# Patient Record
Sex: Female | Born: 1955 | Race: White | Hispanic: No | Marital: Married | Smoking: Never smoker
Health system: Southern US, Community
[De-identification: ages and names within clinical notes are randomized; demographics above are authoritative.]

## PROBLEM LIST (undated history)

## (undated) DIAGNOSIS — K219 Gastro-esophageal reflux disease without esophagitis: Secondary | ICD-10-CM

## (undated) DIAGNOSIS — K5790 Diverticulosis of intestine, part unspecified, without perforation or abscess without bleeding: Secondary | ICD-10-CM

## (undated) DIAGNOSIS — M1990 Unspecified osteoarthritis, unspecified site: Secondary | ICD-10-CM

## (undated) DIAGNOSIS — D759 Disease of blood and blood-forming organs, unspecified: Secondary | ICD-10-CM

## (undated) DIAGNOSIS — C8590 Non-Hodgkin lymphoma, unspecified, unspecified site: Secondary | ICD-10-CM

## (undated) DIAGNOSIS — C801 Malignant (primary) neoplasm, unspecified: Secondary | ICD-10-CM

## (undated) DIAGNOSIS — I82C11 Acute embolism and thrombosis of right internal jugular vein: Secondary | ICD-10-CM

## (undated) DIAGNOSIS — E663 Overweight: Secondary | ICD-10-CM

## (undated) DIAGNOSIS — K2970 Gastritis, unspecified, without bleeding: Secondary | ICD-10-CM

## (undated) DIAGNOSIS — R5383 Other fatigue: Secondary | ICD-10-CM

## (undated) DIAGNOSIS — J392 Other diseases of pharynx: Secondary | ICD-10-CM

## (undated) DIAGNOSIS — R002 Palpitations: Secondary | ICD-10-CM

## (undated) DIAGNOSIS — D696 Thrombocytopenia, unspecified: Secondary | ICD-10-CM

## (undated) DIAGNOSIS — I499 Cardiac arrhythmia, unspecified: Secondary | ICD-10-CM

## (undated) DIAGNOSIS — R079 Chest pain, unspecified: Secondary | ICD-10-CM

## (undated) DIAGNOSIS — K209 Esophagitis, unspecified: Secondary | ICD-10-CM

## (undated) DIAGNOSIS — R252 Cramp and spasm: Secondary | ICD-10-CM

## (undated) DIAGNOSIS — R61 Generalized hyperhidrosis: Secondary | ICD-10-CM

## (undated) DIAGNOSIS — IMO0001 Reserved for inherently not codable concepts without codable children: Secondary | ICD-10-CM

## (undated) DIAGNOSIS — R000 Tachycardia, unspecified: Secondary | ICD-10-CM

## (undated) HISTORY — DX: Overweight: E66.3

## (undated) HISTORY — DX: Gastritis, unspecified, without bleeding: K29.70

## (undated) HISTORY — DX: Gastro-esophageal reflux disease without esophagitis: K21.9

## (undated) HISTORY — PX: PORTACATH PLACEMENT: SHX2246

## (undated) HISTORY — DX: Thrombocytopenia, unspecified: D69.6

## (undated) HISTORY — DX: Chest pain, unspecified: R07.9

## (undated) HISTORY — DX: Tachycardia, unspecified: R00.0

## (undated) HISTORY — DX: Generalized hyperhidrosis: R61

## (undated) HISTORY — DX: Palpitations: R00.2

## (undated) HISTORY — DX: Other fatigue: R53.83

## (undated) HISTORY — DX: Esophagitis, unspecified: K20.9

## (undated) HISTORY — DX: Malignant (primary) neoplasm, unspecified: C80.1

## (undated) HISTORY — DX: Non-Hodgkin lymphoma, unspecified, unspecified site: C85.90

## (undated) HISTORY — DX: Other diseases of pharynx: J39.2

## (undated) HISTORY — DX: Diverticulosis of intestine, part unspecified, without perforation or abscess without bleeding: K57.90

## (undated) HISTORY — DX: Cramp and spasm: R25.2

---
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
## 1995-05-15 HISTORY — PX: CHOLECYSTECTOMY: SHX55

## 2003-05-15 HISTORY — PX: ABDOMINAL HYSTERECTOMY: SHX81

## 2004-03-15 ENCOUNTER — Ambulatory Visit: Payer: Self-pay | Admitting: Unknown Physician Specialty

## 2004-03-29 ENCOUNTER — Ambulatory Visit: Payer: Self-pay | Admitting: Unknown Physician Specialty

## 2004-04-04 ENCOUNTER — Ambulatory Visit: Payer: Self-pay | Admitting: Unknown Physician Specialty

## 2004-04-12 ENCOUNTER — Ambulatory Visit: Payer: Self-pay | Admitting: General Surgery

## 2004-05-16 ENCOUNTER — Ambulatory Visit: Payer: Self-pay | Admitting: Unknown Physician Specialty

## 2004-06-22 ENCOUNTER — Ambulatory Visit: Payer: Self-pay | Admitting: Unknown Physician Specialty

## 2005-02-05 ENCOUNTER — Ambulatory Visit: Payer: Self-pay | Admitting: Unknown Physician Specialty

## 2005-05-23 ENCOUNTER — Ambulatory Visit: Payer: Self-pay | Admitting: Gastroenterology

## 2005-06-13 ENCOUNTER — Ambulatory Visit: Payer: Self-pay | Admitting: Gastroenterology

## 2006-04-22 ENCOUNTER — Ambulatory Visit: Payer: Self-pay | Admitting: Cardiology

## 2006-05-17 ENCOUNTER — Ambulatory Visit: Payer: Self-pay | Admitting: Unknown Physician Specialty

## 2006-05-23 ENCOUNTER — Ambulatory Visit: Payer: Self-pay | Admitting: Cardiology

## 2006-08-20 ENCOUNTER — Ambulatory Visit: Payer: Self-pay | Admitting: Cardiology

## 2007-05-28 ENCOUNTER — Ambulatory Visit: Payer: Self-pay | Admitting: Unknown Physician Specialty

## 2007-06-02 ENCOUNTER — Ambulatory Visit: Payer: Self-pay | Admitting: Unknown Physician Specialty

## 2007-12-15 ENCOUNTER — Ambulatory Visit: Payer: Self-pay | Admitting: Unknown Physician Specialty

## 2008-06-25 ENCOUNTER — Ambulatory Visit: Payer: Self-pay | Admitting: Unknown Physician Specialty

## 2008-10-19 DIAGNOSIS — R0989 Other specified symptoms and signs involving the circulatory and respiratory systems: Secondary | ICD-10-CM | POA: Insufficient documentation

## 2008-10-19 DIAGNOSIS — R079 Chest pain, unspecified: Secondary | ICD-10-CM | POA: Insufficient documentation

## 2008-10-19 DIAGNOSIS — Z6830 Body mass index (BMI) 30.0-30.9, adult: Secondary | ICD-10-CM | POA: Insufficient documentation

## 2008-10-19 DIAGNOSIS — R002 Palpitations: Secondary | ICD-10-CM | POA: Insufficient documentation

## 2008-10-19 DIAGNOSIS — E663 Overweight: Secondary | ICD-10-CM | POA: Insufficient documentation

## 2009-06-30 ENCOUNTER — Ambulatory Visit: Payer: Self-pay | Admitting: Internal Medicine

## 2010-06-29 LAB — CBC AND DIFFERENTIAL
HCT: 40 % (ref 36–46)
Hemoglobin: 13.9 g/dL (ref 12.0–16.0)
Neutrophils Absolute: 4 /uL
Platelets: 246 10*3/uL (ref 150–399)
WBC: 6.9 10^3/mL

## 2010-06-29 LAB — BASIC METABOLIC PANEL: Potassium: 4.6 mmol/L (ref 3.4–5.3)

## 2010-06-29 LAB — LIPID PANEL
Cholesterol: 237 mg/dL — AB (ref 0–200)
HDL: 66 mg/dL (ref 35–70)
LDL Cholesterol: 150 mg/dL

## 2010-06-29 LAB — HEPATIC FUNCTION PANEL: ALT: 16 U/L (ref 7–35)

## 2010-07-11 ENCOUNTER — Ambulatory Visit: Payer: Self-pay | Admitting: Internal Medicine

## 2010-07-12 ENCOUNTER — Ambulatory Visit: Payer: Self-pay | Admitting: Internal Medicine

## 2010-10-13 LAB — HM MAMMOGRAPHY

## 2010-11-02 ENCOUNTER — Encounter: Payer: Self-pay | Admitting: Cardiovascular Disease

## 2011-07-06 ENCOUNTER — Encounter: Payer: Self-pay | Admitting: Internal Medicine

## 2011-07-06 DIAGNOSIS — E785 Hyperlipidemia, unspecified: Secondary | ICD-10-CM

## 2011-07-06 DIAGNOSIS — M899 Disorder of bone, unspecified: Secondary | ICD-10-CM | POA: Insufficient documentation

## 2011-07-06 DIAGNOSIS — M8580 Other specified disorders of bone density and structure, unspecified site: Secondary | ICD-10-CM | POA: Insufficient documentation

## 2011-07-06 DIAGNOSIS — E559 Vitamin D deficiency, unspecified: Secondary | ICD-10-CM | POA: Insufficient documentation

## 2011-07-06 DIAGNOSIS — M810 Age-related osteoporosis without current pathological fracture: Secondary | ICD-10-CM | POA: Insufficient documentation

## 2011-07-10 ENCOUNTER — Encounter: Payer: Self-pay | Admitting: Internal Medicine

## 2011-07-10 ENCOUNTER — Ambulatory Visit (INDEPENDENT_AMBULATORY_CARE_PROVIDER_SITE_OTHER): Payer: BC Managed Care – PPO | Admitting: Internal Medicine

## 2011-07-10 ENCOUNTER — Other Ambulatory Visit (HOSPITAL_COMMUNITY)
Admission: RE | Admit: 2011-07-10 | Discharge: 2011-07-10 | Disposition: A | Discharge status: Home / Self Care | Payer: BC Managed Care – PPO | Source: Ambulatory Visit | Attending: Internal Medicine | Admitting: Internal Medicine

## 2011-07-10 DIAGNOSIS — M1990 Unspecified osteoarthritis, unspecified site: Secondary | ICD-10-CM

## 2011-07-10 DIAGNOSIS — D239 Other benign neoplasm of skin, unspecified: Secondary | ICD-10-CM

## 2011-07-10 DIAGNOSIS — Z1159 Encounter for screening for other viral diseases: Secondary | ICD-10-CM | POA: Insufficient documentation

## 2011-07-10 DIAGNOSIS — Z01419 Encounter for gynecological examination (general) (routine) without abnormal findings: Secondary | ICD-10-CM | POA: Insufficient documentation

## 2011-07-10 DIAGNOSIS — H9312 Tinnitus, left ear: Secondary | ICD-10-CM

## 2011-07-10 DIAGNOSIS — Z1239 Encounter for other screening for malignant neoplasm of breast: Secondary | ICD-10-CM

## 2011-07-10 DIAGNOSIS — D229 Melanocytic nevi, unspecified: Secondary | ICD-10-CM

## 2011-07-10 DIAGNOSIS — H9319 Tinnitus, unspecified ear: Secondary | ICD-10-CM

## 2011-07-10 DIAGNOSIS — Z0000 Encounter for general adult medical examination without abnormal findings: Secondary | ICD-10-CM | POA: Insufficient documentation

## 2011-07-10 DIAGNOSIS — E663 Overweight: Secondary | ICD-10-CM

## 2011-07-10 LAB — CBC WITH DIFFERENTIAL/PLATELET
Basophils Absolute: 0 10*3/uL (ref 0.0–0.1)
Eosinophils Absolute: 0.1 10*3/uL (ref 0.0–0.7)
HCT: 41.1 % (ref 36.0–46.0)
Hemoglobin: 13.9 g/dL (ref 12.0–15.0)
Lymphs Abs: 2.1 10*3/uL (ref 0.7–4.0)
MCHC: 33.8 g/dL (ref 30.0–36.0)
Monocytes Absolute: 0.5 10*3/uL (ref 0.1–1.0)
Neutro Abs: 3.6 10*3/uL (ref 1.4–7.7)
RDW: 13.3 % (ref 11.5–14.6)

## 2011-07-10 LAB — COMPREHENSIVE METABOLIC PANEL
ALT: 17 U/L (ref 0–35)
AST: 26 U/L (ref 0–37)
Chloride: 102 mEq/L (ref 96–112)
Creatinine, Ser: 0.8 mg/dL (ref 0.4–1.2)
Sodium: 139 mEq/L (ref 135–145)
Total Bilirubin: 1 mg/dL (ref 0.3–1.2)

## 2011-07-10 MED ORDER — MELOXICAM 15 MG PO TABS: 15.0000 mg | ORAL_TABLET | Freq: Every day | ORAL | Status: AC

## 2011-07-10 NOTE — Assessment & Plan Note (Signed)
Will set up ENT evaluation of hearing.

## 2011-07-10 NOTE — Assessment & Plan Note (Signed)
Encouraged continued efforts at healthy diet and regular exercise with goal of 30-69min most days of week.

## 2011-07-10 NOTE — Assessment & Plan Note (Addendum)
Intranasal. Will set up with ENT for biopsy as this appears to be new lesion.

## 2011-07-10 NOTE — Assessment & Plan Note (Signed)
Mammogram ordered

## 2011-07-10 NOTE — Assessment & Plan Note (Signed)
Exam normal today. PAP pending. Mammogram ordered. Labs including CBC, CMP, lipids ordered. Follow up 1 year and prn.

## 2011-07-10 NOTE — Progress Notes (Signed)
Subjective:    Patient ID: Stacy Murillo, female    DOB: 1955/08/11, 56 y.o.   MRN: [id number]  HPI 56 year old female presents for annual exam. She has several concerns today. First, she reports several week history of ringing in her left ear. She notes that she was treated for an ear infection earlier this fall. She denies any pain in her left ear. She does note some decreased hearing. She has not been evaluated for this. The ringing is constant. She has not started any new medications.  She is also concerned today about a mole that she has noticed inside her left nasal passage. She first noticed this a few weeks ago. She is not sure how long it has been present. It is not painful. It has not changed.  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 This has limited her ability to exercise. She continues to try to exercise on a daily basis. She is also trying to lose weight. She reports she has limited her intake of high calorie, high fat, and high sugar foods.  Outpatient Encounter Prescriptions as of 07/10/2011  Medication Sig Dispense Refill  . Calcium-Vitamin D-Vitamin K (CALCIUM + D) [phone] MG-UNT-MCG CHEW Chew by mouth 2 (two) times daily.      Marland Kitchen KRILL OIL PO Take by mouth.      . Misc Natural Products (OSTEO BI-FLEX JOINT SHIELD PO) Take by mouth daily.      . Multiple Vitamins-Minerals (MULTIVITAMIN WITH MINERALS) tablet Take 1 tablet by mouth daily. Hair, Skin & nails      . meloxicam (MOBIC) 15 MG tablet Take 1 tablet (15 mg total) by mouth daily.  30 tablet  3    Review of Systems  Constitutional: Negative for fever, chills, appetite change, fatigue and unexpected weight change.  HENT: Positive for hearing loss and tinnitus. Negative for ear pain, congestion, sore throat, trouble swallowing, neck pain, voice change and sinus pressure.   Eyes: Negative for visual disturbance.  Respiratory: Negative for cough, shortness of breath, wheezing and  stridor.   Cardiovascular: Negative for chest pain, palpitations and leg swelling.  Gastrointestinal: Negative for nausea, vomiting, abdominal pain, diarrhea, constipation, blood in stool, abdominal distention and anal bleeding.  Genitourinary: Negative for dysuria and flank pain.  Musculoskeletal: Negative for myalgias, arthralgias and gait problem.  Skin: Positive for color change. Negative for rash.  Neurological: Negative for dizziness and headaches.  Hematological: Negative for adenopathy. Does not bruise/bleed easily.  Psychiatric/Behavioral: Negative for suicidal ideas, sleep disturbance and dysphoric mood. The patient is not nervous/anxious.    BP 140/78  Pulse 86  Temp(Src) 98.1 F (36.7 C) (Oral)  Ht 5' 3.5" (1.613 m)  Wt 199 lb (90.266 kg)  BMI 34.70 kg/m2  SpO2 99%     Objective:   Physical Exam  Constitutional: She is oriented to person, place, and time. She appears well-developed and well-nourished. No distress.  HENT:  Head: Normocephalic and atraumatic.  Right Ear: External ear normal.  Left Ear: External ear normal.  Nose: Nose normal.    Mouth/Throat: Oropharynx is clear and moist. No oropharyngeal exudate.  Eyes: Conjunctivae are normal. Pupils are equal, round, and reactive to light. Right eye exhibits no discharge. Left eye exhibits no discharge. No scleral icterus.  Neck: Normal range of motion. Neck supple. No tracheal deviation present. No thyromegaly present.  Cardiovascular: Normal rate, regular rhythm, normal heart sounds and intact distal pulses.  Exam reveals no gallop and no friction  rub.   No murmur heard. Pulmonary/Chest: Effort normal and breath sounds normal. No respiratory distress. She has no wheezes. She has no rales. She exhibits no tenderness.  Abdominal: Soft. Bowel sounds are normal. She exhibits no distension and no mass. There is no tenderness. There is no rebound and no guarding.  Genitourinary: Vagina normal. No breast swelling,  tenderness, discharge or bleeding. Pelvic exam was performed with patient prone. There is no rash, tenderness or lesion on the right labia. There is no rash, tenderness or lesion on the left labia. Right adnexum displays no mass, no tenderness and no fullness. Left adnexum displays no mass, no tenderness and no fullness. No erythema or tenderness around the vagina. No vaginal discharge found.       Vaginal cuff present, uterus surgically absent. Mild erythema vaginal walls noted.  Musculoskeletal: Normal range of motion. She exhibits no edema and no tenderness.  Lymphadenopathy:    She has no cervical adenopathy.  Neurological: She is alert and oriented to person, place, and time. No cranial nerve deficit. She exhibits normal muscle tone. Coordination normal.  Skin: Skin is warm and dry. No rash noted. She is not diaphoretic. No erythema. No pallor.  Psychiatric: She has a normal mood and affect. Her behavior is normal. Judgment and thought content normal.          Assessment & Plan:

## 2011-07-20 ENCOUNTER — Encounter: Payer: Self-pay | Admitting: Internal Medicine

## 2011-08-21 ENCOUNTER — Ambulatory Visit: Payer: Self-pay | Admitting: Internal Medicine

## 2011-09-05 ENCOUNTER — Encounter: Payer: Self-pay | Admitting: Internal Medicine

## 2012-06-28 ENCOUNTER — Other Ambulatory Visit: Payer: Self-pay

## 2012-07-17 ENCOUNTER — Encounter: Payer: Self-pay | Admitting: Internal Medicine

## 2012-07-17 ENCOUNTER — Ambulatory Visit (INDEPENDENT_AMBULATORY_CARE_PROVIDER_SITE_OTHER): Payer: No Typology Code available for payment source | Admitting: Internal Medicine

## 2012-07-17 VITALS — BP 142/82 | HR 68 | Temp 98.0°F | Ht 63.0 in | Wt 192.0 lb

## 2012-07-17 DIAGNOSIS — Z0000 Encounter for general adult medical examination without abnormal findings: Secondary | ICD-10-CM

## 2012-07-17 LAB — LDL CHOLESTEROL, DIRECT: Direct LDL: 139.5 mg/dL

## 2012-07-17 LAB — COMPREHENSIVE METABOLIC PANEL
AST: 21 U/L (ref 0–37)
Alkaline Phosphatase: 58 U/L (ref 39–117)
BUN: 11 mg/dL (ref 6–23)
Calcium: 9.4 mg/dL (ref 8.4–10.5)
Creatinine, Ser: 0.9 mg/dL (ref 0.4–1.2)

## 2012-07-17 LAB — CBC WITH DIFFERENTIAL/PLATELET
Basophils Absolute: 0 10*3/uL (ref 0.0–0.1)
Eosinophils Absolute: 0.1 10*3/uL (ref 0.0–0.7)
HCT: 38.4 % (ref 36.0–46.0)
Lymphs Abs: 2.3 10*3/uL (ref 0.7–4.0)
MCHC: 34.7 g/dL (ref 30.0–36.0)
Monocytes Relative: 6.2 % (ref 3.0–12.0)
Platelets: 218 10*3/uL (ref 150.0–400.0)
RDW: 13.2 % (ref 11.5–14.6)

## 2012-07-17 LAB — LIPID PANEL
Cholesterol: 223 mg/dL — ABNORMAL HIGH (ref 0–200)
Total CHOL/HDL Ratio: 5
Triglycerides: 138 mg/dL (ref 0.0–149.0)
VLDL: 27.6 mg/dL (ref 0.0–40.0)

## 2012-07-17 LAB — TSH: TSH: 2.41 u[IU]/mL (ref 0.35–5.50)

## 2012-07-17 NOTE — Progress Notes (Signed)
Subjective:    Patient ID: Stacy Murillo, female    DOB: [birth year], 57 y.o.   MRN: [id number]  HPI 57YO female presents for annual exam. She reports she is generally feeling well. She continues to have tinnitis. She had hearing evaluation with ENT and reports that hearing was normal. She reports she is trying to follow a healthy diet and regular physical activity. She denies any new concerns today.  Outpatient Encounter Prescriptions as of 07/17/2012  Medication Sig Dispense Refill  . Calcium-Vitamin D-Vitamin K (CALCIUM + D) [phone] MG-UNT-MCG CHEW Chew by mouth 2 (two) times daily.      . Multiple Vitamins-Minerals (HAIR/SKIN/NAILS) TABS Take by mouth.      Marland Kitchen KRILL OIL PO Take by mouth.      . Misc Natural Products (OSTEO BI-FLEX JOINT SHIELD PO) Take by mouth daily.      . Multiple Vitamins-Minerals (MULTIVITAMIN WITH MINERALS) tablet Take 1 tablet by mouth daily. Hair, Skin & nails       No facility-administered encounter medications on file as of 07/17/2012.   BP 142/82  Pulse 68  Temp(Src) 98 F (36.7 C) (Oral)  Ht 5\' 3"  (1.6 m)  Wt 192 lb (87.091 kg)  BMI 34.02 kg/m2  SpO2 97%  Review of Systems  Constitutional: Negative for fever, chills, appetite change, fatigue and unexpected weight change.  HENT: Positive for tinnitus. Negative for ear pain, congestion, sore throat, trouble swallowing, neck pain, voice change and sinus pressure.   Eyes: Negative for visual disturbance.  Respiratory: Negative for cough, shortness of breath, wheezing and stridor.   Cardiovascular: Negative for chest pain, palpitations and leg swelling.  Gastrointestinal: Negative for nausea, vomiting, abdominal pain, diarrhea, constipation, blood in stool, abdominal distention and anal bleeding.  Genitourinary: Negative for dysuria and flank pain.  Musculoskeletal: Negative for myalgias, arthralgias and gait problem.  Skin: Negative for color change and rash.  Neurological: Negative for dizziness and  headaches.  Hematological: Negative for adenopathy. Does not bruise/bleed easily.  Psychiatric/Behavioral: Negative for suicidal ideas, sleep disturbance and dysphoric mood. The patient is not nervous/anxious.        Objective:   Physical Exam  Constitutional: She is oriented to person, place, and time. She appears well-developed and well-nourished. No distress.  HENT:  Head: Normocephalic and atraumatic.  Right Ear: External ear normal.  Left Ear: External ear normal.  Nose: Nose normal.  Mouth/Throat: Oropharynx is clear and moist. No oropharyngeal exudate.  Eyes: Conjunctivae are normal. Pupils are equal, round, and reactive to light. Right eye exhibits no discharge. Left eye exhibits no discharge. No scleral icterus.  Neck: Normal range of motion. Neck supple. No tracheal deviation present. No thyromegaly present.  Cardiovascular: Normal rate, regular rhythm, normal heart sounds and intact distal pulses.  Exam reveals no gallop and no friction rub.   No murmur heard. Pulmonary/Chest: Effort normal and breath sounds normal. No respiratory distress. She has no wheezes. She has no rales. She exhibits no tenderness.  Abdominal: Soft. Bowel sounds are normal. She exhibits no distension and no mass. There is no tenderness. There is no rebound and no guarding.  Genitourinary: Rectum normal.  Musculoskeletal: Normal range of motion. She exhibits no edema and no tenderness.  Lymphadenopathy:    She has no cervical adenopathy.  Neurological: She is alert and oriented to person, place, and time. No cranial nerve deficit. She exhibits normal muscle tone. Coordination normal.  Skin: Skin is warm and dry. No rash noted. She is not diaphoretic. No  erythema. No pallor.  Psychiatric: She has a normal mood and affect. Her behavior is normal. Judgment and thought content normal.          Assessment & Plan:

## 2012-07-17 NOTE — Assessment & Plan Note (Signed)
General medical exam normal today including breast exam. PAP deferred as normal 06/2011. Plan repeat PAP in 06/2016.  Will schedule mammogram. Other health maintenance UTD. Encouraged continued efforts at healthy diet and regular physical activity. Will repeat labs today including CBC, CMP, lipids. Follow up 1 year and prn.

## 2012-07-18 LAB — VITAMIN D 25 HYDROXY (VIT D DEFICIENCY, FRACTURES): Vit D, 25-Hydroxy: 37 ng/mL (ref 30–89)

## 2012-08-21 ENCOUNTER — Ambulatory Visit: Payer: Self-pay | Admitting: Internal Medicine

## 2012-09-11 ENCOUNTER — Encounter: Payer: Self-pay | Admitting: Internal Medicine

## 2013-03-19 ENCOUNTER — Other Ambulatory Visit: Payer: Self-pay

## 2013-07-23 ENCOUNTER — Encounter: Payer: Self-pay | Admitting: *Deleted

## 2013-08-11 ENCOUNTER — Encounter: Payer: Self-pay | Admitting: Internal Medicine

## 2013-08-11 ENCOUNTER — Ambulatory Visit (INDEPENDENT_AMBULATORY_CARE_PROVIDER_SITE_OTHER): Payer: No Typology Code available for payment source | Admitting: Internal Medicine

## 2013-08-11 VITALS — BP 140/80 | HR 72 | Temp 98.3°F | Ht 63.0 in | Wt 197.0 lb

## 2013-08-11 DIAGNOSIS — Z0000 Encounter for general adult medical examination without abnormal findings: Secondary | ICD-10-CM

## 2013-08-11 LAB — VITAMIN D 25 HYDROXY (VIT D DEFICIENCY, FRACTURES): Vit D, 25-Hydroxy: 42 ng/mL (ref 30–89)

## 2013-08-11 NOTE — Progress Notes (Signed)
Subjective:    Patient ID: Stacy Murillo, female    DOB: June 17, 1955, 58 y.o.   MRN: [id number]  HPI 58YO female presents for annual exam. Generally feeling well.  Notes some recent issues with left knee pain. Was seen by ortho and had cortisone injections x2 with no improvement. Plans to follow up with them.  For this reason, exercise has been limited recently. Still trying to follow healthy diet.    Review of Systems  Constitutional: Negative for fever, chills, appetite change, fatigue and unexpected weight change.  HENT: Negative for congestion, ear pain, sinus pressure, sore throat, trouble swallowing and voice change.   Eyes: Negative for visual disturbance.  Respiratory: Negative for cough, shortness of breath, wheezing and stridor.   Cardiovascular: Negative for chest pain, palpitations and leg swelling.  Gastrointestinal: Negative for nausea, vomiting, abdominal pain, diarrhea, constipation, blood in stool, abdominal distention and anal bleeding.  Genitourinary: Negative for dysuria and flank pain.  Musculoskeletal: Positive for arthralgias. Negative for gait problem, myalgias and neck pain.  Skin: Negative for color change and rash.  Neurological: Negative for dizziness and headaches.  Hematological: Negative for adenopathy. Does not bruise/bleed easily.  Psychiatric/Behavioral: Negative for suicidal ideas, sleep disturbance and dysphoric mood. The patient is not nervous/anxious.        Objective:    BP 140/80  Pulse 72  Temp(Src) 98.3 F (36.8 C) (Oral)  Ht 5\' 3"  (1.6 m)  Wt 197 lb (89.359 kg)  BMI 34.91 kg/m2  SpO2 97% Physical Exam  Constitutional: She is oriented to person, place, and time. She appears well-developed and well-nourished. No distress.  HENT:  Head: Normocephalic and atraumatic.  Right Ear: External ear normal.  Left Ear: External ear normal.  Nose: Nose normal.  Mouth/Throat: Oropharynx is clear and moist. No oropharyngeal exudate.  Eyes:  Conjunctivae are normal. Pupils are equal, round, and reactive to light. Right eye exhibits no discharge. Left eye exhibits no discharge. No scleral icterus.  Neck: Normal range of motion. Neck supple. No tracheal deviation present. No thyromegaly present.  Cardiovascular: Normal rate, regular rhythm, normal heart sounds and intact distal pulses.  Exam reveals no gallop and no friction rub.   No murmur heard. Pulmonary/Chest: Effort normal and breath sounds normal. No accessory muscle usage. Not tachypneic. No respiratory distress. She has no decreased breath sounds. She has no wheezes. She has no rales. She exhibits no tenderness. Right breast exhibits no inverted nipple, no mass, no nipple discharge, no skin change and no tenderness. Left breast exhibits no inverted nipple, no mass, no nipple discharge, no skin change and no tenderness. Breasts are symmetrical.  Abdominal: Soft. Bowel sounds are normal. She exhibits no distension and no mass. There is no tenderness. There is no rebound and no guarding.  Musculoskeletal: Normal range of motion. She exhibits no edema and no tenderness.  Lymphadenopathy:    She has no cervical adenopathy.  Neurological: She is alert and oriented to person, place, and time. No cranial nerve deficit. She exhibits normal muscle tone. Coordination normal.  Skin: Skin is warm and dry. No rash noted. She is not diaphoretic. No erythema. No pallor.  Psychiatric: She has a normal mood and affect. Her behavior is normal. Judgment and thought content normal.          Assessment & Plan:   Problem List Items Addressed This Visit   Routine general medical examination at a health care facility - Primary     General medical exam including breast  exam normal today. Pap and pelvic deferred as Pap normal, HPV negative in 2013. Plan repeat Pap in 2016. Mammogram is scheduled. Colonoscopy is up-to-date. Encouraged healthy diet and regular physical activity. Immunizations are up to  date.    Relevant Orders      CBC with Differential      Comprehensive metabolic panel      Lipid panel      Microalbumin / creatinine urine ratio      Vit D  25 hydroxy (rtn osteoporosis monitoring)       Return in about 1 year (around 08/12/2014) for Physical.

## 2013-08-11 NOTE — Progress Notes (Signed)
Pre visit review using our clinic review tool, if applicable. No additional management support is needed unless otherwise documented below in the visit note. 

## 2013-08-11 NOTE — Assessment & Plan Note (Signed)
General medical exam including breast exam normal today. Pap and pelvic deferred as Pap normal, HPV negative in 2013. Plan repeat Pap in 2016. Mammogram is scheduled. Colonoscopy is up-to-date. Encouraged healthy diet and regular physical activity. Immunizations are up to date.

## 2013-08-12 LAB — CBC WITH DIFFERENTIAL/PLATELET
BASOS PCT: 0.1 % (ref 0.0–3.0)
Basophils Absolute: 0 10*3/uL (ref 0.0–0.1)
Eosinophils Absolute: 0.1 10*3/uL (ref 0.0–0.7)
Eosinophils Relative: 1.4 % (ref 0.0–5.0)
HEMATOCRIT: 38.4 % (ref 36.0–46.0)
HEMOGLOBIN: 13.5 g/dL (ref 12.0–15.0)
LYMPHS ABS: 2.5 10*3/uL (ref 0.7–4.0)
Lymphocytes Relative: 30.2 % (ref 12.0–46.0)
MCHC: 35.2 g/dL (ref 30.0–36.0)
MCV: 88.3 fl (ref 78.0–100.0)
MONO ABS: 0.5 10*3/uL (ref 0.1–1.0)
Monocytes Relative: 6.1 % (ref 3.0–12.0)
Neutro Abs: 5.2 10*3/uL (ref 1.4–7.7)
Neutrophils Relative %: 62.2 % (ref 43.0–77.0)
Platelets: 261 10*3/uL (ref 150.0–400.0)
RBC: 4.35 Mil/uL (ref 3.87–5.11)
RDW: 13.2 % (ref 11.5–14.6)
WBC: 8.4 10*3/uL (ref 4.5–10.5)

## 2013-08-12 LAB — COMPREHENSIVE METABOLIC PANEL
ALBUMIN: 4.4 g/dL (ref 3.5–5.2)
ALK PHOS: 87 U/L (ref 39–117)
ALT: 52 U/L — ABNORMAL HIGH (ref 0–35)
AST: 27 U/L (ref 0–37)
BILIRUBIN TOTAL: 0.7 mg/dL (ref 0.3–1.2)
BUN: 7 mg/dL (ref 6–23)
CO2: 27 mEq/L (ref 19–32)
Calcium: 9.4 mg/dL (ref 8.4–10.5)
Chloride: 103 mEq/L (ref 96–112)
Creatinine, Ser: 0.8 mg/dL (ref 0.4–1.2)
GFR: 77.98 mL/min (ref >60.00)
GLUCOSE: 79 mg/dL (ref 70–99)
Potassium: 4.1 mEq/L (ref 3.5–5.1)
Sodium: 140 mEq/L (ref 135–145)
Total Protein: 7.1 g/dL (ref 6.0–8.3)

## 2013-08-12 LAB — LIPID PANEL
CHOLESTEROL: 213 mg/dL — AB (ref 0–200)
HDL: 46.6 mg/dL (ref >39.00)
LDL Cholesterol: 136 mg/dL — ABNORMAL HIGH (ref 0–99)
Total CHOL/HDL Ratio: 5
Triglycerides: 153 mg/dL — ABNORMAL HIGH (ref 0.0–149.0)
VLDL: 30.6 mg/dL (ref 0.0–40.0)

## 2013-08-12 LAB — MICROALBUMIN / CREATININE URINE RATIO
CREATININE, U: 233.5 mg/dL
MICROALB/CREAT RATIO: 0.2 mg/g (ref 0.0–30.0)
Microalb, Ur: 0.4 mg/dL (ref 0.0–1.9)

## 2013-08-24 ENCOUNTER — Ambulatory Visit: Payer: Self-pay | Admitting: Internal Medicine

## 2013-08-24 LAB — HM MAMMOGRAPHY

## 2013-08-25 ENCOUNTER — Telehealth: Payer: Self-pay | Admitting: Internal Medicine

## 2013-08-25 NOTE — Telephone Encounter (Signed)
Spoke with patient, she stated they have already called and it has been set up.

## 2013-08-25 NOTE — Telephone Encounter (Signed)
Radiologist recommended additional views on mammogram from 4/13 because of dense tissue. Has this been scheduled?

## 2013-08-28 ENCOUNTER — Ambulatory Visit: Payer: Self-pay | Admitting: Internal Medicine

## 2013-08-28 ENCOUNTER — Encounter: Payer: Self-pay | Admitting: *Deleted

## 2013-09-22 ENCOUNTER — Encounter: Payer: Self-pay | Admitting: Internal Medicine

## 2013-09-24 ENCOUNTER — Encounter: Payer: Self-pay | Admitting: Internal Medicine

## 2014-03-24 ENCOUNTER — Telehealth: Payer: Self-pay | Admitting: Internal Medicine

## 2014-03-24 NOTE — Telephone Encounter (Signed)
Please advise 

## 2014-03-24 NOTE — Telephone Encounter (Signed)
Stacy Murillo called wondering if she can get an appt with Dr. Gilford Rile. She's been having very bad heartburn for one month now. She went to [facility] but was told she'd need a referral in order to be seen there. Please call the pt to schedule an appt if possible.  Pt Home # [phone] / Cell # [phone] Thank you.

## 2014-03-25 NOTE — Telephone Encounter (Signed)
Please schedule appt, I already notified pt

## 2014-03-25 NOTE — Telephone Encounter (Signed)
Monday at 3:30 

## 2014-04-01 ENCOUNTER — Ambulatory Visit: Payer: No Typology Code available for payment source | Admitting: Internal Medicine

## 2014-04-06 ENCOUNTER — Ambulatory Visit (INDEPENDENT_AMBULATORY_CARE_PROVIDER_SITE_OTHER): Payer: No Typology Code available for payment source | Admitting: Internal Medicine

## 2014-04-06 ENCOUNTER — Encounter: Payer: Self-pay | Admitting: Internal Medicine

## 2014-04-06 VITALS — BP 130/80 | HR 72 | Temp 98.0°F | Ht 63.0 in | Wt 192.2 lb

## 2014-04-06 DIAGNOSIS — Z1211 Encounter for screening for malignant neoplasm of colon: Secondary | ICD-10-CM

## 2014-04-06 DIAGNOSIS — R945 Abnormal results of liver function studies: Secondary | ICD-10-CM

## 2014-04-06 DIAGNOSIS — K219 Gastro-esophageal reflux disease without esophagitis: Secondary | ICD-10-CM

## 2014-04-06 DIAGNOSIS — R7989 Other specified abnormal findings of blood chemistry: Secondary | ICD-10-CM

## 2014-04-06 DIAGNOSIS — Z23 Encounter for immunization: Secondary | ICD-10-CM

## 2014-04-06 LAB — COMPREHENSIVE METABOLIC PANEL
ALK PHOS: 74 U/L (ref 39–117)
ALT: 83 U/L — ABNORMAL HIGH (ref 0–35)
AST: 55 U/L — ABNORMAL HIGH (ref 0–37)
Albumin: 4.1 g/dL (ref 3.5–5.2)
BUN: 12 mg/dL (ref 6–23)
CHLORIDE: 102 meq/L (ref 96–112)
CO2: 30 mEq/L (ref 19–32)
Calcium: 9.4 mg/dL (ref 8.4–10.5)
Creatinine, Ser: 0.9 mg/dL (ref 0.4–1.2)
GFR: 70.63 mL/min (ref >60.00)
Glucose, Bld: 97 mg/dL (ref 70–99)
Potassium: 4.8 mEq/L (ref 3.5–5.1)
SODIUM: 141 meq/L (ref 135–145)
Total Bilirubin: 0.6 mg/dL (ref 0.2–1.2)
Total Protein: 6.8 g/dL (ref 6.0–8.3)

## 2014-04-06 LAB — LIPASE: Lipase: 27 U/L (ref 11.0–59.0)

## 2014-04-06 MED ORDER — PANTOPRAZOLE SODIUM 40 MG PO TBEC
40.0000 mg | DELAYED_RELEASE_TABLET | Freq: Every day | ORAL | Status: DC
Start: 2014-04-06 — End: 2014-08-18

## 2014-04-06 NOTE — Patient Instructions (Signed)
Stop Omeprazole.  Start Pantoprazole 40mg  daily.  We will set up referral to Dr. Gustavo Lah in GI for upper and lower endoscopy.  Follow up here in 3 months.

## 2014-04-06 NOTE — Assessment & Plan Note (Signed)
Referral placed to GI for screening colonoscopy

## 2014-04-06 NOTE — Addendum Note (Signed)
Addended by: Ronette Deter A on: 04/06/2014 11:58 AM   Modules accepted: Orders

## 2014-04-06 NOTE — Progress Notes (Signed)
Pre visit review using our clinic review tool, if applicable. No additional management support is needed unless otherwise documented below in the visit note. 

## 2014-04-06 NOTE — Progress Notes (Signed)
Subjective:    Patient ID: Stacy Murillo, female    DOB: 12-Aug-1955, 58 y.o.   MRN: [id number]  HPI 58YO female presents for acute visit.  GERD - Stopped taking Omeprazole. Symptoms of acid reflux much worse with burning pain in chest and throat. Restarted Omeprazole and symptoms improved, however has some diarrhea on this medication. Reports that stool is "yellow" in color. Not bloody. No mucous noted. No abdominal pain or nausea. She is s/p cholecystectomy.   Review of Systems  Constitutional: Negative for fever, chills, appetite change, fatigue and unexpected weight change.  Eyes: Negative for visual disturbance.  Respiratory: Negative for shortness of breath.   Cardiovascular: Negative for chest pain and leg swelling.  Gastrointestinal: Positive for diarrhea. Negative for nausea, vomiting, abdominal pain and blood in stool.  Skin: Negative for color change and rash.  Hematological: Negative for adenopathy. Does not bruise/bleed easily.  Psychiatric/Behavioral: Negative for dysphoric mood. The patient is not nervous/anxious.        Objective:    BP 130/80 mmHg  Pulse 72  Temp(Src) 98 F (36.7 C) (Oral)  Ht 5\' 3"  (1.6 m)  Wt 192 lb 4 oz (87.204 kg)  BMI 34.06 kg/m2  SpO2 98% Physical Exam  Constitutional: She is oriented to person, place, and time. She appears well-developed and well-nourished. No distress.  HENT:  Head: Normocephalic and atraumatic.  Right Ear: External ear normal.  Left Ear: External ear normal.  Nose: Nose normal.  Mouth/Throat: Oropharynx is clear and moist. No oropharyngeal exudate.  Eyes: Conjunctivae and EOM are normal. Pupils are equal, round, and reactive to light. Right eye exhibits no discharge. Left eye exhibits no discharge. No scleral icterus.  Neck: Normal range of motion. Neck supple. No tracheal deviation present. No thyromegaly present.  Cardiovascular: Normal rate, regular rhythm, normal heart sounds and intact distal pulses.  Exam  reveals no gallop and no friction rub.   No murmur heard. Pulmonary/Chest: Effort normal and breath sounds normal. No accessory muscle usage. No tachypnea. No respiratory distress. She has no decreased breath sounds. She has no wheezes. She has no rhonchi. She has no rales. She exhibits no tenderness.  Abdominal: Soft. Bowel sounds are normal. She exhibits no distension and no mass. There is no tenderness. There is no rebound and no guarding.  Musculoskeletal: Normal range of motion. She exhibits no edema or tenderness.  Lymphadenopathy:    She has no cervical adenopathy.  Neurological: She is alert and oriented to person, place, and time. No cranial nerve deficit. She exhibits normal muscle tone. Coordination normal.  Skin: Skin is warm and dry. No rash noted. She is not diaphoretic. No erythema. No pallor.  Psychiatric: She has a normal mood and affect. Her behavior is normal. Judgment and thought content normal.          Assessment & Plan:   Problem List Items Addressed This Visit      Unprioritized   Esophageal reflux - Primary    Having some diarrhea with use of omeprazole. Will try change to Pantoprazole. Will set up GI evaluation for upper endoscopy. Follow up here in 55months and prn.    Relevant Medications      pantoprazole (PROTONIX) EC tablet   Other Relevant Orders      Comprehensive metabolic panel      Lipase   Special screening for malignant neoplasms, colon    Referral placed to GI for screening colonoscopy    Relevant Orders  Ambulatory referral to Gastroenterology       Return in about 3 months (around 07/07/2014).

## 2014-04-06 NOTE — Assessment & Plan Note (Signed)
Having some diarrhea with use of omeprazole. Will try change to Pantoprazole. Will set up GI evaluation for upper endoscopy. Follow up here in 32months and prn.

## 2014-04-14 ENCOUNTER — Ambulatory Visit: Payer: Self-pay | Admitting: Internal Medicine

## 2014-04-15 ENCOUNTER — Telehealth: Payer: Self-pay | Admitting: Internal Medicine

## 2014-04-15 NOTE — Telephone Encounter (Signed)
Right upper quadrant [place] was normal.

## 2014-04-16 NOTE — Telephone Encounter (Signed)
Left message for pt to return my call.

## 2014-04-16 NOTE — Telephone Encounter (Signed)
Pt notified of results

## 2014-04-27 ENCOUNTER — Encounter: Payer: Self-pay | Admitting: Internal Medicine

## 2014-05-31 ENCOUNTER — Encounter: Payer: Self-pay | Admitting: Sports Medicine

## 2014-05-31 ENCOUNTER — Ambulatory Visit (INDEPENDENT_AMBULATORY_CARE_PROVIDER_SITE_OTHER): Payer: No Typology Code available for payment source | Admitting: Sports Medicine

## 2014-05-31 VITALS — BP 150/75 | Ht 62.5 in | Wt 180.0 lb

## 2014-05-31 DIAGNOSIS — M25562 Pain in left knee: Secondary | ICD-10-CM

## 2014-05-31 DIAGNOSIS — M7661 Achilles tendinitis, right leg: Secondary | ICD-10-CM

## 2014-05-31 MED ORDER — METHYLPREDNISOLONE ACETATE 40 MG/ML IJ SUSP
40.0000 mg | Freq: Once | INTRAMUSCULAR | Status: AC
  Administered 2014-05-31: 40 mg via INTRA_ARTICULAR

## 2014-05-31 NOTE — Progress Notes (Signed)
   Subjective:    Patient ID: Stacy Murillo, female    DOB: 1955/07/11, 59 y.o.   MRN: [id number]  HPI chief complaint: Right heel and left knee pain  Very pleasant 59 year old female comes in with a couple of different complaints. She is complaining of 6 months of heel pain. She has been self treating herself as plantar fasciitis but the majority of her pain is along the posterior heel where she has also noticed some swelling. Pain is worse with first step. She has had similar issues in the past. She denies any pain on the plantar aspect of her foot. No numbness or tingling. She has had orthotics made in the past but they are very old. She is also complaining of left knee pain. She has had intermittent pain in the past. Pain is improved with cortisone injections. Her pain returned a few weeks ago when she was struck on the lateral aspect of her knee by dog. Pain is diffuse across the anterior knee. Intermittent swelling. Pain is sharp in quality. Improves some at rest. No prior knee surgeries. No recent x-rays. She is here today with her husband. Past medical history reviewed Medications reviewed Allergies reviewed    Review of Systems    as above Objective:   Physical Exam  Well-developed, well-nourished. No acute distress. Awake alert and oriented 3. Vital signs reviewed.  Right heel: There is marked thickening of the distal Achilles tendon. Tenderness to palpation here as well. Tight heel cord. No tenderness to palpation along the calcaneal insertion of the plantar fascia. Negative calcaneal squeeze. Fairly neutral arches. Neurovascular intact distally.  Left knee: Full range of motion. Trace effusion. She is tender to palpation along the medial joint line but a negative McMurray's. No tenderness laterally. Knee is stable to ligamentous exam. Neurovascular intact distally.  MSK ultrasound of the right Achilles tendon was performed. Achilles tendon is markedly thickened. It measures  almost a centimeter when measured in the longitudinal plane. I do not appreciate any calcifications nor do I see any discrete tears.      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 An anterior medial approach is utilized. Patient tolerated this without difficulty. If symptoms persist I would start with getting plain x-rays of this knee. I will give her a pair of green sports insoles with bilateral 5/16 inch heel lifts (her left Achilles tendon is also thickened and measures just over 0.7cm on ultrasound). She will start an exercise program consisting of Alfredson heel drops. Follow-up with me in 6 weeks. I would consider custom orthotics at that time.   Consent obtained and verified. Time-out conducted. Noted no overlying erythema, induration, or other signs of local infection. Skin prepped in a sterile fashion. Topical analgesic spray: Ethyl chloride. Joint: left knee Needle: 25g 1.5 inch Completed without difficulty. Meds: 3cc 1% xylocaine, 1cc (40mg ) depomedrol  Advised to call if fevers/chills, erythema, induration, drainage, or persistent bleeding.

## 2014-06-19 LAB — HM COLONOSCOPY

## 2014-07-05 ENCOUNTER — Ambulatory Visit: Payer: Self-pay | Admitting: Gastroenterology

## 2014-07-05 HISTORY — PX: COLONOSCOPY: SHX174

## 2014-07-05 HISTORY — PX: ESOPHAGOGASTRODUODENOSCOPY: SHX1529

## 2014-07-07 ENCOUNTER — Ambulatory Visit: Payer: No Typology Code available for payment source | Admitting: Internal Medicine

## 2014-07-12 ENCOUNTER — Ambulatory Visit: Payer: No Typology Code available for payment source | Admitting: Internal Medicine

## 2014-07-20 ENCOUNTER — Other Ambulatory Visit: Payer: Self-pay | Admitting: Sports Medicine

## 2014-07-20 ENCOUNTER — Ambulatory Visit
Admission: RE | Admit: 2014-07-20 | Discharge: 2014-07-20 | Disposition: A | Discharge status: Home / Self Care | Payer: No Typology Code available for payment source | Source: Ambulatory Visit | Attending: Sports Medicine | Admitting: Sports Medicine

## 2014-07-20 ENCOUNTER — Ambulatory Visit (INDEPENDENT_AMBULATORY_CARE_PROVIDER_SITE_OTHER): Payer: No Typology Code available for payment source | Admitting: Sports Medicine

## 2014-07-20 ENCOUNTER — Encounter: Payer: Self-pay | Admitting: Sports Medicine

## 2014-07-20 VITALS — BP 147/80 | HR 69 | Ht 62.0 in | Wt 180.0 lb

## 2014-07-20 DIAGNOSIS — M7660 Achilles tendinitis, unspecified leg: Secondary | ICD-10-CM | POA: Insufficient documentation

## 2014-07-20 DIAGNOSIS — M25562 Pain in left knee: Secondary | ICD-10-CM | POA: Insufficient documentation

## 2014-07-20 DIAGNOSIS — M7662 Achilles tendinitis, left leg: Secondary | ICD-10-CM

## 2014-07-20 DIAGNOSIS — M7661 Achilles tendinitis, right leg: Secondary | ICD-10-CM

## 2014-07-20 MED ORDER — NITROGLYCERIN 0.2 MG/HR TD PT24: MEDICATED_PATCH | TRANSDERMAL | Status: DC

## 2014-07-20 NOTE — Patient Instructions (Signed)

## 2014-07-20 NOTE — Progress Notes (Signed)
   HPI:  Heel pain: Persistently has had bilateral heel pain. She did the exercises given to her by Dr. Micheline Chapman, but states these made her pain worse. She was almost unable to walk due to the pain. She has found some relief with acupuncture, which is been doing 2 times weekly. She is planning to space this out a little due to cost. The pain is located on her bilateral Achilles tendons. Anytime she increases her level of exertion, by walking on uneven surfaces or doing extra activities, her ankles ache.  Left knee pain. At last visit had left knee injected with steroid. This improved her medial knee pain, but she has had persistent lateral pain. Her knee catches and sometimes gives out. Her last x-rays were done 2-3 years ago, and patient reports that she was told she had arthritis, but good space in her joints. She's taken Advil and tramadol as needed, and gets some relief from these. No fevers. Her knee is more swollen at the end of the day.  ROS: See HPI  Wheeler: History of osteoporosis, vitamin D deficiency, obesity, GERD  PHYSICAL EXAM: BP 147/80 mmHg  Pulse 69  Ht 5\' 2"  (1.575 m)  Wt 180 lb (81.647 kg)  BMI 32.91 kg/m2 Gen: No acute distress, pleasant, cooperative MSK:  Ankles: No swelling or erythema of bilateral ankles. Achilles tendons thickened bilaterally, right greater than left. Full range of motion of ankle. Full strength with dorsiflexion, plantarflexion, inversion and eversion bilaterally. No ankle instability. Left knee: One plus effusion present. No erythema or warmth. Lachman's negative. MCL and LCL intact to varus and valgus stressing. Full strength with knee flexion and extension. Tenderness to palpation over the lateral joint line with a positive McMurray's.  ASSESSMENT/PLAN:  Left knee pain Mild improvement after steroid injection. At this point we will proceed with obtaining standing x-rays. She has minimal arthritis on her x-ray, we will likely obtain an MRI. She may  have a meniscal tear given her history of lateral traumatic injury when a dog ran into her. Dr. Micheline Chapman will call her with x-ray results.   Achilles tendonitis Worsened by exercises. She has found relief with acupuncture, which we encouraged her to continue. Will start nitroglycerin protocol with 1/4 patch of nitroglycerin daily. Counseled on risks of adverse effects including headache.    SIGNED: Delorse Limber. Ardelia Mems, MD Family Medicine Resident PGY-3 [place]    Patient was seen and evaluated with the above resident. X-rays of the patient's left knee were reviewed. She has only minimal degenerative changes in the lateral compartment. Therefore we will go ahead and proceed with an MRI to rule out any significant meniscal tear which may need arthroscopy. I will plan on calling her with those results once available at which point we will delineate more definitive treatment in regards to her left knee.

## 2014-07-20 NOTE — Assessment & Plan Note (Signed)
Worsened by exercises. She has found relief with acupuncture, which we encouraged her to continue. Will start nitroglycerin protocol with 1/4 patch of nitroglycerin daily. Counseled on risks of adverse effects including headache.

## 2014-07-20 NOTE — Assessment & Plan Note (Signed)
Mild improvement after steroid injection. At this point we will proceed with obtaining standing x-rays. She has minimal arthritis on her x-ray, we will likely obtain an MRI. She may have a meniscal tear given her history of lateral traumatic injury when a dog ran into her. Dr. Micheline Chapman will call her with x-ray results.

## 2014-07-21 ENCOUNTER — Ambulatory Visit: Payer: Self-pay | Admitting: Otolaryngology

## 2014-07-21 ENCOUNTER — Other Ambulatory Visit: Payer: Self-pay | Admitting: *Deleted

## 2014-07-21 DIAGNOSIS — M25562 Pain in left knee: Secondary | ICD-10-CM

## 2014-07-25 ENCOUNTER — Ambulatory Visit
Admission: RE | Admit: 2014-07-25 | Discharge: 2014-07-25 | Disposition: A | Discharge status: Home / Self Care | Payer: No Typology Code available for payment source | Source: Ambulatory Visit | Attending: Sports Medicine | Admitting: Sports Medicine

## 2014-07-25 DIAGNOSIS — M25562 Pain in left knee: Secondary | ICD-10-CM

## 2014-07-26 ENCOUNTER — Telehealth: Payer: Self-pay | Admitting: Sports Medicine

## 2014-07-26 NOTE — Telephone Encounter (Signed)
I spoke with the patient on the phone today regarding MRI findings of her left knee. 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 Moderate size joint effusion is also seen. MRI findings correlate with her symptoms. I recommended surgical consultation to discussed the merits of arthroscopy. She has seen Dr. Alvan Dame in the past. She will call his office directly and schedule an appointment and she will let me know if she needs any help with that. Follow-up with me when necessary.

## 2014-08-18 ENCOUNTER — Encounter: Payer: Self-pay | Admitting: Internal Medicine

## 2014-08-18 ENCOUNTER — Ambulatory Visit (INDEPENDENT_AMBULATORY_CARE_PROVIDER_SITE_OTHER): Payer: No Typology Code available for payment source | Admitting: Internal Medicine

## 2014-08-18 VITALS — BP 117/74 | HR 75 | Temp 97.7°F | Ht 63.75 in | Wt 192.0 lb

## 2014-08-18 DIAGNOSIS — Z6830 Body mass index (BMI) 30.0-30.9, adult: Secondary | ICD-10-CM

## 2014-08-18 DIAGNOSIS — Z0000 Encounter for general adult medical examination without abnormal findings: Secondary | ICD-10-CM | POA: Diagnosis not present

## 2014-08-18 LAB — LIPID PANEL
CHOLESTEROL: 220 mg/dL — AB (ref 0–200)
HDL: 48.8 mg/dL (ref >39.00)
LDL Cholesterol: 143 mg/dL — ABNORMAL HIGH (ref 0–99)
NonHDL: 171.2
Total CHOL/HDL Ratio: 5
Triglycerides: 142 mg/dL (ref 0.0–149.0)
VLDL: 28.4 mg/dL (ref 0.0–40.0)

## 2014-08-18 LAB — CBC WITH DIFFERENTIAL/PLATELET
Basophils Absolute: 0 10*3/uL (ref 0.0–0.1)
Basophils Relative: 0.6 % (ref 0.0–3.0)
EOS PCT: 2.4 % (ref 0.0–5.0)
Eosinophils Absolute: 0.1 10*3/uL (ref 0.0–0.7)
HCT: 42.6 % (ref 36.0–46.0)
Hemoglobin: 14.5 g/dL (ref 12.0–15.0)
LYMPHS PCT: 25.4 % (ref 12.0–46.0)
Lymphs Abs: 1.5 10*3/uL (ref 0.7–4.0)
MCHC: 34 g/dL (ref 30.0–36.0)
MCV: 84.7 fl (ref 78.0–100.0)
MONOS PCT: 9 % (ref 3.0–12.0)
Monocytes Absolute: 0.5 10*3/uL (ref 0.1–1.0)
NEUTROS PCT: 62.6 % (ref 43.0–77.0)
Neutro Abs: 3.8 10*3/uL (ref 1.4–7.7)
PLATELETS: 230 10*3/uL (ref 150.0–400.0)
RBC: 5.03 Mil/uL (ref 3.87–5.11)
RDW: 13.3 % (ref 11.5–15.5)
WBC: 6.1 10*3/uL (ref 4.0–10.5)

## 2014-08-18 LAB — VITAMIN D 25 HYDROXY (VIT D DEFICIENCY, FRACTURES): VITD: 18.97 ng/mL — AB (ref 30.00–100.00)

## 2014-08-18 LAB — COMPREHENSIVE METABOLIC PANEL
ALBUMIN: 4.2 g/dL (ref 3.5–5.2)
ALT: 18 U/L (ref 0–35)
AST: 21 U/L (ref 0–37)
Alkaline Phosphatase: 67 U/L (ref 39–117)
BUN: 9 mg/dL (ref 6–23)
CALCIUM: 9.5 mg/dL (ref 8.4–10.5)
CO2: 27 mEq/L (ref 19–32)
Chloride: 103 mEq/L (ref 96–112)
Creatinine, Ser: 0.91 mg/dL (ref 0.40–1.20)
GFR: 66.98 mL/min (ref >60.00)
GLUCOSE: 92 mg/dL (ref 70–99)
POTASSIUM: 4.2 meq/L (ref 3.5–5.1)
SODIUM: 139 meq/L (ref 135–145)
TOTAL PROTEIN: 7 g/dL (ref 6.0–8.3)
Total Bilirubin: 0.8 mg/dL (ref 0.2–1.2)

## 2014-08-18 LAB — TSH: TSH: 3.12 u[IU]/mL (ref 0.35–4.50)

## 2014-08-18 LAB — HEMOGLOBIN A1C: HEMOGLOBIN A1C: 5.5 % (ref 4.6–6.5)

## 2014-08-18 NOTE — Progress Notes (Signed)
Subjective:    Patient ID: Stacy Murillo, female    DOB: 1955-11-17, 59 y.o.   MRN: [id number]  HPI  59YO female presents for annual exam.  Currently being seen for torn meniscus in left knee. Dr. Alvan Dame. Occurred when playing with dog in December. Had steroid shot with minimal improvement. Not taking anything for pain.  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 Path was benign.  Trying to improve diet, but does not like fruits and vegetables or meat in general. Often eats bagels or crackers.   Past medical, surgical, family and social history per today's encounter.  Review of Systems  Constitutional: Negative for fever, chills, appetite change, fatigue and unexpected weight change.  Eyes: Negative for visual disturbance.  Respiratory: Negative for shortness of breath.   Cardiovascular: Negative for chest pain and leg swelling.  Gastrointestinal: Negative for nausea, vomiting, abdominal pain, diarrhea and constipation.  Musculoskeletal: Negative for myalgias and arthralgias.  Skin: Negative for color change and rash.  Hematological: Negative for adenopathy. Does not bruise/bleed easily.  Psychiatric/Behavioral: Negative for sleep disturbance and dysphoric mood. The patient is not nervous/anxious.        Objective:    BP 117/74 mmHg  Pulse 75  Temp(Src) 97.7 F (36.5 C) (Oral)  Ht 5' 3.75" (1.619 m)  Wt 192 lb (87.091 kg)  BMI 33.23 kg/m2  SpO2 99% Physical Exam  Constitutional: She is oriented to person, place, and time. She appears well-developed and well-nourished. No distress.  HENT:  Head: Normocephalic and atraumatic.  Right Ear: External ear normal.  Left Ear: External ear normal.  Nose: Nose normal.  Mouth/Throat: Oropharynx is clear and moist. No oropharyngeal exudate.  Eyes: Conjunctivae are normal. Pupils are equal, round, and reactive to light. Right eye exhibits no discharge. Left eye exhibits no discharge. No scleral icterus.  Neck: Normal  range of motion. Neck supple. No tracheal deviation present. No thyromegaly present.  Cardiovascular: Normal rate, regular rhythm, normal heart sounds and intact distal pulses.  Exam reveals no gallop and no friction rub.   No murmur heard. Pulmonary/Chest: Effort normal and breath sounds normal. No accessory muscle usage. No tachypnea. No respiratory distress. She has no decreased breath sounds. She has no wheezes. She has no rales. She exhibits no tenderness. Right breast exhibits no inverted nipple, no mass, no nipple discharge, no skin change and no tenderness. Left breast exhibits no inverted nipple, no mass, no nipple discharge, no skin change and no tenderness. Breasts are symmetrical.  Abdominal: Soft. Bowel sounds are normal. She exhibits no distension and no mass. There is no tenderness. There is no rebound and no guarding.  Musculoskeletal: Normal range of motion. She exhibits no edema or tenderness.  Lymphadenopathy:    She has no cervical adenopathy.  Neurological: She is alert and oriented to person, place, and time. No cranial nerve deficit. She exhibits normal muscle tone. Coordination normal.  Skin: Skin is warm and dry. No rash noted. She is not diaphoretic. No erythema. No pallor.  Psychiatric: She has a normal mood and affect. Her behavior is normal. Judgment and thought content normal.          Assessment & Plan:   Problem List Items Addressed This Visit      Unprioritized   BMI 30.0-30.9,adult    Wt Readings from Last 3 Encounters:  08/18/14 192 lb (87.091 kg)  07/25/14 180 lb (81.647 kg)  07/20/14 180 lb (81.647 kg)   Body mass index is  33.23 kg/(m^2).  Encouraged healthy diet and exercise.       Routine general medical examination at a health care facility - Primary    General medical exam normal today including breast exam. PAP and pelvic deferred as normal 2013, plan repeat PAP in 2018. Mammogram ordered. Colonoscopy UTD. Flu vaccine declined. Encouraged  healthy diet and exercise. Labs today including CBC, CMP, lipids, A1c. TSH.      Relevant Orders   MM Digital Screening   CBC with Differential/Platelet   Comprehensive metabolic panel   Lipid panel   Vit D  25 hydroxy (rtn osteoporosis monitoring)   TSH   Hemoglobin A1c       Return in about 1 year (around 08/18/2015) for Physical.

## 2014-08-18 NOTE — Progress Notes (Signed)
Pre visit review using our clinic review tool, if applicable. No additional management support is needed unless otherwise documented below in the visit note. 

## 2014-08-18 NOTE — Assessment & Plan Note (Signed)
Wt Readings from Last 3 Encounters:  08/18/14 192 lb (87.091 kg)  07/25/14 180 lb (81.647 kg)  07/20/14 180 lb (81.647 kg)   Body mass index is 33.23 kg/(m^2).  Encouraged healthy diet and exercise.

## 2014-08-18 NOTE — Patient Instructions (Signed)

## 2014-08-18 NOTE — Assessment & Plan Note (Signed)
General medical exam normal today including breast exam. PAP and pelvic deferred as normal 2013, plan repeat PAP in 2018. Mammogram ordered. Colonoscopy UTD. Flu vaccine declined. Encouraged healthy diet and exercise. Labs today including CBC, CMP, lipids, A1c. TSH.

## 2014-08-30 ENCOUNTER — Telehealth: Payer: Self-pay | Admitting: Internal Medicine

## 2014-08-30 NOTE — Telephone Encounter (Signed)
Pt called to ask about status of 3D mammogram in [place]. Please advise pt/msn

## 2014-09-06 ENCOUNTER — Telehealth: Payer: Self-pay

## 2014-09-06 LAB — SURGICAL PATHOLOGY

## 2014-09-06 NOTE — Telephone Encounter (Signed)
The patient called and is hoping to have her mammogram scheduled.

## 2014-09-17 ENCOUNTER — Ambulatory Visit
Admission: RE | Admit: 2014-09-17 | Discharge: 2014-09-17 | Disposition: A | Discharge status: Home / Self Care | Payer: No Typology Code available for payment source | Source: Ambulatory Visit | Attending: Internal Medicine | Admitting: Internal Medicine

## 2014-09-17 DIAGNOSIS — Z0000 Encounter for general adult medical examination without abnormal findings: Secondary | ICD-10-CM

## 2015-07-06 DIAGNOSIS — K2970 Gastritis, unspecified, without bleeding: Secondary | ICD-10-CM

## 2015-07-06 DIAGNOSIS — K209 Esophagitis, unspecified without bleeding: Secondary | ICD-10-CM

## 2015-07-06 DIAGNOSIS — J392 Other diseases of pharynx: Secondary | ICD-10-CM

## 2015-07-06 DIAGNOSIS — K5790 Diverticulosis of intestine, part unspecified, without perforation or abscess without bleeding: Secondary | ICD-10-CM

## 2015-07-06 HISTORY — DX: Other diseases of pharynx: J39.2

## 2015-07-06 HISTORY — DX: Diverticulosis of intestine, part unspecified, without perforation or abscess without bleeding: K57.90

## 2015-07-06 HISTORY — DX: Gastritis, unspecified, without bleeding: K29.70

## 2015-07-06 HISTORY — DX: Esophagitis, unspecified without bleeding: K20.90

## 2015-08-14 ENCOUNTER — Emergency Department: Payer: BLUE CROSS/BLUE SHIELD

## 2015-08-14 ENCOUNTER — Encounter: Payer: Self-pay | Admitting: Emergency Medicine

## 2015-08-14 ENCOUNTER — Emergency Department
Admission: EM | Admit: 2015-08-14 | Discharge: 2015-08-14 | Disposition: A | Discharge status: Home / Self Care | Payer: BLUE CROSS/BLUE SHIELD | Attending: Emergency Medicine | Admitting: Emergency Medicine

## 2015-08-14 DIAGNOSIS — E669 Obesity, unspecified: Secondary | ICD-10-CM | POA: Diagnosis not present

## 2015-08-14 DIAGNOSIS — E785 Hyperlipidemia, unspecified: Secondary | ICD-10-CM | POA: Diagnosis not present

## 2015-08-14 DIAGNOSIS — F430 Acute stress reaction: Secondary | ICD-10-CM | POA: Diagnosis not present

## 2015-08-14 DIAGNOSIS — Z79899 Other long term (current) drug therapy: Secondary | ICD-10-CM | POA: Insufficient documentation

## 2015-08-14 DIAGNOSIS — F439 Reaction to severe stress, unspecified: Secondary | ICD-10-CM

## 2015-08-14 DIAGNOSIS — R002 Palpitations: Secondary | ICD-10-CM

## 2015-08-14 LAB — CBC
HCT: 39.7 % (ref 35.0–47.0)
HEMOGLOBIN: 13.5 g/dL (ref 12.0–16.0)
MCH: 28.1 pg (ref 26.0–34.0)
MCHC: 34 g/dL (ref 32.0–36.0)
MCV: 82.6 fL (ref 80.0–100.0)
Platelets: 57 10*3/uL — ABNORMAL LOW (ref 150–440)
RBC: 4.8 MIL/uL (ref 3.80–5.20)
RDW: 14.2 % (ref 11.5–14.5)
WBC: 6.2 10*3/uL (ref 3.6–11.0)

## 2015-08-14 LAB — BASIC METABOLIC PANEL
ANION GAP: 12 (ref 5–15)
BUN: 15 mg/dL (ref 6–20)
CHLORIDE: 99 mmol/L — AB (ref 101–111)
CO2: 23 mmol/L (ref 22–32)
Calcium: 9.2 mg/dL (ref 8.9–10.3)
Creatinine, Ser: 0.94 mg/dL (ref 0.44–1.00)
Glucose, Bld: 81 mg/dL (ref 65–99)
Potassium: 4.2 mmol/L (ref 3.5–5.1)
SODIUM: 134 mmol/L — AB (ref 135–145)

## 2015-08-14 LAB — TSH: TSH: 5.94 u[IU]/mL — ABNORMAL HIGH (ref 0.350–4.500)

## 2015-08-14 LAB — MAGNESIUM: MAGNESIUM: 1.9 mg/dL (ref 1.7–2.4)

## 2015-08-14 LAB — TROPONIN I: Troponin I: <0.03 ng/mL (ref <0.031)

## 2015-08-14 NOTE — ED Provider Notes (Signed)
[facility] Emergency Department Provider Note  ____________________________________________  Time seen: Approximately 8:15 PM  I have reviewed the triage vital signs and the nursing notes.   HISTORY  Chief Complaint Tachycardia    HPI Stacy Murillo is a 60 y.o. female with a history of palpitations presenting with palpitations.She reports that she has been doing a very large kitchen remodel and has been under significant stress due to this. Over the past 3-4 weeks she has multiple daily episodes where she has palpitations, which feels like heart racing. Jaquelyn Bitter time she counts to 8 over in over and eventually resolves on its own. She has never checked her heart rate except one time her husband checked it and it was 98. She has no associated chest pain or pressure, no chest tightness, lightheadedness or syncope. No history of thyroid dysfunction.   Past Medical History  Diagnosis Date  . Tachycardia   . Overweight(278.02)     Obesity  . Chest pain, unspecified   . Palpitations   . Osteoporosis     Patient Active Problem List   Diagnosis Date Noted  . Left knee pain 07/20/2014  . Achilles tendonitis 07/20/2014  . Esophageal reflux 04/06/2014  . Special screening for malignant neoplasms, colon 04/06/2014  . Routine general medical examination at a health care facility 07/17/2012  . Screening for breast cancer 07/10/2011  . Osteopenia 07/06/2011  . Vitamin D deficiency 07/06/2011  . Other and unspecified hyperlipidemia 07/06/2011  . Osteoporosis   . BMI 30.0-30.9,adult 10/19/2008    Past Surgical History  Procedure Laterality Date  . Cholecystectomy  1995  . Abdominal hysterectomy  2005    Current Outpatient Rx  Name  Route  Sig  Dispense  Refill  . Alum Hydroxide-Mag Carbonate (GAVISCON PO)   Oral   Take by mouth.         . Cetirizine HCl 10 MG CAPS   Oral   Take by mouth.         Marland Kitchen FLUZONE SUSP                 Dispense as  written.   . hyoscyamine (LEVSIN SL) 0.125 MG SL tablet               . Misc Natural Products (OSTEO BI-FLEX JOINT SHIELD PO)   Oral   Take by mouth daily. Hammer nutrition         . ranitidine (ZANTAC) 150 MG tablet   Oral   Take by mouth.         . traMADol (ULTRAM) 50 MG tablet   Oral   Take by mouth.         . Vitamins-Lipotropics (LIPO-FLAVONOID PLUS PO)   Oral   Take by mouth.           Allergies Review of patient's allergies indicates no known allergies.  Family History  Problem Relation Age of Onset  . Heart disease Neg Hx   . Diabetes Neg Hx     Social History Social History  Substance Use Topics  . Smoking status: Never Smoker   . Smokeless tobacco: Never Used  . Alcohol Use: No    Review of Systems Constitutional: No fever/chills. Lightheadedness or syncope. Eyes: No visual changes. ENT: No sore throat. No congestion or rhinorrhea. Cardiovascular: Denies chest pain. Nausea palpitations. Respiratory: Denies shortness of breath.  No cough. Gastrointestinal: No abdominal pain.  No nausea, no vomiting.  No diarrhea.  No constipation. Genitourinary:  Negative for dysuria. Musculoskeletal: Negative for back pain. Skin: Negative for rash. Neurological: Negative for headaches. No focal numbness, tingling or weakness.  Psych: Has a life stressors. Positive anhedonia. Denies SI, HI or hallucinations.  10-point ROS otherwise negative.  ____________________________________________   PHYSICAL EXAM:  VITAL SIGNS: ED Triage Vitals  Enc Vitals Group     BP 08/14/15 1644 169/76 mmHg     Pulse Rate 08/14/15 1644 103     Resp 08/14/15 1644 18     Temp 08/14/15 1644 98.7 F (37.1 C)     Temp Source 08/14/15 1644 Oral     SpO2 08/14/15 1644 98 %     Weight 08/14/15 1644 190 lb (86.183 kg)     Height 08/14/15 1644 5\' 2"  (1.575 m)     Head Cir --      Peak Flow --      Pain Score 08/14/15 1853 0     Pain Loc --      Pain Edu? --      Excl. in  [place]? --     Constitutional: Alert and oriented. Well appearing and in no acute distress. Answers questions appropriately. Eyes: Conjunctivae are normal.  EOMI. No scleral icterus. Head: Atraumatic. Nose: No congestion/rhinnorhea. Mouth/Throat: Mucous membranes are moist.  Neck: No stridor.  Supple.  JVD. Cardiovascular: Normal rate, regular rhythm. No murmurs, rubs or gallops.  Respiratory: Normal respiratory effort.  No accessory muscle use or retractions. Lungs CTAB.  No wheezes, rales or ronchi. Gastrointestinal: Soft, nontender and nondistended.  No guarding or rebound.  No peritoneal signs. Musculoskeletal: No LE edema. No ttp in the calves or palpable cords.  Negative Homan's sign. Neurologic:  A&Ox3.  Speech is clear.  Face and smile are symmetric.  EOMI.  Moves all extremities well. Skin:  Skin is warm, dry and intact. No rash noted. Psychiatric: Mood is normal with anxious affect. Speech and behavior are normal.  Normal judgement.  ____________________________________________   LABS (all labs ordered are listed, but only abnormal results are displayed)  Labs Reviewed  BASIC METABOLIC PANEL - Abnormal; Notable for the following:    Sodium 134 (*)    Chloride 99 (*)    All other components within normal limits  CBC - Abnormal; Notable for the following:    Platelets 57 (*)    All other components within normal limits  TSH - Abnormal; Notable for the following:    TSH 5.940 (*)    All other components within normal limits  TROPONIN I  MAGNESIUM   ____________________________________________  EKG  ED ECG REPORT I, Eula Listen, the attending physician, personally viewed and interpreted this ECG.   Date: 08/14/2015  EKG Time: 1649  Rate: 91  Rhythm: normal sinus rhythm  Axis: normal  Intervals:none  ST&T Change: no st changes  ____________________________________________  RADIOLOGY  Dg Chest 2 View  08/14/2015  CLINICAL DATA:  Irregular heartbeat for  2 weeks.  Weakness. EXAM: CHEST  2 VIEW COMPARISON:  None. FINDINGS: The cardiac silhouette, mediastinal and hilar contours are normal. The lungs are clear. No pleural effusion. The bony thorax is intact. IMPRESSION: No acute cardiopulmonary findings. Electronically Signed   By: Marijo Sanes M.D.   On: 08/14/2015 17:36    ____________________________________________   PROCEDURES  Procedure(s) performed: None  Critical Care performed: No ____________________________________________   INITIAL IMPRESSION / ASSESSMENT AND PLAN / ED COURSE  Pertinent labs & imaging results that were available during my care of the patient were reviewed  by me and considered in my medical decision making (see chart for details).  60 y.o. female with recent life stressors associated with multiple daily episodes of intermittent palpitations that do not have any other associated symptoms. Overall, the patient is well-appearing and her vital signs are stable. Her laboratory studies are reassuring and she does not have any electrolyte disturbance. Given that she is not having any unsafe associated symptoms, I think she is safe to go home and to have an outpatient follow-up with cardiology for Holter or event monitor. She has a cardiologist in [place] home she wishes to follow-up. She understands return precautions as well as follow-up instructions and will be discharged in stable condition. .  ____________________________________________  FINAL CLINICAL IMPRESSION(S) / ED DIAGNOSES  Final diagnoses:  Rapid palpitations  Stress at home      NEW MEDICATIONS STARTED DURING THIS VISIT:  Discharge Medication List as of 08/14/2015  7:49 PM       Eula Listen, MD 08/14/15 2112

## 2015-08-14 NOTE — ED Notes (Signed)
Lab called to add on magnesium and tsh

## 2015-08-14 NOTE — ED Notes (Signed)
Pt has had "racing heart rate" on and off for 2 weeks.  Pt reports heart rate as high as 100.  She also reports intermittent chest pain and SHOB.  Would have some fatigue as well.

## 2015-08-14 NOTE — ED Notes (Signed)
Pt reports pressure in chest and rapid/irregular heart beat for the last two weeks - Pt c/o being weak and tired - Pt denies nausea or vomiting - Pt reports loose stools but this is a normal occurrence for her since her gallbladder surgery - Pt reports shortness of breath with exertion

## 2015-08-14 NOTE — Discharge Instructions (Signed)
Please make an appointment with your cardiologist in [place], or with Dr. Saralyn Pilar.  They will re-evaluate you for your symptoms.  Please discuss getting a Holter monitor or an Event Monitor to further evaluate your palpitations.  Please return to the emergency department if you develop chest pain, shortness of breath, palpitations, lightheadedness or fainting, fever, or any other symptoms concerning to you.

## 2015-08-22 ENCOUNTER — Other Ambulatory Visit: Payer: Self-pay

## 2015-08-22 DIAGNOSIS — Z1231 Encounter for screening mammogram for malignant neoplasm of breast: Secondary | ICD-10-CM

## 2015-08-24 ENCOUNTER — Other Ambulatory Visit: Payer: Self-pay | Admitting: Internal Medicine

## 2015-08-24 ENCOUNTER — Other Ambulatory Visit: Payer: BLUE CROSS/BLUE SHIELD

## 2015-08-24 ENCOUNTER — Ambulatory Visit (INDEPENDENT_AMBULATORY_CARE_PROVIDER_SITE_OTHER): Payer: BLUE CROSS/BLUE SHIELD | Admitting: Internal Medicine

## 2015-08-24 ENCOUNTER — Telehealth: Payer: Self-pay | Admitting: Internal Medicine

## 2015-08-24 ENCOUNTER — Encounter: Payer: Self-pay | Admitting: Internal Medicine

## 2015-08-24 VITALS — BP 132/77 | HR 108 | Temp 98.2°F | Ht 63.75 in | Wt 175.0 lb

## 2015-08-24 DIAGNOSIS — D611 Drug-induced aplastic anemia: Secondary | ICD-10-CM

## 2015-08-24 DIAGNOSIS — R002 Palpitations: Secondary | ICD-10-CM

## 2015-08-24 DIAGNOSIS — Z0000 Encounter for general adult medical examination without abnormal findings: Secondary | ICD-10-CM

## 2015-08-24 DIAGNOSIS — D72821 Monocytosis (symptomatic): Secondary | ICD-10-CM

## 2015-08-24 DIAGNOSIS — D696 Thrombocytopenia, unspecified: Secondary | ICD-10-CM | POA: Diagnosis not present

## 2015-08-24 DIAGNOSIS — Z0001 Encounter for general adult medical examination with abnormal findings: Secondary | ICD-10-CM | POA: Diagnosis not present

## 2015-08-24 LAB — COMPREHENSIVE METABOLIC PANEL
ALK PHOS: 111 U/L (ref 39–117)
ALT: 52 U/L — AB (ref 0–35)
AST: 42 U/L — AB (ref 0–37)
Albumin: 4.3 g/dL (ref 3.5–5.2)
BILIRUBIN TOTAL: 0.6 mg/dL (ref 0.2–1.2)
BUN: 17 mg/dL (ref 6–23)
CO2: 26 meq/L (ref 19–32)
Calcium: 10.2 mg/dL (ref 8.4–10.5)
Chloride: 99 mEq/L (ref 96–112)
Creatinine, Ser: 0.9 mg/dL (ref 0.40–1.20)
GFR: 67.61 mL/min (ref >60.00)
GLUCOSE: 85 mg/dL (ref 70–99)
Potassium: 4.2 mEq/L (ref 3.5–5.1)
SODIUM: 140 meq/L (ref 135–145)
TOTAL PROTEIN: 6.6 g/dL (ref 6.0–8.3)

## 2015-08-24 LAB — CBC WITH DIFFERENTIAL/PLATELET
Basophils Absolute: 106 cells/uL (ref 0–200)
Basophils Relative: 2 %
EOS ABS: 0 {cells}/uL — AB (ref 15–500)
Eosinophils Relative: 0 %
HEMATOCRIT: 38 % (ref 35.0–45.0)
HEMOGLOBIN: 12.8 g/dL (ref 11.7–15.5)
LYMPHS ABS: 1908 {cells}/uL (ref 850–3900)
LYMPHS PCT: 36 %
MCH: 28.3 pg (ref 27.0–33.0)
MCHC: 33.7 g/dL (ref 32.0–36.0)
MCV: 84.1 fL (ref 80.0–100.0)
MONO ABS: 1696 {cells}/uL — AB (ref 200–950)
MPV: 9.6 fL (ref 7.5–12.5)
Monocytes Relative: 32 %
Neutro Abs: 1590 cells/uL (ref 1500–7800)
Neutrophils Relative %: 30 %
Platelets: 76 10*3/uL — ABNORMAL LOW (ref 140–400)
RBC: 4.52 MIL/uL (ref 3.80–5.10)
RDW: 14.9 % (ref 11.0–15.0)
WBC: 5.3 10*3/uL (ref 3.8–10.8)

## 2015-08-24 LAB — T4, FREE: Free T4: 0.93 ng/dL (ref 0.60–1.60)

## 2015-08-24 LAB — TSH: TSH: 3.33 u[IU]/mL (ref 0.35–4.50)

## 2015-08-24 NOTE — Assessment & Plan Note (Signed)
General medical exam normal today including breast exam. PAP and pelvic deferred as normal 2013, plan repeat PAP in 2018. Mammogram scheduled. Colonoscopy UTD. Flu vaccine declined. Encouraged healthy diet and exercise. Labs today including CBC, CMP, lipids, A1c. TSH.

## 2015-08-24 NOTE — Progress Notes (Signed)
Pre visit review using our clinic review tool, if applicable. No additional management support is needed unless otherwise documented below in the visit note. 

## 2015-08-24 NOTE — Progress Notes (Signed)
Subjective:    Patient ID: Stacy Murillo, female    DOB: Mar 08, 1956, 60 y.o.   MRN: [id number]  HPI  60YO female presents for physical exam.  Recently seen in ED for palpitations. Evaluation was normal including labs, CXR. EKG showed sinus tach. Continues to have forceful heartbeats. Perhaps improved somewhat from previous.  2 months ago started strict diet. Appetite has been decreased with some nausea at times. Some stressors at home with remodel. Also notes some increased bruising recently over arms. Plt count in ER visit was low 57. No bleeding from gums, nose, or in stool. No recent illnesses. No fever.   Wt Readings from Last 3 Encounters:  08/24/15 175 lb (79.379 kg)  08/14/15 190 lb (86.183 kg)  08/18/14 192 lb (87.091 kg)   BP Readings from Last 3 Encounters:  08/24/15 132/77  08/14/15 132/72  08/18/14 117/74    Past Medical History  Diagnosis Date  . Tachycardia   . Overweight(278.02)     Obesity  . Chest pain, unspecified   . Palpitations   . Osteoporosis    Family History  Problem Relation Age of Onset  . Heart disease Neg Hx   . Diabetes Neg Hx    Past Surgical History  Procedure Laterality Date  . Cholecystectomy  1995  . Abdominal hysterectomy  2005   Social History   Social History  . Marital Status: Married    Spouse Name: N/A  . Number of Children: N/A  . Years of Education: N/A   Social History Main Topics  . Smoking status: Never Smoker   . Smokeless tobacco: Never Used  . Alcohol Use: No  . Drug Use: No  . Sexual Activity: Not on file   Other Topics Concern  . Not on file   Social History Narrative   Married   Does not get regular exercise    Review of Systems  Constitutional: Positive for appetite change. Negative for fever, chills, fatigue and unexpected weight change.  HENT: Negative for congestion.   Eyes: Negative for visual disturbance.  Respiratory: Negative for cough, chest tightness, shortness of breath and  wheezing.   Cardiovascular: Positive for palpitations. Negative for chest pain and leg swelling.  Gastrointestinal: Negative for nausea, vomiting, abdominal pain, diarrhea and constipation.  Musculoskeletal: Negative for myalgias and arthralgias.  Skin: Negative for color change and rash.  Neurological: Negative for weakness, light-headedness and headaches.  Hematological: Negative for adenopathy. Bruises/bleeds easily.  Psychiatric/Behavioral: Negative for sleep disturbance and dysphoric mood. The patient is not nervous/anxious.        Objective:    BP 132/77 mmHg  Pulse 108  Temp(Src) 98.2 F (36.8 C) (Oral)  Ht 5' 3.75" (1.619 m)  Wt 175 lb (79.379 kg)  BMI 30.28 kg/m2  SpO2 97% Physical Exam  Constitutional: She is oriented to person, place, and time. She appears well-developed and well-nourished. No distress.  HENT:  Head: Normocephalic and atraumatic.  Right Ear: External ear normal.  Left Ear: External ear normal.  Nose: Nose normal.  Mouth/Throat: Oropharynx is clear and moist. No oropharyngeal exudate.  Eyes: Conjunctivae are normal. Pupils are equal, round, and reactive to light. Right eye exhibits no discharge. Left eye exhibits no discharge. No scleral icterus.  Neck: Normal range of motion. Neck supple. No tracheal deviation present. No thyromegaly present.  Cardiovascular: Normal rate, regular rhythm, normal heart sounds and intact distal pulses.  Exam reveals no gallop and no friction rub.   No murmur heard. Pulmonary/Chest:  Effort normal and breath sounds normal. No accessory muscle usage. No tachypnea. No respiratory distress. She has no decreased breath sounds. She has no wheezes. She has no rales. She exhibits no tenderness. Right breast exhibits no inverted nipple, no mass, no nipple discharge, no skin change and no tenderness. Left breast exhibits no inverted nipple, no mass, no nipple discharge, no skin change and no tenderness. Breasts are symmetrical.    Abdominal: Soft. Bowel sounds are normal. She exhibits no distension and no mass. There is no tenderness. There is no rebound and no guarding.  Musculoskeletal: Normal range of motion. She exhibits no edema or tenderness.  Lymphadenopathy:    She has no cervical adenopathy.  Neurological: She is alert and oriented to person, place, and time. No cranial nerve deficit. She exhibits normal muscle tone. Coordination normal.  Skin: Skin is warm and dry. Ecchymosis (few scattered over chest and abdomen) noted. No rash noted. She is not diaphoretic. No erythema. No pallor.  Psychiatric: She has a normal mood and affect. Her behavior is normal. Judgment and thought content normal.          Assessment & Plan:   Problem List Items Addressed This Visit      Unprioritized   Palpitations    Recent palpitations. Seen in ED. Labs remarkable for low platelets. EKG showed sinus tach. We discussed potential causes. Will repeat TSH, CBC, CMP today. Cardiology referral for possible Holter. Follow up in 4 weeks.      Relevant Orders   Ambulatory referral to Cardiology   Routine general medical examination at a health care facility - Primary    General medical exam normal today including breast exam. PAP and pelvic deferred as normal 2013, plan repeat PAP in 2018. Mammogram scheduled. Colonoscopy UTD. Flu vaccine declined. Encouraged healthy diet and exercise. Labs today including CBC, CMP, lipids, A1c. TSH.        Relevant Orders   Comprehensive metabolic panel   TSH   T4, free   Thrombocytopenia (HCC)    Lab Results  Component Value Date   PLT 57* 08/14/2015   Plt low on recent labs. Will repeat today. Discussed potential causes of low plt.       Relevant Orders   CBC with Differential/Platelet       Return in about 4 weeks (around 09/21/2015) for Recheck.  Ronette Deter, MD Internal Medicine [facility]

## 2015-08-24 NOTE — Telephone Encounter (Signed)
Follow up.

## 2015-08-24 NOTE — Patient Instructions (Addendum)
Labs today.  We will set up evaluation with cardiology.  Follow up here in 4 weeks.  Health Maintenance, Female Adopting a healthy lifestyle and getting preventive care can go a long way to promote health and wellness. Talk with your health care provider about what schedule of regular examinations is right for you. This is a good chance for you to check in with your provider about disease prevention and staying healthy. In between checkups, there are plenty of things you can do on your own. Experts have done a lot of research about which lifestyle changes and preventive measures are most likely to keep you healthy. Ask your health care provider for more information. WEIGHT AND DIET  Eat a healthy diet  Be sure to include plenty of vegetables, fruits, low-fat dairy products, and lean protein.  Do not eat a lot of foods high in solid fats, added sugars, or salt.  Get regular exercise. This is one of the most important things you can do for your health.  Most adults should exercise for at least 150 minutes each week. The exercise should increase your heart rate and make you sweat (moderate-intensity exercise).  Most adults should also do strengthening exercises at least twice a week. This is in addition to the moderate-intensity exercise.  Maintain a healthy weight  Body mass index (BMI) is a measurement that can be used to identify possible weight problems. It estimates body fat based on height and weight. Your health care provider can help determine your BMI and help you achieve or maintain a healthy weight.  For females 41 years of age and older:   A BMI below 18.5 is considered underweight.  A BMI of 18.5 to 24.9 is normal.  A BMI of 25 to 29.9 is considered overweight.  A BMI of 30 and above is considered obese.  Watch levels of cholesterol and blood lipids  You should start having your blood tested for lipids and cholesterol at 61 years of age, then have this test every 5  years.  You may need to have your cholesterol levels checked more often if:  Your lipid or cholesterol levels are high.  You are older than 61 years of age.  You are at high risk for heart disease.  CANCER SCREENING   Lung Cancer  Lung cancer screening is recommended for adults 32-20 years old who are at high risk for lung cancer because of a history of smoking.  A yearly low-dose CT scan of the lungs is recommended for people who:  Currently smoke.  Have quit within the past 15 years.  Have at least a 30-pack-year history of smoking. A pack year is smoking an average of one pack of cigarettes a day for 1 year.  Yearly screening should continue until it has been 15 years since you quit.  Yearly screening should stop if you develop a health problem that would prevent you from having lung cancer treatment.  Breast Cancer  Practice breast self-awareness. This means understanding how your breasts normally appear and feel.  It also means doing regular breast self-exams. Let your health care provider know about any changes, no matter how small.  If you are in your 20s or 30s, you should have a clinical breast exam (CBE) by a health care provider every 1-3 years as part of a regular health exam.  If you are 64 or older, have a CBE every year. Also consider having a breast X-ray (mammogram) every year.  If you have  a family history of breast cancer, talk to your health care provider about genetic screening.  If you are at high risk for breast cancer, talk to your health care provider about having an MRI and a mammogram every year.  Breast cancer gene (BRCA) assessment is recommended for women who have family members with BRCA-related cancers. BRCA-related cancers include:  Breast.  Ovarian.  Tubal.  Peritoneal cancers.  Results of the assessment will determine the need for genetic counseling and BRCA1 and BRCA2 testing. Cervical Cancer Your health care provider may  recommend that you be screened regularly for cancer of the pelvic organs (ovaries, uterus, and vagina). This screening involves a pelvic examination, including checking for microscopic changes to the surface of your cervix (Pap test). You may be encouraged to have this screening done every 3 years, beginning at age 21.  For women ages 30-65, health care providers may recommend pelvic exams and Pap testing every 3 years, or they may recommend the Pap and pelvic exam, combined with testing for human papilloma virus (HPV), every 5 years. Some types of HPV increase your risk of cervical cancer. Testing for HPV may also be done on women of any age with unclear Pap test results.  Other health care providers may not recommend any screening for nonpregnant women who are considered low risk for pelvic cancer and who do not have symptoms. Ask your health care provider if a screening pelvic exam is right for you.  If you have had past treatment for cervical cancer or a condition that could lead to cancer, you need Pap tests and screening for cancer for at least 20 years after your treatment. If Pap tests have been discontinued, your risk factors (such as having a new sexual partner) need to be reassessed to determine if screening should resume. Some women have medical problems that increase the chance of getting cervical cancer. In these cases, your health care provider may recommend more frequent screening and Pap tests. Colorectal Cancer  This type of cancer can be detected and often prevented.  Routine colorectal cancer screening usually begins at 61 years of age and continues through 61 years of age.  Your health care provider may recommend screening at an earlier age if you have risk factors for colon cancer.  Your health care provider may also recommend using home test kits to check for hidden blood in the stool.  A small camera at the end of a tube can be used to examine your colon directly  (sigmoidoscopy or colonoscopy). This is done to check for the earliest forms of colorectal cancer.  Routine screening usually begins at age 50.  Direct examination of the colon should be repeated every 5-10 years through 61 years of age. However, you may need to be screened more often if early forms of precancerous polyps or small growths are found. Skin Cancer  Check your skin from head to toe regularly.  Tell your health care provider about any new moles or changes in moles, especially if there is a change in a mole's shape or color.  Also tell your health care provider if you have a mole that is larger than the size of a pencil eraser.  Always use sunscreen. Apply sunscreen liberally and repeatedly throughout the day.  Protect yourself by wearing long sleeves, pants, a wide-brimmed hat, and sunglasses whenever you are outside. HEART DISEASE, DIABETES, AND HIGH BLOOD PRESSURE   High blood pressure causes heart disease and increases the risk of stroke. High   blood pressure is more likely to develop in:  People who have blood pressure in the high end of the normal range (130-139/85-89 mm Hg).  People who are overweight or obese.  People who are African American.  If you are 18-39 years of age, have your blood pressure checked every 3-5 years. If you are 40 years of age or older, have your blood pressure checked every year. You should have your blood pressure measured twice--once when you are at a hospital or clinic, and once when you are not at a hospital or clinic. Record the average of the two measurements. To check your blood pressure when you are not at a hospital or clinic, you can use:  An automated blood pressure machine at a pharmacy.  A home blood pressure monitor.  If you are between 55 years and 79 years old, ask your health care provider if you should take aspirin to prevent strokes.  Have regular diabetes screenings. This involves taking a blood sample to check your  fasting blood sugar level.  If you are at a normal weight and have a low risk for diabetes, have this test once every three years after 61 years of age.  If you are overweight and have a high risk for diabetes, consider being tested at a younger age or more often. PREVENTING INFECTION  Hepatitis B  If you have a higher risk for hepatitis B, you should be screened for this virus. You are considered at high risk for hepatitis B if:  You were born in a country where hepatitis B is common. Ask your health care provider which countries are considered high risk.  Your parents were born in a high-risk country, and you have not been immunized against hepatitis B (hepatitis B vaccine).  You have HIV or AIDS.  You use needles to inject street drugs.  You live with someone who has hepatitis B.  You have had sex with someone who has hepatitis B.  You get hemodialysis treatment.  You take certain medicines for conditions, including cancer, organ transplantation, and autoimmune conditions. Hepatitis C  Blood testing is recommended for:  Everyone born from 1945 through 1965.  Anyone with known risk factors for hepatitis C. Sexually transmitted infections (STIs)  You should be screened for sexually transmitted infections (STIs) including gonorrhea and chlamydia if:  You are sexually active and are younger than 61 years of age.  You are older than 61 years of age and your health care provider tells you that you are at risk for this type of infection.  Your sexual activity has changed since you were last screened and you are at an increased risk for chlamydia or gonorrhea. Ask your health care provider if you are at risk.  If you do not have HIV, but are at risk, it may be recommended that you take a prescription medicine daily to prevent HIV infection. This is called pre-exposure prophylaxis (PrEP). You are considered at risk if:  You are sexually active and do not regularly use condoms or  know the HIV status of your partner(s).  You take drugs by injection.  You are sexually active with a partner who has HIV. Talk with your health care provider about whether you are at high risk of being infected with HIV. If you choose to begin PrEP, you should first be tested for HIV. You should then be tested every 3 months for as long as you are taking PrEP.  PREGNANCY   If you are   If you are premenopausal and you may become pregnant, ask your health care provider about preconception counseling.  If you may become pregnant, take 400 to 800 micrograms (mcg) of folic acid every day.  If you want to prevent pregnancy, talk to your health care provider about birth control (contraception). OSTEOPOROSIS AND MENOPAUSE   Osteoporosis is a disease in which the bones lose minerals and strength with aging. This can result in serious bone fractures. Your risk for osteoporosis can be identified using a bone density scan.  If you are 65 years of age or older, or if you are at risk for osteoporosis and fractures, ask your health care provider if you should be screened.  Ask your health care provider whether you should take a calcium or vitamin D supplement to lower your risk for osteoporosis.  Menopause may have certain physical symptoms and risks.  Hormone replacement therapy may reduce some of these symptoms and risks. Talk to your health care provider about whether hormone replacement therapy is right for you.  HOME CARE INSTRUCTIONS   Schedule regular health, dental, and eye exams.  Stay current with your immunizations.   Do not use any tobacco products including cigarettes, chewing tobacco, or electronic cigarettes.  If you are pregnant, do not drink alcohol.  If you are breastfeeding, limit how much and how often you drink alcohol.  Limit alcohol intake to no more than 1 drink per day for nonpregnant women. One drink equals 12 ounces of beer, 5 ounces of wine, or 1 ounces of hard  liquor.  Do not use street drugs.  Do not share needles.  Ask your health care provider for help if you need support or information about quitting drugs.  Tell your health care provider if you often feel depressed.  Tell your health care provider if you have ever been abused or do not feel safe at home.   This information is not intended to replace advice given to you by your health care provider. Make sure you discuss any questions you have with your health care provider.   Document Released: 11/13/2010 Document Revised: 05/21/2014 Document Reviewed: 04/01/2013 Elsevier Interactive Patient Education 2016 [employer] Inc.  

## 2015-08-24 NOTE — Assessment & Plan Note (Signed)
Lab Results  Component Value Date   PLT 57* 08/14/2015   Plt low on recent labs. Will repeat today. Discussed potential causes of low plt.

## 2015-08-24 NOTE — Assessment & Plan Note (Signed)
Recent palpitations. Seen in ED. Labs remarkable for low platelets. EKG showed sinus tach. We discussed potential causes. Will repeat TSH, CBC, CMP today. Cardiology referral for possible Holter. Follow up in 4 weeks.

## 2015-08-25 ENCOUNTER — Telehealth: Payer: Self-pay | Admitting: Internal Medicine

## 2015-08-25 NOTE — Telephone Encounter (Signed)
Pt called to follow up on lab results. Call pt @ [phone] Thank you!

## 2015-08-25 NOTE — Telephone Encounter (Signed)
Left vm for pt to return my call, Notified pt that she can also check her MyChart as Dr Gilford Rile sent results to her

## 2015-09-02 ENCOUNTER — Telehealth: Payer: Self-pay | Admitting: *Deleted

## 2015-09-02 ENCOUNTER — Telehealth: Payer: Self-pay | Admitting: Internal Medicine

## 2015-09-02 ENCOUNTER — Inpatient Hospital Stay: Payer: BLUE CROSS/BLUE SHIELD | Attending: Internal Medicine | Admitting: Internal Medicine

## 2015-09-02 ENCOUNTER — Other Ambulatory Visit: Payer: Self-pay | Admitting: *Deleted

## 2015-09-02 ENCOUNTER — Inpatient Hospital Stay: Payer: BLUE CROSS/BLUE SHIELD

## 2015-09-02 VITALS — BP 141/87 | HR 99 | Temp 98.4°F | Resp 18 | Wt 171.7 lb

## 2015-09-02 DIAGNOSIS — D696 Thrombocytopenia, unspecified: Secondary | ICD-10-CM

## 2015-09-02 DIAGNOSIS — E669 Obesity, unspecified: Secondary | ICD-10-CM | POA: Diagnosis not present

## 2015-09-02 DIAGNOSIS — Z79899 Other long term (current) drug therapy: Secondary | ICD-10-CM | POA: Diagnosis not present

## 2015-09-02 DIAGNOSIS — M810 Age-related osteoporosis without current pathological fracture: Secondary | ICD-10-CM | POA: Insufficient documentation

## 2015-09-02 LAB — CBC WITH DIFFERENTIAL/PLATELET
BASOS PCT: 2 %
Band Neutrophils: 1 %
Basophils Absolute: 0.1 10*3/uL (ref 0–0.1)
Blasts: 10 %
Eosinophils Absolute: 0.1 10*3/uL (ref 0–0.7)
Eosinophils Relative: 1 %
HEMATOCRIT: 38.1 % (ref 35.0–47.0)
Hemoglobin: 13 g/dL (ref 12.0–16.0)
LYMPHS ABS: 1 10*3/uL (ref 1.0–3.6)
LYMPHS PCT: 19 %
MCH: 28.1 pg (ref 26.0–34.0)
MCHC: 34 g/dL (ref 32.0–36.0)
MCV: 82.7 fL (ref 80.0–100.0)
Metamyelocytes Relative: 2 %
Monocytes Absolute: 1 10*3/uL — ABNORMAL HIGH (ref 0.2–0.9)
Monocytes Relative: 19 %
Myelocytes: 3 %
NEUTROS PCT: 40 %
NRBC: 0 /100{WBCs}
Neutro Abs: 2.5 10*3/uL (ref 1.4–6.5)
Other: 0 %
PROMYELOCYTES ABS: 3 %
Platelets: 74 10*3/uL — ABNORMAL LOW (ref 150–440)
RBC: 4.61 MIL/uL (ref 3.80–5.20)
RDW: 15.4 % — ABNORMAL HIGH (ref 11.5–14.5)
WBC: 5 10*3/uL (ref 3.6–11.0)

## 2015-09-02 LAB — PATHOLOGIST SMEAR REVIEW

## 2015-09-02 LAB — LACTATE DEHYDROGENASE: LDH: 802 U/L — ABNORMAL HIGH (ref 98–192)

## 2015-09-02 NOTE — Telephone Encounter (Signed)
I contacted patient. Pt provided with apts x 2 weeks for cbc. She will be out of town the week of May 10'th for several days for her daughter's graduation. At this time, she will wait until the lab check on May 4'th to determine if she should come in on May 9'th. I spoke with Dr. Rogue Bussing, who is agreeable to this plan. Teach back process performed. Cbcs ordered in chl x 4

## 2015-09-02 NOTE — Telephone Encounter (Signed)
We will try treating the patient home phone/cell phone. Unable to reach.   Discussed with Dr. Sherri Sear for abnormal-looking cells in the blood. Recommend further workup. We will contact patient the morning on Monday for further workup- including bone marrow biopsy. She will need to be seen Monday at 10:15.  Labs have been ordered/incluidng bone marrow bx.

## 2015-09-02 NOTE — Patient Instructions (Signed)
Thrombocytopenia Thrombocytopenia means there are not enough platelets in your blood. Platelets are tiny cells in your blood. When you start bleeding, platelets clump together around the cut or injury to stop the bleeding. This process is called blood clotting. Not having enough platelets can cause bleeding problems. HOME CARE  Check your skin and inside your mouth for bruises or blood as told by your doctor.  Check your spit (sputum), pee (urine), and poop (stool) for blood as told by your doctor.  Do not do activities that can cause bumps or bruises until your doctor says it is okay.  Be careful not to cut yourself when you shave or use scissors, needles, knives, or other tools.  Be careful not to burn yourself when you iron or cook.  Ask your doctor if you can drink alcohol.  Only take medicines as told by your doctor.  Tell all your doctors and your dentist that you have this bleeding problem. GET HELP RIGHT AWAY IF:  You are bleeding anywhere on your body.  You are bleeding or have bruises without knowing why.  You have blood in your spit, pee, or poop. MAKE SURE YOU:  Understand these instructions.  Will watch your condition.  Will get help right away if you are not doing well or get worse.   This information is not intended to replace advice given to you by your health care provider. Make sure you discuss any questions you have with your health care provider.   Document Released: 04/19/2011 Document Revised: 07/23/2011 Document Reviewed: 11/01/2014 Elsevier Interactive Patient Education 2016 Elsevier Inc.  

## 2015-09-02 NOTE — Telephone Encounter (Signed)
-----   Message from Cammie Sickle, MD sent at 09/02/2015  3:47 PM EDT ----- Platelets are 74/fairly stable from 76 from 10 days ago. For now monitor with CBC on a weekly basis 4  Please inform patient. Order CBC thanks

## 2015-09-02 NOTE — Telephone Encounter (Signed)
Kim in Main lab/pathology x [phone] called to get patient h/o.  Slides were sent to pathology for review. Cbc still pending at this time. She asked if patient had a cancer dx. I explained that Dr. Rogue Bussing saw her for the first time for thrombocytopenia, unknown etiology.  She thanked me for clarification and said she would get the pathologist to review the slides for final results.

## 2015-09-02 NOTE — Progress Notes (Signed)
[facility] NOTE  Patient Care Team: Jackolyn Confer, MD as PCP - General (Internal Medicine)  CHIEF COMPLAINTS/PURPOSE OF CONSULTATION:   # MARCH 2017- ISOLATED THROMBOCYTOPENIA [54 to 72]  HISTORY OF PRESENTING ILLNESS:  Stacy Murillo 60 y.o.  female with no significant past medical history- had episode of palpitation approximately 3-4 weeks ago for which she was evaluated in the emergency room. However as per the patient workup was negative. Incidentally her platelets were down to 54; white count hemoglobin normal. CMP within normal limits.  Patient had a physical with her primary care physician; minimal repeat CBC showed platelet of 72. She has been referred to [place] for further evaluation.  Patient stated that she had been fatigued for the last few weeks. Had complained of mild myalgias. No fevers or chills. Also complains of leg cramps bilaterally in the lower extremity is. She has been hydrating herself. She is awaiting a cardiology evaluation at this time for palpitations.  Patient denies any new medications. Denies alcohol abuse. Admits to mild bruising; however no epistaxis or gum bleeding. No rectal bleeding.    ROS: A complete 10 point review of system is done which is negative except mentioned above in history of present illness  MEDICAL HISTORY:  Past Medical History  Diagnosis Date  . Tachycardia   . Overweight(278.02)     Obesity  . Chest pain, unspecified   . Palpitations   . Osteoporosis     SURGICAL HISTORY: Past Surgical History  Procedure Laterality Date  . Cholecystectomy  1995  . Abdominal hysterectomy  2005    SOCIAL HISTORY: Social History   Social History  . Marital Status: Married    Spouse Name: N/A  . Number of Children: N/A  . Years of Education: N/A   Occupational History  . Not on file.   Social History Main Topics  . Smoking status: Never Smoker   . Smokeless tobacco: Never Used  . Alcohol Use: No  . Drug  Use: No  . Sexual Activity: Not on file   Other Topics Concern  . Not on file   Social History Narrative   Married   Does not get regular exercise    FAMILY HISTORY: Family History  Problem Relation Age of Onset  . Heart disease Neg Hx   . Diabetes Neg Hx     ALLERGIES:  has No Known Allergies.  MEDICATIONS:  Current Outpatient Prescriptions  Medication Sig Dispense Refill  . Alum Hydroxide-Mag Carbonate (GAVISCON PO) Take by mouth.    . Calcium Carbonate-Vit D-Min (CALCIUM 1200 PO) Take by mouth 2 (two) times daily.    . Cholecalciferol (CVS VIT D 5000 HIGH-POTENCY PO) Take by mouth.    . Probiotic Product (ALIGN PO) Take by mouth.    . ranitidine (ZANTAC) 150 MG tablet Take by mouth.     No current facility-administered medications for this visit.      Marland Kitchen  PHYSICAL EXAMINATION:   Filed Vitals:   09/02/15 1113  BP: 141/87  Pulse: 99  Resp: 18   Filed Weights   09/02/15 1113  Weight: 171 lb 11.8 oz (77.9 kg)    GENERAL: Well-nourished well-developed; Alert, no distress and comfortable.  Alone. EYES: no pallor or icterus OROPHARYNX: no thrush or ulceration; good dentition  NECK: supple, no masses felt LYMPH:  no palpable lymphadenopathy in the cervical, axillary or inguinal regions LUNGS: clear to auscultation and  No wheeze or crackles HEART/CVS: regular rate & rhythm  and no murmurs; No lower extremity edema ABDOMEN: abdomen soft, non-tender and normal bowel sounds Musculoskeletal:no cyanosis of digits and no clubbing  PSYCH: alert & oriented x 3 with fluent speech NEURO: no focal motor/sensory deficits SKIN:  no rashes or significant lesions  LABORATORY DATA:  I have reviewed the data as listed Lab Results  Component Value Date   WBC 5.3 08/24/2015   HGB 12.8 08/24/2015   HCT 38.0 08/24/2015   MCV 84.1 08/24/2015   PLT 76* 08/24/2015    Recent Labs  08/14/15 1709 08/24/15 1141  NA 134* 140  K 4.2 4.2  CL 99* 99  CO2 23 26  GLUCOSE 81 85   BUN 15 17  CREATININE 0.94 0.90  CALCIUM 9.2 10.2  GFRNONAA >60  --   GFRAA >60  --   PROT  --  6.6  ALBUMIN  --  4.3  AST  --  42*  ALT  --  52*  ALKPHOS  --  111  BILITOT  --  0.6    RADIOGRAPHIC STUDIES: I have personally reviewed the radiological images as listed and agreed with the findings in the report. Dg Chest 2 View  08/14/2015  CLINICAL DATA:  Irregular heartbeat for 2 weeks.  Weakness. EXAM: CHEST  2 VIEW COMPARISON:  None. FINDINGS: The cardiac silhouette, mediastinal and hilar contours are normal. The lungs are clear. No pleural effusion. The bony thorax is intact. IMPRESSION: No acute cardiopulmonary findings. Electronically Signed   By: Marijo Sanes M.D.   On: 08/14/2015 17:36    ASSESSMENT & PLAN:   # ISOLATED THROMBOCYTOPENIA- New onset incidentally noted on labs in April 2017. The platelets improved from 52 to 76,000. Normal white count and normal hemoglobin. Likely viral process/ or ITP. Less likely to be related to a primary bone marrow process/no hepatosplenomegaly On clinical exam.  # Patient will likely need follow-up labs/ scale to be based upon today's numbers. She approximately see me back in 3 months or sooner based upon her platelet trend.  Thank you Dr. Gilford Rile  for allowing me to participate in the care of your pleasant patient. Please do not hesitate to contact me with questions or concerns in the interim.  # 30 minutes face-to-face with the patient discussing the above plan of care; more than 50% of time spent on counseling and coordination.      Cammie Sickle, MD 09/02/2015 11:27 AM

## 2015-09-02 NOTE — Progress Notes (Signed)
Platelets are 74/fairly stable from 76 from 10 days ago. For now monitor with CBC on a weekly basis 4. Further recommendations to follow based on platelet trend.

## 2015-09-02 NOTE — Progress Notes (Signed)
Patient here today as new evaluation regarding thrombocytopenia.  Referred by Dr. Ronette Deter.  Patient states she is having a lot of leg cramps.

## 2015-09-05 ENCOUNTER — Inpatient Hospital Stay: Payer: BLUE CROSS/BLUE SHIELD

## 2015-09-05 ENCOUNTER — Inpatient Hospital Stay (HOSPITAL_BASED_OUTPATIENT_CLINIC_OR_DEPARTMENT_OTHER): Payer: BLUE CROSS/BLUE SHIELD | Admitting: Internal Medicine

## 2015-09-05 ENCOUNTER — Encounter: Payer: Self-pay | Admitting: *Deleted

## 2015-09-05 VITALS — BP 143/83 | HR 105 | Temp 97.2°F | Resp 18 | Wt 171.7 lb

## 2015-09-05 DIAGNOSIS — R591 Generalized enlarged lymph nodes: Secondary | ICD-10-CM

## 2015-09-05 DIAGNOSIS — D696 Thrombocytopenia, unspecified: Secondary | ICD-10-CM

## 2015-09-05 DIAGNOSIS — Z79899 Other long term (current) drug therapy: Secondary | ICD-10-CM | POA: Diagnosis not present

## 2015-09-05 DIAGNOSIS — R161 Splenomegaly, not elsewhere classified: Secondary | ICD-10-CM

## 2015-09-05 LAB — COMPREHENSIVE METABOLIC PANEL
ALBUMIN: 4.8 g/dL (ref 3.5–5.0)
ALK PHOS: 98 U/L (ref 38–126)
ALT: 36 U/L (ref 14–54)
ANION GAP: 17 — AB (ref 5–15)
AST: 37 U/L (ref 15–41)
BUN: 17 mg/dL (ref 6–20)
CALCIUM: 9.7 mg/dL (ref 8.9–10.3)
CO2: 18 mmol/L — AB (ref 22–32)
Chloride: 101 mmol/L (ref 101–111)
Creatinine, Ser: 0.89 mg/dL (ref 0.44–1.00)
GFR calc non Af Amer: >60 mL/min (ref >60)
GLUCOSE: 83 mg/dL (ref 65–99)
POTASSIUM: 4.2 mmol/L (ref 3.5–5.1)
SODIUM: 136 mmol/L (ref 135–145)
Total Bilirubin: 0.7 mg/dL (ref 0.3–1.2)
Total Protein: 6.9 g/dL (ref 6.5–8.1)

## 2015-09-05 LAB — CBC WITH DIFFERENTIAL/PLATELET
Basophils Absolute: 0.2 10*3/uL — ABNORMAL HIGH (ref 0–0.1)
Basophils Relative: 3 %
EOS ABS: 0.2 10*3/uL (ref 0–0.7)
Eosinophils Relative: 3 %
HCT: 40.2 % (ref 35.0–47.0)
Hemoglobin: 13.6 g/dL (ref 12.0–16.0)
LYMPHS ABS: 0.5 10*3/uL — AB (ref 1.0–3.6)
Lymphocytes Relative: 9 %
MCH: 28.4 pg (ref 26.0–34.0)
MCHC: 33.9 g/dL (ref 32.0–36.0)
MCV: 83.8 fL (ref 80.0–100.0)
METAMYELOCYTES PCT: 1 %
MONO ABS: 2.9 10*3/uL — AB (ref 0.2–0.9)
Monocytes Relative: 48 %
NEUTROS ABS: 2.3 10*3/uL (ref 1.4–6.5)
Neutrophils Relative %: 36 %
PLATELETS: 65 10*3/uL — AB (ref 150–440)
RBC: 4.8 MIL/uL (ref 3.80–5.20)
RDW: 16 % — AB (ref 11.5–14.5)
WBC: 6.1 10*3/uL (ref 3.6–11.0)

## 2015-09-05 LAB — FIBRINOGEN: FIBRINOGEN: 229 mg/dL (ref 210–470)

## 2015-09-05 LAB — PROTIME-INR
INR: 1.02
PROTHROMBIN TIME: 13.6 s (ref 11.4–15.0)

## 2015-09-05 LAB — APTT: aPTT: <24 seconds — ABNORMAL LOW (ref 24–36)

## 2015-09-05 LAB — FIBRIN DERIVATIVES D-DIMER (ARMC ONLY): Fibrin derivatives D-dimer (ARMC): 847 — ABNORMAL HIGH (ref 0–499)

## 2015-09-05 NOTE — Progress Notes (Signed)
[facility] NOTE  Patient Care Team: Jackolyn Confer, MD as PCP - General (Internal Medicine)  CHIEF COMPLAINTS/PURPOSE OF CONSULTATION:   # MARCH 2017- ISOLATED THROMBOCYTOPENIA [54 to 72]; review of smear- "abnormal looking/immature white cells".   HISTORY OF PRESENTING ILLNESS:  Stacy Murillo 60 y.o.  female was recently seen last week for isolated thrombocytopenia. Platelets are 72 however LDH was elevated 802. Review of smear showed abnormal white cells. She is here to review her labs/next plan of care.  She states the leg cramps improved. Continues to feel fatigued. Intermittent palpitations.  Admits to mild bruising; however no epistaxis or gum bleeding. No rectal bleeding.   ROS: A complete 10 point review of system is done which is negative except mentioned above in history of present illness  MEDICAL HISTORY:  Past Medical History  Diagnosis Date  . Tachycardia   . Overweight(278.02)     Obesity  . Chest pain, unspecified   . Palpitations   . Osteoporosis   . Thrombocytopenia ([place])     SURGICAL HISTORY: Past Surgical History  Procedure Laterality Date  . Cholecystectomy  1995  . Abdominal hysterectomy  2005    SOCIAL HISTORY: Social History   Social History  . Marital Status: Married    Spouse Name: N/A  . Number of Children: N/A  . Years of Education: N/A   Occupational History  . Not on file.   Social History Main Topics  . Smoking status: Never Smoker   . Smokeless tobacco: Never Used  . Alcohol Use: No  . Drug Use: No  . Sexual Activity: Not on file   Other Topics Concern  . Not on file   Social History Narrative   Married   Does not get regular exercise    FAMILY HISTORY: Family History  Problem Relation Age of Onset  . Heart disease Neg Hx   . Diabetes Neg Hx     ALLERGIES:  has No Known Allergies.  MEDICATIONS:  Current Outpatient Prescriptions  Medication Sig Dispense Refill  . Alum Hydroxide-Mag  Carbonate (GAVISCON PO) Take by mouth.    . Calcium Carbonate-Vit D-Min (CALCIUM 1200 PO) Take by mouth 2 (two) times daily.    . Cholecalciferol (CVS VIT D 5000 HIGH-POTENCY PO) Take by mouth.    . Probiotic Product (ALIGN PO) Take by mouth.    . ranitidine (ZANTAC) 150 MG tablet Take by mouth.     No current facility-administered medications for this visit.      Marland Kitchen  PHYSICAL EXAMINATION:   Filed Vitals:   09/05/15 1041  BP: 143/83  Pulse: 105  Temp: 97.2 F (36.2 C)  Resp: 18   Filed Weights   09/05/15 1041  Weight: 171 lb 11.8 oz (77.9 kg)    GENERAL: Well-nourished well-developed; Alert, no distress and comfortable.  Alone. EYES: no pallor or icterus OROPHARYNX: no thrush or ulceration; good dentition  NECK: supple, no masses felt LYMPH:  no palpable lymphadenopathy in the cervical, axillary or inguinal regions LUNGS: clear to auscultation and  No wheeze or crackles HEART/CVS: regular rate & rhythm and no murmurs; No lower extremity edema ABDOMEN: abdomen soft, non-tender and normal bowel sounds Musculoskeletal:no cyanosis of digits and no clubbing  PSYCH: alert & oriented x 3 with fluent speech NEURO: no focal motor/sensory deficits SKIN:  no rashes or significant lesions  LABORATORY DATA:  I have reviewed the data as listed Lab Results  Component Value Date   WBC 5.0  09/02/2015   HGB 13.0 09/02/2015   HCT 38.1 09/02/2015   MCV 82.7 09/02/2015   PLT 74* 09/02/2015    Recent Labs  08/14/15 1709 08/24/15 1141  NA 134* 140  K 4.2 4.2  CL 99* 99  CO2 23 26  GLUCOSE 81 85  BUN 15 17  CREATININE 0.94 0.90  CALCIUM 9.2 10.2  GFRNONAA >60  --   GFRAA >60  --   PROT  --  6.6  ALBUMIN  --  4.3  AST  --  42*  ALT  --  52*  ALKPHOS  --  111  BILITOT  --  0.6    RADIOGRAPHIC STUDIES: I have personally reviewed the radiological images as listed and agreed with the findings in the report. Dg Chest 2 View  08/14/2015  CLINICAL DATA:  Irregular heartbeat  for 2 weeks.  Weakness. EXAM: CHEST  2 VIEW COMPARISON:  None. FINDINGS: The cardiac silhouette, mediastinal and hilar contours are normal. The lungs are clear. No pleural effusion. The bony thorax is intact. IMPRESSION: No acute cardiopulmonary findings. Electronically Signed   By: Marijo Sanes M.D.   On: 08/14/2015 17:36    ASSESSMENT & PLAN:   # ISOLATED THROMBOCYTOPENIA;Normal white count hemoglobin. However review of peripheral smear shows abnormal/immature blood cells- concerning for leukemia. I reviewed my concerns with the patient and has been in detail. Recommend- peripheral blood flow cytometry; DAC panel PT PTT CMP. Get CT chest and pelvis to rule out lymphadenopathy/hepatosplenomegaly.   # I also reviewed the importance of a bone marrow biopsy- discussed the procedure; potential complications of bleeding pain and infection. Interested in having it done under anesthesia/ will have it done through radiology.  # Will tentatively see the patient back in approximately 1 week/ her sooner based upon above workup. Patient plans to go out of town on May 10 weekend.   # 15 minutes face-to-face with the patient discussing the above plan of care; more than 50% of time spent on natural history; counseling and coordination.     Cammie Sickle, MD 09/05/2015 11:00 AM

## 2015-09-05 NOTE — Patient Instructions (Signed)
Bone Marrow Aspiration and Bone Marrow Biopsy Bone marrow aspiration and bone marrow biopsy are procedures that are done to diagnose blood disorders. You may also have one of these procedures to help diagnose infections or some types of cancer. Bone marrow is the soft tissue that is inside your bones. Blood cells are produced in bone marrow. For bone marrow aspiration, a sample of tissue in liquid form is removed from inside your bone. For a bone marrow biopsy, a small core of bone marrow tissue is removed. Then these samples are examined under a microscope or tested in a lab. You may need these procedures if you have an abnormal complete blood count (CBC). The aspiration or biopsy sample is usually taken from the top of your hip bone. Sometimes, an aspiration sample is taken from your chest bone (sternum). LET [facility] CARE PROVIDER KNOW ABOUT:  Any allergies you have.  All medicines you are taking, including vitamins, herbs, eye drops, creams, and over-the-counter medicines.  Previous problems you or members of your family have had with the use of anesthetics.  Any blood disorders you have.  Previous surgeries you have had.  Any medical conditions you may have.  Whether you are pregnant or you think that you may be pregnant. RISKS AND COMPLICATIONS Generally, this is a safe procedure. However, problems may occur, including:  Infection.  Bleeding. BEFORE THE PROCEDURE  Ask your health care provider about:  Changing or stopping your regular medicines. This is especially important if you are taking diabetes medicines or blood thinners.  Taking medicines such as aspirin and ibuprofen. These medicines can thin your blood. Do not take these medicines before your procedure if your health care provider instructs you not to.  Plan to have someone take you home after the procedure.  If you go home right after the procedure, plan to have someone with you for 24 hours. PROCEDURE   An  IV tube may be inserted into one of your veins.  The injection site will be cleaned with a germ-killing solution (antiseptic).  You will be given one or more of the following:  A medicine that helps you relax (sedative).  A medicine that numbs the area (local anesthetic).  The bone marrow sample will be removed as follows:  For an aspiration, a hollow needle will be inserted through your skin and into your bone. Bone marrow fluid will be drawn up into a syringe.  For a biopsy, your health care provider will use a hollow needle to remove a core of tissue from your bone marrow.  The needle will be removed.  A bandage (dressing) will be placed over the insertion site and taped in place. The procedure may vary among health care providers and hospitals. AFTER THE PROCEDURE  Your blood pressure, heart rate, breathing rate, and blood oxygen level will be monitored often until the medicines you were given have worn off.  Return to your normal activities as directed by your health care provider.   This information is not intended to replace advice given to you by your health care provider. Make sure you discuss any questions you have with your health care provider.   Document Released: 05/03/2004 Document Revised: 09/14/2014 Document Reviewed: 04/21/2014 Elsevier Interactive Patient Education [employer].

## 2015-09-07 LAB — COMP PANEL: LEUKEMIA/LYMPHOMA

## 2015-09-08 ENCOUNTER — Encounter: Payer: Self-pay | Admitting: *Deleted

## 2015-09-08 ENCOUNTER — Other Ambulatory Visit: Payer: BLUE CROSS/BLUE SHIELD

## 2015-09-08 NOTE — Progress Notes (Signed)
Bone marrow biopsy not able to be performed until next Tuesday, May 2. CT scanner is currently in maintenance. Unable to get pt on with a sooner apt in [facility] in [place].  RN spoke with md- Per Dr. Rogue Bussing, md ask that pt please keep apt for bone marrow biopsy next weeek instead of going to [facility]. Msg sent to cancer center scheduling to let pt to know to keep the ct scan for Friday.  Per MD--We may need to post pone her md apt on Monday by 4-5 days. I will contact the patient on Monday morning if we need to do this.  MD wants to make sure he has the ct scan reports before pt comes to apt.

## 2015-09-09 ENCOUNTER — Ambulatory Visit
Admission: RE | Admit: 2015-09-09 | Discharge: 2015-09-09 | Disposition: A | Discharge status: Home / Self Care | Payer: BLUE CROSS/BLUE SHIELD | Source: Ambulatory Visit | Attending: Internal Medicine | Admitting: Internal Medicine

## 2015-09-09 DIAGNOSIS — R591 Generalized enlarged lymph nodes: Secondary | ICD-10-CM | POA: Diagnosis present

## 2015-09-09 DIAGNOSIS — R161 Splenomegaly, not elsewhere classified: Secondary | ICD-10-CM | POA: Diagnosis present

## 2015-09-09 DIAGNOSIS — K5730 Diverticulosis of large intestine without perforation or abscess without bleeding: Secondary | ICD-10-CM | POA: Diagnosis not present

## 2015-09-09 DIAGNOSIS — D696 Thrombocytopenia, unspecified: Secondary | ICD-10-CM | POA: Diagnosis not present

## 2015-09-09 DIAGNOSIS — R590 Localized enlarged lymph nodes: Secondary | ICD-10-CM | POA: Diagnosis not present

## 2015-09-09 MED ORDER — IOPAMIDOL (ISOVUE-300) INJECTION 61%
100.0000 mL | Freq: Once | INTRAVENOUS | Status: AC | PRN
  Administered 2015-09-09: 100 mL via INTRAVENOUS

## 2015-09-12 ENCOUNTER — Other Ambulatory Visit: Payer: Self-pay | Admitting: Radiology

## 2015-09-12 ENCOUNTER — Inpatient Hospital Stay: Payer: BLUE CROSS/BLUE SHIELD | Admitting: Internal Medicine

## 2015-09-12 ENCOUNTER — Inpatient Hospital Stay: Payer: BLUE CROSS/BLUE SHIELD

## 2015-09-12 ENCOUNTER — Other Ambulatory Visit: Payer: Self-pay | Admitting: General Surgery

## 2015-09-13 ENCOUNTER — Ambulatory Visit: Admission: RE | Admit: 2015-09-13 | Payer: BLUE CROSS/BLUE SHIELD | Source: Ambulatory Visit

## 2015-09-13 ENCOUNTER — Encounter (HOSPITAL_COMMUNITY): Payer: Self-pay

## 2015-09-13 ENCOUNTER — Ambulatory Visit (HOSPITAL_COMMUNITY)
Admission: RE | Admit: 2015-09-13 | Discharge: 2015-09-13 | Disposition: A | Discharge status: Home / Self Care | Payer: BLUE CROSS/BLUE SHIELD | Source: Ambulatory Visit | Attending: Internal Medicine | Admitting: Internal Medicine

## 2015-09-13 DIAGNOSIS — D696 Thrombocytopenia, unspecified: Secondary | ICD-10-CM

## 2015-09-13 DIAGNOSIS — D72819 Decreased white blood cell count, unspecified: Secondary | ICD-10-CM | POA: Insufficient documentation

## 2015-09-13 LAB — CBC
HCT: 37 % (ref 36.0–46.0)
Hemoglobin: 12.5 g/dL (ref 12.0–15.0)
MCH: 28.2 pg (ref 26.0–34.0)
MCHC: 33.8 g/dL (ref 30.0–36.0)
MCV: 83.3 fL (ref 78.0–100.0)
Platelets: 54 10*3/uL — ABNORMAL LOW (ref 150–400)
RBC: 4.44 MIL/uL (ref 3.87–5.11)
RDW: 16 % — AB (ref 11.5–15.5)
WBC: 3.5 10*3/uL — ABNORMAL LOW (ref 4.0–10.5)

## 2015-09-13 LAB — PROTIME-INR
INR: 1.02 (ref 0.00–1.49)
PROTHROMBIN TIME: 13.2 s (ref 11.6–15.2)

## 2015-09-13 LAB — BONE MARROW EXAM

## 2015-09-13 LAB — APTT: APTT: 23 s — AB (ref 24–37)

## 2015-09-13 MED ORDER — SODIUM CHLORIDE 0.9 % IV SOLN
Freq: Once | INTRAVENOUS | Status: AC
  Administered 2015-09-13: 08:00:00 via INTRAVENOUS

## 2015-09-13 MED ORDER — FENTANYL CITRATE (PF) 100 MCG/2ML IJ SOLN
INTRAMUSCULAR | Status: AC | PRN
  Administered 2015-09-13: 25 ug via INTRAVENOUS
  Administered 2015-09-13: 50 ug via INTRAVENOUS

## 2015-09-13 MED ORDER — FENTANYL CITRATE (PF) 100 MCG/2ML IJ SOLN
INTRAMUSCULAR | Status: AC
  Filled 2015-09-13: qty 2

## 2015-09-13 MED ORDER — MIDAZOLAM HCL 2 MG/2ML IJ SOLN
INTRAMUSCULAR | Status: AC | PRN
  Administered 2015-09-13: 0.5 mg via INTRAVENOUS
  Administered 2015-09-13: 1 mg via INTRAVENOUS
  Administered 2015-09-13: 0.5 mg via INTRAVENOUS

## 2015-09-13 MED ORDER — MIDAZOLAM HCL 2 MG/2ML IJ SOLN
INTRAMUSCULAR | Status: AC
  Filled 2015-09-13: qty 4

## 2015-09-13 NOTE — Sedation Documentation (Signed)
Patient denies pain and is resting comfortably.  

## 2015-09-13 NOTE — Procedures (Signed)
Interventional Radiology Procedure Note  Procedure: CT guided aspirate and core biopsy of right iliac bone Complications: None Recommendations: - Bedrest supine x 1 hrs - Hydrocodone PRN  Pain - Follow biopsy results  Shaleigh Laubscher T. Krystyl Cannell, M.D Pager:  [phone]   

## 2015-09-13 NOTE — H&P (Signed)
Chief Complaint: thrombocytopenia  Referring Physician:Dr. Charlaine Dalton  Supervising Physician: Aletta Edouard  Patient Status: Out-pt  HPI: Stacy Murillo is an 60 y.o. female who went to the ED about a month and a half ago with a new onset rapid heart rate.  She went to see her PCP in follow up and had some lab work done.  This revealed thrombocytopenia.  She was referred to a hematologist who has been working her up.  She was felt to need a bone marrow biopsy and is here today for this procedure.  She has been having weakness, tiredness, and leg cramps.  She has also been having some shortness of breath during her rapid heart rate occurences.  Otherwise, she has no other complaints.  Past Medical History:  Past Medical History  Diagnosis Date  . Tachycardia   . Overweight(278.02)     Obesity  . Chest pain, unspecified   . Palpitations   . Osteoporosis   . Thrombocytopenia ([place])     Past Surgical History:  Past Surgical History  Procedure Laterality Date  . Cholecystectomy  1995  . Abdominal hysterectomy  2005    Family History:  Family History  Problem Relation Age of Onset  . Heart disease Neg Hx   . Diabetes Neg Hx     Social History:  reports that she has never smoked. She has never used smokeless tobacco. She reports that she does not drink alcohol or use illicit drugs.  Allergies: No Known Allergies  Medications:   Medication List    ASK your doctor about these medications        ALIGN PO  Take by mouth.     CALCIUM 1200 PO  Take by mouth 2 (two) times daily.     CVS VIT D 5000 HIGH-POTENCY PO  Take by mouth.     GAVISCON PO  Take by mouth.     ranitidine 150 MG tablet  Commonly known as:  ZANTAC  Take by mouth.        Please HPI for pertinent positives, otherwise complete 10 system ROS negative.  Mallampati Score: MD Evaluation Airway: WNL Heart: WNL Abdomen: WNL Chest/ Lungs: WNL ASA  Classification: 2 Mallampati/Airway  Score: One  Physical Exam: There were no vitals taken for this visit. There is no weight on file to calculate BMI. General: pleasant, WD, WN white female who is laying in bed in NAD HEENT: head is normocephalic, atraumatic.  Sclera are noninjected.  PERRL.  Ears and nose without any masses or lesions.  Mouth is pink and moist Heart: regular, rate, and rhythm.  Normal s1,s2. No obvious murmurs, gallops, or rubs noted.  Palpable radial and pedal pulses bilaterally Lungs: CTAB, no wheezes, rhonchi, or rales noted.  Respiratory effort nonlabored Abd: soft, NT, ND, +BS, no masses, hernias, or organomegaly MS: all 4 extremities are symmetrical with no cyanosis, clubbing, or edema. Skin: warm and dry with no masses, lesions, or rashes Psych: A&Ox3 with an appropriate affect.   Labs: Results for orders placed or performed during the hospital encounter of 09/13/15 (from the past 48 hour(s))  APTT upon arrival     Status: Abnormal   Collection Time: 09/13/15  7:45 AM  Result Value Ref Range   aPTT 23 (L) 24 - 37 seconds  CBC upon arrival     Status: Abnormal   Collection Time: 09/13/15  7:45 AM  Result Value Ref Range   WBC 3.5 (L) 4.0 - 10.5 K/uL  RBC 4.44 3.87 - 5.11 MIL/uL   Hemoglobin 12.5 12.0 - 15.0 g/dL   HCT 37.0 36.0 - 46.0 %   MCV 83.3 78.0 - 100.0 fL   MCH 28.2 26.0 - 34.0 pg   MCHC 33.8 30.0 - 36.0 g/dL   RDW 16.0 (H) 11.5 - 15.5 %   Platelets 54 (L) 150 - 400 K/uL    Comment: SPECIMEN CHECKED FOR CLOTS PLATELET COUNT CONFIRMED BY SMEAR   Protime-INR upon arrival     Status: None   Collection Time: 09/13/15  7:45 AM  Result Value Ref Range   Prothrombin Time 13.2 11.6 - 15.2 seconds   INR 1.02 0.00 - 1.49    Imaging: No results found.  Assessment/Plan 1. Thrombocytopenia -will plan to proceed with a BMBX today -labs and vitals have been reviewed -Risks and Benefits discussed with the patient including, but not limited to bleeding, infection, damage to adjacent  structures or low yield requiring additional tests. All of the patient's questions were answered, patient is agreeable to proceed. Consent signed and in chart.    Thank you for this interesting consult.  I greatly enjoyed meeting Stacy Murillo and look forward to participating in their care.  A copy of this report was sent to the requesting provider on this date.  Electronically Signed: Henreitta Cea 09/13/2015, 8:50 AM   I spent a total of  30 Minutes   in face to face in clinical consultation, greater than 50% of which was counseling/coordinating care for thrombocytopenia, BMBX

## 2015-09-13 NOTE — Discharge Instructions (Signed)
Moderate Conscious Sedation, Adult, Care After °Refer to this sheet in the next few weeks. These instructions provide you with information on caring for yourself after your procedure. Your health care provider may also give you more specific instructions. Your treatment has been planned according to current medical practices, but problems sometimes occur. Call your health care provider if you have any problems or questions after your procedure. °WHAT TO EXPECT AFTER THE PROCEDURE  °After your procedure: °· You may feel sleepy, clumsy, and have poor balance for several hours. °· Vomiting may occur if you eat too soon after the procedure. °HOME CARE INSTRUCTIONS °· Do not participate in any activities where you could become injured for at least 24 hours. Do not: °¨ Drive. °¨ Swim. °¨ Ride a bicycle. °¨ Operate heavy machinery. °¨ Cook. °¨ Use power tools. °¨ Climb ladders. °¨ Work from a high place. °· Do not make important decisions or sign legal documents until you are improved. °· If you vomit, drink water, juice, or soup when you can drink without vomiting. Make sure you have little or no nausea before eating solid foods. °· Only take over-the-counter or prescription medicines for pain, discomfort, or fever as directed by your health care provider. °· Make sure you and your family fully understand everything about the medicines given to you, including what side effects may occur. °· You should not drink alcohol, take sleeping pills, or take medicines that cause drowsiness for at least 24 hours. °· If you smoke, do not smoke without supervision. °· If you are feeling better, you may resume normal activities 24 hours after you were sedated. °· Keep all appointments with your health care provider. °SEEK MEDICAL CARE IF: °· Your skin is pale or bluish in color. °· You continue to feel nauseous or vomit. °· Your pain is getting worse and is not helped by medicine. °· You have bleeding or swelling. °· You are still  sleepy or feeling clumsy after 24 hours. °SEEK IMMEDIATE MEDICAL CARE IF: °· You develop a rash. °· You have difficulty breathing. °· You develop any type of allergic problem. °· You have a fever. °MAKE SURE YOU: °· Understand these instructions. °· Will watch your condition. °· Will get help right away if you are not doing well or get worse. °  °This information is not intended to replace advice given to you by your health care provider. Make sure you discuss any questions you have with your health care provider. °  °Document Released: 02/18/2013 Document Revised: 05/21/2014 Document Reviewed: 02/18/2013 °Elsevier Interactive Patient Education ©2016 Elsevier Inc[employer] °Bone Marrow Aspiration and Bone Marrow Biopsy, Care After °Refer to this sheet in the next few weeks. These instructions provide you with information about caring for yourself after your procedure. Your health care provider may also give you more specific instructions. Your treatment has been planned according to current medical practices, but problems sometimes occur. Call your health care provider if you have any problems or questions after your procedure. °WHAT TO EXPECT AFTER THE PROCEDURE °After your procedure, it is common to have: °· Soreness or tenderness around the puncture site. °· Bruising. °HOME CARE INSTRUCTIONS °· Take medicines only as directed by your health care provider. °· Follow your health care provider's instructions about: °¨ Puncture site care. °¨ Bandage (dressing) changes and removal. °· Bathe and shower as directed by your health care provider. °· Check your puncture site every day for signs of infection. Watch for: °¨ Redness, swelling, or pain. °¨   Fluid, blood, or pus. °· Return to your normal activities as directed by your health care provider. °· Keep all follow-up visits as directed by your health care provider. This is important. °SEEK MEDICAL CARE IF: °· You have a fever. °· You have uncontrollable bleeding. °· You have  redness, swelling, or pain at the site of your puncture. °· You have fluid, blood, or pus coming from your puncture site. °  °This information is not intended to replace advice given to you by your health care provider. Make sure you discuss any questions you have with your health care provider. °  °Document Released: 11/17/2004 Document Revised: 09/14/2014 Document Reviewed: 04/21/2014 °Elsevier Interactive Patient Education ©2016 Elsevier Inc. ° °

## 2015-09-14 ENCOUNTER — Encounter: Payer: Self-pay | Admitting: Cardiology

## 2015-09-14 ENCOUNTER — Ambulatory Visit (INDEPENDENT_AMBULATORY_CARE_PROVIDER_SITE_OTHER): Payer: BLUE CROSS/BLUE SHIELD | Admitting: Cardiology

## 2015-09-14 VITALS — BP 152/70 | HR 92 | Ht 62.0 in | Wt 166.5 lb

## 2015-09-14 DIAGNOSIS — C8590 Non-Hodgkin lymphoma, unspecified, unspecified site: Secondary | ICD-10-CM

## 2015-09-14 DIAGNOSIS — R0602 Shortness of breath: Secondary | ICD-10-CM | POA: Diagnosis not present

## 2015-09-14 DIAGNOSIS — R002 Palpitations: Secondary | ICD-10-CM | POA: Diagnosis not present

## 2015-09-14 DIAGNOSIS — R0789 Other chest pain: Secondary | ICD-10-CM

## 2015-09-14 DIAGNOSIS — R5383 Other fatigue: Secondary | ICD-10-CM | POA: Diagnosis not present

## 2015-09-14 HISTORY — DX: Non-Hodgkin lymphoma, unspecified, unspecified site: C85.90

## 2015-09-14 HISTORY — PX: BONE MARROW BIOPSY: SHX199

## 2015-09-14 NOTE — Patient Instructions (Addendum)
Medication Instructions:  Your physician recommends that you continue on your current medications as directed. Please refer to the Current Medication list given to you today.   Labwork: None ordered   Testing/Procedures: Your physician has requested that you have an echocardiogram. Echocardiography is a painless test that uses sound waves to create images of your heart. It provides your doctor with information about the size and shape of your heart and how well your heart's chambers and valves are working. This procedure takes approximately one hour. There are no restrictions for this procedure.  Date & Time: _____________________________________________________________________  Your physician has recommended that you wear a holter monitor. Holter monitors are medical devices that record the heart's electrical activity. Doctors most often use these monitors to diagnose arrhythmias. Arrhythmias are problems with the speed or rhythm of the heartbeat. The monitor is a small, portable device. You can wear one while you do your normal daily activities. This is usually used to diagnose what is causing palpitations/syncope (passing out).  Date & Time: _______________________________________________________________________        [facility]  Your caregiver has ordered a Stress Test with nuclear imaging. The purpose of this test is to evaluate the blood supply to your heart muscle. This procedure is referred to as a "Non-Invasive Stress Test." This is because other than having an IV started in your vein, nothing is inserted or "invades" your body. Cardiac stress tests are done to find areas of poor blood flow to the heart by determining the extent of coronary artery disease (CAD). Some patients exercise on a treadmill, which naturally increases the blood flow to your heart, while others who are  unable to walk on a treadmill due to physical limitations have a pharmacologic/chemical stress agent  called Lexiscan . This medicine will mimic walking on a treadmill by temporarily increasing your coronary blood flow.   Please note: these test may take anywhere between 2-4 hours to complete  PLEASE REPORT TO [place] AT THE FIRST DESK WILL DIRECT YOU WHERE TO GO  Date of Procedure:_Tuesday Sep 27, 2015 at 07:30AM____  Arrival Time for Procedure:___Arrive at 07:15 AM to register_____    PLEASE NOTIFY THE OFFICE AT LEAST 24 HOURS IN ADVANCE IF YOU ARE UNABLE TO KEEP YOUR APPOINTMENT.  [phone] AND  PLEASE NOTIFY NUCLEAR MEDICINE AT [facility] AT LEAST 24 HOURS IN ADVANCE IF YOU ARE UNABLE TO KEEP YOUR APPOINTMENT. [phone]  How to prepare for your Myoview test:   Do not eat or drink after midnight  No caffeine for 24 hours prior to test  No smoking 24 hours prior to test.  Your medication may be taken with water.  If your doctor stopped a medication because of this test, do not take that medication.  Ladies, please do not wear dresses.  Skirts or pants are appropriate. Please wear a short sleeve shirt.  No perfume, cologne or lotion.  Wear comfortable walking shoes. No heels!     Follow-Up: Your physician recommends that you schedule a follow-up appointment after testing to review results.  Date & Time: ___________________________________________________________________   Any Other Special Instructions Will Be Listed Below (If Applicable).     If you need a refill on your cardiac medications before your next appointment, please call your pharmacy.    Echocardiogram An echocardiogram, or echocardiography, uses sound waves (ultrasound) to produce an image of your heart. The echocardiogram is simple, painless, obtained within a short period of time, and offers valuable  information to your health care provider. The images from an echocardiogram can provide information such as:  Evidence of coronary artery disease (CAD).  Heart  size.  Heart muscle function.  Heart valve function.  Aneurysm detection.  Evidence of a past heart attack.  Fluid buildup around the heart.  Heart muscle thickening.  Assess heart valve function. LET [facility] CARE PROVIDER KNOW ABOUT:  Any allergies you have.  All medicines you are taking, including vitamins, herbs, eye drops, creams, and over-the-counter medicines.  Previous problems you or members of your family have had with the use of anesthetics.  Any blood disorders you have.  Previous surgeries you have had.  Medical conditions you have.  Possibility of pregnancy, if this applies. BEFORE THE PROCEDURE  No special preparation is needed. Eat and drink normally.  PROCEDURE   In order to produce an image of your heart, gel will be applied to your chest and a wand-like tool (transducer) will be moved over your chest. The gel will help transmit the sound waves from the transducer. The sound waves will harmlessly bounce off your heart to allow the heart images to be captured in real-time motion. These images will then be recorded.  You may need an IV to receive a medicine that improves the quality of the pictures. AFTER THE PROCEDURE You may return to your normal schedule including diet, activities, and medicines, unless your health care provider tells you otherwise.   This information is not intended to replace advice given to you by your health care provider. Make sure you discuss any questions you have with your health care provider.   Document Released: 04/27/2000 Document Revised: 05/21/2014 Document Reviewed: 01/05/2013 Elsevier Interactive Patient Education 2016 [employer]   Holter Monitoring A Holter monitor is a small device that is used to detect abnormal heart rhythms. It clips to your clothing and is connected by wires to flat, sticky disks (electrodes) that attach to your chest. It is worn continuously for 24-48 hours. HOME CARE  INSTRUCTIONS  Wear your Holter monitor at all times, even while exercising and sleeping, for as long as directed by your health care provider.  Make sure that the Holter monitor is safely clipped to your clothing or close to your body as recommended by your health care provider.  Do not get the monitor or wires wet.  Do not put body lotion or moisturizer on your chest.  Keep your skin clean.  Keep a diary of your daily activities, such as walking and doing chores. If you feel that your heartbeat is abnormal or that your heart is fluttering or skipping a beat:  Record what you are doing when it happens.  Record what time of day the symptoms occur.  Return your Holter monitor as directed by your health care provider.  Keep all follow-up visits as directed by your health care provider. This is important. SEEK IMMEDIATE MEDICAL CARE IF:  You feel lightheaded or you faint.  You have trouble breathing.  You feel pain in your chest, upper arm, or jaw.  You feel sick to your stomach and your skin is pale, cool, or damp.  You heartbeat feels unusual or abnormal.   This information is not intended to replace advice given to you by your health care provider. Make sure you discuss any questions you have with your health care provider.   Document Released: 01/27/2004 Document Revised: 05/21/2014 Document Reviewed: 12/07/2013 Elsevier Interactive Patient Education 2016 [employer].   Pharmacologic Stress  Electrocardiogram A pharmacologic stress electrocardiogram is a heart (cardiac) test that uses nuclear imaging to evaluate the blood supply to your heart. This test may also be called a pharmacologic stress electrocardiography. Pharmacologic means that a medicine is used to increase your heart rate and blood pressure.  This stress test is done to find areas of poor blood flow to the heart by determining the extent of coronary artery disease (CAD). Some people exercise on a treadmill,  which naturally increases the blood flow to the heart. For those people unable to exercise on a treadmill, a medicine is used. This medicine stimulates your heart and will cause your heart to beat harder and more quickly, as if you were exercising.  Pharmacologic stress tests can help determine:  The adequacy of blood flow to your heart during increased levels of activity in order to clear you for discharge home.  The extent of coronary artery blockage caused by CAD.  Your prognosis if you have suffered a heart attack.  The effectiveness of cardiac procedures done, such as an angioplasty, which can increase the circulation in your coronary arteries.  Causes of chest pain or pressure. LET [facility] CARE PROVIDER KNOW ABOUT:  Any allergies you have.  All medicines you are taking, including vitamins, herbs, eye drops, creams, and over-the-counter medicines.  Previous problems you or members of your family have had with the use of anesthetics.  Any blood disorders you have.  Previous surgeries you have had.  Medical conditions you have.  Possibility of pregnancy, if this applies.  If you are currently breastfeeding. RISKS AND COMPLICATIONS Generally, this is a safe procedure. However, as with any procedure, complications can occur. Possible complications include:  You develop pain or pressure in the following areas:  Chest.  Jaw or neck.  Between your shoulder blades.  Radiating down your left arm.  Headache.  Dizziness or light-headedness.  Shortness of breath.  Increased or irregular heartbeat.  Low blood pressure.  Nausea or vomiting.  Flushing.  Redness going up the arm and slight pain during injection of medicine.  Heart attack (rare). BEFORE THE PROCEDURE   Avoid all forms of caffeine for 24 hours before your test or as directed by your health care provider. This includes coffee, tea (even decaffeinated tea), caffeinated sodas, chocolate, cocoa, and  certain pain medicines.  Follow your health care provider's instructions regarding eating and drinking before the test.  Take your medicines as directed at regular times with water unless instructed otherwise. Exceptions may include:  If you have diabetes, ask how you are to take your insulin or pills. It is common to adjust insulin dosing the morning of the test.  If you are taking beta-blocker medicines, it is important to talk to your health care provider about these medicines well before the date of your test. Taking beta-blocker medicines may interfere with the test. In some cases, these medicines need to be changed or stopped 24 hours or more before the test.  If you wear a nitroglycerin patch, it may need to be removed prior to the test. Ask your health care provider if the patch should be removed before the test.  If you use an inhaler for any breathing condition, bring it with you to the test.  If you are an outpatient, bring a snack so you can eat right after the stress phase of the test.  Do not smoke for 4 hours prior to the test or as directed by your health care provider.  Do not apply lotions, powders, creams, or oils on your chest prior to the test.  Wear comfortable shoes and clothing. Let your health care provider know if you were unable to complete or follow the preparations for your test. PROCEDURE   Multiple patches (electrodes) will be put on your chest. If needed, small areas of your chest may be shaved to get better contact with the electrodes. Once the electrodes are attached to your body, multiple wires will be attached to the electrodes, and your heart rate will be monitored.  An IV access will be started. A nuclear trace (isotope) is given. The isotope may be given intravenously, or it may be swallowed. Nuclear refers to several types of radioactive isotopes, and the nuclear isotope lights up the arteries so that the nuclear images are clear. The isotope is  absorbed by your body. This results in low radiation exposure.  A resting nuclear image is taken to show how your heart functions at rest.  A medicine is given through the IV access.  A second scan is done about 1 hour after the medicine injection and determines how your heart functions under stress.  During this stress phase, you will be connected to an electrocardiogram machine. Your blood pressure and oxygen levels will be monitored. AFTER THE PROCEDURE   Your heart rate and blood pressure will be monitored after the test.  You may return to your normal schedule, including diet,activities, and medicines, unless your health care provider tells you otherwise.   This information is not intended to replace advice given to you by your health care provider. Make sure you discuss any questions you have with your health care provider.   Document Released: 09/16/2008 Document Revised: 05/05/2013 Document Reviewed: 01/05/2013 Elsevier Interactive Patient Education [employer].

## 2015-09-14 NOTE — Progress Notes (Signed)
Cardiology Office Note   Date:  09/14/2015   ID:  Stacy Murillo, DOB [birth year], MRN [id number]  Referring Doctor:  Rica Mast, MD   Cardiologist:   [name], MD   Reason for consultation:  Chief Complaint  Patient presents with  . other    Ref by Dr. Gilford Rile for rapid heart beats and palpitations. Pt. c/o rapid heart beats and feeling fatigue, pt. was at [facility] ER about 1 month ago with rapid heart beats.       History of Present Illness: Stacy Murillo is a 60 y.o. female who presents for Palpitations or pounding heart beat. This has been going on for probably one to 2 months. Symptoms occur in the chest, nonradiating, moderate to severe intensity when it happens, may last hours at a time, randomly occurring. She would notice more of a heart pounding at night when she is resting.  When asked, patient does report history of chest pain and shortness of breath. These may have been going on for about the same time. She describes the chest pain as a tightness in her chest, mild to moderate in severity, lasting minutes at a time, nonradiating, randomly occurring. She has noted shortness of breath with exertion that has progressed since onset.  Patient denies fever, cough, colds, abdominal pain. No PND, orthopnea, edema.  ROS:  Please see the history of present illness. Aside from mentioned under HPI, all other systems are reviewed and negative.    Past Medical History  Diagnosis Date  . Tachycardia   . Overweight(278.02)     Obesity  . Chest pain, unspecified   . Palpitations   . Osteoporosis   . Thrombocytopenia [facility])     Past Surgical History  Procedure Laterality Date  . Cholecystectomy  1995  . Abdominal hysterectomy  2005     reports that she has never smoked. She has never used smokeless tobacco. She reports that she does not drink alcohol or use illicit drugs.   family history includes Heart disease in her father; Hypertension in her mother. There is  no history of Diabetes. Father had quadruple bypass likely in his 19s.  Current Outpatient Prescriptions  Medication Sig Dispense Refill  . Alum Hydroxide-Mag Carbonate (GAVISCON PO) Take by mouth.    . Calcium Carbonate-Vit D-Min (CALCIUM 1200 PO) Take by mouth 2 (two) times daily.    . Cholecalciferol ([employer] VIT D 5000 HIGH-POTENCY PO) Take by mouth.    . Probiotic Product (ALIGN PO) Take by mouth.    . ranitidine (ZANTAC) 150 MG tablet Take by mouth.     No current facility-administered medications for this visit.    Allergies: Review of patient's allergies indicates no known allergies.    PHYSICAL EXAM: VS:  BP 152/70 mmHg  Pulse 92  Ht 5\' 2"  (1.575 m)  Wt 166 lb 8 oz (75.524 kg)  BMI 30.45 kg/m2 , Body mass index is 30.45 kg/(m^2). Wt Readings from Last 3 Encounters:  09/14/15 166 lb 8 oz (75.524 kg)  09/05/15 171 lb 11.8 oz (77.9 kg)  09/02/15 171 lb 11.8 oz (77.9 kg)    GENERAL:  well developed, well nourished, obese, not in acute distress HEENT: normocephalic, pink conjunctivae, anicteric sclerae, no xanthelasma, normal dentition, oropharynx clear NECK:  no neck vein engorgement, JVP normal, no hepatojugular reflux, carotid upstroke brisk and symmetric, no bruit, no thyromegaly, no lymphadenopathy LUNGS:  good respiratory effort, clear to auscultation bilaterally CV:  PMI not displaced, no thrills, no  lifts, S1 and S2 within normal limits, no palpable S3 or S4, no murmurs, no rubs, no gallops ABD:  Soft, nontender, nondistended, normoactive bowel sounds, no abdominal aortic bruit, no hepatomegaly, no splenomegaly MS: nontender back, no kyphosis, no scoliosis, no joint deformities EXT:  2+ DP/PT pulses, no edema, no varicosities, no cyanosis, no clubbing SKIN: warm, nondiaphoretic, normal turgor, no ulcers NEUROPSYCH: alert, oriented to person, place, and time, sensory/motor grossly intact, normal mood, appropriate affect  Recent Labs: 08/14/2015: Magnesium 1.9 08/24/2015:  TSH 3.33 09/05/2015: ALT 36; BUN 17; Creatinine, Ser 0.89; Potassium 4.2; Sodium 136 09/13/2015: Hemoglobin 12.5; Platelets 54*   Lipid Panel    Component Value Date/Time   CHOL 220* 08/18/2014 0805   TRIG 142.0 08/18/2014 0805   HDL 48.80 08/18/2014 0805   CHOLHDL 5 08/18/2014 0805   VLDL 28.4 08/18/2014 0805   LDLCALC 143* 08/18/2014 0805   LDLDIRECT 139.5 07/17/2012 0957     Other studies Reviewed:  EKG:  The ekg from05/07/2015 was personally reviewed by me and it revealed sinus rhythm, 92 BPM  Additional studies/ records that were reviewed personally reviewed by me today include: None available   ASSESSMENT AND PLAN:  Palpitations Labs reviewed. TSH and electrolytes were within normal limits from April 2017. Recommend Holter monitor and echocardiogram.  Chest pain and shortness of breath Risk factors for CAD include age/postmenopausal state, hyperlipidemia, obesity. Patient says she cannot walk on the treadmill due to bilateral knee pains. Recommend pharmacologic stress test and echocardiogram.   Current medicines are reviewed at length with the patient today.  The patient does not have concerns regarding medicines.  Labs/ tests ordered today include:  Orders Placed This Encounter  Procedures  . NM Myocar Multi W/Spect W/Wall Motion / EF  . Holter monitor - 24 hour  . EKG 12-Lead  . ECHOCARDIOGRAM COMPLETE    I had a lengthy and detailed discussion with the patient regarding diagnoses, prognosis, diagnostic options, treatment options .   I counseled the patient on importance of lifestyle modification including heart healthy diet, regular physical activity, Once cardiac workup is done .   Disposition:   FU with undersigned after tests    Signed, [name], MD  09/14/2015 10:07 AM    [place]

## 2015-09-15 ENCOUNTER — Other Ambulatory Visit: Payer: BLUE CROSS/BLUE SHIELD

## 2015-09-19 ENCOUNTER — Encounter: Payer: Self-pay | Admitting: Internal Medicine

## 2015-09-19 ENCOUNTER — Inpatient Hospital Stay (HOSPITAL_BASED_OUTPATIENT_CLINIC_OR_DEPARTMENT_OTHER): Payer: BLUE CROSS/BLUE SHIELD | Admitting: Internal Medicine

## 2015-09-19 ENCOUNTER — Inpatient Hospital Stay: Payer: BLUE CROSS/BLUE SHIELD | Attending: Internal Medicine

## 2015-09-19 VITALS — BP 142/83 | HR 109 | Temp 98.5°F | Resp 18 | Wt 166.9 lb

## 2015-09-19 DIAGNOSIS — D696 Thrombocytopenia, unspecified: Secondary | ICD-10-CM | POA: Diagnosis not present

## 2015-09-19 DIAGNOSIS — R002 Palpitations: Secondary | ICD-10-CM | POA: Insufficient documentation

## 2015-09-19 DIAGNOSIS — C8333 Diffuse large B-cell lymphoma, intra-abdominal lymph nodes: Secondary | ICD-10-CM | POA: Diagnosis not present

## 2015-09-19 DIAGNOSIS — C8521 Mediastinal (thymic) large B-cell lymphoma, lymph nodes of head, face, and neck: Secondary | ICD-10-CM | POA: Insufficient documentation

## 2015-09-19 DIAGNOSIS — Z79899 Other long term (current) drug therapy: Secondary | ICD-10-CM | POA: Insufficient documentation

## 2015-09-19 DIAGNOSIS — E669 Obesity, unspecified: Secondary | ICD-10-CM | POA: Diagnosis not present

## 2015-09-19 DIAGNOSIS — K219 Gastro-esophageal reflux disease without esophagitis: Secondary | ICD-10-CM | POA: Diagnosis not present

## 2015-09-19 DIAGNOSIS — R161 Splenomegaly, not elsewhere classified: Secondary | ICD-10-CM

## 2015-09-19 DIAGNOSIS — Z9049 Acquired absence of other specified parts of digestive tract: Secondary | ICD-10-CM | POA: Diagnosis not present

## 2015-09-19 DIAGNOSIS — G971 Other reaction to spinal and lumbar puncture: Secondary | ICD-10-CM | POA: Diagnosis not present

## 2015-09-19 LAB — CBC WITH DIFFERENTIAL/PLATELET
BASOS ABS: 0.1 10*3/uL (ref 0–0.1)
Band Neutrophils: 4 %
Basophils Relative: 2 %
EOS PCT: 3 %
Eosinophils Absolute: 0.2 10*3/uL (ref 0–0.7)
HCT: 37 % (ref 35.0–47.0)
HEMOGLOBIN: 12.7 g/dL (ref 12.0–16.0)
Lymphocytes Relative: 24 %
Lymphs Abs: 1.3 10*3/uL (ref 1.0–3.6)
MCH: 29.1 pg (ref 26.0–34.0)
MCHC: 34.3 g/dL (ref 32.0–36.0)
MCV: 85 fL (ref 80.0–100.0)
METAMYELOCYTES PCT: 1 %
MYELOCYTES: 2 %
Monocytes Absolute: 1.5 10*3/uL — ABNORMAL HIGH (ref 0.2–0.9)
Monocytes Relative: 27 %
Neutro Abs: 2.3 10*3/uL (ref 1.4–6.5)
Neutrophils Relative %: 37 %
Platelets: 60 10*3/uL — ABNORMAL LOW (ref 150–440)
RBC: 4.36 MIL/uL (ref 3.80–5.20)
RDW: 16.5 % — ABNORMAL HIGH (ref 11.5–14.5)
SMEAR REVIEW: DECREASED
WBC: 5.4 10*3/uL (ref 3.6–11.0)
nRBC: 1 /100 WBC — ABNORMAL HIGH

## 2015-09-19 NOTE — Progress Notes (Signed)
Patient here for CT and Bone Marrow results.

## 2015-09-19 NOTE — Progress Notes (Signed)
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 CT- 1-2 CM LN subpectoral/medistinal/ retro-peritoneal/pelvic/ Right inguinal LN 1.6cm.    HISTORY OF PRESENTING ILLNESS:  Stacy Murillo 60 y.o.  female recently evaluated for thrombocytopenia is here to review the results of the bone marrow biopsy/CT scan.  Bone marrow biopsy was uneventful.  Patient continues to feel intermittent fatigue. She states her leg cramps are improved but not completely resolved.    Admits to mild bruising; however no epistaxis or gum bleeding. No rectal bleeding.   ROS: A complete 10 point review of system is done which is negative except mentioned above in history of present illness.Marland Kitchen  MEDICAL HISTORY:  Past Medical History  Diagnosis Date  . Tachycardia   . Overweight(278.02)     Obesity  . Chest pain, unspecified   . Palpitations   . Osteoporosis   . Thrombocytopenia ([place])   . Lymphoma ([name]) 09/14/15    Monoclonal B cell lymphoma  . Diverticulosis 07/06/15  . Esophagitis 07/06/15  . Gastritis 07/06/15  . 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 GERD (gastroesophageal reflux disease)   . Fatigue   . Leg cramps   . Night sweats   . Cancer [facility] [place])     SURGICAL HISTORY: Past Surgical History  Procedure Laterality Date  . Cholecystectomy  1995  . Abdominal hysterectomy  2005  . Bone marrow biopsy  09/14/15  . Colonoscopy  07/05/2014  . Esophagogastroduodenoscopy  07/05/2014    SOCIAL HISTORY: Social History   Social History  . Marital Status: Married    Spouse Name: N/A  . Number of Children: N/A  .  Years of Education: N/A   Occupational History  . Not on file.   Social History Main Topics  . Smoking status: Never Smoker   . Smokeless tobacco: Never Used  . Alcohol Use: No  . Drug Use: No  . Sexual Activity: Not on file   Other Topics Concern  . Not on file   Social History Narrative   Married   Does not get regular exercise    FAMILY HISTORY: Family History  Problem Relation Age of Onset  . Diabetes Neg Hx   . Heart disease Father     CABG x 4  . Hypertension Mother   . Pancreatic cancer Mother   . Ulcers Mother   . Asthma Mother     ALLERGIES:  has No Known Allergies.  MEDICATIONS:  Current Outpatient Prescriptions  Medication Sig Dispense Refill  . Alum Hydroxide-Mag Carbonate (GAVISCON PO) Take by mouth.    . Calcium Carbonate-Vit D-Min (CALCIUM 1200 PO) Take by mouth 2 (two) times daily.    . Cholecalciferol ([employer]S VIT D 5000 HIGH-POTENCY PO) Take by mouth.    . Probiotic Product (ALIGN PO) Take by mouth.    . ranitidine (ZANTAC) 150 MG tablet Take by mouth.     No current facility-administered medications for this visit.      Marland Kitchen  PHYSICAL EXAMINATION:   Filed Vitals:   09/19/15 1518  BP: 142/83  Pulse: 109  Temp: 98.5 F (36.9 C)  Resp: 18   Filed Weights   09/19/15 1518  Weight: 166 lb 14.2 oz (75.7 kg)    GENERAL: Well-nourished well-developed; Alert, no distress and comfortable.  Accompanied by her husband.  EYES: no pallor or icterus OROPHARYNX: no thrush or ulceration; good dentition  NECK: supple, no masses felt LYMPH:  no palpable lymphadenopathy in the cervical, axillary; 1 to 2 cm lymph node felt in the right inguinal region. LUNGS: clear to auscultation and  No wheeze or crackles HEART/CVS: regular rate & rhythm and no murmurs; No lower extremity edema ABDOMEN: abdomen soft, non-tender and normal bowel sounds; positive for spleen.  Musculoskeletal:no cyanosis of digits and no clubbing  PSYCH: alert & oriented x 3 with fluent  speech NEURO: no focal motor/sensory deficits SKIN:  no rashes or significant lesions  LABORATORY DATA:  I have reviewed the data as listed Lab Results  Component Value Date   WBC 5.4 09/19/2015   HGB 12.7 09/19/2015   HCT 37.0 09/19/2015   MCV 85.0 09/19/2015   PLT 60* 09/19/2015    Recent Labs  08/14/15 1709 08/24/15 1141 09/05/15 1137  NA 134* 140 136  K 4.2 4.2 4.2  CL 99* 99 101  CO2 23 26 18*  GLUCOSE 81 85 83  BUN 15 17 17   CREATININE 0.94 0.90 0.89  CALCIUM 9.2 10.2 9.7  GFRNONAA >60  --  >60  GFRAA >60  --  >60  PROT  --  6.6 6.9  ALBUMIN  --  4.3 4.8  AST  --  42* 37  ALT  --  52* 36  ALKPHOS  --  111 98  BILITOT  --  0.6 0.7    RADIOGRAPHIC STUDIES: I have personally reviewed the radiological images as listed and agreed with the findings in the report. Ct Chest W Contrast  09/09/2015  CLINICAL DATA:  Thrombocytopenia. Splenomegaly. Fatigue. History of cholecystectomy and hysterectomy. EXAM: CT CHEST, ABDOMEN, AND PELVIS WITH CONTRAST TECHNIQUE: Multidetector CT imaging of the chest, abdomen and pelvis was performed following the standard protocol during bolus administration of intravenous contrast. CONTRAST:  132m ISOVUE-300 IOPAMIDOL (ISOVUE-300) INJECTION 61% COMPARISON:  03/15/2004 CT abdomen/ pelvis. 05/23/2005 MRI abdomen. No prior chest CT. FINDINGS: CT CHEST Mediastinum/Nodes: Normal heart size. No significant pericardial fluid/thickening. Great vessels are normal in course and caliber. No central pulmonary emboli. Normal visualized thyroid. Normal esophagus. There a few mildly enlarged right axillary nodes, largest 1.0 cm (series 2/ image 11). There are mildly enlarged bilateral subpectoral lymph nodes, largest 1.1 cm on the right (series 2/ image 9) and 1.0 cm on the left (series 2/ image 9). No pathologically enlarged mediastinal or hilar nodes. Lungs/Pleura: No pneumothorax. No pleural effusion. No acute consolidative airspace disease, significant  pulmonary nodules or lung masses. Mild osteophyte related fibrosis in the medial right lower lobe. Musculoskeletal: No aggressive appearing focal osseous lesions. Mild degenerative changes in the thoracic spine. CT ABDOMEN AND PELVIS Hepatobiliary: Normal liver with no liver mass. Cholecystectomy. No biliary ductal dilatation. Pancreas: Normal, with no mass or duct dilation. Spleen: Moderate to marked splenomegaly (craniocaudal splenic length 18.0 cm) with approximate splenic volume 1312 cc (15.4 x 9.1 x 18.0 cm). No splenic mass. Adrenals/Urinary Tract: Normal adrenals. Normal kidneys with no hydronephrosis and no renal mass. Normal bladder. Stomach/Bowel: Grossly normal stomach. Normal caliber small bowel with no small bowel wall thickening. Normal appendix. Mild sigmoid diverticulosis, with no large bowel wall thickening or pericolonic fat stranding. Oral contrast progresses to the distal rectum. Vascular/Lymphatic: Atherosclerotic nonaneurysmal abdominal aorta. Patent portal, splenic, hepatic and renal veins.  There are several mildly enlarged aortocaval and left para-aortic nodes, with the largest aortocaval node measuring 1.0 cm (series 2/image 60) in the largest left periaortic node measuring 1.0 cm (series 2/image 68). Mild bilateral common iliac lymphadenopathy, largest 1.3 cm on the right (series 2/image 82) and 1.2 cm on the left (series 2/image 76). Mild-to-moderate right external iliac lymphadenopathy, largest 1.7 cm (series 2/image 100). Mild right inguinal lymphadenopathy, largest 1.7 cm (series 2/image 113). Reproductive: Status post hysterectomy, with no abnormal findings at the vaginal cuff. No adnexal mass. Other: No pneumoperitoneum, ascites or focal fluid collection. Musculoskeletal: No aggressive appearing focal osseous lesions. Subcentimeter sclerotic lesions in the right iliac bone and lumbar spine are nonspecific and probably benign bone islands. Mild degenerative changes in the lumbar spine.  IMPRESSION: 1. Moderate to marked splenomegaly. Mild right axillary and mild bilateral subpectoral lymphadenopathy. Mild retroperitoneal and bilateral pelvic lymphadenopathy. These findings likely indicate a lymphoproliferative disorder. Tissue sampling is advised, with the most amenable targets likely the right inguinal, right external iliac or right axillary adenopathy. 2. Mild sigmoid diverticulosis. Electronically Signed   By: Ilona Sorrel M.D.   On: 09/09/2015 08:51   Ct Abdomen Pelvis W Contrast  09/09/2015  CLINICAL DATA:  Thrombocytopenia. Splenomegaly. Fatigue. History of cholecystectomy and hysterectomy. EXAM: CT CHEST, ABDOMEN, AND PELVIS WITH CONTRAST TECHNIQUE: Multidetector CT imaging of the chest, abdomen and pelvis was performed following the standard protocol during bolus administration of intravenous contrast. CONTRAST:  141m ISOVUE-300 IOPAMIDOL (ISOVUE-300) INJECTION 61% COMPARISON:  03/15/2004 CT abdomen/ pelvis. 05/23/2005 MRI abdomen. No prior chest CT. FINDINGS: CT CHEST Mediastinum/Nodes: Normal heart size. No significant pericardial fluid/thickening. Great vessels are normal in course and caliber. No central pulmonary emboli. Normal visualized thyroid. Normal esophagus. There a few mildly enlarged right axillary nodes, largest 1.0 cm (series 2/ image 11). There are mildly enlarged bilateral subpectoral lymph nodes, largest 1.1 cm on the right (series 2/ image 9) and 1.0 cm on the left (series 2/ image 9). No pathologically enlarged mediastinal or hilar nodes. Lungs/Pleura: No pneumothorax. No pleural effusion. No acute consolidative airspace disease, significant pulmonary nodules or lung masses. Mild osteophyte related fibrosis in the medial right lower lobe. Musculoskeletal: No aggressive appearing focal osseous lesions. Mild degenerative changes in the thoracic spine. CT ABDOMEN AND PELVIS Hepatobiliary: Normal liver with no liver mass. Cholecystectomy. No biliary ductal dilatation.  Pancreas: Normal, with no mass or duct dilation. Spleen: Moderate to marked splenomegaly (craniocaudal splenic length 18.0 cm) with approximate splenic volume 1312 cc (15.4 x 9.1 x 18.0 cm). No splenic mass. Adrenals/Urinary Tract: Normal adrenals. Normal kidneys with no hydronephrosis and no renal mass. Normal bladder. Stomach/Bowel: Grossly normal stomach. Normal caliber small bowel with no small bowel wall thickening. Normal appendix. Mild sigmoid diverticulosis, with no large bowel wall thickening or pericolonic fat stranding. Oral contrast progresses to the distal rectum. Vascular/Lymphatic: Atherosclerotic nonaneurysmal abdominal aorta. Patent portal, splenic, hepatic and renal veins. There are several mildly enlarged aortocaval and left para-aortic nodes, with the largest aortocaval node measuring 1.0 cm (series 2/image 60) in the largest left periaortic node measuring 1.0 cm (series 2/image 68). Mild bilateral common iliac lymphadenopathy, largest 1.3 cm on the right (series 2/image 82) and 1.2 cm on the left (series 2/image 76). Mild-to-moderate right external iliac lymphadenopathy, largest 1.7 cm (series 2/image 100). Mild right inguinal lymphadenopathy, largest 1.7 cm (series 2/image 113). Reproductive: Status post hysterectomy, with no abnormal findings at the vaginal cuff. No adnexal mass. Other: No pneumoperitoneum, ascites or focal  fluid collection. Musculoskeletal: No aggressive appearing focal osseous lesions. Subcentimeter sclerotic lesions in the right iliac bone and lumbar spine are nonspecific and probably benign bone islands. Mild degenerative changes in the lumbar spine. IMPRESSION: 1. Moderate to marked splenomegaly. Mild right axillary and mild bilateral subpectoral lymphadenopathy. Mild retroperitoneal and bilateral pelvic lymphadenopathy. These findings likely indicate a lymphoproliferative disorder. Tissue sampling is advised, with the most amenable targets likely the right inguinal,  right external iliac or right axillary adenopathy. 2. Mild sigmoid diverticulosis. Electronically Signed   By: Ilona Sorrel M.D.   On: 09/09/2015 08:51   Ct Biopsy  09/13/2015  CLINICAL DATA:  Thrombocytopenia. Bone marrow biopsy needed for further workup. EXAM: CT GUIDED ILIAC BONE MARROW BIOPSY ASPIRATE ANESTHESIA/SEDATION: Versed 2.0 mg IV, Fentanyl 75 mcg IV Total Moderate Sedation Time 10 minutes. The patient's level of consciousness and physiologic status were continuously monitored during the procedure by Radiology nursing. PROCEDURE: The procedure risks, benefits, and alternatives were explained to the patient. Questions regarding the procedure were encouraged and answered. The patient understands and consents to the procedure. A time-out was performed prior to the procedure. The right gluteal region was prepped with Betadine. Sterile gown and sterile gloves were used for the procedure. Local anesthesia was provided with 1% Lidocaine. Under CT guidance, an 11 gauge OnControl bone cutting needle was advanced from a posterior approach into the right iliac bone. Needle positioning was confirmed with CT. Initial non heparinized and heparinized aspirate samples were obtained of bone marrow. Core biopsy was performed via the OnControl needle. COMPLICATIONS: None FINDINGS: Inspection of initial aspirate did reveal visible particles. Intact core biopsy sample was obtained. IMPRESSION: CT guided bone marrow biopsy of right posterior iliac bone with both aspirate and core samples obtained. Electronically Signed   By: Aletta Edouard M.D.   On: 09/13/2015 12:39    ASSESSMENT & PLAN:   # Large B-cell lymphoma- intravascular type. Discussed with the patient and family the pathology/ the unusual nature of the lymphoma. CT scan shows 1-2 cm lymph nodes above and below the diaphragm; splenomegaly. Recommend PET scan. I reviewed the images myself and with the family. I also spoke to the pathologist Dr.Bassam;[place]  regarding the unusual pathology.  # I discussed in general the treatment of large B-cell lymphoma includes- R CHOP chemotherapy every 3 weeks 6 cycles. Discussed the potential side effects including but not limited to-increasing fatigue, nausea vomiting, diarrhea, hair loss, sores in the mouth, increase risk of infection and also neuropathy.   # Heart palpitations- awaiting workup with stress test and 2-D echo with cardiology. Discussed that this would be important to proceed with R CHOP chemotherapy.  # Given the unusual nature of the disease- stem cell transplant might also be part of the treatment plan. I would [facility]   # Mediport placement-platelets of 60; discuss with could transfuse Unit of platelets prior to port placement.  We'll check with vascular.  # Patient is traveling to [place] this week; she'll be back as we can. We will contact her husband [phone].     # 40 minutes face-to-face with the patient discussing the above plan of care; more than 50% of time spent on natural history; counseling and coordination.     Cammie Sickle, MD 09/19/2015 4:06 PM

## 2015-09-19 NOTE — Patient Instructions (Signed)
Non-Hodgkin Lymphoma, Adult Non-Hodgkin lymphoma (NHL) is a cancer of the lymphatic system. The lymphatic system is part of your body's defense (immune) system, which protects the body from infections, germs, and diseases. NHL affects a type of white blood cell called lymphocytes.  There are different types of NHL. The kind of NHL you have depends on the type of cells it affects and how quickly it grows and spreads.  CAUSES  The cause of NHL is not known.  RISK FACTORS Risks factors for NHL include:  Having a weak immune system, especially after an organ transplant.  Specific bacterial and viral infections including:  HIV.  Epstein-Barr virus.  Age. NHL is more common in people over the age of 47. SIGNS AND SYMPTOMS  Symptoms of NHL may include:   Swelling of the lymph nodes in your neck, armpits, or groin.  Fever.  Chills.  Sweating without cause.  Itchy skin.  Fatigue.  Unexplained weight loss.  Coughing, breathing trouble, and chest pain.  Unexplained weakness.  Swelling of your face, legs, or stomach (abdomen).  Abdominal pain.  Nausea and vomiting.  Loss of appetite.  Headache.  Difficulty concentrating.  Changes in personality.  Seizures. DIAGNOSIS  The diagnosis of NHL may include:   A physical exam and medical history.  Blood tests.  Chest X-ray.  Testing of body tissue (biopsy) of your lymph gland or bone marrow.  Different types of scans, including:  CT scans.  Positron emission tomography (PET) scan.  Gallium scan. TREATMENT  NHL can be treated in different ways. This depends on your symptoms, the stage of NHL when you were first diagnosed, and the speed with which it is spreading. Treatment may include:   Radiation therapy. This uses radiation to destroy cancer cells.  Chemotherapy. This uses medicine to destroy the cancer cells.  Biological therapy. This uses the body's immune system to destroy cancer cells.  Bone marrow  transplantation.  Blood or platelet transfusions. This may be needed if your blood counts are low. Your cancer will be staged to determine its severity and extent. Your health care provider does staging to find out whether the cancer has spread, and if so, to what parts of your body. You may need to have more tests to determine the stage of your cancer. HOME CARE INSTRUCTIONS  Take medicines only as directed by your health care provider.  Plan rest periods when fatigued.  Keep all follow-up visits as directed by your health care provider. This is important. SEEK MEDICAL CARE IF:   You have new symptoms of NHL.  You have signs of infection. These might include:  Fever.  Chills.  Sore throat.  Headache.  Nausea.  Vomiting.  Diarrhea. MAKE SURE YOU:  Understand these instructions.  Will watch your condition.   Will get help right away if you are not doing well or get worse.   This information is not intended to replace advice given to you by your health care provider. Make sure you discuss any questions you have with your health care provider.   Document Released: 11/11/2006 Document Revised: 05/21/2014 Document Reviewed: 08/05/2013 Elsevier Interactive Patient Education 2016 [employer] An implanted port is a type of central line that is placed under the skin. Central lines are used to provide IV access when treatment or nutrition needs to be given through a person's veins. Implanted ports are used for long-term IV access. An implanted port may be  placed because:   You need IV medicine that would be irritating to the small veins in your hands or arms.   You need long-term IV medicines, such as antibiotics.   You need IV nutrition for a long period.   You need frequent blood draws for lab tests.   You need dialysis.  Implanted ports are usually placed in the chest area, but they can also be placed in the upper arm, the  abdomen, or the leg. An implanted port has two main parts:   Reservoir. The reservoir is round and will appear as a small, raised area under your skin. The reservoir is the part where a needle is inserted to give medicines or draw blood.   Catheter. The catheter is a thin, flexible tube that extends from the reservoir. The catheter is placed into a large vein. Medicine that is inserted into the reservoir goes into the catheter and then into the vein.  HOW WILL I CARE FOR MY INCISION SITE? Do not get the incision site wet. Bathe or shower as directed by your health care provider.  HOW IS MY PORT ACCESSED? Special steps must be taken to access the port:   Before the port is accessed, a numbing cream can be placed on the skin. This helps numb the skin over the port site.   Your health care provider uses a sterile technique to access the port.  Your health care provider must put on a mask and sterile gloves.  The skin over your port is cleaned carefully with an antiseptic and allowed to dry.  The port is gently pinched between sterile gloves, and a needle is inserted into the port.  Only "non-coring" port needles should be used to access the port. Once the port is accessed, a blood return should be checked. This helps ensure that the port is in the vein and is not clogged.   If your port needs to remain accessed for a constant infusion, a clear (transparent) bandage will be placed over the needle site. The bandage and needle will need to be changed every week, or as directed by your health care provider.   Keep the bandage covering the needle clean and dry. Do not get it wet. Follow your health care provider's instructions on how to take a shower or bath while the port is accessed.   If your port does not need to stay accessed, no bandage is needed over the port.  WHAT IS FLUSHING? Flushing helps keep the port from getting clogged. Follow your health care provider's instructions on how  and when to flush the port. Ports are usually flushed with saline solution or a medicine called heparin. The need for flushing will depend on how the port is used.   If the port is used for intermittent medicines or blood draws, the port will need to be flushed:   After medicines have been given.   After blood has been drawn.   As part of routine maintenance.   If a constant infusion is running, the port may not need to be flushed.  HOW LONG WILL MY PORT STAY IMPLANTED? The port can stay in for as long as your health care provider thinks it is needed. When it is time for the port to come out, surgery will be done to remove it. The procedure is similar to the one performed when the port was put in.  WHEN SHOULD I SEEK IMMEDIATE MEDICAL CARE? When you have an implanted port, you  should seek immediate medical care if:   You notice a bad smell coming from the incision site.   You have swelling, redness, or drainage at the incision site.   You have more swelling or pain at the port site or the surrounding area.   You have a fever that is not controlled with medicine.   This information is not intended to replace advice given to you by your health care provider. Make sure you discuss any questions you have with your health care provider.   Document Released: 04/30/2005 Document Revised: 02/18/2013 Document Reviewed: 01/05/2013 Elsevier Interactive Patient Education [employer].

## 2015-09-20 ENCOUNTER — Telehealth: Payer: Self-pay | Admitting: Internal Medicine

## 2015-09-20 ENCOUNTER — Telehealth: Payer: Self-pay | Admitting: *Deleted

## 2015-09-20 ENCOUNTER — Other Ambulatory Visit: Payer: Self-pay | Admitting: *Deleted

## 2015-09-20 ENCOUNTER — Ambulatory Visit
Admission: RE | Admit: 2015-09-20 | Discharge: 2015-09-20 | Disposition: A | Discharge status: Home / Self Care | Payer: BLUE CROSS/BLUE SHIELD | Source: Ambulatory Visit

## 2015-09-20 ENCOUNTER — Other Ambulatory Visit: Payer: Self-pay | Admitting: Internal Medicine

## 2015-09-20 DIAGNOSIS — C8330 Diffuse large B-cell lymphoma, unspecified site: Secondary | ICD-10-CM

## 2015-09-20 DIAGNOSIS — Z1231 Encounter for screening mammogram for malignant neoplasm of breast: Secondary | ICD-10-CM

## 2015-09-20 DIAGNOSIS — C8333 Diffuse large B-cell lymphoma, intra-abdominal lymph nodes: Secondary | ICD-10-CM

## 2015-09-20 NOTE — Telephone Encounter (Signed)
Call returned. RN spoke with patient and pt's husband. [facility] is in their health insurance network.  I updated pt's husband on the planned date for the pet scan.  Teach back process performed re: pet scan diet preparation.

## 2015-09-20 NOTE — Telephone Encounter (Signed)
Asking for Nira Conn to return his call, he has insurance information Dr B wanted [id number]

## 2015-09-20 NOTE — Telephone Encounter (Signed)
Spoke to patient regarding my communication with Dr. At [facility]. Recommend MRI brain; LP. We'll try to set this up- hopefully around the time of the port placement/with a platelet transfusion prior.   Also ordered MRI of brain stat- Nira Conn this also needs to be done- next week when she is back. I think Dr.Dittus office is going to call pt re: appt. Thx

## 2015-09-21 ENCOUNTER — Other Ambulatory Visit: Payer: Self-pay | Admitting: Internal Medicine

## 2015-09-21 ENCOUNTER — Ambulatory Visit: Payer: BLUE CROSS/BLUE SHIELD | Admitting: Internal Medicine

## 2015-09-21 DIAGNOSIS — C8583 Other specified types of non-Hodgkin lymphoma, intra-abdominal lymph nodes: Secondary | ICD-10-CM | POA: Insufficient documentation

## 2015-09-21 NOTE — Telephone Encounter (Signed)
RN received secure msg from Mayra Neer at [facility]. Patient is scheduled to see Dr. Lonia Blood on 5/19 at 12:30pm,. Lennette Bihari has confirmed apts with pt's husband. He is able to view records in care everywhere. He will request other materials as needed.  Dr. Rogue Bussing informed of plan. Will move patient's md apt to the following Monday as pt's next apt with Dr. Rogue Bussing is also on 09/30/15.  Msg sent to scheduling regarding this information as well as scheduling MRI and Lumbar puncture.

## 2015-09-22 ENCOUNTER — Encounter: Payer: Self-pay | Admitting: *Deleted

## 2015-09-22 ENCOUNTER — Telehealth: Payer: Self-pay | Admitting: *Deleted

## 2015-09-22 ENCOUNTER — Other Ambulatory Visit: Payer: Self-pay | Admitting: Vascular Surgery

## 2015-09-22 ENCOUNTER — Other Ambulatory Visit: Payer: Self-pay | Admitting: Internal Medicine

## 2015-09-22 DIAGNOSIS — C8583 Other specified types of non-Hodgkin lymphoma, intra-abdominal lymph nodes: Secondary | ICD-10-CM

## 2015-09-22 DIAGNOSIS — D696 Thrombocytopenia, unspecified: Secondary | ICD-10-CM

## 2015-09-22 NOTE — Telephone Encounter (Signed)
Treatment plan apts.  On Monday 09/26/15, Pet scan at 830am / MRI of brain at 12 noon.  Lab only on Monday 09/26/15 after pet scan for group/RH. RNs Will need to release 1 unit of irradiated plts in preparation for transfusion on Wed. 09/28/15.    Port a cath to be placed on Tuesday by Dr. Lucky Cowboy. Pt will need to arrive in medical mall at 1145 am for a [id number] port placement.  Pt must be NPO after midnight and bring a driver.  [facility] has already left a vm for patient re: this appointment.  RN spoke with Juanita in Special Procedures. Orders placed by Dr. Rogue Bussing for Lumbar Puncture and nursing form for LP was faxed to specialty scheduling.  Per Curly [place], LP will be performed/scheduled on Wednesday 5/17 (tentative date. Arrival Time to be Determined.).  Charge nurse approved 1 unit of plts to be transfused in Special Procedures before LP procedure. MD wrote orders for irradiated plts. These plt prepare orders will be released by RN on Monday when patient comes for lab apts.   Pt has an apt for holter monitor/echo on Wednesday morning 09/30/15 and a Nuc Med apt on Tuesday-5/18.  I reached out to Dr. Orion Modest office to see if this apt could be r/s if needed. The Nuc med apt on Tuesday 5/18 at 730am will remain. The patient should register for both the [facility] apt and port placement with Dr. Lucky Cowboy on arrival to medical mall on 09/29/15.  The holter monitor will be placed on Thursday 09/29/15 at 8am. The monitor will need to be worn for 24 hrs. The patient will be able to take off her own leads per vascular after wearing the monitor for 24hrs. The nurses will review the instructions with the patient upon placement.  The Echo will be performed on 10/05/15 at 1030 am.  The apt with Dr. Loletha Grayer will be on 10/11/15 at 2:00pm.  Dr.Brahmanday will personally talk to Dr. Farrel Conners regarding patient care.  She will see the [facility] provider with Dr. Lonia Blood on Friday 5/19 at 12:30pm.  Dr. Rogue Bussing will see the patient on  10/03/15 with repeat lab (cbc) check at 11am for labs and 115 with md apt.  Pt will be contacted regarding these upcoming appointments.  Pt is currently in [place] for her daughter's master deg. graduation.  I will continue to try reaching patient/patient's husband with the above plan.

## 2015-09-22 NOTE — Telephone Encounter (Signed)
RN spoke with husband. Pt/husband is currently traveling. Husband briefly wrote information down for apts. He asked that we send a pt email through [employer]. He will check the mychart when they get to the hotel.  He thanked me for calling him with the detailed apts.

## 2015-09-23 LAB — CHROMOSOME ANALYSIS, BONE MARROW

## 2015-09-26 ENCOUNTER — Ambulatory Visit
Admission: RE | Admit: 2015-09-26 | Discharge: 2015-09-26 | Disposition: A | Discharge status: Home / Self Care | Payer: BLUE CROSS/BLUE SHIELD | Source: Ambulatory Visit | Attending: Internal Medicine | Admitting: Internal Medicine

## 2015-09-26 ENCOUNTER — Other Ambulatory Visit: Payer: Self-pay | Admitting: Internal Medicine

## 2015-09-26 ENCOUNTER — Encounter
Admission: RE | Admit: 2015-09-26 | Discharge: 2015-09-26 | Disposition: A | Discharge status: Home / Self Care | Payer: BLUE CROSS/BLUE SHIELD | Source: Ambulatory Visit | Attending: Internal Medicine | Admitting: Internal Medicine

## 2015-09-26 ENCOUNTER — Inpatient Hospital Stay: Payer: BLUE CROSS/BLUE SHIELD

## 2015-09-26 DIAGNOSIS — R590 Localized enlarged lymph nodes: Secondary | ICD-10-CM | POA: Insufficient documentation

## 2015-09-26 DIAGNOSIS — D696 Thrombocytopenia, unspecified: Secondary | ICD-10-CM

## 2015-09-26 DIAGNOSIS — C8330 Diffuse large B-cell lymphoma, unspecified site: Secondary | ICD-10-CM

## 2015-09-26 DIAGNOSIS — C8521 Mediastinal (thymic) large B-cell lymphoma, lymph nodes of head, face, and neck: Secondary | ICD-10-CM | POA: Diagnosis not present

## 2015-09-26 DIAGNOSIS — R161 Splenomegaly, not elsewhere classified: Secondary | ICD-10-CM | POA: Diagnosis not present

## 2015-09-26 DIAGNOSIS — C8333 Diffuse large B-cell lymphoma, intra-abdominal lymph nodes: Secondary | ICD-10-CM

## 2015-09-26 DIAGNOSIS — F419 Anxiety disorder, unspecified: Secondary | ICD-10-CM

## 2015-09-26 DIAGNOSIS — C8583 Other specified types of non-Hodgkin lymphoma, intra-abdominal lymph nodes: Secondary | ICD-10-CM

## 2015-09-26 LAB — CBC WITH DIFFERENTIAL/PLATELET
BASOS ABS: 0 10*3/uL (ref 0–0.1)
Basophils Relative: 1 %
EOS PCT: 1 %
Eosinophils Absolute: 0 10*3/uL (ref 0–0.7)
HEMATOCRIT: 38 % (ref 35.0–47.0)
HEMOGLOBIN: 12.5 g/dL (ref 12.0–16.0)
LYMPHS ABS: 0.6 10*3/uL — AB (ref 1.0–3.6)
LYMPHS PCT: 18 %
MCH: 28.2 pg (ref 26.0–34.0)
MCHC: 33.1 g/dL (ref 32.0–36.0)
MCV: 85.3 fL (ref 80.0–100.0)
MONOS PCT: 38 %
Monocytes Absolute: 1.2 10*3/uL — ABNORMAL HIGH (ref 0.2–0.9)
NEUTROS ABS: 1.3 10*3/uL — AB (ref 1.4–6.5)
Neutrophils Relative %: 42 %
Platelets: 61 10*3/uL — ABNORMAL LOW (ref 150–440)
RBC: 4.45 MIL/uL (ref 3.80–5.20)
RDW: 16.8 % — ABNORMAL HIGH (ref 11.5–14.5)
WBC: 3.1 10*3/uL — AB (ref 3.6–11.0)

## 2015-09-26 LAB — ABO/RH: ABO/RH(D): A NEG

## 2015-09-26 LAB — GLUCOSE, CAPILLARY: GLUCOSE-CAPILLARY: 77 mg/dL (ref 65–99)

## 2015-09-26 MED ORDER — FLUDEOXYGLUCOSE F - 18 (FDG) INJECTION
11.5000 | Freq: Once | INTRAVENOUS | Status: AC | PRN
  Administered 2015-09-26: 11.5 via INTRAVENOUS

## 2015-09-26 MED ORDER — TECHNETIUM TC 99M SESTAMIBI - CARDIOLITE: 11.5000 | Freq: Once | INTRAVENOUS | Status: DC | PRN

## 2015-09-26 MED ORDER — GADOBENATE DIMEGLUMINE 529 MG/ML IV SOLN
15.0000 mL | Freq: Once | INTRAVENOUS | Status: AC | PRN
  Administered 2015-09-26: 15 mL via INTRAVENOUS

## 2015-09-26 MED ORDER — DIAZEPAM 5 MG PO TABS: ORAL_TABLET | ORAL | Status: DC

## 2015-09-27 ENCOUNTER — Other Ambulatory Visit: Payer: Self-pay | Admitting: Internal Medicine

## 2015-09-27 ENCOUNTER — Telehealth: Payer: Self-pay | Admitting: Internal Medicine

## 2015-09-27 ENCOUNTER — Encounter: Payer: Self-pay | Admitting: *Deleted

## 2015-09-27 ENCOUNTER — Ambulatory Visit
Admission: RE | Admit: 2015-09-27 | Discharge: 2015-09-27 | Disposition: A | Discharge status: Home / Self Care | Payer: BLUE CROSS/BLUE SHIELD | Source: Ambulatory Visit | Attending: Vascular Surgery | Admitting: Vascular Surgery

## 2015-09-27 ENCOUNTER — Encounter
Admission: RE | Disposition: A | Discharge status: Home / Self Care | Payer: Self-pay | Source: Ambulatory Visit | Attending: Vascular Surgery

## 2015-09-27 ENCOUNTER — Encounter: Admission: RE | Admit: 2015-09-27 | Payer: BLUE CROSS/BLUE SHIELD | Source: Ambulatory Visit

## 2015-09-27 DIAGNOSIS — E669 Obesity, unspecified: Secondary | ICD-10-CM | POA: Diagnosis not present

## 2015-09-27 DIAGNOSIS — Z9049 Acquired absence of other specified parts of digestive tract: Secondary | ICD-10-CM | POA: Diagnosis not present

## 2015-09-27 DIAGNOSIS — Z90710 Acquired absence of both cervix and uterus: Secondary | ICD-10-CM | POA: Insufficient documentation

## 2015-09-27 DIAGNOSIS — M810 Age-related osteoporosis without current pathological fracture: Secondary | ICD-10-CM | POA: Insufficient documentation

## 2015-09-27 DIAGNOSIS — Z79899 Other long term (current) drug therapy: Secondary | ICD-10-CM | POA: Insufficient documentation

## 2015-09-27 DIAGNOSIS — K219 Gastro-esophageal reflux disease without esophagitis: Secondary | ICD-10-CM | POA: Insufficient documentation

## 2015-09-27 DIAGNOSIS — C8590 Non-Hodgkin lymphoma, unspecified, unspecified site: Secondary | ICD-10-CM | POA: Diagnosis not present

## 2015-09-27 DIAGNOSIS — C8583 Other specified types of non-Hodgkin lymphoma, intra-abdominal lymph nodes: Secondary | ICD-10-CM

## 2015-09-27 HISTORY — PX: PERIPHERAL VASCULAR CATHETERIZATION: SHX172C

## 2015-09-27 SURGERY — PORTA CATH INSERTION
Anesthesia: Moderate Sedation

## 2015-09-27 MED ORDER — MIDAZOLAM HCL 2 MG/2ML IJ SOLN
INTRAMUSCULAR | Status: DC | PRN
  Administered 2015-09-27: 1 mg via INTRAVENOUS
  Administered 2015-09-27: 2 mg via INTRAVENOUS

## 2015-09-27 MED ORDER — LIDOCAINE-EPINEPHRINE (PF) 1 %-1:200000 IJ SOLN
INTRAMUSCULAR | Status: DC | PRN
  Administered 2015-09-27: 20 mL via INTRADERMAL

## 2015-09-27 MED ORDER — ONDANSETRON HCL 4 MG/2ML IJ SOLN: 4.0000 mg | Freq: Four times a day (QID) | INTRAMUSCULAR | Status: DC | PRN

## 2015-09-27 MED ORDER — MIDAZOLAM HCL 5 MG/5ML IJ SOLN
INTRAMUSCULAR | Status: AC
  Filled 2015-09-27: qty 5

## 2015-09-27 MED ORDER — DEXTROSE 5 % IV SOLN
1.5000 g | INTRAVENOUS | Status: AC
  Administered 2015-09-27: 1.5 g via INTRAVENOUS

## 2015-09-27 MED ORDER — SODIUM CHLORIDE 0.9 % IR SOLN
Freq: Once | Status: DC
  Filled 2015-09-27: qty 2

## 2015-09-27 MED ORDER — SODIUM CHLORIDE 0.9 % IV SOLN
INTRAVENOUS | Status: DC
  Administered 2015-09-27: 12:00:00 via INTRAVENOUS

## 2015-09-27 MED ORDER — FENTANYL CITRATE (PF) 100 MCG/2ML IJ SOLN
INTRAMUSCULAR | Status: DC | PRN
  Administered 2015-09-27 (×2): 50 ug via INTRAVENOUS

## 2015-09-27 MED ORDER — HYDROMORPHONE HCL 1 MG/ML IJ SOLN: 1.0000 mg | Freq: Once | INTRAMUSCULAR | Status: DC

## 2015-09-27 MED ORDER — HEPARIN SODIUM (PORCINE) 1000 UNIT/ML IJ SOLN
INTRAMUSCULAR | Status: AC
  Filled 2015-09-27: qty 1

## 2015-09-27 MED ORDER — FENTANYL CITRATE (PF) 100 MCG/2ML IJ SOLN
INTRAMUSCULAR | Status: AC
  Filled 2015-09-27: qty 2

## 2015-09-27 SURGICAL SUPPLY — 10 items
BAG DECANTER STRL (MISCELLANEOUS) ×2 IMPLANT
KIT PORT POWER 8FR ISP CVUE (Catheter) ×2 IMPLANT
PACK ANGIOGRAPHY (CUSTOM PROCEDURE TRAY) ×2 IMPLANT
PAD GROUND ADULT SPLIT (MISCELLANEOUS) ×2 IMPLANT
PENCIL ELECTRO HAND CTR (MISCELLANEOUS) ×2 IMPLANT
PREP CHG 10.5 TEAL (MISCELLANEOUS) ×2 IMPLANT
SUT MNCRL AB 4-0 PS2 18 (SUTURE) ×2 IMPLANT
SUT PROLENE 0 CT 1 30 (SUTURE) ×2 IMPLANT
SUTURE VIC 3-0 (SUTURE) ×2 IMPLANT
TOWEL OR 17X26 4PK STRL BLUE (TOWEL DISPOSABLE) ×2 IMPLANT

## 2015-09-27 NOTE — H&P (Signed)
  [facility] History & Physical Update  The patient was interviewed and re-examined.  The patient's previous History and Physical has been reviewed and is unchanged.  There is no change in the plan of care. We plan to proceed with the scheduled procedure.  DEW,JASON, MD  09/27/2015, 11:28 AM

## 2015-09-27 NOTE — Discharge Instructions (Signed)
Implanted Port Insertion, Care After °Refer to this sheet in the next few weeks. These instructions provide you with information on caring for yourself after your procedure. Your health care provider may also give you more specific instructions. Your treatment has been planned according to current medical practices, but problems sometimes occur. Call your health care provider if you have any problems or questions after your procedure. °WHAT TO EXPECT AFTER THE PROCEDURE °After your procedure, it is typical to have the following:  °· Discomfort at the port insertion site. Ice packs to the area will help. °· Bruising on the skin over the port. This will subside in 3-4 days. °HOME CARE INSTRUCTIONS °· After your port is placed, you will get a manufacturer's information card. The card has information about your port. Keep this card with you at all times.   °· Know what kind of port you have. There are many types of ports available.   °· Wear a medical alert bracelet in case of an emergency. This can help alert health care workers that you have a port.   °· The port can stay in for as long as your health care provider believes it is necessary.   °· A home health care nurse may give medicines and take care of the port.   °· You or a family member can get special training and directions for giving medicine and taking care of the port at home.   °SEEK MEDICAL CARE IF:  °· Your port does not flush or you are unable to get a blood return.   °· You have a fever or chills. °SEEK IMMEDIATE MEDICAL CARE IF: °· You have new fluid or pus coming from your incision.   °· You notice a bad smell coming from your incision site.   °· You have swelling, pain, or more redness at the incision or port site.   °· You have chest pain or shortness of breath. °  °This information is not intended to replace advice given to you by your health care provider. Make sure you discuss any questions you have with your health care provider. °  °Document  Released: 02/18/2013 Document Revised: 05/05/2013 Document Reviewed: 02/18/2013 °Elsevier Interactive Patient Education ©2016 Elsevier Inc. ° °

## 2015-09-27 NOTE — Telephone Encounter (Signed)
Message sent to scheduling to arrange appt with [facility]. Scheduling will notify pt's husband with appt details.

## 2015-09-27 NOTE — Telephone Encounter (Signed)
I spoke to patient- reviewed the results of the MRI PET scan; recommend biopsy of the inguinal lymph node. Patient agreed-; I also spoke to Dr. Jamal Collin- kindly agreed to evaluate the patient.   Hayley- please inform Dr. Angie Fava office that patient has been informed of the plan. They need to reach her on husband's phone/Joe- [phone]. Thx

## 2015-09-27 NOTE — Op Note (Signed)
      [facility]       Operative Note  Date: 09/27/2015  Preoperative diagnosis:  1. lymphoma  Postoperative diagnosis:  Same as above  Procedures: #1. Ultrasound guidance for vascular access to the right internal jugular vein. #2. Fluoroscopic guidance for placement of catheter. #3. Placement of CT compatible Port-A-Cath, right internal jugular vein.  Surgeon: Leotis Pain, MD.   Anesthesia: Local with moderate conscious sedation for approximately 20  minutes using 3 mg of Versed and 100 mcg of Fentanyl  Fluoroscopy time: less than 1 minute  Contrast used: 0  Estimated blood loss: 20 cc  Indication for the procedure:  The patient is a 60 y.o.female with lymphoma.  The patient needs a Port-A-Cath for durable venous access, chemotherapy, lab draws, and CT scans. We are asked to place this. Risks and benefits were discussed and informed consent was obtained.  Description of procedure: The patient was brought to the vascular and interventional radiology suite.  Moderate conscious sedation was administered throughout the procedure during a face to face encounter with the patient with my supervision of the RN administering medicines and monitoring the patient's vital signs, pulse oximetry, telemetry and mental status throughout from the start of the procedure until the patient was taken to the recovery room. The right neck chest and shoulder were sterilely prepped and draped, and a sterile surgical field was created. Ultrasound was used to help visualize a patent right internal jugular vein. This was then accessed under direct ultrasound guidance without difficulty with the Seldinger needle and a permanent image was recorded. A J-wire was placed. After skin nick and dilatation, the peel-away sheath was then placed over the wire. I then anesthetized an area under the clavicle approximately 1-2 fingerbreadths. A transverse incision was created and an inferior pocket was created  with electrocautery and blunt dissection. The port was then brought onto the field, placed into the pocket and secured to the chest wall with 2 Prolene sutures. The catheter was connected to the port and tunneled from the subclavicular incision to the access site. Fluoroscopic guidance was then used to cut the catheter to an appropriate length. The catheter was then placed through the peel-away sheath and the peel-away sheath was removed. The catheter tip was parked in excellent location under fluorocoscopic guidance in the cavoatrial junction. The pocket was then irrigated with antibiotic impregnated saline and the wound was closed with a running 3-0 Vicryl and a 4-0 Monocryl. The access incision was closed with a single 4-0 Monocryl. The Huber needle was used to withdraw blood and flush the port with heparinized saline. Dermabond was then placed as a dressing. The patient tolerated the procedure well and was taken to the recovery room in stable condition.   DEW,JASON 09/27/2015 12:53 PM

## 2015-09-28 ENCOUNTER — Encounter: Payer: Self-pay | Admitting: General Surgery

## 2015-09-28 ENCOUNTER — Ambulatory Visit
Admission: RE | Admit: 2015-09-28 | Discharge: 2015-09-28 | Disposition: A | Discharge status: Home / Self Care | Payer: BLUE CROSS/BLUE SHIELD | Source: Ambulatory Visit | Attending: Internal Medicine | Admitting: Internal Medicine

## 2015-09-28 ENCOUNTER — Other Ambulatory Visit: Payer: BLUE CROSS/BLUE SHIELD

## 2015-09-28 ENCOUNTER — Telehealth: Payer: Self-pay

## 2015-09-28 ENCOUNTER — Ambulatory Visit (INDEPENDENT_AMBULATORY_CARE_PROVIDER_SITE_OTHER): Payer: BLUE CROSS/BLUE SHIELD | Admitting: General Surgery

## 2015-09-28 VITALS — BP 130/70 | HR 74 | Resp 12 | Ht 62.0 in | Wt 166.0 lb

## 2015-09-28 DIAGNOSIS — C8590 Non-Hodgkin lymphoma, unspecified, unspecified site: Secondary | ICD-10-CM

## 2015-09-28 DIAGNOSIS — C8583 Other specified types of non-Hodgkin lymphoma, intra-abdominal lymph nodes: Secondary | ICD-10-CM

## 2015-09-28 DIAGNOSIS — D696 Thrombocytopenia, unspecified: Secondary | ICD-10-CM | POA: Insufficient documentation

## 2015-09-28 DIAGNOSIS — C8521 Mediastinal (thymic) large B-cell lymphoma, lymph nodes of head, face, and neck: Secondary | ICD-10-CM | POA: Diagnosis not present

## 2015-09-28 LAB — PLATELET COUNT: PLATELETS: 61 10*3/uL — AB (ref 150–440)

## 2015-09-28 LAB — PATHOLOGIST SMEAR REVIEW

## 2015-09-28 MED ORDER — HEPARIN SOD (PORK) LOCK FLUSH 100 UNIT/ML IV SOLN
250.0000 [IU] | INTRAVENOUS | Status: DC | PRN
  Filled 2015-09-28: qty 5

## 2015-09-28 MED ORDER — HEPARIN SOD (PORK) LOCK FLUSH 100 UNIT/ML IV SOLN
500.0000 [IU] | Freq: Every day | INTRAVENOUS | Status: DC | PRN
  Filled 2015-09-28: qty 5

## 2015-09-28 MED ORDER — SODIUM CHLORIDE 0.9% FLUSH: 3.0000 mL | INTRAVENOUS | Status: DC | PRN

## 2015-09-28 MED ORDER — ACETAMINOPHEN 325 MG PO TABS
650.0000 mg | ORAL_TABLET | Freq: Once | ORAL | Status: DC
  Filled 2015-09-28: qty 2

## 2015-09-28 MED ORDER — SODIUM CHLORIDE 0.9 % IV SOLN
250.0000 mL | Freq: Once | INTRAVENOUS | Status: AC
  Administered 2015-09-28: 50 mL via INTRAVENOUS

## 2015-09-28 MED ORDER — DIPHENHYDRAMINE HCL 25 MG PO CAPS
25.0000 mg | ORAL_CAPSULE | Freq: Once | ORAL | Status: AC
  Administered 2015-09-28: 25 mg via ORAL
  Filled 2015-09-28: qty 1

## 2015-09-28 MED ORDER — SODIUM CHLORIDE 0.9% FLUSH: 10.0000 mL | INTRAVENOUS | Status: DC | PRN

## 2015-09-28 NOTE — Procedures (Signed)
LP at L2/3 No comp/EBL

## 2015-09-28 NOTE — Telephone Encounter (Signed)
LMOM for the patient to contact our office regarding the holter monitor that is to be placed on Monday, 05/04/2016.

## 2015-09-28 NOTE — Patient Instructions (Signed)
Patient's surgery has been scheduled for 10-03-15 at [facility].

## 2015-09-28 NOTE — Telephone Encounter (Signed)
Not on 05/04/2016 but 10/03/2015.

## 2015-09-28 NOTE — Discharge Instructions (Signed)
Lumbar Puncture °A lumbar puncture, or spinal tap, is a procedure in which a small amount of the fluid that surrounds the brain and spinal cord is removed and examined. The fluid is called the cerebrospinal fluid. This procedure may be done to:  °· Help diagnose various problems, such as meningitis, encephalitis, multiple sclerosis, and AIDS.   °· Remove fluid and relieve pressure that occurs with certain types of headaches.   °· Look for bleeding within the brain and spinal cord areas (central nervous system).   °· Place medicine into the spinal fluid.   °LET YOUR HEALTH CARE PROVIDER KNOW ABOUT: °· Any allergies you have. °· All medicines you are taking, including vitamins, herbs, eye drops, creams, and over-the-counter medicines. °· Previous problems you or members of your family have had with the use of anesthetics. °· Any blood disorders you have. °· Previous surgeries you have had. °· Medical conditions you have. °RISKS AND COMPLICATIONS °Generally, this is a safe procedure. However, as with any procedure, complications can occur. Possible complications include:  °· Spinal headache. This is a severe headache that occurs when there is a leak of spinal fluid. A spinal headache causes discomfort but is not dangerous. If it persists, another procedure may be done to treat the headache. °· Bleeding. This most often occurs in people with bleeding disorders. These are disorders in which the blood does not clot normally.   °· Infection at the insertion site that can spread to the bone or spinal fluid.  °· Formation of a spinal cord tumor (rare). °· Brain herniation or movement of the brain into the spinal cord (rare). °· Inability to move (extremely rare). °BEFORE THE PROCEDURE °· You may have blood tests done. These tests can help tell how well your kidneys and liver are working. They can also show how well your blood clots.   °· If you take blood thinners (anticoagulant medicine), ask your health care provider if  and when you should stop taking them.   °· Your health care provider may order a CT scan of your brain. °· Make arrangements for someone to drive you home after the procedure.     °PROCEDURE °· You will be positioned so that the spaces between the bones of the spine (vertebrae) are as wide as possible. This will make it easier to pass the needle into the spinal canal.  °· Depending on your age and size, you may lie on your side, curled up with your knees under your chin. Or, you may sit with your head resting on a pillow that is placed at waist level. °· The skin covering the lower back (or lumbar region) will be cleaned.   °· The skin may be numbed with medicine. °· You may be given pain medicine or a medicine to help you relax (sedative).   °· A small needle will be inserted in the skin until it enters the space that contains the spinal fluid. The needle will not enter the spinal cord.   °· The spinal fluid will be collected into tubes.   °· The needle will be withdrawn, and a bandage will be placed on the site.   °AFTER THE PROCEDURE °· You will remain lying down for 1 hour or for as long as your health care provider suggests.   °· The spinal fluid will be sent to a laboratory to be examined. The results of the examination may be available before you go home. °· A test, called a culture, may be taken of the spinal fluid if your health care provider thinks you have an infection. If cultures   were taken for exam, the results will usually be available in a couple of days. °  °  °This information is not intended to replace advice given to you by your health care provider. Make sure you discuss any questions you have with your health care provider. °  °Document Released: 04/27/2000 Document Revised: 02/18/2013 Document Reviewed: 01/05/2013 °Elsevier Interactive Patient Education ©2016 Elsevier Inc. ° °

## 2015-09-28 NOTE — OR Nursing (Signed)
Dr Rogue Bussing called, pt took tylenol at home this( do not repeat this), give benadryl to prevent possible reaction. Infuse platelets. Check platelets post infusion.

## 2015-09-28 NOTE — OR Nursing (Signed)
Dr Martinique informed earlier of coag level drawn 5/2, do not repeat. Platelet infusion completed, blood drawn from port for platelet count sent to lab. Port a cath [employer].

## 2015-09-28 NOTE — OR Nursing (Signed)
Dr Martinique notified of platelet count. Proceed with LP

## 2015-09-28 NOTE — Progress Notes (Signed)
Patient ID: Stacy Murillo, female   DOB: [birth year], 60 y.o.   MRN: [id number]  Chief Complaint  Patient presents with  . Other    inguinal lymphnode     HPI Stacy Murillo is a 60 y.o. female here today for a evaluation of a inguinal lymph node. I have reviewed the history of present illness with the patient.   This pt recently was found to be thrombocytopenic. Work up led to finding of splenomegaly, generalised lymphadenopathy. Bone marrow showed a very rae type of lymphoma. There is concern as to the actual type and a soft tissue biopsy is being requested. Most easily accessible is the right inguinal area, PET pos nodes   HPI  Past Medical History  Diagnosis Date  . Tachycardia   . Overweight(278.02)     Obesity  . Chest pain, unspecified   . Palpitations   . Osteoporosis   . Thrombocytopenia ([place])   . 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 Diverticulosis 07/06/15  . Esophagitis 07/06/15  . Gastritis 07/06/15  . 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 GERD (gastroesophageal reflux disease)   . Fatigue   . Leg cramps   . Night sweats   . Cancer [facility])     Past Surgical History  Procedure Laterality Date  . Abdominal hysterectomy  2005  . Bone marrow biopsy  09/14/15  . Colonoscopy  07/05/2014  . Esophagogastroduodenoscopy  07/05/2014  . Portacath placement Right   . Peripheral vascular catheterization N/A 09/27/2015    Procedure: Glori Luis Cath Insertion;  Surgeon: Algernon Huxley, MD;  Location: [facility];  Service: Cardiovascular;  Laterality: N/A;  . Cholecystectomy  1997    Family History  Problem Relation Age of Onset  . Diabetes Neg Hx   . Heart disease Father     CABG x 4  . Hypertension Mother   . Pancreatic cancer Mother   . Ulcers Mother   . Asthma Mother     Social History Social History  Substance Use Topics  . Smoking status: Never Smoker   . Smokeless tobacco: Never Used  . Alcohol Use: No    No Known Allergies  Current  Outpatient Prescriptions  Medication Sig Dispense Refill  . Alum Hydroxide-Mag Carbonate (GAVISCON PO) Take by mouth.    . Calcium Carbonate-Vit D-Min (CALCIUM 1200 PO) Take by mouth 2 (two) times daily.    . Cholecalciferol ([employer] VIT D 5000 HIGH-POTENCY PO) Take by mouth.    . Probiotic Product (ALIGN PO) Take by mouth.    . ranitidine (ZANTAC) 150 MG tablet Take by mouth.     No current facility-administered medications for this visit.   Facility-Administered Medications Ordered in Other Visits  Medication Dose Route Frequency Provider Last Rate Last Dose  . heparin lock flush 100 unit/mL  500 Units Intracatheter Daily PRN Cammie Sickle, MD      . heparin lock flush 100 unit/mL  250 Units Intracatheter PRN Cammie Sickle, MD      . sodium chloride flush (NS) 0.9 % injection 10 mL  10 mL Intracatheter PRN Cammie Sickle, MD      . sodium chloride flush (NS) 0.9 % injection 3 mL  3 mL Intracatheter PRN Cammie Sickle, MD      . technetium sestamibi (CARDIOLITE) injection 12 milli Curie  12 milli Curie Intravenous Once PRN [employer] MD   11.5 milli Curie at 09/26/15 [phone]  Review of Systems Review of Systems  Constitutional: Negative.   Respiratory: Negative.   Cardiovascular: Negative.     Blood pressure 130/70, pulse 74, resp. rate 12, height _0  (1.575 m), weight 166 lb (75.297 kg).  Physical Exam Physical Exam  Constitutional: She is oriented to person, place, and time. She appears well-developed and well-nourished.  Eyes: Conjunctivae are normal. No scleral icterus.  Neck: Neck supple.  Cardiovascular: Normal rate, regular rhythm and normal heart sounds.   Pulmonary/Chest: Effort normal and breath sounds normal.  Abdominal: Soft. Normal appearance and bowel sounds are normal. There is splenomegaly. There is no tenderness.    Lymphadenopathy:    She has no cervical adenopathy.    She has no axillary adenopathy.       Right: Inguinal  (one palpable node) adenopathy present.       Left: Supraclavicular (1cm, firm, deep seated) adenopathy present. No inguinal adenopathy present.  Neurological: She is alert and oriented to person, place, and time.  Skin: Skin is warm.    Data Reviewed PET scan, recent notes, labs  Assessment    Lymphoma, type uncertain at present. As requested by oncologist, plan for excisional biopsy of right inguinal node. Procedure explained to pt and she is agreeable.     Plan    Patient's surgery has been scheduled for 10-03-15 at [facility].    PCP:  Ronette Deter This information has been scribed by Gaspar Cola CMA.    Euriah Matlack G 09/28/2015, 3:49 PM

## 2015-09-29 ENCOUNTER — Telehealth: Payer: Self-pay | Admitting: *Deleted

## 2015-09-29 ENCOUNTER — Other Ambulatory Visit: Payer: Self-pay | Admitting: Internal Medicine

## 2015-09-29 ENCOUNTER — Telehealth: Payer: Self-pay | Admitting: Cardiology

## 2015-09-29 ENCOUNTER — Encounter: Admission: RE | Admit: 2015-09-29 | Payer: BLUE CROSS/BLUE SHIELD | Source: Ambulatory Visit

## 2015-09-29 ENCOUNTER — Encounter: Payer: Self-pay | Admitting: *Deleted

## 2015-09-29 ENCOUNTER — Inpatient Hospital Stay: Admission: RE | Admit: 2015-09-29 | Payer: BLUE CROSS/BLUE SHIELD | Source: Ambulatory Visit

## 2015-09-29 ENCOUNTER — Other Ambulatory Visit: Payer: Self-pay | Admitting: *Deleted

## 2015-09-29 DIAGNOSIS — D696 Thrombocytopenia, unspecified: Secondary | ICD-10-CM

## 2015-09-29 LAB — PREPARE PLATELET PHERESIS: UNIT DIVISION: 0

## 2015-09-29 NOTE — Telephone Encounter (Signed)
Patient is aware to have [facility] place the holter monitor on 10/05/2015 at 11:30. The patient understands instructions for where to go for the holter placement.  Order & demographics faxed on 09/29/2015.

## 2015-09-29 NOTE — Telephone Encounter (Signed)
Per Nira Conn in the Pre-admission Department, patient was scheduled for a stress test with [place] today. Patient cancelled test due to weakness.   Per Dr. Micah Flesher, stress test needs to be completed prior to left inguinal node excision due to tachycardia, chest pressure, and shortness of breath.   Surgery is presently scheduled for Monday, 10-03-15 at [facility]   Dr. Jamal Collin notified accordingly and will contact the patient.

## 2015-09-29 NOTE — Pre-Procedure Instructions (Signed)
CALLED DR VAN STAVERN REGARDING PT HAVING INTERMITTENT TACHYCARDIA, CHEST PRESSURE AND SOB.  EKG WAS DONE ON 09-14-15 THAT REVEALED NSR BUT WAS SENT TO CARDIOLOGIST DR INGAL WHO WANTED PT TO HAVE A STRESS TEST DONE TODAY WHICH PT CANCELLED DUE TO NOT FEELING UP TO IT.  DR VAN STAVERN SAID THAT PT NEEDS TO HAVE THE STRESS TEST DONE PRIOR TO HER SURGERY. I CALLED MICHELE AT DR [facility] OFFICE AND INFORMED HER OF THIS AND THAT I WILL BE FAXING OVER CLEARANCE FORM STATING THAT PT NEEDS STRESS TEST DONE PRIOR TO SURGERY

## 2015-09-29 NOTE — Patient Instructions (Signed)
  Your procedure is scheduled on: 10-03-15 Report to [place] To find out your arrival time please call [phone] between 1PM - 3PM on 09-30-15  Remember: Instructions that are not followed completely may result in serious medical risk, up to and including death, or upon the discretion of your surgeon and anesthesiologist your surgery may need to be rescheduled.    _X___ 1. Do not eat food or drink liquids after midnight. No gum chewing or hard candies.     _X___ 2. No Alcohol for 24 hours before or after surgery.   ____ 3. Bring all medications with you on the day of surgery if instructed.    ____ 4. Notify your doctor if there is any change in your medical condition     (cold, fever, infections).     Do not wear jewelry, make-up, hairpins, clips or nail polish.  Do not wear lotions, powders, or perfumes. You may wear deodorant.  Do not shave 48 hours prior to surgery. Men may shave face and neck.  Do not bring valuables to the hospital.    [facility] is not responsible for any belongings or valuables.               Contacts, dentures or bridgework may not be worn into surgery.  Leave your suitcase in the car. After surgery it may be brought to your room.  For patients admitted to the hospital, discharge time is determined by your treatment team.   Patients discharged the day of surgery will not be allowed to drive home.   Please read over the following fact sheets that you were given:     _X___ Take these medicines the morning of surgery with A SIP OF WATER:    1. ZANTAC  2.   3.   4.  5.  6.  ____ Fleet Enema (as directed)   ____ Use CHG Soap as directed  ____ Use inhalers on the day of surgery  ____ Stop metformin 2 days prior to surgery    ____ Take 1/2 of usual insulin dose the night before surgery and none on the morning of surgery.   ____ Stop Coumadin/Plavix/aspirin-N/A  __X__ Stop Anti-inflammatories-NO NSAIDS OR ASA  PRODUCTS-TYLENOL OK TO TAKE   __X__ Stop supplements until after surgery-STOP PROBIOTIC NOW   ____ Bring C-Pap to the hospital.

## 2015-09-29 NOTE — Progress Notes (Signed)
RN spoke with Britt Bottom, RN in preadmit testing. Hand off provided. Dr. Rogue Bussing entered orders for 1 unit irradiated plt component. Will transfuse on Monday in SDS prior to lymph node biopsy. Britt Bottom, RN to contact pt to let her know her arrival time on Monday 10/03/15. She will also communicate to SDS dept that plt components will be released today in preparation for Monday 10/03/15.  plt components were released in chl. I also contacted blood bank to let them know that the plt components will be transfused in SDS on Monday prior to procedure in SDS. No bloodgroup/rh needed at this time. Pt previously received plt component this week (on Wed ) prior to her lumbar puncture.

## 2015-09-29 NOTE — Telephone Encounter (Signed)
Nurse with Nuclear medicine called, states pt had lumbar puncture, that did not go well, therefore, pt husband cancelled stress test for today. Please call to r/s.

## 2015-09-30 ENCOUNTER — Telehealth: Payer: Self-pay | Admitting: Cardiology

## 2015-09-30 ENCOUNTER — Encounter: Payer: Self-pay | Admitting: Emergency Medicine

## 2015-09-30 ENCOUNTER — Emergency Department: Payer: BLUE CROSS/BLUE SHIELD

## 2015-09-30 ENCOUNTER — Ambulatory Visit: Payer: BLUE CROSS/BLUE SHIELD | Admitting: Internal Medicine

## 2015-09-30 ENCOUNTER — Other Ambulatory Visit: Payer: Self-pay | Admitting: Family Medicine

## 2015-09-30 ENCOUNTER — Other Ambulatory Visit: Payer: Self-pay | Admitting: Internal Medicine

## 2015-09-30 ENCOUNTER — Emergency Department
Admission: EM | Admit: 2015-09-30 | Discharge: 2015-09-30 | Disposition: A | Discharge status: Home / Self Care | Payer: BLUE CROSS/BLUE SHIELD | Attending: Emergency Medicine | Admitting: Emergency Medicine

## 2015-09-30 ENCOUNTER — Telehealth: Payer: Self-pay | Admitting: Internal Medicine

## 2015-09-30 DIAGNOSIS — E669 Obesity, unspecified: Secondary | ICD-10-CM | POA: Diagnosis not present

## 2015-09-30 DIAGNOSIS — M1990 Unspecified osteoarthritis, unspecified site: Secondary | ICD-10-CM | POA: Diagnosis not present

## 2015-09-30 DIAGNOSIS — G4489 Other headache syndrome: Secondary | ICD-10-CM | POA: Insufficient documentation

## 2015-09-30 DIAGNOSIS — M810 Age-related osteoporosis without current pathological fracture: Secondary | ICD-10-CM | POA: Diagnosis not present

## 2015-09-30 DIAGNOSIS — R51 Headache: Secondary | ICD-10-CM | POA: Diagnosis present

## 2015-09-30 DIAGNOSIS — G971 Other reaction to spinal and lumbar puncture: Secondary | ICD-10-CM

## 2015-09-30 LAB — COMPREHENSIVE METABOLIC PANEL
ALK PHOS: 99 U/L (ref 38–126)
ALT: 30 U/L (ref 14–54)
ANION GAP: 17 — AB (ref 5–15)
AST: 33 U/L (ref 15–41)
Albumin: 4.4 g/dL (ref 3.5–5.0)
BILIRUBIN TOTAL: 0.6 mg/dL (ref 0.3–1.2)
BUN: 16 mg/dL (ref 6–20)
CALCIUM: 9.6 mg/dL (ref 8.9–10.3)
CO2: 22 mmol/L (ref 22–32)
CREATININE: 0.82 mg/dL (ref 0.44–1.00)
Chloride: 101 mmol/L (ref 101–111)
GFR calc non Af Amer: >60 mL/min (ref >60)
Glucose, Bld: 97 mg/dL (ref 65–99)
Potassium: 4.2 mmol/L (ref 3.5–5.1)
SODIUM: 140 mmol/L (ref 135–145)
TOTAL PROTEIN: 6.6 g/dL (ref 6.5–8.1)

## 2015-09-30 LAB — CBC WITH DIFFERENTIAL/PLATELET
Basophils Absolute: 0 10*3/uL (ref 0–0.1)
Basophils Relative: 1 %
EOS PCT: 1 %
Eosinophils Absolute: 0 10*3/uL (ref 0–0.7)
HEMATOCRIT: 39.2 % (ref 35.0–47.0)
HEMOGLOBIN: 12.9 g/dL (ref 12.0–16.0)
LYMPHS PCT: 21 %
Lymphs Abs: 0.4 10*3/uL — ABNORMAL LOW (ref 1.0–3.6)
MCH: 28.5 pg (ref 26.0–34.0)
MCHC: 32.8 g/dL (ref 32.0–36.0)
MCV: 87 fL (ref 80.0–100.0)
MONO ABS: 0.6 10*3/uL (ref 0.2–0.9)
MONOS PCT: 34 %
NEUTROS PCT: 43 %
Neutro Abs: 0.9 10*3/uL — ABNORMAL LOW (ref 1.4–6.5)
Platelets: 53 10*3/uL — ABNORMAL LOW (ref 150–440)
RBC: 4.5 MIL/uL (ref 3.80–5.20)
RDW: 17.1 % — ABNORMAL HIGH (ref 11.5–14.5)
WBC: 1.9 10*3/uL — AB (ref 3.6–11.0)

## 2015-09-30 MED ORDER — SODIUM CHLORIDE 0.9 % IV SOLN
Freq: Once | INTRAVENOUS | Status: AC
  Administered 2015-09-30: 09:00:00 via INTRAVENOUS

## 2015-09-30 MED ORDER — KETOROLAC TROMETHAMINE 30 MG/ML IJ SOLN
30.0000 mg | Freq: Once | INTRAMUSCULAR | Status: AC
  Administered 2015-09-30: 30 mg via INTRAVENOUS
  Filled 2015-09-30: qty 1

## 2015-09-30 MED ORDER — DIAZEPAM 5 MG/ML IJ SOLN
5.0000 mg | Freq: Once | INTRAMUSCULAR | Status: AC
  Administered 2015-09-30: 5 mg via INTRAVENOUS
  Filled 2015-09-30: qty 2

## 2015-09-30 MED ORDER — DIAZEPAM 5 MG PO TABS: 5.0000 mg | ORAL_TABLET | Freq: Three times a day (TID) | ORAL | Status: DC | PRN

## 2015-09-30 MED ORDER — METOCLOPRAMIDE HCL 5 MG/ML IJ SOLN
10.0000 mg | Freq: Once | INTRAMUSCULAR | Status: AC
  Administered 2015-09-30: 10 mg via INTRAVENOUS
  Filled 2015-09-30: qty 2

## 2015-09-30 MED ORDER — BUTALBITAL-APAP-CAFFEINE 50-325-40 MG PO TABS: 1.0000 | ORAL_TABLET | Freq: Four times a day (QID) | ORAL | Status: DC | PRN

## 2015-09-30 MED ORDER — BUTALBITAL-APAP-CAFFEINE 50-325-40 MG PO TABS
2.0000 | ORAL_TABLET | Freq: Once | ORAL | Status: AC
  Administered 2015-09-30: 2 via ORAL
  Filled 2015-09-30: qty 2

## 2015-09-30 NOTE — ED Notes (Signed)
Pt newly dx with lymphoma, had LP 2 days ago.  Ha since

## 2015-09-30 NOTE — Telephone Encounter (Signed)
Patient canceled her appointment at [facility]- as she had to go to the emergency room for spinal headache. Multiple phone calls-with multiple doctors regarding clearance for  Anesthesia for lymph node biopsy. Cardiology recommends a stress test for clearance prior to anesthesia. Spoke to [place]- was kind to  Agreed to facilitate stress test on May 22. Spoke to Dr. Jamal Collin- plan lymph node biopsy on May 24th/Wednesday. Will transfuse platelets prior to procedure. We will plan chemotherapy on May 25/Thursday.   Spoke to the patient/husband- asked them to call  Cardiologist office this evening- Re: Timing of the stress test for Monday.

## 2015-09-30 NOTE — ED Provider Notes (Signed)
[facility] Emergency Department Provider Note        Time seen: ----------------------------------------- 8:44 AM on 09/30/2015 -----------------------------------------    I have reviewed the triage vital signs and the nursing notes.   HISTORY  Chief Complaint Headache    HPI Stacy Murillo is a 60 y.o. female who presents to ER 2 days after having a lumbar puncture. Patient LP 2 days ago for diagnostic reasons for her lymphoma. She's had a headache that has been worsening since that period time. Patient describes a headache from her nose to the top of her head. It's worse with standing. She did not had any numbness tingling or weakness.   Past Medical History  Diagnosis Date  . Tachycardia   . Overweight(278.02)     Obesity  . Chest pain, unspecified   . Palpitations   . Osteoporosis   . Thrombocytopenia ([place])   . Lymphoma ([place]) 09/14/15    Monoclonal B cell lymphoma  . Diverticulosis 07/06/15  . Esophagitis 07/06/15  . Gastritis 07/06/15  . 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 GERD (gastroesophageal reflux disease)   . Fatigue   . Leg cramps   . Night sweats   . Cancer ([place])   . Dysrhythmia   . Shortness of breath dyspnea   . Arthritis   . Blood dyscrasia     Patient Active Problem List   Diagnosis Date Noted  . 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 Palpitation 09/14/2015  . Chest tightness 09/14/2015  . SOB (shortness of breath) on exertion 09/14/2015  . Palpitations 08/24/2015  . Thrombocytopenia ([place]) 08/24/2015  . Left knee pain 07/20/2014  . Achilles tendonitis 07/20/2014  . Esophageal reflux 04/06/2014  . Special screening for malignant neoplasms, colon 04/06/2014  . Routine general medical examination at a health care facility 07/17/2012  . Screening for breast cancer 07/10/2011  . Osteopenia 07/06/2011  . Vitamin D deficiency 07/06/2011  . Other and unspecified hyperlipidemia 07/06/2011  .  Osteoporosis     Past Surgical History  Procedure Laterality Date  . Abdominal hysterectomy  2005  . Bone marrow biopsy  09/14/15  . Colonoscopy  07/05/2014  . Esophagogastroduodenoscopy  07/05/2014  . Portacath placement Right   . Peripheral vascular catheterization N/A 09/27/2015    Procedure: Glori Luis Cath Insertion;  Surgeon: Algernon Huxley, MD;  Location: [place] [facility];  Service: Cardiovascular;  Laterality: N/A;  . Cholecystectomy  1997    Allergies Review of patient's allergies indicates no known allergies.  Social History Social History  Substance Use Topics  . Smoking status: Never Smoker   . Smokeless tobacco: Never Used  . Alcohol Use: No    Review of Systems Constitutional: Negative for fever. Eyes: Negative for visual changes. ENT: Negative for sore throat. Cardiovascular: Negative for chest pain. Respiratory: Negative for shortness of breath. Gastrointestinal: Negative for abdominal pain, vomiting and diarrhea. Genitourinary: Negative for dysuria. Musculoskeletal: Negative for back pain. Skin: Negative for rash. Neurological: Positive for headache  10-point ROS otherwise negative.  ____________________________________________   PHYSICAL EXAM:  VITAL SIGNS: ED Triage Vitals  Enc Vitals Group     BP 09/30/15 0834 141/76 mmHg     Pulse Rate 09/30/15 0834 90     Resp 09/30/15 0834 16     Temp 09/30/15 0834 97.9 F (36.6 C)     Temp Source 09/30/15 0834 Oral     SpO2 09/30/15 0834 97 %     Weight 09/30/15 0834  166 lb (75.297 kg)     Height 09/30/15 0834 5' 2"  (1.575 m)     Head Cir --      Peak Flow --      Pain Score 09/30/15 0818 10     Pain Loc --      Pain Edu? --      Excl. in [place]? --     Constitutional: Alert and oriented. Well appearing and in no distress. Eyes: Conjunctivae are normal. PERRL. Normal extraocular movements.No photophobia ENT   Head: Normocephalic and atraumatic.   Nose: No congestion/rhinnorhea.   Mouth/Throat:  Mucous membranes are moist.   Neck: No stridor. Cardiovascular: Normal rate, regular rhythm. No murmurs, rubs, or gallops. Respiratory: Normal respiratory effort without tachypnea nor retractions. Breath sounds are clear and equal bilaterally. No wheezes/rales/rhonchi. Gastrointestinal: Soft and nontender. Normal bowel sounds Musculoskeletal: Nontender with normal range of motion in all extremities. No lower extremity tenderness nor edema. Neurologic:  Normal speech and language. No gross focal neurologic deficits are appreciated.  Skin:  Bruising on the right chest wall from recent port version Psychiatric: Mood and affect are normal. Speech and behavior are normal.   ____________________________________________  ED COURSE:  Pertinent labs & imaging results that were available during my care of the patient were reviewed by me and considered in my medical decision making (see chart for details). Patient presents for headache which is likely a spinal headache. I will check basic labs and CT imaging. ____________________________________________    LABS (pertinent positives/negatives)  Labs Reviewed  CBC WITH DIFFERENTIAL/PLATELET - Abnormal; Notable for the following:    WBC 1.9 (*)    RDW 17.1 (*)    Platelets 53 (*)    Neutro Abs 0.9 (*)    Lymphs Abs 0.4 (*)    All other components within normal limits  COMPREHENSIVE METABOLIC PANEL - Abnormal; Notable for the following:    Anion gap 17 (*)    All other components within normal limits    RADIOLOGY Images were viewed by me  CT head IMPRESSION: No acute intracranial findings. Normal ventricular volume.  ____________________________________________  FINAL ASSESSMENT AND PLAN  Spinal headache  Plan: Patient with labs and imaging as dictated above. Patient is feeling much better now after receiving Fioricet, fluids and Valium. Spinal headache appears to have resolved for the moment. She'll be discharged with Fioricet  and Valium. She is encouraged to have close follow-up with her doctor for worsening symptoms.   Earleen Newport, MD   Note: This dictation was prepared with Dragon dictation. Any transcriptional errors that result from this process are unintentional   Earleen Newport, MD 09/30/15 1113

## 2015-09-30 NOTE — Discharge Instructions (Signed)
Spinal Headache A spinal headache is a severe headache that can happen after getting a spinal tap, also called lumbar puncture, or an epidural anesthetic. Both of these procedures involve passing a needle through ligaments that run along the back side of your spinal column and into one of the spaces just above your spinal cord. Sometimes spinal fluid leaks through the temporary hole left by the needle. This leak causes a decrease in spinal fluid pressure, which leads to a spinal headache. The headache usually begins within hours or 1-2 days after the procedure, and it lasts until adequate pressure returns as your body creates more spinal fluid. The headache can last a few days and rarely lasts for more than 1 week. SIGNS AND SYMPTOMS   Severe headache pain when sitting or standing.  Decreased headache pain when lying down.  Neck pain, especially when flexing the neck in a chin-to-chest position.  Vomiting. DIAGNOSIS  Diagnosis of spinal headache is usually made based on your recent medical history. Your health care provider will consider the timing of a recent spinal tap or epidural anesthetic, along with how soon your headache occurred afterward. On rare occasions, tests may be done to confirm the diagnosis, such as an MRI. TREATMENT  Treatment may include:  Drinking extra fluids to improve your level of hydration. This will help your body replace the spinal fluid that has leaked out through the needle hole. Receiving IV fluids may be necessary.  Taking pain medicine as prescribed by your health care provider.  Drinking caffeinated beverages such as soda, coffee, or tea. Caffeine may help to shrink the blood vessels in your brain, which may reduce your headache pain.  Lying flat for a few days.  Having a blood patch procedure, which involves injecting a small amount of your blood at the puncture site to seal the leak. HOME CARE INSTRUCTIONS  Lie down to relieve pain if your pain gets  worse when you sit or stand.  Drink enough fluids to keep your urine clear or pale yellow.  Take pain medicine as directed by your health care provider. SEEK IMMEDIATE MEDICAL CARE IF:   Your pain becomes very severe or cannot be controlled.  You develop a fever.  You have a stiff neck.  You lose bowel or bladder control.  You have trouble walking. MAKE SURE YOU:  Understand these instructions.  Will watch your condition.   Will get help right away if you are not doing well or get worse.   This information is not intended to replace advice given to you by your health care provider. Make sure you discuss any questions you have with your health care provider.   Document Released: 10/20/2001 Document Revised: 05/05/2013 Document Reviewed: 11/20/2012 Elsevier Interactive Patient Education [employer].

## 2015-09-30 NOTE — Telephone Encounter (Signed)
Pt husband would like to r/s pt nuclear stress test. Please call.

## 2015-09-30 NOTE — Telephone Encounter (Signed)
Spoke with patient and let her know that stress test was scheduled for Monday at 09:30AM and to arrive at 09:15AM. Reviewed all instructions and patient verbalized understanding.

## 2015-09-30 NOTE — Telephone Encounter (Signed)
Received phone call requesting that I speak to Dr. Andree Elk (anesthesia). I am informed that pt cancelled stress test ordered for CP, SOB. Pt is scheduled for inguinal lymph node biopsy on 5/22. I discussed that if procedure is done with local anesthesia, I do not see any contraindication cardiac wise. If pt requires anything other than local anesthesia, likely would be best to complete ischemia evaluation first. I discussed with him that cardiac evaluation was done at the request of PCP for evaluation of CP and SOB. Based on these and her risk factors for CAD, ischemia evaluation was recommended. The diagnosis of lymphoma came after her cardiology office visit.

## 2015-10-03 ENCOUNTER — Ambulatory Visit
Admission: RE | Admit: 2015-10-03 | Discharge: 2015-10-03 | Disposition: A | Discharge status: Home / Self Care | Payer: BLUE CROSS/BLUE SHIELD | Source: Ambulatory Visit | Attending: Cardiology | Admitting: Cardiology

## 2015-10-03 ENCOUNTER — Inpatient Hospital Stay: Payer: BLUE CROSS/BLUE SHIELD

## 2015-10-03 ENCOUNTER — Telehealth: Payer: Self-pay | Admitting: Cardiology

## 2015-10-03 ENCOUNTER — Inpatient Hospital Stay (HOSPITAL_BASED_OUTPATIENT_CLINIC_OR_DEPARTMENT_OTHER): Payer: BLUE CROSS/BLUE SHIELD | Admitting: Internal Medicine

## 2015-10-03 ENCOUNTER — Inpatient Hospital Stay: Payer: BLUE CROSS/BLUE SHIELD | Admitting: Internal Medicine

## 2015-10-03 ENCOUNTER — Ambulatory Visit: Payer: BLUE CROSS/BLUE SHIELD | Admitting: Internal Medicine

## 2015-10-03 VITALS — BP 122/76 | HR 120 | Temp 95.9°F | Resp 18

## 2015-10-03 DIAGNOSIS — R161 Splenomegaly, not elsewhere classified: Secondary | ICD-10-CM

## 2015-10-03 DIAGNOSIS — D696 Thrombocytopenia, unspecified: Secondary | ICD-10-CM

## 2015-10-03 DIAGNOSIS — R0602 Shortness of breath: Secondary | ICD-10-CM

## 2015-10-03 DIAGNOSIS — R002 Palpitations: Secondary | ICD-10-CM | POA: Diagnosis not present

## 2015-10-03 DIAGNOSIS — Z95828 Presence of other vascular implants and grafts: Secondary | ICD-10-CM

## 2015-10-03 DIAGNOSIS — C8583 Other specified types of non-Hodgkin lymphoma, intra-abdominal lymph nodes: Secondary | ICD-10-CM

## 2015-10-03 DIAGNOSIS — C8521 Mediastinal (thymic) large B-cell lymphoma, lymph nodes of head, face, and neck: Secondary | ICD-10-CM

## 2015-10-03 DIAGNOSIS — Z79899 Other long term (current) drug therapy: Secondary | ICD-10-CM

## 2015-10-03 DIAGNOSIS — R0789 Other chest pain: Secondary | ICD-10-CM | POA: Diagnosis not present

## 2015-10-03 DIAGNOSIS — R110 Nausea: Secondary | ICD-10-CM

## 2015-10-03 DIAGNOSIS — G971 Other reaction to spinal and lumbar puncture: Secondary | ICD-10-CM | POA: Diagnosis not present

## 2015-10-03 DIAGNOSIS — E860 Dehydration: Secondary | ICD-10-CM

## 2015-10-03 LAB — NM MYOCAR MULTI W/SPECT W/WALL MOTION / EF
CHL CUP NUCLEAR SSS: 0
CSEPHR: 81 %
CSEPPHR: 130 {beats}/min
LVDIAVOL: 90 mL (ref 46–106)
LVSYSVOL: 39 mL
NUC STRESS TID: 1.25
Rest HR: 104 {beats}/min
SDS: 0
SRS: 0

## 2015-10-03 LAB — CBC WITH DIFFERENTIAL/PLATELET
BASOS ABS: 0.1 10*3/uL (ref 0–0.1)
EOS ABS: 0 10*3/uL (ref 0–0.7)
HCT: 40.5 % (ref 35.0–47.0)
Hemoglobin: 13.5 g/dL (ref 12.0–16.0)
Lymphocytes Relative: 15 %
Lymphs Abs: 0.6 10*3/uL — ABNORMAL LOW (ref 1.0–3.6)
MCH: 28.8 pg (ref 26.0–34.0)
MCHC: 33.4 g/dL (ref 32.0–36.0)
MCV: 86.3 fL (ref 80.0–100.0)
MONO ABS: 0.9 10*3/uL (ref 0.2–0.9)
Monocytes Relative: 25 %
Neutro Abs: 2.1 10*3/uL (ref 1.4–6.5)
Neutrophils Relative %: 58 %
PLATELETS: 64 10*3/uL — AB (ref 150–440)
RBC: 4.69 MIL/uL (ref 3.80–5.20)
RDW: 17.2 % — AB (ref 11.5–14.5)
WBC: 3.8 10*3/uL (ref 3.6–11.0)

## 2015-10-03 LAB — COMPREHENSIVE METABOLIC PANEL
ALT: 61 U/L — AB (ref 14–54)
AST: 48 U/L — AB (ref 15–41)
Albumin: 4.4 g/dL (ref 3.5–5.0)
Alkaline Phosphatase: 146 U/L — ABNORMAL HIGH (ref 38–126)
Anion gap: 19 — ABNORMAL HIGH (ref 5–15)
BUN: 17 mg/dL (ref 6–20)
CHLORIDE: 99 mmol/L — AB (ref 101–111)
CO2: 18 mmol/L — AB (ref 22–32)
CREATININE: 0.81 mg/dL (ref 0.44–1.00)
Calcium: 10.1 mg/dL (ref 8.9–10.3)
GFR calc Af Amer: >60 mL/min (ref >60)
GFR calc non Af Amer: >60 mL/min (ref >60)
Glucose, Bld: 117 mg/dL — ABNORMAL HIGH (ref 65–99)
POTASSIUM: 4.9 mmol/L (ref 3.5–5.1)
SODIUM: 136 mmol/L (ref 135–145)
Total Bilirubin: 0.7 mg/dL (ref 0.3–1.2)
Total Protein: 6.6 g/dL (ref 6.5–8.1)

## 2015-10-03 LAB — LACTATE DEHYDROGENASE: LDH: 415 U/L — ABNORMAL HIGH (ref 98–192)

## 2015-10-03 MED ORDER — ONDANSETRON HCL 8 MG PO TABS: 8.0000 mg | ORAL_TABLET | Freq: Three times a day (TID) | ORAL | Status: DC | PRN

## 2015-10-03 MED ORDER — OXYCODONE-ACETAMINOPHEN 7.5-325 MG PO TABS: 1.0000 | ORAL_TABLET | Freq: Four times a day (QID) | ORAL | Status: DC | PRN

## 2015-10-03 MED ORDER — HEPARIN SOD (PORK) LOCK FLUSH 100 UNIT/ML IV SOLN
500.0000 [IU] | Freq: Once | INTRAVENOUS | Status: AC
  Administered 2015-10-03: 500 [IU] via INTRAVENOUS
  Filled 2015-10-03: qty 5

## 2015-10-03 MED ORDER — SODIUM CHLORIDE 0.9 % IV SOLN
INTRAVENOUS | Status: DC
  Administered 2015-10-03: 13:00:00 via INTRAVENOUS
  Filled 2015-10-03: qty 1000

## 2015-10-03 MED ORDER — PROCHLORPERAZINE MALEATE 10 MG PO TABS: 10.0000 mg | ORAL_TABLET | Freq: Four times a day (QID) | ORAL | Status: DC | PRN

## 2015-10-03 MED ORDER — TECHNETIUM TC 99M TETROFOSMIN IV KIT
13.4500 | PACK | Freq: Once | INTRAVENOUS | Status: AC | PRN
  Administered 2015-10-03: 13.45 via INTRAVENOUS

## 2015-10-03 MED ORDER — REGADENOSON 0.4 MG/5ML IV SOLN
0.4000 mg | Freq: Once | INTRAVENOUS | Status: AC
  Administered 2015-10-03: 0.4 mg via INTRAVENOUS

## 2015-10-03 MED ORDER — TECHNETIUM TC 99M TETROFOSMIN IV KIT
30.0000 | PACK | Freq: Once | INTRAVENOUS | Status: AC | PRN
  Administered 2015-10-03: 31.16 via INTRAVENOUS

## 2015-10-03 MED ORDER — ONDANSETRON HCL 40 MG/20ML IJ SOLN
Freq: Once | INTRAMUSCULAR | Status: AC
  Administered 2015-10-03: 20 mg via INTRAVENOUS
  Filled 2015-10-03: qty 4

## 2015-10-03 MED ORDER — PREDNISONE 50 MG PO TABS: ORAL_TABLET | ORAL | Status: DC

## 2015-10-03 MED ORDER — SODIUM CHLORIDE 0.9% FLUSH
10.0000 mL | INTRAVENOUS | Status: DC | PRN
  Administered 2015-10-03: 10 mL via INTRAVENOUS
  Filled 2015-10-03: qty 10

## 2015-10-03 MED ORDER — LIDOCAINE-PRILOCAINE 2.5-2.5 % EX CREA: 1.0000 "application " | TOPICAL_CREAM | CUTANEOUS | Status: DC | PRN

## 2015-10-03 NOTE — Patient Instructions (Signed)
Rituximab injection What is this medicine? RITUXIMAB (ri TUX i mab) is a monoclonal antibody. It is used commonly to treat non-Hodgkin lymphoma and other conditions. It is also used to treat rheumatoid arthritis (RA). In RA, this medicine slows the inflammatory process and help reduce joint pain and swelling. This medicine is often used with other cancer or arthritis medications. This medicine may be used for other purposes; ask your health care provider or pharmacist if you have questions. What should I tell my health care provider before I take this medicine? They need to know if you have any of these conditions: -blood disorders -heart disease -history of hepatitis B -infection (especially a virus infection such as chickenpox, cold sores, or herpes) -irregular heartbeat -kidney disease -lung or breathing disease, like asthma -lupus -an unusual or allergic reaction to rituximab, mouse proteins, other medicines, foods, dyes, or preservatives -pregnant or trying to get pregnant -breast-feeding How should I use this medicine? This medicine is for infusion into a vein. It is administered in a hospital or clinic by a specially trained health care professional. A special MedGuide will be given to you by the pharmacist with each prescription and refill. Be sure to read this information carefully each time. Talk to your pediatrician regarding the use of this medicine in children. This medicine is not approved for use in children. Overdosage: If you think you have taken too much of this medicine contact a poison control center or emergency room at once. NOTE: This medicine is only for you. Do not share this medicine with others. What if I miss a dose? It is important not to miss a dose. Call your doctor or health care professional if you are unable to keep an appointment. What may interact with this medicine? -cisplatin -medicines for blood pressure -some other medicines for  arthritis -vaccines This list may not describe all possible interactions. Give your health care provider a list of all the medicines, herbs, non-prescription drugs, or dietary supplements you use. Also tell them if you smoke, drink alcohol, or use illegal drugs. Some items may interact with your medicine. What should I watch for while using this medicine? Report any side effects that you notice during your treatment right away, such as changes in your breathing, fever, chills, dizziness or lightheadedness. These effects are more common with the first dose. Visit your prescriber or health care professional for checks on your progress. You will need to have regular blood work. Report any other side effects. The side effects of this medicine can continue after you finish your treatment. Continue your course of treatment even though you feel ill unless your doctor tells you to stop. Call your doctor or health care professional for advice if you get a fever, chills or sore throat, or other symptoms of a cold or flu. Do not treat yourself. This drug decreases your body's ability to fight infections. Try to avoid being around people who are sick. This medicine may increase your risk to bruise or bleed. Call your doctor or health care professional if you notice any unusual bleeding. Be careful brushing and flossing your teeth or using a toothpick because you may get an infection or bleed more easily. If you have any dental work done, tell your dentist you are receiving this medicine. Avoid taking products that contain aspirin, acetaminophen, ibuprofen, naproxen, or ketoprofen unless instructed by your doctor. These medicines may hide a fever. Do not become pregnant while taking this medicine. Women should inform their doctor if  they wish to become pregnant or think they might be pregnant. There is a potential for serious side effects to an unborn child. Talk to your health care professional or pharmacist for more  information. Do not breast-feed an infant while taking this medicine. What side effects may I notice from receiving this medicine? Side effects that you should report to your doctor or health care professional as soon as possible: -allergic reactions like skin rash, itching or hives, swelling of the face, lips, or tongue -low blood counts - this medicine may decrease the number of white blood cells, red blood cells and platelets. You may be at increased risk for infections and bleeding. -signs of infection - fever or chills, cough, sore throat, pain or difficulty passing urine -signs of decreased platelets or bleeding - bruising, pinpoint red spots on the skin, black, tarry stools, blood in the urine -signs of decreased red blood cells - unusually weak or tired, fainting spells, lightheadedness -breathing problems -confused, not responsive -chest pain -fast, irregular heartbeat -feeling faint or lightheaded, falls -mouth sores -redness, blistering, peeling or loosening of the skin, including inside the mouth -stomach pain -swelling of the ankles, feet, or hands -trouble passing urine or change in the amount of urine Side effects that usually do not require medical attention (report to your doctor or other health care professional if they continue or are bothersome): -anxiety -headache -loss of appetite -muscle aches -nausea -night sweats This list may not describe all possible side effects. Call your doctor for medical advice about side effects. You may report side effects to FDA at 1-800-FDA-1088. Where should I keep my medicine? This drug is given in a hospital or clinic and will not be stored at home. NOTE: This sheet is a summary. It may not cover all possible information. If you have questions about this medicine, talk to your doctor, pharmacist, or health care provider.    2016, [employer]. (2014-07-07 22:30:56)  Doxorubicin injection What is this  medicine? DOXORUBICIN (dox oh ROO bi sin) is a chemotherapy drug. It is used to treat many kinds of cancer like Hodgkin's disease, leukemia, non-Hodgkin's lymphoma, neuroblastoma, sarcoma, and Wilms' tumor. It is also used to treat bladder cancer, breast cancer, lung cancer, ovarian cancer, stomach cancer, and thyroid cancer. This medicine may be used for other purposes; ask your health care provider or pharmacist if you have questions. What should I tell my health care provider before I take this medicine? They need to know if you have any of these conditions: -blood disorders -heart disease, recent heart attack -infection (especially a virus infection such as chickenpox, cold sores, or herpes) -irregular heartbeat -liver disease -recent or ongoing radiation therapy -an unusual or allergic reaction to doxorubicin, other chemotherapy agents, other medicines, foods, dyes, or preservatives -pregnant or trying to get pregnant -breast-feeding How should I use this medicine? This drug is given as an infusion into a vein. It is administered in a hospital or clinic by a specially trained health care professional. If you have pain, swelling, burning or any unusual feeling around the site of your injection, tell your health care professional right away. Talk to your pediatrician regarding the use of this medicine in children. Special care may be needed. Overdosage: If you think you have taken too much of this medicine contact a poison control center or emergency room at once. NOTE: This medicine is only for you. Do not share this medicine with others. What if I miss a dose? It is important   not to miss your dose. Call your doctor or health care professional if you are unable to keep an appointment. What may interact with this medicine? Do not take this medicine with any of the following medications: -cisapride -droperidol -halofantrine -pimozide -zidovudine This medicine may also interact with the  following medications: -chloroquine -chlorpromazine -clarithromycin -cyclophosphamide -cyclosporine -erythromycin -medicines for depression, anxiety, or psychotic disturbances -medicines for irregular heart beat like amiodarone, bepridil, dofetilide, encainide, flecainide, propafenone, quinidine -medicines for seizures like ethotoin, fosphenytoin, phenytoin -medicines for nausea, vomiting like dolasetron, ondansetron, palonosetron -medicines to increase blood counts like filgrastim, pegfilgrastim, sargramostim -methadone -methotrexate -pentamidine -progesterone -vaccines -verapamil Talk to your doctor or health care professional before taking any of these medicines: -acetaminophen -aspirin -ibuprofen -ketoprofen -naproxen This list may not describe all possible interactions. Give your health care provider a list of all the medicines, herbs, non-prescription drugs, or dietary supplements you use. Also tell them if you smoke, drink alcohol, or use illegal drugs. Some items may interact with your medicine. What should I watch for while using this medicine? Your condition will be monitored carefully while you are receiving this medicine. You will need important blood work done while you are taking this medicine. This drug may make you feel generally unwell. This is not uncommon, as chemotherapy can affect healthy cells as well as cancer cells. Report any side effects. Continue your course of treatment even though you feel ill unless your doctor tells you to stop. Your urine may turn red for a few days after your dose. This is not blood. If your urine is dark or brown, call your doctor. In some cases, you may be given additional medicines to help with side effects. Follow all directions for their use. Call your doctor or health care professional for advice if you get a fever, chills or sore throat, or other symptoms of a cold or flu. Do not treat yourself. This drug decreases your body's  ability to fight infections. Try to avoid being around people who are sick. This medicine may increase your risk to bruise or bleed. Call your doctor or health care professional if you notice any unusual bleeding. Be careful brushing and flossing your teeth or using a toothpick because you may get an infection or bleed more easily. If you have any dental work done, tell your dentist you are receiving this medicine. Avoid taking products that contain aspirin, acetaminophen, ibuprofen, naproxen, or ketoprofen unless instructed by your doctor. These medicines may hide a fever. Men and women of childbearing age should use effective birth control methods while using taking this medicine. Do not become pregnant while taking this medicine. There is a potential for serious side effects to an unborn child. Talk to your health care professional or pharmacist for more information. Do not breast-feed an infant while taking this medicine. Do not let others touch your urine or other body fluids for 5 days after each treatment with this medicine. Caregivers should wear latex gloves to avoid touching body fluids during this time. There is a maximum amount of this medicine you should receive throughout your life. The amount depends on the medical condition being treated and your overall health. Your doctor will watch how much of this medicine you receive in your lifetime. Tell your doctor if you have taken this medicine before. What side effects may I notice from receiving this medicine? Side effects that you should report to your doctor or health care professional as soon as possible: -allergic reactions like skin rash,   itching or hives, swelling of the face, lips, or tongue -low blood counts - this medicine may decrease the number of white blood cells, red blood cells and platelets. You may be at increased risk for infections and bleeding. -signs of infection - fever or chills, cough, sore throat, pain or difficulty  passing urine -signs of decreased platelets or bleeding - bruising, pinpoint red spots on the skin, black, tarry stools, blood in the urine -signs of decreased red blood cells - unusually weak or tired, fainting spells, lightheadedness -breathing problems -chest pain -fast, irregular heartbeat -mouth sores -nausea, vomiting -pain, swelling, redness at site where injected -pain, tingling, numbness in the hands or feet -swelling of ankles, feet, or hands -unusual bleeding or bruising Side effects that usually do not require medical attention (report to your doctor or health care professional if they continue or are bothersome): -diarrhea -facial flushing -hair loss -loss of appetite -missed menstrual periods -nail discoloration or damage -red or watery eyes -red colored urine -stomach upset This list may not describe all possible side effects. Call your doctor for medical advice about side effects. You may report side effects to FDA at 1-800-FDA-1088. Where should I keep my medicine? This drug is given in a hospital or clinic and will not be stored at home. NOTE: This sheet is a summary. It may not cover all possible information. If you have questions about this medicine, talk to your doctor, pharmacist, or health care provider.    2016, [employer] Standard. (2012-08-26 09:54:34)  Vincristine injection What is this medicine? VINCRISTINE (vin KRIS teen) is a chemotherapy drug. It slows the growth of cancer cells. This medicine is used to treat many types of cancer like Hodgkin's disease, leukemia, non-Hodgkin's lymphoma, neuroblastoma (brain cancer), rhabdomyosarcoma, and Wilms' tumor. This medicine may be used for other purposes; ask your health care provider or pharmacist if you have questions. What should I tell my health care provider before I take this medicine? They need to know if you have any of these conditions: -blood disorders -gout -infection (especially chickenpox,  cold sores, or herpes) -kidney disease -liver disease -lung disease -nervous system disease like Charcot-Marie-Tooth (CMT) -recent or ongoing radiation therapy -an unusual or allergic reaction to vincristine, other chemotherapy agents, other medicines, foods, dyes, or preservatives -pregnant or trying to get pregnant -breast-feeding How should I use this medicine? This drug is given as an infusion into a vein. It is administered in a hospital or clinic by a specially trained health care professional. If you have pain, swelling, burning, or any unusual feeling around the site of your injection, tell your health care professional right away. Talk to your pediatrician regarding the use of this medicine in children. While this drug may be prescribed for selected conditions, precautions do apply. Overdosage: If you think you have taken too much of this medicine contact a poison control center or emergency room at once. NOTE: This medicine is only for you. Do not share this medicine with others. What if I miss a dose? It is important not to miss your dose. Call your doctor or health care professional if you are unable to keep an appointment. What may interact with this medicine? Do not take this medicine with any of the following medications: -itraconazole -mibefradil -voriconazole This medicine may also interact with the following medications: -cyclosporine -erythromycin -fluconazole -ketoconazole -medicines for HIV like delavirdine, efavirenz, nevirapine -medicines for seizures like ethotoin, fosphenotoin, phenytoin -medicines to increase blood counts like filgrastim, pegfilgrastim, sargramostim -other chemotherapy drugs   like cisplatin, L-asparaginase, methotrexate, mitomycin, paclitaxel -pegaspargase -vaccines -zalcitabine, ddC Talk to your doctor or health care professional before taking any of these medicines: -acetaminophen -aspirin -ibuprofen -ketoprofen -naproxen This list  may not describe all possible interactions. Give your health care provider a list of all the medicines, herbs, non-prescription drugs, or dietary supplements you use. Also tell them if you smoke, drink alcohol, or use illegal drugs. Some items may interact with your medicine. What should I watch for while using this medicine? Your condition will be monitored carefully while you are receiving this medicine. You will need important blood work done while you are taking this medicine. This drug may make you feel generally unwell. This is not uncommon, as chemotherapy can affect healthy cells as well as cancer cells. Report any side effects. Continue your course of treatment even though you feel ill unless your doctor tells you to stop. In some cases, you may be given additional medicines to help with side effects. Follow all directions for their use. Call your doctor or health care professional for advice if you get a fever, chills or sore throat, or other symptoms of a cold or flu. Do not treat yourself. Avoid taking products that contain aspirin, acetaminophen, ibuprofen, naproxen, or ketoprofen unless instructed by your doctor. These medicines may hide a fever. Do not become pregnant while taking this medicine. Women should inform their doctor if they wish to become pregnant or think they might be pregnant. There is a potential for serious side effects to an unborn child. Talk to your health care professional or pharmacist for more information. Do not breast-feed an infant while taking this medicine. Men may have a lower sperm count while taking this medicine. Talk to your doctor if you plan to father a child. What side effects may I notice from receiving this medicine? Side effects that you should report to your doctor or health care professional as soon as possible: -allergic reactions like skin rash, itching or hives, swelling of the face, lips, or tongue -breathing problems -confusion or changes in  emotions or moods -constipation -cough -mouth sores -muscle weakness -nausea and vomiting -pain, swelling, redness or irritation at the injection site -pain, tingling, numbness in the hands or feet -problems with balance, talking, walking -seizures -stomach pain -trouble passing urine or change in the amount of urine Side effects that usually do not require medical attention (report to your doctor or health care professional if they continue or are bothersome): -diarrhea -hair loss -jaw pain -loss of appetite This list may not describe all possible side effects. Call your doctor for medical advice about side effects. You may report side effects to FDA at 1-800-FDA-1088. Where should I keep my medicine? This drug is given in a hospital or clinic and will not be stored at home. NOTE: This sheet is a summary. It may not cover all possible information. If you have questions about this medicine, talk to your doctor, pharmacist, or health care provider.    2016, [employer] Standard. (2008-01-26 17:17:13)  Cyclophosphamide injection What is this medicine? CYCLOPHOSPHAMIDE (sye kloe FOSS fa mide) is a chemotherapy drug. It slows the growth of cancer cells. This medicine is used to treat many types of cancer like lymphoma, myeloma, leukemia, breast cancer, and ovarian cancer, to name a few. This medicine may be used for other purposes; ask your health care provider or pharmacist if you have questions. What should I tell my health care provider before I take this medicine? They need to   know if you have any of these conditions: -blood disorders -history of other chemotherapy -infection -kidney disease -liver disease -recent or ongoing radiation therapy -tumors in the bone marrow -an unusual or allergic reaction to cyclophosphamide, other chemotherapy, other medicines, foods, dyes, or preservatives -pregnant or trying to get pregnant -breast-feeding How should I use this  medicine? This drug is usually given as an injection into a vein or muscle or by infusion into a vein. It is administered in a hospital or clinic by a specially trained health care professional. Talk to your pediatrician regarding the use of this medicine in children. Special care may be needed. Overdosage: If you think you have taken too much of this medicine contact a poison control center or emergency room at once. NOTE: This medicine is only for you. Do not share this medicine with others. What if I miss a dose? It is important not to miss your dose. Call your doctor or health care professional if you are unable to keep an appointment. What may interact with this medicine? This medicine may interact with the following medications: -amiodarone -amphotericin B -azathioprine -certain antiviral medicines for HIV or AIDS such as protease inhibitors (e.g., indinavir, ritonavir) and zidovudine -certain blood pressure medications such as benazepril, captopril, enalapril, fosinopril, lisinopril, moexipril, monopril, perindopril, quinapril, ramipril, trandolapril -certain cancer medications such as anthracyclines (e.g., daunorubicin, doxorubicin), busulfan, cytarabine, paclitaxel, pentostatin, tamoxifen, trastuzumab -certain diuretics such as chlorothiazide, chlorthalidone, hydrochlorothiazide, indapamide, metolazone -certain medicines that treat or prevent blood clots like warfarin -certain muscle relaxants such as succinylcholine -cyclosporine -etanercept -indomethacin -medicines to increase blood counts like filgrastim, pegfilgrastim, sargramostim -medicines used as general anesthesia -metronidazole -natalizumab This list may not describe all possible interactions. Give your health care provider a list of all the medicines, herbs, non-prescription drugs, or dietary supplements you use. Also tell them if you smoke, drink alcohol, or use illegal drugs. Some items may interact with your  medicine. What should I watch for while using this medicine? Visit your doctor for checks on your progress. This drug may make you feel generally unwell. This is not uncommon, as chemotherapy can affect healthy cells as well as cancer cells. Report any side effects. Continue your course of treatment even though you feel ill unless your doctor tells you to stop. Drink water or other fluids as directed. Urinate often, even at night. In some cases, you may be given additional medicines to help with side effects. Follow all directions for their use. Call your doctor or health care professional for advice if you get a fever, chills or sore throat, or other symptoms of a cold or flu. Do not treat yourself. This drug decreases your body's ability to fight infections. Try to avoid being around people who are sick. This medicine may increase your risk to bruise or bleed. Call your doctor or health care professional if you notice any unusual bleeding. Be careful brushing and flossing your teeth or using a toothpick because you may get an infection or bleed more easily. If you have any dental work done, tell your dentist you are receiving this medicine. You may get drowsy or dizzy. Do not drive, use machinery, or do anything that needs mental alertness until you know how this medicine affects you. Do not become pregnant while taking this medicine or for 1 year after stopping it. Women should inform their doctor if they wish to become pregnant or think they might be pregnant. Men should not father a child while taking this medicine and   for 4 months after stopping it. There is a potential for serious side effects to an unborn child. Talk to your health care professional or pharmacist for more information. Do not breast-feed an infant while taking this medicine. This medicine may interfere with the ability to have a child. This medicine has caused ovarian failure in some women. This medicine has caused reduced sperm  counts in some men. You should talk with your doctor or health care professional if you are concerned about your fertility. If you are going to have surgery, tell your doctor or health care professional that you have taken this medicine. What side effects may I notice from receiving this medicine? Side effects that you should report to your doctor or health care professional as soon as possible: -allergic reactions like skin rash, itching or hives, swelling of the face, lips, or tongue -low blood counts - this medicine may decrease the number of white blood cells, red blood cells and platelets. You may be at increased risk for infections and bleeding. -signs of infection - fever or chills, cough, sore throat, pain or difficulty passing urine -signs of decreased platelets or bleeding - bruising, pinpoint red spots on the skin, black, tarry stools, blood in the urine -signs of decreased red blood cells - unusually weak or tired, fainting spells, lightheadedness -breathing problems -dark urine -dizziness -palpitations -swelling of the ankles, feet, hands -trouble passing urine or change in the amount of urine -weight gain -yellowing of the eyes or skin Side effects that usually do not require medical attention (report to your doctor or health care professional if they continue or are bothersome): -changes in nail or skin color -hair loss -missed menstrual periods -mouth sores -nausea, vomiting This list may not describe all possible side effects. Call your doctor for medical advice about side effects. You may report side effects to FDA at 1-800-FDA-1088. Where should I keep my medicine? This drug is given in a hospital or clinic and will not be stored at home. NOTE: This sheet is a summary. It may not cover all possible information. If you have questions about this medicine, talk to your doctor, pharmacist, or health care provider.    2016, Elsevier/Gold Standard. (2012-03-14  16:22:58)    

## 2015-10-03 NOTE — Progress Notes (Signed)
Patient here today for labs and consultation with MD.  Patent had stress test prior to being seen in [facility].  HR 120  Patient complains of headache and neck pain 8/10.  Patient also nauseated.

## 2015-10-03 NOTE — Telephone Encounter (Signed)
Pt husband calling stating pt is not doing well this morning  States she had a lumbar puncture  procedure 09/28/15  But she is still struggling here. She had a Myoview scheduled for today and needs to be here by 930. Pt husband is worried she is going to have some issues.  Not sure if she can even make it and would like to know if she can take some medication for the headache she has right now.  Please advise.

## 2015-10-03 NOTE — Progress Notes (Signed)
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 CT- 1-2 CM LN subpectoral/medistinal/ retro-peritoneal/pelvic/ Right inguinal LN 1.6cm. PET- MULTIPLE LN/ Bone involvement  # Lumbar puncture- difficult-spinal headache.    HISTORY OF PRESENTING ILLNESS:  Stacy Murillo 60 y.o.  female history of large B-cell lymphoma/involving the peripheral blood- is here for headache is lumbar puncture.  Patient lumbar puncture was complicated by significant headache that needed emergency room visit. She was given Fiorcet; that has been making her dizzy.  Patient's inguinal lymph node excision biopsy is on hold for cardiac clearance for anesthesia. Patient had a stress test this morning.  No bleeding but complains of significant fatigue. No energy. Continues to have headaches especially sitting and standing. Improves on laying down. Positive for nausea and vomiting. Poor appetite.  ROS: A complete 10 point review of system is done which is negative except mentioned above in history of present illness.Marland Kitchen  MEDICAL HISTORY:  Past Medical History  Diagnosis Date  . Tachycardia   . Overweight(278.02)     Obesity  . Chest pain, unspecified   . Palpitations   . Osteoporosis   . Thrombocytopenia ([place])   . Lymphoma ([place]) 09/14/15    Monoclonal B cell lymphoma  . Diverticulosis 07/06/15  . Esophagitis 07/06/15  . Gastritis 07/06/15  .  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 GERD (gastroesophageal reflux disease)   . Fatigue   . Leg cramps   . Night sweats   . Cancer ([place])   . Dysrhythmia   . Shortness of breath dyspnea   . Arthritis   . Blood dyscrasia     SURGICAL HISTORY: Past Surgical History  Procedure Laterality Date  . Abdominal hysterectomy  2005  . Bone marrow biopsy  09/14/15  . Colonoscopy  07/05/2014  . Esophagogastroduodenoscopy  07/05/2014  . Portacath placement Right   . Peripheral vascular catheterization N/A 09/27/2015    Procedure: Glori Luis Cath Insertion;  Surgeon: Algernon Huxley, MD;  Location: [facility];  Service: Cardiovascular;  Laterality: N/A;  . Cholecystectomy  1997    SOCIAL HISTORY: Social History   Social History  . Marital Status: Married    Spouse Name: N/A  . Number of Children: N/A  . Years of Education: N/A   Occupational History  . Not on file.   Social History Main Topics  . Smoking status: Never Smoker   . Smokeless tobacco: Never Used  . Alcohol Use: No  . Drug Use: No  . Sexual Activity: Not on file   Other Topics Concern  . Not on file   Social History Narrative   Married   Does not get regular exercise    FAMILY HISTORY: Family History  Problem Relation Age of Onset  . Diabetes Neg Hx   . Heart disease Father     CABG x 4  . Hypertension Mother   . Pancreatic cancer Mother   . Ulcers Mother   . Asthma Mother     ALLERGIES:  has No Known Allergies.  MEDICATIONS:  Current Outpatient Prescriptions  Medication Sig Dispense Refill  . Alum Hydroxide-Mag Carbonate (GAVISCON PO) Take by mouth.    . butalbital-acetaminophen-caffeine (FIORICET) 50-325-40 MG tablet Take 1-2 tablets by mouth every 6 (six) hours as needed for headache. 20 tablet 0  . Cholecalciferol ([employer] VIT D 5000 HIGH-POTENCY PO) Take 1 tablet by mouth daily.     . diazepam (VALIUM) 5 MG tablet Take 1 tablet (5 mg total) by mouth every 8 (eight) hours as needed (vertigo). 20 tablet 0   . Probiotic Product (ALIGN PO) Take by mouth.    . ranitidine (ZANTAC) 150 MG tablet Take 150 mg by mouth 2 (two) times daily.      Current Facility-Administered Medications  Medication Dose Route Frequency Provider Last Rate Last Dose  . 0.9 %  sodium chloride infusion   Intravenous Continuous Cammie Sickle, MD      . ondansetron (ZOFRAN) 8 mg, dexamethasone (DECADRON) 20 mg in sodium chloride 0.9 % 50 mL IVPB   Intravenous Once Cammie Sickle, MD          .  PHYSICAL EXAMINATION:   Filed Vitals:   10/03/15 1232  BP: 122/76  Pulse: 120  Temp: 95.9 F (35.5 C)  Resp: 18   Filed Weights    GENERAL: Well-nourished well-developed; Alert, no distress and mild discomfortable.  Accompanied by her husband. Appears fatigued.  EYES: no pallor or icterus OROPHARYNX: no thrush or ulceration; good dentition  NECK: supple, no masses felt LYMPH:  no palpable lymphadenopathy in the cervical, axillary; 1 to 2 cm lymph node felt in the right inguinal region. LUNGS: clear to auscultation and  No wheeze or crackles HEART/CVS: regular rate & rhythm and no murmurs; No lower extremity edema ABDOMEN: abdomen soft, non-tender and normal bowel sounds; positive for spleen.  Musculoskeletal:no cyanosis of digits and no clubbing  PSYCH: alert & oriented x 3 with fluent speech NEURO: no focal motor/sensory deficits;  SKIN:  no rashes or significant lesions  LABORATORY DATA:  I have reviewed the data as listed Lab Results  Component Value Date   WBC 1.9* 09/30/2015   HGB 12.9 09/30/2015   HCT 39.2 09/30/2015   MCV 87.0 09/30/2015   PLT 53* 09/30/2015    Recent Labs  08/14/15 1709 08/24/15 1141 09/05/15 1137 09/30/15 0808  NA 134* 140 136 140  K 4.2 4.2 4.2 4.2  CL 99* 99 101 101  CO2 23 26 18* 22  GLUCOSE 81 85 83 97  BUN 15 17 17 16   CREATININE 0.94 0.90 0.89 0.82  CALCIUM 9.2 10.2 9.7 9.6  GFRNONAA >60  --  >60 >60  GFRAA >60  --  >60 >60  PROT  --  6.6 6.9 6.6   ALBUMIN  --  4.3 4.8 4.4  AST  --  42* 37 33  ALT  --  52* 36 30  ALKPHOS  --  111 98 99  BILITOT  --  0.6 0.7 0.6    RADIOGRAPHIC STUDIES: I have personally reviewed the radiological images as listed and agreed with the findings in the report. Ct Head Wo Contrast  09/30/2015  CLINICAL DATA:  Non-Hodgkin's lymphoma. Lumbar puncture 1 day prior. Headache. EXAM: CT HEAD WITHOUT CONTRAST TECHNIQUE: Contiguous axial images were obtained from the base of the skull through the vertex without intravenous contrast. COMPARISON:  None. FINDINGS: No acute intracranial hemorrhage. No focal mass lesion. No CT evidence of acute infarction. No midline shift or mass effect. No  hydrocephalus. Basilar cisterns are patent. Paranasal sinuses and mastoid air cells are clear. Orbits are clear. IMPRESSION: No acute intracranial findings.  Normal ventricular volume. Electronically Signed   By: Suzy Bouchard M.D.   On: 09/30/2015 09:31   Ct Chest W Contrast  09/09/2015  CLINICAL DATA:  Thrombocytopenia. Splenomegaly. Fatigue. History of cholecystectomy and hysterectomy. EXAM: CT CHEST, ABDOMEN, AND PELVIS WITH CONTRAST TECHNIQUE: Multidetector CT imaging of the chest, abdomen and pelvis was performed following the standard protocol during bolus administration of intravenous contrast. CONTRAST:  159m ISOVUE-300 IOPAMIDOL (ISOVUE-300) INJECTION 61% COMPARISON:  03/15/2004 CT abdomen/ pelvis. 05/23/2005 MRI abdomen. No prior chest CT. FINDINGS: CT CHEST Mediastinum/Nodes: Normal heart size. No significant pericardial fluid/thickening. Great vessels are normal in course and caliber. No central pulmonary emboli. Normal visualized thyroid. Normal esophagus. There a few mildly enlarged right axillary nodes, largest 1.0 cm (series 2/ image 11). There are mildly enlarged bilateral subpectoral lymph nodes, largest 1.1 cm on the right (series 2/ image 9) and 1.0 cm on the left (series 2/ image 9). No pathologically enlarged  mediastinal or hilar nodes. Lungs/Pleura: No pneumothorax. No pleural effusion. No acute consolidative airspace disease, significant pulmonary nodules or lung masses. Mild osteophyte related fibrosis in the medial right lower lobe. Musculoskeletal: No aggressive appearing focal osseous lesions. Mild degenerative changes in the thoracic spine. CT ABDOMEN AND PELVIS Hepatobiliary: Normal liver with no liver mass. Cholecystectomy. No biliary ductal dilatation. Pancreas: Normal, with no mass or duct dilation. Spleen: Moderate to marked splenomegaly (craniocaudal splenic length 18.0 cm) with approximate splenic volume 1312 cc (15.4 x 9.1 x 18.0 cm). No splenic mass. Adrenals/Urinary Tract: Normal adrenals. Normal kidneys with no hydronephrosis and no renal mass. Normal bladder. Stomach/Bowel: Grossly normal stomach. Normal caliber small bowel with no small bowel wall thickening. Normal appendix. Mild sigmoid diverticulosis, with no large bowel wall thickening or pericolonic fat stranding. Oral contrast progresses to the distal rectum. Vascular/Lymphatic: Atherosclerotic nonaneurysmal abdominal aorta. Patent portal, splenic, hepatic and renal veins. There are several mildly enlarged aortocaval and left para-aortic nodes, with the largest aortocaval node measuring 1.0 cm (series 2/image 60) in the largest left periaortic node measuring 1.0 cm (series 2/image 68). Mild bilateral common iliac lymphadenopathy, largest 1.3 cm on the right (series 2/image 82) and 1.2 cm on the left (series 2/image 76). Mild-to-moderate right external iliac lymphadenopathy, largest 1.7 cm (series 2/image 100). Mild right inguinal lymphadenopathy, largest 1.7 cm (series 2/image 113). Reproductive: Status post hysterectomy, with no abnormal findings at the vaginal cuff. No adnexal mass. Other: No pneumoperitoneum, ascites or focal fluid collection. Musculoskeletal: No aggressive appearing focal osseous lesions. Subcentimeter sclerotic lesions in  the right iliac bone and lumbar spine are nonspecific and probably benign bone islands. Mild degenerative changes in the lumbar spine. IMPRESSION: 1. Moderate to marked splenomegaly. Mild right axillary and mild bilateral subpectoral lymphadenopathy. Mild retroperitoneal and bilateral pelvic lymphadenopathy. These findings likely indicate a lymphoproliferative disorder. Tissue sampling is advised, with the most amenable targets likely the right inguinal, right external iliac or right axillary adenopathy. 2. Mild sigmoid diverticulosis. Electronically Signed   By: JIlona SorrelM.D.   On: 09/09/2015 08:51   Mr BJeri CosWCNContrast  09/26/2015  CLINICAL DATA:  Large B-cell lymphoma, recently diagnosed. EXAM: MRI HEAD WITHOUT AND WITH CONTRAST TECHNIQUE: Multiplanar, multiecho pulse sequences of the brain and surrounding structures were obtained without and with intravenous contrast. CONTRAST:  134mMULTIHANCE GADOBENATE DIMEGLUMINE 529 MG/ML IV SOLN COMPARISON:  None. FINDINGS: There is no  evidence of acute infarct, intracranial hemorrhage, mass, midline shift, or extra-axial fluid collection. Ventricles and sulci are normal. No significant cerebral white matter disease is seen for age. No abnormal brain parenchymal or meningeal enhancement is identified. Orbits are unremarkable. No significant paranasal sinus or mastoid inflammatory disease is seen. Major intracranial vascular flow voids are preserved. IMPRESSION: Unremarkable appearance of the brain for age. No evidence of intracranial lymphoma. Electronically Signed   By: Logan Bores M.D.   On: 09/26/2015 11:21   Ct Abdomen Pelvis W Contrast  09/09/2015  CLINICAL DATA:  Thrombocytopenia. Splenomegaly. Fatigue. History of cholecystectomy and hysterectomy. EXAM: CT CHEST, ABDOMEN, AND PELVIS WITH CONTRAST TECHNIQUE: Multidetector CT imaging of the chest, abdomen and pelvis was performed following the standard protocol during bolus administration of intravenous  contrast. CONTRAST:  125m ISOVUE-300 IOPAMIDOL (ISOVUE-300) INJECTION 61% COMPARISON:  03/15/2004 CT abdomen/ pelvis. 05/23/2005 MRI abdomen. No prior chest CT. FINDINGS: CT CHEST Mediastinum/Nodes: Normal heart size. No significant pericardial fluid/thickening. Great vessels are normal in course and caliber. No central pulmonary emboli. Normal visualized thyroid. Normal esophagus. There a few mildly enlarged right axillary nodes, largest 1.0 cm (series 2/ image 11). There are mildly enlarged bilateral subpectoral lymph nodes, largest 1.1 cm on the right (series 2/ image 9) and 1.0 cm on the left (series 2/ image 9). No pathologically enlarged mediastinal or hilar nodes. Lungs/Pleura: No pneumothorax. No pleural effusion. No acute consolidative airspace disease, significant pulmonary nodules or lung masses. Mild osteophyte related fibrosis in the medial right lower lobe. Musculoskeletal: No aggressive appearing focal osseous lesions. Mild degenerative changes in the thoracic spine. CT ABDOMEN AND PELVIS Hepatobiliary: Normal liver with no liver mass. Cholecystectomy. No biliary ductal dilatation. Pancreas: Normal, with no mass or duct dilation. Spleen: Moderate to marked splenomegaly (craniocaudal splenic length 18.0 cm) with approximate splenic volume 1312 cc (15.4 x 9.1 x 18.0 cm). No splenic mass. Adrenals/Urinary Tract: Normal adrenals. Normal kidneys with no hydronephrosis and no renal mass. Normal bladder. Stomach/Bowel: Grossly normal stomach. Normal caliber small bowel with no small bowel wall thickening. Normal appendix. Mild sigmoid diverticulosis, with no large bowel wall thickening or pericolonic fat stranding. Oral contrast progresses to the distal rectum. Vascular/Lymphatic: Atherosclerotic nonaneurysmal abdominal aorta. Patent portal, splenic, hepatic and renal veins. There are several mildly enlarged aortocaval and left para-aortic nodes, with the largest aortocaval node measuring 1.0 cm (series  2/image 60) in the largest left periaortic node measuring 1.0 cm (series 2/image 68). Mild bilateral common iliac lymphadenopathy, largest 1.3 cm on the right (series 2/image 82) and 1.2 cm on the left (series 2/image 76). Mild-to-moderate right external iliac lymphadenopathy, largest 1.7 cm (series 2/image 100). Mild right inguinal lymphadenopathy, largest 1.7 cm (series 2/image 113). Reproductive: Status post hysterectomy, with no abnormal findings at the vaginal cuff. No adnexal mass. Other: No pneumoperitoneum, ascites or focal fluid collection. Musculoskeletal: No aggressive appearing focal osseous lesions. Subcentimeter sclerotic lesions in the right iliac bone and lumbar spine are nonspecific and probably benign bone islands. Mild degenerative changes in the lumbar spine. IMPRESSION: 1. Moderate to marked splenomegaly. Mild right axillary and mild bilateral subpectoral lymphadenopathy. Mild retroperitoneal and bilateral pelvic lymphadenopathy. These findings likely indicate a lymphoproliferative disorder. Tissue sampling is advised, with the most amenable targets likely the right inguinal, right external iliac or right axillary adenopathy. 2. Mild sigmoid diverticulosis. Electronically Signed   By: JIlona SorrelM.D.   On: 09/09/2015 08:51   Nm Pet Image Initial (pi) Skull Base To Thigh  09/26/2015  CLINICAL DATA:  Initial treatment strategy for large B-cell lymphoma. EXAM: NUCLEAR MEDICINE PET SKULL BASE TO THIGH TECHNIQUE: 11.5 mCi F-18 FDG was injected intravenously. Full-ring PET imaging was performed from the skull base to thigh after the radiotracer. CT data was obtained and used for attenuation correction and anatomic localization. FASTING BLOOD GLUCOSE:  Value: 77 mg/dl COMPARISON:  CT chest abdomen pelvis 09/09/2015. FINDINGS: NECK There is mild asymmetric hypermetabolism in the right nasopharynx, which may be tonsillar in origin. Difficult to exclude lymphomatous involvement. Numerous  hypermetabolic lymph nodes are seen throughout the neck bilaterally. Index right level 5 lymph node measures 1.4 cm (CT image 43) with an SUV max of 14.0. CT images show no acute findings. CHEST Hypermetabolic supraclavicular, mediastinal, hilar, subpectoral and axillary adenopathy is seen. Index low right supraclavicular lymph node measures 12 mm (CT image 57) with an SUV max of 16.5. Right subpectoral lymph node measures 11 mm (CT image 69) with an SUV of 11.1. Subcarinal lymph node measures approximately 11 mm with an SUV max of 7.3. No hypermetabolic pulmonary nodules. CT images of show no pericardial or pleural effusion. ABDOMEN/PELVIS Spleen is enlarged and markedly hypermetabolic, with an SUV max of 21.2. There are hypermetabolic porta hepatis, abdominal peritoneal ligament, mesenteric and retroperitoneal lymph nodes. Index porta hepatis lymph node measures approximately 10 mm (CT image 135) with an SUV max of 6.0. Index aortocaval lymph node measures 9 mm (CT image 156) with an SUV max of 7.8. Hypermetabolic lymph nodes extend along the abdominal aorta, iliac chains and inguinal regions bilaterally. Index right external iliac lymph node measures 12 mm (CT image 206) with an SUV max of 12.3. No abnormal hypermetabolism in the liver, adrenal glands or pancreas. CT images show the liver to be grossly unremarkable. Cholecystectomy. Adrenal glands, kidneys, pancreas, stomach and bowel are grossly unremarkable. No free fluid. SKELETON There is diffuse osseous hypermetabolism. IMPRESSION: 1. Hypermetabolic adenopathy throughout the neck, chest, abdomen and pelvis with splenic enlargement/hypermetabolism and diffuse osseous hypermetabolism. Findings are consistent with the given diagnosis of lymphoma. 2. Mild hypermetabolism within asymmetric right nasopharyngeal soft tissue, which may be tonsillar in origin. Difficult to exclude lymphomatous involvement. Continued attention on followup exams is warranted.  Electronically Signed   By: Lorin Picket M.D.   On: 09/26/2015 11:07   Ct Biopsy  09/13/2015  CLINICAL DATA:  Thrombocytopenia. Bone marrow biopsy needed for further workup. EXAM: CT GUIDED ILIAC BONE MARROW BIOPSY ASPIRATE ANESTHESIA/SEDATION: Versed 2.0 mg IV, Fentanyl 75 mcg IV Total Moderate Sedation Time 10 minutes. The patient's level of consciousness and physiologic status were continuously monitored during the procedure by Radiology nursing. PROCEDURE: The procedure risks, benefits, and alternatives were explained to the patient. Questions regarding the procedure were encouraged and answered. The patient understands and consents to the procedure. A time-out was performed prior to the procedure. The right gluteal region was prepped with Betadine. Sterile gown and sterile gloves were used for the procedure. Local anesthesia was provided with 1% Lidocaine. Under CT guidance, an 11 gauge OnControl bone cutting needle was advanced from a posterior approach into the right iliac bone. Needle positioning was confirmed with CT. Initial non heparinized and heparinized aspirate samples were obtained of bone marrow. Core biopsy was performed via the OnControl needle. COMPLICATIONS: None FINDINGS: Inspection of initial aspirate did reveal visible particles. Intact core biopsy sample was obtained. IMPRESSION: CT guided bone marrow biopsy of right posterior iliac bone with both aspirate and core samples obtained. Electronically Signed   By: Eulas Post  Kathlene Cote M.D.   On: 09/13/2015 12:39   Mm Screening Breast Tomo Bilateral  09/20/2015  CLINICAL DATA:  Screening. EXAM: 2D DIGITAL SCREENING BILATERAL MAMMOGRAM WITH CAD AND ADJUNCT TOMO COMPARISON:  Previous exam(s). ACR Breast Density Category b: There are scattered areas of fibroglandular density. FINDINGS: There are no findings suspicious for malignancy. Images were processed with CAD. IMPRESSION: No mammographic evidence of malignancy. A result letter of this  screening mammogram will be mailed directly to the patient. RECOMMENDATION: Screening mammogram in one year. (Code:SM-B-01Y) BI-RADS CATEGORY  1: Negative. Electronically Signed   By: Dorise Bullion III M.D   On: 09/20/2015 17:46   Dg Nile Dear Of Needle/cath Tip For Spinal Inject Lt  09/28/2015  CLINICAL DATA:  Lymphoma EXAM: DIAGNOSTIC LUMBAR PUNCTURE UNDER FLUOROSCOPIC GUIDANCE FLUOROSCOPY TIME:  7 minutes and 48 seconds. PROCEDURE: Informed consent was obtained from the patient prior to the procedure, including potential complications of headache, allergy, and pain. With the patient prone, the lower back was prepped with Betadine. Initial attempts at lumbar puncture were performed by Dr. David Martinique. This was unsuccessful. Subsequently, I attempted lumbar puncture. The skin overlying the lumbar region was prepped and draped in a sterile fashion. 1% lidocaine was utilized for local anesthesia. Initially, a 20 gauge needle was advanced into the CSF space via left paramedian approach at L2-3. This yielded no fluid. Subsequently, needle placement at L2-3 on the right was performed. Two drops of serosanguineous fluid was obtained. FINDINGS: Image documents needle placement in the CSF space on the right at L2-3. COMPLICATIONS: None IMPRESSION: Lumbar puncture was performed. It was a very difficult procedure and only 2 drops serosanguineous fluid was obtained. Electronically Signed   By: Marybelle Killings M.D.   On: 09/28/2015 13:17    ASSESSMENT & PLAN:   # Large B-cell lymphoma- intravascular type [based on BMBx];invlving peripheral blood. Awaiting inguinal lymph node biopsy- given the rarity of the disease. PET scan shows- multiple lymph nodes above and below the diaphragm; also splenic involvement and also multiple bone involvement. I reviewed the images myself and also with the family/patient is here.  # Plan starting R CHOP chemotherapy May 25th; every 3 weeks 6 cycles. Discussed the potential side  effects including but not limited to-increasing fatigue, nausea vomiting, diarrhea, hair loss, sores in the mouth, increase risk of infection and also neuropathy.   # Post spinal headache- recommend IV fluids with Decadron 20 mg IV. Also recommend oxycodone; recommend taking caffeine on a frequent basis.  # If the stress test is okay- proceed with lymph node excisional biopsy on the 24th.   # Chemotherapy education tomorrow; check blood work hepatitis panel today. Sent prescription for EMLA cream; nausea medication; prednisone.    # 40 minutes face-to-face with the patient discussing the above plan of care; more than 50% of time spent on natural history; counseling and coordination.    Cammie Sickle, MD 10/03/2015 12:59 PM

## 2015-10-03 NOTE — Telephone Encounter (Signed)
S/w husband, Stacy Murillo, who reports pt sleeping at this time.  She had myoview today then proceeded to cancer center where she was given IV pain meds for headache.  Prescriptions given for nausea and pain as well as steroids. Spouse states he picked up prescriptions and pt took pain meds and is now sleeping.  He inquires when they will be notified of myoview results as she is having procedure Wednesday.  Informed spouse that we will call as soon as MD reads study and I will make MD aware of urgency.  He verbalized understanding and is appreciative of the call.

## 2015-10-04 ENCOUNTER — Inpatient Hospital Stay: Payer: BLUE CROSS/BLUE SHIELD

## 2015-10-04 ENCOUNTER — Telehealth: Payer: Self-pay

## 2015-10-04 LAB — HEPATITIS B CORE ANTIBODY, IGM: HEP B C IGM: NEGATIVE

## 2015-10-04 LAB — HEPATITIS B SURFACE ANTIGEN: Hepatitis B Surface Ag: NEGATIVE

## 2015-10-04 NOTE — Telephone Encounter (Signed)
Patient notified that we received her clearance after her stress test completed on 10/03/15. Patient is to arrive on 10/04/15 at [facility] and report to Same Day Surgery at 12:30 pm for her Platelets and transfusion. Her surgery is scheduled at [facility] on 10/04/15 at 2:30 pm. Patient is aware of date, times, and instructions.

## 2015-10-04 NOTE — Pre-Procedure Instructions (Signed)
CARDIAC CLEARANCE NOTE IN EPIC FROM DR INGAL-STRESS TEST WAS NORMAL ON 10-03-15 AND IS LOW RISK PER DR Yvone Neu

## 2015-10-04 NOTE — Pre-Procedure Instructions (Signed)
Show images for NM Myocar Multi W/Spect W/Wall Motion / EF     Study Result      T wave inversion was noted during stress in the II and III leads, beginning at 1 minutes of stress, ending at 2 minutes of stress.  There was no ST segment deviation noted during stress.  The study is normal.  This is a low risk study.  The left ventricular ejection fraction is normal (55-65%).      Scans on Order [id number]     Scan on 10/03/2015 3:53 PM by Provider Default, MDScan on 10/03/2015 3:53 PM by Provider Default, MD     Result History     NM Myocar Multi W/Spect W/Wall Motion / EF (Order [id number]) on 10/03/2015 - Order Result History Report     Result Notes     Notes Recorded by [name], MD on 10/03/2015 at 8:26 PM No ischemia on this study. LV EF normal. Normal stress test portends low risk of clinically significant CAD.  Pt is low cardiac risk for planned inguinal hernia biopsy. No indication for further testing at this point.            Vitals     Height Weight BMI (Calculated)    5\' 2"  (1.575 m) 166 lb (75.297 kg) 30.4      Nuclear Stress Findings     Isotope administration Rest isotope was administered with an IV injection of 13.45 mCi technetium tetrofosmin. Rest SPECT images were obtained approximately 45 minutes post tracer injection. Stress isotope was administered with an IV injection of 31.16 mCi technetium tetrofosmin Stress SPECT images were obtained approximately 30 minutes post tracer injection.    Nuclear Study Quality Overall image quality is excellent.There is no nuclear artifact present.    Nuclear Measurements Study was gated.    Rest Perfusion Rest perfusion normal.    Stress Perfusion Stress perfusion normal.    Overall Study Impression Myocardial perfusion is normal. The study is normal. This is a low risk study. Overall left ventricular systolic function was normal. LV cavity size is normal. The left ventricular  ejection fraction is normal (55-65%).   From: ACCF/SCAI/STS/AATS/AHA/ASNC/HFSA/SCCT 2012 Appropriate Use Criteria for Coronary Revascularization Focused Update      Nuclear Stress Measurements     LV sys vol 39 mL     TID 1.25      LV dias vol 90 mL     SSS 0      SRS 0      SDS 0              Stress Measurements     Baseline Vitals  Rest HR 104 bpm     Rest BP 113/67 mmHg     Peak Stress Vitals  Peak HR 130 bpm     Peak BP 114/51 mmHg     Exercise Data  Percent HR 81 %          Stress Findings     ECG Baseline ECG indicates sinus tachycardia. .    Stress Findings A pharmacological stress test was performed using IV Lexiscan 0.4mg  over 10 seconds performed without concurrent submaximal exercise.  The patient reported shortness of breath and flushing during the stress test.    Response to Stress There was no ST segment deviation noted during stress.  T wave inversion was noted during stress in the II and III leads, beginning at 1 minutes of stress, ending at 2 minutes of stress. Arrhythmias during  stress: none.  Arrhythmias during recovery: none.  There were no significant arrhythmias noted during the test.  ECG was interpretable and there was no significant change from baseline.      Wall Scoring     Score Index: 1.000 Percent Normal: 100.0%         The left ventricular wall motion is normal.               External Result Report     External Result Report     Imaging     Imaging Information     Signed by     Signed Date/Time   Phone Pager    Kathlyn Sacramento A 10/03/2015 1:06 PM [id number]       Exam Information     Status Exam Begun   Exam Ended      Final [99] 10/03/2015 9:45 AM 10/03/2015 12:32 PM        Result Notes     Notes Recorded by [name], MD on 10/03/2015 at 8:26 PM No ischemia on this study. LV EF normal. Normal stress test portends low risk of clinically  significant CAD.  Pt is low cardiac risk for planned inguinal hernia biopsy. No indication for further testing at this point.       Signed     Electronically signed by [name], MD on 10/03/15 at 1306 EDT     Imaging Related Medications     Medication    technetium tetrofosmin (TC-MYOVIEW) injection 30 milli Curie    Route: Intravenous    Admin Amount: 30 milli Curie    PRN Reason(s): radiopharmaceutical    Last Admin Time: 10/03/15 1056    Number of Expected Doses: 1      Most Recent Administration:    User Action Time Recorded Time Dose Route Site Comment Action Reason    Sibyl Parr, Rad Tech 10/03/15 1056 10/03/15 1101 31.16 milli Curie Intravenous   Contrast Given     Full Administration Report                  Medication    technetium tetrofosmin (TC-MYOVIEW) injection 13 milli Curie    Route: Intravenous    Admin Amount: 13 milli Curie    PRN Reason(s): radiopharmaceutical    Last Admin Time: 10/03/15 [id number]    Number of Expected Doses: 1      Most Recent Administration:    User Action Time Recorded Time Dose Route Site Comment Action Reason    Augustine Radar 10/03/15 [id number] 10/03/15 [id number] 13.45 milli Curie Intravenous   Contrast Given     Full Administration Report                  Original Order     Ordered On Ordered By      09/14/2015 9:15 AM Valora Corporal, RN             PACS Images     Show images for NM Myocar Multi W/Spect W/Wall Motion / EF    NM Myocar Multi W/Spect W/Wall Motion / EF (Order [id number])  Imaging  Order: [id number]   Released By: Seymour Bars  Authorizing: [name], MD   Date: 10/03/2015  Department: [facility] NUCLEAR MEDICINE       Order Information     Order Date/Time Release Date/Time Start Date/Time End Date/Time    10/03/15 09:12 AM 10/03/15 09:12 AM 10/03/15 09:12 AM 10/03/2015  Order  Details     Frequency Duration Priority Order Class    As needed 1 occurrence Routine Ancillary Performed      Order Questions     Question Answer Comment    Study with Lexiscan     Reason for Exam (SYMPTOM OR DIAGNOSIS REQUIRED) chest pain     Note:  Appropriate reason for exam on pre-op X-rays should indicate the reason for the surgery. Examples include: Coronary Artery disease, osteoartiritis of the knee, brain tumor, etc.    Preferred imaging location? [place]     If indicated for the ordered procedure, I authorize the administration of a radiopharmaceutical per Radiology protocol Yes       Order History  Inpatient    Date/Time Action Taken User Additional Information    0000 Result Augustine Radar In process    10/03/15 [id number] Release Jonne Ply Goins [id number]    10/03/15 1110 Result Ryan Lyn Hollingshead, PA-C In process    10/03/15 1233 Result Jody Edrick Oh, Rad Tech In process    10/03/15 1304 Result [name], MD Final      Verbal Order Info     Action Created on Order Mode Entered by Responsible Provider Signed by Signed on    Ordering 09/14/15 0915 Verbal with Read Back: Cosign Required Valora Corporal, RN [name], MD [name], MD 09/14/15 1101      Imaging CC Recipients       Associated Diagnoses       ICD-9-CM ICD-10-CM    Chest tightness    786.59 R07.89    SOB (shortness of breath) on exertion    786.05 R06.02    Palpitation    785.1 R00.2      Collection Information     Resulting Agency: [facility]       Reviewed by List     [name], MD on 10/03/2015 8:26 PM     Order Provider Info       Office phone Pager/beeper E-mail    Ordering User Valora Corporal, RN -- -- [email or web address]    Authorizing Provider [name], MD [phone] -- --    Billing Provider [name], MD [phone] -- [email or web address]       Order-Level Documents:       Scan on 10/03/2015 3:53 PM by Provider Default, MDScan on 10/03/2015 3:53 PM by Provider Default, MD     Reprint Requisition     NM Myocar Multi W/Spect W/Wall Motion / EF (Order [id number]) on 10/03/15     Original Order     Ordered On Ordered By      09/14/2015 9:15 AM Valora Corporal, RN

## 2015-10-05 ENCOUNTER — Ambulatory Visit (INDEPENDENT_AMBULATORY_CARE_PROVIDER_SITE_OTHER): Payer: BLUE CROSS/BLUE SHIELD

## 2015-10-05 ENCOUNTER — Other Ambulatory Visit: Payer: Self-pay

## 2015-10-05 ENCOUNTER — Ambulatory Visit: Payer: BLUE CROSS/BLUE SHIELD | Admitting: Certified Registered Nurse Anesthetist

## 2015-10-05 ENCOUNTER — Ambulatory Visit
Admission: RE | Admit: 2015-10-05 | Discharge: 2015-10-05 | Disposition: A | Discharge status: Home / Self Care | Payer: BLUE CROSS/BLUE SHIELD | Source: Ambulatory Visit | Attending: General Surgery | Admitting: General Surgery

## 2015-10-05 ENCOUNTER — Encounter
Admission: RE | Disposition: A | Discharge status: Home / Self Care | Payer: Self-pay | Source: Ambulatory Visit | Attending: General Surgery

## 2015-10-05 ENCOUNTER — Ambulatory Visit: Payer: BLUE CROSS/BLUE SHIELD | Admitting: Cardiology

## 2015-10-05 ENCOUNTER — Encounter: Payer: Self-pay | Admitting: *Deleted

## 2015-10-05 DIAGNOSIS — R0789 Other chest pain: Secondary | ICD-10-CM

## 2015-10-05 DIAGNOSIS — C8335 Diffuse large B-cell lymphoma, lymph nodes of inguinal region and lower limb: Secondary | ICD-10-CM | POA: Diagnosis not present

## 2015-10-05 DIAGNOSIS — Z79899 Other long term (current) drug therapy: Secondary | ICD-10-CM | POA: Diagnosis not present

## 2015-10-05 DIAGNOSIS — C8590 Non-Hodgkin lymphoma, unspecified, unspecified site: Secondary | ICD-10-CM

## 2015-10-05 DIAGNOSIS — R002 Palpitations: Secondary | ICD-10-CM

## 2015-10-05 DIAGNOSIS — R0602 Shortness of breath: Secondary | ICD-10-CM

## 2015-10-05 DIAGNOSIS — D696 Thrombocytopenia, unspecified: Secondary | ICD-10-CM | POA: Diagnosis not present

## 2015-10-05 DIAGNOSIS — C8521 Mediastinal (thymic) large B-cell lymphoma, lymph nodes of head, face, and neck: Secondary | ICD-10-CM | POA: Diagnosis not present

## 2015-10-05 DIAGNOSIS — R590 Localized enlarged lymph nodes: Secondary | ICD-10-CM | POA: Diagnosis present

## 2015-10-05 DIAGNOSIS — K219 Gastro-esophageal reflux disease without esophagitis: Secondary | ICD-10-CM | POA: Diagnosis not present

## 2015-10-05 HISTORY — DX: Unspecified osteoarthritis, unspecified site: M19.90

## 2015-10-05 HISTORY — DX: Reserved for inherently not codable concepts without codable children: IMO0001

## 2015-10-05 HISTORY — PX: INGUINAL LYMPH NODE BIOPSY: SHX5865

## 2015-10-05 HISTORY — DX: Disease of blood and blood-forming organs, unspecified: D75.9

## 2015-10-05 HISTORY — DX: Cardiac arrhythmia, unspecified: I49.9

## 2015-10-05 LAB — PLATELET COUNT: Platelets: 66 10*3/uL — ABNORMAL LOW (ref 150–440)

## 2015-10-05 SURGERY — BIOPSY, LYMPH NODE, INGUINAL, OPEN
Anesthesia: General | Laterality: Right | Wound class: Clean

## 2015-10-05 MED ORDER — SODIUM CHLORIDE 0.9% FLUSH: 10.0000 mL | INTRAVENOUS | Status: DC | PRN

## 2015-10-05 MED ORDER — MIDAZOLAM HCL 2 MG/2ML IJ SOLN
INTRAMUSCULAR | Status: DC | PRN
  Administered 2015-10-05: 2 mg via INTRAVENOUS

## 2015-10-05 MED ORDER — LIDOCAINE HCL 1 % IJ SOLN
INTRAMUSCULAR | Status: DC | PRN
  Administered 2015-10-05: 13 mL

## 2015-10-05 MED ORDER — PROPOFOL 500 MG/50ML IV EMUL
INTRAVENOUS | Status: DC | PRN
  Administered 2015-10-05: 75 ug/kg/min via INTRAVENOUS

## 2015-10-05 MED ORDER — HEPARIN SOD (PORK) LOCK FLUSH 100 UNIT/ML IV SOLN: 250.0000 [IU] | INTRAVENOUS | Status: DC | PRN

## 2015-10-05 MED ORDER — ONDANSETRON HCL 4 MG/2ML IJ SOLN: 4.0000 mg | Freq: Once | INTRAMUSCULAR | Status: DC | PRN

## 2015-10-05 MED ORDER — SODIUM CHLORIDE 0.9 % IV SOLN: 250.0000 mL | Freq: Once | INTRAVENOUS | Status: DC

## 2015-10-05 MED ORDER — SODIUM CHLORIDE 0.9% FLUSH: 3.0000 mL | INTRAVENOUS | Status: DC | PRN

## 2015-10-05 MED ORDER — BUPIVACAINE HCL 0.5 % IJ SOLN
INTRAMUSCULAR | Status: DC | PRN
  Administered 2015-10-05: 13 mL

## 2015-10-05 MED ORDER — FENTANYL CITRATE (PF) 100 MCG/2ML IJ SOLN
INTRAMUSCULAR | Status: DC | PRN
  Administered 2015-10-05 (×2): 50 ug via INTRAVENOUS

## 2015-10-05 MED ORDER — LACTATED RINGERS IV SOLN
INTRAVENOUS | Status: DC
  Administered 2015-10-05: 14:00:00 via INTRAVENOUS

## 2015-10-05 MED ORDER — PROPOFOL 10 MG/ML IV BOLUS
INTRAVENOUS | Status: DC | PRN
  Administered 2015-10-05: 20 mg via INTRAVENOUS

## 2015-10-05 MED ORDER — LIDOCAINE HCL (CARDIAC) 20 MG/ML IV SOLN
INTRAVENOUS | Status: DC | PRN
Start: 2015-10-05 — End: 2015-10-05
  Administered 2015-10-05: 50 mg via INTRAVENOUS

## 2015-10-05 MED ORDER — FENTANYL CITRATE (PF) 100 MCG/2ML IJ SOLN: 25.0000 ug | INTRAMUSCULAR | Status: DC | PRN

## 2015-10-05 MED ORDER — HEPARIN SOD (PORK) LOCK FLUSH 100 UNIT/ML IV SOLN: 500.0000 [IU] | Freq: Every day | INTRAVENOUS | Status: DC | PRN

## 2015-10-05 MED ORDER — BUPIVACAINE HCL (PF) 0.5 % IJ SOLN
INTRAMUSCULAR | Status: AC
  Filled 2015-10-05: qty 30

## 2015-10-05 MED ORDER — CHLORHEXIDINE GLUCONATE 4 % EX LIQD
1.0000 | Freq: Once | CUTANEOUS | Status: DC
Start: 2015-10-06 — End: 2015-10-05

## 2015-10-05 MED ORDER — LIDOCAINE HCL (PF) 1 % IJ SOLN
INTRAMUSCULAR | Status: AC
  Filled 2015-10-05: qty 30

## 2015-10-05 MED ORDER — SODIUM CHLORIDE FLUSH 0.9 % IV SOLN
INTRAVENOUS | Status: AC
  Filled 2015-10-05: qty 10

## 2015-10-05 SURGICAL SUPPLY — 19 items
CANISTER SUCT 1200ML W/VALVE (MISCELLANEOUS) ×2 IMPLANT
CHLORAPREP W/TINT 26ML (MISCELLANEOUS) ×2 IMPLANT
DRAPE LAPAROTOMY 100X77 ABD (DRAPES) ×2 IMPLANT
ELECT REM PT RETURN 9FT ADLT (ELECTROSURGICAL) ×2
ELECTRODE REM PT RTRN 9FT ADLT (ELECTROSURGICAL) ×1 IMPLANT
GOWN STRL REUS W/ TWL LRG LVL3 (GOWN DISPOSABLE) ×2 IMPLANT
GOWN STRL REUS W/TWL LRG LVL3 (GOWN DISPOSABLE) ×2
KIT RM TURNOVER STRD PROC AR (KITS) ×2 IMPLANT
LABEL OR SOLS (LABEL) IMPLANT
LIQUID BAND (GAUZE/BANDAGES/DRESSINGS) ×2 IMPLANT
NEEDLE HYPO 25X1 1.5 SAFETY (NEEDLE) ×2 IMPLANT
NS IRRIG 500ML POUR BTL (IV SOLUTION) ×2 IMPLANT
PACK BASIN MINOR ARMC (MISCELLANEOUS) ×2 IMPLANT
SUT ETHILON 5-0 FS-2 18 BLK (SUTURE) IMPLANT
SUT MNCRL 3 0 RB1 (SUTURE) ×1 IMPLANT
SUT MONOCRYL 3 0 RB1 (SUTURE) ×1
SUT SILK 4 0 (SUTURE)
SUT SILK 4-0 18XBRD TIE 12 (SUTURE) IMPLANT
SUT VIC AB 0 CT2 27 (SUTURE) ×2 IMPLANT

## 2015-10-05 NOTE — Op Note (Signed)
Preop diagnosis: Lymphoma  Post op diagnosis: Same  Operation: Excision of right inguinal lymph nodes for diagnostic purpose. Intraoperative ultrasound guidance  Surgeon: Mckinley Jewel  Assistant:     Anesthesia: Local with sedation. 26 mL of 0.5% Marcaine mixed with 1% Xylocaine was used  Complications: None  EBL: Minimal  Drains: None  Description: Patient was placed supine on the operating table and with adequate sedation and monitoring the right groin area was prepped and draped sterile field.Marland Kitchen Ultrasound probe was brought up and the inguinal region was carried with the ultrasound and several lymph nodes in lying in the deep superficial plane was identified overlying the major vessels. This area and the skin was marked and this was located about the centimeter and a half above the groin crease. Local anesthetic was then instilled to obtain an adequate block. Skin incision was then made and deepened through the layers into the deep subcutaneous plane whereby careful dissection to 1-1/2 cm sized to rounded firm lymph nodes were identified. These was circumferentially freed with cautery and removed. And sent to pathology. Pathologist felt there was a   adequate tissue for diagnostic processing. Hemostasis obtained with cautery and ligatures of 3-0 Vicryl. Deeper layers closed with interrupted  3-0 Vicryl. Skin closed with subcuticular 3-0 Monocryl. Norlina  ban applied and patient subsequently returned recovery room stable condition.

## 2015-10-05 NOTE — OR Nursing (Signed)
Patient sitting quietly in nad; platelets infused per policy; no reaction noted; patient to OR via stretcher after platelet infusion completed;

## 2015-10-05 NOTE — Anesthesia Preprocedure Evaluation (Addendum)
Anesthesia Evaluation  Patient identified by MRN, date of birth, ID band Patient awake    Reviewed: Allergy & Precautions, NPO status , Patient's Chart, lab work & pertinent test results  Airway Mallampati: II       Dental  (+) Teeth Intact   Pulmonary shortness of breath,    breath sounds clear to auscultation       Cardiovascular Exercise Tolerance: Good  Rhythm:Regular Rate:Normal     Neuro/Psych negative neurological ROS  negative psych ROS   GI/Hepatic Neg liver ROS, GERD  Medicated,  Endo/Other  negative endocrine ROS  Renal/GU negative Renal ROS     Musculoskeletal   Abdominal Normal abdominal exam  (+)   Peds  Hematology Thrombocytopenia   Anesthesia Other Findings   Reproductive/Obstetrics                            Anesthesia Physical Anesthesia Plan  ASA: II  Anesthesia Plan: MAC   Post-op Pain Management:    Induction: Intravenous  Airway Management Planned: Natural Airway and Nasal Cannula  Additional Equipment:   Intra-op Plan:   Post-operative Plan:   Informed Consent: I have reviewed the patients History and Physical, chart, labs and discussed the procedure including the risks, benefits and alternatives for the proposed anesthesia with the patient or authorized representative who has indicated his/her understanding and acceptance.     Plan Discussed with: CRNA  Anesthesia Plan Comments:         Anesthesia Quick Evaluation

## 2015-10-05 NOTE — H&P (View-Only) (Signed)
Patient ID: Stacy Murillo, female   DOB: 11/30/1955, 60 y.o.   MRN: [id number]  Chief Complaint  Patient presents with  . Other    inguinal lymphnode     HPI Stacy Murillo is a 60 y.o. female here today for a evaluation of a inguinal lymph node. I have reviewed the history of present illness with the patient.   This pt recently was found to be thrombocytopenic. Work up led to finding of splenomegaly, generalised lymphadenopathy. Bone marrow showed a very rae type of lymphoma. There is concern as to the actual type and a soft tissue biopsy is being requested. Most easily accessible is the right inguinal area, PET pos nodes   HPI  Past Medical History  Diagnosis Date  . Tachycardia   . Overweight(278.02)     Obesity  . Chest pain, unspecified   . Palpitations   . Osteoporosis   . Thrombocytopenia (HCC)   . 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 Diverticulosis 07/06/15  . Esophagitis 07/06/15  . Gastritis 07/06/15  . 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 GERD (gastroesophageal reflux disease)   . Fatigue   . Leg cramps   . Night sweats   . Cancer (HCC)     Past Surgical History  Procedure Laterality Date  . Abdominal hysterectomy  2005  . Bone marrow biopsy  09/14/15  . Colonoscopy  07/05/2014  . Esophagogastroduodenoscopy  07/05/2014  . Portacath placement Right   . Peripheral vascular catheterization N/A 09/27/2015    Procedure: Porta Cath Insertion;  Surgeon: Jason S Dew, MD;  Location: [facility] INVASIVE CV LAB;  Service: Cardiovascular;  Laterality: N/A;  . Cholecystectomy  1997    Family History  Problem Relation Age of Onset  . Diabetes Neg Hx   . Heart disease Father     CABG x 4  . Hypertension Mother   . Pancreatic cancer Mother   . Ulcers Mother   . Asthma Mother     Social History Social History  Substance Use Topics  . Smoking status: Never Smoker   . Smokeless tobacco: Never Used  . Alcohol Use: No    No Known Allergies  Current  Outpatient Prescriptions  Medication Sig Dispense Refill  . Alum Hydroxide-Mag Carbonate (GAVISCON PO) Take by mouth.    . Calcium Carbonate-Vit D-Min (CALCIUM 1200 PO) Take by mouth 2 (two) times daily.    . Cholecalciferol ([employer]S VIT D 5000 HIGH-POTENCY PO) Take by mouth.    . Probiotic Product (ALIGN PO) Take by mouth.    . ranitidine (ZANTAC) 150 MG tablet Take by mouth.     No current facility-administered medications for this visit.   Facility-Administered Medications Ordered in Other Visits  Medication Dose Route Frequency Provider Last Rate Last Dose  . heparin lock flush 100 unit/mL  500 Units Intracatheter Daily PRN Govinda R Brahmanday, MD      . heparin lock flush 100 unit/mL  250 Units Intracatheter PRN Govinda R Brahmanday, MD      . sodium chloride flush (NS) 0.9 % injection 10 mL  10 mL Intracatheter PRN Govinda R Brahmanday, MD      . sodium chloride flush (NS) 0.9 % injection 3 mL  3 mL Intracatheter PRN Govinda R Brahmanday, MD      . technetium sestamibi (CARDIOLITE) injection 12 milli Curie  12 milli Curie Intravenous Once PRN Thomas Register Jr., MD   11.5 milli Curie at 09/26/15 0843      Review of Systems Review of Systems  Constitutional: Negative.   Respiratory: Negative.   Cardiovascular: Negative.     Blood pressure 130/70, pulse 74, resp. rate 12, height 5' 2" (1.575 m), weight 166 lb (75.297 kg).  Physical Exam Physical Exam  Constitutional: She is oriented to person, place, and time. She appears well-developed and well-nourished.  Eyes: Conjunctivae are normal. No scleral icterus.  Neck: Neck supple.  Cardiovascular: Normal rate, regular rhythm and normal heart sounds.   Pulmonary/Chest: Effort normal and breath sounds normal.  Abdominal: Soft. Normal appearance and bowel sounds are normal. There is splenomegaly. There is no tenderness.    Lymphadenopathy:    She has no cervical adenopathy.    She has no axillary adenopathy.       Right: Inguinal  (one palpable node) adenopathy present.       Left: Supraclavicular (1cm, firm, deep seated) adenopathy present. No inguinal adenopathy present.  Neurological: She is alert and oriented to person, place, and time.  Skin: Skin is warm.    Data Reviewed PET scan, recent notes, labs  Assessment    Lymphoma, type uncertain at present. As requested by oncologist, plan for excisional biopsy of right inguinal node. Procedure explained to pt and she is agreeable.     Plan    Patient's surgery has been scheduled for 10-03-15 at [facility].    PCP:  Walker, Jennifer This information has been scribed by Jessica Qualls CMA.    Khamarion Bjelland G 09/28/2015, 3:49 PM    

## 2015-10-05 NOTE — Anesthesia Procedure Notes (Signed)
Date/Time: 10/05/2015 2:05 PM Performed by: Johnna Acosta Pre-anesthesia Checklist: Patient identified, Emergency Drugs available, Suction available, Patient being monitored and Timeout performed Patient Re-evaluated:Patient Re-evaluated prior to inductionOxygen Delivery Method: Simple face mask

## 2015-10-05 NOTE — Interval H&P Note (Signed)
History and Physical Interval Note:  10/05/2015 1:24 PM  After initial eval pt had a stress test and was felt to be low tisk for anesthesia,.  Stacy Murillo  has presented today for surgery, with the diagnosis of LYMPHOMA  The various methods of treatment have been discussed with the patient and family. After consideration of risks, benefits and other options for treatment, the patient has consented to  Procedure(s): INGUINAL LYMPH NODE BIOPSY (N/A) as a surgical intervention .  The patient's history has been reviewed, patient examined, no change in status, stable for surgery.  I have reviewed the patient's chart and labs.  Questions were answered to the patient's satisfaction.     [name] G

## 2015-10-05 NOTE — Discharge Instructions (Signed)
AMBULATORY SURGERY  °DISCHARGE INSTRUCTIONS ° ° °1) The drugs that you were given will stay in your system until tomorrow so for the next 24 hours you should not: ° °A) Drive an automobile °B) Make any legal decisions °C) Drink any alcoholic beverage ° ° °2) You may resume regular meals tomorrow.  Today it is better to start with liquids and gradually work up to solid foods. ° °You may eat anything you prefer, but it is better to start with liquids, then soup and crackers, and gradually work up to solid foods. ° ° °3) Please notify your doctor immediately if you have any unusual bleeding, trouble breathing, redness and pain at the surgery site, drainage, fever, or pain not relieved by medication. ° ° ° °4) Additional Instructions: ° ° ° ° ° ° ° °Please contact your physician with any problems or Same Day Surgery at [phone], Monday through Friday 6 am to 4 pm, or [place] [facility] [place] Main number at [phone]. °

## 2015-10-05 NOTE — Transfer of Care (Signed)
Immediate Anesthesia Transfer of Care Note  Patient: Stacy Murillo  Procedure(s) Performed: Procedure(s): INGUINAL LYMPH NODE BIOPSY (Right)  Patient Location: PACU  Anesthesia Type:General  Level of Consciousness: awake, alert  and oriented  Airway & Oxygen Therapy: Patient Spontanous Breathing  Post-op Assessment: Report given to RN  Post vital signs: Reviewed and stable  Last Vitals:  Filed Vitals:   10/05/15 1403 10/05/15 1450  BP: 135/67 109/67  Pulse: 97 91  Temp: 36.9 C 37.5 C  Resp: 16 16    Last Pain: There were no vitals filed for this visit.       Complications: No apparent anesthesia complications

## 2015-10-06 ENCOUNTER — Encounter (HOSPITAL_COMMUNITY): Payer: Self-pay

## 2015-10-06 ENCOUNTER — Inpatient Hospital Stay: Payer: BLUE CROSS/BLUE SHIELD

## 2015-10-06 ENCOUNTER — Inpatient Hospital Stay (HOSPITAL_BASED_OUTPATIENT_CLINIC_OR_DEPARTMENT_OTHER): Payer: BLUE CROSS/BLUE SHIELD | Admitting: Internal Medicine

## 2015-10-06 ENCOUNTER — Other Ambulatory Visit: Payer: BLUE CROSS/BLUE SHIELD

## 2015-10-06 ENCOUNTER — Encounter: Payer: Self-pay | Admitting: General Surgery

## 2015-10-06 ENCOUNTER — Other Ambulatory Visit: Payer: Self-pay | Admitting: Internal Medicine

## 2015-10-06 VITALS — BP 124/66 | HR 102 | Resp 18

## 2015-10-06 VITALS — BP 128/81 | HR 81 | Temp 98.8°F | Ht 62.0 in | Wt 160.3 lb

## 2015-10-06 DIAGNOSIS — C8521 Mediastinal (thymic) large B-cell lymphoma, lymph nodes of head, face, and neck: Secondary | ICD-10-CM

## 2015-10-06 DIAGNOSIS — G971 Other reaction to spinal and lumbar puncture: Secondary | ICD-10-CM | POA: Diagnosis not present

## 2015-10-06 DIAGNOSIS — C8583 Other specified types of non-Hodgkin lymphoma, intra-abdominal lymph nodes: Secondary | ICD-10-CM

## 2015-10-06 DIAGNOSIS — D696 Thrombocytopenia, unspecified: Secondary | ICD-10-CM | POA: Diagnosis not present

## 2015-10-06 DIAGNOSIS — R002 Palpitations: Secondary | ICD-10-CM

## 2015-10-06 DIAGNOSIS — R161 Splenomegaly, not elsewhere classified: Secondary | ICD-10-CM

## 2015-10-06 DIAGNOSIS — Z79899 Other long term (current) drug therapy: Secondary | ICD-10-CM

## 2015-10-06 LAB — PREPARE PLATELET PHERESIS: Unit division: 0

## 2015-10-06 MED ORDER — DIPHENHYDRAMINE HCL 25 MG PO CAPS
50.0000 mg | ORAL_CAPSULE | Freq: Once | ORAL | Status: AC
  Administered 2015-10-06: 50 mg via ORAL
  Filled 2015-10-06: qty 2

## 2015-10-06 MED ORDER — SODIUM CHLORIDE 0.9 % IV SOLN
10.0000 mg | Freq: Once | INTRAVENOUS | Status: AC
  Administered 2015-10-06: 10 mg via INTRAVENOUS
  Filled 2015-10-06: qty 1

## 2015-10-06 MED ORDER — DIPHENHYDRAMINE HCL 50 MG/ML IJ SOLN
25.0000 mg | Freq: Once | INTRAMUSCULAR | Status: AC | PRN
Start: 2015-10-06 — End: 2015-10-06
  Administered 2015-10-06: 25 mg via INTRAVENOUS

## 2015-10-06 MED ORDER — SODIUM CHLORIDE 0.9% FLUSH
10.0000 mL | INTRAVENOUS | Status: DC | PRN
  Administered 2015-10-06: 10 mL
  Filled 2015-10-06: qty 10

## 2015-10-06 MED ORDER — FAMOTIDINE 20 MG PO TABS: 20.0000 mg | ORAL_TABLET | Freq: Once | ORAL | Status: DC

## 2015-10-06 MED ORDER — HYDROCORTISONE NA SUCCINATE PF 100 MG IJ SOLR
100.0000 mg | Freq: Once | INTRAMUSCULAR | Status: AC
  Administered 2015-10-06: 100 mg via INTRAVENOUS

## 2015-10-06 MED ORDER — FAMOTIDINE IN NACL 20-0.9 MG/50ML-% IV SOLN: 20.0000 mg | Freq: Two times a day (BID) | INTRAVENOUS | Status: DC

## 2015-10-06 MED ORDER — SODIUM CHLORIDE 0.9 % IV SOLN
375.0000 mg/m2 | Freq: Once | INTRAVENOUS | Status: AC
  Administered 2015-10-06: 700 mg via INTRAVENOUS
  Filled 2015-10-06: qty 60

## 2015-10-06 MED ORDER — VINCRISTINE SULFATE CHEMO INJECTION 1 MG/ML
2.0000 mg | Freq: Once | INTRAVENOUS | Status: AC
  Administered 2015-10-06: 2 mg via INTRAVENOUS
  Filled 2015-10-06: qty 2

## 2015-10-06 MED ORDER — MEPERIDINE HCL 25 MG/ML IJ SOLN
25.0000 mg | Freq: Once | INTRAMUSCULAR | Status: AC
  Administered 2015-10-06: 25 mg via INTRAVENOUS
  Filled 2015-10-06: qty 1

## 2015-10-06 MED ORDER — FAMOTIDINE IN NACL 20-0.9 MG/50ML-% IV SOLN: 20.0000 mg | Freq: Once | INTRAVENOUS | Status: DC

## 2015-10-06 MED ORDER — SODIUM CHLORIDE 0.9 % IV SOLN
1400.0000 mg | Freq: Once | INTRAVENOUS | Status: AC
  Administered 2015-10-06: 1400 mg via INTRAVENOUS
  Filled 2015-10-06: qty 20

## 2015-10-06 MED ORDER — FAMOTIDINE IN NACL 20-0.9 MG/50ML-% IV SOLN
20.0000 mg | Freq: Once | INTRAVENOUS | Status: AC
  Administered 2015-10-07: 20 mg via INTRAVENOUS
  Filled 2015-10-06: qty 50

## 2015-10-06 MED ORDER — PALONOSETRON HCL INJECTION 0.25 MG/5ML
0.2500 mg | Freq: Once | INTRAVENOUS | Status: AC
  Administered 2015-10-06: 0.25 mg via INTRAVENOUS
  Filled 2015-10-06: qty 5

## 2015-10-06 MED ORDER — SODIUM CHLORIDE 0.9 % IV SOLN
Freq: Once | INTRAVENOUS | Status: AC
  Administered 2015-10-06: 10:00:00 via INTRAVENOUS
  Filled 2015-10-06: qty 1000

## 2015-10-06 MED ORDER — MEPERIDINE HCL 25 MG/ML IJ SOLN: 25.0000 mg | Freq: Once | INTRAMUSCULAR | Status: DC

## 2015-10-06 MED ORDER — DIPHENHYDRAMINE HCL 25 MG PO CAPS
50.0000 mg | ORAL_CAPSULE | Freq: Once | ORAL | Status: DC
  Filled 2015-10-06: qty 2

## 2015-10-06 MED ORDER — ACETAMINOPHEN 325 MG PO TABS
650.0000 mg | ORAL_TABLET | Freq: Once | ORAL | Status: DC
  Filled 2015-10-06: qty 2

## 2015-10-06 MED ORDER — ACETAMINOPHEN 325 MG PO TABS
650.0000 mg | ORAL_TABLET | Freq: Once | ORAL | Status: AC
  Administered 2015-10-06: 650 mg via ORAL
  Filled 2015-10-06: qty 2

## 2015-10-06 MED ORDER — DOXORUBICIN HCL CHEMO IV INJECTION 2 MG/ML
50.0000 mg/m2 | Freq: Once | INTRAVENOUS | Status: AC
  Administered 2015-10-06: 92 mg via INTRAVENOUS
  Filled 2015-10-06: qty 46

## 2015-10-06 MED ORDER — PEGFILGRASTIM 6 MG/0.6ML ~~LOC~~ PSKT
6.0000 mg | PREFILLED_SYRINGE | Freq: Once | SUBCUTANEOUS | Status: DC
Start: 2015-10-06 — End: 2015-10-07
  Filled 2015-10-06: qty 0.6

## 2015-10-06 MED ORDER — HEPARIN SOD (PORK) LOCK FLUSH 100 UNIT/ML IV SOLN
500.0000 [IU] | Freq: Once | INTRAVENOUS | Status: AC | PRN
  Administered 2015-10-06: 500 [IU]
  Filled 2015-10-06: qty 5

## 2015-10-06 MED ORDER — HYDROCORTISONE NA SUCCINATE PF 100 MG IJ SOLR
100.0000 mg | Freq: Once | INTRAMUSCULAR | Status: DC
  Filled 2015-10-06: qty 2

## 2015-10-06 NOTE — Progress Notes (Signed)
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 CT- 1-2 CM LN subpectoral/medistinal/ retro-peritoneal/pelvic/ Right inguinal LN 1.6cm. PET- MULTIPLE LN/ Bone involvement; May 25th- R-CHOP q3 W  # Lumbar puncture- difficult-spinal headache.   # May 2017- EF- 55-65%    HISTORY OF PRESENTING ILLNESS:  Stacy Murillo 60 y.o.  female history of large B-cell lymphoma/involving the peripheral blood- is here to start cycle #1 of R- CHOP chemotherapy.  Patient's lumbar puncture was complicated by severe spinal headache needing IV fluids and steroids. Patient interim also underwent right inguinal lymph node biopsy excisional yesterday. Procedure was uneventful.  Patient has noted significant improvement in the headaches. She is walking herself. She noted to have an ulcer on the roof of the mouth few Days ago. Otherwise no skin rash. No chills.  ROS: A complete 10 point review of system is done which is negative except mentioned above in history of present illness.Marland Kitchen  MEDICAL HISTORY:  Past Medical History  Diagnosis Date  . Tachycardia   . Overweight(278.02)     Obesity  . Chest pain, unspecified   . Palpitations   . Osteoporosis   . Thrombocytopenia ([place])   . Lymphoma ([place]) 09/14/15    Monoclonal B cell lymphoma  . Diverticulosis 07/06/15  . Esophagitis 07/06/15  . Gastritis 07/06/15  . 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 GERD (gastroesophageal reflux disease)   . Fatigue   . Leg cramps   . Night sweats   . Cancer ([place])   . Dysrhythmia   . Shortness of breath dyspnea   . Arthritis   . Blood dyscrasia     SURGICAL HISTORY: Past Surgical History  Procedure Laterality Date  . Abdominal hysterectomy  2005  . Bone marrow biopsy  09/14/15  . Colonoscopy  07/05/2014  . Esophagogastroduodenoscopy  07/05/2014  . Portacath placement Right   . Peripheral vascular catheterization N/A 09/27/2015    Procedure: Glori Luis Cath Insertion;  Surgeon: Algernon Huxley, MD;  Location: [facility];  Service: Cardiovascular;  Laterality: N/A;  . Cholecystectomy  1997  . Inguinal lymph node biopsy Right 10/05/2015    Procedure: INGUINAL LYMPH NODE BIOPSY;  Surgeon: Christene Lye, MD;  Location: [facility];  Service: General;  Laterality: Right;    SOCIAL HISTORY: Social History   Social History  . Marital Status: Married    Spouse Name: N/A  . Number of Children: N/A  . Years of Education: N/A   Occupational History  . Not on file.   Social History Main Topics  . Smoking status: Never Smoker   . Smokeless tobacco: Never Used  . Alcohol Use: No  . Drug Use: No  . Sexual Activity: Not on file   Other Topics Concern  . Not on file   Social History Narrative   Married   Does not get regular exercise    FAMILY HISTORY: Family History  Problem Relation Age of Onset  . Diabetes Neg Hx   . Heart disease Father  CABG x 4  . Hypertension Mother   . Pancreatic cancer Mother   . Ulcers Mother   . Asthma Mother     ALLERGIES:  has No Known Allergies.  MEDICATIONS:  Current Outpatient Prescriptions  Medication Sig Dispense Refill  . Alum Hydroxide-Mag Carbonate (GAVISCON PO) Take by mouth.    . butalbital-acetaminophen-caffeine (FIORICET) 50-325-40 MG tablet Take 1-2 tablets by mouth every 6 (six) hours as needed for headache. 20 tablet 0  . Cholecalciferol ([employer] VIT D 5000 HIGH-POTENCY PO)  Take 1 tablet by mouth daily.     . diazepam (VALIUM) 5 MG tablet Take 1 tablet (5 mg total) by mouth every 8 (eight) hours as needed (vertigo). 20 tablet 0  . lidocaine-prilocaine (EMLA) cream Apply 1 application topically as needed. Apply generously over the Mediport 45 minutes prior to chemotherapy. 30 g 3  . ondansetron (ZOFRAN) 8 MG tablet Take 1 tablet (8 mg total) by mouth every 8 (eight) hours as needed for nausea or vomiting (start 3 days; after chemo). 40 tablet 1  . oxyCODONE-acetaminophen (PERCOCET) 7.5-325 MG tablet Take 1 tablet by mouth every 6 (six) hours as needed for severe pain. 60 tablet 0  . predniSONE (DELTASONE) 50 MG tablet Take 2 tablets once day x 5 days. START on day of your chemotherapy. Take with food. 10 tablet 4  . Probiotic Product (ALIGN PO) Take by mouth.    . prochlorperazine (COMPAZINE) 10 MG tablet Take 1 tablet (10 mg total) by mouth every 6 (six) hours as needed for nausea or vomiting. 40 tablet 1  . ranitidine (ZANTAC) 150 MG tablet Take 150 mg by mouth 2 (two) times daily.      No current facility-administered medications for this visit.      Marland Kitchen  PHYSICAL EXAMINATION:   Filed Vitals:   10/06/15 0838  BP: 128/81  Pulse: 81  Temp: 98.8 F (37.1 C)   Filed Weights   10/06/15 0838  Weight: 160 lb 4.4 oz (72.7 kg)    GENERAL: Well-nourished well-developed; Alert, no distress and mild discomfortable.  Accompanied by her husband. Appears fatigued.  EYES: no pallor or icterus OROPHARYNX: no thrush; good dentition; ~0.5cm ulcer roof of mouth NECK: supple, no masses felt LYMPH:  no palpable lymphadenopathy in the cervical, axillary; 1 to 2 cm lymph node felt in the right inguinal region. LUNGS: clear to auscultation and  No wheeze or crackles HEART/CVS: regular rate & rhythm and no murmurs; No lower extremity edema ABDOMEN: abdomen soft, non-tender and normal bowel sounds; positive for spleen.  Musculoskeletal:no cyanosis of digits and no clubbing   PSYCH: alert & oriented x 3 with fluent speech NEURO: no focal motor/sensory deficits;  SKIN:  no rashes or significant lesions  LABORATORY DATA:  I have reviewed the data as listed Lab Results  Component Value Date   WBC 3.8 10/03/2015   HGB 13.5 10/03/2015   HCT 40.5 10/03/2015   MCV 86.3 10/03/2015   PLT 66* 10/05/2015    Recent Labs  09/05/15 1137 09/30/15 0808 10/03/15 1217  NA 136 140 136  K 4.2 4.2 4.9  CL 101 101 99*  CO2 18* 22 18*  GLUCOSE 83 97 117*  BUN 17 16 17   CREATININE 0.89 0.82 0.81  CALCIUM 9.7 9.6 10.1  GFRNONAA >60 >60 >60  GFRAA >60 >60 >60  PROT 6.9 6.6 6.6  ALBUMIN 4.8 4.4 4.4  AST 37 33 48*  ALT 36 30 61*  ALKPHOS 98 99 146*  BILITOT  0.7 0.6 0.7    RADIOGRAPHIC STUDIES: I have personally reviewed the radiological images as listed and agreed with the findings in the report. Ct Head Wo Contrast  09/30/2015  CLINICAL DATA:  Non-Hodgkin's lymphoma. Lumbar puncture 1 day prior. Headache. EXAM: CT HEAD WITHOUT CONTRAST TECHNIQUE: Contiguous axial images were obtained from the base of the skull through the vertex without intravenous contrast. COMPARISON:  None. FINDINGS: No acute intracranial hemorrhage. No focal mass lesion. No CT evidence of acute infarction. No midline shift or mass effect. No hydrocephalus. Basilar cisterns are patent. Paranasal sinuses and mastoid air cells are clear. Orbits are clear. IMPRESSION: No acute intracranial findings.  Normal ventricular volume. Electronically Signed   By: Suzy Bouchard M.D.   On: 09/30/2015 09:31   Ct Chest W Contrast  09/09/2015  CLINICAL DATA:  Thrombocytopenia. Splenomegaly. Fatigue. History of cholecystectomy and hysterectomy. EXAM: CT CHEST, ABDOMEN, AND PELVIS WITH CONTRAST TECHNIQUE: Multidetector CT imaging of the chest, abdomen and pelvis was performed following the standard protocol during bolus administration of intravenous contrast. CONTRAST:  133m ISOVUE-300 IOPAMIDOL (ISOVUE-300)  INJECTION 61% COMPARISON:  03/15/2004 CT abdomen/ pelvis. 05/23/2005 MRI abdomen. No prior chest CT. FINDINGS: CT CHEST Mediastinum/Nodes: Normal heart size. No significant pericardial fluid/thickening. Great vessels are normal in course and caliber. No central pulmonary emboli. Normal visualized thyroid. Normal esophagus. There a few mildly enlarged right axillary nodes, largest 1.0 cm (series 2/ image 11). There are mildly enlarged bilateral subpectoral lymph nodes, largest 1.1 cm on the right (series 2/ image 9) and 1.0 cm on the left (series 2/ image 9). No pathologically enlarged mediastinal or hilar nodes. Lungs/Pleura: No pneumothorax. No pleural effusion. No acute consolidative airspace disease, significant pulmonary nodules or lung masses. Mild osteophyte related fibrosis in the medial right lower lobe. Musculoskeletal: No aggressive appearing focal osseous lesions. Mild degenerative changes in the thoracic spine. CT ABDOMEN AND PELVIS Hepatobiliary: Normal liver with no liver mass. Cholecystectomy. No biliary ductal dilatation. Pancreas: Normal, with no mass or duct dilation. Spleen: Moderate to marked splenomegaly (craniocaudal splenic length 18.0 cm) with approximate splenic volume 1312 cc (15.4 x 9.1 x 18.0 cm). No splenic mass. Adrenals/Urinary Tract: Normal adrenals. Normal kidneys with no hydronephrosis and no renal mass. Normal bladder. Stomach/Bowel: Grossly normal stomach. Normal caliber small bowel with no small bowel wall thickening. Normal appendix. Mild sigmoid diverticulosis, with no large bowel wall thickening or pericolonic fat stranding. Oral contrast progresses to the distal rectum. Vascular/Lymphatic: Atherosclerotic nonaneurysmal abdominal aorta. Patent portal, splenic, hepatic and renal veins. There are several mildly enlarged aortocaval and left para-aortic nodes, with the largest aortocaval node measuring 1.0 cm (series 2/image 60) in the largest left periaortic node measuring 1.0 cm  (series 2/image 68). Mild bilateral common iliac lymphadenopathy, largest 1.3 cm on the right (series 2/image 82) and 1.2 cm on the left (series 2/image 76). Mild-to-moderate right external iliac lymphadenopathy, largest 1.7 cm (series 2/image 100). Mild right inguinal lymphadenopathy, largest 1.7 cm (series 2/image 113). Reproductive: Status post hysterectomy, with no abnormal findings at the vaginal cuff. No adnexal mass. Other: No pneumoperitoneum, ascites or focal fluid collection. Musculoskeletal: No aggressive appearing focal osseous lesions. Subcentimeter sclerotic lesions in the right iliac bone and lumbar spine are nonspecific and probably benign bone islands. Mild degenerative changes in the lumbar spine. IMPRESSION: 1. Moderate to marked splenomegaly. Mild right axillary and mild bilateral subpectoral lymphadenopathy. Mild retroperitoneal and bilateral pelvic lymphadenopathy. These findings likely indicate a lymphoproliferative disorder. Tissue sampling is advised, with the most  amenable targets likely the right inguinal, right external iliac or right axillary adenopathy. 2. Mild sigmoid diverticulosis. Electronically Signed   By: Ilona Sorrel M.D.   On: 09/09/2015 08:51   Mr Jeri Cos VO Contrast  09/26/2015  CLINICAL DATA:  Large B-cell lymphoma, recently diagnosed. EXAM: MRI HEAD WITHOUT AND WITH CONTRAST TECHNIQUE: Multiplanar, multiecho pulse sequences of the brain and surrounding structures were obtained without and with intravenous contrast. CONTRAST:  23m MULTIHANCE GADOBENATE DIMEGLUMINE 529 MG/ML IV SOLN COMPARISON:  None. FINDINGS: There is no evidence of acute infarct, intracranial hemorrhage, mass, midline shift, or extra-axial fluid collection. Ventricles and sulci are normal. No significant cerebral white matter disease is seen for age. No abnormal brain parenchymal or meningeal enhancement is identified. Orbits are unremarkable. No significant paranasal sinus or mastoid inflammatory  disease is seen. Major intracranial vascular flow voids are preserved. IMPRESSION: Unremarkable appearance of the brain for age. No evidence of intracranial lymphoma. Electronically Signed   By: ALogan BoresM.D.   On: 09/26/2015 11:21   Ct Abdomen Pelvis W Contrast  09/09/2015  CLINICAL DATA:  Thrombocytopenia. Splenomegaly. Fatigue. History of cholecystectomy and hysterectomy. EXAM: CT CHEST, ABDOMEN, AND PELVIS WITH CONTRAST TECHNIQUE: Multidetector CT imaging of the chest, abdomen and pelvis was performed following the standard protocol during bolus administration of intravenous contrast. CONTRAST:  1020mISOVUE-300 IOPAMIDOL (ISOVUE-300) INJECTION 61% COMPARISON:  03/15/2004 CT abdomen/ pelvis. 05/23/2005 MRI abdomen. No prior chest CT. FINDINGS: CT CHEST Mediastinum/Nodes: Normal heart size. No significant pericardial fluid/thickening. Great vessels are normal in course and caliber. No central pulmonary emboli. Normal visualized thyroid. Normal esophagus. There a few mildly enlarged right axillary nodes, largest 1.0 cm (series 2/ image 11). There are mildly enlarged bilateral subpectoral lymph nodes, largest 1.1 cm on the right (series 2/ image 9) and 1.0 cm on the left (series 2/ image 9). No pathologically enlarged mediastinal or hilar nodes. Lungs/Pleura: No pneumothorax. No pleural effusion. No acute consolidative airspace disease, significant pulmonary nodules or lung masses. Mild osteophyte related fibrosis in the medial right lower lobe. Musculoskeletal: No aggressive appearing focal osseous lesions. Mild degenerative changes in the thoracic spine. CT ABDOMEN AND PELVIS Hepatobiliary: Normal liver with no liver mass. Cholecystectomy. No biliary ductal dilatation. Pancreas: Normal, with no mass or duct dilation. Spleen: Moderate to marked splenomegaly (craniocaudal splenic length 18.0 cm) with approximate splenic volume 1312 cc (15.4 x 9.1 x 18.0 cm). No splenic mass. Adrenals/Urinary Tract: Normal  adrenals. Normal kidneys with no hydronephrosis and no renal mass. Normal bladder. Stomach/Bowel: Grossly normal stomach. Normal caliber small bowel with no small bowel wall thickening. Normal appendix. Mild sigmoid diverticulosis, with no large bowel wall thickening or pericolonic fat stranding. Oral contrast progresses to the distal rectum. Vascular/Lymphatic: Atherosclerotic nonaneurysmal abdominal aorta. Patent portal, splenic, hepatic and renal veins. There are several mildly enlarged aortocaval and left para-aortic nodes, with the largest aortocaval node measuring 1.0 cm (series 2/image 60) in the largest left periaortic node measuring 1.0 cm (series 2/image 68). Mild bilateral common iliac lymphadenopathy, largest 1.3 cm on the right (series 2/image 82) and 1.2 cm on the left (series 2/image 76). Mild-to-moderate right external iliac lymphadenopathy, largest 1.7 cm (series 2/image 100). Mild right inguinal lymphadenopathy, largest 1.7 cm (series 2/image 113). Reproductive: Status post hysterectomy, with no abnormal findings at the vaginal cuff. No adnexal mass. Other: No pneumoperitoneum, ascites or focal fluid collection. Musculoskeletal: No aggressive appearing focal osseous lesions. Subcentimeter sclerotic lesions in the right iliac bone and lumbar spine are nonspecific  and probably benign bone islands. Mild degenerative changes in the lumbar spine. IMPRESSION: 1. Moderate to marked splenomegaly. Mild right axillary and mild bilateral subpectoral lymphadenopathy. Mild retroperitoneal and bilateral pelvic lymphadenopathy. These findings likely indicate a lymphoproliferative disorder. Tissue sampling is advised, with the most amenable targets likely the right inguinal, right external iliac or right axillary adenopathy. 2. Mild sigmoid diverticulosis. Electronically Signed   By: Ilona Sorrel M.D.   On: 09/09/2015 08:51   Nm Myocar Multi W/spect W/wall Motion / Ef  10/03/2015   T wave inversion was noted  during stress in the II and III leads, beginning at 1 minutes of stress, ending at 2 minutes of stress.  There was no ST segment deviation noted during stress.  The study is normal.  This is a low risk study.  The left ventricular ejection fraction is normal (55-65%).    Nm Pet Image Initial (pi) Skull Base To Thigh  09/26/2015  CLINICAL DATA:  Initial treatment strategy for large B-cell lymphoma. EXAM: NUCLEAR MEDICINE PET SKULL BASE TO THIGH TECHNIQUE: 11.5 mCi F-18 FDG was injected intravenously. Full-ring PET imaging was performed from the skull base to thigh after the radiotracer. CT data was obtained and used for attenuation correction and anatomic localization. FASTING BLOOD GLUCOSE:  Value: 77 mg/dl COMPARISON:  CT chest abdomen pelvis 09/09/2015. FINDINGS: NECK There is mild asymmetric hypermetabolism in the right nasopharynx, which may be tonsillar in origin. Difficult to exclude lymphomatous involvement. Numerous hypermetabolic lymph nodes are seen throughout the neck bilaterally. Index right level 5 lymph node measures 1.4 cm (CT image 43) with an SUV max of 14.0. CT images show no acute findings. CHEST Hypermetabolic supraclavicular, mediastinal, hilar, subpectoral and axillary adenopathy is seen. Index low right supraclavicular lymph node measures 12 mm (CT image 57) with an SUV max of 16.5. Right subpectoral lymph node measures 11 mm (CT image 69) with an SUV of 11.1. Subcarinal lymph node measures approximately 11 mm with an SUV max of 7.3. No hypermetabolic pulmonary nodules. CT images of show no pericardial or pleural effusion. ABDOMEN/PELVIS Spleen is enlarged and markedly hypermetabolic, with an SUV max of 21.2. There are hypermetabolic porta hepatis, abdominal peritoneal ligament, mesenteric and retroperitoneal lymph nodes. Index porta hepatis lymph node measures approximately 10 mm (CT image 135) with an SUV max of 6.0. Index aortocaval lymph node measures 9 mm (CT image 156) with an SUV  max of 7.8. Hypermetabolic lymph nodes extend along the abdominal aorta, iliac chains and inguinal regions bilaterally. Index right external iliac lymph node measures 12 mm (CT image 206) with an SUV max of 12.3. No abnormal hypermetabolism in the liver, adrenal glands or pancreas. CT images show the liver to be grossly unremarkable. Cholecystectomy. Adrenal glands, kidneys, pancreas, stomach and bowel are grossly unremarkable. No free fluid. SKELETON There is diffuse osseous hypermetabolism. IMPRESSION: 1. Hypermetabolic adenopathy throughout the neck, chest, abdomen and pelvis with splenic enlargement/hypermetabolism and diffuse osseous hypermetabolism. Findings are consistent with the given diagnosis of lymphoma. 2. Mild hypermetabolism within asymmetric right nasopharyngeal soft tissue, which may be tonsillar in origin. Difficult to exclude lymphomatous involvement. Continued attention on followup exams is warranted. Electronically Signed   By: Lorin Picket M.D.   On: 09/26/2015 11:07   Ct Biopsy  09/13/2015  CLINICAL DATA:  Thrombocytopenia. Bone marrow biopsy needed for further workup. EXAM: CT GUIDED ILIAC BONE MARROW BIOPSY ASPIRATE ANESTHESIA/SEDATION: Versed 2.0 mg IV, Fentanyl 75 mcg IV Total Moderate Sedation Time 10 minutes. The patient's level of  consciousness and physiologic status were continuously monitored during the procedure by Radiology nursing. PROCEDURE: The procedure risks, benefits, and alternatives were explained to the patient. Questions regarding the procedure were encouraged and answered. The patient understands and consents to the procedure. A time-out was performed prior to the procedure. The right gluteal region was prepped with Betadine. Sterile gown and sterile gloves were used for the procedure. Local anesthesia was provided with 1% Lidocaine. Under CT guidance, an 11 gauge OnControl bone cutting needle was advanced from a posterior approach into the right iliac bone. Needle  positioning was confirmed with CT. Initial non heparinized and heparinized aspirate samples were obtained of bone marrow. Core biopsy was performed via the OnControl needle. COMPLICATIONS: None FINDINGS: Inspection of initial aspirate did reveal visible particles. Intact core biopsy sample was obtained. IMPRESSION: CT guided bone marrow biopsy of right posterior iliac bone with both aspirate and core samples obtained. Electronically Signed   By: Aletta Edouard M.D.   On: 09/13/2015 12:39   Mm Screening Breast Tomo Bilateral  09/20/2015  CLINICAL DATA:  Screening. EXAM: 2D DIGITAL SCREENING BILATERAL MAMMOGRAM WITH CAD AND ADJUNCT TOMO COMPARISON:  Previous exam(s). ACR Breast Density Category b: There are scattered areas of fibroglandular density. FINDINGS: There are no findings suspicious for malignancy. Images were processed with CAD. IMPRESSION: No mammographic evidence of malignancy. A result letter of this screening mammogram will be mailed directly to the patient. RECOMMENDATION: Screening mammogram in one year. (Code:SM-B-01Y) BI-RADS CATEGORY  1: Negative. Electronically Signed   By: Dorise Bullion III M.D   On: 09/20/2015 17:46   Dg Nile Dear Of Needle/cath Tip For Spinal Inject Lt  09/28/2015  CLINICAL DATA:  Lymphoma EXAM: DIAGNOSTIC LUMBAR PUNCTURE UNDER FLUOROSCOPIC GUIDANCE FLUOROSCOPY TIME:  7 minutes and 48 seconds. PROCEDURE: Informed consent was obtained from the patient prior to the procedure, including potential complications of headache, allergy, and pain. With the patient prone, the lower back was prepped with Betadine. Initial attempts at lumbar puncture were performed by Dr. David Martinique. This was unsuccessful. Subsequently, I attempted lumbar puncture. The skin overlying the lumbar region was prepped and draped in a sterile fashion. 1% lidocaine was utilized for local anesthesia. Initially, a 20 gauge needle was advanced into the CSF space via left paramedian approach at L2-3.  This yielded no fluid. Subsequently, needle placement at L2-3 on the right was performed. Two drops of serosanguineous fluid was obtained. FINDINGS: Image documents needle placement in the CSF space on the right at L2-3. COMPLICATIONS: None IMPRESSION: Lumbar puncture was performed. It was a very difficult procedure and only 2 drops serosanguineous fluid was obtained. Electronically Signed   By: Marybelle Killings M.D.   On: 09/28/2015 13:17    ASSESSMENT & PLAN:   # Large B-cell lymphoma- intravascular type [based on BMBx];invlving peripheral blood. Awaiting inguinal lymph node biopsy results. Lymph node biopsy was done yesterday.   # Proceed with R- CHOP chemotherapy; 2-D echo within normal limits; hepatitis panel within normal limits.  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  # Stress ulcer/aphthous ulcer roof of mouth- recommend benzocaine/sore baking soda rinses.   # Discussed the potential side effects including but not limited to-increasing fatigue, nausea vomiting, diarrhea, hair loss, sores in the mouth, increase risk of infection and also neuropathy.   # Post spinal headache-significantly improved. Continue  oxycodone caffeine as needed.  Follow up in 10days with labs; also in 3 weeks with second cycle of chemo.  Cammie Sickle, MD 10/06/2015 8:55 AM

## 2015-10-06 NOTE — Progress Notes (Signed)
Patient here for pre chemo assessment. Patient has new onset of mouth ulcers which she states are a chronic problem.

## 2015-10-06 NOTE — Progress Notes (Signed)
1310 - Patient complained of shortness of breath, flushed feeling, heart pounding and chest tightness.  Rituxan stopped, Dr. Rogue Bussing notified and emergency protocol started. 1325 - Patient has improved, will monitor for an additional 30 min per Dr. Rogue Bussing, then restart at previous rate.

## 2015-10-06 NOTE — Progress Notes (Signed)
Platelets: 66,000. MD, Dr. Rogue Bussing, already aware. Per MD, Dr. Rogue Bussing, order: proceed with chemotherapy treatment today. 1355- Patient experiencing shivering/shaking. Patient states, "I still have a little tightness in my chest." No shortness of breath. Vital signs remain stable. MD, Dr. Rogue Bussing, notified via telephone. MD, Dr. Rogue Bussing, placing further orders. See MAR. [id number]- Patient states, "I feel much better. I no longer have any chest tightness or shaking. I am sleepy and have a headache." Vital signs stable. MD, Dr. Rogue Bussing, notified in person. Per MD, Dr. Rogue Bussing, order: re-start Rituxan at the initial rate and infuse for one hour prior to titrating the rate up. If no further signs/symptoms, MD to be notified for further instruction at this time. Rituxan re-started at 1437.  1537- Patient tolerating Rituxan infusion at initial rate. Patient states, "I continue to feel warm." No further signs/symptoms. Vital signs stable. Per MD, Dr. Rogue Bussing, order: continue with Rituxan infusion at the same initial rate until 1700 today. Stop infusion at this time and discharge patient to home. Patient returning to clinic tomorrow, 5/26, at 0800 to receive remainder of Rituxan infusion per protocol. Neulasta on-pro injector not given today. Patient returning to clinic, 5/26, and medication will be given then.

## 2015-10-07 ENCOUNTER — Other Ambulatory Visit: Payer: Self-pay | Admitting: Internal Medicine

## 2015-10-07 ENCOUNTER — Inpatient Hospital Stay: Payer: BLUE CROSS/BLUE SHIELD

## 2015-10-07 ENCOUNTER — Ambulatory Visit: Payer: BLUE CROSS/BLUE SHIELD

## 2015-10-07 VITALS — BP 114/70 | HR 85 | Temp 95.4°F | Resp 20

## 2015-10-07 DIAGNOSIS — C8583 Other specified types of non-Hodgkin lymphoma, intra-abdominal lymph nodes: Secondary | ICD-10-CM

## 2015-10-07 DIAGNOSIS — C8521 Mediastinal (thymic) large B-cell lymphoma, lymph nodes of head, face, and neck: Secondary | ICD-10-CM | POA: Diagnosis not present

## 2015-10-07 MED ORDER — ACETAMINOPHEN 325 MG PO TABS
650.0000 mg | ORAL_TABLET | Freq: Once | ORAL | Status: AC
  Administered 2015-10-07: 650 mg via ORAL

## 2015-10-07 MED ORDER — HYDROCORTISONE NA SUCCINATE PF 100 MG IJ SOLR
100.0000 mg | Freq: Once | INTRAMUSCULAR | Status: AC
Start: 2015-10-07 — End: 2015-10-07
  Administered 2015-10-07: 100 mg via INTRAVENOUS

## 2015-10-07 MED ORDER — HEPARIN SOD (PORK) LOCK FLUSH 100 UNIT/ML IV SOLN
INTRAVENOUS | Status: AC
  Filled 2015-10-07: qty 5

## 2015-10-07 MED ORDER — HEPARIN SOD (PORK) LOCK FLUSH 100 UNIT/ML IV SOLN
500.0000 [IU] | Freq: Once | INTRAVENOUS | Status: AC
  Administered 2015-10-07: 500 [IU] via INTRAVENOUS

## 2015-10-07 MED ORDER — DIPHENHYDRAMINE HCL 25 MG PO CAPS
50.0000 mg | ORAL_CAPSULE | Freq: Once | ORAL | Status: AC
  Administered 2015-10-07: 50 mg via ORAL

## 2015-10-07 MED ORDER — SODIUM CHLORIDE 0.9 % IV SOLN
INTRAVENOUS | Status: DC
  Administered 2015-10-07: 09:00:00 via INTRAVENOUS
  Filled 2015-10-07: qty 1000

## 2015-10-07 MED ORDER — PEGFILGRASTIM 6 MG/0.6ML ~~LOC~~ PSKT
6.0000 mg | PREFILLED_SYRINGE | Freq: Once | SUBCUTANEOUS | Status: AC
  Administered 2015-10-07: 6 mg via SUBCUTANEOUS

## 2015-10-07 MED ORDER — SODIUM CHLORIDE 0.9 % IV SOLN
600.0000 mg | Freq: Once | INTRAVENOUS | Status: AC
  Administered 2015-10-07: 600 mg via INTRAVENOUS
  Filled 2015-10-07: qty 60

## 2015-10-07 NOTE — Progress Notes (Signed)
Patient started Rituxan yesterday and had reaction.  Did slow infusion of Rituxan and patient received maybe around 100mg  of drug.  Per talk with MD will start patient on infusion of 600mg  today (to get total of 700mg ) and start slow and bump up as tolerated.

## 2015-10-11 ENCOUNTER — Ambulatory Visit: Payer: BLUE CROSS/BLUE SHIELD | Admitting: Cardiology

## 2015-10-11 NOTE — Anesthesia Postprocedure Evaluation (Signed)
Anesthesia Post Note  Patient: Stacy Murillo  Procedure(s) Performed: Procedure(s) (LRB): INGUINAL LYMPH NODE BIOPSY (Right)  Patient location during evaluation: PACU Anesthesia Type: General Level of consciousness: awake Pain management: pain level controlled Vital Signs Assessment: post-procedure vital signs reviewed and stable Respiratory status: spontaneous breathing Cardiovascular status: stable Anesthetic complications: no    Last Vitals:  Filed Vitals:   10/05/15 1600 10/05/15 1609  BP: 113/59 111/56  Pulse:    Temp:    Resp: 18 18    Last Pain:  Filed Vitals:   10/06/15 0823  PainSc: 0-No pain                 VAN STAVEREN,Madeeha Costantino

## 2015-10-13 ENCOUNTER — Telehealth: Payer: Self-pay | Admitting: *Deleted

## 2015-10-13 DIAGNOSIS — C8583 Other specified types of non-Hodgkin lymphoma, intra-abdominal lymph nodes: Secondary | ICD-10-CM

## 2015-10-13 DIAGNOSIS — K1370 Unspecified lesions of oral mucosa: Secondary | ICD-10-CM

## 2015-10-13 MED ORDER — MAGIC MOUTHWASH: 5.0000 mL | Freq: Four times a day (QID) | ORAL | Status: DC

## 2015-10-13 NOTE — Telephone Encounter (Signed)
I contacted Stacy Murillo personally. Due to fax machines being down in cancer center, unable to fax script for magic mouth wash. Verbal order given to [place].

## 2015-10-13 NOTE — Telephone Encounter (Signed)
Called to report that she has mouth sores and throat hurts and is having difficulty eating and swallowing. Requesting med for it possibly "that mouthwash"

## 2015-10-13 NOTE — Telephone Encounter (Signed)
I called Mr [place] and let him know that med sent to pharmacy

## 2015-10-13 NOTE — Telephone Encounter (Signed)
Magic mouthwash without lidocaine escribed to patient's pharmacy.

## 2015-10-14 ENCOUNTER — Telehealth: Payer: Self-pay | Admitting: *Deleted

## 2015-10-14 ENCOUNTER — Inpatient Hospital Stay: Payer: BLUE CROSS/BLUE SHIELD | Attending: Family Medicine

## 2015-10-14 ENCOUNTER — Inpatient Hospital Stay (HOSPITAL_BASED_OUTPATIENT_CLINIC_OR_DEPARTMENT_OTHER): Payer: BLUE CROSS/BLUE SHIELD | Admitting: Family Medicine

## 2015-10-14 ENCOUNTER — Inpatient Hospital Stay: Payer: BLUE CROSS/BLUE SHIELD

## 2015-10-14 VITALS — BP 125/75 | HR 89 | Temp 97.7°F | Resp 18 | Wt 156.7 lb

## 2015-10-14 DIAGNOSIS — K1230 Oral mucositis (ulcerative), unspecified: Secondary | ICD-10-CM | POA: Diagnosis not present

## 2015-10-14 DIAGNOSIS — Z79899 Other long term (current) drug therapy: Secondary | ICD-10-CM

## 2015-10-14 DIAGNOSIS — R7989 Other specified abnormal findings of blood chemistry: Secondary | ICD-10-CM | POA: Diagnosis not present

## 2015-10-14 DIAGNOSIS — D696 Thrombocytopenia, unspecified: Secondary | ICD-10-CM | POA: Insufficient documentation

## 2015-10-14 DIAGNOSIS — R1310 Dysphagia, unspecified: Secondary | ICD-10-CM

## 2015-10-14 DIAGNOSIS — K219 Gastro-esophageal reflux disease without esophagitis: Secondary | ICD-10-CM | POA: Diagnosis not present

## 2015-10-14 DIAGNOSIS — Z5189 Encounter for other specified aftercare: Secondary | ICD-10-CM

## 2015-10-14 DIAGNOSIS — Z86718 Personal history of other venous thrombosis and embolism: Secondary | ICD-10-CM | POA: Insufficient documentation

## 2015-10-14 DIAGNOSIS — K121 Other forms of stomatitis: Secondary | ICD-10-CM

## 2015-10-14 DIAGNOSIS — Z5111 Encounter for antineoplastic chemotherapy: Secondary | ICD-10-CM | POA: Diagnosis not present

## 2015-10-14 DIAGNOSIS — Z7901 Long term (current) use of anticoagulants: Secondary | ICD-10-CM | POA: Diagnosis not present

## 2015-10-14 DIAGNOSIS — C8521 Mediastinal (thymic) large B-cell lymphoma, lymph nodes of head, face, and neck: Secondary | ICD-10-CM

## 2015-10-14 DIAGNOSIS — K1231 Oral mucositis (ulcerative) due to antineoplastic therapy: Secondary | ICD-10-CM

## 2015-10-14 DIAGNOSIS — E669 Obesity, unspecified: Secondary | ICD-10-CM | POA: Insufficient documentation

## 2015-10-14 DIAGNOSIS — I82402 Acute embolism and thrombosis of unspecified deep veins of left lower extremity: Secondary | ICD-10-CM | POA: Diagnosis not present

## 2015-10-14 LAB — CBC WITH DIFFERENTIAL/PLATELET
BASOS ABS: 0 10*3/uL (ref 0–0.1)
Eosinophils Absolute: 0 10*3/uL (ref 0–0.7)
Eosinophils Relative: 3 %
HEMATOCRIT: 29.1 % — AB (ref 35.0–47.0)
HEMOGLOBIN: 9.8 g/dL — AB (ref 12.0–16.0)
Lymphs Abs: 0.2 10*3/uL — ABNORMAL LOW (ref 1.0–3.6)
MCH: 29.6 pg (ref 26.0–34.0)
MCHC: 33.8 g/dL (ref 32.0–36.0)
MCV: 87.5 fL (ref 80.0–100.0)
Monocytes Absolute: 0.1 10*3/uL — ABNORMAL LOW (ref 0.2–0.9)
Monocytes Relative: 15 %
NEUTROS ABS: 0.1 10*3/uL — AB (ref 1.4–6.5)
Platelets: 53 10*3/uL — ABNORMAL LOW (ref 150–440)
RBC: 3.32 MIL/uL — AB (ref 3.80–5.20)
RDW: 16.1 % — AB (ref 11.5–14.5)
WBC: 0.4 10*3/uL — AB (ref 3.6–11.0)

## 2015-10-14 LAB — BASIC METABOLIC PANEL
ANION GAP: 8 (ref 5–15)
BUN: 10 mg/dL (ref 6–20)
CALCIUM: 9 mg/dL (ref 8.9–10.3)
CHLORIDE: 100 mmol/L — AB (ref 101–111)
CO2: 29 mmol/L (ref 22–32)
Creatinine, Ser: 0.71 mg/dL (ref 0.44–1.00)
GFR calc non Af Amer: >60 mL/min (ref >60)
Glucose, Bld: 109 mg/dL — ABNORMAL HIGH (ref 65–99)
POTASSIUM: 4.1 mmol/L (ref 3.5–5.1)
Sodium: 137 mmol/L (ref 135–145)

## 2015-10-14 MED ORDER — SODIUM CHLORIDE 0.9% FLUSH
10.0000 mL | Freq: Once | INTRAVENOUS | Status: AC
Start: 2015-10-14 — End: 2015-10-14
  Administered 2015-10-14: 10 mL via INTRAVENOUS
  Filled 2015-10-14: qty 10

## 2015-10-14 MED ORDER — LIDOCAINE VISCOUS 2 % MT SOLN: 10.0000 mL | OROMUCOSAL | Status: DC | PRN

## 2015-10-14 MED ORDER — VALACYCLOVIR HCL 500 MG PO TABS: 500.0000 mg | ORAL_TABLET | Freq: Three times a day (TID) | ORAL | Status: DC

## 2015-10-14 MED ORDER — DEXAMETHASONE SODIUM PHOSPHATE 10 MG/ML IJ SOLN
10.0000 mg | Freq: Once | INTRAMUSCULAR | Status: AC
  Administered 2015-10-14: 10 mg via INTRAVENOUS
  Filled 2015-10-14: qty 1

## 2015-10-14 MED ORDER — HEPARIN SOD (PORK) LOCK FLUSH 100 UNIT/ML IV SOLN
500.0000 [IU] | Freq: Once | INTRAVENOUS | Status: AC
  Administered 2015-10-14: 500 [IU] via INTRAVENOUS
  Filled 2015-10-14: qty 5

## 2015-10-14 MED ORDER — KETOROLAC TROMETHAMINE 15 MG/ML IJ SOLN
15.0000 mg | Freq: Once | INTRAMUSCULAR | Status: AC
  Administered 2015-10-14: 15 mg via INTRAVENOUS
  Filled 2015-10-14: qty 1

## 2015-10-14 MED ORDER — FLUCONAZOLE 100 MG PO TABS: 100.0000 mg | ORAL_TABLET | Freq: Every day | ORAL | Status: DC

## 2015-10-14 MED ORDER — LIDOCAINE VISCOUS 2 % MT SOLN
15.0000 mL | Freq: Once | OROMUCOSAL | Status: AC
  Administered 2015-10-14: 15 mL via OROMUCOSAL
  Filled 2015-10-14: qty 15

## 2015-10-14 MED ORDER — SODIUM CHLORIDE 0.9 % IV SOLN
INTRAVENOUS | Status: AC
  Administered 2015-10-14: 12:00:00 via INTRAVENOUS
  Filled 2015-10-14: qty 1000

## 2015-10-14 MED ORDER — NYSTATIN 100000 UNIT/ML MT SUSP: 5.0000 mL | Freq: Four times a day (QID) | OROMUCOSAL | Status: DC

## 2015-10-14 NOTE — Progress Notes (Signed)
Pt is having difficulty swallowing due to worsening mouth sores. Mouth sores started Memorial Day weekend and have been getting worse despite trying warm baking soda mixture and duke's magic mouthwash. No oral intake at this time, unable to even swallow saliva. Having consistent headaches every morning but is controlled well with fioricet.

## 2015-10-14 NOTE — Addendum Note (Signed)
Addended by: Betti Cruz on: 10/14/2015 09:29 AM   Modules accepted: Orders

## 2015-10-14 NOTE — Progress Notes (Signed)
[facility]  [phone]  [phone]     Stacy Murillo DOB: 07/08/1955  MR#: [id number]  CSN#:[id number]  Patient Care Team: Jennifer A Walker, MD as PCP - General (Internal Medicine) Govinda R Brahmanday, MD as Consulting Physician (Internal Medicine) Seeplaputhur G Sankar, MD (General Surgery) Aileen Ingal, MD as Consulting Physician (Cardiology)  CHIEF COMPLAINT:  Chief Complaint  Patient presents with  . Dysphagia  . Mouth Lesions    INTERVAL HISTORY:  Patient is here as an acute add on regarding oral mucositis and difficulty swallowing due to pain. Patient received cycle 1 of R- CHOP on 10/06/2015. Patient called in several times this week to clinic with worsening of symptoms. She reports baking soda and warm water was trialed as well as Dukes magic mouthwash. She states neither helped with her pain. She has been unable to eat normally for 3-4 days. She also reports an acute episode of herpes simplex on the upper and lower lips. Patient reports also having such difficulty swallowing that she is having to spit out her saliva. Also had one episode of loose bowel movement this morning.   REVIEW OF SYSTEMS:   Review of Systems  Constitutional: Negative for fever, chills, weight loss, malaise/fatigue and diaphoresis.  HENT:       Herpes simplex noted on the bottom and upper lip. Patient reports severe pain even with swallowing. Ulcerations in mouth.   Eyes: Negative.   Respiratory: Negative for cough, hemoptysis, sputum production, shortness of breath and wheezing.   Cardiovascular: Negative for chest pain, palpitations, orthopnea, claudication, leg swelling and PND.  Gastrointestinal: Positive for diarrhea. Negative for heartburn, nausea, vomiting, abdominal pain, constipation, blood in stool and melena.       One episode of loose stool this morning  Genitourinary: Negative.   Musculoskeletal: Negative.   Skin: Negative.   Neurological: Negative  for dizziness, tingling, focal weakness, seizures and weakness.  Endo/Heme/Allergies: Does not bruise/bleed easily.  Psychiatric/Behavioral: Negative for depression. The patient is not nervous/anxious and does not have insomnia.     As per HPI. Otherwise, a complete review of systems is negatve.  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 CT- 1-2 CM LN subpectoral/medistinal/ retro-peritoneal/pelvic/ Right inguinal LN 1.6cm. PET- MULTIPLE LN/ Bone involvement; May 25th- R-CHOP q3 W  # Lumbar puncture- difficult-spinal headache.   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 Tachycardia   . Overweight(278.02)     Obesity  . Chest pain, unspecified   . Palpitations   . Osteoporosis   . Thrombocytopenia (HCC)   . 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 Diverticulosis 07/06/15  . Esophagitis 07/06/15  . Gastritis 07/06/15  . 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 GERD (gastroesophageal reflux disease)   . Fatigue   . Leg cramps   . Night sweats   . Cancer (HCC)   . Dysrhythmia   . Shortness of breath dyspnea   . Arthritis   . Blood dyscrasia     PAST SURGICAL HISTORY: Past Surgical History    Procedure Laterality Date  . Abdominal hysterectomy  2005  . Bone marrow biopsy  09/14/15  . Colonoscopy  07/05/2014  . Esophagogastroduodenoscopy  07/05/2014  . Portacath placement Right   . Peripheral vascular catheterization N/A 09/27/2015    Procedure: Glori Luis Cath Insertion;  Surgeon: Algernon Huxley,  MD;  Location: [place] [facility];  Service: Cardiovascular;  Laterality: N/A;  . Cholecystectomy  1997  . Inguinal lymph node biopsy Right 10/05/2015    Procedure: INGUINAL LYMPH NODE BIOPSY;  Surgeon: Christene Lye, MD;  Location: [facility];  Service: General;  Laterality: Right;    FAMILY HISTORY Family History  Problem Relation Age of Onset  . Diabetes Neg Hx   . Heart disease Father     CABG x 4  . Hypertension Mother   . Pancreatic cancer Mother   . Ulcers Mother   . Asthma Mother     GYNECOLOGIC HISTORY:  No LMP recorded. Patient has had a hysterectomy.     ADVANCED DIRECTIVES:    HEALTH MAINTENANCE: Social History  Substance Use Topics  . Smoking status: Never Smoker   . Smokeless tobacco: Never Used  . Alcohol Use: No     No Known Allergies  Current Outpatient Prescriptions  Medication Sig Dispense Refill  . Alum Hydroxide-Mag Carbonate (GAVISCON PO) Take by mouth.    . butalbital-acetaminophen-caffeine (FIORICET) 50-325-40 MG tablet Take 1-2 tablets by mouth every 6 (six) hours as needed for headache. 20 tablet 0  . Cholecalciferol ([employer] VIT D 5000 HIGH-POTENCY PO) Take 1 tablet by mouth daily.     . diazepam (VALIUM) 5 MG tablet Take 1 tablet (5 mg total) by mouth every 8 (eight) hours as needed (vertigo). 20 tablet 0  . lidocaine-prilocaine (EMLA) cream Apply 1 application topically as needed. Apply generously over the Mediport 45 minutes prior to chemotherapy. 30 g 3  . magic mouthwash SOLN Take 5 mLs by mouth 4 (four) times daily. 250 mL 4  . ondansetron (ZOFRAN) 8 MG tablet Take 1 tablet (8 mg total) by mouth every 8 (eight) hours as needed for nausea or vomiting (start 3 days; after chemo). 40 tablet 1  . oxyCODONE-acetaminophen (PERCOCET) 7.5-325 MG tablet Take 1 tablet by mouth every 6 (six) hours as needed for severe pain. 60 tablet 0  . predniSONE (DELTASONE) 50 MG tablet Take 2 tablets once day x 5 days. START on day of your chemotherapy. Take  with food. 10 tablet 4  . Probiotic Product (ALIGN PO) Take by mouth.    . prochlorperazine (COMPAZINE) 10 MG tablet Take 1 tablet (10 mg total) by mouth every 6 (six) hours as needed for nausea or vomiting. 40 tablet 1  . ranitidine (ZANTAC) 150 MG tablet Take 150 mg by mouth 2 (two) times daily.     . fluconazole (DIFLUCAN) 100 MG tablet Take 1 tablet (100 mg total) by mouth daily. 30 tablet 0  . lidocaine (XYLOCAINE) 2 % solution Use as directed 10 mLs in the mouth or throat every 4 (four) hours as needed for mouth pain (30 min prior to eating). 100 mL 0  . nystatin (MYCOSTATIN) 100000 UNIT/ML suspension Take 5 mLs (500,000 Units total) by mouth 4 (four) times daily. 180 mL 1  . valACYclovir (VALTREX) 500 MG tablet Take 1 tablet (500 mg total) by mouth 3 (three) times daily. 90 tablet 3   No current facility-administered medications for this visit.   Facility-Administered Medications Ordered in Other Visits  Medication Dose Route Frequency  Provider Last Rate Last Dose  . 0.9 %  sodium chloride infusion   Intravenous Continuous Leslie F Herring, NP   Stopped at 10/14/15 1312    OBJECTIVE: BP 125/75 mmHg  Pulse 89  Temp(Src) 97.7 F (36.5 C) (Tympanic)  Resp 18  Wt 156 lb 12 oz (71.1 kg)   Body mass index is 28.66 kg/(m^2).    ECOG FS:1 - Symptomatic but completely ambulatory  General: Well-developed, well-nourished, Mild distress. Eyes: Pink conjunctiva, anicteric sclera. HEENT: Large linear ulceration in the soft palate of the mouth, several circular areas that appear to be fungal along the cheeks and gum line.  Lungs: Clear to auscultation bilaterally. Heart: Regular rate and rhythm. No rubs, murmurs, or gallops. Musculoskeletal: No edema, cyanosis, or clubbing. Neuro: Alert, answering all questions appropriately. Cranial nerves grossly intact. Skin: No rashes or petechiae noted. Psych: Normal affect.   LAB RESULTS:  Appointment on 10/14/2015  Component Date Value Ref Range  Status  . WBC 10/14/2015 0.4* 3.6 - 11.0 K/uL Final   Comment: RESULT REPEATED AND VERIFIED CANCER CENTER CRITICAL VALUE PROTOCOL   . RBC 10/14/2015 3.32* 3.80 - 5.20 MIL/uL Final  . Hemoglobin 10/14/2015 9.8* 12.0 - 16.0 g/dL Final  . HCT 10/14/2015 29.1* 35.0 - 47.0 % Final  . MCV 10/14/2015 87.5  80.0 - 100.0 fL Final  . MCH 10/14/2015 29.6  26.0 - 34.0 pg Final  . MCHC 10/14/2015 33.8  32.0 - 36.0 g/dL Final  . RDW 10/14/2015 16.1* 11.5 - 14.5 % Final  . Platelets 10/14/2015 53* 150 - 440 K/uL Final  . Neutrophils Relative % 10/14/2015 29%   Final  . Neutro Abs 10/14/2015 0.1* 1.4 - 6.5 K/uL Final   Comment: RESULT REPEATED AND VERIFIED CRITICAL RESULT CALLED TO, READ BACK BY AND VERIFIED WITH: NYO TO ANITA BLACK 10/14/15 1125   . Lymphocytes Relative 10/14/2015 50%   Final  . Lymphs Abs 10/14/2015 0.2* 1.0 - 3.6 K/uL Final  . Monocytes Relative 10/14/2015 15%   Final  . Monocytes Absolute 10/14/2015 0.1* 0.2 - 0.9 K/uL Final  . Eosinophils Relative 10/14/2015 3%   Final  . Eosinophils Absolute 10/14/2015 0.0  0 - 0.7 K/uL Final  . Basophils Relative 10/14/2015 3%   Final  . Basophils Absolute 10/14/2015 0.0  0 - 0.1 K/uL Final  . Sodium 10/14/2015 137  135 - 145 mmol/L Final  . Potassium 10/14/2015 4.1  3.5 - 5.1 mmol/L Final  . Chloride 10/14/2015 100* 101 - 111 mmol/L Final  . CO2 10/14/2015 29  22 - 32 mmol/L Final  . Glucose, Bld 10/14/2015 109* 65 - 99 mg/dL Final  . BUN 10/14/2015 10  6 - 20 mg/dL Final  . Creatinine, Ser 10/14/2015 0.71  0.44 - 1.00 mg/dL Final  . Calcium 10/14/2015 9.0  8.9 - 10.3 mg/dL Final  . GFR calc non Af Amer 10/14/2015 >60  >60 mL/min Final  . GFR calc Af Amer 10/14/2015 >60  >60 mL/min Final   Comment: (NOTE) The eGFR has been calculated using the CKD EPI equation. This calculation has not been validated in all clinical situations. eGFR's persistently <60 mL/min signify possible Chronic Kidney Disease.   . Anion gap 10/14/2015 8  5 - 15  Final    STUDIES: No results found.  ASSESSMENT:  Large B-cell lymphoma. Stomatitis.  PLAN:  1. Large B-cell lymphoma. Involving peripheral blood. Patient received R CHOP chemotherapy on the 10/06/2015. 2. Stomatitis. Patient with severe oral mucositis at this time   even after trying baking soda rinses as well as Dukes Magic mouthwash.Will start patient on valacyclovir 500 mg 3 times a day for the next 2 weeks, patient is known to decrease dosage to 500 mg twice a day as a maintenance prophylaxis. We will also start patient on 100 mg of Diflucan for the next 3 days, with instructions that if there is no improvement by Monday she is to continue for 2 additional days. Patient was also given a prescription for viscous lidocaine to use approximately 10mls 30 minutes prior to meals in order to get some pain relief so that she is able to eat. Nystatin was also ordered for patient to use every 6 hours, 4 times a day, to swish and swallow. Instructions for use of each medication were typed and given to patient's husband.  We will administer IV fluids, dexamethasone, as well as Toradol for pain management in infusion area. Patient was also advised to monitor diarrhea/loose stools closely. She is unable to use Imodium due to severe epigastric cramping.  Patient expressed understanding and was in agreement with this plan. She also understands that She can call clinic at any time with any questions, concerns, or complaints.   Dr. Brahmanday was available for consultation and review of plan of care for this patient.  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      

## 2015-10-14 NOTE — Telephone Encounter (Signed)
ANC 0.1 Total WBC 0.4  Notified MD Rogue Bussing) also NP Magda Paganini).

## 2015-10-14 NOTE — Telephone Encounter (Signed)
Called to state that her throat hurts so bad that she cannot eat and she is spitting out her saliva. It hurts up into her ears and she has a HA. States the MMW is not helping at all. Asking to be seen. Per Dr Rogue Bussing, lab, see NP and Possible IVF.  Agrees to 1115 appt

## 2015-10-18 ENCOUNTER — Ambulatory Visit: Payer: BLUE CROSS/BLUE SHIELD | Admitting: General Surgery

## 2015-10-18 ENCOUNTER — Other Ambulatory Visit: Payer: Self-pay | Admitting: Internal Medicine

## 2015-10-19 ENCOUNTER — Emergency Department: Payer: BLUE CROSS/BLUE SHIELD

## 2015-10-19 ENCOUNTER — Inpatient Hospital Stay
Admission: EM | Admit: 2015-10-19 | Discharge: 2015-10-20 | DRG: 300 | Disposition: A | Discharge status: Home / Self Care | Payer: BLUE CROSS/BLUE SHIELD | Attending: Internal Medicine | Admitting: Internal Medicine

## 2015-10-19 ENCOUNTER — Other Ambulatory Visit: Payer: Self-pay | Admitting: Internal Medicine

## 2015-10-19 DIAGNOSIS — T375X5A Adverse effect of antiviral drugs, initial encounter: Secondary | ICD-10-CM | POA: Diagnosis present

## 2015-10-19 DIAGNOSIS — Z79899 Other long term (current) drug therapy: Secondary | ICD-10-CM

## 2015-10-19 DIAGNOSIS — Z825 Family history of asthma and other chronic lower respiratory diseases: Secondary | ICD-10-CM

## 2015-10-19 DIAGNOSIS — R740 Nonspecific elevation of levels of transaminase and lactic acid dehydrogenase [LDH]: Secondary | ICD-10-CM | POA: Diagnosis present

## 2015-10-19 DIAGNOSIS — M1990 Unspecified osteoarthritis, unspecified site: Secondary | ICD-10-CM

## 2015-10-19 DIAGNOSIS — Z7952 Long term (current) use of systemic steroids: Secondary | ICD-10-CM | POA: Diagnosis not present

## 2015-10-19 DIAGNOSIS — Z800 Family history of malignant neoplasm of digestive organs: Secondary | ICD-10-CM

## 2015-10-19 DIAGNOSIS — K219 Gastro-esophageal reflux disease without esophagitis: Secondary | ICD-10-CM | POA: Diagnosis present

## 2015-10-19 DIAGNOSIS — I824Z2 Acute embolism and thrombosis of unspecified deep veins of left distal lower extremity: Secondary | ICD-10-CM | POA: Diagnosis not present

## 2015-10-19 DIAGNOSIS — C8521 Mediastinal (thymic) large B-cell lymphoma, lymph nodes of head, face, and neck: Secondary | ICD-10-CM

## 2015-10-19 DIAGNOSIS — Z79891 Long term (current) use of opiate analgesic: Secondary | ICD-10-CM

## 2015-10-19 DIAGNOSIS — I82402 Acute embolism and thrombosis of unspecified deep veins of left lower extremity: Secondary | ICD-10-CM

## 2015-10-19 DIAGNOSIS — M810 Age-related osteoporosis without current pathological fracture: Secondary | ICD-10-CM | POA: Diagnosis present

## 2015-10-19 DIAGNOSIS — R7989 Other specified abnormal findings of blood chemistry: Secondary | ICD-10-CM | POA: Diagnosis present

## 2015-10-19 DIAGNOSIS — D696 Thrombocytopenia, unspecified: Secondary | ICD-10-CM | POA: Diagnosis present

## 2015-10-19 DIAGNOSIS — C8510 Unspecified B-cell lymphoma, unspecified site: Secondary | ICD-10-CM | POA: Diagnosis present

## 2015-10-19 DIAGNOSIS — R509 Fever, unspecified: Secondary | ICD-10-CM | POA: Diagnosis present

## 2015-10-19 DIAGNOSIS — I82492 Acute embolism and thrombosis of other specified deep vein of left lower extremity: Secondary | ICD-10-CM | POA: Diagnosis present

## 2015-10-19 DIAGNOSIS — Z8249 Family history of ischemic heart disease and other diseases of the circulatory system: Secondary | ICD-10-CM

## 2015-10-19 DIAGNOSIS — R945 Abnormal results of liver function studies: Secondary | ICD-10-CM | POA: Diagnosis not present

## 2015-10-19 LAB — CBC WITH DIFFERENTIAL/PLATELET
BAND NEUTROPHILS: 3 %
BASOS ABS: 0.1 10*3/uL (ref 0–0.1)
BLASTS: 0 %
Basophils Relative: 1 %
EOS ABS: 0 10*3/uL (ref 0–0.7)
Eosinophils Relative: 0 %
HEMATOCRIT: 26.8 % — AB (ref 35.0–47.0)
HEMOGLOBIN: 8.9 g/dL — AB (ref 12.0–16.0)
LYMPHS PCT: 7 %
Lymphs Abs: 0.6 10*3/uL — ABNORMAL LOW (ref 1.0–3.6)
MCH: 29.5 pg (ref 26.0–34.0)
MCHC: 33.3 g/dL (ref 32.0–36.0)
MCV: 88.4 fL (ref 80.0–100.0)
Metamyelocytes Relative: 1 %
Monocytes Absolute: 0.8 10*3/uL (ref 0.2–0.9)
Monocytes Relative: 10 %
Myelocytes: 2 %
Neutro Abs: 6.8 10*3/uL — ABNORMAL HIGH (ref 1.4–6.5)
Neutrophils Relative %: 76 %
OTHER: 0 %
PROMYELOCYTES ABS: 0 %
Platelets: 153 10*3/uL (ref 150–440)
RBC: 3.03 MIL/uL — AB (ref 3.80–5.20)
RDW: 16.2 % — ABNORMAL HIGH (ref 11.5–14.5)
WBC: 8.3 10*3/uL (ref 3.6–11.0)
nRBC: 1 /100 WBC — ABNORMAL HIGH

## 2015-10-19 LAB — URINALYSIS COMPLETE WITH MICROSCOPIC (ARMC ONLY)
BILIRUBIN URINE: NEGATIVE
Bacteria, UA: NONE SEEN
Glucose, UA: NEGATIVE mg/dL
Hgb urine dipstick: NEGATIVE
KETONES UR: NEGATIVE mg/dL
Leukocytes, UA: NEGATIVE
Nitrite: NEGATIVE
PROTEIN: NEGATIVE mg/dL
SPECIFIC GRAVITY, URINE: 1.009 (ref 1.005–1.030)
Squamous Epithelial / LPF: NONE SEEN
pH: 7 (ref 5.0–8.0)

## 2015-10-19 LAB — COMPREHENSIVE METABOLIC PANEL
ALBUMIN: 3.3 g/dL — AB (ref 3.5–5.0)
ALK PHOS: 244 U/L — AB (ref 38–126)
ALT: 302 U/L — ABNORMAL HIGH (ref 14–54)
AST: 353 U/L — AB (ref 15–41)
Anion gap: 8 (ref 5–15)
BILIRUBIN TOTAL: 0.3 mg/dL (ref 0.3–1.2)
BUN: 7 mg/dL (ref 6–20)
CALCIUM: 8.2 mg/dL — AB (ref 8.9–10.3)
CO2: 25 mmol/L (ref 22–32)
CREATININE: 0.76 mg/dL (ref 0.44–1.00)
Chloride: 103 mmol/L (ref 101–111)
GFR calc Af Amer: >60 mL/min (ref >60)
GFR calc non Af Amer: >60 mL/min (ref >60)
GLUCOSE: 121 mg/dL — AB (ref 65–99)
Potassium: 3.5 mmol/L (ref 3.5–5.1)
Sodium: 136 mmol/L (ref 135–145)
TOTAL PROTEIN: 5.9 g/dL — AB (ref 6.5–8.1)

## 2015-10-19 LAB — LACTIC ACID, PLASMA: Lactic Acid, Venous: 0.9 mmol/L (ref 0.5–2.0)

## 2015-10-19 LAB — PROTIME-INR
INR: 1.26
Prothrombin Time: 15.9 seconds — ABNORMAL HIGH (ref 11.4–15.0)

## 2015-10-19 LAB — LACTATE DEHYDROGENASE: LDH: 319 U/L — AB (ref 98–192)

## 2015-10-19 LAB — MAGNESIUM: MAGNESIUM: 1.6 mg/dL — AB (ref 1.7–2.4)

## 2015-10-19 LAB — APTT

## 2015-10-19 LAB — CK: Total CK: 11 U/L — ABNORMAL LOW (ref 38–234)

## 2015-10-19 LAB — HEPARIN LEVEL (UNFRACTIONATED): HEPARIN UNFRACTIONATED: 0.33 [IU]/mL (ref 0.30–0.70)

## 2015-10-19 MED ORDER — OXYCODONE-ACETAMINOPHEN 7.5-325 MG PO TABS
1.0000 | ORAL_TABLET | Freq: Four times a day (QID) | ORAL | Status: DC | PRN
  Administered 2015-10-20: 1 via ORAL
  Filled 2015-10-19: qty 1

## 2015-10-19 MED ORDER — HYDROCODONE-ACETAMINOPHEN 5-325 MG PO TABS: 1.0000 | ORAL_TABLET | ORAL | Status: DC | PRN

## 2015-10-19 MED ORDER — ONDANSETRON HCL 4 MG PO TABS: 8.0000 mg | ORAL_TABLET | Freq: Three times a day (TID) | ORAL | Status: DC | PRN

## 2015-10-19 MED ORDER — SODIUM CHLORIDE 0.9 % IV BOLUS (SEPSIS)
1000.0000 mL | Freq: Once | INTRAVENOUS | Status: AC
  Administered 2015-10-19: 1000 mL via INTRAVENOUS

## 2015-10-19 MED ORDER — FLUCONAZOLE 100 MG PO TABS: 100.0000 mg | ORAL_TABLET | Freq: Every day | ORAL | Status: DC

## 2015-10-19 MED ORDER — NYSTATIN 100000 UNIT/ML MT SUSP
5.0000 mL | Freq: Four times a day (QID) | OROMUCOSAL | Status: DC
  Filled 2015-10-19 (×2): qty 5

## 2015-10-19 MED ORDER — DIAZEPAM 5 MG/ML IJ SOLN
5.0000 mg | Freq: Once | INTRAMUSCULAR | Status: AC
  Administered 2015-10-19: 5 mg via INTRAVENOUS
  Filled 2015-10-19: qty 2

## 2015-10-19 MED ORDER — ACETAMINOPHEN 325 MG PO TABS: 650.0000 mg | ORAL_TABLET | Freq: Four times a day (QID) | ORAL | Status: DC | PRN

## 2015-10-19 MED ORDER — MORPHINE SULFATE (PF) 2 MG/ML IV SOLN: 1.0000 mg | INTRAVENOUS | Status: DC | PRN

## 2015-10-19 MED ORDER — ONDANSETRON HCL 4 MG/2ML IJ SOLN: 4.0000 mg | Freq: Four times a day (QID) | INTRAMUSCULAR | Status: DC | PRN

## 2015-10-19 MED ORDER — ACETAMINOPHEN 650 MG RE SUPP: 650.0000 mg | Freq: Four times a day (QID) | RECTAL | Status: DC | PRN

## 2015-10-19 MED ORDER — BUTALBITAL-APAP-CAFFEINE 50-325-40 MG PO TABS
1.0000 | ORAL_TABLET | Freq: Four times a day (QID) | ORAL | Status: DC | PRN
  Administered 2015-10-20: 1 via ORAL
  Filled 2015-10-19: qty 1

## 2015-10-19 MED ORDER — ACETAMINOPHEN 325 MG PO TABS
ORAL_TABLET | ORAL | Status: AC
  Administered 2015-10-19: 650 mg
  Filled 2015-10-19: qty 2

## 2015-10-19 MED ORDER — HEPARIN (PORCINE) IN NACL 100-0.45 UNIT/ML-% IJ SOLN
1100.0000 [IU]/h | INTRAMUSCULAR | Status: AC
  Administered 2015-10-19: 1100 [IU]/h via INTRAVENOUS
  Filled 2015-10-19 (×3): qty 250

## 2015-10-19 MED ORDER — HEPARIN BOLUS VIA INFUSION
4000.0000 [IU] | Freq: Once | INTRAVENOUS | Status: AC
  Administered 2015-10-19: 4000 [IU] via INTRAVENOUS
  Filled 2015-10-19: qty 4000

## 2015-10-19 MED ORDER — DIAZEPAM 5 MG PO TABS: 5.0000 mg | ORAL_TABLET | Freq: Three times a day (TID) | ORAL | Status: DC | PRN

## 2015-10-19 MED ORDER — MORPHINE SULFATE (PF) 4 MG/ML IV SOLN
4.0000 mg | Freq: Once | INTRAVENOUS | Status: DC
  Filled 2015-10-19: qty 1

## 2015-10-19 MED ORDER — PROCHLORPERAZINE MALEATE 10 MG PO TABS
10.0000 mg | ORAL_TABLET | Freq: Four times a day (QID) | ORAL | Status: DC | PRN
  Filled 2015-10-19: qty 1

## 2015-10-19 MED ORDER — MAGNESIUM SULFATE IN D5W 1-5 GM/100ML-% IV SOLN
1.0000 g | Freq: Once | INTRAVENOUS | Status: AC
  Administered 2015-10-19: 1 g via INTRAVENOUS
  Filled 2015-10-19 (×2): qty 100

## 2015-10-19 MED ORDER — FAMOTIDINE 20 MG PO TABS
10.0000 mg | ORAL_TABLET | Freq: Every day | ORAL | Status: DC
  Administered 2015-10-20: 10 mg via ORAL
  Filled 2015-10-19 (×2): qty 1

## 2015-10-19 MED ORDER — ONDANSETRON HCL 4 MG PO TABS
4.0000 mg | ORAL_TABLET | Freq: Four times a day (QID) | ORAL | Status: DC | PRN
  Administered 2015-10-19 – 2015-10-20 (×2): 4 mg via ORAL
  Filled 2015-10-19 (×2): qty 1

## 2015-10-19 MED ORDER — IBUPROFEN 600 MG PO TABS
600.0000 mg | ORAL_TABLET | Freq: Once | ORAL | Status: AC
  Administered 2015-10-19: 600 mg via ORAL
  Filled 2015-10-19: qty 1

## 2015-10-19 MED ORDER — LIDOCAINE VISCOUS 2 % MT SOLN
10.0000 mL | OROMUCOSAL | Status: DC | PRN
  Filled 2015-10-19: qty 10

## 2015-10-19 NOTE — ED Notes (Signed)
Pt up to bathroom, eating dinner tray now.  Pt alert.  Heparin infusing.  Pt waiting on room assignment.

## 2015-10-19 NOTE — ED Notes (Addendum)
Pt reports that the muscles in the back of her legs are "tight" and she feels she is having a reaction to her chemo - Her first treatment was 2 weeks ago Thursday and she had a "reaction to it" which has continued to cause her to have muscle cramps in the backs of her legs off and on for 2 weeks - Pt states during the night she has had hot flashes alternating with cold chills - Pt also reports that her legs are weak - Pt says the difference this am was because she could not take the discomfort anymore and the chills

## 2015-10-19 NOTE — ED Notes (Signed)
Patient does not want morphine at this time.  Discussed this with Dr. Darl Householder.

## 2015-10-19 NOTE — ED Notes (Signed)
Blue top drawn for PT/PTT from left AC post heparin bolus.  Pharmacy notified.

## 2015-10-19 NOTE — ED Notes (Signed)
Pt continues to wait on beds assigment.  Pt eating dinner.

## 2015-10-19 NOTE — Progress Notes (Signed)
ANTICOAGULATION CONSULT NOTE - Initial Consult  Pharmacy Consult for Heparin Indication: DVT  No Known Allergies  Patient Measurements:   Heparin Dosing Weight: 65 kg  Vital Signs: Temp: 99.1 F (37.3 C) (06/07 1316) Temp Source: Oral (06/07 1316) BP: 126/63 mmHg (06/07 1316) Pulse Rate: 82 (06/07 1316)  Labs:  Recent Labs  10/19/15 0653  HGB 8.9*  HCT 26.8*  PLT 153  CREATININE 0.76  CKTOTAL 11*    Estimated Creatinine Clearance: 68.2 mL/min (by C-G formula based on Cr of 0.76).   Medical History: Past Medical History  Diagnosis Date  . Tachycardia   . Overweight(278.02)     Obesity  . Chest pain, unspecified   . Palpitations   . Osteoporosis   . Thrombocytopenia ([place])   . Lymphoma ([place]) 09/14/15    Monoclonal B cell lymphoma  . Diverticulosis 07/06/15  . Esophagitis 07/06/15  . Gastritis 07/06/15  . 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 GERD (gastroesophageal reflux disease)   . Fatigue   . Leg cramps   . Night sweats   . Cancer ([place])   . Dysrhythmia   . Shortness of breath dyspnea   . Arthritis   . Blood dyscrasia     Medications:   (Not in a hospital admission) Scheduled:   Infusions:  . heparin 1,100 Units/hr (10/19/15 1329)    Assessment: 60 y/o F with a h/o lymphoma on chemotherapy admitted with LE DVT. Patient not on anticoagulants PTA per history and per patient.   Goal of Therapy:  Heparin level 0.3-0.7 units/ml Monitor platelets by anticoagulation protocol: Yes   Plan:  Give 4000 units bolus x 1 Start heparin infusion at 1100 units/hr Check anti-Xa level in 6 hours and daily while on heparin Continue to monitor H&H and platelets  Ulice Dash D 10/19/2015,1:38 PM

## 2015-10-19 NOTE — H&P (Signed)
[facility] NAME: Stacy Murillo    MR#:  [id number]  DATE OF BIRTH:  Jun 24, 1955  DATE OF ADMISSION:  10/19/2015  PRIMARY CARE PHYSICIAN: Rica Mast, MD   REQUESTING/REFERRING PHYSICIAN: Dr. Darl Householder  CHIEF COMPLAINT:  Left lower extremity cramping pain for 2 days  HISTORY OF PRESENT ILLNESS:  Stacy Murillo  is a 60 y.o. female with a known history of Monoclonal B cell lymphoma undergoing chemotherapy, thrombocytopenia, arthritis. Emergency room with complaints of left leg cramping pain. Workup showed patient possibly has DVT in the branch of peroneal wane in the calf. In the emergency room she started having chills temperature was checked she had 102.2. Denies any cough dysuria abdominal pain nausea or vomiting denies any rash Patient was also found to have elevated LFTs. She was recently started on valtrex and fluconazole for prevention of oral lesions since she is on chemotherapy. Patient denies any oral lesions denies any dysphagia.  PAST MEDICAL HISTORY:   Past Medical History  Diagnosis Date  . Tachycardia   . Overweight(278.02)     Obesity  . Chest pain, unspecified   . Palpitations   . Osteoporosis   . Thrombocytopenia ([place])   . Lymphoma ([place]) 09/14/15    Monoclonal B cell lymphoma  . Diverticulosis 07/06/15  . Esophagitis 07/06/15  . Gastritis 07/06/15  . 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 GERD (gastroesophageal reflux disease)   . Fatigue   . Leg cramps   . Night sweats   . Cancer ([place])   . Dysrhythmia   . Shortness of breath dyspnea   . Arthritis   . Blood dyscrasia     PAST SURGICAL HISTOIRY:   Past Surgical History  Procedure Laterality Date  . Abdominal hysterectomy  2005  . Bone marrow biopsy  09/14/15  . Colonoscopy  07/05/2014  . Esophagogastroduodenoscopy  07/05/2014  . Portacath placement Right   . Peripheral vascular catheterization N/A 09/27/2015    Procedure: Glori Luis Cath Insertion;   Surgeon: Algernon Huxley, MD;  Location: [facility];  Service: Cardiovascular;  Laterality: N/A;  . Cholecystectomy  1997  . Inguinal lymph node biopsy Right 10/05/2015    Procedure: INGUINAL LYMPH NODE BIOPSY;  Surgeon: Christene Lye, MD;  Location: [facility];  Service: General;  Laterality: Right;    SOCIAL HISTORY:   Social History  Substance Use Topics  . Smoking status: Never Smoker   . Smokeless tobacco: Never Used  . Alcohol Use: No    FAMILY HISTORY:   Family History  Problem Relation Age of Onset  . Diabetes Neg Hx   . Heart disease Father     CABG x 4  . Hypertension Mother   . Pancreatic cancer Mother   . Ulcers Mother   . Asthma Mother     DRUG ALLERGIES:  No Known Allergies  REVIEW OF SYSTEMS:  Review of Systems  Constitutional: Negative for fever, chills and weight loss.  HENT: Negative for ear discharge, ear pain and nosebleeds.   Eyes: Negative for blurred vision, pain and discharge.  Respiratory: Positive for cough and shortness of breath. Negative for sputum production, wheezing and stridor.   Cardiovascular: Negative for chest pain, palpitations, orthopnea and PND.  Gastrointestinal: Negative for nausea, vomiting, abdominal pain and diarrhea.  Genitourinary: Negative for urgency and frequency.  Musculoskeletal: Negative for back pain and joint pain.  Neurological: Positive for weakness. Negative for sensory change, speech change and focal weakness.  Psychiatric/Behavioral: Negative for depression and hallucinations. The patient is not nervous/anxious.   All other systems reviewed and are negative.    MEDICATIONS AT HOME:   Prior to Admission medications   Medication Sig Start Date End Date Taking? Authorizing Provider  butalbital-acetaminophen-caffeine (FIORICET) 50-325-40 MG tablet Take 1-2 tablets by mouth every 6 (six) hours as needed for headache. 09/30/15 09/29/16 Yes Earleen Newport, MD  diazepam (VALIUM) 5 MG tablet Take 5 mg  by mouth every 8 (eight) hours as needed (for vertigo).   Yes Historical Provider, MD  lidocaine-prilocaine (EMLA) cream Apply 1 application topically as needed (prior to accessing port).   Yes Historical Provider, MD  ondansetron (ZOFRAN) 8 MG tablet Take 8 mg by mouth every 8 (eight) hours as needed for nausea or vomiting.   Yes Historical Provider, MD  oxyCODONE-acetaminophen (PERCOCET) 7.5-325 MG tablet Take 1 tablet by mouth every 6 (six) hours as needed for severe pain. 10/03/15  Yes Cammie Sickle, MD  predniSONE (DELTASONE) 50 MG tablet Take 100 mg by mouth daily with breakfast. Pt takes for five days starting the day of chemo.   Yes Historical Provider, MD  prochlorperazine (COMPAZINE) 10 MG tablet Take 1 tablet (10 mg total) by mouth every 6 (six) hours as needed for nausea or vomiting. 10/03/15  Yes Cammie Sickle, MD  ranitidine (ZANTAC) 150 MG tablet Take 150 mg by mouth 2 (two) times daily.    Yes Historical Provider, MD  valACYclovir (VALTREX) 500 MG tablet Take 1 tablet (500 mg total) by mouth 3 (three) times daily. 10/14/15  Yes Evlyn Kanner, NP      VITAL SIGNS:  Blood pressure 126/63, pulse 82, temperature 99.1 F (37.3 C), temperature source Oral, resp. rate 20, SpO2 97 %.  PHYSICAL EXAMINATION:  GENERAL:  60 y.o.-year-old patient lying in the bed with no acute distress.  EYES: Pupils equal, round, reactive to light and accommodation. No scleral icterus. Extraocular muscles intact.  HEENT: Head atraumatic, normocephalic. Oropharynx and nasopharynx clear.  NECK:  Supple, no jugular venous distention. No thyroid enlargement, no tenderness.  LUNGS: Normal breath sounds bilaterally, no wheezing, rales,rhonchi or crepitation. No use of accessory muscles of respiration.  CARDIOVASCULAR: S1, S2 normal. No murmurs, rubs, or gallops.  ABDOMEN: Soft, nontender, nondistended. Bowel sounds present. No organomegaly or mass.  EXTREMITIES: No pedal edema, cyanosis, or  clubbing.  NEUROLOGIC: Cranial nerves II through XII are intact. Muscle strength 5/5 in all extremities. Sensation intact. Gait not checked.  PSYCHIATRIC: The patient is alert and oriented x 3.  SKIN: No obvious rash, lesion, or ulcer.   LABORATORY PANEL:   CBC  Recent Labs Lab 10/19/15 0653  WBC 8.3  HGB 8.9*  HCT 26.8*  PLT 153   ------------------------------------------------------------------------------------------------------------------  Chemistries   Recent Labs Lab 10/19/15 0653 10/19/15 1005  NA 136  --   K 3.5  --   CL 103  --   CO2 25  --   GLUCOSE 121*  --   BUN 7  --   CREATININE 0.76  --   CALCIUM 8.2*  --   MG  --  1.6*  AST 353*  --   ALT 302*  --   ALKPHOS 244*  --   BILITOT 0.3  --    ------------------------------------------------------------------------------------------------------------------  Cardiac Enzymes No results for input(s): TROPONINI in the last 168 hours. ------------------------------------------------------------------------------------------------------------------  RADIOLOGY:  Dg Chest 2 View  10/19/2015  CLINICAL DATA:  Fever today. History of current non-Hodgkin's lymphoma.  EXAM: CHEST  2 VIEW COMPARISON:  08/14/2015 FINDINGS: Right IJ Port-A-Cath has tip over the SVC. Lungs are adequately inflated without consolidation or effusion. Cardiomediastinal silhouette and remainder of the exam is unchanged. IMPRESSION: No active cardiopulmonary disease. Electronically Signed   By: Marin Olp M.D.   On: 10/19/2015 13:16   US Venous Img Lower Bilateral  10/19/2015  CLINICAL DATA:  Bilateral lower extremity swelling and pain for 2 days, left side greater than right. Lymphoma. Varicose veins. EXAM: BILATERAL LOWER EXTREMITY VENOUS DOPPLER ULTRASOUND TECHNIQUE: Gray-scale sonography with graded compression, as well as color Doppler and duplex ultrasound were performed to evaluate the lower extremity deep venous systems from the level of  the common femoral vein and including the common femoral, femoral, profunda femoral, popliteal and calf veins including the posterior tibial, peroneal and gastrocnemius veins when visible. The superficial great saphenous vein was also interrogated. Spectral Doppler was utilized to evaluate flow at rest and with distal augmentation maneuvers in the common femoral, femoral and popliteal veins. COMPARISON:  None. FINDINGS: RIGHT LOWER EXTREMITY Common Femoral Vein: No evidence of thrombus. Normal compressibility, respiratory phasicity and response to augmentation. Saphenofemoral Junction: No evidence of thrombus. Normal compressibility and flow on color Doppler imaging. Profunda Femoral Vein: No evidence of thrombus. Normal compressibility and flow on color Doppler imaging. Femoral Vein: No evidence of thrombus. Normal compressibility, respiratory phasicity and response to augmentation. Popliteal Vein: No evidence of thrombus. Normal compressibility, respiratory phasicity and response to augmentation. Calf Veins: No evidence of thrombus. Normal compressibility and flow on color Doppler imaging. Superficial Great Saphenous Vein: No evidence of thrombus. Normal compressibility and flow on color Doppler imaging. Venous Reflux:  None. Other Findings: A rounded lymph node with lack of fatty hilum is seen in the right inguinal region measuring 1.3 by 0.8 x 0.9 cm. Two small fluid collections are also seen in the right groin measuring approximately 1-2 cm at site of recent lymph node resection, consistent with small postop seromas or lymphoceles. LEFT LOWER EXTREMITY Common Femoral Vein: No evidence of thrombus. Normal compressibility, respiratory phasicity and response to augmentation. Saphenofemoral Junction: No evidence of thrombus. Normal compressibility and flow on color Doppler imaging. Profunda Femoral Vein: No evidence of thrombus. Normal compressibility and flow on color Doppler imaging. Femoral Vein: No evidence of  thrombus. Normal compressibility, respiratory phasicity and response to augmentation. Popliteal Vein: No evidence of thrombus. Normal compressibility, respiratory phasicity and response to augmentation. Calf Veins: Distended noncompressible vein is seen in the left mid calf, which is likely a branch of the peroneal vein, consistent with DVT. Other visualized calf veins are compressible and unremarkable in appearance. Superficial Great Saphenous Vein: No evidence of thrombus. Normal compressibility and flow on color Doppler imaging. Venous Reflux:  None. Other Findings:  None. IMPRESSION: Probable DVT in single left mid calf vein, which is likely a branch of the peroneal vein. No other sites of DVT identified in either lower extremity. Mildly enlarged right inguinal lymph node, consistent with history of lymphoma. Two small fluid collections in right groin at site recent lymph node resection, consistent with postop seromas or lymphoceles. Electronically Signed   By: Earle Gell M.D.   On: 10/19/2015 09:08   US Abdomen Limited Ruq  10/19/2015  CLINICAL DATA:  Elevated liver function studies, history of lymphoma ; began chemotherapy 2 weeks ago; remote history of cholecystectomy. EXAM: US ABDOMEN LIMITED - RIGHT UPPER QUADRANT COMPARISON:  PET-CT study of Sep 26, 2015 short FINDINGS: Gallbladder: No gallstones or wall  thickening visualized. No sonographic Murphy sign noted by sonographer. Common bile duct: Diameter: 9.9 mm Liver: The hepatic echotexture is normal. There is no focal mass nor ductal dilation. IMPRESSION: Common bile duct dilation not out of proportion to the post cholecystectomy state. No intraluminal stones are observed. No intrahepatic ductal dilation. No hepatic mass or other acute hepatic abnormality. Electronically Signed   By: David  Martinique M.D.   On: 10/19/2015 08:54    EKG:    IMPRESSION AND PLAN:   Verneda [name]  is a 61 y.o. female with a known history of Monoclonal B cell lymphoma  undergoing chemotherapy, thrombocytopenia, arthritis. Emergency room with complaints of left leg cramping pain. Workup showed patient possibly has DVT in the branch of peroneal wane in the calf. In the emergency room she started having chills temperature was checked she had 102.2.   1. 1. Left leg pain with cramping consistent with possible acute DVT in the popliteal vein -Given her history of cancer was started on IV heparin drip. Monitor H&H. -Patient'shemoglobin is 8.9. No history of GI bleed -Oncology consultation.  2. Elevated LFTs unclear etiology. The most recent medications patient was started was valtrex and fluconazole  -We will hold those meds -Acute hepatitis panel ordered  -Given fever in the emergency room it could be viral  -Continue to monitor LFTs and consider GI consultation if needed  -Ultrasound abdomen did not show anything acute   3. Febrile illness etiology unclear -Monitor fever curve -White count stable no indication for antibiotic at present  4. B-cell lymphoma monoclonal undergoing chemotherapy -Oncology consultation placed   All the records are revi.ewed and case discussed with ED provider. Management plans discussed with the patient, family and they are in agreement.  CODE STATUS: Full  TOTAL TIME TAKING CARE OF THIS PATIENT:45 minutes.    Chidi Shirer M.D on 10/19/2015 at 2:48 PM  Between 7am to 6pm - Pager - [phone]  After 6pm go to [email or web address] - password [email or web address] [facility]  Office  [phone]  CC: Primary care physician; Rica Mast, MD

## 2015-10-19 NOTE — ED Notes (Signed)
Resumed care from [employer].   Pt  Alert  Pt reports bilateral leg pain.  Iv meds infusing  Family with pt.  nsr on monitor.

## 2015-10-19 NOTE — Consult Note (Signed)
[facility] NOTE  Patient Care Team: Jackolyn Confer, MD as PCP - General (Internal Medicine) Cammie Sickle, MD as Consulting Physician (Internal Medicine) Seeplaputhur Robinette Haines, MD (General Surgery) [name], MD as Consulting Physician (Cardiology)  CHIEF COMPLAINTS/PURPOSE OF CONSULTATION: Diffuse large B-cell lymphoma HISTORY OF PRESENTING ILLNESS:  Stacy Murillo 60 y.o.  female pleasant patient diagnosed with diffuse large B-cell lymphoma stage IV currently status post R CHOP chemotherapy cycle #1 on May 25 was recently seen in the [facility] for multiple oral ulcers- and started on valacyclovir and Diflucan.  Patient started to notice significant cramping in the left lower extremity/calf day prior to presentation. However her oral ulcers have improved; also not completely resolved. Mild nausea no vomiting.   Ultrasound done in the emergency room shows- DVT in the peroneal vein/left calf- although not very extensive. Patient is currently started on IV heparin.   Patient denies any headaches. Denies any vision problems. Denies any pain anywhere else.  ROS: A complete 10 point review of system is done which is negative except mentioned above in history of present illness  MEDICAL HISTORY:  Past Medical History  Diagnosis Date  . Tachycardia   . Overweight(278.02)     Obesity  . Chest pain, unspecified   . Palpitations   . Osteoporosis   . Thrombocytopenia ([place])   . 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 Diverticulosis 07/06/15  . Esophagitis 07/06/15  . Gastritis 07/06/15  . 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 GERD (gastroesophageal reflux disease)   . Fatigue   . Leg cramps   . Night sweats   . Cancer ([place])   . Dysrhythmia   . Shortness of breath dyspnea   . Arthritis   . Blood dyscrasia     SURGICAL HISTORY: Past Surgical History  Procedure Laterality Date  . Abdominal hysterectomy  2005  . Bone marrow biopsy   09/14/15  . Colonoscopy  07/05/2014  . Esophagogastroduodenoscopy  07/05/2014  . Portacath placement Right   . Peripheral vascular catheterization N/A 09/27/2015    Procedure: Glori Luis Cath Insertion;  Surgeon: Algernon Huxley, MD;  Location: [facility];  Service: Cardiovascular;  Laterality: N/A;  . Cholecystectomy  1997  . Inguinal lymph node biopsy Right 10/05/2015    Procedure: INGUINAL LYMPH NODE BIOPSY;  Surgeon: Christene Lye, MD;  Location: [facility];  Service: General;  Laterality: Right;    SOCIAL HISTORY: Social History   Social History  . Marital Status: Married    Spouse Name: N/A  . Number of Children: N/A  . Years of Education: N/A   Occupational History  . Not on file.   Social History Main Topics  . Smoking status: Never Smoker   . Smokeless tobacco: Never Used  . Alcohol Use: No  . Drug Use: No  . Sexual Activity: Not on file   Other Topics Concern  . Not on file   Social History Narrative   Married   Does not get regular exercise    FAMILY HISTORY: Family History  Problem Relation Age of Onset  . Diabetes Neg Hx   . Heart disease Father     CABG x 4  . Hypertension Mother   . Pancreatic cancer Mother   . Ulcers Mother   . Asthma Mother     ALLERGIES:  has No Known Allergies.  MEDICATIONS:  Current Facility-Administered Medications  Medication Dose Route Frequency Provider Last Rate  Last Dose  . heparin ADULT infusion 100 units/mL (25000 units/222m sodium chloride 0.45%)  1,100 Units/hr Intravenous Continuous DWandra Arthurs MD 11 mL/hr at 10/19/15 1329 1,100 Units/hr at 10/19/15 1329   Current Outpatient Prescriptions  Medication Sig Dispense Refill  . butalbital-acetaminophen-caffeine (FIORICET) 50-325-40 MG tablet Take 1-2 tablets by mouth every 6 (six) hours as needed for headache. 20 tablet 0  . diazepam (VALIUM) 5 MG tablet Take 5 mg by mouth every 8 (eight) hours as needed (for vertigo).    .Marland Kitchenlidocaine-prilocaine (EMLA) cream  Apply 1 application topically as needed (prior to accessing port).    . ondansetron (ZOFRAN) 8 MG tablet Take 8 mg by mouth every 8 (eight) hours as needed for nausea or vomiting.    .Marland KitchenoxyCODONE-acetaminophen (PERCOCET) 7.5-325 MG tablet Take 1 tablet by mouth every 6 (six) hours as needed for severe pain. 60 tablet 0  . predniSONE (DELTASONE) 50 MG tablet Take 100 mg by mouth daily with breakfast. Pt takes for five days starting the day of chemo.    . prochlorperazine (COMPAZINE) 10 MG tablet Take 1 tablet (10 mg total) by mouth every 6 (six) hours as needed for nausea or vomiting. 40 tablet 1  . ranitidine (ZANTAC) 150 MG tablet Take 150 mg by mouth 2 (two) times daily.     . valACYclovir (VALTREX) 500 MG tablet Take 1 tablet (500 mg total) by mouth 3 (three) times daily. 90 tablet 3   Facility-Administered Medications Ordered in Other Encounters  Medication Dose Route Frequency Provider Last Rate Last Dose  . 0.9 %  sodium chloride infusion   Intravenous Continuous LEvlyn Kanner NP   Stopped at 10/14/15 1312      .  PHYSICAL EXAMINATION:   Filed Vitals:   10/19/15 1800 10/19/15 1830  BP: 135/68 131/66  Pulse: 96 92  Temp:    Resp: 20 21   There were no vitals filed for this visit.  GENERAL: Well-nourished well-developed; Alert, no distress and mild discomfortable. Accompanied by her husband. Appears fatigued.  EYES: no pallor or icterus OROPHARYNX: no thrush; good dentition; ~0.5cm ulcer roof of mouth NECK: supple, no masses felt LYMPH: no palpable lymphadenopathy in the cervical, axillary; 1 to 2 cm lymph node felt in the right inguinal region. LUNGS: clear to auscultation and No wheeze or crackles HEART/CVS: regular rate & rhythm and no murmurs; positive for pain in the left calf.  ABDOMEN: abdomen soft, non-tender and normal bowel sounds; positive for spleen.  Musculoskeletal:no cyanosis of digits and no clubbing  PSYCH: alert & oriented x 3 with fluent  speech NEURO: no focal motor/sensory deficits;  SKIN: Blisters noted in the upper lip.   LABORATORY DATA:  I have reviewed the data as listed Lab Results  Component Value Date   WBC 8.3 10/19/2015   HGB 8.9* 10/19/2015   HCT 26.8* 10/19/2015   MCV 88.4 10/19/2015   PLT 153 10/19/2015    Recent Labs  09/30/15 0808 10/03/15 1217 10/14/15 1114 10/19/15 0653  NA 140 136 137 136  K 4.2 4.9 4.1 3.5  CL 101 99* 100* 103  CO2 22 18* 29 25  GLUCOSE 97 117* 109* 121*  BUN 16 17 10 7   CREATININE 0.82 0.81 0.71 0.76  CALCIUM 9.6 10.1 9.0 8.2*  GFRNONAA >60 >60 >60 >60  GFRAA >60 >60 >60 >60  PROT 6.6 6.6  --  5.9*  ALBUMIN 4.4 4.4  --  3.3*  AST 33 48*  --  353*  ALT 30 61*  --  302*  ALKPHOS 99 146*  --  244*  BILITOT 0.6 0.7  --  0.3    RADIOGRAPHIC STUDIES: I have personally reviewed the radiological images as listed and agreed with the findings in the report. Dg Chest 2 View  10/19/2015  CLINICAL DATA:  Fever today. History of current non-Hodgkin's lymphoma. EXAM: CHEST  2 VIEW COMPARISON:  08/14/2015 FINDINGS: Right IJ Port-A-Cath has tip over the SVC. Lungs are adequately inflated without consolidation or effusion. Cardiomediastinal silhouette and remainder of the exam is unchanged. IMPRESSION: No active cardiopulmonary disease. Electronically Signed   By: Marin Olp M.D.   On: 10/19/2015 13:16   Ct Head Wo Contrast  09/30/2015  CLINICAL DATA:  Non-Hodgkin's lymphoma. Lumbar puncture 1 day prior. Headache. EXAM: CT HEAD WITHOUT CONTRAST TECHNIQUE: Contiguous axial images were obtained from the base of the skull through the vertex without intravenous contrast. COMPARISON:  None. FINDINGS: No acute intracranial hemorrhage. No focal mass lesion. No CT evidence of acute infarction. No midline shift or mass effect. No hydrocephalus. Basilar cisterns are patent. Paranasal sinuses and mastoid air cells are clear. Orbits are clear. IMPRESSION: No acute intracranial findings.   Normal ventricular volume. Electronically Signed   By: Suzy Bouchard M.D.   On: 09/30/2015 09:31   Mr Jeri Cos ZD Contrast  09/26/2015  CLINICAL DATA:  Large B-cell lymphoma, recently diagnosed. EXAM: MRI HEAD WITHOUT AND WITH CONTRAST TECHNIQUE: Multiplanar, multiecho pulse sequences of the brain and surrounding structures were obtained without and with intravenous contrast. CONTRAST:  60m MULTIHANCE GADOBENATE DIMEGLUMINE 529 MG/ML IV SOLN COMPARISON:  None. FINDINGS: There is no evidence of acute infarct, intracranial hemorrhage, mass, midline shift, or extra-axial fluid collection. Ventricles and sulci are normal. No significant cerebral white matter disease is seen for age. No abnormal brain parenchymal or meningeal enhancement is identified. Orbits are unremarkable. No significant paranasal sinus or mastoid inflammatory disease is seen. Major intracranial vascular flow voids are preserved. IMPRESSION: Unremarkable appearance of the brain for age. No evidence of intracranial lymphoma. Electronically Signed   By: ALogan BoresM.D.   On: 09/26/2015 11:21   Nm Myocar Multi W/spect W/wall Motion / Ef  10/03/2015   T wave inversion was noted during stress in the II and III leads, beginning at 1 minutes of stress, ending at 2 minutes of stress.  There was no ST segment deviation noted during stress.  The study is normal.  This is a low risk study.  The left ventricular ejection fraction is normal (55-65%).    Nm Pet Image Initial (pi) Skull Base To Thigh  09/26/2015  CLINICAL DATA:  Initial treatment strategy for large B-cell lymphoma. EXAM: NUCLEAR MEDICINE PET SKULL BASE TO THIGH TECHNIQUE: 11.5 mCi F-18 FDG was injected intravenously. Full-ring PET imaging was performed from the skull base to thigh after the radiotracer. CT data was obtained and used for attenuation correction and anatomic localization. FASTING BLOOD GLUCOSE:  Value: 77 mg/dl COMPARISON:  CT chest abdomen pelvis 09/09/2015.  FINDINGS: NECK There is mild asymmetric hypermetabolism in the right nasopharynx, which may be tonsillar in origin. Difficult to exclude lymphomatous involvement. Numerous hypermetabolic lymph nodes are seen throughout the neck bilaterally. Index right level 5 lymph node measures 1.4 cm (CT image 43) with an SUV max of 14.0. CT images show no acute findings. CHEST Hypermetabolic supraclavicular, mediastinal, hilar, subpectoral and axillary adenopathy is seen. Index low right supraclavicular lymph node measures 12 mm (CT image 57)  with an SUV max of 16.5. Right subpectoral lymph node measures 11 mm (CT image 69) with an SUV of 11.1. Subcarinal lymph node measures approximately 11 mm with an SUV max of 7.3. No hypermetabolic pulmonary nodules. CT images of show no pericardial or pleural effusion. ABDOMEN/PELVIS Spleen is enlarged and markedly hypermetabolic, with an SUV max of 21.2. There are hypermetabolic porta hepatis, abdominal peritoneal ligament, mesenteric and retroperitoneal lymph nodes. Index porta hepatis lymph node measures approximately 10 mm (CT image 135) with an SUV max of 6.0. Index aortocaval lymph node measures 9 mm (CT image 156) with an SUV max of 7.8. Hypermetabolic lymph nodes extend along the abdominal aorta, iliac chains and inguinal regions bilaterally. Index right external iliac lymph node measures 12 mm (CT image 206) with an SUV max of 12.3. No abnormal hypermetabolism in the liver, adrenal glands or pancreas. CT images show the liver to be grossly unremarkable. Cholecystectomy. Adrenal glands, kidneys, pancreas, stomach and bowel are grossly unremarkable. No free fluid. SKELETON There is diffuse osseous hypermetabolism. IMPRESSION: 1. Hypermetabolic adenopathy throughout the neck, chest, abdomen and pelvis with splenic enlargement/hypermetabolism and diffuse osseous hypermetabolism. Findings are consistent with the given diagnosis of lymphoma. 2. Mild hypermetabolism within asymmetric  right nasopharyngeal soft tissue, which may be tonsillar in origin. Difficult to exclude lymphomatous involvement. Continued attention on followup exams is warranted. Electronically Signed   By: Lorin Picket M.D.   On: 09/26/2015 11:07   US Venous Img Lower Bilateral  10/19/2015  CLINICAL DATA:  Bilateral lower extremity swelling and pain for 2 days, left side greater than right. Lymphoma. Varicose veins. EXAM: BILATERAL LOWER EXTREMITY VENOUS DOPPLER ULTRASOUND TECHNIQUE: Gray-scale sonography with graded compression, as well as color Doppler and duplex ultrasound were performed to evaluate the lower extremity deep venous systems from the level of the common femoral vein and including the common femoral, femoral, profunda femoral, popliteal and calf veins including the posterior tibial, peroneal and gastrocnemius veins when visible. The superficial great saphenous vein was also interrogated. Spectral Doppler was utilized to evaluate flow at rest and with distal augmentation maneuvers in the common femoral, femoral and popliteal veins. COMPARISON:  None. FINDINGS: RIGHT LOWER EXTREMITY Common Femoral Vein: No evidence of thrombus. Normal compressibility, respiratory phasicity and response to augmentation. Saphenofemoral Junction: No evidence of thrombus. Normal compressibility and flow on color Doppler imaging. Profunda Femoral Vein: No evidence of thrombus. Normal compressibility and flow on color Doppler imaging. Femoral Vein: No evidence of thrombus. Normal compressibility, respiratory phasicity and response to augmentation. Popliteal Vein: No evidence of thrombus. Normal compressibility, respiratory phasicity and response to augmentation. Calf Veins: No evidence of thrombus. Normal compressibility and flow on color Doppler imaging. Superficial Great Saphenous Vein: No evidence of thrombus. Normal compressibility and flow on color Doppler imaging. Venous Reflux:  None. Other Findings: A rounded lymph node  with lack of fatty hilum is seen in the right inguinal region measuring 1.3 by 0.8 x 0.9 cm. Two small fluid collections are also seen in the right groin measuring approximately 1-2 cm at site of recent lymph node resection, consistent with small postop seromas or lymphoceles. LEFT LOWER EXTREMITY Common Femoral Vein: No evidence of thrombus. Normal compressibility, respiratory phasicity and response to augmentation. Saphenofemoral Junction: No evidence of thrombus. Normal compressibility and flow on color Doppler imaging. Profunda Femoral Vein: No evidence of thrombus. Normal compressibility and flow on color Doppler imaging. Femoral Vein: No evidence of thrombus. Normal compressibility, respiratory phasicity and response to augmentation. Popliteal Vein: No  evidence of thrombus. Normal compressibility, respiratory phasicity and response to augmentation. Calf Veins: Distended noncompressible vein is seen in the left mid calf, which is likely a branch of the peroneal vein, consistent with DVT. Other visualized calf veins are compressible and unremarkable in appearance. Superficial Great Saphenous Vein: No evidence of thrombus. Normal compressibility and flow on color Doppler imaging. Venous Reflux:  None. Other Findings:  None. IMPRESSION: Probable DVT in single left mid calf vein, which is likely a branch of the peroneal vein. No other sites of DVT identified in either lower extremity. Mildly enlarged right inguinal lymph node, consistent with history of lymphoma. Two small fluid collections in right groin at site recent lymph node resection, consistent with postop seromas or lymphoceles. Electronically Signed   By: Earle Gell M.D.   On: 10/19/2015 09:08   Mm Screening Breast Tomo Bilateral  09/20/2015  CLINICAL DATA:  Screening. EXAM: 2D DIGITAL SCREENING BILATERAL MAMMOGRAM WITH CAD AND ADJUNCT TOMO COMPARISON:  Previous exam(s). ACR Breast Density Category b: There are scattered areas of fibroglandular  density. FINDINGS: There are no findings suspicious for malignancy. Images were processed with CAD. IMPRESSION: No mammographic evidence of malignancy. A result letter of this screening mammogram will be mailed directly to the patient. RECOMMENDATION: Screening mammogram in one year. (Code:SM-B-01Y) BI-RADS CATEGORY  1: Negative. Electronically Signed   By: Dorise Bullion III M.D   On: 09/20/2015 17:46   US Abdomen Limited Ruq  10/19/2015  CLINICAL DATA:  Elevated liver function studies, history of lymphoma ; began chemotherapy 2 weeks ago; remote history of cholecystectomy. EXAM: US ABDOMEN LIMITED - RIGHT UPPER QUADRANT COMPARISON:  PET-CT study of Sep 26, 2015 short FINDINGS: Gallbladder: No gallstones or wall thickening visualized. No sonographic Murphy sign noted by sonographer. Common bile duct: Diameter: 9.9 mm Liver: The hepatic echotexture is normal. There is no focal mass nor ductal dilation. IMPRESSION: Common bile duct dilation not out of proportion to the post cholecystectomy state. No intraluminal stones are observed. No intrahepatic ductal dilation. No hepatic mass or other acute hepatic abnormality. Electronically Signed   By: David  Martinique M.D.   On: 10/19/2015 08:54   Dg Fluoro Guided Loc Of Needle/cath Tip For Spinal Inject Lt  09/28/2015  CLINICAL DATA:  Lymphoma EXAM: DIAGNOSTIC LUMBAR PUNCTURE UNDER FLUOROSCOPIC GUIDANCE FLUOROSCOPY TIME:  7 minutes and 48 seconds. PROCEDURE: Informed consent was obtained from the patient prior to the procedure, including potential complications of headache, allergy, and pain. With the patient prone, the lower back was prepped with Betadine. Initial attempts at lumbar puncture were performed by Dr. David Martinique. This was unsuccessful. Subsequently, I attempted lumbar puncture. The skin overlying the lumbar region was prepped and draped in a sterile fashion. 1% lidocaine was utilized for local anesthesia. Initially, a 20 gauge needle was advanced into the  CSF space via left paramedian approach at L2-3. This yielded no fluid. Subsequently, needle placement at L2-3 on the right was performed. Two drops of serosanguineous fluid was obtained. FINDINGS: Image documents needle placement in the CSF space on the right at L2-3. COMPLICATIONS: None IMPRESSION: Lumbar puncture was performed. It was a very difficult procedure and only 2 drops serosanguineous fluid was obtained. Electronically Signed   By: Marybelle Killings M.D.   On: 09/28/2015 13:17    ASSESSMENT & PLAN:   # 60 year old female patient with diffuse large B-cell lymphoma status currently on R CHOP chemotherapy-currently admitted to the hospital for cramping in the left lower extremity; also noted to  have elevated LFTs  # Left lower extremity cramping/pain- ultrasound shows DVT in the peroneal vein/left mid calf which is in fact is not very extensive on ultrasound. However in absence of any other explanation for the pain/cramping- I think IV heparin; and with improvement transition to oral anticoagulation like Xarelto is reasonable.  # Elevated LFTs AST/ALT- 300s; normal bilirubin; normal ultrasound of the abdomen. Question related to medication- valacyclovir/Diflucan. Currently on hold.  # Diffuse large B-cell lymphoma- I reviewed the pathology with the patient/husband. Based upon improvement of the platelet count/decline in the LDH-I think patient is already responding to chemotherapy.  # Oral sores from chemotherapy- would recommend acyclovir prophylaxis; after LFTs improve.  The above plan of care was discussed with the patient and husband in detail. Also discussed with Dr. Posey Pronto.  Thank you Dr. Posey Pronto for allowing me to participate in the care of your pleasant patient. Please do not hesitate to contact me with questions or concerns in the interim.     Cammie Sickle, MD 10/19/2015 7:28 PM

## 2015-10-19 NOTE — ED Notes (Signed)
Pt to registration with limping gait; st chemo pt and since yesterday began having leg/back pain; st concerned over reaction to chemo

## 2015-10-19 NOTE — ED Provider Notes (Signed)
CSN: [id number]     Arrival date & time 10/19/15  3846 History   First MD Initiated Contact with Patient 10/19/15 [phone]     Chief Complaint  Patient presents with  . Leg Pain     (Consider location/radiation/quality/duration/timing/severity/associated sxs/prior Treatment) The history is provided by the patient.  Stacy Murillo is a 60 y.o. 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 Bilateral lower leg cramps for the last 2 days. States that when the cramps come on she is unable to walk. Denies any chest pain or shortness of breath. States that last chemotherapy was 2 weeks ago and has saw oncology already 5 days ago with cold sores and is currently on Valtrex and states that the cold sores have improved. Has some chills but denies any fevers and was noted to have low grade temp 99 in the ED. Denies cough or abdominal pain or urinary symptoms. Denies hx of DVT or PE.  Took fioricet this morning and felt better.    Past Medical History  Diagnosis Date  . Tachycardia   . Overweight(278.02)     Obesity  . Chest pain, unspecified   . Palpitations   . Osteoporosis   . Thrombocytopenia ([place])   . Lymphoma ([place]) 09/14/15    Monoclonal B cell lymphoma  . Diverticulosis 07/06/15  . Esophagitis 07/06/15  . Gastritis 07/06/15  . 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 GERD (gastroesophageal reflux disease)   . Fatigue   . Leg cramps   . Night sweats   . Cancer ([place])   . Dysrhythmia   . Shortness of breath dyspnea   . Arthritis   . Blood dyscrasia    Past Surgical History  Procedure Laterality Date  . Abdominal hysterectomy  2005  . Bone marrow biopsy  09/14/15  . Colonoscopy  07/05/2014  . Esophagogastroduodenoscopy  07/05/2014  . Portacath placement Right   . Peripheral vascular catheterization N/A 09/27/2015    Procedure: Glori Luis Cath Insertion;  Surgeon: Algernon Huxley, MD;  Location: [facility];  Service: Cardiovascular;   Laterality: N/A;  . Cholecystectomy  1997  . Inguinal lymph node biopsy Right 10/05/2015    Procedure: INGUINAL LYMPH NODE BIOPSY;  Surgeon: Christene Lye, MD;  Location: [facility];  Service: General;  Laterality: Right;   Family History  Problem Relation Age of Onset  . Diabetes Neg Hx   . Heart disease Father     CABG x 4  . Hypertension Mother   . Pancreatic cancer Mother   . Ulcers Mother   . Asthma Mother    Social History  Substance Use Topics  . Smoking status: Never Smoker   . Smokeless tobacco: Never Used  . Alcohol Use: No   OB History    No data available     Review of Systems  Musculoskeletal:       Leg pain   All other systems reviewed and are negative.     Allergies  Review of patient's allergies indicates no known allergies.  Home Medications   Prior to Admission medications   Medication Sig Start Date End Date Taking? Authorizing Provider  Alum Hydroxide-Mag Carbonate (GAVISCON PO) Take by mouth.    Historical Provider, MD  butalbital-acetaminophen-caffeine (FIORICET) [phone] MG tablet Take 1-2 tablets by mouth every 6 (six) hours as needed for headache. 09/30/15 09/29/16  Earleen Newport, MD  Cholecalciferol ([employer] VIT D 5000 HIGH-POTENCY PO) Take 1  tablet by mouth daily.     Historical Provider, MD  diazepam (VALIUM) 5 MG tablet Take 1 tablet (5 mg total) by mouth every 8 (eight) hours as needed (vertigo). 09/30/15   Earleen Newport, MD  fluconazole (DIFLUCAN) 100 MG tablet Take 1 tablet (100 mg total) by mouth daily. 10/14/15   Evlyn Kanner, NP  lidocaine (XYLOCAINE) 2 % solution Use as directed 10 mLs in the mouth or throat every 4 (four) hours as needed for mouth pain (30 min prior to eating). 10/14/15   Evlyn Kanner, NP  lidocaine-prilocaine (EMLA) cream Apply 1 application topically as needed. Apply generously over the Mediport 45 minutes prior to chemotherapy. 10/03/15   Cammie Sickle, MD  magic mouthwash SOLN Take 5 mLs by  mouth 4 (four) times daily. 10/13/15   Cammie Sickle, MD  nystatin (MYCOSTATIN) 100000 UNIT/ML suspension Take 5 mLs (500,000 Units total) by mouth 4 (four) times daily. 10/14/15   Evlyn Kanner, NP  ondansetron (ZOFRAN) 8 MG tablet Take 1 tablet (8 mg total) by mouth every 8 (eight) hours as needed for nausea or vomiting (start 3 days; after chemo). 10/03/15   Cammie Sickle, MD  oxyCODONE-acetaminophen (PERCOCET) 7.5-325 MG tablet Take 1 tablet by mouth every 6 (six) hours as needed for severe pain. 10/03/15   Cammie Sickle, MD  predniSONE (DELTASONE) 50 MG tablet Take 2 tablets once day x 5 days. START on day of your chemotherapy. Take with food. 10/03/15   Cammie Sickle, MD  Probiotic Product (ALIGN PO) Take by mouth.    Historical Provider, MD  prochlorperazine (COMPAZINE) 10 MG tablet Take 1 tablet (10 mg total) by mouth every 6 (six) hours as needed for nausea or vomiting. 10/03/15   Cammie Sickle, MD  ranitidine (ZANTAC) 150 MG tablet Take 150 mg by mouth 2 (two) times daily.     Historical Provider, MD  valACYclovir (VALTREX) 500 MG tablet Take 1 tablet (500 mg total) by mouth 3 (three) times daily. 10/14/15   Evlyn Kanner, NP   BP 139/72 mmHg  Pulse 98  Temp(Src) 102.1 F (38.9 C) (Oral)  Resp 20  SpO2 97% Physical Exam  Constitutional: She is oriented to person, place, and time.  Slightly anxious   HENT:  Head: Normocephalic.  Mouth/Throat: Oropharynx is clear and moist.  Cold sore on lower lip and upper palate (present several days ago, improved per patient)   Eyes: Conjunctivae are normal. Pupils are equal, round, and reactive to light.  Neck: Normal range of motion. Neck supple.  Cardiovascular: Normal rate, regular rhythm and normal heart sounds.   Pulmonary/Chest: Effort normal and breath sounds normal. No respiratory distress. She has no wheezes. She has no rales.  Abdominal: Soft. Bowel sounds are normal. She exhibits no distension. There is  no tenderness. There is no rebound.  Musculoskeletal: Normal range of motion.  Diffuse bilateral calf tenderness, worse on left side. No pedal edema   Neurological: She is alert and oriented to person, place, and time.  Skin: Skin is warm and dry.  Psychiatric: She has a normal mood and affect. Her behavior is normal. Judgment and thought content normal.  Nursing note and vitals reviewed.   ED Course  Procedures (including critical care time)  CRITICAL CARE Performed by: Darl Householder, DAVID   Total critical care time: 30 minutes  Critical care time was exclusive of separately billable procedures and treating other patients.  Critical care was necessary to  treat or prevent imminent or life-threatening deterioration.  Critical care was time spent personally by me on the following activities: development of treatment plan with patient and/or surrogate as well as nursing, discussions with consultants, evaluation of patient's response to treatment, examination of patient, obtaining history from patient or surrogate, ordering and performing treatments and interventions, ordering and review of laboratory studies, ordering and review of radiographic studies, pulse oximetry and re-evaluation of patient's condition.   Labs Review Labs Reviewed  CBC WITH DIFFERENTIAL/PLATELET - Abnormal; Notable for the following:    RBC 3.03 (*)    Hemoglobin 8.9 (*)    HCT 26.8 (*)    RDW 16.2 (*)    nRBC 1 (*)    Neutro Abs 6.8 (*)    Lymphs Abs 0.6 (*)    All other components within normal limits  COMPREHENSIVE METABOLIC PANEL - Abnormal; Notable for the following:    Glucose, Bld 121 (*)    Calcium 8.2 (*)    Total Protein 5.9 (*)    Albumin 3.3 (*)    AST 353 (*)    ALT 302 (*)    Alkaline Phosphatase 244 (*)    All other components within normal limits  CK - Abnormal; Notable for the following:    Total CK 11 (*)    All other components within normal limits  LACTATE DEHYDROGENASE - Abnormal;  Notable for the following:    LDH 319 (*)    All other components within normal limits  MAGNESIUM - Abnormal; Notable for the following:    Magnesium 1.6 (*)    All other components within normal limits  URINALYSIS COMPLETEWITH MICROSCOPIC ([facility] ONLY) - Abnormal; Notable for the following:    Color, Urine STRAW (*)    APPearance CLEAR (*)    All other components within normal limits  URINE CULTURE  CULTURE, BLOOD (ROUTINE X 2)  CULTURE, BLOOD (ROUTINE X 2)  LACTIC ACID, PLASMA  HEPATITIS PANEL, ACUTE    Imaging Review US Venous Img Lower Bilateral  10/19/2015  CLINICAL DATA:  Bilateral lower extremity swelling and pain for 2 days, left side greater than right. Lymphoma. Varicose veins. EXAM: BILATERAL LOWER EXTREMITY VENOUS DOPPLER ULTRASOUND TECHNIQUE: Gray-scale sonography with graded compression, as well as color Doppler and duplex ultrasound were performed to evaluate the lower extremity deep venous systems from the level of the common femoral vein and including the common femoral, femoral, profunda femoral, popliteal and calf veins including the posterior tibial, peroneal and gastrocnemius veins when visible. The superficial great saphenous vein was also interrogated. Spectral Doppler was utilized to evaluate flow at rest and with distal augmentation maneuvers in the common femoral, femoral and popliteal veins. COMPARISON:  None. FINDINGS: RIGHT LOWER EXTREMITY Common Femoral Vein: No evidence of thrombus. Normal compressibility, respiratory phasicity and response to augmentation. Saphenofemoral Junction: No evidence of thrombus. Normal compressibility and flow on color Doppler imaging. Profunda Femoral Vein: No evidence of thrombus. Normal compressibility and flow on color Doppler imaging. Femoral Vein: No evidence of thrombus. Normal compressibility, respiratory phasicity and response to augmentation. Popliteal Vein: No evidence of thrombus. Normal compressibility, respiratory phasicity and  response to augmentation. Calf Veins: No evidence of thrombus. Normal compressibility and flow on color Doppler imaging. Superficial Great Saphenous Vein: No evidence of thrombus. Normal compressibility and flow on color Doppler imaging. Venous Reflux:  None. Other Findings: A rounded lymph node with lack of fatty hilum is seen in the right inguinal region measuring 1.3 by 0.8 x 0.9 cm.  Two small fluid collections are also seen in the right groin measuring approximately 1-2 cm at site of recent lymph node resection, consistent with small postop seromas or lymphoceles. LEFT LOWER EXTREMITY Common Femoral Vein: No evidence of thrombus. Normal compressibility, respiratory phasicity and response to augmentation. Saphenofemoral Junction: No evidence of thrombus. Normal compressibility and flow on color Doppler imaging. Profunda Femoral Vein: No evidence of thrombus. Normal compressibility and flow on color Doppler imaging. Femoral Vein: No evidence of thrombus. Normal compressibility, respiratory phasicity and response to augmentation. Popliteal Vein: No evidence of thrombus. Normal compressibility, respiratory phasicity and response to augmentation. Calf Veins: Distended noncompressible vein is seen in the left mid calf, which is likely a branch of the peroneal vein, consistent with DVT. Other visualized calf veins are compressible and unremarkable in appearance. Superficial Great Saphenous Vein: No evidence of thrombus. Normal compressibility and flow on color Doppler imaging. Venous Reflux:  None. Other Findings:  None. IMPRESSION: Probable DVT in single left mid calf vein, which is likely a branch of the peroneal vein. No other sites of DVT identified in either lower extremity. Mildly enlarged right inguinal lymph node, consistent with history of lymphoma. Two small fluid collections in right groin at site recent lymph node resection, consistent with postop seromas or lymphoceles. Electronically Signed   By: Earle Gell M.D.   On: 10/19/2015 09:08   US Abdomen Limited Ruq  10/19/2015  CLINICAL DATA:  Elevated liver function studies, history of lymphoma ; began chemotherapy 2 weeks ago; remote history of cholecystectomy. EXAM: US ABDOMEN LIMITED - RIGHT UPPER QUADRANT COMPARISON:  PET-CT study of Sep 26, 2015 short FINDINGS: Gallbladder: No gallstones or wall thickening visualized. No sonographic Murphy sign noted by sonographer. Common bile duct: Diameter: 9.9 mm Liver: The hepatic echotexture is normal. There is no focal mass nor ductal dilation. IMPRESSION: Common bile duct dilation not out of proportion to the post cholecystectomy state. No intraluminal stones are observed. No intrahepatic ductal dilation. No hepatic mass or other acute hepatic abnormality. Electronically Signed   By: David  Martinique M.D.   On: 10/19/2015 08:54   I have personally reviewed and evaluated these images and lab results as part of my medical decision-making.   EKG Interpretation None      MDM   Final diagnoses:  Elevated LFTs   Stacy Murillo is a 60 y.o. female here with leg cramps, last chemo 2 weeks ago. Not sure why she has cramps 2 weeks after chemo, I doubt a delayed reaction to chemo. Consider myositis vs DVT vs viral. Will get labs, CK, DVT study. Will give IVF and reassess.   10 am US showed possible DVT. But Hg dropped to 8.9 from 13 earlier last month. LFTs elevated but US unremarkable. CK nl. Called Dr. Karyl Kinnier regarding anticoagulation and follow up. He wants to hold off on oral anticoagulation for now (given hg drop) and patient has appointment with him tomorrow. Recommend check Mg level and LDH.   1:01 PM Spiked temp 102 in the ED. Added on lactate, cultures. Called Dr. Karyl Kinnier again. Will heparinize and trend CBC in the hospital. No obvious source of fever so will hold off on abx.   Wandra Arthurs, MD 10/19/15 1302

## 2015-10-19 NOTE — ED Notes (Signed)
Patient transported to X-ray 

## 2015-10-19 NOTE — ED Notes (Signed)
MD at bedside.  Dr. Darl Householder in room to assess patient.

## 2015-10-19 NOTE — ED Notes (Signed)
Patient taking valtrex from home for her mouth sores.  Dr. Posey Pronto aware and approved this.

## 2015-10-19 NOTE — ED Notes (Signed)
Patient returned from US.

## 2015-10-19 NOTE — ED Notes (Signed)
Patient transported to Ultrasound 

## 2015-10-19 NOTE — Progress Notes (Signed)
ANTICOAGULATION CONSULT NOTE - Initial Consult  Pharmacy Consult for Heparin Indication: DVT  No Known Allergies  Patient Measurements: Height: 5\' 6"  (167.6 cm) Weight: 160 lb 8 oz (72.802 kg) IBW/kg (Calculated) : 59.3 Heparin Dosing Weight: 65 kg  Vital Signs: Temp: 98.4 F (36.9 C) (06/07 2118) Temp Source: Oral (06/07 2118) BP: 134/64 mmHg (06/07 2118) Pulse Rate: 94 (06/07 2118)  Labs:  Recent Labs  10/19/15 0653 10/19/15 1338 10/19/15 2020  HGB 8.9*  --   --   HCT 26.8*  --   --   PLT 153  --   --   APTT  --  >160*  --   LABPROT  --  15.9*  --   INR  --  1.26  --   HEPARINUNFRC  --   --  0.33  CREATININE 0.76  --   --   CKTOTAL 11*  --   --     Estimated Creatinine Clearance: 75.4 mL/min (by C-G formula based on Cr of 0.76).   Medical History: Past Medical History  Diagnosis Date  . Tachycardia   . Overweight(278.02)     Obesity  . Chest pain, unspecified   . Palpitations   . Osteoporosis   . Thrombocytopenia ([place])   . Lymphoma ([place]) 09/14/15    Monoclonal B cell lymphoma  . Diverticulosis 07/06/15  . Esophagitis 07/06/15  . Gastritis 07/06/15  . 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 GERD (gastroesophageal reflux disease)   . Fatigue   . Leg cramps   . Night sweats   . Cancer ([place])   . Dysrhythmia   . Shortness of breath dyspnea   . Arthritis   . Blood dyscrasia     Medications:  Prescriptions prior to admission  Medication Sig Dispense Refill Last Dose  . butalbital-acetaminophen-caffeine (FIORICET) 50-325-40 MG tablet Take 1-2 tablets by mouth every 6 (six) hours as needed for headache. 20 tablet 0 10[facility]18/2015 [facility]   . diazepam (VALIUM) 5 MG tablet Take 5 mg by mouth every 8 (eight) hours as needed (for vertigo).   10/19/2015 at Unknown time  . lidocaine-prilocaine (EMLA) cream Apply 1 application topically as needed (prior to accessing port).   10/19/2015 at Unknown time  . ondansetron (ZOFRAN) 8 MG tablet Take 8 mg by mouth every 8 (eight) hours  as needed for nausea or vomiting.   10/18/2015 at Unknown time  . oxyCODONE-acetaminophen (PERCOCET) 7.5-325 MG tablet Take 1 tablet by mouth every 6 (six) hours as needed for severe pain. 60 tablet 0 Past Week at Unknown time  . predniSONE (DELTASONE) 50 MG tablet Take 100 mg by mouth daily with breakfast. Pt takes for five days starting the day of chemo.   Past Month at Unknown time  . prochlorperazine (COMPAZINE) 10 MG tablet Take 1 tablet (10 mg total) by mouth every 6 (six) hours as needed for nausea or vomiting. 40 tablet 1 10/19/2015 at Unknown time  . ranitidine (ZANTAC) 150 MG tablet Take 150 mg by mouth 2 (two) times daily.    10/19/2015 at Unknown time  . valACYclovir (VALTREX) 500 MG tablet Take 1 tablet (500 mg total) by mouth 3 (three) times daily. 90 tablet 3 10/19/2015 at Unknown time   Scheduled:   Infusions:  . heparin 1,100 Units/hr (10/19/15 1329)    Assessment: 60 y/o F with a h/o lymphoma on chemotherapy admitted with LE DVT. Patient not on anticoagulants PTA per history and per patient.   Goal of Therapy:  Heparin level  0.3-0.7 units/ml Monitor platelets by anticoagulation protocol: Yes   Plan:  Give 4000 units bolus x 1 Start heparin infusion at 1100 units/hr Check anti-Xa level in 6 hours and daily while on heparin Continue to monitor H&H and platelets   6/7:  HL @ 20:30 = 0.33 Will continue this pt on current rate of 1100 units/hr and recheck HL in 6 hrs on 6/8 @ 2:30.   Virgin Zellers D 10/19/2015,9:33 PM

## 2015-10-19 NOTE — ED Notes (Signed)
Dr. Darl Householder in room to give update.

## 2015-10-20 ENCOUNTER — Other Ambulatory Visit: Payer: Self-pay | Admitting: *Deleted

## 2015-10-20 ENCOUNTER — Inpatient Hospital Stay: Payer: BLUE CROSS/BLUE SHIELD | Admitting: Internal Medicine

## 2015-10-20 ENCOUNTER — Inpatient Hospital Stay: Payer: BLUE CROSS/BLUE SHIELD

## 2015-10-20 ENCOUNTER — Telehealth: Payer: Self-pay | Admitting: *Deleted

## 2015-10-20 DIAGNOSIS — K219 Gastro-esophageal reflux disease without esophagitis: Secondary | ICD-10-CM | POA: Diagnosis not present

## 2015-10-20 DIAGNOSIS — C8521 Mediastinal (thymic) large B-cell lymphoma, lymph nodes of head, face, and neck: Secondary | ICD-10-CM | POA: Diagnosis not present

## 2015-10-20 DIAGNOSIS — I824Z2 Acute embolism and thrombosis of unspecified deep veins of left distal lower extremity: Secondary | ICD-10-CM | POA: Diagnosis not present

## 2015-10-20 DIAGNOSIS — R945 Abnormal results of liver function studies: Secondary | ICD-10-CM | POA: Diagnosis not present

## 2015-10-20 LAB — HEPATIC FUNCTION PANEL
ALK PHOS: 199 U/L — AB (ref 38–126)
ALT: 183 U/L — AB (ref 14–54)
AST: 61 U/L — AB (ref 15–41)
Albumin: 3.2 g/dL — ABNORMAL LOW (ref 3.5–5.0)
Bilirubin, Direct: <0.1 mg/dL — ABNORMAL LOW (ref 0.1–0.5)
TOTAL PROTEIN: 5.5 g/dL — AB (ref 6.5–8.1)
Total Bilirubin: 0.3 mg/dL (ref 0.3–1.2)

## 2015-10-20 LAB — CBC
HEMATOCRIT: 26 % — AB (ref 35.0–47.0)
HEMOGLOBIN: 8.7 g/dL — AB (ref 12.0–16.0)
MCH: 29.6 pg (ref 26.0–34.0)
MCHC: 33.7 g/dL (ref 32.0–36.0)
MCV: 87.8 fL (ref 80.0–100.0)
Platelets: 152 10*3/uL (ref 150–440)
RBC: 2.96 MIL/uL — ABNORMAL LOW (ref 3.80–5.20)
RDW: 16.4 % — ABNORMAL HIGH (ref 11.5–14.5)
WBC: 8.8 10*3/uL (ref 3.6–11.0)

## 2015-10-20 LAB — URINE CULTURE

## 2015-10-20 LAB — HEPATITIS PANEL, ACUTE
HCV Ab: <0.1 s/co ratio (ref 0.0–0.9)
HEP A IGM: NEGATIVE
Hep B C IgM: NEGATIVE
Hepatitis B Surface Ag: NEGATIVE

## 2015-10-20 LAB — HEPARIN LEVEL (UNFRACTIONATED): Heparin Unfractionated: 0.33 IU/mL (ref 0.30–0.70)

## 2015-10-20 MED ORDER — APIXABAN 5 MG PO TABS: 5.0000 mg | ORAL_TABLET | Freq: Two times a day (BID) | ORAL | Status: DC

## 2015-10-20 MED ORDER — APIXABAN 5 MG PO TABS
10.0000 mg | ORAL_TABLET | Freq: Two times a day (BID) | ORAL | Status: DC
  Administered 2015-10-20: 10 mg via ORAL
  Filled 2015-10-20: qty 2

## 2015-10-20 MED ORDER — APIXABAN 5 MG PO TABS: 10.0000 mg | ORAL_TABLET | Freq: Two times a day (BID) | ORAL | Status: DC

## 2015-10-20 MED ORDER — HYDROCODONE-ACETAMINOPHEN 5-325 MG PO TABS: 1.0000 | ORAL_TABLET | ORAL | Status: DC | PRN

## 2015-10-20 MED ORDER — ENSURE ENLIVE PO LIQD: 237.0000 mL | Freq: Two times a day (BID) | ORAL | Status: DC

## 2015-10-20 NOTE — Progress Notes (Signed)
Initial Nutrition Assessment  DOCUMENTATION CODES:   Severe malnutrition in context of acute illness/injury however likely to become a chronic illness   INTERVENTION:   Cater to pt preferences on soft diet order Will send plastic utensils only as pt tolerating better on chemotherapy Will recommend Ensure Enlive po BID, each supplement provides 350 kcal and 20 grams of protein, to aid with caloric and protein intake.   NUTRITION DIAGNOSIS:   Malnutrition related to acute illness, cancer and cancer related treatments as evidenced by energy intake < or equal to 50% for > or equal to 5 days, moderate depletions of muscle mass, percent weight loss.  GOAL:   Patient will meet greater than or equal to 90% of their needs  MONITOR:   PO intake, Supplement acceptance, Labs, Weight trends, I & O's  REASON FOR ASSESSMENT:   Malnutrition Screening Tool    ASSESSMENT:   Pt admitted with left leg cramps possibly secondary to DVT. Pt with h/o Bcell lymphoma currently undergoing chemotherapy, last received chemotherapy on 10/06/2015.   Past Medical History  Diagnosis Date  . Tachycardia   . Overweight(278.02)     Obesity  . Chest pain, unspecified   . Palpitations   . Osteoporosis   . Thrombocytopenia ([place])   . Lymphoma ([place]) 09/14/15    Monoclonal B cell lymphoma  . Diverticulosis 07/06/15  . Esophagitis 07/06/15  . Gastritis 07/06/15  . 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 GERD (gastroesophageal reflux disease)   . Fatigue   . Leg cramps   . Night sweats   . Cancer ([place])   . Dysrhythmia   . Shortness of breath dyspnea   . Arthritis   . Blood dyscrasia      Diet Order:  DIET SOFT Room service appropriate?: Yes; Fluid consistency:: Thin   Pt reports eating a little bit of french toast and yogurt this morning for breakfast.  Pt reports poor po intake since chemotherapy 2 weeks ago. Pt reports eating soups, puddings and sometimes eggs but eats small amounts at best. Pt reports  drinking Premier Protein shakes maybe one a day. Pt reports tolerating plastic utensils best. Pt also reports right now tolerating warm or hot foods only as she reports having mouth ulcers since chemotherapy (which she reports is much improved). Pt reports not tolerating ice cream or milkshakes at all currently.  Medications: diflucan, nystatin Labs: Mg 1.6   Gastrointestinal Profile: Last BM:  no BM documented   Nutrition-Focused Physical Exam Findings: Nutrition-Focused physical exam completed. Findings are no fat depletion, mild-moderate muscle depletion, and no edema.     Weight Change: Pt reports weight of 158lbs last [facility] at [facility] and reports 40lbs weight loss in 2 months (20% weight loss in 2 months)   Skin:  Reviewed, no issues   Height:   Ht Readings from Last 1 Encounters:  10/19/15 5\' 6"  (1.676 m)    Weight:   Wt Readings from Last 1 Encounters:  10/19/15 160 lb 8 oz (72.802 kg)     BMI:  Body mass index is 25.92 kg/(m^2).  Estimated Nutritional Needs:   Kcal:  2190-2500kcals  Protein:  88-110g protein  Fluid:  >/= 2L fluid  EDUCATION NEEDS:   No education needs identified at this time   Dwyane Luo, RD, LDN Pager [phone] Weekend/On-Call Pager [phone]

## 2015-10-20 NOTE — Discharge Instructions (Signed)
No massaging of left leg

## 2015-10-20 NOTE — Plan of Care (Signed)
Problem: Education: Goal: Knowledge of [facility] Education information/materials will improve Outcome: Progressing Pt likes to be called Ulis Rias    Past Medical History   Diagnosis  Date   .  Tachycardia     .  Overweight(278.02)         Obesity   .  Chest pain, unspecified     .  Palpitations     .  Osteoporosis     .  Thrombocytopenia ([place])     .  Lymphoma ([place])  09/14/15       Monoclonal B cell lymphoma   .  Diverticulosis  07/06/15   .  Esophagitis  07/06/15   .  Gastritis  07/06/15   .  Hypopharyngeal lesion  07/06/15   .  GERD (gastroesophageal reflux disease)     .  Fatigue     .  Leg cramps     .  Night sweats     .  Cancer ([place])     .  Dysrhythmia     .  Shortness of breath dyspnea     .  Arthritis     .  Blood dyscrasia              Pt is well controlled with home medications

## 2015-10-20 NOTE — Progress Notes (Signed)
Patient discharged home per MD order. All discharge instructions given and all questions answered. 

## 2015-10-20 NOTE — Care Management (Signed)
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 Lives with husband Broadus John [phone]). Dr. Ronette Deter is listed as primary care physician. Goes to the [facility] for Chemotherapy. Last visit to the [facility] was 6./2/17. Independent of all basic and instrumental activities of daily living. Husband will transport. Oncology consult in progress.  Spoke with Dr. Fritzi Mandes. Possible discharge to home later today. Shelbie Ammons RN MSN CCM Care management [phone]

## 2015-10-20 NOTE — Telephone Encounter (Signed)
She will be following up with her oncologist. We can just make follow up next 1min opening.

## 2015-10-20 NOTE — Telephone Encounter (Signed)
Husband is aware.

## 2015-10-20 NOTE — Telephone Encounter (Signed)
Asking for refill on Fioricet stating she alternates that and the Percocet for her headaches. Should she be on both meds?

## 2015-10-20 NOTE — Discharge Summary (Signed)
[facility] NAME: Stacy Murillo    MR#:  [id number]  DATE OF BIRTH:  1955-09-17  DATE OF ADMISSION:  10/19/2015 ADMITTING PHYSICIAN: Fritzi Mandes, MD  DATE OF DISCHARGE: 10/20/15  PRIMARY CARE PHYSICIAN: Rica Mast, MD    ADMISSION DIAGNOSIS:  Elevated LFTs [R79.89] DVT (deep venous thrombosis), left [I82.402]  DISCHARGE DIAGNOSIS:  Febrile ilness no infectious source found Elevated transaminases likely due to valtrex  (d/ced) Left peroneal vein DVT (small) on eliquis Diffuse B cell lymphoma  SECONDARY DIAGNOSIS:   Past Medical History  Diagnosis Date  . Tachycardia   . Overweight(278.02)     Obesity  . Chest pain, unspecified   . Palpitations   . Osteoporosis   . Thrombocytopenia ([place])   . Lymphoma ([place]) 09/14/15    Monoclonal B cell lymphoma  . Diverticulosis 07/06/15  . Esophagitis 07/06/15  . Gastritis 07/06/15  . 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 GERD (gastroesophageal reflux disease)   . Fatigue   . Leg cramps   . Night sweats   . Cancer ([place])   . Dysrhythmia   . Shortness of breath dyspnea   . Arthritis   . Blood dyscrasia     HOSPITAL COURSE:   Stacy Murillo is a 60 y.o. female with a known history of Monoclonal B cell lymphoma undergoing chemotherapy, thrombocytopenia, arthritis. Emergency room with complaints of left leg cramping pain. Workup showed patient possibly has DVT in the branch of peroneal wane in the calf. In the emergency room she started having chills temperature was checked she had 102.2.   1. 1. Left leg pain with cramping consistent with possible acute DVT in the peronealvein -Given her history of cancer was started on IV heparin drip---changed to po eliquis Monitor H&H. -Patient'shemoglobin is 8.9. No history of GI bleed -some leg cramping. Po pain meds   2. Elevated LFTs unclear etiology. The most recent medications patient was started was valtrex and fluconazole  -We  will hold those meds -Acute hepatitis panel negative -Given fever in the emergency room it could be viral  --Ultrasound abdomen did not show anything acute  -lFT's improved   3. Febrile illness etiology unclear -no fever -White count stable no indication for antibiotic at present  4. B-cell lymphoma monoclonal undergoing chemotherapy -Oncology consultation noted. Ok to be d/ced and f//u oncology as outpt.  D/c home CONSULTS OBTAINED:     DRUG ALLERGIES:  No Known Allergies  DISCHARGE MEDICATIONS:   Current Discharge Medication List    START taking these medications   Details  !! apixaban (ELIQUIS) 5 MG TABS tablet Take 2 tablets (10 mg total) by mouth 2 (two) times daily. Till 10/26/15 Qty: 60 tablet, Refills: 1    !! apixaban (ELIQUIS) 5 MG TABS tablet Take 1 tablet (5 mg total) by mouth 2 (two) times daily. From 10/27/15 Qty: 60 tablet, Refills: 0    HYDROcodone-acetaminophen (NORCO/VICODIN) 5-325 MG tablet Take 1-2 tablets by mouth every 4 (four) hours as needed for moderate pain. Qty: 30 tablet, Refills: 0     !! - Potential duplicate medications found. Please discuss with provider.    CONTINUE these medications which have NOT CHANGED   Details  butalbital-acetaminophen-caffeine (FIORICET) 50-325-40 MG tablet Take 1-2 tablets by mouth every 6 (six) hours as needed for headache. Qty: 20 tablet, Refills: 0    diazepam (VALIUM) 5 MG tablet Take 5 mg by mouth every 8 (eight) hours as needed (for vertigo).  lidocaine-prilocaine (EMLA) cream Apply 1 application topically as needed (prior to accessing port).    ondansetron (ZOFRAN) 8 MG tablet Take 8 mg by mouth every 8 (eight) hours as needed for nausea or vomiting.    oxyCODONE-acetaminophen (PERCOCET) 7.5-325 MG tablet Take 1 tablet by mouth every 6 (six) hours as needed for severe pain. Qty: 60 tablet, Refills: 0    predniSONE (DELTASONE) 50 MG tablet Take 100 mg by mouth daily with breakfast. Pt takes for five  days starting the day of chemo.    prochlorperazine (COMPAZINE) 10 MG tablet Take 1 tablet (10 mg total) by mouth every 6 (six) hours as needed for nausea or vomiting. 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       STOP taking these medications     valACYclovir (VALTREX) 500 MG tablet      fluconazole (DIFLUCAN) 100 MG tablet      lidocaine (XYLOCAINE) 2 % solution      nystatin (MYCOSTATIN) 100000 UNIT/ML suspension         If you experience worsening of your admission symptoms, develop shortness of breath, life threatening emergency, suicidal or homicidal thoughts you must seek medical attention immediately by calling 911 or calling your MD immediately  if symptoms less severe.  You Must read complete instructions/literature along with all the possible adverse reactions/side effects for all the Medicines you take and that have been prescribed to you. Take any new Medicines after you have completely understood and accept all the possible adverse reactions/side effects.   Please note  You were cared for by a hospitalist during your hospital stay. If you have any questions about your discharge medications or the care you received while you were in the hospital after you are discharged, you can call the unit and asked to speak with the hospitalist on call if the hospitalist that took care of you is not available. Once you are discharged, your primary care physician will handle any further medical issues. Please note that NO REFILLS for any discharge medications will be authorized once you are discharged, as it is imperative that you return to your primary care physician (or establish a relationship with a primary care physician if you do not have one) for your aftercare needs so that they can reassess your  need for medications and monitor your lab values. Today   SUBJECTIVE  Left leg cramping. No fever   VITAL SIGNS:  Blood pressure 122/60, pulse 86, temperature 99 F (37.2 C), temperature source Oral, resp. rate 17, height 5\' 6"  (1.676 m), weight 72.802 kg (160 lb 8 oz), SpO2 98 %.  I/O:    Intake/Output Summary (Last 24 hours) at 10/20/15 1106 Last data filed at 10/20/15 0906  Gross per 24 hour  Intake      0 ml  Output    900 ml  Net   -900 ml    PHYSICAL EXAMINATION:  GENERAL:  60 y.o.-year-old patient lying in the bed with no acute distress.  EYES: Pupils equal, round, reactive to light and accommodation. No scleral icterus. Extraocular muscles intact.  HEENT: Head atraumatic, normocephalic. Oropharynx and nasopharynx clear.  NECK:  Supple, no jugular venous distention. No thyroid enlargement, no tenderness.  LUNGS: Normal breath sounds bilaterally, no wheezing, rales,rhonchi or crepitation. No use of accessory muscles of  respiration.  CARDIOVASCULAR: S1, S2 normal. No murmurs, rubs, or gallops.  ABDOMEN: Soft, non-tender, non-distended. Bowel sounds present. No organomegaly or mass.  EXTREMITIES: No pedal edema, cyanosis, or clubbing.  NEUROLOGIC: Cranial nerves II through XII are intact. Muscle strength 5/5 in all extremities. Sensation intact. Gait not checked.  PSYCHIATRIC: The patient is alert and oriented x 3.  SKIN: No obvious rash, lesion, or ulcer.   DATA REVIEW:   CBC   Recent Labs Lab 10/20/15 0237  WBC 8.8  HGB 8.7*  HCT 26.0*  PLT 152    Chemistries   Recent Labs Lab 10/19/15 0653 10/19/15 1005 10/20/15 0237  NA 136  --   --   K 3.5  --   --   CL 103  --   --   CO2 25  --   --   GLUCOSE 121*  --   --   BUN 7  --   --   CREATININE 0.76  --   --   CALCIUM 8.2*  --   --   MG  --  1.6*  --   AST 353*  --  61*  ALT 302*  --  183*  ALKPHOS 244*  --  199*  BILITOT 0.3  --  0.3    Microbiology Results   Recent Results (from the past 240  hour(s))  Blood culture (routine x 2)     Status: None (Preliminary result)   Collection Time: 10/19/15 11:32 AM  Result Value Ref Range Status   Specimen Description BLOOD RIGHT AC  Final   Special Requests BOTTLES DRAWN AEROBIC AND ANAEROBIC 14ML  Final   Culture NO GROWTH < 24 HOURS  Final   Report Status PENDING  Incomplete  Blood culture (routine x 2)     Status: None (Preliminary result)   Collection Time: 10/19/15 11:32 AM  Result Value Ref Range Status   Specimen Description BLOOD LEFT AC  Final   Special Requests   Final    BOTTLES DRAWN AEROBIC AND ANAEROBIC AER 15ML ANA 13ML   Culture NO GROWTH < 24 HOURS  Final   Report Status PENDING  Incomplete    RADIOLOGY:  Dg Chest 2 View  10/19/2015  CLINICAL DATA:  Fever today. History of current non-Hodgkin's lymphoma. EXAM: CHEST  2 VIEW COMPARISON:  08/14/2015 FINDINGS: Right IJ Port-A-Cath has tip over the SVC. Lungs are adequately inflated without consolidation or effusion. Cardiomediastinal silhouette and remainder of the exam is unchanged. IMPRESSION: No active cardiopulmonary disease. Electronically Signed   By: Marin Olp M.D.   On: 10/19/2015 13:16   US Venous Img Lower Bilateral  10/19/2015  CLINICAL DATA:  Bilateral lower extremity swelling and pain for 2 days, left side greater than right. Lymphoma. Varicose veins. EXAM: BILATERAL LOWER EXTREMITY VENOUS DOPPLER ULTRASOUND TECHNIQUE: Gray-scale sonography with graded compression, as well as color Doppler and duplex ultrasound were performed to evaluate the lower extremity deep venous systems from the level of the common femoral vein and including the common femoral, femoral, profunda femoral, popliteal and calf veins including the posterior tibial, peroneal and gastrocnemius veins when visible. The superficial great saphenous vein was also interrogated. Spectral Doppler was utilized to evaluate flow at rest and with distal augmentation maneuvers in the common femoral, femoral  and popliteal veins. COMPARISON:  None. FINDINGS: RIGHT LOWER EXTREMITY Common Femoral Vein: No evidence of thrombus. Normal compressibility, respiratory phasicity and response to augmentation. Saphenofemoral Junction: No evidence of thrombus. Normal compressibility and flow  on color Doppler imaging. Profunda Femoral Vein: No evidence of thrombus. Normal compressibility and flow on color Doppler imaging. Femoral Vein: No evidence of thrombus. Normal compressibility, respiratory phasicity and response to augmentation. Popliteal Vein: No evidence of thrombus. Normal compressibility, respiratory phasicity and response to augmentation. Calf Veins: No evidence of thrombus. Normal compressibility and flow on color Doppler imaging. Superficial Great Saphenous Vein: No evidence of thrombus. Normal compressibility and flow on color Doppler imaging. Venous Reflux:  None. Other Findings: A rounded lymph node with lack of fatty hilum is seen in the right inguinal region measuring 1.3 by 0.8 x 0.9 cm. Two small fluid collections are also seen in the right groin measuring approximately 1-2 cm at site of recent lymph node resection, consistent with small postop seromas or lymphoceles. LEFT LOWER EXTREMITY Common Femoral Vein: No evidence of thrombus. Normal compressibility, respiratory phasicity and response to augmentation. Saphenofemoral Junction: No evidence of thrombus. Normal compressibility and flow on color Doppler imaging. Profunda Femoral Vein: No evidence of thrombus. Normal compressibility and flow on color Doppler imaging. Femoral Vein: No evidence of thrombus. Normal compressibility, respiratory phasicity and response to augmentation. Popliteal Vein: No evidence of thrombus. Normal compressibility, respiratory phasicity and response to augmentation. Calf Veins: Distended noncompressible vein is seen in the left mid calf, which is likely a branch of the peroneal vein, consistent with DVT. Other visualized calf veins  are compressible and unremarkable in appearance. Superficial Great Saphenous Vein: No evidence of thrombus. Normal compressibility and flow on color Doppler imaging. Venous Reflux:  None. Other Findings:  None. IMPRESSION: Probable DVT in single left mid calf vein, which is likely a branch of the peroneal vein. No other sites of DVT identified in either lower extremity. Mildly enlarged right inguinal lymph node, consistent with history of lymphoma. Two small fluid collections in right groin at site recent lymph node resection, consistent with postop seromas or lymphoceles. Electronically Signed   By: Earle Gell M.D.   On: 10/19/2015 09:08   US Abdomen Limited Ruq  10/19/2015  CLINICAL DATA:  Elevated liver function studies, history of lymphoma ; began chemotherapy 2 weeks ago; remote history of cholecystectomy. EXAM: US ABDOMEN LIMITED - RIGHT UPPER QUADRANT COMPARISON:  PET-CT study of Sep 26, 2015 short FINDINGS: Gallbladder: No gallstones or wall thickening visualized. No sonographic Murphy sign noted by sonographer. Common bile duct: Diameter: 9.9 mm Liver: The hepatic echotexture is normal. There is no focal mass nor ductal dilation. IMPRESSION: Common bile duct dilation not out of proportion to the post cholecystectomy state. No intraluminal stones are observed. No intrahepatic ductal dilation. No hepatic mass or other acute hepatic abnormality. Electronically Signed   By: David  Martinique M.D.   On: 10/19/2015 08:54     Management plans discussed with the patient, family and they are in agreement.  CODE STATUS:     Code Status Orders        Start     Ordered   10/19/15 2134  Full code   Continuous     10/19/15 2133    Code Status History    Date Active Date Inactive Code Status Order ID Comments User Context   This patient has a current code status but no historical code status.    Advance Directive Documentation        Most Recent Value   Type of Advance Directive  Healthcare Power of  Attorney, Living will   Pre-existing out of facility DNR order (yellow form or pink MOST form)     "  MOST" Form in Place?        TOTAL TIME TAKING CARE OF THIS PATIENT: 40 minutes.    Destiny Trickey M.D on 10/20/2015 at 11:06 AM  Between 7am to 6pm - Pager - [phone] After 6pm go to [email or web address] - password [email or web address] [facility]  Office  [phone]  CC: Primary care physician; Rica Mast, MD

## 2015-10-20 NOTE — Progress Notes (Signed)
[place]   DOB:[birth year]   [id number]    Subjective: Overnight no acute events. Patient's left lower extremity mild improvement in pain noted. She continues to have mild cramp in the right lower extremity. No fever no chills. No nausea no vomiting.   ROS: No chest pain shortness of breath or cough.  Objective:  Filed Vitals:   10/19/15 2118 10/20/15 0443  BP: 134/64 122/60  Pulse: 94 86  Temp: 98.4 F (36.9 C) 99 F (37.2 C)  Resp: 18 17     Intake/Output Summary (Last 24 hours) at 10/20/15 0804 Last data filed at 10/20/15 0206  Gross per 24 hour  Intake      0 ml  Output    400 ml  Net   -400 ml    GENERAL: Well-nourished well-developed; Alert, no distress and mild discomfortable. Patient is alone. Appears fatigued.  EYES: no pallor or icterus OROPHARYNX: no thrush; good dentition; ~0.5cm ulcer roof of mouth NECK: supple, no masses felt LYMPH: no palpable lymphadenopathy in the cervical, axillary; 1 to 2 cm lymph node felt in the right inguinal region. LUNGS: clear to auscultation and No wheeze or crackles HEART/CVS: regular rate & rhythm and no murmurs; positive for pain in the left calf/slight improvement noted ABDOMEN: abdomen soft, non-tender and normal bowel sounds; positive for spleen.  Musculoskeletal:no cyanosis of digits and no clubbing  PSYCH: alert & oriented x 3 with fluent speech NEURO: no focal motor/sensory deficits;  SKIN: Blisters noted in the upper lip.   Labs:  Lab Results  Component Value Date   WBC 8.8 10/20/2015   HGB 8.7* 10/20/2015   HCT 26.0* 10/20/2015   MCV 87.8 10/20/2015   PLT 152 10/20/2015   NEUTROABS 6.8* 10/19/2015    Lab Results  Component Value Date   NA 136 10/19/2015   K 3.5 10/19/2015   CL 103 10/19/2015   CO2 25 10/19/2015    Studies:  Dg Chest 2 View  10/19/2015  CLINICAL DATA:  Fever today. History of current non-Hodgkin's lymphoma. EXAM: CHEST  2 VIEW COMPARISON:  08/14/2015 FINDINGS: Right IJ  Port-A-Cath has tip over the SVC. Lungs are adequately inflated without consolidation or effusion. Cardiomediastinal silhouette and remainder of the exam is unchanged. IMPRESSION: No active cardiopulmonary disease. Electronically Signed   By: Marin Olp M.D.   On: 10/19/2015 13:16   US Venous Img Lower Bilateral  10/19/2015  CLINICAL DATA:  Bilateral lower extremity swelling and pain for 2 days, left side greater than right. Lymphoma. Varicose veins. EXAM: BILATERAL LOWER EXTREMITY VENOUS DOPPLER ULTRASOUND TECHNIQUE: Gray-scale sonography with graded compression, as well as color Doppler and duplex ultrasound were performed to evaluate the lower extremity deep venous systems from the level of the common femoral vein and including the common femoral, femoral, profunda femoral, popliteal and calf veins including the posterior tibial, peroneal and gastrocnemius veins when visible. The superficial great saphenous vein was also interrogated. Spectral Doppler was utilized to evaluate flow at rest and with distal augmentation maneuvers in the common femoral, femoral and popliteal veins. COMPARISON:  None. FINDINGS: RIGHT LOWER EXTREMITY Common Femoral Vein: No evidence of thrombus. Normal compressibility, respiratory phasicity and response to augmentation. Saphenofemoral Junction: No evidence of thrombus. Normal compressibility and flow on color Doppler imaging. Profunda Femoral Vein: No evidence of thrombus. Normal compressibility and flow on color Doppler imaging. Femoral Vein: No evidence of thrombus. Normal compressibility, respiratory phasicity and response to augmentation. Popliteal Vein: No evidence of thrombus. Normal compressibility, respiratory  phasicity and response to augmentation. Calf Veins: No evidence of thrombus. Normal compressibility and flow on color Doppler imaging. Superficial Great Saphenous Vein: No evidence of thrombus. Normal compressibility and flow on color Doppler imaging. Venous Reflux:   None. Other Findings: A rounded lymph node with lack of fatty hilum is seen in the right inguinal region measuring 1.3 by 0.8 x 0.9 cm. Two small fluid collections are also seen in the right groin measuring approximately 1-2 cm at site of recent lymph node resection, consistent with small postop seromas or lymphoceles. LEFT LOWER EXTREMITY Common Femoral Vein: No evidence of thrombus. Normal compressibility, respiratory phasicity and response to augmentation. Saphenofemoral Junction: No evidence of thrombus. Normal compressibility and flow on color Doppler imaging. Profunda Femoral Vein: No evidence of thrombus. Normal compressibility and flow on color Doppler imaging. Femoral Vein: No evidence of thrombus. Normal compressibility, respiratory phasicity and response to augmentation. Popliteal Vein: No evidence of thrombus. Normal compressibility, respiratory phasicity and response to augmentation. Calf Veins: Distended noncompressible vein is seen in the left mid calf, which is likely a branch of the peroneal vein, consistent with DVT. Other visualized calf veins are compressible and unremarkable in appearance. Superficial Great Saphenous Vein: No evidence of thrombus. Normal compressibility and flow on color Doppler imaging. Venous Reflux:  None. Other Findings:  None. IMPRESSION: Probable DVT in single left mid calf vein, which is likely a branch of the peroneal vein. No other sites of DVT identified in either lower extremity. Mildly enlarged right inguinal lymph node, consistent with history of lymphoma. Two small fluid collections in right groin at site recent lymph node resection, consistent with postop seromas or lymphoceles. Electronically Signed   By: Earle Gell M.D.   On: 10/19/2015 09:08   US Abdomen Limited Ruq  10/19/2015  CLINICAL DATA:  Elevated liver function studies, history of lymphoma ; began chemotherapy 2 weeks ago; remote history of cholecystectomy. EXAM: US ABDOMEN LIMITED - RIGHT UPPER  QUADRANT COMPARISON:  PET-CT study of Sep 26, 2015 short FINDINGS: Gallbladder: No gallstones or wall thickening visualized. No sonographic Murphy sign noted by sonographer. Common bile duct: Diameter: 9.9 mm Liver: The hepatic echotexture is normal. There is no focal mass nor ductal dilation. IMPRESSION: Common bile duct dilation not out of proportion to the post cholecystectomy state. No intraluminal stones are observed. No intrahepatic ductal dilation. No hepatic mass or other acute hepatic abnormality. Electronically Signed   By: David  Martinique M.D.   On: 10/19/2015 08:54    Assessment & Plan:   # 60 year old female patient with diffuse large B-cell lymphoma status currently on R CHOP chemotherapy-currently admitted to the hospital for cramping in the left lower extremity; also noted to have elevated LFTs  # Left lower extremity cramping/pain- ultrasound shows DVT in the peroneal vein/left mid calf which is in fact is not very extensive on ultrasound. Slight improvement noted on IV heparin. Patient has been transitioned to Eliquis this morning. Regarding pain recommend oxycodone for pain control.  # Elevated LFTs AST/ALT- 300s; repeat LFTs improving. Likely secondary to valacyclovir/Diflucan.  # Diffuse large B-cell lymphoma- status post 1 cycle improvement of platelet count/LDH noted.   The above plan of care was discussed with the patient in detail. Also discussed with Dr. Posey Pronto. Patient will follow-up with me on June 15 for cycle #2 of R CHOP chemotherapy.   Cammie Sickle, MD 10/20/2015  8:04 AM

## 2015-10-20 NOTE — Telephone Encounter (Signed)
Patient will discharge from [facility] on 06/08. She was admitted for frburial illness   Please advise a place on Dr Gilford Rile schedule to place pt.  [phone]

## 2015-10-20 NOTE — Progress Notes (Addendum)
ANTICOAGULATION CONSULT NOTE - Initial Consult  Pharmacy Consult for Heparin Indication: DVT  No Known Allergies  Patient Measurements: Height: 5\' 6"  (167.6 cm) Weight: 160 lb 8 oz (72.802 kg) IBW/kg (Calculated) : 59.3 Heparin Dosing Weight: 65 kg  Vital Signs: Temp: 98.4 F (36.9 C) (06/07 2118) Temp Source: Oral (06/07 2118) BP: 134/64 mmHg (06/07 2118) Pulse Rate: 94 (06/07 2118)  Labs:  Recent Labs  10/19/15 0653 10/19/15 1338 10/19/15 2020 10/20/15 0237  HGB 8.9*  --   --  8.7*  HCT 26.8*  --   --  26.0*  PLT 153  --   --  152  APTT  --  >160*  --   --   LABPROT  --  15.9*  --   --   INR  --  1.26  --   --   HEPARINUNFRC  --   --  0.33 0.33  CREATININE 0.76  --   --   --   CKTOTAL 11*  --   --   --     Estimated Creatinine Clearance: 75.4 mL/min (by C-G formula based on Cr of 0.76).   Medical History: Past Medical History  Diagnosis Date  . Tachycardia   . Overweight(278.02)     Obesity  . Chest pain, unspecified   . Palpitations   . Osteoporosis   . Thrombocytopenia ([place])   . Lymphoma ([place]) 09/14/15    Monoclonal B cell lymphoma  . Diverticulosis 07/06/15  . Esophagitis 07/06/15  . Gastritis 07/06/15  . 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 GERD (gastroesophageal reflux disease)   . Fatigue   . Leg cramps   . Night sweats   . Cancer ([place])   . Dysrhythmia   . Shortness of breath dyspnea   . Arthritis   . Blood dyscrasia     Medications:  Prescriptions prior to admission  Medication Sig Dispense Refill Last Dose  . butalbital-acetaminophen-caffeine (FIORICET) 50-325-40 MG tablet Take 1-2 tablets by mouth every 6 (six) hours as needed for headache. 20 tablet 0 10/18/2015 at [facility]   . diazepam (VALIUM) 5 MG tablet Take 5 mg by mouth every 8 (eight) hours as needed (for vertigo).   10/19/2015 at Unknown time  . lidocaine-prilocaine (EMLA) cream Apply 1 application topically as needed (prior to accessing port).   10/19/2015 at Unknown time  . ondansetron  (ZOFRAN) 8 MG tablet Take 8 mg by mouth every 8 (eight) hours as needed for nausea or vomiting.   10/18/2015 at Unknown time  . oxyCODONE-acetaminophen (PERCOCET) 7.5-325 MG tablet Take 1 tablet by mouth every 6 (six) hours as needed for severe pain. 60 tablet 0 Past Week at Unknown time  . predniSONE (DELTASONE) 50 MG tablet Take 100 mg by mouth daily with breakfast. Pt takes for five days starting the day of chemo.   Past Month at Unknown time  . prochlorperazine (COMPAZINE) 10 MG tablet Take 1 tablet (10 mg total) by mouth every 6 (six) hours as needed for nausea or vomiting. 40 tablet 1 10/19/2015 at Unknown time  . ranitidine (ZANTAC) 150 MG tablet Take 150 mg by mouth 2 (two) times daily.    10/19/2015 at Unknown time  . valACYclovir (VALTREX) 500 MG tablet Take 1 tablet (500 mg total) by mouth 3 (three) times daily. 90 tablet 3 10/19/2015 at Unknown time   Scheduled:  . famotidine  10 mg Oral Daily  . fluconazole  100 mg Oral Daily  . nystatin  5 mL Oral QID  Infusions:  . heparin 1,100 Units/hr (10/19/15 1329)    Assessment: 60 y/o F with a h/o lymphoma on chemotherapy admitted with LE DVT. Patient not on anticoagulants PTA per history and per patient.   Goal of Therapy:  Heparin level 0.3-0.7 units/ml Monitor platelets by anticoagulation protocol: Yes   Plan:  Heparin level therapeutic x 2. Continue current rate. Pharmacy will continue to monitor heparin level and CBC daily.  Laural Benes, Pharm.D., BCPS Clinical Pharmacist 10/20/2015,4:03 AM  10/20/15 [id number] Dr. Posey Pronto requests switch to Eliquis for VTE. Scheduled heparin to stop, discontinued pending lab and heparin consult, ordered Eliquis 10 mg PO BID x 7 days followed by 5 mg po BID. Denzel Etienne A. Huslia, [place].D., BCPS, Clinical Pharmacist

## 2015-10-21 ENCOUNTER — Ambulatory Visit: Payer: BLUE CROSS/BLUE SHIELD | Admitting: Internal Medicine

## 2015-10-21 ENCOUNTER — Encounter: Payer: Self-pay | Admitting: General Surgery

## 2015-10-21 NOTE — Telephone Encounter (Signed)
Called patient and instructed per Dr B that he does not want her on Fioricet and Percocet to just use Percocet. Acknowledged this to me and instructed to keep her feet elevated when she is sitting. She asked if recliner position is enough and I told her yes

## 2015-10-21 NOTE — Telephone Encounter (Signed)
Patient called this morning and states that her right ankle and leg are now swollen. Her clot is in the left leg which she states is not swollen. She reports she has no ankle, it is that swollen. Asking for advice and whether she need sto be concerned about it

## 2015-10-24 ENCOUNTER — Telehealth: Payer: Self-pay | Admitting: *Deleted

## 2015-10-24 ENCOUNTER — Inpatient Hospital Stay (HOSPITAL_BASED_OUTPATIENT_CLINIC_OR_DEPARTMENT_OTHER): Payer: BLUE CROSS/BLUE SHIELD | Admitting: Oncology

## 2015-10-24 ENCOUNTER — Inpatient Hospital Stay: Payer: BLUE CROSS/BLUE SHIELD

## 2015-10-24 VITALS — BP 114/69 | HR 94 | Temp 97.0°F | Ht 62.0 in | Wt 158.6 lb

## 2015-10-24 DIAGNOSIS — D696 Thrombocytopenia, unspecified: Secondary | ICD-10-CM

## 2015-10-24 DIAGNOSIS — C8521 Mediastinal (thymic) large B-cell lymphoma, lymph nodes of head, face, and neck: Secondary | ICD-10-CM | POA: Diagnosis not present

## 2015-10-24 DIAGNOSIS — Z5111 Encounter for antineoplastic chemotherapy: Secondary | ICD-10-CM | POA: Diagnosis not present

## 2015-10-24 DIAGNOSIS — C8583 Other specified types of non-Hodgkin lymphoma, intra-abdominal lymph nodes: Secondary | ICD-10-CM

## 2015-10-24 DIAGNOSIS — Z79899 Other long term (current) drug therapy: Secondary | ICD-10-CM | POA: Diagnosis not present

## 2015-10-24 DIAGNOSIS — K1230 Oral mucositis (ulcerative), unspecified: Secondary | ICD-10-CM | POA: Diagnosis not present

## 2015-10-24 DIAGNOSIS — Z7901 Long term (current) use of anticoagulants: Secondary | ICD-10-CM

## 2015-10-24 DIAGNOSIS — Z86718 Personal history of other venous thrombosis and embolism: Secondary | ICD-10-CM | POA: Diagnosis not present

## 2015-10-24 DIAGNOSIS — R7989 Other specified abnormal findings of blood chemistry: Secondary | ICD-10-CM | POA: Diagnosis not present

## 2015-10-24 DIAGNOSIS — I82402 Acute embolism and thrombosis of unspecified deep veins of left lower extremity: Secondary | ICD-10-CM | POA: Diagnosis not present

## 2015-10-24 DIAGNOSIS — K219 Gastro-esophageal reflux disease without esophagitis: Secondary | ICD-10-CM | POA: Diagnosis not present

## 2015-10-24 DIAGNOSIS — R1310 Dysphagia, unspecified: Secondary | ICD-10-CM | POA: Diagnosis not present

## 2015-10-24 DIAGNOSIS — E669 Obesity, unspecified: Secondary | ICD-10-CM | POA: Diagnosis not present

## 2015-10-24 LAB — COMPREHENSIVE METABOLIC PANEL
ALK PHOS: 451 U/L — AB (ref 38–126)
ALT: 112 U/L — AB (ref 14–54)
AST: 30 U/L (ref 15–41)
Albumin: 3.5 g/dL (ref 3.5–5.0)
Anion gap: 7 (ref 5–15)
BILIRUBIN TOTAL: 0.3 mg/dL (ref 0.3–1.2)
BUN: 14 mg/dL (ref 6–20)
CALCIUM: 9.1 mg/dL (ref 8.9–10.3)
CO2: 28 mmol/L (ref 22–32)
Chloride: 102 mmol/L (ref 101–111)
Creatinine, Ser: 0.69 mg/dL (ref 0.44–1.00)
GFR calc Af Amer: >60 mL/min (ref >60)
GFR calc non Af Amer: >60 mL/min (ref >60)
GLUCOSE: 114 mg/dL — AB (ref 65–99)
POTASSIUM: 3.8 mmol/L (ref 3.5–5.1)
Sodium: 137 mmol/L (ref 135–145)
TOTAL PROTEIN: 7.2 g/dL (ref 6.5–8.1)

## 2015-10-24 LAB — CBC WITH DIFFERENTIAL/PLATELET
BASOS ABS: 0 10*3/uL (ref 0–0.1)
BASOS PCT: 0 %
Eosinophils Absolute: 0 10*3/uL (ref 0–0.7)
Eosinophils Relative: 0 %
HEMATOCRIT: 29.4 % — AB (ref 35.0–47.0)
HEMOGLOBIN: 10.1 g/dL — AB (ref 12.0–16.0)
Lymphocytes Relative: 7 %
Lymphs Abs: 0.7 10*3/uL — ABNORMAL LOW (ref 1.0–3.6)
MCH: 30.1 pg (ref 26.0–34.0)
MCHC: 34.2 g/dL (ref 32.0–36.0)
MCV: 88 fL (ref 80.0–100.0)
Monocytes Absolute: 0.8 10*3/uL (ref 0.2–0.9)
Monocytes Relative: 9 %
NEUTROS ABS: 8 10*3/uL — AB (ref 1.4–6.5)
NEUTROS PCT: 84 %
Platelets: 435 10*3/uL (ref 150–440)
RBC: 3.34 MIL/uL — AB (ref 3.80–5.20)
RDW: 16.7 % — ABNORMAL HIGH (ref 11.5–14.5)
WBC: 9.6 10*3/uL (ref 3.6–11.0)

## 2015-10-24 LAB — LACTATE DEHYDROGENASE: LDH: 160 U/L (ref 98–192)

## 2015-10-24 LAB — CULTURE, BLOOD (ROUTINE X 2)
CULTURE: NO GROWTH
CULTURE: NO GROWTH

## 2015-10-24 MED ORDER — ACYCLOVIR 400 MG PO TABS: 400.0000 mg | ORAL_TABLET | Freq: Two times a day (BID) | ORAL | Status: DC

## 2015-10-24 NOTE — Telephone Encounter (Signed)
Per Dr B, come in to see NP today, agrees to 145 PM appt today

## 2015-10-24 NOTE — Progress Notes (Signed)
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 CT- 1-2 CM LN subpectoral/medistinal/ retro-peritoneal/pelvic/ Right inguinal LN 1.6cm. PET- MULTIPLE LN/ Bone involvement; May 25th- R-CHOP q3 W  # Lumbar puncture- difficult-spinal headache.   # May 2017- EF- 55-65%    HISTORY OF PRESENTING ILLNESS:  Stacy Murillo 60 y.o.  female history of large B-cell lymphoma/involving the peripheral blood- is here as an acute add on for bilateral leg pain and constipation. She is also concerned with taking treatment on Thursday without antiviral because the blisters on her mouth get so bad. She has bilateral leg pain that is better today. She is able to walk on her own today. She does not take oxycodone or hydrocodone due to constipation. She would like to know what to take that will not cause constipation. She still has headaches that are worse on wakening in the morning and wear off throughout the day. She is noted to have an ulcer on the roof of her mouth but it has been there since the first outbreak and is not painful. She denies fever or chills. She has no abdominal pain. She has a good appetite. She otherwise feels well.   ROS: A complete 10 point review of system is done which is negative except mentioned above in history of present illness.Marland Kitchen  MEDICAL HISTORY:  Past Medical History  Diagnosis Date  . Tachycardia   .  Overweight(278.02)     Obesity  . Chest pain, unspecified   . Palpitations   . Osteoporosis   . Thrombocytopenia ([place])   . Lymphoma ([place]) 09/14/15    Monoclonal B cell lymphoma  . Diverticulosis 07/06/15  . Esophagitis 07/06/15  . Gastritis 07/06/15  . 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 GERD (gastroesophageal reflux disease)   . Fatigue   . Leg cramps   . Night sweats   . Cancer ([place])   . Dysrhythmia   . Shortness of breath dyspnea   . Arthritis   . Blood dyscrasia     SURGICAL HISTORY: Past Surgical History  Procedure Laterality Date  . Abdominal hysterectomy  2005  . Bone marrow biopsy  09/14/15  . Colonoscopy  07/05/2014  . Esophagogastroduodenoscopy  07/05/2014  . Portacath placement Right   . Peripheral vascular catheterization N/A 09/27/2015    Procedure: Glori Luis Cath Insertion;  Surgeon: Algernon Huxley, MD;  Location: [facility];  Service: Cardiovascular;  Laterality: N/A;  . Cholecystectomy  1997  . Inguinal lymph node biopsy Right 10/05/2015    Procedure: INGUINAL LYMPH NODE BIOPSY;  Surgeon: Christene Lye, MD;  Location: [facility];  Service: General;  Laterality: Right;    SOCIAL HISTORY: Social History   Social History  . Marital Status: Married    Spouse Name: N/A  . Number of Children: N/A  . Years of Education: N/A   Occupational History  . Not on file.   Social History Main Topics  . Smoking status: Never Smoker   . Smokeless tobacco: Never Used  . Alcohol  Use: No  . Drug Use: No  . Sexual Activity: Not on file   Other Topics Concern  . Not on file   Social History Narrative   Married   Does not get regular exercise    FAMILY HISTORY: Family History  Problem Relation Age of Onset  . Diabetes Neg Hx   . Heart disease Father     CABG x 4  . Hypertension Mother   . Pancreatic cancer Mother   . Ulcers Mother   . Asthma Mother     ALLERGIES:  has No Known Allergies.  MEDICATIONS:  Current Outpatient Prescriptions  Medication  Sig Dispense Refill  . apixaban (ELIQUIS) 5 MG TABS tablet Take 2 tablets (10 mg total) by mouth 2 (two) times daily. Till 10/26/15 60 tablet 1  . [START ON 10/27/2015] apixaban (ELIQUIS) 5 MG TABS tablet Take 1 tablet (5 mg total) by mouth 2 (two) times daily. From 10/27/15 60 tablet 0  . diazepam (VALIUM) 5 MG tablet Take 5 mg by mouth every 8 (eight) hours as needed (for vertigo).    Marland Kitchen lidocaine-prilocaine (EMLA) cream Apply 1 application topically as needed (prior to accessing port).    . ondansetron (ZOFRAN) 8 MG tablet Take 8 mg by mouth every 8 (eight) hours as needed for nausea or vomiting.    . predniSONE (DELTASONE) 50 MG tablet Take 100 mg by mouth daily with breakfast. Pt takes for five days starting the day of chemo.    . prochlorperazine (COMPAZINE) 10 MG tablet Take 1 tablet (10 mg total) by mouth every 6 (six) hours as needed for nausea or vomiting. 40 tablet 1  . ranitidine (ZANTAC) 150 MG tablet Take 150 mg by mouth 2 (two) times daily.     Marland Kitchen oxyCODONE-acetaminophen (PERCOCET) 7.5-325 MG tablet Take 1 tablet by mouth every 6 (six) hours as needed for severe pain. (Patient not taking: Reported on 10/24/2015) 60 tablet 0   No current facility-administered medications for this visit.   Facility-Administered Medications Ordered in Other Visits  Medication Dose Route Frequency Provider Last Rate Last Dose  . 0.9 %  sodium chloride infusion   Intravenous Continuous Evlyn Kanner, NP   Stopped at 10/14/15 1312      .  PHYSICAL EXAMINATION:   Filed Vitals:   10/24/15 1355  BP: 114/69  Pulse: 94  Temp: 97 F (36.1 C)   Filed Weights   10/24/15 1355  Weight: 158 lb 9.6 oz (71.94 kg)    GENERAL: Well-nourished well-developed; Alert, no distress.  Accompanied by her husband.  EYES: no pallor or icterus OROPHARYNX: no thrush; good dentition; ~0.5cm ulcer roof of mouth LUNGS: clear to auscultation and  No wheeze or crackles HEART/CVS: regular rate & rhythm and no murmurs;  No lower extremity edema ABDOMEN: abdomen soft, non-tender and normal bowel sounds Musculoskeletal:no cyanosis of digits and no clubbing  PSYCH: alert & oriented x 3 with fluent speech NEURO: no focal motor/sensory deficits;  SKIN:  no rashes or significant lesions  LABORATORY DATA:  I have reviewed the data as listed Lab Results  Component Value Date   WBC 9.6 10/24/2015   HGB 10.1* 10/24/2015   HCT 29.4* 10/24/2015   MCV 88.0 10/24/2015   PLT 435 10/24/2015    Recent Labs  10/14/15 1114 10/19/15 0653 10/20/15 0237 10/24/15 1430  NA 137 136  --  137  K 4.1 3.5  --  3.8  CL 100* 103  --  102  CO2 29  25  --  28  GLUCOSE 109* 121*  --  114*  BUN 10 7  --  14  CREATININE 0.71 0.76  --  0.69  CALCIUM 9.0 8.2*  --  9.1  GFRNONAA >60 >60  --  >60  GFRAA >60 >60  --  >60  PROT  --  5.9* 5.5* 7.2  ALBUMIN  --  3.3* 3.2* 3.5  AST  --  353* 61* 30  ALT  --  302* 183* 112*  ALKPHOS  --  244* 199* 451*  BILITOT  --  0.3 0.3 0.3  BILIDIR  --   --  <0.1*  --   IBILI  --   --  NOT CALCULATED  --     RADIOGRAPHIC STUDIES: I have personally reviewed the radiological images as listed and agreed with the findings in the report. Dg Chest 2 View  10/19/2015  CLINICAL DATA:  Fever today. History of current non-Hodgkin's lymphoma. EXAM: CHEST  2 VIEW COMPARISON:  08/14/2015 FINDINGS: Right IJ Port-A-Cath has tip over the SVC. Lungs are adequately inflated without consolidation or effusion. Cardiomediastinal silhouette and remainder of the exam is unchanged. IMPRESSION: No active cardiopulmonary disease. Electronically Signed   By: Marin Olp M.D.   On: 10/19/2015 13:16   Ct Head Wo Contrast  09/30/2015  CLINICAL DATA:  Non-Hodgkin's lymphoma. Lumbar puncture 1 day prior. Headache. EXAM: CT HEAD WITHOUT CONTRAST TECHNIQUE: Contiguous axial images were obtained from the base of the skull through the vertex without intravenous contrast. COMPARISON:  None. FINDINGS: No acute intracranial  hemorrhage. No focal mass lesion. No CT evidence of acute infarction. No midline shift or mass effect. No hydrocephalus. Basilar cisterns are patent. Paranasal sinuses and mastoid air cells are clear. Orbits are clear. IMPRESSION: No acute intracranial findings.  Normal ventricular volume. Electronically Signed   By: Suzy Bouchard M.D.   On: 09/30/2015 09:31   Mr Jeri Cos CL Contrast  09/26/2015  CLINICAL DATA:  Large B-cell lymphoma, recently diagnosed. EXAM: MRI HEAD WITHOUT AND WITH CONTRAST TECHNIQUE: Multiplanar, multiecho pulse sequences of the brain and surrounding structures were obtained without and with intravenous contrast. CONTRAST:  91m MULTIHANCE GADOBENATE DIMEGLUMINE 529 MG/ML IV SOLN COMPARISON:  None. FINDINGS: There is no evidence of acute infarct, intracranial hemorrhage, mass, midline shift, or extra-axial fluid collection. Ventricles and sulci are normal. No significant cerebral white matter disease is seen for age. No abnormal brain parenchymal or meningeal enhancement is identified. Orbits are unremarkable. No significant paranasal sinus or mastoid inflammatory disease is seen. Major intracranial vascular flow voids are preserved. IMPRESSION: Unremarkable appearance of the brain for age. No evidence of intracranial lymphoma. Electronically Signed   By: ALogan BoresM.D.   On: 09/26/2015 11:21   Nm Myocar Multi W/spect W/wall Motion / Ef  10/03/2015   T wave inversion was noted during stress in the II and III leads, beginning at 1 minutes of stress, ending at 2 minutes of stress.  There was no ST segment deviation noted during stress.  The study is normal.  This is a low risk study.  The left ventricular ejection fraction is normal (55-65%).    Nm Pet Image Initial (pi) Skull Base To Thigh  09/26/2015  CLINICAL DATA:  Initial treatment strategy for large B-cell lymphoma. EXAM: NUCLEAR MEDICINE PET SKULL BASE TO THIGH TECHNIQUE: 11.5 mCi F-18 FDG was injected intravenously.  Full-ring PET imaging was performed from the skull base to thigh after the radiotracer. CT data was obtained  and used for attenuation correction and anatomic localization. FASTING BLOOD GLUCOSE:  Value: 77 mg/dl COMPARISON:  CT chest abdomen pelvis 09/09/2015. FINDINGS: NECK There is mild asymmetric hypermetabolism in the right nasopharynx, which may be tonsillar in origin. Difficult to exclude lymphomatous involvement. Numerous hypermetabolic lymph nodes are seen throughout the neck bilaterally. Index right level 5 lymph node measures 1.4 cm (CT image 43) with an SUV max of 14.0. CT images show no acute findings. CHEST Hypermetabolic supraclavicular, mediastinal, hilar, subpectoral and axillary adenopathy is seen. Index low right supraclavicular lymph node measures 12 mm (CT image 57) with an SUV max of 16.5. Right subpectoral lymph node measures 11 mm (CT image 69) with an SUV of 11.1. Subcarinal lymph node measures approximately 11 mm with an SUV max of 7.3. No hypermetabolic pulmonary nodules. CT images of show no pericardial or pleural effusion. ABDOMEN/PELVIS Spleen is enlarged and markedly hypermetabolic, with an SUV max of 21.2. There are hypermetabolic porta hepatis, abdominal peritoneal ligament, mesenteric and retroperitoneal lymph nodes. Index porta hepatis lymph node measures approximately 10 mm (CT image 135) with an SUV max of 6.0. Index aortocaval lymph node measures 9 mm (CT image 156) with an SUV max of 7.8. Hypermetabolic lymph nodes extend along the abdominal aorta, iliac chains and inguinal regions bilaterally. Index right external iliac lymph node measures 12 mm (CT image 206) with an SUV max of 12.3. No abnormal hypermetabolism in the liver, adrenal glands or pancreas. CT images show the liver to be grossly unremarkable. Cholecystectomy. Adrenal glands, kidneys, pancreas, stomach and bowel are grossly unremarkable. No free fluid. SKELETON There is diffuse osseous hypermetabolism. IMPRESSION:  1. Hypermetabolic adenopathy throughout the neck, chest, abdomen and pelvis with splenic enlargement/hypermetabolism and diffuse osseous hypermetabolism. Findings are consistent with the given diagnosis of lymphoma. 2. Mild hypermetabolism within asymmetric right nasopharyngeal soft tissue, which may be tonsillar in origin. Difficult to exclude lymphomatous involvement. Continued attention on followup exams is warranted. Electronically Signed   By: Lorin Picket M.D.   On: 09/26/2015 11:07   US Venous Img Lower Bilateral  10/19/2015  CLINICAL DATA:  Bilateral lower extremity swelling and pain for 2 days, left side greater than right. Lymphoma. Varicose veins. EXAM: BILATERAL LOWER EXTREMITY VENOUS DOPPLER ULTRASOUND TECHNIQUE: Gray-scale sonography with graded compression, as well as color Doppler and duplex ultrasound were performed to evaluate the lower extremity deep venous systems from the level of the common femoral vein and including the common femoral, femoral, profunda femoral, popliteal and calf veins including the posterior tibial, peroneal and gastrocnemius veins when visible. The superficial great saphenous vein was also interrogated. Spectral Doppler was utilized to evaluate flow at rest and with distal augmentation maneuvers in the common femoral, femoral and popliteal veins. COMPARISON:  None. FINDINGS: RIGHT LOWER EXTREMITY Common Femoral Vein: No evidence of thrombus. Normal compressibility, respiratory phasicity and response to augmentation. Saphenofemoral Junction: No evidence of thrombus. Normal compressibility and flow on color Doppler imaging. Profunda Femoral Vein: No evidence of thrombus. Normal compressibility and flow on color Doppler imaging. Femoral Vein: No evidence of thrombus. Normal compressibility, respiratory phasicity and response to augmentation. Popliteal Vein: No evidence of thrombus. Normal compressibility, respiratory phasicity and response to augmentation. Calf Veins: No  evidence of thrombus. Normal compressibility and flow on color Doppler imaging. Superficial Great Saphenous Vein: No evidence of thrombus. Normal compressibility and flow on color Doppler imaging. Venous Reflux:  None. Other Findings: A rounded lymph node with lack of fatty hilum is seen in the right inguinal  region measuring 1.3 by 0.8 x 0.9 cm. Two small fluid collections are also seen in the right groin measuring approximately 1-2 cm at site of recent lymph node resection, consistent with small postop seromas or lymphoceles. LEFT LOWER EXTREMITY Common Femoral Vein: No evidence of thrombus. Normal compressibility, respiratory phasicity and response to augmentation. Saphenofemoral Junction: No evidence of thrombus. Normal compressibility and flow on color Doppler imaging. Profunda Femoral Vein: No evidence of thrombus. Normal compressibility and flow on color Doppler imaging. Femoral Vein: No evidence of thrombus. Normal compressibility, respiratory phasicity and response to augmentation. Popliteal Vein: No evidence of thrombus. Normal compressibility, respiratory phasicity and response to augmentation. Calf Veins: Distended noncompressible vein is seen in the left mid calf, which is likely a branch of the peroneal vein, consistent with DVT. Other visualized calf veins are compressible and unremarkable in appearance. Superficial Great Saphenous Vein: No evidence of thrombus. Normal compressibility and flow on color Doppler imaging. Venous Reflux:  None. Other Findings:  None. IMPRESSION: Probable DVT in single left mid calf vein, which is likely a branch of the peroneal vein. No other sites of DVT identified in either lower extremity. Mildly enlarged right inguinal lymph node, consistent with history of lymphoma. Two small fluid collections in right groin at site recent lymph node resection, consistent with postop seromas or lymphoceles. Electronically Signed   By: Earle Gell M.D.   On: 10/19/2015 09:08   US  Abdomen Limited Ruq  10/19/2015  CLINICAL DATA:  Elevated liver function studies, history of lymphoma ; began chemotherapy 2 weeks ago; remote history of cholecystectomy. EXAM: US ABDOMEN LIMITED - RIGHT UPPER QUADRANT COMPARISON:  PET-CT study of Sep 26, 2015 short FINDINGS: Gallbladder: No gallstones or wall thickening visualized. No sonographic Murphy sign noted by sonographer. Common bile duct: Diameter: 9.9 mm Liver: The hepatic echotexture is normal. There is no focal mass nor ductal dilation. IMPRESSION: Common bile duct dilation not out of proportion to the post cholecystectomy state. No intraluminal stones are observed. No intrahepatic ductal dilation. No hepatic mass or other acute hepatic abnormality. Electronically Signed   By: David  Martinique M.D.   On: 10/19/2015 08:54   Dg Fluoro Guided Loc Of Needle/cath Tip For Spinal Inject Lt  09/28/2015  CLINICAL DATA:  Lymphoma EXAM: DIAGNOSTIC LUMBAR PUNCTURE UNDER FLUOROSCOPIC GUIDANCE FLUOROSCOPY TIME:  7 minutes and 48 seconds. PROCEDURE: Informed consent was obtained from the patient prior to the procedure, including potential complications of headache, allergy, and pain. With the patient prone, the lower back was prepped with Betadine. Initial attempts at lumbar puncture were performed by Dr. David Martinique. This was unsuccessful. Subsequently, I attempted lumbar puncture. The skin overlying the lumbar region was prepped and draped in a sterile fashion. 1% lidocaine was utilized for local anesthesia. Initially, a 20 gauge needle was advanced into the CSF space via left paramedian approach at L2-3. This yielded no fluid. Subsequently, needle placement at L2-3 on the right was performed. Two drops of serosanguineous fluid was obtained. FINDINGS: Image documents needle placement in the CSF space on the right at L2-3. COMPLICATIONS: None IMPRESSION: Lumbar puncture was performed. It was a very difficult procedure and only 2 drops serosanguineous fluid was  obtained. Electronically Signed   By: Marybelle Killings M.D.   On: 09/28/2015 13:17    ASSESSMENT & PLAN:   # Large B-cell lymphoma- intravascular type [based on BMBx];invlving peripheral blood.  # Proceed with R- CHOP chemotherapy on Thursday; 2-D echo within normal limits; hepatitis panel within normal  limits.   # Thrombocytopenia - Platelets WNL today.  # Stress ulcer/aphthous ulcer roof of mouth- continue benzocaine/sore baking soda rinses.   # Liver Enzymes improved. Will start Acyclovir 400 mg BID.   # Leg pain: Continue with Tylenol PRN for pain. Also continue with Eliquis for left DVT  # Constipation: Miralax  OTC daily.   # Post spinal headache-significantly improved. Continue  oxycodone caffeine as needed.  Follow up Thursday for MD and RHCOP, No labs needed.     Mayra Reel, NP 10/24/2015 3:37 PM

## 2015-10-24 NOTE — Progress Notes (Signed)
Patient here for follow up. Patient has recently been hospitalized for suspected blood clot in left leg. Since hospitalization patient has been severely constipated which required.  manual stool removal.

## 2015-10-24 NOTE — Telephone Encounter (Signed)
Called to report that she is no better with her leg and cannot walk. Also concerned that she was taken off the Valtrex and thinks she needs med to prevent blisters as she is to get chemo this Thursday. Please advise

## 2015-10-26 ENCOUNTER — Encounter: Payer: Self-pay | Admitting: General Surgery

## 2015-10-26 ENCOUNTER — Encounter: Payer: Self-pay | Admitting: Cardiology

## 2015-10-26 ENCOUNTER — Ambulatory Visit (INDEPENDENT_AMBULATORY_CARE_PROVIDER_SITE_OTHER): Payer: BLUE CROSS/BLUE SHIELD | Admitting: Cardiology

## 2015-10-26 ENCOUNTER — Ambulatory Visit: Payer: BLUE CROSS/BLUE SHIELD | Admitting: Cardiology

## 2015-10-26 VITALS — BP 110/68 | HR 95 | Ht 62.0 in | Wt 158.5 lb

## 2015-10-26 DIAGNOSIS — R002 Palpitations: Secondary | ICD-10-CM | POA: Diagnosis not present

## 2015-10-26 DIAGNOSIS — R0789 Other chest pain: Secondary | ICD-10-CM | POA: Diagnosis not present

## 2015-10-26 NOTE — Patient Instructions (Signed)
Medication Instructions:  Your physician recommends that you continue on your current medications as directed. Please refer to the Current Medication list given to you today.  Labwork: None ordered.  Testing/Procedures: None ordered.  Follow-Up: Your physician recommends that you schedule a follow-up appointment as needed.   Any Other Special Instructions Will Be Listed Below (If Applicable).     If you need a refill on your cardiac medications before your next appointment, please call your pharmacy.   

## 2015-10-26 NOTE — Progress Notes (Signed)
Cardiology Office Note   Date:  10/26/2015   ID:  Stacy Murillo, DOB 1956/02/06, MRN [id number]  Referring Doctor:  Rica Mast, MD   Cardiologist:   [name], MD   Reason for consultation:  Chief Complaint  Patient presents with  . other    Follow up from Echo, stress test and 24 hour holter monitor. Meds reviewed by the patient verbally.       History of Present Illness: Stacy Murillo is a 60 y.o. female who presents for Follow-up after testing.  Since initial visit, patient has been diagnosed with lymphoma. She is here to discuss the results of her cardiac workup which are essentially unremarkable.  She has no chest pain. Shortness of breath is the same as before.  Patient denies fever, cough, colds, abdominal pain. No PND, orthopnea, edema.  ROS:  Please see the history of present illness. Aside from mentioned under HPI, all other systems are reviewed and negative.    Past Medical History  Diagnosis Date  . Tachycardia   . Overweight(278.02)     Obesity  . Chest pain, unspecified   . Palpitations   . Osteoporosis   . Thrombocytopenia ([place])   . Lymphoma ([place]) 09/14/15    Monoclonal B cell lymphoma  . Diverticulosis 07/06/15  . Esophagitis 07/06/15  . Gastritis 07/06/15  . 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 GERD (gastroesophageal reflux disease)   . Fatigue   . Leg cramps   . Night sweats   . Cancer ([place])   . Dysrhythmia   . Shortness of breath dyspnea   . Arthritis   . Blood dyscrasia     Past Surgical History  Procedure Laterality Date  . Abdominal hysterectomy  2005  . Bone marrow biopsy  09/14/15  . Colonoscopy  07/05/2014  . Esophagogastroduodenoscopy  07/05/2014  . Portacath placement Right   . Peripheral vascular catheterization N/A 09/27/2015    Procedure: Glori Luis Cath Insertion;  Surgeon: Algernon Huxley, MD;  Location: [facility];  Service: Cardiovascular;  Laterality: N/A;  . Cholecystectomy  1997  . Inguinal lymph node biopsy  Right 10/05/2015    Procedure: INGUINAL LYMPH NODE BIOPSY;  Surgeon: Christene Lye, MD;  Location: [facility];  Service: General;  Laterality: Right;     reports that she has never smoked. She has never used smokeless tobacco. She reports that she does not drink alcohol or use illicit drugs.   family history includes Asthma in her mother; Heart disease in her father; Hypertension in her mother; Pancreatic cancer in her mother; Ulcers in her mother. There is no history of Diabetes. Father had quadruple bypass likely in his 20s.  Current Outpatient Prescriptions  Medication Sig Dispense Refill  . acyclovir (ZOVIRAX) 400 MG tablet Take 1 tablet (400 mg total) by mouth 2 (two) times daily. 60 tablet 3  . apixaban (ELIQUIS) 5 MG TABS tablet Take 2 tablets (10 mg total) by mouth 2 (two) times daily. Till 10/26/15 60 tablet 1  . [START ON 10/27/2015] apixaban (ELIQUIS) 5 MG TABS tablet Take 1 tablet (5 mg total) by mouth 2 (two) times daily. From 10/27/15 60 tablet 0  . diazepam (VALIUM) 5 MG tablet Take 5 mg by mouth every 8 (eight) hours as needed (for vertigo).    Marland Kitchen lidocaine-prilocaine (EMLA) cream Apply 1 application topically as needed (prior to accessing port).    . ondansetron (ZOFRAN) 8 MG tablet Take 8 mg by mouth every 8 (eight) hours  as needed for nausea or vomiting.    Marland Kitchen oxyCODONE-acetaminophen (PERCOCET) 7.5-325 MG tablet Take 1 tablet by mouth every 6 (six) hours as needed for severe pain. 60 tablet 0  . predniSONE (DELTASONE) 50 MG tablet Take 100 mg by mouth daily with breakfast. Pt takes for five days starting the day of chemo.    . prochlorperazine (COMPAZINE) 10 MG tablet Take 1 tablet (10 mg total) by mouth every 6 (six) hours as needed for nausea or vomiting. 40 tablet 1  . ranitidine (ZANTAC) 150 MG tablet Take 150 mg by mouth 2 (two) times daily.      No current facility-administered medications for this visit.   Facility-Administered Medications Ordered in Other Visits    Medication Dose Route Frequency Provider Last Rate Last Dose  . 0.9 %  sodium chloride infusion   Intravenous Continuous Evlyn Kanner, NP   Stopped at 10/14/15 1312    Allergies: Review of patient's allergies indicates no known allergies.    PHYSICAL EXAM: VS:  BP 110/68 mmHg  Pulse 95  Ht 5' 2"  (1.575 m)  Wt 158 lb 8 oz (71.895 kg)  BMI 28.98 kg/m2  SpO2 99% , Body mass index is 28.98 kg/(m^2). Wt Readings from Last 3 Encounters:  10/26/15 158 lb 8 oz (71.895 kg)  10/24/15 158 lb 9.6 oz (71.94 kg)  10/19/15 160 lb 8 oz (72.802 kg)    GENERAL:  well developed, well nourished, not in acute distress HEENT: normocephalic, pink conjunctivae, anicteric sclerae, no xanthelasma, normal dentition, oropharynx clear NECK:  no neck vein engorgement, JVP normal, no hepatojugular reflux, carotid upstroke brisk and symmetric, no bruit, no thyromegaly, no lymphadenopathy LUNGS:  good respiratory effort, clear to auscultation bilaterally CV:  PMI not displaced, no thrills, no lifts, S1 and S2 within normal limits, no palpable S3 or S4, no murmurs, no rubs, no gallops ABD:  Soft, nontender, nondistended, normoactive bowel sounds, no abdominal aortic bruit, no hepatomegaly, no splenomegaly MS: nontender back, no kyphosis, no scoliosis, no joint deformities EXT:  2+ DP/PT pulses, no edema, no varicosities, no cyanosis, no clubbing SKIN: warm, nondiaphoretic, normal turgor, no ulcers NEUROPSYCH: alert, oriented to person, place, and time, sensory/motor grossly intact, normal mood, appropriate affect  Recent Labs: 08/24/2015: TSH 3.33 10/19/2015: Magnesium 1.6* 10/24/2015: ALT 112*; BUN 14; Creatinine, Ser 0.69; Hemoglobin 10.1*; Platelets 435; Potassium 3.8; Sodium 137   Lipid Panel    Component Value Date/Time   CHOL 220* 08/18/2014 0805   TRIG 142.0 08/18/2014 0805   HDL 48.80 08/18/2014 0805   CHOLHDL 5 08/18/2014 0805   VLDL 28.4 08/18/2014 0805   LDLCALC 143* 08/18/2014 0805    LDLDIRECT 139.5 07/17/2012 0957     Other studies Reviewed:  EKG:  The ekg from05/07/2015 was personally reviewed by me and it revealed sinus rhythm, 92 BPM  Additional studies/ records that were reviewed personally reviewed by me today include:  Echo 10/05/2015: Left ventricle: The cavity size was normal. Systolic function was  normal. The estimated ejection fraction was in the range of 60%  to 65%. Wall motion was normal; there were no regional wall  motion abnormalities. Left ventricular diastolic function  parameters were normal. - Aorta: Aortic root dimension: 35 mm (ED). - Aortic root: The aortic root was mildly dilated - Mitral valve: There was mild regurgitation. - Left atrium: The atrium was normal in size. - Right ventricle: Systolic function was normal. - Pulmonary arteries: Systolic pressure was within the normal  range.  holter  10/14/2015: Overall rhythm was sinus. The heart is ranged from 64 to 1:15 bpm, average of 81 BPM.  No high-grade ventricular ectopy: 29 isolated PVCs. No ventricular runs.  No high-grade supraventricular ectopy: 71 isolated PACs. 3 atrial pairs. No atrial runs.  No evidence of AV conduction disease. Longest RR interval was normal at 1.2 seconds.  No evidence of atrial fibrillation.     Stress test 10/03/2015:  T wave inversion was noted during stress in the II and III leads, beginning at 1 minutes of stress, ending at 2 minutes of stress.  There was no ST segment deviation noted during stress.  The study is normal.  This is a low risk study.  The left ventricular ejection fraction is normal (55-65%).   ASSESSMENT AND PLAN:  Palpitations Labs reviewed. TSH and electrolytes were within normal limits from April 2017. Holter monitor Is unremarkable, no high-grade ventricular or supraventricular ectopy. Echocardiogram shows preserved LV ejection fraction. Discussed findings with patient. Patient reassured.  Chest pain and  shortness of breath Risk factors for CAD include age/postmenopausal state, hyperlipidemia, obesity. Patient says she cannot walk on the treadmill due to bilateral knee pains. pharmacologic stress test revealed no evidence of ischemia, it was a low risk study. Discuss findings with patient. Patient reassured.  She has only gone through one cycle of chemotherapy. She is going for another one tomorrow. May consider low-dose ACE inhibitor and the several 2.5 or 5 mg by mouth daily, if blood pressure tolerates and no significant electrolyte abnormalities.   Current medicines are reviewed at length with the patient today.  The patient does not have concerns regarding medicines.  Labs/ tests ordered today include:  No orders of the defined types were placed in this encounter.    I had a lengthy and detailed discussion with the patient regarding diagnoses, prognosis, diagnostic options, treatment options .     Disposition:   FU with undersigned when necessary/per oncologist   Signed, [name], MD  10/26/2015 2:15 PM    [place]

## 2015-10-27 ENCOUNTER — Inpatient Hospital Stay: Payer: BLUE CROSS/BLUE SHIELD

## 2015-10-27 ENCOUNTER — Inpatient Hospital Stay (HOSPITAL_BASED_OUTPATIENT_CLINIC_OR_DEPARTMENT_OTHER): Payer: BLUE CROSS/BLUE SHIELD | Admitting: Internal Medicine

## 2015-10-27 ENCOUNTER — Encounter: Payer: Self-pay | Admitting: Internal Medicine

## 2015-10-27 VITALS — BP 117/67 | HR 91 | Temp 96.5°F | Resp 18 | Wt 157.0 lb

## 2015-10-27 DIAGNOSIS — Z79899 Other long term (current) drug therapy: Secondary | ICD-10-CM

## 2015-10-27 DIAGNOSIS — R1310 Dysphagia, unspecified: Secondary | ICD-10-CM | POA: Diagnosis not present

## 2015-10-27 DIAGNOSIS — R519 Headache, unspecified: Secondary | ICD-10-CM

## 2015-10-27 DIAGNOSIS — R51 Headache: Secondary | ICD-10-CM

## 2015-10-27 DIAGNOSIS — R7989 Other specified abnormal findings of blood chemistry: Secondary | ICD-10-CM | POA: Diagnosis not present

## 2015-10-27 DIAGNOSIS — I82402 Acute embolism and thrombosis of unspecified deep veins of left lower extremity: Secondary | ICD-10-CM

## 2015-10-27 DIAGNOSIS — R252 Cramp and spasm: Secondary | ICD-10-CM

## 2015-10-27 DIAGNOSIS — R945 Abnormal results of liver function studies: Secondary | ICD-10-CM

## 2015-10-27 DIAGNOSIS — Z86718 Personal history of other venous thrombosis and embolism: Secondary | ICD-10-CM

## 2015-10-27 DIAGNOSIS — K1230 Oral mucositis (ulcerative), unspecified: Secondary | ICD-10-CM | POA: Diagnosis not present

## 2015-10-27 DIAGNOSIS — C8583 Other specified types of non-Hodgkin lymphoma, intra-abdominal lymph nodes: Secondary | ICD-10-CM

## 2015-10-27 DIAGNOSIS — I82409 Acute embolism and thrombosis of unspecified deep veins of unspecified lower extremity: Secondary | ICD-10-CM | POA: Insufficient documentation

## 2015-10-27 DIAGNOSIS — K1231 Oral mucositis (ulcerative) due to antineoplastic therapy: Secondary | ICD-10-CM

## 2015-10-27 DIAGNOSIS — C8521 Mediastinal (thymic) large B-cell lymphoma, lymph nodes of head, face, and neck: Secondary | ICD-10-CM

## 2015-10-27 DIAGNOSIS — Z791 Long term (current) use of non-steroidal anti-inflammatories (NSAID): Secondary | ICD-10-CM

## 2015-10-27 LAB — CBC WITH DIFFERENTIAL/PLATELET
BASOS ABS: 0 10*3/uL (ref 0–0.1)
Basophils Relative: 0 %
EOS PCT: 0 %
Eosinophils Absolute: 0 10*3/uL (ref 0–0.7)
HCT: 27.5 % — ABNORMAL LOW (ref 35.0–47.0)
HEMOGLOBIN: 9.5 g/dL — AB (ref 12.0–16.0)
LYMPHS ABS: 0.3 10*3/uL — AB (ref 1.0–3.6)
LYMPHS PCT: 4 %
MCH: 30.3 pg (ref 26.0–34.0)
MCHC: 34.5 g/dL (ref 32.0–36.0)
MCV: 87.9 fL (ref 80.0–100.0)
Monocytes Absolute: 0.3 10*3/uL (ref 0.2–0.9)
Monocytes Relative: 4 %
NEUTROS PCT: 92 %
Neutro Abs: 6.8 10*3/uL — ABNORMAL HIGH (ref 1.4–6.5)
PLATELETS: 466 10*3/uL — AB (ref 150–440)
RBC: 3.13 MIL/uL — AB (ref 3.80–5.20)
RDW: 16.7 % — ABNORMAL HIGH (ref 11.5–14.5)
WBC: 7.4 10*3/uL (ref 3.6–11.0)

## 2015-10-27 LAB — COMPREHENSIVE METABOLIC PANEL
ALK PHOS: 292 U/L — AB (ref 38–126)
ALT: 74 U/L — AB (ref 14–54)
AST: 46 U/L — AB (ref 15–41)
Albumin: 3.5 g/dL (ref 3.5–5.0)
Anion gap: 9 (ref 5–15)
BUN: 15 mg/dL (ref 6–20)
CALCIUM: 9.1 mg/dL (ref 8.9–10.3)
CHLORIDE: 104 mmol/L (ref 101–111)
CO2: 27 mmol/L (ref 22–32)
CREATININE: 0.62 mg/dL (ref 0.44–1.00)
Glucose, Bld: 113 mg/dL — ABNORMAL HIGH (ref 65–99)
Potassium: 3.9 mmol/L (ref 3.5–5.1)
SODIUM: 140 mmol/L (ref 135–145)
Total Bilirubin: 0.6 mg/dL (ref 0.3–1.2)
Total Protein: 6.9 g/dL (ref 6.5–8.1)

## 2015-10-27 MED ORDER — SODIUM CHLORIDE 0.9% FLUSH
10.0000 mL | Freq: Once | INTRAVENOUS | Status: AC
  Administered 2015-10-27: 10 mL via INTRAVENOUS
  Filled 2015-10-27: qty 10

## 2015-10-27 MED ORDER — SODIUM CHLORIDE 0.9 % IV SOLN
1400.0000 mg | Freq: Once | INTRAVENOUS | Status: AC
  Administered 2015-10-27: 1400 mg via INTRAVENOUS
  Filled 2015-10-27: qty 50

## 2015-10-27 MED ORDER — HEPARIN SOD (PORK) LOCK FLUSH 100 UNIT/ML IV SOLN
500.0000 [IU] | Freq: Once | INTRAVENOUS | Status: AC
  Administered 2015-10-27: 500 [IU] via INTRAVENOUS
  Filled 2015-10-27: qty 5

## 2015-10-27 MED ORDER — VINCRISTINE SULFATE CHEMO INJECTION 1 MG/ML
2.0000 mg | Freq: Once | INTRAVENOUS | Status: AC
  Administered 2015-10-27: 2 mg via INTRAVENOUS
  Filled 2015-10-27: qty 2

## 2015-10-27 MED ORDER — PEGFILGRASTIM 6 MG/0.6ML ~~LOC~~ PSKT
6.0000 mg | PREFILLED_SYRINGE | Freq: Once | SUBCUTANEOUS | Status: AC
  Administered 2015-10-27: 6 mg via SUBCUTANEOUS
  Filled 2015-10-27: qty 0.6

## 2015-10-27 MED ORDER — SODIUM CHLORIDE 0.9 % IV SOLN
375.0000 mg/m2 | Freq: Once | INTRAVENOUS | Status: AC
  Administered 2015-10-27: 700 mg via INTRAVENOUS
  Filled 2015-10-27: qty 60

## 2015-10-27 MED ORDER — DIPHENHYDRAMINE HCL 25 MG PO CAPS
50.0000 mg | ORAL_CAPSULE | Freq: Once | ORAL | Status: AC
  Administered 2015-10-27: 50 mg via ORAL
  Filled 2015-10-27: qty 2

## 2015-10-27 MED ORDER — DOXORUBICIN HCL CHEMO IV INJECTION 2 MG/ML
50.0000 mg/m2 | Freq: Once | INTRAVENOUS | Status: AC
Start: 2015-10-27 — End: 2015-10-27
  Administered 2015-10-27: 92 mg via INTRAVENOUS
  Filled 2015-10-27: qty 46

## 2015-10-27 MED ORDER — CYCLOBENZAPRINE HCL 5 MG PO TABS: 5.0000 mg | ORAL_TABLET | Freq: Three times a day (TID) | ORAL | Status: DC | PRN

## 2015-10-27 MED ORDER — ACETAMINOPHEN 325 MG PO TABS
650.0000 mg | ORAL_TABLET | Freq: Once | ORAL | Status: AC
  Administered 2015-10-27: 650 mg via ORAL
  Filled 2015-10-27: qty 2

## 2015-10-27 MED ORDER — DEXAMETHASONE SODIUM PHOSPHATE 100 MG/10ML IJ SOLN
10.0000 mg | Freq: Once | INTRAMUSCULAR | Status: AC
  Administered 2015-10-27: 10 mg via INTRAVENOUS
  Filled 2015-10-27: qty 1

## 2015-10-27 MED ORDER — SODIUM CHLORIDE 0.9 % IV SOLN
Freq: Once | INTRAVENOUS | Status: AC
  Administered 2015-10-27: 10:00:00 via INTRAVENOUS
  Filled 2015-10-27: qty 1000

## 2015-10-27 MED ORDER — APIXABAN 5 MG PO TABS: 5.0000 mg | ORAL_TABLET | Freq: Two times a day (BID) | ORAL | Status: DC

## 2015-10-27 MED ORDER — PALONOSETRON HCL INJECTION 0.25 MG/5ML
0.2500 mg | Freq: Once | INTRAVENOUS | Status: AC
  Administered 2015-10-27: 0.25 mg via INTRAVENOUS
  Filled 2015-10-27: qty 5

## 2015-10-27 NOTE — Progress Notes (Signed)
Patient wants to know if she can take a muscle relaxer for her legs.  States the pain in her legs feel muscular.  Also states she is getting water filled blisters on her lower legs.

## 2015-10-27 NOTE — Assessment & Plan Note (Addendum)
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 Awaiting Fish for MyC BCL-2 and BCL 6. Clinically patient seems to be responding to R CHOP chemotherapy status post cycle #1 so for-improvement of the platelet count/improvement of LDH.  However Given the aggressive lymphoma/multiple complications [C discussion below]- recommend PET scan after 2 cycles to evaluate the response to chemotherapy.  Patient also need intracranial prophylaxis-  given the high risk lymphoma /involvement of peripheral blood lymphoma cells.

## 2015-10-27 NOTE — Progress Notes (Signed)
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 CT- 1-2 CM LN subpectoral/medistinal/ retro-peritoneal/pelvic/ Right inguinal LN 1.6cm- Bx- DLBCL- ABC; myc-POS; awaiting FISH. PET- MULTIPLE LN/ Bone involvement; May 25th- R-CHOP q3 W  # Lumbar puncture- difficult-spinal headache.   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  In the interim patient's clinical course was complicated by multiple oral ulcers/oral thrush for which she was started on Diflucan and valacyclovir.  Patient was evaluated in the emergency room for bilateral lower extremity cramps left more than right-Doppler showed a small DVT below the calf on the left side. Given the symptomology patient was started on anticoagulation. Interestingly enough patient's LFTs  were elevated approximately 10 times AST ALT with normal bili. Valacyclovir Diflucan was discontinued.  Patient complains of mild cramping in her legs otherwise overall improved. No issues with bleeding. No nausea no vomiting.  She continues to have intermittent headaches; she states have no vision changes. Mild nausea no vomiting.  REVIEW OF SYSTEMS:  A complete 10 point review of system is done which is negative except mentioned above/history of present illness.   PAST MEDICAL HISTORY :  Past Medical History  Diagnosis Date  . Tachycardia   . Overweight(278.02)     Obesity  . Chest pain, unspecified   . Palpitations   . Osteoporosis   . Thrombocytopenia ([place])   . Lymphoma ([place]) 09/14/15    Monoclonal B cell lymphoma  . Diverticulosis 07/06/15  . Esophagitis 07/06/15  . Gastritis 07/06/15  . 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 GERD (gastroesophageal reflux disease)   . Fatigue   . Leg cramps   . Night sweats   . Cancer ([place])   . Dysrhythmia   . Shortness of breath dyspnea   . Arthritis   . Blood dyscrasia     PAST SURGICAL HISTORY :   Past Surgical History  Procedure Laterality Date  . Abdominal hysterectomy  2005  . Bone marrow biopsy  09/14/15  . Colonoscopy  07/05/2014  . Esophagogastroduodenoscopy  07/05/2014  . Portacath placement Right   . Peripheral vascular catheterization N/A 09/27/2015    Procedure: Glori Luis Cath Insertion;  Surgeon: Algernon Huxley, MD;  Location: [facility];  Service: Cardiovascular;  Laterality: N/A;  . Cholecystectomy  1997  . Inguinal lymph node biopsy Right 10/05/2015    Procedure: INGUINAL LYMPH NODE BIOPSY;  Surgeon: Andreas Newport  Jamal Collin, MD;  Location: [facility];  Service: General;  Laterality: Right;    FAMILY HISTORY :   Family History  Problem Relation Age of Onset  . Diabetes Neg Hx   . Heart disease Father     CABG x 4  . Hypertension Mother   . Pancreatic cancer Mother   . Ulcers Mother   . Asthma Mother     SOCIAL HISTORY:    Social History  Substance Use Topics  . Smoking status: Never Smoker   . Smokeless tobacco: Never Used  . Alcohol Use: No    ALLERGIES:  has No Known Allergies.  MEDICATIONS:  Current Outpatient Prescriptions  Medication Sig Dispense Refill  . acyclovir (ZOVIRAX) 400 MG tablet Take 1 tablet (400 mg total) by mouth 2 (two) times daily. 60 tablet 3  . apixaban (ELIQUIS) 5 MG TABS tablet Take 1 tablet (5 mg total) by mouth 2 (two) times daily. From 10/27/15 60 tablet 3  . diazepam (VALIUM) 5 MG tablet Take 5 mg by mouth every 8 (eight) hours as needed (for vertigo).    Marland Kitchen lidocaine-prilocaine (EMLA) cream Apply 1 application topically as needed (prior to accessing port).    . ondansetron (ZOFRAN) 8 MG tablet Take 8 mg by mouth every 8 (eight) hours as needed for nausea or vomiting.    Marland Kitchen oxyCODONE-acetaminophen (PERCOCET) 7.5-325 MG tablet Take 1 tablet by mouth every 6 (six) hours as needed for severe pain. 60 tablet 0  . predniSONE (DELTASONE) 50 MG tablet Take 100 mg by mouth daily with breakfast. Pt takes for five days starting the day of chemo.    . prochlorperazine (COMPAZINE) 10 MG tablet Take 1 tablet (10 mg total) by mouth every 6 (six) hours as needed for nausea or vomiting. 40 tablet 1  . ranitidine (ZANTAC) 150 MG tablet Take 150 mg by mouth 2 (two) times daily.     . cyclobenzaprine (FLEXERIL) 5 MG tablet Take 1 tablet (5 mg total) by mouth 3 (three) times daily as needed for muscle spasms. 30 tablet 0   No current facility-administered medications for this visit.   Facility-Administered Medications Ordered in Other Visits  Medication Dose Route Frequency Provider Last Rate Last Dose  . 0.9 %  sodium chloride infusion   Intravenous Continuous Evlyn Kanner, NP   Stopped at 10/14/15 1312    PHYSICAL EXAMINATION: ECOG PERFORMANCE STATUS: 1 - Symptomatic but completely ambulatory  BP 117/67 mmHg  Pulse 91  Temp(Src) 96.5 F (35.8 C) (Tympanic)  Resp 18  Wt 156 lb 15.5  oz (71.2 kg)  Filed Weights   10/27/15 0858  Weight: 156 lb 15.5 oz (71.2 kg)    GENERAL: Well-nourished well-developed; Alert, no distress and comfortable.   Accompanied by her husband.  EYES: no pallor or icterus OROPHARYNX: no thrush; superficial 1 cm also noted in the hard palate. good dentition  NECK: supple, no masses felt LYMPH:  no palpable lymphadenopathy in the cervical, axillary or inguinal regions LUNGS: clear to auscultation and  No wheeze or crackles HEART/CVS: regular rate & rhythm and no murmurs; 1+ lower extremity edema ABDOMEN:abdomen soft, non-tender and normal bowel sounds Musculoskeletal:no cyanosis of digits and no clubbing  PSYCH: alert & oriented x 3 with fluent speech NEURO: no focal motor/sensory deficits SKIN:  no rashes or significant lesions; approximately 5 mm 2-3 fluid-filled blisters noted.   LABORATORY DATA:  I have reviewed the data as listed    Component Value Date/Time   NA 140  10/27/2015 0840   NA 139 06/29/2010 0749   K 3.9 10/27/2015 0840   CL 104 10/27/2015 0840   CO2 27 10/27/2015 0840   GLUCOSE 113* 10/27/2015 0840   BUN 15 10/27/2015 0840   BUN 16 06/29/2010 0749   CREATININE 0.62 10/27/2015 0840   CREATININE 0.9 06/29/2010 0749   CALCIUM 9.1 10/27/2015 0840   PROT 6.9 10/27/2015 0840   ALBUMIN 3.5 10/27/2015 0840   AST 46* 10/27/2015 0840   ALT 74* 10/27/2015 0840   ALKPHOS 292* 10/27/2015 0840   BILITOT 0.6 10/27/2015 0840   GFRNONAA >60 10/27/2015 0840   GFRAA >60 10/27/2015 0840    No results found for: SPEP, UPEP  Lab Results  Component Value Date   WBC 7.4 10/27/2015   NEUTROABS 6.8* 10/27/2015   HGB 9.5* 10/27/2015   HCT 27.5* 10/27/2015   MCV 87.9 10/27/2015   PLT 466* 10/27/2015      Chemistry      Component Value Date/Time   NA 140 10/27/2015 0840   NA 139 06/29/2010 0749   K 3.9 10/27/2015 0840   CL 104 10/27/2015 0840   CO2 27 10/27/2015 0840   BUN 15 10/27/2015 0840   BUN 16 06/29/2010 0749    CREATININE 0.62 10/27/2015 0840   CREATININE 0.9 06/29/2010 0749   GLU 93 06/29/2010 0749      Component Value Date/Time   CALCIUM 9.1 10/27/2015 0840   ALKPHOS 292* 10/27/2015 0840   AST 46* 10/27/2015 0840   ALT 74* 10/27/2015 0840   BILITOT 0.6 10/27/2015 0840       RADIOGRAPHIC STUDIES: I have personally reviewed the radiological images as listed and agreed with the findings in the report. No results found.   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 Awaiting Fish for MyC BCL-2 and BCL 6. Clinically patient seems to be responding to R CHOP chemotherapy status post cycle #1 so for-improvement of the platelet count/improvement of LDH.  However Given the aggressive lymphoma/multiple complications [C discussion below]- recommend PET scan after 2 cycles to evaluate the response to chemotherapy.  Patient also need intracranial prophylaxis-  given the high risk lymphoma /involvement of peripheral blood lymphoma cells.   Muscle cramps Unclear etiology; unlikely DVT related. Recommend Flexeril.  Deep vein thrombosis (DVT) of lower extremity (HCC) Left lower extremity DVT/small peroneal. On Eliquis as she is significantly symptomatic. Some improvement noted in the pain.  Elevated LFTs Likely secondary to valacyclovir/Diflucan- currently improving. However alkaline phosphatase elevated in 400 range. Unclear etiology awaiting for the labs from today.  Oral mucositis due to antineoplastic therapy From systemic chemotherapy; currently improved. Patient is on prophylactic acyclovir.   Generalized headaches Likely post spinal headache. However we will have to watch closely for lymphomatous meningitis-given the high-risk lymphoma.     Orders Placed This Encounter  Procedures  . NM PET Image Restag (PS) Skull Base To Thigh    Standing Status:  Future     Number of Occurrences:      Standing Expiration Date: 12/26/2016    Order Specific Question:  Reason for Exam (SYMPTOM  OR DIAGNOSIS REQUIRED)    Answer:  re-staging lymphoma    Order Specific Question:  Preferred imaging location?    Answer:  [facility]  . CBC with Differential/Platelet    Standing Status: Future     Number of Occurrences:  Standing Expiration Date: 11/30/2016  . Comprehensive metabolic panel    Standing Status: Future     Number of Occurrences:      Standing Expiration Date: 11/30/2016  . CBC with Differential    Standing Status: Future     Number of Occurrences:      Standing Expiration Date: 10/26/2016  . Basic metabolic panel    Standing Status: Future     Number of Occurrences:      Standing Expiration Date: 10/26/2016    Order Specific Question:  Has the patient fasted?    Answer:  No  . Lactate dehydrogenase    Standing Status: Future     Number of Occurrences:      Standing Expiration Date: 10/26/2016  . CBC with Differential    Standing Status: Future     Number of Occurrences:      Standing Expiration Date: 10/26/2016  . Comprehensive metabolic panel    Standing Status: Future     Number of Occurrences:      Standing Expiration Date: 10/26/2016    Order Specific Question:  Has the patient fasted?    Answer:  No  . Lactate dehydrogenase    Standing Status: Future     Number of Occurrences:      Standing Expiration Date: 10/26/2016   All questions were answered. The patient knows to call the clinic with any problems, questions or concerns. Patient will follow-up with me in approximately 10 days.      Cammie Sickle, MD 10/27/2015 8:19 PM

## 2015-10-27 NOTE — Assessment & Plan Note (Signed)
Left lower extremity DVT/small peroneal. On Eliquis as she is significantly symptomatic. Some improvement noted in the pain.

## 2015-10-27 NOTE — Assessment & Plan Note (Addendum)
From systemic chemotherapy; currently improved. Patient is on prophylactic acyclovir.

## 2015-10-27 NOTE — Assessment & Plan Note (Signed)
Unclear etiology; unlikely DVT related. Recommend Flexeril.

## 2015-10-27 NOTE — Assessment & Plan Note (Signed)
Likely secondary to valacyclovir/Diflucan- currently improving. However alkaline phosphatase elevated in 400 range. Unclear etiology awaiting for the labs from today.

## 2015-10-27 NOTE — Assessment & Plan Note (Signed)
Likely post spinal headache. However we will have to watch closely for lymphomatous meningitis-given the high-risk lymphoma.

## 2015-11-07 ENCOUNTER — Encounter: Payer: Self-pay | Admitting: General Surgery

## 2015-11-07 ENCOUNTER — Encounter: Payer: Self-pay | Admitting: *Deleted

## 2015-11-07 ENCOUNTER — Inpatient Hospital Stay: Payer: BLUE CROSS/BLUE SHIELD

## 2015-11-07 ENCOUNTER — Inpatient Hospital Stay (HOSPITAL_BASED_OUTPATIENT_CLINIC_OR_DEPARTMENT_OTHER): Payer: BLUE CROSS/BLUE SHIELD | Admitting: Internal Medicine

## 2015-11-07 VITALS — BP 120/77 | HR 77 | Temp 98.0°F | Resp 18 | Wt 160.7 lb

## 2015-11-07 DIAGNOSIS — C8521 Mediastinal (thymic) large B-cell lymphoma, lymph nodes of head, face, and neck: Secondary | ICD-10-CM

## 2015-11-07 DIAGNOSIS — R1310 Dysphagia, unspecified: Secondary | ICD-10-CM | POA: Diagnosis not present

## 2015-11-07 DIAGNOSIS — K1230 Oral mucositis (ulcerative), unspecified: Secondary | ICD-10-CM

## 2015-11-07 DIAGNOSIS — C8583 Other specified types of non-Hodgkin lymphoma, intra-abdominal lymph nodes: Secondary | ICD-10-CM

## 2015-11-07 DIAGNOSIS — I82402 Acute embolism and thrombosis of unspecified deep veins of left lower extremity: Secondary | ICD-10-CM

## 2015-11-07 DIAGNOSIS — R7989 Other specified abnormal findings of blood chemistry: Secondary | ICD-10-CM | POA: Diagnosis not present

## 2015-11-07 DIAGNOSIS — R945 Abnormal results of liver function studies: Secondary | ICD-10-CM

## 2015-11-07 DIAGNOSIS — Z79899 Other long term (current) drug therapy: Secondary | ICD-10-CM

## 2015-11-07 DIAGNOSIS — Z7901 Long term (current) use of anticoagulants: Secondary | ICD-10-CM

## 2015-11-07 LAB — HEPATIC FUNCTION PANEL
ALBUMIN: 3.9 g/dL (ref 3.5–5.0)
ALK PHOS: 109 U/L (ref 38–126)
ALT: 19 U/L (ref 14–54)
AST: 20 U/L (ref 15–41)
BILIRUBIN TOTAL: 0.4 mg/dL (ref 0.3–1.2)
Bilirubin, Direct: <0.1 mg/dL — ABNORMAL LOW (ref 0.1–0.5)
Total Protein: 7.6 g/dL (ref 6.5–8.1)

## 2015-11-07 LAB — CBC WITH DIFFERENTIAL/PLATELET
Basophils Absolute: 0 10*3/uL (ref 0–0.1)
Basophils Relative: 0 %
EOS ABS: 0 10*3/uL (ref 0–0.7)
Eosinophils Relative: 0 %
HEMATOCRIT: 28 % — AB (ref 35.0–47.0)
HEMOGLOBIN: 9.4 g/dL — AB (ref 12.0–16.0)
LYMPHS ABS: 1.1 10*3/uL (ref 1.0–3.6)
Lymphocytes Relative: 9 %
MCH: 30.3 pg (ref 26.0–34.0)
MCHC: 33.6 g/dL (ref 32.0–36.0)
MCV: 90 fL (ref 80.0–100.0)
MONOS PCT: 8 %
Monocytes Absolute: 1 10*3/uL — ABNORMAL HIGH (ref 0.2–0.9)
NEUTROS ABS: 10.7 10*3/uL — AB (ref 1.4–6.5)
NEUTROS PCT: 83 %
Platelets: 124 10*3/uL — ABNORMAL LOW (ref 150–440)
RBC: 3.11 MIL/uL — AB (ref 3.80–5.20)
RDW: 16.6 % — ABNORMAL HIGH (ref 11.5–14.5)
WBC: 12.8 10*3/uL — AB (ref 3.6–11.0)

## 2015-11-07 LAB — LACTATE DEHYDROGENASE: LDH: 178 U/L (ref 98–192)

## 2015-11-07 LAB — BASIC METABOLIC PANEL
Anion gap: 6 (ref 5–15)
BUN: 11 mg/dL (ref 6–20)
CALCIUM: 8.9 mg/dL (ref 8.9–10.3)
CO2: 30 mmol/L (ref 22–32)
Chloride: 104 mmol/L (ref 101–111)
Creatinine, Ser: 0.68 mg/dL (ref 0.44–1.00)
GFR calc non Af Amer: >60 mL/min (ref >60)
GLUCOSE: 108 mg/dL — AB (ref 65–99)
POTASSIUM: 3.8 mmol/L (ref 3.5–5.1)
SODIUM: 140 mmol/L (ref 135–145)

## 2015-11-07 NOTE — Assessment & Plan Note (Addendum)
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 Awaiting Fish for MyC BCL-2 and BCL 6; I spoke to Dr.Onley today.  Clinically patient seems to be responding to R CHOP chemotherapy status post cycle #2 approximately 10 days ago-  so far-improvement of the platelet count/improvement of LDH. Overall general well-being.  # Await PET scan in the next 4 days. Proceed with cycle #3 on July 5th. Discussed that we will plan total of 6 cycles; but await the PET scan results.  # Given the presence of leukemic cells in the peripheral blood- discussed the risk of leptomeningeal relapse. LP was unsuccessful; brain MRI negative. I discussed multiple options including chemotherapy with LP; Ommaya port placement; and hospitalization with high-dose methotrexate. After discussing the pros and cons of each modality patient is interested in Hospitalization for high-dose methotrexate. We'll plan on day 14 cycle #3.     #  Left lower extremity DVT- improved on Eliquis.  # Elevated LFTs- secondary to valacyclovir/Diflucan back to baseline.  # Will also initiate a second opinion referral as patient wasn't able to make it to the [facility]- because of interim multiple complications needing admission the hospital.

## 2015-11-07 NOTE — Progress Notes (Signed)
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 CT- 1-2 CM LN subpectoral/medistinal/ retro-peritoneal/pelvic/ Right inguinal LN 1.6cm- Bx- DLBCL- ABC; myc-POS; awaiting FISH. PET- MULTIPLE LN/ Bone involvement; May 25th- R-CHOP q3 W  # Lumbar puncture- difficult-spinal headache.   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  For the first time patient has felt better after chemotherapy. Denies any sores in the mouth. Cramping in the legs resolved.  No nausea no vomiting. No chest pain or shortness of breath or cough. Denies any significant headaches. Appetite is good.   REVIEW OF SYSTEMS:  A complete 10 point review of system is done which is negative except mentioned above/history of present illness.   PAST  MEDICAL HISTORY :  Past Medical History  Diagnosis Date  . Tachycardia   . Overweight(278.02)     Obesity  . Chest pain, unspecified   . Palpitations   . Osteoporosis   . Thrombocytopenia ([place])   . 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 Diverticulosis 07/06/15  . Esophagitis 07/06/15  . Gastritis 07/06/15  . 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 GERD (gastroesophageal reflux disease)   . Fatigue   . Leg cramps   . Night sweats   . Cancer ([place])   . Dysrhythmia   . Shortness of breath dyspnea   . Arthritis   . Blood dyscrasia     PAST SURGICAL HISTORY :   Past Surgical History  Procedure Laterality Date  . Abdominal hysterectomy  2005  . Bone marrow biopsy  09/14/15  . Colonoscopy  07/05/2014  . Esophagogastroduodenoscopy  07/05/2014  . Portacath placement Right   . Peripheral vascular catheterization N/A 09/27/2015    Procedure: Glori Luis Cath Insertion;  Surgeon: Algernon Huxley, MD;  [facility];  Service: Cardiovascular;  Laterality: N/A;  . Cholecystectomy  1997  . Inguinal lymph node biopsy Right 10/05/2015    Procedure: INGUINAL LYMPH NODE BIOPSY;  Surgeon: Christene Lye, MD;  Location: [facility] ORS;  Service: General;  Laterality: Right;    FAMILY HISTORY :   Family History  Problem Relation Age of Onset  . Diabetes Neg Hx   . Heart disease Father     CABG x 4  . Hypertension Mother   . Pancreatic cancer Mother   . Ulcers Mother   . Asthma Mother  SOCIAL HISTORY:   Social History  Substance Use Topics  . Smoking status: Never Smoker   . Smokeless tobacco: Never Used  . Alcohol Use: No    ALLERGIES:  has No Known Allergies.  MEDICATIONS:  Current Outpatient Prescriptions  Medication Sig Dispense Refill  . acyclovir (ZOVIRAX) 400 MG tablet Take 1 tablet (400 mg total) by mouth 2 (two) times daily. 60 tablet 3  . apixaban (ELIQUIS) 5 MG TABS tablet Take 1 tablet (5 mg total) by mouth 2 (two) times daily. From 10/27/15 60 tablet  3  . cyclobenzaprine (FLEXERIL) 5 MG tablet Take 1 tablet (5 mg total) by mouth 3 (three) times daily as needed for muscle spasms. 30 tablet 0  . diazepam (VALIUM) 5 MG tablet Take 5 mg by mouth every 8 (eight) hours as needed (for vertigo).    Marland Kitchen lidocaine-prilocaine (EMLA) cream Apply 1 application topically as needed (prior to accessing port).    . ondansetron (ZOFRAN) 8 MG tablet Take 8 mg by mouth every 8 (eight) hours as needed for nausea or vomiting.    Marland Kitchen oxyCODONE-acetaminophen (PERCOCET) 7.5-325 MG tablet Take 1 tablet by mouth every 6 (six) hours as needed for severe pain. 60 tablet 0  . predniSONE (DELTASONE) 50 MG tablet Take 100 mg by mouth daily with breakfast. Pt takes for five days starting the day of chemo.    . prochlorperazine (COMPAZINE) 10 MG tablet Take 1 tablet (10 mg total) by mouth every 6 (six) hours as needed for nausea or vomiting. 40 tablet 1  . ranitidine (ZANTAC) 150 MG tablet Take 150 mg by mouth 2 (two) times daily.      No current facility-administered medications for this visit.   Facility-Administered Medications Ordered in Other Visits  Medication Dose Route Frequency Provider Last Rate Last Dose  . 0.9 %  sodium chloride infusion   Intravenous Continuous Evlyn Kanner, NP   Stopped at 10/14/15 1312    PHYSICAL EXAMINATION: ECOG PERFORMANCE STATUS: 1 - Symptomatic but completely ambulatory  BP 120/77 mmHg  Pulse 77  Temp(Src) 98 F (36.7 C) (Tympanic)  Resp 18  Wt 160 lb 11.5 oz (72.9 kg)  Filed Weights   11/07/15 1512  Weight: 160 lb 11.5 oz (72.9 kg)    GENERAL: Well-nourished well-developed; Alert, no distress and comfortable.   Accompanied by her husband.  EYES: no pallor or icterus OROPHARYNX: no thrush; superficial 1 cm also noted in the hard palate. good dentition  NECK: supple, no masses felt LYMPH:  no palpable lymphadenopathy in the cervical, axillary or inguinal regions LUNGS: clear to auscultation and  No wheeze or  crackles HEART/CVS: regular rate & rhythm and no murmurs; 1+ lower extremity edema ABDOMEN:abdomen soft, non-tender and normal bowel sounds Musculoskeletal:no cyanosis of digits and no clubbing  PSYCH: alert & oriented x 3 with fluent speech NEURO: no focal motor/sensory deficits SKIN:  no rashes or significant lesions;  LABORATORY DATA:  I have reviewed the data as listed    Component Value Date/Time   NA 140 11/07/2015 1440   NA 139 06/29/2010 0749   K 3.8 11/07/2015 1440   CL 104 11/07/2015 1440   CO2 30 11/07/2015 1440   GLUCOSE 108* 11/07/2015 1440   BUN 11 11/07/2015 1440   BUN 16 06/29/2010 0749   CREATININE 0.68 11/07/2015 1440   CREATININE 0.9 06/29/2010 0749   CALCIUM 8.9 11/07/2015 1440   PROT 7.6 11/07/2015 1440   ALBUMIN 3.9 11/07/2015 1440   AST 20  11/07/2015 1440   ALT 19 11/07/2015 1440   ALKPHOS 109 11/07/2015 1440   BILITOT 0.4 11/07/2015 1440   GFRNONAA >60 11/07/2015 1440   GFRAA >60 11/07/2015 1440    No results found for: SPEP, UPEP  Lab Results  Component Value Date   WBC 12.8* 11/07/2015   NEUTROABS 10.7* 11/07/2015   HGB 9.4* 11/07/2015   HCT 28.0* 11/07/2015   MCV 90.0 11/07/2015   PLT 124* 11/07/2015      Chemistry      Component Value Date/Time   NA 140 11/07/2015 1440   NA 139 06/29/2010 0749   K 3.8 11/07/2015 1440   CL 104 11/07/2015 1440   CO2 30 11/07/2015 1440   BUN 11 11/07/2015 1440   BUN 16 06/29/2010 0749   CREATININE 0.68 11/07/2015 1440   CREATININE 0.9 06/29/2010 0749   GLU 93 06/29/2010 0749      Component Value Date/Time   CALCIUM 8.9 11/07/2015 1440   ALKPHOS 109 11/07/2015 1440   AST 20 11/07/2015 1440   ALT 19 11/07/2015 1440   BILITOT 0.4 11/07/2015 1440       RADIOGRAPHIC STUDIES: I have personally reviewed the radiological images as listed and agreed with the findings in the report. No results found.   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 Awaiting Fish for MyC BCL-2 and BCL 6; I spoke to Dr.Onley today.  Clinically patient seems to be responding to R CHOP chemotherapy status post cycle #2 approximately 10 days ago-  so far-improvement of the platelet count/improvement of LDH. Overall general well-being.  # Await PET scan in the next 4 days. Proceed with cycle #3 on July 5th. Discussed that we will plan total of 6 cycles; but await the PET scan results.  # Given the presence of leukemic cells in the peripheral blood- discussed the risk of leptomeningeal relapse. LP was unsuccessful; brain MRI negative. I discussed multiple options including chemotherapy with LP; [place] port placement; and hospitalization with high-dose methotrexate. After discussing the pros and cons of each modality patient is interested in Hospitalization for high-dose methotrexate. We'll plan on day 14 cycle #3.     #  Left lower extremity DVT- improved on Eliquis.  # Elevated LFTs- secondary to valacyclovir/Diflucan back to baseline.  # Will also initiate a second opinion referral as patient wasn't able to make it to the [facility]- because of interim multiple complications needing admission the hospital.     Orders Placed This Encounter  Procedures  . Hepatic function panel    Add to labs drawn today    Standing Status: Future     Number of Occurrences: 1     Standing Expiration Date: 11/06/2016   All questions were answered. The patient knows to call the clinic with any problems, questions or concerns. Patient will follow-up with me in approximately 10 days.      Cammie Sickle, MD 11/07/2015 4:43 PM

## 2015-11-07 NOTE — Progress Notes (Signed)
Patient states she has had a good ten days. Does notice a little neuropathy in a couple of her fingers, otherwise no complaints.

## 2015-11-10 ENCOUNTER — Encounter: Payer: Self-pay | Admitting: Anatomic Pathology & Clinical Pathology

## 2015-11-11 ENCOUNTER — Telehealth: Payer: Self-pay | Admitting: Internal Medicine

## 2015-11-11 ENCOUNTER — Ambulatory Visit
Admission: RE | Admit: 2015-11-11 | Discharge: 2015-11-11 | Disposition: A | Discharge status: Home / Self Care | Payer: BLUE CROSS/BLUE SHIELD | Source: Ambulatory Visit | Attending: Internal Medicine | Admitting: Internal Medicine

## 2015-11-11 DIAGNOSIS — I517 Cardiomegaly: Secondary | ICD-10-CM | POA: Diagnosis not present

## 2015-11-11 DIAGNOSIS — I700 Atherosclerosis of aorta: Secondary | ICD-10-CM | POA: Insufficient documentation

## 2015-11-11 DIAGNOSIS — K5730 Diverticulosis of large intestine without perforation or abscess without bleeding: Secondary | ICD-10-CM | POA: Diagnosis not present

## 2015-11-11 DIAGNOSIS — C8583 Other specified types of non-Hodgkin lymphoma, intra-abdominal lymph nodes: Secondary | ICD-10-CM | POA: Insufficient documentation

## 2015-11-11 LAB — GLUCOSE, CAPILLARY: GLUCOSE-CAPILLARY: 97 mg/dL (ref 65–99)

## 2015-11-11 MED ORDER — FLUDEOXYGLUCOSE F - 18 (FDG) INJECTION
12.6000 | Freq: Once | INTRAVENOUS | Status: AC | PRN
  Administered 2015-11-11: 12.6 via INTRAVENOUS

## 2015-11-11 NOTE — Telephone Encounter (Signed)
Spoke to pt; PET scan significant response; follow up as planned. GB

## 2015-11-17 ENCOUNTER — Inpatient Hospital Stay (HOSPITAL_BASED_OUTPATIENT_CLINIC_OR_DEPARTMENT_OTHER): Payer: BLUE CROSS/BLUE SHIELD | Admitting: Internal Medicine

## 2015-11-17 ENCOUNTER — Inpatient Hospital Stay: Payer: BLUE CROSS/BLUE SHIELD

## 2015-11-17 ENCOUNTER — Inpatient Hospital Stay: Payer: BLUE CROSS/BLUE SHIELD | Attending: Internal Medicine

## 2015-11-17 VITALS — BP 132/81 | HR 74 | Temp 97.4°F | Resp 18 | Wt 161.4 lb

## 2015-11-17 DIAGNOSIS — Z9221 Personal history of antineoplastic chemotherapy: Secondary | ICD-10-CM | POA: Diagnosis not present

## 2015-11-17 DIAGNOSIS — R7989 Other specified abnormal findings of blood chemistry: Secondary | ICD-10-CM | POA: Insufficient documentation

## 2015-11-17 DIAGNOSIS — D696 Thrombocytopenia, unspecified: Secondary | ICD-10-CM | POA: Insufficient documentation

## 2015-11-17 DIAGNOSIS — K209 Esophagitis, unspecified: Secondary | ICD-10-CM

## 2015-11-17 DIAGNOSIS — Z86718 Personal history of other venous thrombosis and embolism: Secondary | ICD-10-CM | POA: Insufficient documentation

## 2015-11-17 DIAGNOSIS — C8583 Other specified types of non-Hodgkin lymphoma, intra-abdominal lymph nodes: Secondary | ICD-10-CM

## 2015-11-17 DIAGNOSIS — R5383 Other fatigue: Secondary | ICD-10-CM

## 2015-11-17 DIAGNOSIS — Z79899 Other long term (current) drug therapy: Secondary | ICD-10-CM

## 2015-11-17 DIAGNOSIS — Z800 Family history of malignant neoplasm of digestive organs: Secondary | ICD-10-CM | POA: Diagnosis not present

## 2015-11-17 DIAGNOSIS — M818 Other osteoporosis without current pathological fracture: Secondary | ICD-10-CM | POA: Insufficient documentation

## 2015-11-17 DIAGNOSIS — K5790 Diverticulosis of intestine, part unspecified, without perforation or abscess without bleeding: Secondary | ICD-10-CM | POA: Insufficient documentation

## 2015-11-17 DIAGNOSIS — K219 Gastro-esophageal reflux disease without esophagitis: Secondary | ICD-10-CM | POA: Diagnosis not present

## 2015-11-17 DIAGNOSIS — C8333 Diffuse large B-cell lymphoma, intra-abdominal lymph nodes: Secondary | ICD-10-CM | POA: Insufficient documentation

## 2015-11-17 DIAGNOSIS — Z8719 Personal history of other diseases of the digestive system: Secondary | ICD-10-CM | POA: Diagnosis not present

## 2015-11-17 DIAGNOSIS — R079 Chest pain, unspecified: Secondary | ICD-10-CM

## 2015-11-17 DIAGNOSIS — R000 Tachycardia, unspecified: Secondary | ICD-10-CM | POA: Diagnosis not present

## 2015-11-17 DIAGNOSIS — R945 Abnormal results of liver function studies: Secondary | ICD-10-CM

## 2015-11-17 DIAGNOSIS — E669 Obesity, unspecified: Secondary | ICD-10-CM

## 2015-11-17 DIAGNOSIS — R61 Generalized hyperhidrosis: Secondary | ICD-10-CM | POA: Diagnosis not present

## 2015-11-17 LAB — CBC WITH DIFFERENTIAL/PLATELET
BASOS ABS: 0 10*3/uL (ref 0–0.1)
Basophils Relative: 1 %
EOS ABS: 0 10*3/uL (ref 0–0.7)
EOS PCT: 0 %
HCT: 31.7 % — ABNORMAL LOW (ref 35.0–47.0)
Hemoglobin: 11 g/dL — ABNORMAL LOW (ref 12.0–16.0)
LYMPHS ABS: 0.3 10*3/uL — AB (ref 1.0–3.6)
Lymphocytes Relative: 5 %
MCH: 31.4 pg (ref 26.0–34.0)
MCHC: 34.8 g/dL (ref 32.0–36.0)
MCV: 90.2 fL (ref 80.0–100.0)
Monocytes Absolute: 0.2 10*3/uL (ref 0.2–0.9)
Monocytes Relative: 3 %
Neutro Abs: 5.3 10*3/uL (ref 1.4–6.5)
Neutrophils Relative %: 91 %
PLATELETS: 232 10*3/uL (ref 150–440)
RBC: 3.51 MIL/uL — AB (ref 3.80–5.20)
RDW: 19.4 % — ABNORMAL HIGH (ref 11.5–14.5)
WBC: 5.8 10*3/uL (ref 3.6–11.0)

## 2015-11-17 LAB — LACTATE DEHYDROGENASE: LDH: 146 U/L (ref 98–192)

## 2015-11-17 LAB — COMPREHENSIVE METABOLIC PANEL
ALT: 16 U/L (ref 14–54)
AST: 21 U/L (ref 15–41)
Albumin: 3.9 g/dL (ref 3.5–5.0)
Alkaline Phosphatase: 70 U/L (ref 38–126)
Anion gap: 7 (ref 5–15)
BUN: 16 mg/dL (ref 6–20)
CHLORIDE: 109 mmol/L (ref 101–111)
CO2: 24 mmol/L (ref 22–32)
CREATININE: 0.72 mg/dL (ref 0.44–1.00)
Calcium: 8.9 mg/dL (ref 8.9–10.3)
GFR calc non Af Amer: >60 mL/min (ref >60)
Glucose, Bld: 102 mg/dL — ABNORMAL HIGH (ref 65–99)
POTASSIUM: 3.8 mmol/L (ref 3.5–5.1)
SODIUM: 140 mmol/L (ref 135–145)
Total Bilirubin: 0.4 mg/dL (ref 0.3–1.2)
Total Protein: 6 g/dL — ABNORMAL LOW (ref 6.5–8.1)

## 2015-11-17 MED ORDER — SODIUM CHLORIDE 0.9 % IV SOLN: 375.0000 mg/m2 | Freq: Once | INTRAVENOUS | Status: DC

## 2015-11-17 MED ORDER — DEXAMETHASONE SODIUM PHOSPHATE 100 MG/10ML IJ SOLN
10.0000 mg | Freq: Once | INTRAMUSCULAR | Status: AC
  Administered 2015-11-17: 10 mg via INTRAVENOUS
  Filled 2015-11-17: qty 1

## 2015-11-17 MED ORDER — HEPARIN SOD (PORK) LOCK FLUSH 100 UNIT/ML IV SOLN
500.0000 [IU] | Freq: Once | INTRAVENOUS | Status: AC
  Administered 2015-11-17: 500 [IU] via INTRAVENOUS
  Filled 2015-11-17: qty 5

## 2015-11-17 MED ORDER — SODIUM CHLORIDE 0.9 % IV SOLN
375.0000 mg/m2 | Freq: Once | INTRAVENOUS | Status: AC
  Administered 2015-11-17: 700 mg via INTRAVENOUS
  Filled 2015-11-17: qty 60

## 2015-11-17 MED ORDER — SODIUM CHLORIDE 0.9 % IV SOLN
Freq: Once | INTRAVENOUS | Status: AC
  Administered 2015-11-17: 10:00:00 via INTRAVENOUS
  Filled 2015-11-17: qty 1000

## 2015-11-17 MED ORDER — PEGFILGRASTIM 6 MG/0.6ML ~~LOC~~ PSKT
6.0000 mg | PREFILLED_SYRINGE | Freq: Once | SUBCUTANEOUS | Status: AC
  Administered 2015-11-17: 6 mg via SUBCUTANEOUS
  Filled 2015-11-17: qty 0.6

## 2015-11-17 MED ORDER — ACETAMINOPHEN 325 MG PO TABS
650.0000 mg | ORAL_TABLET | Freq: Once | ORAL | Status: AC
  Administered 2015-11-17: 650 mg via ORAL
  Filled 2015-11-17: qty 2

## 2015-11-17 MED ORDER — VINCRISTINE SULFATE CHEMO INJECTION 1 MG/ML
2.0000 mg | Freq: Once | INTRAVENOUS | Status: AC
  Administered 2015-11-17: 2 mg via INTRAVENOUS
  Filled 2015-11-17: qty 2

## 2015-11-17 MED ORDER — SODIUM CHLORIDE 0.9% FLUSH
10.0000 mL | Freq: Once | INTRAVENOUS | Status: AC
  Administered 2015-11-17: 10 mL via INTRAVENOUS
  Filled 2015-11-17: qty 10

## 2015-11-17 MED ORDER — PALONOSETRON HCL INJECTION 0.25 MG/5ML
0.2500 mg | Freq: Once | INTRAVENOUS | Status: AC
  Administered 2015-11-17: 0.25 mg via INTRAVENOUS
  Filled 2015-11-17: qty 5

## 2015-11-17 MED ORDER — DIPHENHYDRAMINE HCL 25 MG PO CAPS
50.0000 mg | ORAL_CAPSULE | Freq: Once | ORAL | Status: AC
  Administered 2015-11-17: 50 mg via ORAL
  Filled 2015-11-17: qty 2

## 2015-11-17 MED ORDER — DOXORUBICIN HCL CHEMO IV INJECTION 2 MG/ML
50.0000 mg/m2 | Freq: Once | INTRAVENOUS | Status: AC
  Administered 2015-11-17: 92 mg via INTRAVENOUS
  Filled 2015-11-17: qty 46

## 2015-11-17 MED ORDER — SODIUM CHLORIDE 0.9 % IV SOLN
1400.0000 mg | Freq: Once | INTRAVENOUS | Status: AC
  Administered 2015-11-17: 1400 mg via INTRAVENOUS
  Filled 2015-11-17: qty 50

## 2015-11-17 NOTE — Assessment & Plan Note (Addendum)
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 PET- significant response noted. Proceed with CHOP cycle #3 today.  #  Given the presence of leukemic cells in the peripheral blood- discussed the risk of leptomeningeal relapse. Plan in pt IV MXT on  Day # 14.   #  Left lower extremity DVT- improved on Eliquis.  # Elevated LFTs- secondary to valacyclovir/Diflucan back to baseline.  # Follow-up in 10 days CBC CMP.    I reviewed the images myself and with the patient and family in detail. A copy of this report was given.

## 2015-11-17 NOTE — Progress Notes (Signed)
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 CT- 1-2 CM LN subpectoral/medistinal/ retro-peritoneal/pelvic/ Right inguinal LN 1.6cm- Bx- DLBCL- ABC; myc-POS; FISH gene re-arragement-NEG. PET- MULTIPLE LN/ Bone involvement; May 25th- R-CHOP q3 W x2; PET- Excellent Response.   # Lumbar puncture- difficult-spinal headache.   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  Patient is feeling well. Denies any nausea or vomiting. Denies any headaches. Denies any sores anymore. the legs. No bleeding.  No nausea no vomiting. No chest pain or shortness of breath or cough. Denies any significant headaches. Appetite is good.   REVIEW OF SYSTEMS:  A complete 10 point  review of system is done which is negative except mentioned above/history of present illness.   PAST MEDICAL HISTORY :  Past Medical History  Diagnosis Date  . Tachycardia   . Overweight(278.02)     Obesity  . Chest pain, unspecified   . Palpitations   . Osteoporosis   . Thrombocytopenia ([place])   . Lymphoma ([place]) 09/14/15    Monoclonal B cell lymphoma  . Diverticulosis 07/06/15  . Esophagitis 07/06/15  . Gastritis 07/06/15  . 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 GERD (gastroesophageal reflux disease)   . Fatigue   . Leg cramps   . Night sweats   . Cancer ([place])   . Dysrhythmia   . Shortness of breath dyspnea   . Arthritis   . Blood dyscrasia     PAST SURGICAL HISTORY :   Past Surgical History  Procedure Laterality Date  . Abdominal hysterectomy  2005  . Bone marrow biopsy  09/14/15  . Colonoscopy  07/05/2014  . Esophagogastroduodenoscopy  07/05/2014  . Portacath placement Right   . Peripheral vascular catheterization N/A 09/27/2015    Procedure: Glori Luis Cath Insertion;  Surgeon: Algernon Huxley, MD;  Location: [facility];  Service: Cardiovascular;  Laterality: N/A;  . Cholecystectomy  1997  . Inguinal lymph node biopsy Right 10/05/2015    Procedure: INGUINAL LYMPH NODE BIOPSY;  Surgeon: Christene Lye, MD;  Location: [facility] ORS;  Service: General;  Laterality: Right;    FAMILY HISTORY :   Family History  Problem Relation Age of Onset  . Diabetes Neg Hx   . Heart disease Father     CABG x 4  . Hypertension Mother   .  Pancreatic cancer Mother   . Ulcers Mother   . Asthma Mother     SOCIAL HISTORY:   Social History  Substance Use Topics  . Smoking status: Never Smoker   . Smokeless tobacco: Never Used  . Alcohol Use: No    ALLERGIES:  has No Known Allergies.  MEDICATIONS:  Current Outpatient Prescriptions  Medication Sig Dispense Refill  . acyclovir (ZOVIRAX) 400 MG tablet Take 1 tablet (400 mg total) by mouth 2 (two) times daily. 60 tablet 3  . apixaban  (ELIQUIS) 5 MG TABS tablet Take 1 tablet (5 mg total) by mouth 2 (two) times daily. From 10/27/15 60 tablet 3  . cyclobenzaprine (FLEXERIL) 5 MG tablet Take 1 tablet (5 mg total) by mouth 3 (three) times daily as needed for muscle spasms. 30 tablet 0  . diazepam (VALIUM) 5 MG tablet Take 5 mg by mouth every 8 (eight) hours as needed (for vertigo).    Marland Kitchen lidocaine-prilocaine (EMLA) cream Apply 1 application topically as needed (prior to accessing port).    . ondansetron (ZOFRAN) 8 MG tablet Take 8 mg by mouth every 8 (eight) hours as needed for nausea or vomiting.    Marland Kitchen oxyCODONE-acetaminophen (PERCOCET) 7.5-325 MG tablet Take 1 tablet by mouth every 6 (six) hours as needed for severe pain. 60 tablet 0  . predniSONE (DELTASONE) 50 MG tablet Take 100 mg by mouth daily with breakfast. Pt takes for five days starting the day of chemo.    . prochlorperazine (COMPAZINE) 10 MG tablet Take 1 tablet (10 mg total) by mouth every 6 (six) hours as needed for nausea or vomiting. 40 tablet 1  . ranitidine (ZANTAC) 150 MG tablet Take 150 mg by mouth 2 (two) times daily.      No current facility-administered medications for this visit.   Facility-Administered Medications Ordered in Other Visits  Medication Dose Route Frequency Provider Last Rate Last Dose  . 0.9 %  sodium chloride infusion   Intravenous Continuous Evlyn Kanner, NP   Stopped at 10/14/15 1312    PHYSICAL EXAMINATION: ECOG PERFORMANCE STATUS: 1 - Symptomatic but completely ambulatory  BP 132/81 mmHg  Pulse 74  Temp(Src) 97.4 F (36.3 C) (Tympanic)  Resp 18  Wt 161 lb 6 oz (73.2 kg)  Filed Weights   11/17/15 0915  Weight: 161 lb 6 oz (73.2 kg)    GENERAL: Well-nourished well-developed; Alert, no distress and comfortable.   Accompanied by her husband.  EYES: no pallor or icterus OROPHARYNX: no thrush; superficial 1 cm also noted in the hard palate. good dentition  NECK: supple, no masses felt LYMPH:  no palpable lymphadenopathy in  the cervical, axillary or inguinal regions LUNGS: clear to auscultation and  No wheeze or crackles HEART/CVS: regular rate & rhythm and no murmurs; 1+ lower extremity edema ABDOMEN:abdomen soft, non-tender and normal bowel sounds Musculoskeletal:no cyanosis of digits and no clubbing  PSYCH: alert & oriented x 3 with fluent speech NEURO: no focal motor/sensory deficits SKIN:  no rashes or significant lesions;  LABORATORY DATA:  I have reviewed the data as listed    Component Value Date/Time   NA 140 11/17/2015 0852   NA 139 06/29/2010 0749   K 3.8 11/17/2015 0852   CL 109 11/17/2015 0852   CO2 24 11/17/2015 0852   GLUCOSE 102* 11/17/2015 0852   BUN 16 11/17/2015 0852   BUN 16 06/29/2010 0749   CREATININE 0.72 11/17/2015 0852   CREATININE 0.9 06/29/2010 0749   CALCIUM 8.9 11/17/2015  0852   PROT 6.0* 11/17/2015 0852   ALBUMIN 3.9 11/17/2015 0852   AST 21 11/17/2015 0852   ALT 16 11/17/2015 0852   ALKPHOS 70 11/17/2015 0852   BILITOT 0.4 11/17/2015 0852   GFRNONAA >60 11/17/2015 0852   GFRAA >60 11/17/2015 0852    No results found for: SPEP, UPEP  Lab Results  Component Value Date   WBC 5.8 11/17/2015   NEUTROABS 5.3 11/17/2015   HGB 11.0* 11/17/2015   HCT 31.7* 11/17/2015   MCV 90.2 11/17/2015   PLT 232 11/17/2015      Chemistry      Component Value Date/Time   NA 140 11/17/2015 0852   NA 139 06/29/2010 0749   K 3.8 11/17/2015 0852   CL 109 11/17/2015 0852   CO2 24 11/17/2015 0852   BUN 16 11/17/2015 0852   BUN 16 06/29/2010 0749   CREATININE 0.72 11/17/2015 0852   CREATININE 0.9 06/29/2010 0749   GLU 93 06/29/2010 0749      Component Value Date/Time   CALCIUM 8.9 11/17/2015 0852   ALKPHOS 70 11/17/2015 0852   AST 21 11/17/2015 0852   ALT 16 11/17/2015 0852   BILITOT 0.4 11/17/2015 0852       RADIOGRAPHIC STUDIES: I have personally reviewed the radiological images as listed and agreed with the findings in the report. No results found.   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 PET- significant response noted. Proceed with CHOP cycle #3 today.  #  Given the presence of leukemic cells in the peripheral blood- discussed the risk of leptomeningeal relapse. Plan in pt IV MXT on  Day # 14.   #  Left lower extremity DVT- improved on Eliquis.  # Elevated LFTs- secondary to valacyclovir/Diflucan back to baseline.  # Follow-up in 10 days CBC CMP.    I reviewed the images myself and with the patient and family in detail. A copy of this report was given.     Orders Placed This Encounter  Procedures  . CBC with Differential    Standing Status: Future     Number of Occurrences:      Standing Expiration Date: 11/16/2016  . Basic metabolic panel    Standing Status: Future     Number of Occurrences:      Standing Expiration Date: 11/16/2016    Order Specific Question:  Has the patient fasted?    Answer:  No   All questions were answered. The patient knows to call the clinic with any problems, questions or concerns. Patient will follow-up with me in approximately 10 days.      Cammie Sickle, MD 11/17/2015 7:42 PM

## 2015-11-17 NOTE — Progress Notes (Signed)
Patient here for pet scan results and next RCHOP tx. She has no medical complaints today.

## 2015-11-27 ENCOUNTER — Other Ambulatory Visit: Payer: Self-pay | Admitting: Internal Medicine

## 2015-11-28 ENCOUNTER — Inpatient Hospital Stay (HOSPITAL_BASED_OUTPATIENT_CLINIC_OR_DEPARTMENT_OTHER): Payer: BLUE CROSS/BLUE SHIELD | Admitting: Internal Medicine

## 2015-11-28 ENCOUNTER — Inpatient Hospital Stay: Payer: BLUE CROSS/BLUE SHIELD

## 2015-11-28 VITALS — BP 129/79 | HR 76 | Temp 98.1°F | Resp 18 | Wt 160.4 lb

## 2015-11-28 DIAGNOSIS — Z8719 Personal history of other diseases of the digestive system: Secondary | ICD-10-CM

## 2015-11-28 DIAGNOSIS — R000 Tachycardia, unspecified: Secondary | ICD-10-CM

## 2015-11-28 DIAGNOSIS — C8333 Diffuse large B-cell lymphoma, intra-abdominal lymph nodes: Secondary | ICD-10-CM | POA: Diagnosis not present

## 2015-11-28 DIAGNOSIS — R61 Generalized hyperhidrosis: Secondary | ICD-10-CM

## 2015-11-28 DIAGNOSIS — E669 Obesity, unspecified: Secondary | ICD-10-CM

## 2015-11-28 DIAGNOSIS — C8583 Other specified types of non-Hodgkin lymphoma, intra-abdominal lymph nodes: Secondary | ICD-10-CM

## 2015-11-28 DIAGNOSIS — R079 Chest pain, unspecified: Secondary | ICD-10-CM

## 2015-11-28 DIAGNOSIS — K219 Gastro-esophageal reflux disease without esophagitis: Secondary | ICD-10-CM

## 2015-11-28 DIAGNOSIS — K209 Esophagitis, unspecified: Secondary | ICD-10-CM

## 2015-11-28 DIAGNOSIS — K5790 Diverticulosis of intestine, part unspecified, without perforation or abscess without bleeding: Secondary | ICD-10-CM

## 2015-11-28 DIAGNOSIS — Z800 Family history of malignant neoplasm of digestive organs: Secondary | ICD-10-CM

## 2015-11-28 DIAGNOSIS — R7989 Other specified abnormal findings of blood chemistry: Secondary | ICD-10-CM | POA: Diagnosis not present

## 2015-11-28 DIAGNOSIS — Z79899 Other long term (current) drug therapy: Secondary | ICD-10-CM

## 2015-11-28 DIAGNOSIS — Z9221 Personal history of antineoplastic chemotherapy: Secondary | ICD-10-CM

## 2015-11-28 DIAGNOSIS — M818 Other osteoporosis without current pathological fracture: Secondary | ICD-10-CM

## 2015-11-28 DIAGNOSIS — Z86718 Personal history of other venous thrombosis and embolism: Secondary | ICD-10-CM

## 2015-11-28 DIAGNOSIS — D696 Thrombocytopenia, unspecified: Secondary | ICD-10-CM

## 2015-11-28 DIAGNOSIS — R5383 Other fatigue: Secondary | ICD-10-CM

## 2015-11-28 LAB — CBC WITH DIFFERENTIAL/PLATELET
BASOS ABS: 0 10*3/uL (ref 0–0.1)
BASOS PCT: 1 %
EOS ABS: 0 10*3/uL (ref 0–0.7)
Eosinophils Relative: 0 %
HEMATOCRIT: 32 % — AB (ref 35.0–47.0)
HEMOGLOBIN: 11 g/dL — AB (ref 12.0–16.0)
Lymphocytes Relative: 10 %
Lymphs Abs: 0.8 10*3/uL — ABNORMAL LOW (ref 1.0–3.6)
MCH: 31.6 pg (ref 26.0–34.0)
MCHC: 34.4 g/dL (ref 32.0–36.0)
MCV: 91.6 fL (ref 80.0–100.0)
MONO ABS: 0.8 10*3/uL (ref 0.2–0.9)
Monocytes Relative: 11 %
NEUTROS ABS: 6.3 10*3/uL (ref 1.4–6.5)
NEUTROS PCT: 78 %
Platelets: 149 10*3/uL — ABNORMAL LOW (ref 150–440)
RBC: 3.49 MIL/uL — ABNORMAL LOW (ref 3.80–5.20)
RDW: 18.9 % — AB (ref 11.5–14.5)
WBC: 8 10*3/uL (ref 3.6–11.0)

## 2015-11-28 LAB — BASIC METABOLIC PANEL
Anion gap: 4 — ABNORMAL LOW (ref 5–15)
BUN: 12 mg/dL (ref 6–20)
CALCIUM: 8.9 mg/dL (ref 8.9–10.3)
CHLORIDE: 105 mmol/L (ref 101–111)
CO2: 29 mmol/L (ref 22–32)
CREATININE: 0.65 mg/dL (ref 0.44–1.00)
GFR calc non Af Amer: >60 mL/min (ref >60)
GLUCOSE: 95 mg/dL (ref 65–99)
Potassium: 4 mmol/L (ref 3.5–5.1)
Sodium: 138 mmol/L (ref 135–145)

## 2015-11-28 NOTE — Assessment & Plan Note (Addendum)
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 PET- significant response noted. S/p 3 cycles.  #  Given the presence of leukemic cells in the peripheral blood- discussed the risk of leptomeningeal relapse. Plan HD -MXT on 07/24 in hospital. I explained the rationale to the patient and husband and the logistics involved. Also spoke to pharmacy.  #  Left lower extremity DVT- improved on Eliquis.  # Follow-up on the 24th/ for inpatient admission.   # 25 minutes face-to-face with the patient discussing the above plan of care; more than 50% of time spent on prognosis/ natural history; counseling and coordination.

## 2015-11-28 NOTE — Progress Notes (Signed)
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 CT- 1-2 CM LN subpectoral/medistinal/ retro-peritoneal/pelvic/ Right inguinal LN 1.6cm- Bx- DLBCL- ABC; myc-POS; FISH gene re-arragement-NEG. PET- MULTIPLE LN/ Bone involvement; May 25th- R-CHOP q3 W x2; PET- Excellent Response.   # Lumbar puncture- difficult-spinal headache.   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  Patient is feeling well- No chest pain or shortness of breath or cough.. Denies any nausea or vomiting. Denies any headaches. Denies any sores anymore. the legs. No bleeding. No nausea no vomiting.  Denies any significant headaches. Appetite is good. Denies any tingling and numbness.  REVIEW OF SYSTEMS:  A complete 10 point review of system is done  which is negative except mentioned above/history of present illness.   PAST MEDICAL HISTORY :  Past Medical History  Diagnosis Date  . Tachycardia   . Overweight(278.02)     Obesity  . Chest pain, unspecified   . Palpitations   . Osteoporosis   . Thrombocytopenia ([place])   . 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 Diverticulosis 07/06/15  . Esophagitis 07/06/15  . Gastritis 07/06/15  . 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 GERD (gastroesophageal reflux disease)   . Fatigue   . Leg cramps   . Night sweats   . Cancer ([place])   . Dysrhythmia   . Shortness of breath dyspnea   . Arthritis   . Blood dyscrasia     PAST SURGICAL HISTORY :   Past Surgical History  Procedure Laterality Date  . Abdominal hysterectomy  2005  . Bone marrow biopsy  09/14/15  . Colonoscopy  07/05/2014  . Esophagogastroduodenoscopy  07/05/2014  . Portacath placement Right   . Peripheral vascular catheterization N/A 09/27/2015    Procedure: Glori Luis Cath Insertion;  Surgeon: Algernon Huxley, MD;  Location: [place] [facility] LAB;  Service: Cardiovascular;  Laterality: N/A;  . Cholecystectomy  1997  . Inguinal lymph node biopsy Right 10/05/2015    Procedure: INGUINAL LYMPH NODE BIOPSY;  Surgeon: Christene Lye, MD;  Location: [facility] ORS;  Service: General;  Laterality: Right;    FAMILY HISTORY :   Family History  Problem Relation Age of Onset  . Diabetes Neg Hx   . Heart disease Father     CABG x 4  . Hypertension Mother   . Pancreatic cancer Mother   .  Ulcers Mother   . Asthma Mother     SOCIAL HISTORY:   Social History  Substance Use Topics  . Smoking status: Never Smoker   . Smokeless tobacco: Never Used  . Alcohol Use: No    ALLERGIES:  has No Known Allergies.  MEDICATIONS:  Current Outpatient Prescriptions  Medication Sig Dispense Refill  . acyclovir (ZOVIRAX) 400 MG tablet Take 1 tablet (400 mg total) by mouth 2 (two) times daily. 60 tablet 3  . apixaban (ELIQUIS) 5 MG TABS tablet  Take 1 tablet (5 mg total) by mouth 2 (two) times daily. From 10/27/15 60 tablet 3  . cyclobenzaprine (FLEXERIL) 5 MG tablet Take 1 tablet (5 mg total) by mouth 3 (three) times daily as needed for muscle spasms. 30 tablet 0  . diazepam (VALIUM) 5 MG tablet Take 5 mg by mouth every 8 (eight) hours as needed (for vertigo).    Marland Kitchen lidocaine-prilocaine (EMLA) cream Apply 1 application topically as needed (prior to accessing port).    . ondansetron (ZOFRAN) 8 MG tablet Take 8 mg by mouth every 8 (eight) hours as needed for nausea or vomiting.    Marland Kitchen oxyCODONE-acetaminophen (PERCOCET) 7.5-325 MG tablet Take 1 tablet by mouth every 6 (six) hours as needed for severe pain. 60 tablet 0  . predniSONE (DELTASONE) 50 MG tablet Take 100 mg by mouth daily with breakfast. Pt takes for five days starting the day of chemo.    . prochlorperazine (COMPAZINE) 10 MG tablet Take 1 tablet (10 mg total) by mouth every 6 (six) hours as needed for nausea or vomiting. 40 tablet 1  . ranitidine (ZANTAC) 150 MG tablet Take 150 mg by mouth 2 (two) times daily.      No current facility-administered medications for this visit.   Facility-Administered Medications Ordered in Other Visits  Medication Dose Route Frequency Provider Last Rate Last Dose  . 0.9 %  sodium chloride infusion   Intravenous Continuous Evlyn Kanner, NP   Stopped at 10/14/15 1312    PHYSICAL EXAMINATION: ECOG PERFORMANCE STATUS: 1 - Symptomatic but completely ambulatory  BP 129/79 mmHg  Pulse 76  Temp(Src) 98.1 F (36.7 C) (Tympanic)  Resp 18  Wt 160 lb 6 oz (72.746 kg)  Filed Weights   11/28/15 1056  Weight: 160 lb 6 oz (72.746 kg)    GENERAL: Well-nourished well-developed; Alert, no distress and comfortable.   Accompanied by her husband.  EYES: no pallor or icterus OROPHARYNX: no thrush; good dentition  NECK: supple, no masses felt LYMPH:  no palpable lymphadenopathy in the cervical, axillary or inguinal regions LUNGS: clear to auscultation  and  No wheeze or crackles HEART/CVS: regular rate & rhythm and no murmurs; No lower extremity edema. ABDOMEN:abdomen soft, non-tender and normal bowel sounds Musculoskeletal:no cyanosis of digits and no clubbing  PSYCH: alert & oriented x 3 with fluent speech NEURO: no focal motor/sensory deficits SKIN:  no rashes or significant lesions;  LABORATORY DATA:  I have reviewed the data as listed    Component Value Date/Time   NA 138 11/28/2015 1030   NA 139 06/29/2010 0749   K 4.0 11/28/2015 1030   CL 105 11/28/2015 1030   CO2 29 11/28/2015 1030   GLUCOSE 95 11/28/2015 1030   BUN 12 11/28/2015 1030   BUN 16 06/29/2010 0749   CREATININE 0.65 11/28/2015 1030   CREATININE 0.9 06/29/2010 0749   CALCIUM 8.9 11/28/2015 1030   PROT 6.0* 11/17/2015 0852   ALBUMIN 3.9 11/17/2015 0852  AST 21 11/17/2015 0852   ALT 16 11/17/2015 0852   ALKPHOS 70 11/17/2015 0852   BILITOT 0.4 11/17/2015 0852   GFRNONAA >60 11/28/2015 1030   GFRAA >60 11/28/2015 1030    No results found for: SPEP, UPEP  Lab Results  Component Value Date   WBC 8.0 11/28/2015   NEUTROABS 6.3 11/28/2015   HGB 11.0* 11/28/2015   HCT 32.0* 11/28/2015   MCV 91.6 11/28/2015   PLT 149* 11/28/2015      Chemistry      Component Value Date/Time   NA 138 11/28/2015 1030   NA 139 06/29/2010 0749   K 4.0 11/28/2015 1030   CL 105 11/28/2015 1030   CO2 29 11/28/2015 1030   BUN 12 11/28/2015 1030   BUN 16 06/29/2010 0749   CREATININE 0.65 11/28/2015 1030   CREATININE 0.9 06/29/2010 0749   GLU 93 06/29/2010 0749      Component Value Date/Time   CALCIUM 8.9 11/28/2015 1030   ALKPHOS 70 11/17/2015 0852   AST 21 11/17/2015 0852   ALT 16 11/17/2015 0852   BILITOT 0.4 11/17/2015 0852       RADIOGRAPHIC STUDIES: I have personally reviewed the radiological images as listed and agreed with the findings in the report. No results found.   ASSESSMENT & PLAN:  No problem-specific assessment & plan notes found for this  encounter.   No orders of the defined types were placed in this encounter.   All questions were answered. The patient knows to call the clinic with any problems, questions or concerns. Patient will follow-up with me in approximately 10 days.      Cammie Sickle, MD 11/28/2015 11:21 AM

## 2015-11-28 NOTE — Patient Instructions (Signed)
Methotrexate injection What is this medicine? METHOTREXATE (METH oh TREX ate) is a chemotherapy drug used to treat cancer including breast cancer, leukemia, and lymphoma. This medicine can also be used to treat psoriasis and certain kinds of arthritis. This medicine may be used for other purposes; ask your health care provider or pharmacist if you have questions. What should I tell my health care provider before I take this medicine? They need to know if you have any of these conditions: -fluid in the stomach area or lungs -if you often drink alcohol -infection or immune system problems -kidney disease -liver disease -low blood counts, like low white cell, platelet, or red cell counts -lung disease -radiation therapy -stomach ulcers -ulcerative colitis -an unusual or allergic reaction to methotrexate, other medicines, foods, dyes, or preservatives -pregnant or trying to get pregnant -breast-feeding How should I use this medicine? This medicine is for infusion into a vein or for injection into muscle or into the spinal fluid (whichever applies). It is usually given by a health care professional in a hospital or clinic setting. In rare cases, you might get this medicine at home. You will be taught how to give this medicine. Use exactly as directed. Take your medicine at regular intervals. Do not take your medicine more often than directed. If this medicine is used for arthritis or psoriasis, it should be taken weekly, NOT daily. It is important that you put your used needles and syringes in a special sharps container. Do not put them in a trash can. If you do not have a sharps container, call your pharmacist or healthcare provider to get one. Talk to your pediatrician regarding the use of this medicine in children. While this drug may be prescribed for children as young as 2 years for selected conditions, precautions do apply. Overdosage: If you think you have taken too much of this medicine  contact a poison control center or emergency room at once. NOTE: This medicine is only for you. Do not share this medicine with others. What if I miss a dose? It is important not to miss your dose. Call your doctor or health care professional if you are unable to keep an appointment. If you give yourself the medicine and you miss a dose, talk with your doctor or health care professional. Do not take double or extra doses. What may interact with this medicine? This medicine may interact with the following medications: -acitretin -aspirin or aspirin-like medicines including salicylates -azathioprine -certain antibiotics like chloramphenicol, penicillin, tetracycline -certain medicines for stomach problems like esomeprazole, omeprazole, pantoprazole -cyclosporine -gold -hydroxychloroquine -live virus vaccines -mercaptopurine -NSAIDs, medicines for pain and inflammation, like ibuprofen or naproxen -other cytotoxic agents -penicillamine -phenylbutazone -phenytoin -probenacid -retinoids such as isotretinoin and tretinoin -steroid medicines like prednisone or cortisone -sulfonamides like sulfasalazine and trimethoprim/sulfamethoxazole -theophylline This list may not describe all possible interactions. Give your health care provider a list of all the medicines, herbs, non-prescription drugs, or dietary supplements you use. Also tell them if you smoke, drink alcohol, or use illegal drugs. Some items may interact with your medicine. What should I watch for while using this medicine? Avoid alcoholic drinks. In some cases, you may be given additional medicines to help with side effects. Follow all directions for their use. This medicine can make you more sensitive to the sun. Keep out of the sun. If you cannot avoid being in the sun, wear protective clothing and use sunscreen. Do not use sun lamps or tanning beds/booths. You may get drowsy   or dizzy. Do not drive, use machinery, or do anything that  needs mental alertness until you know how this medicine affects you. Do not stand or sit up quickly, especially if you are an older patient. This reduces the risk of dizzy or fainting spells. You may need blood work done while you are taking this medicine. Call your doctor or health care professional for advice if you get a fever, chills or sore throat, or other symptoms of a cold or flu. Do not treat yourself. This drug decreases your body's ability to fight infections. Try to avoid being around people who are sick. This medicine may increase your risk to bruise or bleed. Call your doctor or health care professional if you notice any unusual bleeding. Check with your doctor or health care professional if you get an attack of severe diarrhea, nausea and vomiting, or if you sweat a lot. The loss of too much body fluid can make it dangerous for you to take this medicine. Talk to your doctor about your risk of cancer. You may be more at risk for certain types of cancers if you take this medicine. Both men and women must use effective birth control with this medicine. Do not become pregnant while taking this medicine or until at least 1 normal menstrual cycle has occurred after stopping it. Women should inform their doctor if they wish to become pregnant or think they might be pregnant. Men should not father a child while taking this medicine and for 3 months after stopping it. There is a potential for serious side effects to an unborn child. Talk to your health care professional or pharmacist for more information. Do not breast-feed an infant while taking this medicine. What side effects may I notice from receiving this medicine? Side effects that you should report to your doctor or health care professional as soon as possible: -allergic reactions like skin rash, itching or hives, swelling of the face, lips, or tongue -back pain -breathing problems or shortness of breath -confusion -diarrhea -dry,  nonproductive cough -low blood counts - this medicine may decrease the number of [place] blood cells, red blood cells and platelets. You may be at increased risk of infections and bleeding -mouth sores -redness, blistering, peeling or loosening of the skin, including inside the mouth -seizures -severe headaches -signs of infection - fever or chills, cough, sore throat, pain or difficulty passing urine -signs and symptoms of bleeding such as bloody or black, tarry stools; red or dark-brown urine; spitting up blood or brown material that looks like coffee grounds; red spots on the skin; unusual bruising or bleeding from the eye, gums, or nose -signs and symptoms of kidney injury like trouble passing urine or change in the amount of urine -signs and symptoms of liver injury like dark yellow or brown urine; general ill feeling or flu-like symptoms; light-colored stools; loss of appetite; nausea; right upper belly pain; unusually weak or tired; yellowing of the eyes or skin -stiff neck -vomiting Side effects that usually do not require medical attention (report to your doctor or health care professional if they continue or are bothersome): -dizziness -hair loss -headache -stomach pain -upset stomach This list may not describe all possible side effects. Call your doctor for medical advice about side effects. You may report side effects to FDA at 1-800-FDA-1088. Where should I keep my medicine? If you are using this medicine at home, you will be instructed on how to store this medicine. Throw away any unused medicine after   the expiration date on the label. NOTE: This sheet is a summary. It may not cover all possible information. If you have questions about this medicine, talk to your doctor, pharmacist, or health care provider.    2016, Elsevier/Gold Standard. (2014-08-19 12:36:41)

## 2015-12-02 ENCOUNTER — Ambulatory Visit: Payer: BLUE CROSS/BLUE SHIELD | Admitting: Internal Medicine

## 2015-12-02 ENCOUNTER — Other Ambulatory Visit: Payer: BLUE CROSS/BLUE SHIELD

## 2015-12-05 ENCOUNTER — Other Ambulatory Visit: Payer: Self-pay

## 2015-12-05 ENCOUNTER — Inpatient Hospital Stay: Payer: BLUE CROSS/BLUE SHIELD | Admitting: Internal Medicine

## 2015-12-05 ENCOUNTER — Inpatient Hospital Stay: Payer: BLUE CROSS/BLUE SHIELD

## 2015-12-05 ENCOUNTER — Inpatient Hospital Stay
Admission: AD | Admit: 2015-12-05 | Discharge: 2015-12-10 | DRG: 841 | Disposition: A | Discharge status: Home / Self Care | Payer: BLUE CROSS/BLUE SHIELD | Source: Ambulatory Visit | Attending: Internal Medicine | Admitting: Internal Medicine

## 2015-12-05 VITALS — BP 118/77 | HR 76 | Temp 98.5°F | Resp 18 | Wt 163.0 lb

## 2015-12-05 DIAGNOSIS — Z8719 Personal history of other diseases of the digestive system: Secondary | ICD-10-CM | POA: Diagnosis not present

## 2015-12-05 DIAGNOSIS — Z800 Family history of malignant neoplasm of digestive organs: Secondary | ICD-10-CM

## 2015-12-05 DIAGNOSIS — Z90710 Acquired absence of both cervix and uterus: Secondary | ICD-10-CM | POA: Diagnosis not present

## 2015-12-05 DIAGNOSIS — M818 Other osteoporosis without current pathological fracture: Secondary | ICD-10-CM | POA: Diagnosis not present

## 2015-12-05 DIAGNOSIS — Z79899 Other long term (current) drug therapy: Secondary | ICD-10-CM

## 2015-12-05 DIAGNOSIS — Z825 Family history of asthma and other chronic lower respiratory diseases: Secondary | ICD-10-CM

## 2015-12-05 DIAGNOSIS — T451X5A Adverse effect of antineoplastic and immunosuppressive drugs, initial encounter: Secondary | ICD-10-CM | POA: Diagnosis present

## 2015-12-05 DIAGNOSIS — R7989 Other specified abnormal findings of blood chemistry: Secondary | ICD-10-CM | POA: Diagnosis not present

## 2015-12-05 DIAGNOSIS — Z9049 Acquired absence of other specified parts of digestive tract: Secondary | ICD-10-CM | POA: Diagnosis not present

## 2015-12-05 DIAGNOSIS — Z6829 Body mass index (BMI) 29.0-29.9, adult: Secondary | ICD-10-CM

## 2015-12-05 DIAGNOSIS — R000 Tachycardia, unspecified: Secondary | ICD-10-CM | POA: Diagnosis not present

## 2015-12-05 DIAGNOSIS — C8333 Diffuse large B-cell lymphoma, intra-abdominal lymph nodes: Principal | ICD-10-CM

## 2015-12-05 DIAGNOSIS — M1990 Unspecified osteoarthritis, unspecified site: Secondary | ICD-10-CM | POA: Diagnosis present

## 2015-12-05 DIAGNOSIS — Z9221 Personal history of antineoplastic chemotherapy: Secondary | ICD-10-CM | POA: Diagnosis not present

## 2015-12-05 DIAGNOSIS — C8583 Other specified types of non-Hodgkin lymphoma, intra-abdominal lymph nodes: Secondary | ICD-10-CM

## 2015-12-05 DIAGNOSIS — M810 Age-related osteoporosis without current pathological fracture: Secondary | ICD-10-CM | POA: Diagnosis present

## 2015-12-05 DIAGNOSIS — Z9889 Other specified postprocedural states: Secondary | ICD-10-CM

## 2015-12-05 DIAGNOSIS — E876 Hypokalemia: Secondary | ICD-10-CM | POA: Diagnosis present

## 2015-12-05 DIAGNOSIS — R079 Chest pain, unspecified: Secondary | ICD-10-CM | POA: Diagnosis not present

## 2015-12-05 DIAGNOSIS — R5383 Other fatigue: Secondary | ICD-10-CM | POA: Diagnosis not present

## 2015-12-05 DIAGNOSIS — K219 Gastro-esophageal reflux disease without esophagitis: Secondary | ICD-10-CM | POA: Diagnosis present

## 2015-12-05 DIAGNOSIS — Z7901 Long term (current) use of anticoagulants: Secondary | ICD-10-CM

## 2015-12-05 DIAGNOSIS — Z86718 Personal history of other venous thrombosis and embolism: Secondary | ICD-10-CM | POA: Diagnosis not present

## 2015-12-05 DIAGNOSIS — Z8249 Family history of ischemic heart disease and other diseases of the circulatory system: Secondary | ICD-10-CM | POA: Diagnosis not present

## 2015-12-05 DIAGNOSIS — D696 Thrombocytopenia, unspecified: Secondary | ICD-10-CM | POA: Diagnosis not present

## 2015-12-05 DIAGNOSIS — D61818 Other pancytopenia: Secondary | ICD-10-CM | POA: Diagnosis present

## 2015-12-05 DIAGNOSIS — E669 Obesity, unspecified: Secondary | ICD-10-CM | POA: Diagnosis present

## 2015-12-05 DIAGNOSIS — R61 Generalized hyperhidrosis: Secondary | ICD-10-CM | POA: Diagnosis not present

## 2015-12-05 DIAGNOSIS — K5790 Diverticulosis of intestine, part unspecified, without perforation or abscess without bleeding: Secondary | ICD-10-CM | POA: Diagnosis not present

## 2015-12-05 DIAGNOSIS — K209 Esophagitis, unspecified: Secondary | ICD-10-CM | POA: Diagnosis not present

## 2015-12-05 DIAGNOSIS — C8521 Mediastinal (thymic) large B-cell lymphoma, lymph nodes of head, face, and neck: Secondary | ICD-10-CM | POA: Diagnosis not present

## 2015-12-05 LAB — URINALYSIS COMPLETE WITH MICROSCOPIC (ARMC ONLY)
BILIRUBIN URINE: NEGATIVE
Bacteria, UA: NONE SEEN
GLUCOSE, UA: NEGATIVE mg/dL
Hgb urine dipstick: NEGATIVE
KETONES UR: NEGATIVE mg/dL
Leukocytes, UA: NEGATIVE
Nitrite: NEGATIVE
PH: 6 (ref 5.0–8.0)
Protein, ur: NEGATIVE mg/dL
RBC / HPF: NONE SEEN RBC/hpf (ref 0–5)
Specific Gravity, Urine: 1.003 — ABNORMAL LOW (ref 1.005–1.030)
WBC, UA: NONE SEEN WBC/hpf (ref 0–5)

## 2015-12-05 LAB — COMPREHENSIVE METABOLIC PANEL
ALBUMIN: 4.1 g/dL (ref 3.5–5.0)
ALK PHOS: 59 U/L (ref 38–126)
ALT: 14 U/L (ref 14–54)
AST: 18 U/L (ref 15–41)
Anion gap: 6 (ref 5–15)
BILIRUBIN TOTAL: 0.5 mg/dL (ref 0.3–1.2)
BUN: 17 mg/dL (ref 6–20)
CALCIUM: 9.1 mg/dL (ref 8.9–10.3)
CO2: 25 mmol/L (ref 22–32)
Chloride: 107 mmol/L (ref 101–111)
Creatinine, Ser: 0.61 mg/dL (ref 0.44–1.00)
GFR calc Af Amer: >60 mL/min (ref >60)
GLUCOSE: 121 mg/dL — AB (ref 65–99)
POTASSIUM: 3.6 mmol/L (ref 3.5–5.1)
Sodium: 138 mmol/L (ref 135–145)
TOTAL PROTEIN: 6.3 g/dL — AB (ref 6.5–8.1)

## 2015-12-05 LAB — CBC WITH DIFFERENTIAL/PLATELET
BASOS ABS: 0 10*3/uL (ref 0–0.1)
Basophils Relative: 1 %
Eosinophils Absolute: 0 10*3/uL (ref 0–0.7)
Eosinophils Relative: 0 %
HEMATOCRIT: 34.9 % — AB (ref 35.0–47.0)
HEMOGLOBIN: 11.9 g/dL — AB (ref 12.0–16.0)
LYMPHS PCT: 9 %
Lymphs Abs: 0.5 10*3/uL — ABNORMAL LOW (ref 1.0–3.6)
MCH: 31.5 pg (ref 26.0–34.0)
MCHC: 34.2 g/dL (ref 32.0–36.0)
MCV: 92.2 fL (ref 80.0–100.0)
MONO ABS: 0.6 10*3/uL (ref 0.2–0.9)
MONOS PCT: 11 %
NEUTROS ABS: 4.6 10*3/uL (ref 1.4–6.5)
NEUTROS PCT: 79 %
Platelets: 228 10*3/uL (ref 150–440)
RBC: 3.78 MIL/uL — ABNORMAL LOW (ref 3.80–5.20)
RDW: 18.7 % — AB (ref 11.5–14.5)
WBC: 5.8 10*3/uL (ref 3.6–11.0)

## 2015-12-05 LAB — URINE PH
PH: 7 (ref 5.0–8.0)
PH: 6 (ref 5.0–8.0)
pH: 6 (ref 5.0–8.0)

## 2015-12-05 MED ORDER — FAMOTIDINE 20 MG PO TABS
20.0000 mg | ORAL_TABLET | Freq: Two times a day (BID) | ORAL | Status: DC
  Administered 2015-12-05 – 2015-12-06 (×3): 20 mg via ORAL
  Filled 2015-12-05 (×3): qty 1

## 2015-12-05 MED ORDER — LIDOCAINE-PRILOCAINE 2.5-2.5 % EX CREA
1.0000 "application " | TOPICAL_CREAM | CUTANEOUS | Status: DC | PRN
  Filled 2015-12-05: qty 5

## 2015-12-05 MED ORDER — ACYCLOVIR 200 MG PO CAPS
400.0000 mg | ORAL_CAPSULE | Freq: Two times a day (BID) | ORAL | Status: DC
  Administered 2015-12-05 – 2015-12-10 (×10): 400 mg via ORAL
  Filled 2015-12-05 (×10): qty 2

## 2015-12-05 MED ORDER — ACETAMINOPHEN 325 MG PO TABS: 650.0000 mg | ORAL_TABLET | ORAL | Status: DC | PRN

## 2015-12-05 MED ORDER — SODIUM CHLORIDE 0.9 % IV SOLN: 3.5000 g/m2 | Freq: Once | INTRAVENOUS | Status: DC

## 2015-12-05 MED ORDER — OXYCODONE-ACETAMINOPHEN 7.5-325 MG PO TABS: 1.0000 | ORAL_TABLET | Freq: Four times a day (QID) | ORAL | Status: DC | PRN

## 2015-12-05 MED ORDER — STERILE WATER FOR INJECTION IV SOLN
INTRAVENOUS | Status: DC
  Administered 2015-12-05 – 2015-12-10 (×14): via INTRAVENOUS
  Filled 2015-12-05 (×24): qty 9.71

## 2015-12-05 MED ORDER — APIXABAN 5 MG PO TABS
5.0000 mg | ORAL_TABLET | Freq: Two times a day (BID) | ORAL | Status: DC
  Administered 2015-12-05 – 2015-12-10 (×10): 5 mg via ORAL
  Filled 2015-12-05 (×10): qty 1

## 2015-12-05 MED ORDER — ONDANSETRON HCL 4 MG/2ML IJ SOLN
4.0000 mg | Freq: Three times a day (TID) | INTRAMUSCULAR | Status: DC | PRN
  Administered 2015-12-07: 09:00:00 4 mg via INTRAVENOUS
  Filled 2015-12-05: qty 2

## 2015-12-05 MED ORDER — PROCHLORPERAZINE MALEATE 10 MG PO TABS: 10.0000 mg | ORAL_TABLET | Freq: Four times a day (QID) | ORAL | Status: DC | PRN

## 2015-12-05 MED ORDER — GUAIFENESIN-DM 100-10 MG/5ML PO SYRP: 10.0000 mL | ORAL_SOLUTION | ORAL | Status: DC | PRN

## 2015-12-05 MED ORDER — CYCLOBENZAPRINE HCL 10 MG PO TABS: 5.0000 mg | ORAL_TABLET | Freq: Three times a day (TID) | ORAL | Status: DC | PRN

## 2015-12-05 MED ORDER — ONDANSETRON 4 MG PO TBDP
4.0000 mg | ORAL_TABLET | Freq: Three times a day (TID) | ORAL | Status: DC | PRN
  Filled 2015-12-05: qty 2

## 2015-12-05 MED ORDER — ALUM & MAG HYDROXIDE-SIMETH 200-200-20 MG/5ML PO SUSP: 60.0000 mL | ORAL | Status: DC | PRN

## 2015-12-05 MED ORDER — SENNOSIDES-DOCUSATE SODIUM 8.6-50 MG PO TABS
1.0000 | ORAL_TABLET | Freq: Every evening | ORAL | Status: DC | PRN
  Administered 2015-12-06: 11:00:00 1 via ORAL
  Filled 2015-12-05: qty 1

## 2015-12-05 MED ORDER — ONDANSETRON HCL 4 MG PO TABS: 4.0000 mg | ORAL_TABLET | Freq: Three times a day (TID) | ORAL | Status: DC | PRN

## 2015-12-05 MED ORDER — HYDROCORTISONE 2.5 % RE CREA
1.0000 "application " | TOPICAL_CREAM | Freq: Two times a day (BID) | RECTAL | Status: DC | PRN
  Filled 2015-12-05: qty 28.35

## 2015-12-05 MED ORDER — SODIUM CHLORIDE 0.9 % IV SOLN
Freq: Once | INTRAVENOUS | Status: AC
  Administered 2015-12-05: 17:00:00 via INTRAVENOUS
  Filled 2015-12-05: qty 8

## 2015-12-05 MED ORDER — SODIUM CHLORIDE 0.9 % IV SOLN
Freq: Once | INTRAVENOUS | Status: AC
  Administered 2015-12-05: 17:00:00 via INTRAVENOUS
  Filled 2015-12-05 (×2): qty 126

## 2015-12-05 MED ORDER — SODIUM CHLORIDE 0.9 % IV SOLN
8.0000 mg | Freq: Three times a day (TID) | INTRAVENOUS | Status: DC | PRN
  Filled 2015-12-05: qty 4

## 2015-12-05 MED ORDER — DIAZEPAM 5 MG PO TABS: 5.0000 mg | ORAL_TABLET | Freq: Three times a day (TID) | ORAL | Status: DC | PRN

## 2015-12-05 NOTE — Progress Notes (Signed)
Patient here today to have labs and see MD.  She will then be admitted to room 101 for metotrexate infusion.Marland Kitchen

## 2015-12-05 NOTE — Care Management Note (Signed)
Case Management Note  Patient Details  Name: Stacy Murillo MRN: [id number] Date of Birth: 1955/07/13  Subjective/Objective:                 Spoke with patient in the room. She lives at home with her husband in [place]. She drives, is independent, denies difficulties getting medications. Patient states that she has surplus DME at home and has no needs at this time. Admitted for chemo infusion.    Action/Plan:  Will DC to home when medically clear.   Expected Discharge Date:                  Expected Discharge Plan:  Home/Self Care  In-House Referral:     Discharge planning Services  CM Consult  Post Acute Care Choice:  NA Choice offered to:  NA  DME Arranged:  N/A DME Agency:  NA  HH Arranged:  NA HH Agency:  NA  Status of Service:  Completed, signed off  If discussed at [place] of Stay Meetings, dates discussed:    Additional Comments:  Carles Collet, RN 12/05/2015, 2:15 PM

## 2015-12-05 NOTE — Progress Notes (Signed)
MEDICATION RELATED CONSULT NOTE - INITIAL   Pharmacy Consult for Methotrexate monitoring Indication: Monitoring of methotrexate levels and ordering leucovorin  No Known Allergies  Patient Measurements: Height: 5\' 2"  (157.5 cm) Weight: 163 lb (73.9 kg) IBW/kg (Calculated) : 50.1   Vital Signs: Temp: 98.1 F (36.7 C) (07/24 1331) Temp Source: Oral (07/24 1331) BP: 123/69 (07/24 1331) Pulse Rate: 79 (07/24 1331) Intake/Output from previous day: No intake/output data recorded. Intake/Output from this shift: Total I/O In: -  Out: 650 [Urine:650]  Labs:  Recent Labs  12/05/15 0855  WBC 5.8  HGB 11.9*  HCT 34.9*  PLT 228  CREATININE 0.61  ALBUMIN 4.1  PROT 6.3*  AST 18  ALT 14  ALKPHOS 59  BILITOT 0.5   Estimated Creatinine Clearance: 69.5 mL/min (by C-G formula based on SCr of 0.8 mg/dL).   Assessment: 60 yo female to start on high dose methotrexate infusion   Plan:  Per Dr. Rogue Bussing, do not start the methotrexate infusion until urine pH is at 7.0; NaBicarb drip to continue at least 48h after MTX infusion. Per MD, check urine pH at least daily.    Pt needs MTX level ordered at 24h after start of infusion (i.e. 20 h after the end of infusion).  Based on MTX level, leucovorin dose will need to be ordered and adjusted based on the daily MTX level. Pt will need daily MTX levels and urine pH monitoring until MTX levels are <0.1 microM.  Pharmacy to follow up MTX level and dose leucovorin tomorrow.   If urine pH less than goal consider increasing NaBicarb drip by 25 ml/hr, or if rate exceeds 175 ml/hr on the current NaBicarb drip, may need to increase bicarb in bag to 150 mEq. Checking urine pH every two hours until pH >7.0. Care order instruction for RN to enter urine pH orders.  7/24: Urine pH = 7.  MTX level ordered to be drawn  @ 17:00 on 7/25 which will be 24 hours post initiation of MTX.    Renesmee Raine D 12/05/2015,5:55 PM

## 2015-12-05 NOTE — Progress Notes (Addendum)
MEDICATION RELATED CONSULT NOTE - INITIAL   Pharmacy Consult for Methotrexate monitoring Indication: Monitoring of methotrexate levels and ordering leucovorin  No Known Allergies  Patient Measurements: Height: 5\' 2"  (157.5 cm) Weight: 163 lb (73.9 kg) IBW/kg (Calculated) : 50.1   Vital Signs: Temp: 98.1 F (36.7 C) (07/24 1331) Temp Source: Oral (07/24 1331) BP: 123/69 (07/24 1331) Pulse Rate: 79 (07/24 1331) Intake/Output from previous day: No intake/output data recorded. Intake/Output from this shift: No intake/output data recorded.  Labs:  Recent Labs  12/05/15 0855  WBC 5.8  HGB 11.9*  HCT 34.9*  PLT 228  CREATININE 0.61  ALBUMIN 4.1  PROT 6.3*  AST 18  ALT 14  ALKPHOS 59  BILITOT 0.5   Estimated Creatinine Clearance: 69.5 mL/min (by C-G formula based on SCr of 0.8 mg/dL).   Assessment: 60 yo female to start on high dose methotrexate infusion   Plan:  Per Dr. Rogue Bussing, do not start the methotrexate infusion until urine pH is at 7.0; NaBicarb drip to continue at least 48h after MTX infusion. Per MD, check urine pH at least daily.    Pt needs MTX level ordered at 24h after start of infusion (i.e. 20 h after the end of infusion).  Based on MTX level, leucovorin dose will need to be ordered and adjusted based on the daily MTX level. Pt will need daily MTX levels and urine pH monitoring until MTX levels are <0.1 microM.  Pharmacy to follow up MTX level and dose leucovorin tomorrow.   If urine pH less than goal consider increasing NaBicarb drip by 25 ml/hr, or if rate exceeds 175 ml/hr on the current NaBicarb drip, may need to increase bicarb in bag to 150 mEq. Checking urine pH every two hours until pH >7.0. Care order instruction for RN to enter urine pH orders.   Rocky Morel 12/05/2015,3:14 PM

## 2015-12-05 NOTE — H&P (Signed)
[place] NOTE  Patient Care Team: Jackolyn Confer, MD as PCP - General (Internal Medicine) Cammie Sickle, MD as Consulting Physician (Internal Medicine) Seeplaputhur Robinette Haines, MD (General Surgery) [name], MD as Consulting Physician (Cardiology)  CHIEF COMPLAINTS/PURPOSE OF CONSULTATION:  Diffuse large cell lymphoma  HISTORY OF PRESENTING ILLNESS:  Stacy Murillo 60 y.o.  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  Patient is currently admitted to the hospital for high-dose methotrexate prophylaxis- given the involvement of her peripheral blood with lymphoma.  Patient denies any shortness of breath. Denies any chest pain. Appetite is good. No weight loss. No nausea no vomiting. No sores in the mouth.  ROS: A complete 10 point review of system is done which is negative except mentioned above in history of present illness  MEDICAL HISTORY:  Past Medical History:  Diagnosis Date  . Arthritis   . Blood dyscrasia   . Cancer ([place])   . Chest pain, unspecified   . Diverticulosis 07/06/15  . Dysrhythmia   . Esophagitis 07/06/15  . Fatigue   . Gastritis 07/06/15  . GERD (gastroesophageal reflux disease)   . 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 Leg cramps   . Lymphoma ([place]) 09/14/15   Monoclonal B cell lymphoma  . Night sweats   . Osteoporosis   . Overweight(278.02)    Obesity  . Palpitations   . Shortness of breath dyspnea   . Tachycardia   . Thrombocytopenia ([place])     SURGICAL HISTORY: Past Surgical History:  Procedure Laterality Date  . ABDOMINAL HYSTERECTOMY  2005  . BONE MARROW BIOPSY  09/14/15  . CHOLECYSTECTOMY  1997  . COLONOSCOPY  07/05/2014  . ESOPHAGOGASTRODUODENOSCOPY  07/05/2014  . INGUINAL LYMPH NODE BIOPSY Right 10/05/2015   Procedure: INGUINAL LYMPH NODE BIOPSY;  Surgeon: Christene Lye, MD;  Location: [facility];  Service: General;  Laterality: Right;  . PERIPHERAL VASCULAR CATHETERIZATION N/A 09/27/2015   Procedure: Glori Luis Cath Insertion;  Surgeon: Algernon Huxley, MD;  Location: [place] [facility];  Service: Cardiovascular;  Laterality: N/A;  . PORTACATH PLACEMENT Right     SOCIAL HISTORY: Social History   Social History  . Marital status: Married    Spouse name: N/A  . Number of children: N/A  . Years of education: N/A   Occupational History  . Not on file.   Social History Main Topics  . Smoking status: Never Smoker  . Smokeless tobacco: Never Used  . Alcohol use No  . Drug use: No  . Sexual activity: Not on file   Other Topics Concern  . Not on file   Social History Narrative   Married   Does not get regular exercise    FAMILY HISTORY: Family History  Problem Relation Age of Onset  . Diabetes Neg Hx   . Heart disease Father     CABG x 4  . Hypertension Mother   . Pancreatic cancer Mother   . Ulcers Mother   . Asthma Mother     ALLERGIES:  has No Known Allergies.  MEDICATIONS:  Current Facility-Administered Medications  Medication Dose Route Frequency Provider Last Rate Last Dose  . acetaminophen (TYLENOL) tablet 650 mg  650 mg Oral Q4H PRN Cammie Sickle, MD      . acyclovir (ZOVIRAX) 200 MG capsule 400 mg  400 mg  Oral BID Cammie Sickle, MD      . alum & mag hydroxide-simeth (MAALOX/MYLANTA) 200-200-20 MG/5ML suspension 60 mL  60 mL Oral Q4H PRN Cammie Sickle, MD      . apixaban (ELIQUIS) tablet 5 mg  5 mg Oral BID Cammie Sickle, MD      . cyclobenzaprine (FLEXERIL) tablet 5 mg  5 mg Oral TID PRN Cammie Sickle, MD      . diazepam (VALIUM) tablet 5 mg  5 mg Oral Q8H PRN Cammie Sickle, MD      . famotidine (PEPCID) tablet 20 mg  20 mg Oral BID Cammie Sickle, MD      . guaiFENesin-dextromethorphan (ROBITUSSIN DM) 100-10 MG/5ML syrup 10 mL  10 mL Oral Q4H PRN Cammie Sickle, MD       . hydrocortisone (ANUSOL-HC) 2.5 % rectal cream 1 application  1 application Rectal BID PRN Cammie Sickle, MD      . lidocaine-prilocaine (EMLA) cream 1 application  1 application Topical PRN Cammie Sickle, MD      . methotrexate (50 mg/ml) 6.3 g in sodium chloride 0.9 % 1,000 mL injection   Intravenous Once Cammie Sickle, MD      . ondansetron (ZOFRAN) tablet 4-8 mg  4-8 mg Oral Q8H PRN Cammie Sickle, MD       Or  . ondansetron (ZOFRAN-ODT) disintegrating tablet 4-8 mg  4-8 mg Oral Q8H PRN Cammie Sickle, MD       Or  . ondansetron (ZOFRAN) injection 4 mg  4 mg Intravenous Q8H PRN Cammie Sickle, MD       Or  . ondansetron (ZOFRAN) 8 mg in sodium chloride 0.9 % 50 mL IVPB  8 mg Intravenous Q8H PRN Cammie Sickle, MD      . oxyCODONE-acetaminophen (PERCOCET) 7.5-325 MG per tablet 1 tablet  1 tablet Oral Q6H PRN Cammie Sickle, MD      . prochlorperazine (COMPAZINE) tablet 10 mg  10 mg Oral Q6H PRN Cammie Sickle, MD      . senna-docusate (Senokot-S) tablet 1 tablet  1 tablet Oral QHS PRN Cammie Sickle, MD      . sodium chloride 0.225 % with sodium bicarbonate 100 mEq infusion   Intravenous Continuous Cammie Sickle, MD 125 mL/hr at 12/05/15 1318     Facility-Administered Medications Ordered in Other Encounters  Medication Dose Route Frequency Provider Last Rate Last Dose  . 0.9 %  sodium chloride infusion   Intravenous Continuous Evlyn Kanner, NP   Stopped at 10/14/15 1312      .  PHYSICAL EXAMINATION: ECOG PERFORMANCE STATUS: 0 - Asymptomatic  Vitals:   12/05/15 1026 12/05/15 1331  BP: 121/68 123/69  Pulse: 76 79  Resp: 18 20  Temp: 98.1 F (36.7 C) 98.1 F (36.7 C)   Filed Weights   12/05/15 1026 12/05/15 1045  Weight: 163 lb (73.9 kg) 163 lb (73.9 kg)    GENERAL: Well-nourished well-developed; Alert, no distress and comfortable.  Accompanied by her husband.  EYES: no pallor or icterus OROPHARYNX:  no thrush or ulceration; good dentition  NECK: supple, no masses felt LYMPH:  no palpable lymphadenopathy in the cervical, axillary or inguinal regions LUNGS: clear to auscultation and  No wheeze or crackles HEART/CVS: regular rate & rhythm and no murmurs; No lower extremity edema ABDOMEN: abdomen soft, non-tender and normal bowel sounds Musculoskeletal:no cyanosis of digits and no clubbing  PSYCH:  alert & oriented x 3 with fluent speech NEURO: no focal motor/sensory deficits SKIN:  no rashes or significant lesions  LABORATORY DATA:  I have reviewed the data as listed Lab Results  Component Value Date   WBC 5.8 12/05/2015   HGB 11.9 (L) 12/05/2015   HCT 34.9 (L) 12/05/2015   MCV 92.2 12/05/2015   PLT 228 12/05/2015    Recent Labs  10/20/15 0237  11/07/15 1440 11/17/15 0852 11/28/15 1030 12/05/15 0855  NA  --   < > 140 140 138 138  K  --   < > 3.8 3.8 4.0 3.6  CL  --   < > 104 109 105 107  CO2  --   < > 30 24 29 25   GLUCOSE  --   < > 108* 102* 95 121*  BUN  --   < > 11 16 12 17   CREATININE  --   < > 0.68 0.72 0.65 0.61  CALCIUM  --   < > 8.9 8.9 8.9 9.1  GFRNONAA  --   < > >60 >60 >60 >60  GFRAA  --   < > >60 >60 >60 >60  PROT 5.5*  < > 7.6 6.0*  --  6.3*  ALBUMIN 3.2*  < > 3.9 3.9  --  4.1  AST 61*  < > 20 21  --  18  ALT 183*  < > 19 16  --  14  ALKPHOS 199*  < > 109 70  --  59  BILITOT 0.3  < > 0.4 0.4  --  0.5  BILIDIR <0.1*  --  <0.1*  --   --   --   IBILI NOT CALCULATED  --  NOT CALCULATED  --   --   --   < > = values in this interval not displayed.  RADIOGRAPHIC STUDIES: I have personally reviewed the radiological images as listed and agreed with the findings in the report. Nm Pet Image Restag (ps) Skull Base To Thigh  Result Date: 11/11/2015 CLINICAL DATA:  Subsequent treatment strategy for large-cell lymphoma involving abdominal lymph nodes EXAM: NUCLEAR MEDICINE PET SKULL BASE TO THIGH TECHNIQUE: 12.6 mCi F-18 FDG was injected intravenously. Full-ring PET  imaging was performed from the skull base to thigh after the radiotracer. CT data was obtained and used for attenuation correction and anatomic localization. FASTING BLOOD GLUCOSE:  Value: 97 mg/dl COMPARISON:  Multiple exams, including 09/26/2015 PET-CT FINDINGS: NECK No hypermetabolic lymph nodes in the neck. Prior hypermetabolic adenopathy has resolved. CHEST No hypermetabolic mediastinal or hilar nodes. Prior hypermetabolic adenopathy has resolved. No suspicious pulmonary nodules on the CT data. Mild cardiomegaly. ABDOMEN/PELVIS No abnormal hypermetabolic activity within the liver, pancreas, adrenal glands, or spleen. Prior splenomegaly and prior abnormal hypermetabolic activity in the spleen has resolved. No hypermetabolic lymph nodes in the abdomen or pelvis. Prior hypermetabolic lymph nodes have resolved. Aortoiliac atherosclerotic vascular disease. Cholecystectomy. Hysterectomy. Sigmoid diverticulosis. SKELETON Generalized accentuated marrow activity throughout the skeleton is much less striking than on the prior exam, and may be the result of marrow stimulation. IMPRESSION: 1. Resolution of prior adenopathy in the neck, chest, abdomen, and pelvis. Specifically, the prior enlarged lymph nodes have resolved and no hypermetabolic adenopathy is identified currently. 2. There is accentuated metabolic activity in the skeleton, although much less than on the prior exam. Some this accentuation is probably due to marrow stimulation. 3. Prior splenomegaly has resolved and the spleen is currently normal in activity. 4. Additional findings at  individual levels are as follows: Aortoiliac atherosclerotic vascular disease. Mild cardiomegaly. Sigmoid diverticulosis. Electronically Signed   By: Van Clines M.D.   On: 11/11/2015 11:31    ASSESSMENT & PLAN:   # Diffuse large B-cell lymphoma- stage IV- currently admitted to the hospital for high-dose methotrexate.  # CNS prophylaxis- with high-dose methotrexate  [3.5 g/m]. Protocol will be followed. Discussed with pharmacy/ patient to start methotrexate levels drawn on 7/25- #1 [24 hours post start of methotrexate infusion]. She will need daily methotrexate levels to adjust her leucovorin dose. Continue checking urinary pH/daily labs.   # I discussed the potential side effects of high-dose methotrexate including but not limited to infection/drop of blood counts/ also mucositis/ renal failure.   # Diffuse large B-cell lymphoma status post 3 cycles of R CHOP- excellent response to chemotherapy. The plan cycle #4 post the current high-dose methotrexate.  # Recent DVT Eliquis continue the same.  The above plan of care was discussed the patient and family in detail. I expect the patient to stay in hospital 3-5 days.     Cammie Sickle, MD 12/05/2015 5:16 PM

## 2015-12-06 ENCOUNTER — Other Ambulatory Visit: Payer: Self-pay

## 2015-12-06 DIAGNOSIS — C8583 Other specified types of non-Hodgkin lymphoma, intra-abdominal lymph nodes: Secondary | ICD-10-CM

## 2015-12-06 DIAGNOSIS — D696 Thrombocytopenia, unspecified: Secondary | ICD-10-CM

## 2015-12-06 DIAGNOSIS — Z79899 Other long term (current) drug therapy: Secondary | ICD-10-CM

## 2015-12-06 DIAGNOSIS — C8521 Mediastinal (thymic) large B-cell lymphoma, lymph nodes of head, face, and neck: Secondary | ICD-10-CM

## 2015-12-06 LAB — URINE PH
PH: 6 (ref 5.0–8.0)
PH: 8 (ref 5.0–8.0)
PH: 7 (ref 5.0–8.0)
PH: 8 (ref 5.0–8.0)
pH: 7 (ref 5.0–8.0)
pH: 8 (ref 5.0–8.0)
pH: 8 (ref 5.0–8.0)
pH: 9 — ABNORMAL HIGH (ref 5.0–8.0)

## 2015-12-06 LAB — COMPREHENSIVE METABOLIC PANEL
ALT: 45 U/L (ref 14–54)
AST: 57 U/L — AB (ref 15–41)
Albumin: 3.8 g/dL (ref 3.5–5.0)
Alkaline Phosphatase: 58 U/L (ref 38–126)
Anion gap: 9 (ref 5–15)
BILIRUBIN TOTAL: 2 mg/dL — AB (ref 0.3–1.2)
BUN: 15 mg/dL (ref 6–20)
CO2: 29 mmol/L (ref 22–32)
CREATININE: 0.74 mg/dL (ref 0.44–1.00)
Calcium: 9 mg/dL (ref 8.9–10.3)
Chloride: 103 mmol/L (ref 101–111)
Glucose, Bld: 121 mg/dL — ABNORMAL HIGH (ref 65–99)
POTASSIUM: 3.7 mmol/L (ref 3.5–5.1)
Sodium: 141 mmol/L (ref 135–145)
TOTAL PROTEIN: 6 g/dL — AB (ref 6.5–8.1)

## 2015-12-06 LAB — CBC WITH DIFFERENTIAL/PLATELET
BASOS ABS: 0 10*3/uL (ref 0–0.1)
Basophils Relative: 0 %
EOS ABS: 0 10*3/uL (ref 0–0.7)
EOS PCT: 0 %
HCT: 32.4 % — ABNORMAL LOW (ref 35.0–47.0)
Hemoglobin: 11.6 g/dL — ABNORMAL LOW (ref 12.0–16.0)
LYMPHS PCT: 2 %
Lymphs Abs: 0.2 10*3/uL — ABNORMAL LOW (ref 1.0–3.6)
MCH: 32.4 pg (ref 26.0–34.0)
MCHC: 35.6 g/dL (ref 32.0–36.0)
MCV: 91 fL (ref 80.0–100.0)
Monocytes Absolute: 0.2 10*3/uL (ref 0.2–0.9)
Monocytes Relative: 1 %
Neutro Abs: 13.1 10*3/uL — ABNORMAL HIGH (ref 1.4–6.5)
Neutrophils Relative %: 97 %
PLATELETS: 193 10*3/uL (ref 150–440)
RBC: 3.56 MIL/uL — AB (ref 3.80–5.20)
RDW: 18 % — ABNORMAL HIGH (ref 11.5–14.5)
WBC: 13.5 10*3/uL — AB (ref 3.6–11.0)

## 2015-12-06 LAB — CREATININE, SERUM
CREATININE: 0.61 mg/dL (ref 0.44–1.00)
GFR calc Af Amer: >60 mL/min (ref >60)

## 2015-12-06 MED ORDER — LEUCOVORIN CALCIUM 50 MG IJ SOLR
10.0000 mg/m2 | Freq: Four times a day (QID) | INTRAMUSCULAR | Status: DC
  Administered 2015-12-06 – 2015-12-10 (×16): 18 mg via INTRAVENOUS
  Filled 2015-12-06 (×21): qty 0.9

## 2015-12-06 MED ORDER — LEUCOVORIN CALCIUM INJECTION 100 MG
10.0000 mg/m2 | Freq: Four times a day (QID) | INTRAMUSCULAR | Status: DC
  Filled 2015-12-06 (×2): qty 0.9

## 2015-12-06 MED ORDER — FAMOTIDINE 20 MG PO TABS
20.0000 mg | ORAL_TABLET | Freq: Two times a day (BID) | ORAL | Status: DC
  Administered 2015-12-07 – 2015-12-10 (×7): 20 mg via ORAL
  Filled 2015-12-06 (×7): qty 1

## 2015-12-06 MED ORDER — SENNOSIDES-DOCUSATE SODIUM 8.6-50 MG PO TABS
1.0000 | ORAL_TABLET | Freq: Two times a day (BID) | ORAL | Status: DC
  Administered 2015-12-06 – 2015-12-09 (×5): 1 via ORAL
  Filled 2015-12-06 (×8): qty 1

## 2015-12-06 MED ORDER — LEUCOVORIN CALCIUM INJECTION 100 MG: 10.0000 mg/m2 | Freq: Four times a day (QID) | INTRAMUSCULAR | Status: DC

## 2015-12-06 NOTE — Progress Notes (Signed)
MEDICATION RELATED CONSULT NOTE - INITIAL   Pharmacy Consult for Methotrexate monitoring Indication: Monitoring of methotrexate levels and ordering leucovorin  No Known Allergies  Patient Measurements: Height: 5\' 2"  (157.5 cm) Weight: 163 lb (73.9 kg) IBW/kg (Calculated) : 50.1   Vital Signs: Temp: 98.2 F (36.8 C) (07/25 1322) Temp Source: Oral (07/25 1322) BP: 115/58 (07/25 1322) Pulse Rate: 69 (07/25 1322) Intake/Output from previous day: 07/24 0701 - 07/25 0700 In: 1348.3 [P.O.:240; I.V.:1108.3] Out: 3450 [Urine:3450] Intake/Output from this shift: Total I/O In: -  Out: 1850 [Urine:1850]  Labs:  Recent Labs  12/05/15 0855 12/06/15 0506  WBC 5.8 13.5*  HGB 11.9* 11.6*  HCT 34.9* 32.4*  PLT 228 193  CREATININE 0.61 0.74  ALBUMIN 4.1 3.8  PROT 6.3* 6.0*  AST 18 57*  ALT 14 45  ALKPHOS 59 58  BILITOT 0.5 2.0*   Estimated Creatinine Clearance: 69.5 mL/min (by C-G formula based on SCr of 0.8 mg/dL).   Assessment: 60 yo female to start on high dose methotrexate infusion   Plan:  Per Dr. Rogue Bussing, do not start the methotrexate infusion until urine pH is at 7.0; NaBicarb drip to continue at least 48h after MTX infusion. Per MD, check urine pH at least daily.    Pt needs MTX level ordered at 24h after start of infusion (i.e. 20 h after the end of infusion).  Based on MTX level, leucovorin dose will need to be ordered and adjusted based on the daily MTX level. Pt will need daily MTX levels and urine pH monitoring until MTX levels are <0.1 microM.  Pharmacy to follow up MTX level and dose leucovorin tomorrow.   If urine pH less than goal consider increasing NaBicarb drip by 25 ml/hr, or if rate exceeds 175 ml/hr on the current NaBicarb drip, may need to increase bicarb in bag to 150 mEq. Checking urine pH every two hours until pH >7.0. Care order instruction for RN to enter urine pH orders.  7/24: Urine pH = 7.  MTX level ordered to be drawn  @ 17:00 on 7/25  which will be 24 hours post initiation of MTX.   7/25 Patient's urine pH is being maintained above 7.0 consistantly with current bicarb drip. Spoke with RN and advised her that the pH checks can be cut back to q4h. Methotrexate level to be drawn at 17:00 and will begin leucovorin 18mg  (10mg /m2) IV q6h at 17:00. Will follow up on Methotrexate level and adjust leucovorin dose accordingly.  Paulina Fusi, PharmD, BCPS 12/06/2015 4:39 PM

## 2015-12-06 NOTE — Progress Notes (Signed)
[place]  [phone] [phone]  ID: Stacy Murillo OB: April 24, 1956  MR#: [id number]  TDD#:[id number]  Patient Care Team: Jackolyn Confer, MD as PCP - General (Internal Medicine) Cammie Sickle, MD as Consulting Physician (Internal Medicine) Seeplaputhur Robinette Haines, MD (General Surgery) [name], MD as Consulting Physician (Cardiology)  CHIEF COMPLAINT: Diffuse large B-cell lymphoma- stage IV- currently admitted to the hospital for high-dose methotrexate.  INTERVAL HISTORY: Patient doing well today. She tolerated her treatment without significant side effects. Patient offers no specific complaints today.  REVIEW OF SYSTEMS:   Review of Systems  Constitutional: Negative.  Negative for fever, malaise/fatigue and weight loss.  Respiratory: Negative.  Negative for shortness of breath.   Cardiovascular: Negative.  Negative for chest pain.  Gastrointestinal: Negative.  Negative for abdominal pain.  Musculoskeletal: Negative.   Neurological: Negative.  Negative for weakness.  Psychiatric/Behavioral: Negative.     As per HPI. Otherwise, a complete review of systems is negatve.  PAST MEDICAL HISTORY: Past Medical History:  Diagnosis Date  . Arthritis   . Blood dyscrasia   . Cancer ([place])   . Chest pain, unspecified   . Diverticulosis 07/06/15  . Dysrhythmia   . Esophagitis 07/06/15  . Fatigue   . Gastritis 07/06/15  . GERD (gastroesophageal reflux disease)   . 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 Leg cramps   . Lymphoma ([place]) 09/14/15   Monoclonal B cell lymphoma  . Night sweats   . Osteoporosis   . Overweight(278.02)    Obesity  . Palpitations   . Shortness of breath dyspnea   . Tachycardia   . Thrombocytopenia ([place])     PAST SURGICAL HISTORY: Past Surgical History:  Procedure Laterality Date  . ABDOMINAL HYSTERECTOMY  2005  . BONE MARROW BIOPSY  09/14/15  . CHOLECYSTECTOMY  1997  . COLONOSCOPY  07/05/2014  .  ESOPHAGOGASTRODUODENOSCOPY  07/05/2014  . INGUINAL LYMPH NODE BIOPSY Right 10/05/2015   Procedure: INGUINAL LYMPH NODE BIOPSY;  Surgeon: Christene Lye, MD;  Location: [facility];  Service: General;  Laterality: Right;  . PERIPHERAL VASCULAR CATHETERIZATION N/A 09/27/2015   Procedure: Glori Luis Cath Insertion;  Surgeon: Algernon Huxley, MD;  Location: [place] [facility];  Service: Cardiovascular;  Laterality: N/A;  . PORTACATH PLACEMENT Right     FAMILY HISTORY: Family History  Problem Relation Age of Onset  . Diabetes Neg Hx   . Heart disease Father     CABG x 4  . Hypertension Mother   . Pancreatic cancer Mother   . Ulcers Mother   . Asthma Mother        ADVANCED DIRECTIVES:    HEALTH MAINTENANCE: Social History  Substance Use Topics  . Smoking status: Never Smoker  . Smokeless tobacco: Never Used  . Alcohol use No     Colonoscopy:  PAP:  Bone density:  Lipid panel:  No Known Allergies  Current Facility-Administered Medications  Medication Dose Route Frequency Provider Last Rate Last Dose  . acetaminophen (TYLENOL) tablet 650 mg  650 mg Oral Q4H PRN Cammie Sickle, MD      . acyclovir (ZOVIRAX) 200 MG capsule 400 mg  400 mg Oral BID Cammie Sickle, MD   400 mg at 12/06/15 1022  . alum & mag hydroxide-simeth (MAALOX/MYLANTA) 200-200-20 MG/5ML suspension 60 mL  60 mL Oral Q4H PRN Cammie Sickle, MD      . apixaban (ELIQUIS) tablet 5 mg  5 mg Oral BID Cammie Sickle,  MD   5 mg at 12/06/15 1022  . cyclobenzaprine (FLEXERIL) tablet 5 mg  5 mg Oral TID PRN Cammie Sickle, MD      . diazepam (VALIUM) tablet 5 mg  5 mg Oral Q8H PRN Cammie Sickle, MD      . famotidine (PEPCID) tablet 20 mg  20 mg Oral BID Cammie Sickle, MD   20 mg at 12/06/15 0704  . guaiFENesin-dextromethorphan (ROBITUSSIN DM) 100-10 MG/5ML syrup 10 mL  10 mL Oral Q4H PRN Cammie Sickle, MD      . hydrocortisone (ANUSOL-HC) 2.5 % rectal cream 1 application  1  application Rectal BID PRN Cammie Sickle, MD      . leucovorin injection 18 mg  10 mg/m2 Intravenous Q6H Cammie Sickle, MD   18 mg at 12/06/15 1654  . lidocaine-prilocaine (EMLA) cream 1 application  1 application Topical PRN Cammie Sickle, MD      . ondansetron (ZOFRAN) tablet 4-8 mg  4-8 mg Oral Q8H PRN Cammie Sickle, MD       Or  . ondansetron (ZOFRAN-ODT) disintegrating tablet 4-8 mg  4-8 mg Oral Q8H PRN Cammie Sickle, MD       Or  . ondansetron (ZOFRAN) injection 4 mg  4 mg Intravenous Q8H PRN Cammie Sickle, MD       Or  . ondansetron (ZOFRAN) 8 mg in sodium chloride 0.9 % 50 mL IVPB  8 mg Intravenous Q8H PRN Cammie Sickle, MD      . oxyCODONE-acetaminophen (PERCOCET) 7.5-325 MG per tablet 1 tablet  1 tablet Oral Q6H PRN Cammie Sickle, MD      . prochlorperazine (COMPAZINE) tablet 10 mg  10 mg Oral Q6H PRN Cammie Sickle, MD      . senna-docusate (Senokot-S) tablet 1 tablet  1 tablet Oral QHS PRN Cammie Sickle, MD   1 tablet at 12/06/15 1040  . sodium chloride 0.225 % with sodium bicarbonate 100 mEq infusion   Intravenous Continuous Cammie Sickle, MD 125 mL/hr at 12/06/15 1559     Facility-Administered Medications Ordered in Other Encounters  Medication Dose Route Frequency Provider Last Rate Last Dose  . 0.9 %  sodium chloride infusion   Intravenous Continuous Evlyn Kanner, NP   Stopped at 10/14/15 1312    OBJECTIVE: Vitals:   12/06/15 0505 12/06/15 1322  BP: (!) 120/53 (!) 115/58  Pulse: 64 69  Resp:  20  Temp: 97.8 F (36.6 C) 98.2 F (36.8 C)     Body mass index is 29.81 kg/m.    ECOG FS:0 - Asymptomatic  General: Well-developed, well-nourished, no acute distress. Eyes: Pink conjunctiva, anicteric sclera. Lungs: Clear to auscultation bilaterally. Heart: Regular rate and rhythm. No rubs, murmurs, or gallops. Abdomen: Soft, nontender, nondistended. No organomegaly noted, normoactive bowel  sounds. Musculoskeletal: No edema, cyanosis, or clubbing. Neuro: Alert, answering all questions appropriately. Cranial nerves grossly intact. Skin: No rashes or petechiae noted. Psych: Normal affect.   LAB RESULTS:  Lab Results  Component Value Date   NA 141 12/06/2015   K 3.7 12/06/2015   CL 103 12/06/2015   CO2 29 12/06/2015   GLUCOSE 121 (H) 12/06/2015   BUN 15 12/06/2015   CREATININE 0.61 12/06/2015   CALCIUM 9.0 12/06/2015   PROT 6.0 (L) 12/06/2015   ALBUMIN 3.8 12/06/2015   AST 57 (H) 12/06/2015   ALT 45 12/06/2015   ALKPHOS 58 12/06/2015   BILITOT 2.0 (H)  12/06/2015   GFRNONAA >60 12/06/2015   GFRAA >60 12/06/2015    Lab Results  Component Value Date   WBC 13.5 (H) 12/06/2015   NEUTROABS 13.1 (H) 12/06/2015   HGB 11.6 (L) 12/06/2015   HCT 32.4 (L) 12/06/2015   MCV 91.0 12/06/2015   PLT 193 12/06/2015     STUDIES: Nm Pet Image Restag (ps) Skull Base To Thigh  Result Date: 11/11/2015 CLINICAL DATA:  Subsequent treatment strategy for large-cell lymphoma involving abdominal lymph nodes EXAM: NUCLEAR MEDICINE PET SKULL BASE TO THIGH TECHNIQUE: 12.6 mCi F-18 FDG was injected intravenously. Full-ring PET imaging was performed from the skull base to thigh after the radiotracer. CT data was obtained and used for attenuation correction and anatomic localization. FASTING BLOOD GLUCOSE:  Value: 97 mg/dl COMPARISON:  Multiple exams, including 09/26/2015 PET-CT FINDINGS: NECK No hypermetabolic lymph nodes in the neck. Prior hypermetabolic adenopathy has resolved. CHEST No hypermetabolic mediastinal or hilar nodes. Prior hypermetabolic adenopathy has resolved. No suspicious pulmonary nodules on the CT data. Mild cardiomegaly. ABDOMEN/PELVIS No abnormal hypermetabolic activity within the liver, pancreas, adrenal glands, or spleen. Prior splenomegaly and prior abnormal hypermetabolic activity in the spleen has resolved. No hypermetabolic lymph nodes in the abdomen or pelvis. Prior  hypermetabolic lymph nodes have resolved. Aortoiliac atherosclerotic vascular disease. Cholecystectomy. Hysterectomy. Sigmoid diverticulosis. SKELETON Generalized accentuated marrow activity throughout the skeleton is much less striking than on the prior exam, and may be the result of marrow stimulation. IMPRESSION: 1. Resolution of prior adenopathy in the neck, chest, abdomen, and pelvis. Specifically, the prior enlarged lymph nodes have resolved and no hypermetabolic adenopathy is identified currently. 2. There is accentuated metabolic activity in the skeleton, although much less than on the prior exam. Some this accentuation is probably due to marrow stimulation. 3. Prior splenomegaly has resolved and the spleen is currently normal in activity. 4. Additional findings at individual levels are as follows: Aortoiliac atherosclerotic vascular disease. Mild cardiomegaly. Sigmoid diverticulosis. Electronically Signed   By: Van Clines M.D.   On: 11/11/2015 11:31    ASSESSMENT/PLAN:    # Diffuse large B-cell lymphoma- stage IV- currently admitted to the hospital for high-dose methotrexate.  # CNS prophylaxis- with high-dose methotrexate [3.5 g/m]. Protocol will be followed. Discussed with pharmacy. First methotrexate levels have been drawn and are pending. She will need daily methotrexate levels to adjust her leucovorin dose. Continue checking urinary pH/daily labs and proceed with alkalinization of urine as per protocol. Previously, potential side effects of high-dose methotrexate including but not limited to infection/drop of blood counts/ also mucositis/ renal failure.   # Diffuse large B-cell lymphoma status post 3 cycles of R CHOP- excellent response to chemotherapy. The plan cycle #4 post the current high-dose methotrexate.  # Recent DVT Eliquis continue the same.    The above plan of care was discussed the patient and family in detail. I expect the patient to stay in hospital total 3-5  days.   Lloyd Huger, MD   12/06/2015 6:07 PM

## 2015-12-07 ENCOUNTER — Encounter: Payer: Self-pay | Admitting: Internal Medicine

## 2015-12-07 DIAGNOSIS — E876 Hypokalemia: Secondary | ICD-10-CM

## 2015-12-07 LAB — URINE PH
PH: 8 (ref 5.0–8.0)
PH: 8 (ref 5.0–8.0)
PH: 9 — AB (ref 5.0–8.0)
pH: 9 — ABNORMAL HIGH (ref 5.0–8.0)
pH: 9 — ABNORMAL HIGH (ref 5.0–8.0)

## 2015-12-07 LAB — CBC WITH DIFFERENTIAL/PLATELET
Basophils Absolute: 0 10*3/uL (ref 0–0.1)
Basophils Relative: 0 %
EOS ABS: 0 10*3/uL (ref 0–0.7)
EOS PCT: 0 %
HCT: 31.1 % — ABNORMAL LOW (ref 35.0–47.0)
Hemoglobin: 10.6 g/dL — ABNORMAL LOW (ref 12.0–16.0)
LYMPHS ABS: 0.6 10*3/uL — AB (ref 1.0–3.6)
Lymphocytes Relative: 8 %
MCH: 31.6 pg (ref 26.0–34.0)
MCHC: 34.2 g/dL (ref 32.0–36.0)
MCV: 92.2 fL (ref 80.0–100.0)
MONOS PCT: 9 %
Monocytes Absolute: 0.7 10*3/uL (ref 0.2–0.9)
Neutro Abs: 5.9 10*3/uL (ref 1.4–6.5)
Neutrophils Relative %: 83 %
PLATELETS: 168 10*3/uL (ref 150–440)
RBC: 3.37 MIL/uL — ABNORMAL LOW (ref 3.80–5.20)
RDW: 18.2 % — AB (ref 11.5–14.5)
WBC: 7.2 10*3/uL (ref 3.6–11.0)

## 2015-12-07 LAB — COMPREHENSIVE METABOLIC PANEL
ALBUMIN: 3.6 g/dL (ref 3.5–5.0)
ALT: 45 U/L (ref 14–54)
AST: 37 U/L (ref 15–41)
Alkaline Phosphatase: 49 U/L (ref 38–126)
Anion gap: 8 (ref 5–15)
BUN: 13 mg/dL (ref 6–20)
CHLORIDE: 103 mmol/L (ref 101–111)
CO2: 34 mmol/L — AB (ref 22–32)
CREATININE: 0.93 mg/dL (ref 0.44–1.00)
Calcium: 8.6 mg/dL — ABNORMAL LOW (ref 8.9–10.3)
GFR calc Af Amer: >60 mL/min (ref >60)
GFR calc non Af Amer: >60 mL/min (ref >60)
GLUCOSE: 88 mg/dL (ref 65–99)
Potassium: 3.2 mmol/L — ABNORMAL LOW (ref 3.5–5.1)
SODIUM: 145 mmol/L (ref 135–145)
Total Bilirubin: 0.8 mg/dL (ref 0.3–1.2)
Total Protein: 5.4 g/dL — ABNORMAL LOW (ref 6.5–8.1)

## 2015-12-07 LAB — METHOTREXATE

## 2015-12-07 MED ORDER — POTASSIUM CHLORIDE CRYS ER 20 MEQ PO TBCR
20.0000 meq | EXTENDED_RELEASE_TABLET | Freq: Three times a day (TID) | ORAL | Status: AC
  Administered 2015-12-07 – 2015-12-09 (×9): 20 meq via ORAL
  Filled 2015-12-07 (×9): qty 1

## 2015-12-07 NOTE — Progress Notes (Signed)
MEDICATION RELATED CONSULT NOTE - INITIAL   Pharmacy Consult for Methotrexate monitoring Indication: Monitoring of methotrexate levels and ordering leucovorin  No Known Allergies  Patient Measurements: Height: 5\' 2"  (157.5 cm) Weight: 163 lb (73.9 kg) IBW/kg (Calculated) : 50.1   Vital Signs: Temp: 98.7 F (37.1 C) (07/25 1938) Temp Source: Oral (07/25 1938) BP: 108/51 (07/25 1938) Pulse Rate: 80 (07/25 1938) Intake/Output from previous day: 07/25 0701 - 07/26 0700 In: -  Out: 3125 [Urine:3125] Intake/Output from this shift: Total I/O In: -  Out: 1275 [Urine:1275]  Labs:  Recent Labs  12/05/15 0855 12/06/15 0506 12/06/15 1700  WBC 5.8 13.5*  --   HGB 11.9* 11.6*  --   HCT 34.9* 32.4*  --   PLT 228 193  --   CREATININE 0.61 0.74 0.61  ALBUMIN 4.1 3.8  --   PROT 6.3* 6.0*  --   AST 18 57*  --   ALT 14 45  --   ALKPHOS 59 58  --   BILITOT 0.5 2.0*  --    Estimated Creatinine Clearance: 69.5 mL/min (by C-G formula based on SCr of 0.8 mg/dL).   Assessment: 60 yo female to start on high dose methotrexate infusion   Plan:  Per Dr. Rogue Bussing, do not start the methotrexate infusion until urine pH is at 7.0; NaBicarb drip to continue at least 48h after MTX infusion. Per MD, check urine pH at least daily.    Pt needs MTX level ordered at 24h after start of infusion (i.e. 20 h after the end of infusion).  Based on MTX level, leucovorin dose will need to be ordered and adjusted based on the daily MTX level. Pt will need daily MTX levels and urine pH monitoring until MTX levels are <0.1 microM.  Pharmacy to follow up MTX level and dose leucovorin tomorrow.   If urine pH less than goal consider increasing NaBicarb drip by 25 ml/hr, or if rate exceeds 175 ml/hr on the current NaBicarb drip, may need to increase bicarb in bag to 150 mEq. Checking urine pH every two hours until pH >7.0. Care order instruction for RN to enter urine pH orders.  7/24: Urine pH = 7.  MTX  level ordered to be drawn  @ 17:00 on 7/25 which will be 24 hours post initiation of MTX.   7/25 Patient's urine pH is being maintained above 7.0 consistantly with current bicarb drip. Spoke with RN and advised her that the pH checks can be cut back to q4h. Methotrexate level to be drawn at 17:00 and will begin leucovorin 18mg  (10mg /m2) IV q6h at 17:00. Will follow up on Methotrexate level and adjust leucovorin dose accordingly.  7/25 17:00 MTX level 1.74 micromol/L. Appears to be normally eliminating. Continue current leucovorin regimen.  Paulina Fusi, PharmD, BCPS 12/07/2015 1:22 AM

## 2015-12-07 NOTE — Progress Notes (Signed)
MEDICATION RELATED CONSULT NOTE - INITIAL   Pharmacy Consult for Methotrexate monitoring Indication: Monitoring of methotrexate levels and ordering leucovorin  No Known Allergies  Patient Measurements: Height: 5\' 2"  (157.5 cm) Weight: 163 lb (73.9 kg) IBW/kg (Calculated) : 50.1   Vital Signs: Temp: 97.8 F (36.6 C) (07/26 1257) Temp Source: Oral (07/26 1257) BP: 117/59 (07/26 1257) Pulse Rate: 79 (07/26 1257) Intake/Output from previous day: 07/25 0701 - 07/26 0700 In: -  Out: 3625 [Urine:3625] Intake/Output from this shift: Total I/O In: 240 [P.O.:240] Out: 1250 [Urine:1250]  Labs:  Recent Labs  12/05/15 0855 12/06/15 0506 12/06/15 1700 12/07/15 0504  WBC 5.8 13.5*  --  7.2  HGB 11.9* 11.6*  --  10.6*  HCT 34.9* 32.4*  --  31.1*  PLT 228 193  --  168  CREATININE 0.61 0.74 0.61 0.93  ALBUMIN 4.1 3.8  --  3.6  PROT 6.3* 6.0*  --  5.4*  AST 18 57*  --  37  ALT 14 45  --  45  ALKPHOS 59 58  --  49  BILITOT 0.5 2.0*  --  0.8   Estimated Creatinine Clearance: 59.8 mL/min (by C-G formula based on SCr of 0.93 mg/dL).    Assessment: 60 yo female to start on high dose methotrexate infusion   Plan:  Per Dr. Rogue Bussing, do not start the methotrexate infusion until urine pH is at 7.0; NaBicarb drip to continue at least 48h after MTX infusion. Per MD, check urine pH at least daily.    Pt needs MTX level ordered at 24h after start of infusion (i.e. 20 h after the end of infusion).  Based on MTX level, leucovorin dose will need to be ordered and adjusted based on the daily MTX level. Pt will need daily MTX levels and urine pH monitoring until MTX levels are <0.1 microM.  Pharmacy to follow up MTX level and dose leucovorin tomorrow.   If urine pH less than goal consider increasing NaBicarb drip by 25 ml/hr, or if rate exceeds 175 ml/hr on the current NaBicarb drip, may need to increase bicarb in bag to 150 mEq. Checking urine pH every two hours until pH >7.0. Care  order instruction for RN to enter urine pH orders.  7/24: Urine pH = 7.  MTX level ordered to be drawn  @ 17:00 on 7/25 which will be 24 hours post initiation of MTX.   7/25 Patient's urine pH is being maintained above 7.0 consistantly with current bicarb drip. Spoke with RN and advised her that the pH checks can be cut back to q4h. Methotrexate level to be drawn at 17:00 and will begin leucovorin 18mg  (10mg /m2) IV q6h at 17:00. Will follow up on Methotrexate level and adjust leucovorin dose accordingly.  7/25 17:00 MTX level 1.74 micromol/L. Appears to be normally eliminating. Continue current leucovorin regimen. Will recheck methotrexate level 7/26 at 1700 and adjust as indicated. Plan to continue leucovorin until methotrexate level < 0.10micromol/L  Rexene Edison, PharmD Clinical Pharmacist 12/07/2015 1:49 PM

## 2015-12-07 NOTE — Progress Notes (Signed)
Stacy Murillo   DOB:May 12, 1956   [id number]    Subjective: Patient complained of mild nausea. No constipation. No vomiting. No sores in the mouth.  No fevers. No chills. No headaches.  ROS: No diarrhea. No shortness of  Objective:  Vitals:   12/06/15 1938 12/07/15 0503  BP: (!) 108/51 (!) 124/55  Pulse: 80 85  Resp: 18 18  Temp: 98.7 F (37.1 C) 99.2 F (37.3 C)     Intake/Output Summary (Last 24 hours) at 12/07/15 0754 Last data filed at 12/07/15 [id number]  Gross per 24 hour  Intake                0 ml  Output             3625 ml  Net            -3625 ml    GENERAL:alert, no distress and comfortable SKIN: skin color, texture, turgor are normal, no rashes or significant lesions EYES: normal, Conjunctiva are pink and non-injected, sclera clear OROPHARYNX:no exudate, no erythema and lips, buccal mucosa, and tongue normal  NECK: supple, thyroid normal size, non-tender, without nodularity LYMPH:  no palpable lymphadenopathy in the cervical, axillary or inguinal LUNGS: clear to auscultation and percussion with normal breathing effort HEART: regular rate & rhythm and no murmurs and no lower extremity edema ABDOMEN:abdomen soft, non-tender and normal bowel sounds Musculoskeletal:no cyanosis of digits and no clubbing  NEURO: alert & oriented x 3 with fluent speech, no focal motor/sensory deficits   Labs:  Lab Results  Component Value Date   WBC 7.2 12/07/2015   HGB 10.6 (L) 12/07/2015   HCT 31.1 (L) 12/07/2015   MCV 92.2 12/07/2015   PLT 168 12/07/2015   NEUTROABS 5.9 12/07/2015    Lab Results  Component Value Date   NA 145 12/07/2015   K 3.2 (L) 12/07/2015   CL 103 12/07/2015   CO2 34 (H) 12/07/2015    Studies:  No results found.  Assessment & Plan:   # Diffuse large B-cell lymphoma- stage IV- currently admitted to the hospital for high-dose methotrexate.  # CNS prophylaxis- with high-dose methotrexate [3.5 g/m]; day #3 today. Awaiting on Mxt levels from today;  continue leucovorin rescue until levels < 0.05; pharmacy following.   # Hypokalemia sec to Bicarb infusion- will supplement   # Diffuse large B-cell lymphoma status post 3 cycles of R CHOP- excellent response to chemotherapy.   # Recent DVT Eliquis continue the same.   Cammie Sickle, MD 12/07/2015  7:54 AM  Addendum: Patient's methotrexate levels- 1.64; as per the nomogram/ pharmacy was continued leucovorin grams every 6 hours. Await methotrexate levels from today. Discussed with pharmacy.

## 2015-12-07 NOTE — Progress Notes (Signed)
Lab reported that Methotrexate level from 12-06-15 at 1700 was 1.74 Dr. Rogue Bussing

## 2015-12-08 DIAGNOSIS — Z86718 Personal history of other venous thrombosis and embolism: Secondary | ICD-10-CM

## 2015-12-08 DIAGNOSIS — D61818 Other pancytopenia: Secondary | ICD-10-CM

## 2015-12-08 DIAGNOSIS — Z7901 Long term (current) use of anticoagulants: Secondary | ICD-10-CM

## 2015-12-08 LAB — CBC WITH DIFFERENTIAL/PLATELET
Basophils Absolute: 0 10*3/uL (ref 0–0.1)
Basophils Relative: 0 %
EOS PCT: 1 %
Eosinophils Absolute: 0 10*3/uL (ref 0–0.7)
HCT: 28.1 % — ABNORMAL LOW (ref 35.0–47.0)
HEMOGLOBIN: 9.5 g/dL — AB (ref 12.0–16.0)
LYMPHS ABS: 0.5 10*3/uL — AB (ref 1.0–3.6)
LYMPHS PCT: 14 %
MCH: 31.1 pg (ref 26.0–34.0)
MCHC: 33.8 g/dL (ref 32.0–36.0)
MCV: 92.1 fL (ref 80.0–100.0)
MONOS PCT: 5 %
Monocytes Absolute: 0.2 10*3/uL (ref 0.2–0.9)
Neutro Abs: 2.6 10*3/uL (ref 1.4–6.5)
Neutrophils Relative %: 80 %
PLATELETS: 135 10*3/uL — AB (ref 150–440)
RBC: 3.05 MIL/uL — AB (ref 3.80–5.20)
RDW: 17.5 % — ABNORMAL HIGH (ref 11.5–14.5)
WBC: 3.2 10*3/uL — AB (ref 3.6–11.0)

## 2015-12-08 LAB — COMPREHENSIVE METABOLIC PANEL
ALBUMIN: 3.3 g/dL — AB (ref 3.5–5.0)
ALT: 31 U/L (ref 14–54)
AST: 23 U/L (ref 15–41)
Alkaline Phosphatase: 43 U/L (ref 38–126)
Anion gap: 5 (ref 5–15)
BUN: 9 mg/dL (ref 6–20)
CHLORIDE: 104 mmol/L (ref 101–111)
CO2: 34 mmol/L — AB (ref 22–32)
Calcium: 8.6 mg/dL — ABNORMAL LOW (ref 8.9–10.3)
Creatinine, Ser: 0.94 mg/dL (ref 0.44–1.00)
GFR calc Af Amer: >60 mL/min (ref >60)
Glucose, Bld: 93 mg/dL (ref 65–99)
POTASSIUM: 3.4 mmol/L — AB (ref 3.5–5.1)
SODIUM: 143 mmol/L (ref 135–145)
Total Bilirubin: 0.9 mg/dL (ref 0.3–1.2)
Total Protein: 5.1 g/dL — ABNORMAL LOW (ref 6.5–8.1)

## 2015-12-08 LAB — URINE PH
pH: 9 — ABNORMAL HIGH (ref 5.0–8.0)
pH: 9 — ABNORMAL HIGH (ref 5.0–8.0)
pH: 9 — ABNORMAL HIGH (ref 5.0–8.0)

## 2015-12-08 NOTE — Progress Notes (Signed)
MEDICATION RELATED CONSULT NOTE - INITIAL   Pharmacy Consult for Methotrexate monitoring Indication: Monitoring of methotrexate levels and ordering leucovorin  No Known Allergies  Patient Measurements: Height: 5\' 2"  (157.5 cm) Weight: 163 lb (73.9 kg) IBW/kg (Calculated) : 50.1   Vital Signs: Temp: 97.8 F (36.6 C) (07/27 0842) Temp Source: Oral (07/27 0842) BP: 136/71 (07/27 0842) Pulse Rate: 75 (07/27 0842) Intake/Output from previous day: 07/26 0701 - 07/27 0700 In: 240 [P.O.:240] Out: 5150 [Urine:5150] Intake/Output from this shift: Total I/O In: -  Out: 700 [Urine:700]  Labs:  Recent Labs  12/06/15 0506 12/06/15 1700 12/07/15 0504 12/08/15 0516  WBC 13.5*  --  7.2 3.2*  HGB 11.6*  --  10.6* 9.5*  HCT 32.4*  --  31.1* 28.1*  PLT 193  --  168 135*  CREATININE 0.74 0.61 0.93 0.94  ALBUMIN 3.8  --  3.6 3.3*  PROT 6.0*  --  5.4* 5.1*  AST 57*  --  37 23  ALT 45  --  45 31  ALKPHOS 58  --  49 43  BILITOT 2.0*  --  0.8 0.9   Estimated Creatinine Clearance: 59.1 mL/min (by C-G formula based on SCr of 0.94 mg/dL).    Assessment: 60 yo female to start on high dose methotrexate infusion   Plan:  Per Dr. Rogue Bussing, do not start the methotrexate infusion until urine pH is at 7.0; NaBicarb drip to continue at least 48h after MTX infusion. Per MD, check urine pH at least daily.    Pt needs MTX level ordered at 24h after start of infusion (i.e. 20 h after the end of infusion).  Based on MTX level, leucovorin dose will need to be ordered and adjusted based on the daily MTX level. Pt will need daily MTX levels and urine pH monitoring until MTX levels are <0.1 microM.  Pharmacy to follow up MTX level and dose leucovorin tomorrow.   If urine pH less than goal consider increasing NaBicarb drip by 25 ml/hr, or if rate exceeds 175 ml/hr on the current NaBicarb drip, may need to increase bicarb in bag to 150 mEq. Checking urine pH every two hours until pH >7.0. Care  order instruction for RN to enter urine pH orders.  7/24: Urine pH = 7.  MTX level ordered to be drawn  @ 17:00 on 7/25 which will be 24 hours post initiation of MTX.   7/25 Patient's urine pH is being maintained above 7.0 consistantly with current bicarb drip. Spoke with RN and advised her that the pH checks can be cut back to q4h. Methotrexate level to be drawn at 17:00 and will begin leucovorin 18mg  (10mg /m2) IV q6h at 17:00. Will follow up on Methotrexate level and adjust leucovorin dose accordingly.  7/25 17:00 MTX level 1.74 micromol/L. Appears to be normally eliminating. Continue current leucovorin regimen.   7/26 1700 MTX level: 0.1micromol/L. Renal function stable. Continue current leucovorin regimen. Will recheck methotrexate level 7/27 at 1700 and adjust as indicated. Plan to continue leucovorin until methotrexate level < 0.47micromol/L  Rexene Edison, PharmD Clinical Pharmacist 12/08/2015 9:16 AM

## 2015-12-08 NOTE — Progress Notes (Signed)
This encounter was created in error - please disregard.

## 2015-12-08 NOTE — Progress Notes (Signed)
Stacy Murillo   DOB:10/08/1955   [id number]    Subjective: Patient complained of mild nausea-yesterday morning. She was tired yesterday; however had a restful sleep overnight. She feels overall improved overnight. No constipation. No vomiting. No sores in the mouth.  No fevers. No chills. No headaches.  ROS: No diarrhea. No shortness of  Objective:  Vitals:   12/07/15 2019 12/08/15 0450  BP: 131/62 (!) 116/55  Pulse: 72 (!) 101  Resp: 20 20  Temp: 98.8 F (37.1 C) 98.7 F (37.1 C)     Intake/Output Summary (Last 24 hours) at 12/08/15 0756 Last data filed at 12/08/15 0537  Gross per 24 hour  Intake              240 ml  Output             5150 ml  Net            -4910 ml    GENERAL:alert, no distress and comfortable SKIN: skin color, texture, turgor are normal, no rashes or significant lesions EYES: normal, Conjunctiva are pink and non-injected, sclera clear OROPHARYNX:no exudate, no erythema and lips, buccal mucosa, and tongue normal  NECK: supple, thyroid normal size, non-tender, without nodularity LYMPH:  no palpable lymphadenopathy in the cervical, axillary or inguinal LUNGS: clear to auscultation and percussion with normal breathing effort HEART: regular rate & rhythm and no murmurs and no lower extremity edema ABDOMEN:abdomen soft, non-tender and normal bowel sounds Musculoskeletal:no cyanosis of digits and no clubbing  NEURO: alert & oriented x 3 with fluent speech, no focal motor/sensory deficits   Labs:  Lab Results  Component Value Date   WBC 3.2 (L) 12/08/2015   HGB 9.5 (L) 12/08/2015   HCT 28.1 (L) 12/08/2015   MCV 92.1 12/08/2015   PLT 135 (L) 12/08/2015   NEUTROABS 2.6 12/08/2015    Lab Results  Component Value Date   NA 143 12/08/2015   K 3.4 (L) 12/08/2015   CL 104 12/08/2015   CO2 34 (H) 12/08/2015    Studies:  No results found.  Assessment & Plan:   # Diffuse large B-cell lymphoma- stage IV- currently admitted to the hospital for  high-dose methotrexate.  # CNS prophylaxis- with high-dose methotrexate [3.5 g/m]; day #4 . Patient's methotrexate levels- 1.64 [day#2]; D#3- MXT- 0.22; Cont as per the nomogram/ pharmacy was continued leucovorin grams every 6 hours.  Continue leucovorin rescue until levels < 0.05; pharmacy following.   # Hypokalemia sec to Bicarb infusion- will continue potassium supplementation 20 meq 3 times a day  # Mild pancytopenia-likely secondary to chemotherapy. Monitor for now  # Diffuse large B-cell lymphoma status post 3 cycles of R CHOP- excellent response to chemotherapy. We will likely have to push the chemotherapy 1 week.   # Recent DVT Eliquis continue the same.   Cammie Sickle, MD 12/08/2015  7:56 AM

## 2015-12-08 NOTE — Progress Notes (Signed)
Urine ph 9.0 at 1700 check.  Leucovorin given/ mtx level  Sent to lab  To be sent out  For results.

## 2015-12-08 NOTE — Plan of Care (Signed)
Problem: Safety: Goal: Ability to remain free from injury will improve Outcome: Progressing amb around hall freq  Problem: Health Behavior/Discharge Planning: Goal: Ability to manage health-related needs will improve Outcome: Progressing mtx level at 0.22  Problem: Activity: Goal: Risk for activity intolerance will decrease Outcome: Progressing See above note

## 2015-12-09 LAB — URINE PH
PH: 9 — AB (ref 5.0–8.0)
PH: 9 — AB (ref 5.0–8.0)
pH: 9 — ABNORMAL HIGH (ref 5.0–8.0)
pH: 9 — ABNORMAL HIGH (ref 5.0–8.0)
pH: 9 — ABNORMAL HIGH (ref 5.0–8.0)

## 2015-12-09 LAB — COMPREHENSIVE METABOLIC PANEL
ALBUMIN: 3.3 g/dL — AB (ref 3.5–5.0)
ALT: 30 U/L (ref 14–54)
ANION GAP: 5 (ref 5–15)
AST: 25 U/L (ref 15–41)
Alkaline Phosphatase: 46 U/L (ref 38–126)
BILIRUBIN TOTAL: 1 mg/dL (ref 0.3–1.2)
BUN: 14 mg/dL (ref 6–20)
CO2: 33 mmol/L — ABNORMAL HIGH (ref 22–32)
Calcium: 8.8 mg/dL — ABNORMAL LOW (ref 8.9–10.3)
Chloride: 103 mmol/L (ref 101–111)
Creatinine, Ser: 0.97 mg/dL (ref 0.44–1.00)
GFR calc Af Amer: >60 mL/min (ref >60)
GLUCOSE: 88 mg/dL (ref 65–99)
POTASSIUM: 3.7 mmol/L (ref 3.5–5.1)
Sodium: 141 mmol/L (ref 135–145)
TOTAL PROTEIN: 5.3 g/dL — AB (ref 6.5–8.1)

## 2015-12-09 LAB — CBC WITH DIFFERENTIAL/PLATELET
BASOS ABS: 0 10*3/uL (ref 0–0.1)
Basophils Relative: 0 %
Eosinophils Absolute: 0 10*3/uL (ref 0–0.7)
Eosinophils Relative: 1 %
HEMATOCRIT: 28.4 % — AB (ref 35.0–47.0)
HEMOGLOBIN: 9.3 g/dL — AB (ref 12.0–16.0)
LYMPHS PCT: 16 %
Lymphs Abs: 0.6 10*3/uL — ABNORMAL LOW (ref 1.0–3.6)
MCH: 30.7 pg (ref 26.0–34.0)
MCHC: 32.8 g/dL (ref 32.0–36.0)
MCV: 93.3 fL (ref 80.0–100.0)
Monocytes Absolute: 0.1 10*3/uL — ABNORMAL LOW (ref 0.2–0.9)
Monocytes Relative: 3 %
NEUTROS ABS: 2.8 10*3/uL (ref 1.4–6.5)
NEUTROS PCT: 80 %
Platelets: 141 10*3/uL — ABNORMAL LOW (ref 150–440)
RBC: 3.04 MIL/uL — AB (ref 3.80–5.20)
RDW: 17.4 % — ABNORMAL HIGH (ref 11.5–14.5)
WBC: 3.5 10*3/uL — AB (ref 3.6–11.0)

## 2015-12-09 NOTE — Progress Notes (Addendum)
MEDICATION RELATED CONSULT NOTE - INITIAL   Pharmacy Consult for Methotrexate monitoring Indication: Monitoring of methotrexate levels and ordering leucovorin  No Known Allergies  Patient Measurements: Height: 5\' 2"  (157.5 cm) Weight: 163 lb (73.9 kg) IBW/kg (Calculated) : 50.1   Vital Signs: Temp: 97.4 F (36.3 C) (07/28 0408) Temp Source: Oral (07/28 0408) BP: 131/56 (07/28 0408) Pulse Rate: 64 (07/28 0408) Intake/Output from previous day: 07/27 0701 - 07/28 0700 In: -  Out: 3325 [Urine:3325] Intake/Output from this shift: Total I/O In: -  Out: 175 [Urine:175]  Labs:  Recent Labs  12/06/15 0506 12/06/15 1700 12/07/15 0504 12/08/15 0516  WBC 13.5*  --  7.2 3.2*  HGB 11.6*  --  10.6* 9.5*  HCT 32.4*  --  31.1* 28.1*  PLT 193  --  168 135*  CREATININE 0.74 0.61 0.93 0.94  ALBUMIN 3.8  --  3.6 3.3*  PROT 6.0*  --  5.4* 5.1*  AST 57*  --  37 23  ALT 45  --  45 31  ALKPHOS 58  --  49 43  BILITOT 2.0*  --  0.8 0.9   Estimated Creatinine Clearance: 59.1 mL/min (by C-G formula based on SCr of 0.94 mg/dL).    Assessment: 60 yo female to start on high dose methotrexate infusion   Plan:  Per Dr. Rogue Bussing, do not start the methotrexate infusion until urine pH is at 7.0; NaBicarb drip to continue at least 48h after MTX infusion. Per MD, check urine pH at least daily.    Pt needs MTX level ordered at 24h after start of infusion (i.e. 20 h after the end of infusion).  Based on MTX level, leucovorin dose will need to be ordered and adjusted based on the daily MTX level. Pt will need daily MTX levels and urine pH monitoring until MTX levels are <0.1 microM.  Pharmacy to follow up MTX level and dose leucovorin tomorrow.   If urine pH less than goal consider increasing NaBicarb drip by 25 ml/hr, or if rate exceeds 175 ml/hr on the current NaBicarb drip, may need to increase bicarb in bag to 150 mEq. Checking urine pH every two hours until pH >7.0. Care order  instruction for RN to enter urine pH orders.  7/24: Urine pH = 7.  MTX level ordered to be drawn  @ 17:00 on 7/25 which will be 24 hours post initiation of MTX.   7/25 Patient's urine pH is being maintained above 7.0 consistantly with current bicarb drip. Spoke with RN and advised her that the pH checks can be cut back to q4h. Methotrexate level to be drawn at 17:00 and will begin leucovorin 18mg  (10mg /m2) IV q6h at 17:00. Will follow up on Methotrexate level and adjust leucovorin dose accordingly.  7/25 17:00 MTX level 1.74 micromol/L. Appears to be normally eliminating. Continue current leucovorin regimen.   7/26 1700 MTX level: 0.12micromol/L. Renal function stable. Continue current leucovorin regimen.   7/27 1700 MTX level 0.11. Continue current leucovorin regimen. Will recheck methotrexate level 7/28 at 1700 and adjust as indicated. Plan to continue leucovorin until methotrexate level < 0.76micromol/L  Rexene Edison, PharmD Clinical Pharmacist 12/09/2015 4:33 AM

## 2015-12-09 NOTE — Progress Notes (Signed)
Stacy Murillo   DOB:Nov 28, 1955   [id number]    Subjective: No nausea no vomiting. No fevers. She feels overall improved overnight. No sores in the mouth.  No fevers. No chills. No headaches.  ROS: No diarrhea. No shortness of breath. Patient has been walking the hallways.  Objective:  Vitals:   12/08/15 2045 12/09/15 0408  BP: 132/72 (!) 131/56  Pulse: 75 64  Resp: 18   Temp: 97.8 F (36.6 C) 97.4 F (36.3 C)     Intake/Output Summary (Last 24 hours) at 12/09/15 0802 Last data filed at 12/09/15 0454  Gross per 24 hour  Intake                0 ml  Output             4800 ml  Net            -4800 ml    GENERAL:alert, no distress and comfortable SKIN: skin color, texture, turgor are normal, no rashes or significant lesions EYES: normal, Conjunctiva are pink and non-injected, sclera clear OROPHARYNX:no exudate, no erythema and lips, buccal mucosa, and tongue normal  NECK: supple, thyroid normal size, non-tender, without nodularity LYMPH:  no palpable lymphadenopathy in the cervical, axillary or inguinal LUNGS: clear to auscultation and percussion with normal breathing effort HEART: regular rate & rhythm and no murmurs and no lower extremity edema ABDOMEN:abdomen soft, non-tender and normal bowel sounds Musculoskeletal:no cyanosis of digits and no clubbing  NEURO: alert & oriented x 3 with fluent speech, no focal motor/sensory deficits   Labs:  Lab Results  Component Value Date   WBC 3.5 (L) 12/09/2015   HGB 9.3 (L) 12/09/2015   HCT 28.4 (L) 12/09/2015   MCV 93.3 12/09/2015   PLT 141 (L) 12/09/2015   NEUTROABS 2.8 12/09/2015    Lab Results  Component Value Date   NA 141 12/09/2015   K 3.7 12/09/2015   CL 103 12/09/2015   CO2 33 (H) 12/09/2015    Studies:  No results found.  Assessment & Plan:   # Diffuse large B-cell lymphoma- stage IV- currently admitted to the hospital for high-dose methotrexate.  # CNS prophylaxis- with high-dose methotrexate [3.5  g/m]; day #5. Patient's methotrexate levels- 1.64 [day#2]; D#3- MXT- 0.22;day # 4- 0.11 Cont as per the nomogram/ pharmacy was continued leucovorin grams every 6 hours.  Continue leucovorin rescue until levels < 0.05; pharmacy following.   # Hypokalemia sec to Bicarb infusion- will continue potassium supplementation 20 meq 3 times a day  # Mild pancytopenia-likely secondary to chemotherapy. Monitor for now  # Diffuse large B-cell lymphoma status post 3 cycles of R CHOP- excellent response to chemotherapy. We will likely have to push the chemotherapy 1 week.   # Recent DVT Eliquis continue the same.  # will likely discharge tomorrow if MXt levels- < 0.05.    Cammie Sickle, MD 12/09/2015  8:02 AM

## 2015-12-10 LAB — CBC WITH DIFFERENTIAL/PLATELET
BASOS ABS: 0 10*3/uL (ref 0–0.1)
BASOS PCT: 1 %
EOS PCT: 1 %
Eosinophils Absolute: 0 10*3/uL (ref 0–0.7)
HCT: 26.2 % — ABNORMAL LOW (ref 35.0–47.0)
Hemoglobin: 9.3 g/dL — ABNORMAL LOW (ref 12.0–16.0)
LYMPHS PCT: 13 %
Lymphs Abs: 0.5 10*3/uL — ABNORMAL LOW (ref 1.0–3.6)
MCH: 32 pg (ref 26.0–34.0)
MCHC: 35.6 g/dL (ref 32.0–36.0)
MCV: 90.1 fL (ref 80.0–100.0)
MONO ABS: 0.2 10*3/uL (ref 0.2–0.9)
MONOS PCT: 5 %
Neutro Abs: 3 10*3/uL (ref 1.4–6.5)
Neutrophils Relative %: 80 %
PLATELETS: 141 10*3/uL — AB (ref 150–440)
RBC: 2.9 MIL/uL — ABNORMAL LOW (ref 3.80–5.20)
RDW: 16.8 % — AB (ref 11.5–14.5)
WBC: 3.7 10*3/uL (ref 3.6–11.0)

## 2015-12-10 LAB — COMPREHENSIVE METABOLIC PANEL
ALBUMIN: 3.4 g/dL — AB (ref 3.5–5.0)
ALK PHOS: 50 U/L (ref 38–126)
ALT: 32 U/L (ref 14–54)
AST: 27 U/L (ref 15–41)
Anion gap: 6 (ref 5–15)
BILIRUBIN TOTAL: 1.1 mg/dL (ref 0.3–1.2)
BUN: 14 mg/dL (ref 6–20)
CALCIUM: 8.9 mg/dL (ref 8.9–10.3)
CO2: 32 mmol/L (ref 22–32)
Chloride: 104 mmol/L (ref 101–111)
Creatinine, Ser: 0.87 mg/dL (ref 0.44–1.00)
GFR calc Af Amer: >60 mL/min (ref >60)
GFR calc non Af Amer: >60 mL/min (ref >60)
GLUCOSE: 92 mg/dL (ref 65–99)
Potassium: 3.6 mmol/L (ref 3.5–5.1)
Sodium: 142 mmol/L (ref 135–145)
TOTAL PROTEIN: 5.4 g/dL — AB (ref 6.5–8.1)

## 2015-12-10 LAB — URINE PH
pH: 9 — ABNORMAL HIGH (ref 5.0–8.0)
pH: 9 — ABNORMAL HIGH (ref 5.0–8.0)

## 2015-12-10 MED ORDER — HEPARIN SOD (PORK) LOCK FLUSH 100 UNIT/ML IV SOLN
500.0000 [IU] | Freq: Once | INTRAVENOUS | Status: AC
  Administered 2015-12-10: 13:00:00 500 [IU] via INTRAVENOUS

## 2015-12-10 NOTE — Progress Notes (Signed)
MEDICATION RELATED CONSULT NOTE - INITIAL   Pharmacy Consult for Methotrexate monitoring Indication: Monitoring of methotrexate levels and ordering leucovorin  No Known Allergies  Patient Measurements: Height: 5\' 2"  (157.5 cm) Weight: 163 lb (73.9 kg) IBW/kg (Calculated) : 50.1   Vital Signs: Temp: 97.8 F (36.6 C) (07/29 0506) Temp Source: Oral (07/29 0506) BP: 148/74 (07/29 0506) Pulse Rate: 77 (07/29 0506) Intake/Output from previous day: 07/28 0701 - 07/29 0700 In: -  Out: 4275 [Urine:4275] Intake/Output from this shift: No intake/output data recorded.  Labs:  Recent Labs  12/08/15 0516 12/09/15 0438 12/10/15 0522  WBC 3.2* 3.5* 3.7  HGB 9.5* 9.3* 9.3*  HCT 28.1* 28.4* 26.2*  PLT 135* 141* 141*  CREATININE 0.94 0.97 0.87  ALBUMIN 3.3* 3.3* 3.4*  PROT 5.1* 5.3* 5.4*  AST 23 25 27   ALT 31 30 32  ALKPHOS 43 46 50  BILITOT 0.9 1.0 1.1   Estimated Creatinine Clearance: 63.9 mL/min (by C-G formula based on SCr of 0.87 mg/dL).    Assessment: 60 yo female to start on high dose methotrexate infusion   Plan:  Per Dr. Rogue Bussing, do not start the methotrexate infusion until urine pH is at 7.0; NaBicarb drip to continue at least 48h after MTX infusion. Per MD, check urine pH at least daily.    Pt needs MTX level ordered at 24h after start of infusion (i.e. 20 h after the end of infusion).  Based on MTX level, leucovorin dose will need to be ordered and adjusted based on the daily MTX level. Pt will need daily MTX levels and urine pH monitoring until MTX levels are <0.1 microM.  Pharmacy to follow up MTX level and dose leucovorin tomorrow.   If urine pH less than goal consider increasing NaBicarb drip by 25 ml/hr, or if rate exceeds 175 ml/hr on the current NaBicarb drip, may need to increase bicarb in bag to 150 mEq. Checking urine pH every two hours until pH >7.0. Care order instruction for RN to enter urine pH orders.  7/24: Urine pH = 7.  MTX level ordered to  be drawn  @ 17:00 on 7/25 which will be 24 hours post initiation of MTX.   7/25 Patient's urine pH is being maintained above 7.0 consistantly with current bicarb drip. Spoke with RN and advised her that the pH checks can be cut back to q4h. Methotrexate level to be drawn at 17:00 and will begin leucovorin 18mg  (10mg /m2) IV q6h at 17:00. Will follow up on Methotrexate level and adjust leucovorin dose accordingly.  7/25 17:00 MTX level 1.74 micromol/L. Appears to be normally eliminating. Continue current leucovorin regimen.   7/26 1700 MTX level: 0.71micromol/L. Renal function stable. Continue current leucovorin regimen.   7/27 1700 MTX level 0.11. Continue current leucovorin regimen. Will recheck methotrexate level 7/28 at 1700 and adjust as indicated. Plan to continue leucovorin until methotrexate level < 0.11micromol/L  7/28 1700 MTX level 0.06. Continue current orders per MD. Lab ordered for today at 17:00.  Rexene Edison, PharmD Clinical Pharmacist 12/10/2015 7:08 AM

## 2015-12-10 NOTE — Progress Notes (Signed)
MD order received to discharge pt home today; verbally reviewed AVS with pt, no questions voiced at this time; pt ambulated to the visitor's entrance with nursing

## 2015-12-10 NOTE — Progress Notes (Signed)
Stacy Murillo   DOB:16-Oct-1955   [id number]    Subjective: Patient feels at her baseline today. Anxious to be discharged.    ROS: No diarrhea. No shortness of breath. Patient has been walking the hallways.  Objective:  Vitals:   12/09/15 2129 12/10/15 0506  BP: 125/81 (!) 148/74  Pulse: 72 77  Resp: 20 20  Temp: 98.8 F (37.1 C) 97.8 F (36.6 C)     Intake/Output Summary (Last 24 hours) at 12/10/15 1248 Last data filed at 12/10/15 1100  Gross per 24 hour  Intake                0 ml  Output             4725 ml  Net            -4725 ml    GENERAL:alert, no distress and comfortable SKIN: skin color, texture, turgor are normal, no rashes or significant lesions EYES: normal, Conjunctiva are pink and non-injected, sclera clear OROPHARYNX:no exudate, no erythema and lips, buccal mucosa, and tongue normal  NECK: supple, thyroid normal size, non-tender, without nodularity LYMPH:  no palpable lymphadenopathy in the cervical, axillary or inguinal LUNGS: clear to auscultation and percussion with normal breathing effort HEART: regular rate & rhythm and no murmurs and no lower extremity edema ABDOMEN:abdomen soft, non-tender and normal bowel sounds Musculoskeletal:no cyanosis of digits and no clubbing  NEURO: alert & oriented x 3 with fluent speech, no focal motor/sensory deficits   Labs:  Lab Results  Component Value Date   WBC 3.7 12/10/2015   HGB 9.3 (L) 12/10/2015   HCT 26.2 (L) 12/10/2015   MCV 90.1 12/10/2015   PLT 141 (L) 12/10/2015   NEUTROABS 3.0 12/10/2015    Lab Results  Component Value Date   NA 142 12/10/2015   K 3.6 12/10/2015   CL 104 12/10/2015   CO2 32 12/10/2015    Studies:  No results found.  Assessment & Plan:   # Diffuse large B-cell lymphoma- stage IV- currently admitted to the hospital for high-dose methotrexate.  # CNS prophylaxis- with high-dose methotrexate [3.5 g/m]; day #5. Patient's methotrexate levels- 1.64 [day#2]; D#3- MXT- 0.22;day  # 4- 0.11 Day #5- 0.06. Patient received one final dose of leucovorin rescue this morning. Discharge home today with follow-up on Monday at 9 AM for laboratory work and then in 9:15 with Dr. Yevette Edwards.   # Hypokalemia sec to Bicarb infusion- patient's potassium levels are now within normal limits.  # Mild pancytopenia-resolved except for a mild anemia. Monitor.  # Diffuse large B-cell lymphoma status post 3 cycles of R CHOP- excellent response to chemotherapy. We will likely have to push the chemotherapy 1 week.   # Recent DVT Eliquis continue the same.  Discharge home today with follow-up as above.   Lloyd Huger, MD 12/10/2015  12:48 PM

## 2015-12-12 ENCOUNTER — Inpatient Hospital Stay (HOSPITAL_BASED_OUTPATIENT_CLINIC_OR_DEPARTMENT_OTHER): Payer: BLUE CROSS/BLUE SHIELD | Admitting: Internal Medicine

## 2015-12-12 ENCOUNTER — Inpatient Hospital Stay: Payer: BLUE CROSS/BLUE SHIELD

## 2015-12-12 VITALS — BP 139/83 | HR 71 | Temp 99.1°F | Resp 18 | Wt 163.0 lb

## 2015-12-12 DIAGNOSIS — E669 Obesity, unspecified: Secondary | ICD-10-CM | POA: Diagnosis not present

## 2015-12-12 DIAGNOSIS — Z9221 Personal history of antineoplastic chemotherapy: Secondary | ICD-10-CM | POA: Diagnosis not present

## 2015-12-12 DIAGNOSIS — Z86718 Personal history of other venous thrombosis and embolism: Secondary | ICD-10-CM | POA: Diagnosis not present

## 2015-12-12 DIAGNOSIS — C8333 Diffuse large B-cell lymphoma, intra-abdominal lymph nodes: Secondary | ICD-10-CM

## 2015-12-12 DIAGNOSIS — R7989 Other specified abnormal findings of blood chemistry: Secondary | ICD-10-CM | POA: Diagnosis not present

## 2015-12-12 DIAGNOSIS — Z79899 Other long term (current) drug therapy: Secondary | ICD-10-CM

## 2015-12-12 DIAGNOSIS — K5790 Diverticulosis of intestine, part unspecified, without perforation or abscess without bleeding: Secondary | ICD-10-CM | POA: Diagnosis not present

## 2015-12-12 DIAGNOSIS — C8583 Other specified types of non-Hodgkin lymphoma, intra-abdominal lymph nodes: Secondary | ICD-10-CM

## 2015-12-12 DIAGNOSIS — M818 Other osteoporosis without current pathological fracture: Secondary | ICD-10-CM

## 2015-12-12 DIAGNOSIS — R079 Chest pain, unspecified: Secondary | ICD-10-CM | POA: Diagnosis not present

## 2015-12-12 DIAGNOSIS — D696 Thrombocytopenia, unspecified: Secondary | ICD-10-CM | POA: Diagnosis not present

## 2015-12-12 DIAGNOSIS — Z800 Family history of malignant neoplasm of digestive organs: Secondary | ICD-10-CM | POA: Diagnosis not present

## 2015-12-12 DIAGNOSIS — R000 Tachycardia, unspecified: Secondary | ICD-10-CM

## 2015-12-12 DIAGNOSIS — R61 Generalized hyperhidrosis: Secondary | ICD-10-CM | POA: Diagnosis not present

## 2015-12-12 DIAGNOSIS — K219 Gastro-esophageal reflux disease without esophagitis: Secondary | ICD-10-CM | POA: Diagnosis not present

## 2015-12-12 DIAGNOSIS — K209 Esophagitis, unspecified: Secondary | ICD-10-CM

## 2015-12-12 DIAGNOSIS — R5383 Other fatigue: Secondary | ICD-10-CM | POA: Diagnosis not present

## 2015-12-12 DIAGNOSIS — Z8719 Personal history of other diseases of the digestive system: Secondary | ICD-10-CM | POA: Diagnosis not present

## 2015-12-12 LAB — COMPREHENSIVE METABOLIC PANEL
ALT: 85 U/L — ABNORMAL HIGH (ref 14–54)
ANION GAP: 7 (ref 5–15)
AST: 49 U/L — ABNORMAL HIGH (ref 15–41)
Albumin: 4.3 g/dL (ref 3.5–5.0)
Alkaline Phosphatase: 73 U/L (ref 38–126)
BUN: 17 mg/dL (ref 6–20)
CALCIUM: 9.1 mg/dL (ref 8.9–10.3)
CHLORIDE: 107 mmol/L (ref 101–111)
CO2: 26 mmol/L (ref 22–32)
Creatinine, Ser: 0.94 mg/dL (ref 0.44–1.00)
GFR calc non Af Amer: >60 mL/min (ref >60)
Glucose, Bld: 114 mg/dL — ABNORMAL HIGH (ref 65–99)
POTASSIUM: 3.8 mmol/L (ref 3.5–5.1)
SODIUM: 140 mmol/L (ref 135–145)
Total Bilirubin: 0.9 mg/dL (ref 0.3–1.2)
Total Protein: 6.6 g/dL (ref 6.5–8.1)

## 2015-12-12 LAB — CBC WITH DIFFERENTIAL/PLATELET
BASOS PCT: 1 %
Basophils Absolute: 0 10*3/uL (ref 0–0.1)
EOS ABS: 0.1 10*3/uL (ref 0–0.7)
Eosinophils Relative: 2 %
HCT: 29.9 % — ABNORMAL LOW (ref 35.0–47.0)
Hemoglobin: 10.6 g/dL — ABNORMAL LOW (ref 12.0–16.0)
LYMPHS PCT: 12 %
Lymphs Abs: 0.4 10*3/uL — ABNORMAL LOW (ref 1.0–3.6)
MCH: 32 pg (ref 26.0–34.0)
MCHC: 35.4 g/dL (ref 32.0–36.0)
MCV: 90.4 fL (ref 80.0–100.0)
MONO ABS: 0.5 10*3/uL (ref 0.2–0.9)
Monocytes Relative: 15 %
NEUTROS ABS: 2.3 10*3/uL (ref 1.4–6.5)
NEUTROS PCT: 70 %
PLATELETS: 145 10*3/uL — AB (ref 150–440)
RBC: 3.31 MIL/uL — ABNORMAL LOW (ref 3.80–5.20)
RDW: 16.4 % — AB (ref 11.5–14.5)
WBC: 3.3 10*3/uL — AB (ref 3.6–11.0)

## 2015-12-12 NOTE — Assessment & Plan Note (Signed)
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 PET- significant response noted. S/p 3 cycles; s/p High dose MXT [july 24th].   # proceed with # 4 cycle of R-CHOP on aug 3rd. Will check labs again  # mild AST/ALT elevation- monitor for now.  # will plan high dose MXT alternate cycles.  #  Left lower extremity DVT- improved on Eliquis.  # R-chop # 4 on aug 3rd; weekly labs/ follow up with me in Aug 24th/Rchop/MD

## 2015-12-12 NOTE — Progress Notes (Signed)
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)   Staging form: Lymphoid Neoplasms, AJCC 6th Edition     Clinical stage from 09/13/2015: Stage IV - Signed by Cammie Sickle, MD on 10/06/2015    Oncology History   #MAY 2017- LARGE B CELL LYMPHOMA with intravascular features STAGE IV- [BMBx- hypercellular- lymphoproliferative process is mostly seen within small vessels in the bone marrow as well as the surrounding interstitium associated with circulating lymphoma cells in the peripheral blood; ]. CT- 1-2 CM LN subpectoral/medistinal/ retro-peritoneal/pelvic/ Right inguinal LN 1.6cm- Bx- DLBCL- ABC; myc-POS; FISH gene re-arragement-NEG. PET- MULTIPLE LN/ Bone involvement; May 25th- R-CHOP q3 W x2; PET- Excellent Response.   # Lumbar puncture- difficult-spinal headache.   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  Patient was admitted to the hospital for high-dose methotrexate; discharged 3 days ago. Stay was uneventful. Patient tolerated the treatment extremely well.  Patient is feeling well- No chest pain or shortness of breath or cough.. Denies any nausea or vomiting. Denies any headaches. Denies any sores anymore. the legs. No bleeding. No nausea no vomiting.  Denies  any significant headaches. Appetite is good. Denies any tingling and numbness.  REVIEW OF SYSTEMS:  A complete 10 point review of system is done which is negative except mentioned above/history of present illness.   PAST MEDICAL HISTORY :  Past Medical History:  Diagnosis Date  . Arthritis   . Blood dyscrasia   . Cancer ([place])   . Chest pain, unspecified   . Diverticulosis 07/06/15  . Dysrhythmia   . Esophagitis 07/06/15  . Fatigue   . Gastritis 07/06/15  . GERD (gastroesophageal reflux disease)   . 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 Leg cramps   . Lymphoma ([place]) 09/14/15   Monoclonal B cell lymphoma  . Night sweats   . Osteoporosis   . Overweight(278.02)    Obesity  . Palpitations   . Shortness of breath dyspnea   . Tachycardia   . Thrombocytopenia ([place])     PAST SURGICAL HISTORY :   Past Surgical History:  Procedure Laterality Date  . ABDOMINAL HYSTERECTOMY  2005  . BONE MARROW BIOPSY  09/14/15  . CHOLECYSTECTOMY  1997  . COLONOSCOPY  07/05/2014  . ESOPHAGOGASTRODUODENOSCOPY  07/05/2014  . INGUINAL LYMPH NODE BIOPSY Right 10/05/2015   Procedure: INGUINAL LYMPH NODE BIOPSY;  Surgeon: Christene Lye, MD;  Location: [facility] ORS;  Service: General;  Laterality: Right;  . PERIPHERAL VASCULAR CATHETERIZATION N/A 09/27/2015   Procedure: Glori Luis Cath Insertion;  Surgeon: Algernon Huxley, MD;  Location: [facility];  Service: Cardiovascular;  Laterality: N/A;  . PORTACATH PLACEMENT Right     FAMILY HISTORY :   Family History  Problem Relation Age of Onset  . Diabetes Neg Hx   . Heart  disease Father     CABG x 4  . Hypertension Mother   . Pancreatic cancer Mother   . Ulcers Mother   . Asthma Mother     SOCIAL HISTORY:   Social History  Substance Use Topics  . Smoking status: Never Smoker  . Smokeless tobacco: Never Used  . Alcohol use No    ALLERGIES:  has No Known Allergies.  MEDICATIONS:  Current Outpatient Prescriptions  Medication Sig Dispense Refill  .  acyclovir (ZOVIRAX) 400 MG tablet Take 1 tablet (400 mg total) by mouth 2 (two) times daily. 60 tablet 3  . apixaban (ELIQUIS) 5 MG TABS tablet Take 1 tablet (5 mg total) by mouth 2 (two) times daily. From 10/27/15 60 tablet 3  . cyclobenzaprine (FLEXERIL) 5 MG tablet Take 1 tablet (5 mg total) by mouth 3 (three) times daily as needed for muscle spasms. 30 tablet 0  . diazepam (VALIUM) 5 MG tablet Take 5 mg by mouth every 8 (eight) hours as needed (for vertigo).    Marland Kitchen lidocaine-prilocaine (EMLA) cream Apply 1 application topically as needed (prior to accessing port).    . ondansetron (ZOFRAN) 8 MG tablet Take 8 mg by mouth every 8 (eight) hours as needed for nausea or vomiting.    Marland Kitchen oxyCODONE-acetaminophen (PERCOCET) 7.5-325 MG tablet Take 1 tablet by mouth every 6 (six) hours as needed for severe pain. 60 tablet 0  . prochlorperazine (COMPAZINE) 10 MG tablet Take 1 tablet (10 mg total) by mouth every 6 (six) hours as needed for nausea or vomiting. 40 tablet 1  . ranitidine (ZANTAC) 150 MG tablet Take 150 mg by mouth 2 (two) times daily.      No current facility-administered medications for this visit.    Facility-Administered Medications Ordered in Other Visits  Medication Dose Route Frequency Provider Last Rate Last Dose  . 0.9 %  sodium chloride infusion   Intravenous Continuous Evlyn Kanner, NP   Stopped at 10/14/15 1312    PHYSICAL EXAMINATION: ECOG PERFORMANCE STATUS: 1 - Symptomatic but completely ambulatory  BP 139/83 (Patient Position: Sitting)   Pulse 71   Temp 99.1 F (37.3 C) (Tympanic)   Resp 18   Wt 163 lb (73.9 kg)   BMI 29.81 kg/m   Filed Weights   12/12/15 1009  Weight: 163 lb (73.9 kg)    GENERAL: Well-nourished well-developed; Alert, no distress and comfortable.   Accompanied by her husband.  EYES: no pallor or icterus OROPHARYNX: no thrush; good dentition  NECK: supple, no masses felt LYMPH:  no palpable lymphadenopathy in the cervical, axillary or inguinal  regions LUNGS: clear to auscultation and  No wheeze or crackles HEART/CVS: regular rate & rhythm and no murmurs; No lower extremity edema. ABDOMEN:abdomen soft, non-tender and normal bowel sounds Musculoskeletal:no cyanosis of digits and no clubbing  PSYCH: alert & oriented x 3 with fluent speech NEURO: no focal motor/sensory deficits SKIN:  no rashes or significant lesions;  LABORATORY DATA:  I have reviewed the data as listed    Component Value Date/Time   NA 140 12/12/2015 0915   NA 139 06/29/2010 0749   K 3.8 12/12/2015 0915   CL 107 12/12/2015 0915   CO2 26 12/12/2015 0915   GLUCOSE 114 (H) 12/12/2015 0915   BUN 17 12/12/2015 0915   BUN 16 06/29/2010 0749   CREATININE 0.94 12/12/2015 0915   CALCIUM 9.1 12/12/2015 0915   PROT 6.6 12/12/2015 0915   ALBUMIN 4.3 12/12/2015 0915   AST 49 (  H) 12/12/2015 0915   ALT 85 (H) 12/12/2015 0915   ALKPHOS 73 12/12/2015 0915   BILITOT 0.9 12/12/2015 0915   GFRNONAA >60 12/12/2015 0915   GFRAA >60 12/12/2015 0915    No results found for: SPEP, UPEP  Lab Results  Component Value Date   WBC 3.3 (L) 12/12/2015   NEUTROABS 2.3 12/12/2015   HGB 10.6 (L) 12/12/2015   HCT 29.9 (L) 12/12/2015   MCV 90.4 12/12/2015   PLT 145 (L) 12/12/2015      Chemistry      Component Value Date/Time   NA 140 12/12/2015 0915   NA 139 06/29/2010 0749   K 3.8 12/12/2015 0915   CL 107 12/12/2015 0915   CO2 26 12/12/2015 0915   BUN 17 12/12/2015 0915   BUN 16 06/29/2010 0749   CREATININE 0.94 12/12/2015 0915   GLU 93 06/29/2010 0749      Component Value Date/Time   CALCIUM 9.1 12/12/2015 0915   ALKPHOS 73 12/12/2015 0915   AST 49 (H) 12/12/2015 0915   ALT 85 (H) 12/12/2015 0915   BILITOT 0.9 12/12/2015 0915       RADIOGRAPHIC STUDIES: I have personally reviewed the radiological images as listed and agreed with the findings in the report. No results found.   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 PET- significant response noted. S/p 3 cycles; s/p High dose MXT [july 24th].   # proceed with # 4 cycle of R-CHOP on aug 3rd. Will check labs again  # mild AST/ALT elevation- monitor for now.  # will plan high dose MXT alternate cycles.  #  Left lower extremity DVT- improved on Eliquis.  # R-chop # 4 on aug 3rd; weekly labs/ follow up with me in Aug 24th/Rchop/MD   Orders Placed This Encounter  Procedures  . CBC with Differential    Standing Status:   Standing    Number of Occurrences:   4    Standing Expiration Date:   12/11/2016  . Comprehensive metabolic panel    Standing Status:   Standing    Number of Occurrences:   4    Standing Expiration Date:   12/11/2016   All questions were answered. The patient knows to call the clinic with any problems, questions or concerns.      Cammie Sickle, MD 12/14/2015 6:31 PM

## 2015-12-13 ENCOUNTER — Encounter: Payer: Self-pay | Admitting: Internal Medicine

## 2015-12-14 ENCOUNTER — Other Ambulatory Visit: Payer: Self-pay | Admitting: *Deleted

## 2015-12-14 DIAGNOSIS — C8583 Other specified types of non-Hodgkin lymphoma, intra-abdominal lymph nodes: Secondary | ICD-10-CM

## 2015-12-15 ENCOUNTER — Inpatient Hospital Stay: Payer: BLUE CROSS/BLUE SHIELD

## 2015-12-15 ENCOUNTER — Inpatient Hospital Stay: Payer: BLUE CROSS/BLUE SHIELD | Attending: Internal Medicine

## 2015-12-15 VITALS — BP 125/72 | HR 79 | Temp 96.6°F | Resp 18

## 2015-12-15 DIAGNOSIS — C8583 Other specified types of non-Hodgkin lymphoma, intra-abdominal lymph nodes: Secondary | ICD-10-CM

## 2015-12-15 DIAGNOSIS — D696 Thrombocytopenia, unspecified: Secondary | ICD-10-CM | POA: Insufficient documentation

## 2015-12-15 DIAGNOSIS — R079 Chest pain, unspecified: Secondary | ICD-10-CM | POA: Insufficient documentation

## 2015-12-15 DIAGNOSIS — M818 Other osteoporosis without current pathological fracture: Secondary | ICD-10-CM | POA: Insufficient documentation

## 2015-12-15 DIAGNOSIS — R000 Tachycardia, unspecified: Secondary | ICD-10-CM | POA: Diagnosis not present

## 2015-12-15 DIAGNOSIS — Z5111 Encounter for antineoplastic chemotherapy: Secondary | ICD-10-CM | POA: Insufficient documentation

## 2015-12-15 DIAGNOSIS — Z79899 Other long term (current) drug therapy: Secondary | ICD-10-CM | POA: Insufficient documentation

## 2015-12-15 DIAGNOSIS — C8333 Diffuse large B-cell lymphoma, intra-abdominal lymph nodes: Secondary | ICD-10-CM | POA: Diagnosis not present

## 2015-12-15 DIAGNOSIS — Z86718 Personal history of other venous thrombosis and embolism: Secondary | ICD-10-CM

## 2015-12-15 DIAGNOSIS — E669 Obesity, unspecified: Secondary | ICD-10-CM | POA: Insufficient documentation

## 2015-12-15 LAB — CBC WITH DIFFERENTIAL/PLATELET
BASOS ABS: 0 10*3/uL (ref 0–0.1)
BASOS PCT: 0 %
EOS ABS: 0 10*3/uL (ref 0–0.7)
Eosinophils Relative: 0 %
HCT: 29.4 % — ABNORMAL LOW (ref 35.0–47.0)
HEMOGLOBIN: 10.5 g/dL — AB (ref 12.0–16.0)
Lymphocytes Relative: 3 %
Lymphs Abs: 0.2 10*3/uL — ABNORMAL LOW (ref 1.0–3.6)
MCH: 32.2 pg (ref 26.0–34.0)
MCHC: 35.7 g/dL (ref 32.0–36.0)
MCV: 90.3 fL (ref 80.0–100.0)
Monocytes Absolute: 0.1 10*3/uL — ABNORMAL LOW (ref 0.2–0.9)
Monocytes Relative: 1 %
NEUTROS ABS: 5.6 10*3/uL (ref 1.4–6.5)
NEUTROS PCT: 96 %
Platelets: 160 10*3/uL (ref 150–440)
RBC: 3.26 MIL/uL — AB (ref 3.80–5.20)
RDW: 16.8 % — ABNORMAL HIGH (ref 11.5–14.5)
WBC: 5.9 10*3/uL (ref 3.6–11.0)

## 2015-12-15 LAB — COMPREHENSIVE METABOLIC PANEL
ALBUMIN: 4.3 g/dL (ref 3.5–5.0)
ALK PHOS: 62 U/L (ref 38–126)
ALT: 40 U/L (ref 14–54)
AST: 25 U/L (ref 15–41)
Anion gap: 7 (ref 5–15)
BILIRUBIN TOTAL: 0.8 mg/dL (ref 0.3–1.2)
BUN: 18 mg/dL (ref 6–20)
CALCIUM: 9.1 mg/dL (ref 8.9–10.3)
CO2: 25 mmol/L (ref 22–32)
CREATININE: 0.87 mg/dL (ref 0.44–1.00)
Chloride: 109 mmol/L (ref 101–111)
GFR calc Af Amer: >60 mL/min (ref >60)
GFR calc non Af Amer: >60 mL/min (ref >60)
GLUCOSE: 111 mg/dL — AB (ref 65–99)
Potassium: 3.8 mmol/L (ref 3.5–5.1)
SODIUM: 141 mmol/L (ref 135–145)
Total Protein: 6.5 g/dL (ref 6.5–8.1)

## 2015-12-15 MED ORDER — SODIUM CHLORIDE 0.9 % IV SOLN
1400.0000 mg | Freq: Once | INTRAVENOUS | Status: AC
  Administered 2015-12-15: 1400 mg via INTRAVENOUS
  Filled 2015-12-15: qty 50

## 2015-12-15 MED ORDER — SODIUM CHLORIDE 0.9 % IV SOLN: 375.0000 mg/m2 | Freq: Once | INTRAVENOUS | Status: DC

## 2015-12-15 MED ORDER — DOXORUBICIN HCL CHEMO IV INJECTION 2 MG/ML
50.0000 mg/m2 | Freq: Once | INTRAVENOUS | Status: AC
  Administered 2015-12-15: 92 mg via INTRAVENOUS
  Filled 2015-12-15: qty 46

## 2015-12-15 MED ORDER — HEPARIN SOD (PORK) LOCK FLUSH 100 UNIT/ML IV SOLN
500.0000 [IU] | Freq: Once | INTRAVENOUS | Status: AC | PRN
  Administered 2015-12-15: 500 [IU]
  Filled 2015-12-15: qty 5

## 2015-12-15 MED ORDER — DIPHENHYDRAMINE HCL 25 MG PO CAPS
50.0000 mg | ORAL_CAPSULE | Freq: Once | ORAL | Status: AC
  Administered 2015-12-15: 50 mg via ORAL
  Filled 2015-12-15: qty 2

## 2015-12-15 MED ORDER — SODIUM CHLORIDE 0.9 % IV SOLN
375.0000 mg/m2 | Freq: Once | INTRAVENOUS | Status: AC
  Administered 2015-12-15: 700 mg via INTRAVENOUS
  Filled 2015-12-15: qty 50

## 2015-12-15 MED ORDER — ACETAMINOPHEN 325 MG PO TABS
650.0000 mg | ORAL_TABLET | Freq: Once | ORAL | Status: AC
  Administered 2015-12-15: 650 mg via ORAL
  Filled 2015-12-15: qty 2

## 2015-12-15 MED ORDER — PEGFILGRASTIM 6 MG/0.6ML ~~LOC~~ PSKT
6.0000 mg | PREFILLED_SYRINGE | Freq: Once | SUBCUTANEOUS | Status: AC
  Administered 2015-12-15: 6 mg via SUBCUTANEOUS
  Filled 2015-12-15: qty 0.6

## 2015-12-15 MED ORDER — SODIUM CHLORIDE 0.9% FLUSH
10.0000 mL | INTRAVENOUS | Status: DC | PRN
  Administered 2015-12-15: 10 mL
  Filled 2015-12-15: qty 10

## 2015-12-15 MED ORDER — SODIUM CHLORIDE 0.9 % IV SOLN
2.0000 mg | Freq: Once | INTRAVENOUS | Status: AC
  Administered 2015-12-15: 2 mg via INTRAVENOUS
  Filled 2015-12-15: qty 2

## 2015-12-15 MED ORDER — PALONOSETRON HCL INJECTION 0.25 MG/5ML
0.2500 mg | Freq: Once | INTRAVENOUS | Status: AC
  Administered 2015-12-15: 0.25 mg via INTRAVENOUS
  Filled 2015-12-15: qty 5

## 2015-12-15 MED ORDER — SODIUM CHLORIDE 0.9 % IV SOLN
10.0000 mg | Freq: Once | INTRAVENOUS | Status: AC
  Administered 2015-12-15: 10 mg via INTRAVENOUS
  Filled 2015-12-15: qty 1

## 2015-12-15 MED ORDER — SODIUM CHLORIDE 0.9 % IV SOLN
Freq: Once | INTRAVENOUS | Status: AC
  Administered 2015-12-15: 10:00:00 via INTRAVENOUS
  Filled 2015-12-15: qty 1000

## 2015-12-15 NOTE — Discharge Summary (Signed)
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 Arthritis   . Blood dyscrasia   . Cancer ([place])   . Chest pain, unspecified   . Diverticulosis 07/06/15  . Dysrhythmia   . Esophagitis 07/06/15  . Fatigue   . Gastritis 07/06/15  . GERD (gastroesophageal reflux disease)   . 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 Leg cramps   . Lymphoma ([place]) 09/14/15   Monoclonal B cell lymphoma  . Night sweats   . Osteoporosis   . Overweight(278.02)    Obesity  . Palpitations   . Shortness of breath dyspnea   . Tachycardia   . Thrombocytopenia [facility])     HOSPITAL COURSE:   HPI: A 60 year old female patient with history of diffuse large B-cell lymphoma- with involvement of the peripheral blood status post 3 cycles of R CHOP was admitted to the hospital for high-dose methotrexate for CNS prophylaxis.  # CNS prophylaxis- with high-dose methotrexate [3.5 g/m];  Patient's methotrexate levels- 1.64 [day#2]; D#3- MXT- 0.22;day # 4- 0.11 Cont as per the nomogram/ pharmacy was continued leucovorin grams every 6 hours.   patient's methotrexate declined appropriately.  # Hypokalemia sec to Bicarb infusion- /s/p potassium supplementation 20 meq 3 times a day  # Mild pancytopenia-likely secondary to chemotherapy.  # Diffuse large B-cell lymphoma status post 3 cycles of R CHOP- excellent response to chemotherapy. Plan R CHOP cycle #4 next week.  # Recent DVT Eliquis continue the same.    DISCHARGE CONDITIONS:   stable  CONSULTS OBTAINED:  none  DRUG ALLERGIES:  No Known Allergies  DISCHARGE MEDICATIONS:   Discharge Medication List as of 12/10/2015  1:31 PM    CONTINUE these medications which have NOT CHANGED   Details  acyclovir (ZOVIRAX) 400 MG tablet Take 1 tablet (400 mg total) by mouth 2 (two) times daily., Starting 10/24/2015, Until Discontinued, Normal    apixaban (ELIQUIS) 5 MG TABS tablet Take 1 tablet (5 mg total) by mouth 2 (two) times daily. From 10/27/15, Starting 10/27/2015, Until Discontinued, Normal    diazepam (VALIUM) 5 MG tablet Take 5 mg by mouth every 8 (eight) hours as needed (for vertigo)., Until Discontinued, Historical Med    lidocaine-prilocaine (EMLA) cream Apply 1 application topically as needed (prior to accessing port)., Until Discontinued, Historical Med    ondansetron (ZOFRAN) 8 MG tablet Take 8 mg by mouth every 8 (eight) hours as needed for nausea or vomiting., Until Discontinued, Historical Med    oxyCODONE-acetaminophen (PERCOCET) 7.5-325 MG tablet Take 1 tablet by mouth every 6 (six) hours as needed for severe pain., Starting 10/03/2015, Until Discontinued, Print    prochlorperazine (COMPAZINE) 10 MG tablet Take 1 tablet (10 mg total) by mouth every 6 (six) hours as needed for nausea or vomiting., Starting 10/03/2015, Until Discontinued, Normal    ranitidine (ZANTAC) 150 MG tablet Take 150 mg by mouth 2 (two) times daily. , Until Discontinued, Historical Med    cyclobenzaprine (FLEXERIL) 5 MG tablet Take 1 tablet (5 mg  total) by mouth 3 (three) times daily as needed for muscle spasms., Starting 10/27/2015, Until Discontinued, Normal         DISCHARGE INSTRUCTIONS:    DIET:  Regular diet  DISCHARGE CONDITION:  Good  ACTIVITY:  Activity as tolerated  OXYGEN:  Home Oxygen: No.   Oxygen Delivery: room air  DISCHARGE LOCATION:  home   If you experience worsening of your admission symptoms, develop shortness of breath, life threatening  emergency, suicidal or homicidal thoughts you must seek medical attention immediately by calling 911 or calling your MD immediately  if symptoms less severe.  You Must read complete instructions/literature along with all the possible adverse reactions/side effects for all the Medicines you take and that have been prescribed to you. Take any new Medicines after you have completely understood and accpet all the possible adverse reactions/side effects.      On the day of Discharge:  VITAL SIGNS:  Blood pressure (!) 148/74, pulse 77, temperature 97.8 F (36.6 C), temperature source Oral, resp. rate 20, height 5\' 2"  (1.575 m), weight 163 lb (73.9 kg), SpO2 98 %.  PHYSICAL EXAMINATION:  GENERAL:  60 y.o.-year-old patient lying in the bed with no acute distress.  EYES: Pupils equal, round, reactive to light and accommodation. No scleral icterus. Extraocular muscles intact.  HEENT: Head atraumatic, normocephalic. Oropharynx and nasopharynx clear.  NECK:  Supple, no jugular venous distention. No thyroid enlargement, no tenderness.  LUNGS: Normal breath sounds bilaterally, no wheezing, rales,rhonchi or crepitation. No use of accessory muscles of respiration.  CARDIOVASCULAR: S1, S2 normal. No murmurs, rubs, or gallops.  ABDOMEN: Soft, non-tender, non-distended. Bowel sounds present. No organomegaly or mass.  EXTREMITIES: No pedal edema, cyanosis, or clubbing.  NEUROLOGIC: Cranial nerves II through XII are intact. Muscle strength 5/5 in all extremities. Sensation intact. Gait not checked.  PSYCHIATRIC: The patient is alert and oriented x 3.  SKIN: No obvious rash, lesion, or ulcer.  DATA REVIEW:   CBC  Recent Labs Lab 12/15/15 0928  WBC 5.9  HGB 10.5*  HCT 29.4*  PLT 160    Chemistries   Recent Labs Lab 12/15/15 0928  NA 141  K 3.8  CL 109  CO2 25  GLUCOSE 111*  BUN 18  CREATININE 0.87  CALCIUM 9.1  AST 25  ALT 40  ALKPHOS 62  BILITOT 0.8    Cardiac Enzymes No results  for input(s): TROPONINI in the last 168 hours.  Microbiology Results  Results for orders placed or performed during the hospital encounter of 10/19/15  Urine culture     Status: Abnormal   Collection Time: 10/19/15 11:32 AM  Result Value Ref Range Status   Specimen Description URINE, RANDOM  Final   Special Requests NONE  Final   Culture MULTIPLE SPECIES PRESENT, SUGGEST RECOLLECTION (A)  Final   Report Status 10/20/2015 FINAL  Final  Blood culture (routine x 2)     Status: None   Collection Time: 10/19/15 11:32 AM  Result Value Ref Range Status   Specimen Description BLOOD RIGHT AC  Final   Special Requests BOTTLES DRAWN AEROBIC AND ANAEROBIC 14ML  Final   Culture NO GROWTH 5 DAYS  Final   Report Status 10/24/2015 FINAL  Final  Blood culture (routine x 2)     Status: None   Collection Time: 10/19/15 11:32 AM  Result Value Ref Range Status   Specimen Description BLOOD LEFT AC  Final   Special Requests   Final    BOTTLES DRAWN  AEROBIC AND ANAEROBIC AER 15ML ANA 13ML   Culture NO GROWTH 5 DAYS  Final   Report Status 10/24/2015 FINAL  Final    RADIOLOGY:  No results found.   Management plans discussed with the patient, family and they are in agreement.  CODE STATUS:  Code Status History    Date Active Date Inactive Code Status Order ID Comments User Context   12/05/2015 12:23 PM 12/10/2015  7:37 PM Full Code [id number]  Cammie Sickle, MD Inpatient   10/19/2015  9:33 PM 10/20/2015  4:54 PM Full Code [id number]  Fritzi Mandes, MD Inpatient    Advance Directive Documentation   Flowsheet Row Most Recent Value  Type of Advance Directive  Healthcare Power of Attorney, Living will  Pre-existing out of facility DNR order (yellow form or pink MOST form)  No data  "MOST" Form in Place?  No data      TOTAL TIME TAKING CARE OF THIS PATIENT:25 minutes.   Discharged on July 29th.  Cammie Sickle M.D on 12/15/2015 at 9:22 PM   CC: Primary care physician; Rica Mast,  MD   Note: This dictation was prepared with Dragon dictation along with smaller phrase technology. Any transcriptional errors that result from this process are unintentional.

## 2015-12-16 ENCOUNTER — Ambulatory Visit (INDEPENDENT_AMBULATORY_CARE_PROVIDER_SITE_OTHER): Payer: BLUE CROSS/BLUE SHIELD | Admitting: Internal Medicine

## 2015-12-16 ENCOUNTER — Encounter: Payer: Self-pay | Admitting: Internal Medicine

## 2015-12-16 VITALS — BP 128/60 | HR 78 | Ht 62.0 in | Wt 166.6 lb

## 2015-12-16 DIAGNOSIS — C8583 Other specified types of non-Hodgkin lymphoma, intra-abdominal lymph nodes: Secondary | ICD-10-CM | POA: Diagnosis not present

## 2015-12-16 NOTE — Progress Notes (Signed)
Pre visit review using our clinic review tool, if applicable. No additional management support is needed unless otherwise documented below in the visit note. 

## 2015-12-16 NOTE — Progress Notes (Signed)
Subjective:    Patient ID: Stacy Murillo, female    DOB: 06-Apr-1956, 60 y.o.   MRN: [id number]  HPI  60YO female presents for follow up.  Last seen in 08/2015 for physical exam. Noted to have weight loss, fatigue and labs showed abnormal CBC. Ultimately found to have lymphoma. Course of treatment complicated by mucositis, fevers and DVT.  Completed R-CHOP #4 yesterday. Feeling well. Energy level is good. Next cycle Aug 24th.   Wt Readings from Last 3 Encounters:  12/16/15 166 lb 9.6 oz (75.6 kg)  12/12/15 163 lb (73.9 kg)  12/05/15 163 lb (73.9 kg)   BP Readings from Last 3 Encounters:  12/16/15 128/60  12/15/15 125/72  12/12/15 139/83    Past Medical History:  Diagnosis Date  . Arthritis   . Blood dyscrasia   . Cancer ([place])   . Chest pain, unspecified   . Diverticulosis 07/06/15  . Dysrhythmia   . Esophagitis 07/06/15  . Fatigue   . Gastritis 07/06/15  . GERD (gastroesophageal reflux disease)   . 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 Leg cramps   . Lymphoma ([place]) 09/14/15   Monoclonal B cell lymphoma  . Night sweats   . Osteoporosis   . Overweight(278.02)    Obesity  . Palpitations   . Shortness of breath dyspnea   . Tachycardia   . Thrombocytopenia ([place])    Family History  Problem Relation Age of Onset  . Diabetes Neg Hx   . Heart disease Father     CABG x 4  . Hypertension Mother   . Pancreatic cancer Mother   . Ulcers Mother   . Asthma Mother    Past Surgical History:  Procedure Laterality Date  . ABDOMINAL HYSTERECTOMY  2005  . BONE MARROW BIOPSY  09/14/15  . CHOLECYSTECTOMY  1997  . COLONOSCOPY  07/05/2014  . ESOPHAGOGASTRODUODENOSCOPY  07/05/2014  . INGUINAL LYMPH NODE BIOPSY Right 10/05/2015   Procedure: INGUINAL LYMPH NODE BIOPSY;  Surgeon: Christene Lye, MD;  Location: [facility];  Service: General;  Laterality: Right;  . PERIPHERAL VASCULAR CATHETERIZATION N/A 09/27/2015   Procedure: Glori Luis Cath Insertion;  Surgeon: Algernon Huxley, MD;  Location:  [place] [facility];  Service: Cardiovascular;  Laterality: N/A;  . PORTACATH PLACEMENT Right    Social History   Social History  . Marital status: Married    Spouse name: N/A  . Number of children: N/A  . Years of education: N/A   Social History Main Topics  . Smoking status: Never Smoker  . Smokeless tobacco: Never Used  . Alcohol use No  . Drug use: No  . Sexual activity: Not Asked   Other Topics Concern  . None   Social History Narrative   Married   Does not get regular exercise    Review of Systems  Constitutional: Negative for appetite change, chills, fatigue, fever and unexpected weight change.  Eyes: Negative for visual disturbance.  Respiratory: Negative for shortness of breath.   Cardiovascular: Negative for chest pain and leg swelling.  Gastrointestinal: Negative for abdominal distention, abdominal pain, constipation, diarrhea, nausea and vomiting.  Skin: Negative for color change and rash.  Hematological: Negative for adenopathy. Does not bruise/bleed easily.  Psychiatric/Behavioral: Negative for dysphoric mood. The patient is not nervous/anxious.        Objective:    BP 128/60 (BP Location: Left Arm, Patient Position: Sitting, Cuff Size: Large)   Pulse 78   Ht 5' 2"  (1.575 m)   Wt  166 lb 9.6 oz (75.6 kg)   SpO2 98%   BMI 30.47 kg/m  Physical Exam  Constitutional: She is oriented to person, place, and time. She appears well-developed and well-nourished. No distress.  HENT:  Head: Normocephalic and atraumatic.  Right Ear: External ear normal.  Left Ear: External ear normal.  Nose: Nose normal.  Mouth/Throat: Oropharynx is clear and moist. No oropharyngeal exudate.  Eyes: Conjunctivae are normal. Pupils are equal, round, and reactive to light. Right eye exhibits no discharge. Left eye exhibits no discharge. No scleral icterus.  Neck: Normal range of motion. Neck supple. No tracheal deviation present. No thyromegaly present.  Cardiovascular: Normal  rate, regular rhythm, normal heart sounds and intact distal pulses.  Exam reveals no gallop and no friction rub.   No murmur heard. Pulmonary/Chest: Effort normal and breath sounds normal. No respiratory distress. She has no wheezes. She has no rales. She exhibits no tenderness.  Musculoskeletal: Normal range of motion. She exhibits no edema or tenderness.  Lymphadenopathy:    She has no cervical adenopathy.  Neurological: She is alert and oriented to person, place, and time. No cranial nerve deficit. She exhibits normal muscle tone. Coordination normal.  Skin: Skin is warm and dry. No rash noted. She is not diaphoretic. No erythema. No pallor.  Psychiatric: She has a normal mood and affect. Her behavior is normal. Judgment and thought content normal.          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 Doing very well. Completed R-CHOP cycle #4 yesterday. Will continue to follow with oncology. Follow up as new patient with Dr. Derrel Nip.       Other Visit Diagnoses   None.      Return in about 3 months (around 03/17/2016).  Ronette Deter, MD [facility]

## 2015-12-16 NOTE — Assessment & Plan Note (Signed)
Reviewed recent oncology notes, imaging, labs. Doing very well. Completed R-CHOP cycle #4 yesterday. Will continue to follow with oncology. Follow up as new patient with Dr. Derrel Nip.

## 2015-12-16 NOTE — Patient Instructions (Signed)
Follow up with Dr. Derrel Nip in 3 months.

## 2015-12-22 ENCOUNTER — Inpatient Hospital Stay: Payer: BLUE CROSS/BLUE SHIELD

## 2015-12-22 DIAGNOSIS — C8583 Other specified types of non-Hodgkin lymphoma, intra-abdominal lymph nodes: Secondary | ICD-10-CM

## 2015-12-22 DIAGNOSIS — C8333 Diffuse large B-cell lymphoma, intra-abdominal lymph nodes: Secondary | ICD-10-CM | POA: Diagnosis not present

## 2015-12-22 LAB — CBC WITH DIFFERENTIAL/PLATELET
BASOS ABS: 0 10*3/uL (ref 0–0.1)
Basophils Relative: 1 %
EOS PCT: 3 %
Eosinophils Absolute: 0.1 10*3/uL (ref 0–0.7)
HCT: 28.1 % — ABNORMAL LOW (ref 35.0–47.0)
Hemoglobin: 9.8 g/dL — ABNORMAL LOW (ref 12.0–16.0)
LYMPHS ABS: 0.4 10*3/uL — AB (ref 1.0–3.6)
LYMPHS PCT: 10 %
MCH: 32.1 pg (ref 26.0–34.0)
MCHC: 34.8 g/dL (ref 32.0–36.0)
MCV: 92.2 fL (ref 80.0–100.0)
MONO ABS: 0.3 10*3/uL (ref 0.2–0.9)
MONOS PCT: 8 %
Neutro Abs: 3.2 10*3/uL (ref 1.4–6.5)
Neutrophils Relative %: 78 %
PLATELETS: 101 10*3/uL — AB (ref 150–440)
RBC: 3.05 MIL/uL — ABNORMAL LOW (ref 3.80–5.20)
RDW: 14.9 % — AB (ref 11.5–14.5)
WBC: 4 10*3/uL (ref 3.6–11.0)

## 2015-12-22 LAB — COMPREHENSIVE METABOLIC PANEL
ALT: 18 U/L (ref 14–54)
ANION GAP: 6 (ref 5–15)
AST: 18 U/L (ref 15–41)
Albumin: 4 g/dL (ref 3.5–5.0)
Alkaline Phosphatase: 77 U/L (ref 38–126)
BILIRUBIN TOTAL: 0.9 mg/dL (ref 0.3–1.2)
BUN: 18 mg/dL (ref 6–20)
CHLORIDE: 105 mmol/L (ref 101–111)
CO2: 29 mmol/L (ref 22–32)
Calcium: 8.9 mg/dL (ref 8.9–10.3)
Creatinine, Ser: 0.7 mg/dL (ref 0.44–1.00)
Glucose, Bld: 112 mg/dL — ABNORMAL HIGH (ref 65–99)
POTASSIUM: 3.5 mmol/L (ref 3.5–5.1)
Sodium: 140 mmol/L (ref 135–145)
TOTAL PROTEIN: 5.8 g/dL — AB (ref 6.5–8.1)

## 2015-12-29 ENCOUNTER — Inpatient Hospital Stay: Payer: BLUE CROSS/BLUE SHIELD

## 2015-12-29 DIAGNOSIS — C8583 Other specified types of non-Hodgkin lymphoma, intra-abdominal lymph nodes: Secondary | ICD-10-CM

## 2015-12-29 DIAGNOSIS — C8333 Diffuse large B-cell lymphoma, intra-abdominal lymph nodes: Secondary | ICD-10-CM | POA: Diagnosis not present

## 2015-12-29 LAB — CBC WITH DIFFERENTIAL/PLATELET
BASOS PCT: 1 %
Basophils Absolute: 0 10*3/uL (ref 0–0.1)
EOS ABS: 0 10*3/uL (ref 0–0.7)
Eosinophils Relative: 1 %
HEMATOCRIT: 31.8 % — AB (ref 35.0–47.0)
Hemoglobin: 11.2 g/dL — ABNORMAL LOW (ref 12.0–16.0)
Lymphocytes Relative: 10 %
Lymphs Abs: 0.6 10*3/uL — ABNORMAL LOW (ref 1.0–3.6)
MCH: 32.6 pg (ref 26.0–34.0)
MCHC: 35.2 g/dL (ref 32.0–36.0)
MCV: 92.8 fL (ref 80.0–100.0)
MONOS PCT: 10 %
Monocytes Absolute: 0.6 10*3/uL (ref 0.2–0.9)
NEUTROS PCT: 78 %
Neutro Abs: 5 10*3/uL (ref 1.4–6.5)
Platelets: 190 10*3/uL (ref 150–440)
RBC: 3.43 MIL/uL — ABNORMAL LOW (ref 3.80–5.20)
RDW: 15.7 % — AB (ref 11.5–14.5)
WBC: 6.4 10*3/uL (ref 3.6–11.0)

## 2015-12-29 LAB — COMPREHENSIVE METABOLIC PANEL
ALBUMIN: 4.2 g/dL (ref 3.5–5.0)
ALK PHOS: 71 U/L (ref 38–126)
ALT: 17 U/L (ref 14–54)
ANION GAP: 5 (ref 5–15)
AST: 17 U/L (ref 15–41)
BILIRUBIN TOTAL: 0.5 mg/dL (ref 0.3–1.2)
BUN: 15 mg/dL (ref 6–20)
CALCIUM: 9 mg/dL (ref 8.9–10.3)
CO2: 27 mmol/L (ref 22–32)
Chloride: 107 mmol/L (ref 101–111)
Creatinine, Ser: 0.83 mg/dL (ref 0.44–1.00)
GLUCOSE: 100 mg/dL — AB (ref 65–99)
POTASSIUM: 4 mmol/L (ref 3.5–5.1)
Sodium: 139 mmol/L (ref 135–145)
TOTAL PROTEIN: 6.4 g/dL — AB (ref 6.5–8.1)

## 2016-01-05 ENCOUNTER — Inpatient Hospital Stay: Payer: BLUE CROSS/BLUE SHIELD

## 2016-01-05 ENCOUNTER — Inpatient Hospital Stay (HOSPITAL_BASED_OUTPATIENT_CLINIC_OR_DEPARTMENT_OTHER): Payer: BLUE CROSS/BLUE SHIELD | Admitting: Internal Medicine

## 2016-01-05 ENCOUNTER — Encounter: Payer: Self-pay | Admitting: Internal Medicine

## 2016-01-05 VITALS — BP 123/79 | HR 80 | Temp 95.6°F | Resp 16 | Ht 62.0 in | Wt 164.2 lb

## 2016-01-05 DIAGNOSIS — R000 Tachycardia, unspecified: Secondary | ICD-10-CM

## 2016-01-05 DIAGNOSIS — C8333 Diffuse large B-cell lymphoma, intra-abdominal lymph nodes: Secondary | ICD-10-CM | POA: Diagnosis not present

## 2016-01-05 DIAGNOSIS — D696 Thrombocytopenia, unspecified: Secondary | ICD-10-CM | POA: Diagnosis not present

## 2016-01-05 DIAGNOSIS — Z86718 Personal history of other venous thrombosis and embolism: Secondary | ICD-10-CM

## 2016-01-05 DIAGNOSIS — C8583 Other specified types of non-Hodgkin lymphoma, intra-abdominal lymph nodes: Secondary | ICD-10-CM

## 2016-01-05 DIAGNOSIS — R079 Chest pain, unspecified: Secondary | ICD-10-CM

## 2016-01-05 DIAGNOSIS — M818 Other osteoporosis without current pathological fracture: Secondary | ICD-10-CM

## 2016-01-05 DIAGNOSIS — E669 Obesity, unspecified: Secondary | ICD-10-CM | POA: Diagnosis not present

## 2016-01-05 DIAGNOSIS — Z79899 Other long term (current) drug therapy: Secondary | ICD-10-CM

## 2016-01-05 LAB — COMPREHENSIVE METABOLIC PANEL
ALK PHOS: 56 U/L (ref 38–126)
ALT: 24 U/L (ref 14–54)
AST: 30 U/L (ref 15–41)
Albumin: 4.1 g/dL (ref 3.5–5.0)
Anion gap: 5 (ref 5–15)
BILIRUBIN TOTAL: 0.6 mg/dL (ref 0.3–1.2)
BUN: 16 mg/dL (ref 6–20)
CALCIUM: 9 mg/dL (ref 8.9–10.3)
CO2: 25 mmol/L (ref 22–32)
CREATININE: 0.69 mg/dL (ref 0.44–1.00)
Chloride: 107 mmol/L (ref 101–111)
Glucose, Bld: 129 mg/dL — ABNORMAL HIGH (ref 65–99)
Potassium: 3.6 mmol/L (ref 3.5–5.1)
Sodium: 137 mmol/L (ref 135–145)
Total Protein: 6.5 g/dL (ref 6.5–8.1)

## 2016-01-05 LAB — CBC WITH DIFFERENTIAL/PLATELET
Basophils Absolute: 0 10*3/uL (ref 0–0.1)
Basophils Relative: 1 %
Eosinophils Absolute: 0 10*3/uL (ref 0–0.7)
Eosinophils Relative: 0 %
HCT: 32.6 % — ABNORMAL LOW (ref 35.0–47.0)
HEMOGLOBIN: 11.5 g/dL — AB (ref 12.0–16.0)
LYMPHS ABS: 0.2 10*3/uL — AB (ref 1.0–3.6)
LYMPHS PCT: 3 %
MCH: 32.5 pg (ref 26.0–34.0)
MCHC: 35.3 g/dL (ref 32.0–36.0)
MCV: 92.2 fL (ref 80.0–100.0)
Monocytes Absolute: 0.3 10*3/uL (ref 0.2–0.9)
Monocytes Relative: 5 %
NEUTROS PCT: 91 %
Neutro Abs: 5.2 10*3/uL (ref 1.4–6.5)
Platelets: 208 10*3/uL (ref 150–440)
RBC: 3.54 MIL/uL — AB (ref 3.80–5.20)
RDW: 15.3 % — ABNORMAL HIGH (ref 11.5–14.5)
WBC: 5.7 10*3/uL (ref 3.6–11.0)

## 2016-01-05 MED ORDER — DOXORUBICIN HCL CHEMO IV INJECTION 2 MG/ML
50.0000 mg/m2 | Freq: Once | INTRAVENOUS | Status: AC
  Administered 2016-01-05: 92 mg via INTRAVENOUS
  Filled 2016-01-05: qty 46

## 2016-01-05 MED ORDER — SODIUM CHLORIDE 0.9 % IV SOLN
Freq: Once | INTRAVENOUS | Status: AC
  Administered 2016-01-05: 10:00:00 via INTRAVENOUS
  Filled 2016-01-05: qty 1000

## 2016-01-05 MED ORDER — SODIUM CHLORIDE 0.9 % IV SOLN
375.0000 mg/m2 | Freq: Once | INTRAVENOUS | Status: AC
  Administered 2016-01-05: 700 mg via INTRAVENOUS
  Filled 2016-01-05: qty 50

## 2016-01-05 MED ORDER — SODIUM CHLORIDE 0.9 % IV SOLN
10.0000 mg | Freq: Once | INTRAVENOUS | Status: AC
  Administered 2016-01-05: 10 mg via INTRAVENOUS
  Filled 2016-01-05: qty 1

## 2016-01-05 MED ORDER — PEGFILGRASTIM 6 MG/0.6ML ~~LOC~~ PSKT
6.0000 mg | PREFILLED_SYRINGE | Freq: Once | SUBCUTANEOUS | Status: AC
  Administered 2016-01-05: 6 mg via SUBCUTANEOUS
  Filled 2016-01-05: qty 0.6

## 2016-01-05 MED ORDER — HEPARIN SOD (PORK) LOCK FLUSH 100 UNIT/ML IV SOLN
500.0000 [IU] | Freq: Once | INTRAVENOUS | Status: AC
  Administered 2016-01-05: 500 [IU] via INTRAVENOUS
  Filled 2016-01-05: qty 5

## 2016-01-05 MED ORDER — VINCRISTINE SULFATE CHEMO INJECTION 1 MG/ML
2.0000 mg | Freq: Once | INTRAVENOUS | Status: AC
  Administered 2016-01-05: 2 mg via INTRAVENOUS
  Filled 2016-01-05: qty 2

## 2016-01-05 MED ORDER — PALONOSETRON HCL INJECTION 0.25 MG/5ML
0.2500 mg | Freq: Once | INTRAVENOUS | Status: AC
  Administered 2016-01-05: 0.25 mg via INTRAVENOUS
  Filled 2016-01-05: qty 5

## 2016-01-05 MED ORDER — SODIUM CHLORIDE 0.9 % IV SOLN
1400.0000 mg | Freq: Once | INTRAVENOUS | Status: AC
  Administered 2016-01-05: 1400 mg via INTRAVENOUS
  Filled 2016-01-05: qty 20

## 2016-01-05 MED ORDER — ACETAMINOPHEN 325 MG PO TABS
650.0000 mg | ORAL_TABLET | Freq: Once | ORAL | Status: AC
  Administered 2016-01-05: 650 mg via ORAL
  Filled 2016-01-05: qty 2

## 2016-01-05 MED ORDER — SODIUM CHLORIDE 0.9 % IV SOLN: 375.0000 mg/m2 | Freq: Once | INTRAVENOUS | Status: DC

## 2016-01-05 MED ORDER — DIPHENHYDRAMINE HCL 25 MG PO CAPS
50.0000 mg | ORAL_CAPSULE | Freq: Once | ORAL | Status: AC
  Administered 2016-01-05: 50 mg via ORAL
  Filled 2016-01-05: qty 2

## 2016-01-05 MED ORDER — SODIUM CHLORIDE 0.9 % IJ SOLN
10.0000 mL | Freq: Once | INTRAMUSCULAR | Status: AC
  Administered 2016-01-05: 10 mL via INTRAVENOUS
  Filled 2016-01-05: qty 10

## 2016-01-05 NOTE — Assessment & Plan Note (Addendum)
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 PET- significant response noted. S/p 3 cycles; s/p High dose MXT [july 24th]. Clinically doing well.   # proceed with cycle # 5 of R-CHOP today; will plan PET after 6 cycles.   # will need admission on sep 11th for high dose MXT.   #  Left lower extremity DVT- improved on Eliquis.  # labs in 1 week/ follow up on sep11th for in pt admission.

## 2016-01-05 NOTE — Progress Notes (Signed)
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 CT- 1-2 CM LN subpectoral/medistinal/ retro-peritoneal/pelvic/ Right inguinal LN 1.6cm- Bx- DLBCL- ABC; myc-POS; FISH gene re-arragement-NEG. PET- MULTIPLE LN/ Bone involvement; May 25th- R-CHOP q3 W x2; PET- Excellent Response.   # Lumbar puncture- difficult-spinal headache.   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  Patient is feeling well- No chest pain or shortness of breath or cough.. Denies any nausea or vomiting. Denies any headaches. Denies any sores anymore. the legs. No bleeding. No nausea no vomiting.  Denies any significant headaches. Appetite is good. Denies any tingling and numbness.  REVIEW OF SYSTEMS:  A complete 10 point review of system is done which is  negative except mentioned above/history of present illness.   PAST MEDICAL HISTORY :  Past Medical History:  Diagnosis Date  . Arthritis   . Blood dyscrasia   . Cancer ([place])   . Chest pain, unspecified   . Diverticulosis 07/06/15  . Dysrhythmia   . Esophagitis 07/06/15  . Fatigue   . Gastritis 07/06/15  . GERD (gastroesophageal reflux disease)   . 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 Leg cramps   . Lymphoma ([place]) 09/14/15   Monoclonal B cell lymphoma  . Night sweats   . Osteoporosis   . Overweight(278.02)    Obesity  . Palpitations   . Shortness of breath dyspnea   . Tachycardia   . Thrombocytopenia ([place])     PAST SURGICAL HISTORY :   Past Surgical History:  Procedure Laterality Date  . ABDOMINAL HYSTERECTOMY  2005  . BONE MARROW BIOPSY  09/14/15  . CHOLECYSTECTOMY  1997  . COLONOSCOPY  07/05/2014  . ESOPHAGOGASTRODUODENOSCOPY  07/05/2014  . INGUINAL LYMPH NODE BIOPSY Right 10/05/2015   Procedure: INGUINAL LYMPH NODE BIOPSY;  Surgeon: Christene Lye, MD;  Location: [facility];  Service: General;  Laterality: Right;  . PERIPHERAL VASCULAR CATHETERIZATION N/A 09/27/2015   Procedure: Glori Luis Cath Insertion;  Surgeon: Algernon Huxley, MD;  Location: [place] [facility];  Service: Cardiovascular;  Laterality: N/A;  . PORTACATH PLACEMENT Right     FAMILY HISTORY :   Family History  Problem Relation Age of Onset  . Heart disease Father     CABG x 4  . Hypertension Mother   . Pancreatic cancer Mother   . Ulcers Mother   . Asthma Mother   .  Diabetes Neg Hx     SOCIAL HISTORY:   Social History  Substance Use Topics  . Smoking status: Never Smoker  . Smokeless tobacco: Never Used  . Alcohol use No    ALLERGIES:  has No Known Allergies.  MEDICATIONS:  Current Outpatient Prescriptions  Medication Sig Dispense Refill  . acyclovir (ZOVIRAX) 400 MG tablet Take 1 tablet (400 mg total) by mouth 2 (two) times daily. 60 tablet 3  . apixaban (ELIQUIS) 5 MG TABS tablet Take 1 tablet  (5 mg total) by mouth 2 (two) times daily. From 10/27/15 60 tablet 3  . lidocaine-prilocaine (EMLA) cream Apply 1 application topically as needed (prior to accessing port).    . predniSONE (DELTASONE) 50 MG tablet   3  . ranitidine (ZANTAC) 150 MG tablet Take 150 mg by mouth 2 (two) times daily.     . cyclobenzaprine (FLEXERIL) 5 MG tablet Take 1 tablet (5 mg total) by mouth 3 (three) times daily as needed for muscle spasms. (Patient not taking: Reported on 01/05/2016) 30 tablet 0  . diazepam (VALIUM) 5 MG tablet   0  . ondansetron (ZOFRAN) 8 MG tablet Take 8 mg by mouth every 8 (eight) hours as needed for nausea or vomiting.    Marland Kitchen oxyCODONE-acetaminophen (PERCOCET) 7.5-325 MG tablet   0  . prochlorperazine (COMPAZINE) 10 MG tablet Take 1 tablet (10 mg total) by mouth every 6 (six) hours as needed for nausea or vomiting. (Patient not taking: Reported on 01/05/2016) 40 tablet 1   No current facility-administered medications for this visit.    Facility-Administered Medications Ordered in Other Visits  Medication Dose Route Frequency Provider Last Rate Last Dose  . 0.9 %  sodium chloride infusion   Intravenous Continuous Evlyn Kanner, NP   Stopped at 10/14/15 1312    PHYSICAL EXAMINATION: ECOG PERFORMANCE STATUS: 1 - Symptomatic but completely ambulatory  BP 123/79 (BP Location: Left Arm, Patient Position: Sitting)   Pulse 80   Temp (!) 95.6 F (35.3 C) (Tympanic)   Resp 16   Ht _0  (1.575 m)   Wt 164 lb 3.2 oz (74.5 kg)   BMI 30.03 kg/m   Filed Weights   01/05/16 0901  Weight: 164 lb 3.2 oz (74.5 kg)    GENERAL: Well-nourished well-developed; Alert, no distress and comfortable.   Accompanied by her husband.  EYES: no pallor or icterus OROPHARYNX: no thrush; good dentition  NECK: supple, no masses felt LYMPH:  no palpable lymphadenopathy in the cervical, axillary or inguinal regions LUNGS: clear to auscultation and  No wheeze or crackles HEART/CVS: regular rate & rhythm and no  murmurs; No lower extremity edema. ABDOMEN:abdomen soft, non-tender and normal bowel sounds Musculoskeletal:no cyanosis of digits and no clubbing  PSYCH: alert & oriented x 3 with fluent speech NEURO: no focal motor/sensory deficits SKIN:  no rashes or significant lesions;  LABORATORY DATA:  I have reviewed the data as listed    Component Value Date/Time   NA 137 01/05/2016 0844   NA 139 06/29/2010 0749   K 3.6 01/05/2016 0844   CL 107 01/05/2016 0844   CO2 25 01/05/2016 0844   GLUCOSE 129 (H) 01/05/2016 0844   BUN 16 01/05/2016 0844   BUN 16 06/29/2010 0749   CREATININE 0.69 01/05/2016 0844   CALCIUM 9.0 01/05/2016 0844   PROT 6.5 01/05/2016 0844   ALBUMIN 4.1 01/05/2016 0844   AST 30 01/05/2016 0844   ALT 24 01/05/2016 0844   ALKPHOS 56 01/05/2016 0844  BILITOT 0.6 01/05/2016 0844   GFRNONAA >60 01/05/2016 0844   GFRAA >60 01/05/2016 0844    No results found for: SPEP, UPEP  Lab Results  Component Value Date   WBC 5.7 01/05/2016   NEUTROABS 5.2 01/05/2016   HGB 11.5 (L) 01/05/2016   HCT 32.6 (L) 01/05/2016   MCV 92.2 01/05/2016   PLT 208 01/05/2016      Chemistry      Component Value Date/Time   NA 137 01/05/2016 0844   NA 139 06/29/2010 0749   K 3.6 01/05/2016 0844   CL 107 01/05/2016 0844   CO2 25 01/05/2016 0844   BUN 16 01/05/2016 0844   BUN 16 06/29/2010 0749   CREATININE 0.69 01/05/2016 0844   GLU 93 06/29/2010 0749      Component Value Date/Time   CALCIUM 9.0 01/05/2016 0844   ALKPHOS 56 01/05/2016 0844   AST 30 01/05/2016 0844   ALT 24 01/05/2016 0844   BILITOT 0.6 01/05/2016 0844       RADIOGRAPHIC STUDIES: I have personally reviewed the radiological images as listed and agreed with the findings in the report. No results found.   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 PET- significant response  noted. S/p 3 cycles; s/p High dose MXT [july 24th]. Clinically doing well.   # proceed with cycle # 5 of R-CHOP today; will plan PET after 6 cycles.   # will need admission on sep 11th for high dose MXT.   #  Left lower extremity DVT- improved on Eliquis.  # labs in 1 week/ follow up on sep11th for in pt admission.    Orders Placed This Encounter  Procedures  . Basic metabolic panel    Standing Status:   Future    Standing Expiration Date:   01/04/2017   All questions were answered. The patient knows to call the clinic with any problems, questions or concerns.      Cammie Sickle, MD 01/05/2016 1:59 PM

## 2016-01-05 NOTE — Progress Notes (Signed)
Pt's only complaint is a runny nose and numbness in fingers that comes and goes

## 2016-01-12 ENCOUNTER — Inpatient Hospital Stay: Payer: BLUE CROSS/BLUE SHIELD

## 2016-01-12 DIAGNOSIS — C8583 Other specified types of non-Hodgkin lymphoma, intra-abdominal lymph nodes: Secondary | ICD-10-CM

## 2016-01-12 DIAGNOSIS — C8333 Diffuse large B-cell lymphoma, intra-abdominal lymph nodes: Secondary | ICD-10-CM | POA: Diagnosis not present

## 2016-01-12 LAB — CBC WITH DIFFERENTIAL/PLATELET
BASOS ABS: 0 10*3/uL (ref 0–0.1)
BASOS PCT: 0 %
EOS PCT: 0 %
Eosinophils Absolute: 0 10*3/uL (ref 0–0.7)
HCT: 32.7 % — ABNORMAL LOW (ref 35.0–47.0)
Hemoglobin: 11.5 g/dL — ABNORMAL LOW (ref 12.0–16.0)
LYMPHS PCT: 6 %
Lymphs Abs: 0.4 10*3/uL — ABNORMAL LOW (ref 1.0–3.6)
MCH: 32.8 pg (ref 26.0–34.0)
MCHC: 35.3 g/dL (ref 32.0–36.0)
MCV: 92.9 fL (ref 80.0–100.0)
MONO ABS: 0.3 10*3/uL (ref 0.2–0.9)
Monocytes Relative: 5 %
Neutro Abs: 5.8 10*3/uL (ref 1.4–6.5)
Neutrophils Relative %: 89 %
PLATELETS: 108 10*3/uL — AB (ref 150–440)
RBC: 3.52 MIL/uL — AB (ref 3.80–5.20)
RDW: 13.9 % (ref 11.5–14.5)
WBC: 6.6 10*3/uL (ref 3.6–11.0)

## 2016-01-12 LAB — COMPREHENSIVE METABOLIC PANEL
ALBUMIN: 4.1 g/dL (ref 3.5–5.0)
ALT: 13 U/L — AB (ref 14–54)
AST: 15 U/L (ref 15–41)
Alkaline Phosphatase: 82 U/L (ref 38–126)
Anion gap: 5 (ref 5–15)
BUN: 17 mg/dL (ref 6–20)
CHLORIDE: 104 mmol/L (ref 101–111)
CO2: 30 mmol/L (ref 22–32)
CREATININE: 0.8 mg/dL (ref 0.44–1.00)
Calcium: 9.2 mg/dL (ref 8.9–10.3)
GFR calc Af Amer: >60 mL/min (ref >60)
GLUCOSE: 99 mg/dL (ref 65–99)
POTASSIUM: 4.4 mmol/L (ref 3.5–5.1)
SODIUM: 139 mmol/L (ref 135–145)
Total Bilirubin: 0.6 mg/dL (ref 0.3–1.2)
Total Protein: 6.3 g/dL — ABNORMAL LOW (ref 6.5–8.1)

## 2016-01-14 ENCOUNTER — Other Ambulatory Visit: Payer: Self-pay | Admitting: Internal Medicine

## 2016-01-14 DIAGNOSIS — R110 Nausea: Secondary | ICD-10-CM

## 2016-01-14 DIAGNOSIS — E860 Dehydration: Secondary | ICD-10-CM

## 2016-01-14 DIAGNOSIS — C8583 Other specified types of non-Hodgkin lymphoma, intra-abdominal lymph nodes: Secondary | ICD-10-CM

## 2016-01-17 ENCOUNTER — Other Ambulatory Visit: Payer: Self-pay | Admitting: Internal Medicine

## 2016-01-17 DIAGNOSIS — C8583 Other specified types of non-Hodgkin lymphoma, intra-abdominal lymph nodes: Secondary | ICD-10-CM

## 2016-01-20 ENCOUNTER — Other Ambulatory Visit: Payer: Self-pay | Admitting: Internal Medicine

## 2016-01-20 ENCOUNTER — Other Ambulatory Visit: Payer: Self-pay

## 2016-01-20 DIAGNOSIS — C8583 Other specified types of non-Hodgkin lymphoma, intra-abdominal lymph nodes: Secondary | ICD-10-CM

## 2016-01-23 ENCOUNTER — Inpatient Hospital Stay: Payer: BLUE CROSS/BLUE SHIELD | Admitting: Internal Medicine

## 2016-01-23 ENCOUNTER — Inpatient Hospital Stay
Admission: AD | Admit: 2016-01-23 | Discharge: 2016-01-28 | DRG: 846 | Disposition: A | Discharge status: Home / Self Care | Payer: BLUE CROSS/BLUE SHIELD | Source: Ambulatory Visit | Attending: Internal Medicine | Admitting: Internal Medicine

## 2016-01-23 ENCOUNTER — Encounter: Payer: Self-pay | Admitting: Internal Medicine

## 2016-01-23 ENCOUNTER — Inpatient Hospital Stay: Admit: 2016-01-23 | Payer: Self-pay | Admitting: Internal Medicine

## 2016-01-23 ENCOUNTER — Other Ambulatory Visit: Payer: Self-pay

## 2016-01-23 ENCOUNTER — Inpatient Hospital Stay: Payer: BLUE CROSS/BLUE SHIELD | Attending: Internal Medicine

## 2016-01-23 VITALS — BP 113/78 | HR 82 | Temp 96.2°F | Resp 16 | Ht 62.0 in | Wt 165.4 lb

## 2016-01-23 DIAGNOSIS — Z86718 Personal history of other venous thrombosis and embolism: Secondary | ICD-10-CM

## 2016-01-23 DIAGNOSIS — C8583 Other specified types of non-Hodgkin lymphoma, intra-abdominal lymph nodes: Secondary | ICD-10-CM

## 2016-01-23 DIAGNOSIS — Z5111 Encounter for antineoplastic chemotherapy: Principal | ICD-10-CM

## 2016-01-23 DIAGNOSIS — M810 Age-related osteoporosis without current pathological fracture: Secondary | ICD-10-CM | POA: Diagnosis present

## 2016-01-23 DIAGNOSIS — Z79899 Other long term (current) drug therapy: Secondary | ICD-10-CM | POA: Diagnosis not present

## 2016-01-23 DIAGNOSIS — T451X5A Adverse effect of antineoplastic and immunosuppressive drugs, initial encounter: Secondary | ICD-10-CM | POA: Diagnosis not present

## 2016-01-23 DIAGNOSIS — Z7901 Long term (current) use of anticoagulants: Secondary | ICD-10-CM | POA: Insufficient documentation

## 2016-01-23 DIAGNOSIS — C8330 Diffuse large B-cell lymphoma, unspecified site: Secondary | ICD-10-CM

## 2016-01-23 DIAGNOSIS — M1990 Unspecified osteoarthritis, unspecified site: Secondary | ICD-10-CM | POA: Diagnosis not present

## 2016-01-23 DIAGNOSIS — Z800 Family history of malignant neoplasm of digestive organs: Secondary | ICD-10-CM

## 2016-01-23 DIAGNOSIS — D61810 Antineoplastic chemotherapy induced pancytopenia: Secondary | ICD-10-CM | POA: Diagnosis not present

## 2016-01-23 DIAGNOSIS — C8333 Diffuse large B-cell lymphoma, intra-abdominal lymph nodes: Secondary | ICD-10-CM | POA: Diagnosis present

## 2016-01-23 DIAGNOSIS — Z8249 Family history of ischemic heart disease and other diseases of the circulatory system: Secondary | ICD-10-CM | POA: Diagnosis not present

## 2016-01-23 DIAGNOSIS — E669 Obesity, unspecified: Secondary | ICD-10-CM | POA: Diagnosis not present

## 2016-01-23 DIAGNOSIS — Z825 Family history of asthma and other chronic lower respiratory diseases: Secondary | ICD-10-CM | POA: Diagnosis not present

## 2016-01-23 DIAGNOSIS — K219 Gastro-esophageal reflux disease without esophagitis: Secondary | ICD-10-CM | POA: Diagnosis present

## 2016-01-23 DIAGNOSIS — E876 Hypokalemia: Secondary | ICD-10-CM | POA: Diagnosis not present

## 2016-01-23 DIAGNOSIS — D61818 Other pancytopenia: Secondary | ICD-10-CM | POA: Diagnosis not present

## 2016-01-23 DIAGNOSIS — C8299 Follicular lymphoma, unspecified, extranodal and solid organ sites: Secondary | ICD-10-CM

## 2016-01-23 LAB — URINE PH
PH: 6 (ref 5.0–8.0)
PH: 7 (ref 5.0–8.0)
PH: 7 (ref 5.0–8.0)
PH: 7 (ref 5.0–8.0)
PH: 7 (ref 5.0–8.0)
pH: 5 (ref 5.0–8.0)
pH: 7 (ref 5.0–8.0)
pH: 7 (ref 5.0–8.0)

## 2016-01-23 LAB — CBC WITH DIFFERENTIAL/PLATELET
BASOS ABS: 0.1 10*3/uL (ref 0–0.1)
BASOS PCT: 1 %
Eosinophils Absolute: 0 10*3/uL (ref 0–0.7)
Eosinophils Relative: 0 %
HEMATOCRIT: 32.9 % — AB (ref 35.0–47.0)
HEMOGLOBIN: 11.6 g/dL — AB (ref 12.0–16.0)
Lymphocytes Relative: 8 %
Lymphs Abs: 0.5 10*3/uL — ABNORMAL LOW (ref 1.0–3.6)
MCH: 32.6 pg (ref 26.0–34.0)
MCHC: 35.2 g/dL (ref 32.0–36.0)
MCV: 92.4 fL (ref 80.0–100.0)
MONO ABS: 1.1 10*3/uL — AB (ref 0.2–0.9)
Monocytes Relative: 18 %
NEUTROS ABS: 4.6 10*3/uL (ref 1.4–6.5)
NEUTROS PCT: 73 %
Platelets: 170 10*3/uL (ref 150–440)
RBC: 3.55 MIL/uL — ABNORMAL LOW (ref 3.80–5.20)
RDW: 14.6 % — AB (ref 11.5–14.5)
WBC: 6.3 10*3/uL (ref 3.6–11.0)

## 2016-01-23 LAB — COMPREHENSIVE METABOLIC PANEL
ALBUMIN: 4.1 g/dL (ref 3.5–5.0)
ALT: 23 U/L (ref 14–54)
AST: 27 U/L (ref 15–41)
Alkaline Phosphatase: 68 U/L (ref 38–126)
Anion gap: 5 (ref 5–15)
BILIRUBIN TOTAL: 0.6 mg/dL (ref 0.3–1.2)
BUN: 20 mg/dL (ref 6–20)
CALCIUM: 9 mg/dL (ref 8.9–10.3)
CO2: 25 mmol/L (ref 22–32)
Chloride: 108 mmol/L (ref 101–111)
Creatinine, Ser: 0.75 mg/dL (ref 0.44–1.00)
GFR calc Af Amer: >60 mL/min (ref >60)
GFR calc non Af Amer: >60 mL/min (ref >60)
GLUCOSE: 99 mg/dL (ref 65–99)
POTASSIUM: 3.9 mmol/L (ref 3.5–5.1)
Sodium: 138 mmol/L (ref 135–145)
Total Protein: 6.4 g/dL — ABNORMAL LOW (ref 6.5–8.1)

## 2016-01-23 MED ORDER — STERILE WATER FOR INJECTION IV SOLN
INTRAVENOUS | Status: DC
  Administered 2016-01-23 – 2016-01-28 (×14): via INTRAVENOUS
  Filled 2016-01-23 (×21): qty 9.71

## 2016-01-23 MED ORDER — ALUM & MAG HYDROXIDE-SIMETH 200-200-20 MG/5ML PO SUSP: 60.0000 mL | ORAL | Status: DC | PRN

## 2016-01-23 MED ORDER — ONDANSETRON 4 MG PO TBDP: 4.0000 mg | ORAL_TABLET | Freq: Three times a day (TID) | ORAL | Status: DC | PRN

## 2016-01-23 MED ORDER — SENNOSIDES-DOCUSATE SODIUM 8.6-50 MG PO TABS
1.0000 | ORAL_TABLET | Freq: Every evening | ORAL | Status: DC | PRN
  Administered 2016-01-23 – 2016-01-24 (×2): 1 via ORAL
  Filled 2016-01-23 (×2): qty 1

## 2016-01-23 MED ORDER — HYDROCORTISONE 2.5 % RE CREA: 1.0000 "application " | TOPICAL_CREAM | Freq: Two times a day (BID) | RECTAL | Status: DC | PRN

## 2016-01-23 MED ORDER — SODIUM CHLORIDE 0.9 % IV SOLN: Freq: Once | Status: DC

## 2016-01-23 MED ORDER — ENOXAPARIN SODIUM 40 MG/0.4ML ~~LOC~~ SOLN: 40.0000 mg | SUBCUTANEOUS | Status: DC

## 2016-01-23 MED ORDER — ONDANSETRON HCL 4 MG/2ML IJ SOLN: 4.0000 mg | Freq: Three times a day (TID) | INTRAMUSCULAR | Status: DC | PRN

## 2016-01-23 MED ORDER — SODIUM CHLORIDE 0.9 % IV SOLN
INTRAVENOUS | Status: DC
  Administered 2016-01-23: 13:00:00 via INTRAVENOUS
  Filled 2016-01-23: qty 1000

## 2016-01-23 MED ORDER — GUAIFENESIN-DM 100-10 MG/5ML PO SYRP
10.0000 mL | ORAL_SOLUTION | ORAL | Status: DC | PRN
Start: 2016-01-23 — End: 2016-01-28

## 2016-01-23 MED ORDER — SODIUM CHLORIDE 0.9 % IV SOLN
Freq: Once | INTRAVENOUS | Status: AC
  Administered 2016-01-23: 13:00:00 via INTRAVENOUS
  Filled 2016-01-23 (×2): qty 8

## 2016-01-23 MED ORDER — SODIUM CHLORIDE 0.9 % IV SOLN: 8.0000 mg | Freq: Three times a day (TID) | INTRAVENOUS | Status: DC | PRN

## 2016-01-23 MED ORDER — ACETAMINOPHEN 325 MG PO TABS
650.0000 mg | ORAL_TABLET | ORAL | Status: DC | PRN
  Administered 2016-01-24 – 2016-01-25 (×2): 650 mg via ORAL
  Filled 2016-01-23 (×2): qty 2

## 2016-01-23 MED ORDER — ACYCLOVIR 200 MG PO CAPS
400.0000 mg | ORAL_CAPSULE | Freq: Two times a day (BID) | ORAL | Status: DC
  Administered 2016-01-23 – 2016-01-27 (×9): 400 mg via ORAL
  Filled 2016-01-23 (×10): qty 2

## 2016-01-23 MED ORDER — APIXABAN 5 MG PO TABS
5.0000 mg | ORAL_TABLET | Freq: Two times a day (BID) | ORAL | Status: DC
  Administered 2016-01-23 – 2016-01-27 (×9): 5 mg via ORAL
  Filled 2016-01-23 (×9): qty 1

## 2016-01-23 MED ORDER — SODIUM CHLORIDE 0.9 % IV SOLN
6.3000 g | Freq: Once | INTRAVENOUS | Status: AC
  Administered 2016-01-23: 18:00:00 6.3 g via INTRAVENOUS
  Filled 2016-01-23 (×2): qty 126

## 2016-01-23 MED ORDER — ONDANSETRON HCL 4 MG PO TABS: 4.0000 mg | ORAL_TABLET | Freq: Three times a day (TID) | ORAL | Status: DC | PRN

## 2016-01-23 NOTE — Progress Notes (Signed)
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 CT- 1-2 CM LN subpectoral/medistinal/ retro-peritoneal/pelvic/ Right inguinal LN 1.6cm- Bx- DLBCL- ABC; myc-POS; FISH gene re-arragement-NEG. PET- MULTIPLE LN/ Bone involvement; May 25th- R-CHOP q3 W x2; PET- Excellent Response.   # Lumbar puncture- difficult-spinal headache.   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  Patient is here for admission for high-dose methotrexate in patient  REVIEW OF SYSTEMS:  A complete 10 point review of system is done which is negative except mentioned above/history of present illness.   PAST MEDICAL HISTORY :  Past Medical History:  Diagnosis Date  . Arthritis   . Blood dyscrasia   . Cancer ([place])   . Chest pain, unspecified   .  Diverticulosis 07/06/15  . Dysrhythmia   . Esophagitis 07/06/15  . Fatigue   . Gastritis 07/06/15  . GERD (gastroesophageal reflux disease)   . 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 Leg cramps   . Lymphoma ([place]) 09/14/15   Monoclonal B cell lymphoma  . Night sweats   . Osteoporosis   . Overweight(278.02)    Obesity  . Palpitations   . Shortness of breath dyspnea   . Tachycardia   . Thrombocytopenia ([place])     PAST SURGICAL HISTORY :   Past Surgical History:  Procedure Laterality Date  . ABDOMINAL HYSTERECTOMY  2005  . BONE MARROW BIOPSY  09/14/15  . CHOLECYSTECTOMY  1997  . COLONOSCOPY  07/05/2014  . ESOPHAGOGASTRODUODENOSCOPY  07/05/2014  . INGUINAL LYMPH NODE BIOPSY Right 10/05/2015   Procedure: INGUINAL LYMPH NODE BIOPSY;  Surgeon: Christene Lye, MD;  Location: [facility] ORS;  Service: General;  Laterality: Right;  . PERIPHERAL VASCULAR CATHETERIZATION N/A 09/27/2015   Procedure: Glori Luis Cath Insertion;  Surgeon: Algernon Huxley, MD;  Location: [place] [facility];  Service: Cardiovascular;  Laterality: N/A;  . PORTACATH PLACEMENT Right     FAMILY HISTORY :   Family History  Problem Relation Age of Onset  . Heart disease Father     CABG x 4  . Hypertension Mother   . Pancreatic cancer Mother   . Ulcers Mother   . Asthma Mother   . Diabetes Neg Hx     SOCIAL HISTORY:   Social History  Substance Use Topics  . Smoking status: Never Smoker  . Smokeless tobacco: Never Used  . Alcohol use  No    ALLERGIES:  has No Known Allergies.  MEDICATIONS:  Current Outpatient Prescriptions  Medication Sig Dispense Refill  . acyclovir (ZOVIRAX) 400 MG tablet Take 1 tablet (400 mg total) by mouth 2 (two) times daily. 60 tablet 3  . apixaban (ELIQUIS) 5 MG TABS tablet Take 1 tablet (5 mg total) by mouth 2 (two) times daily. From 10/27/15 60 tablet 3  . lidocaine-prilocaine (EMLA) cream Apply 1 application topically as needed (prior to accessing port).    . ondansetron (ZOFRAN) 8 MG tablet  Take 8 mg by mouth every 8 (eight) hours as needed for nausea or vomiting.    . predniSONE (DELTASONE) 50 MG tablet TAKE TWO TABLETS ONCE A DAY FOR FIVE DAYS. START ON DAY OF YOUR CHEMOTHERAPY.TAKE WITH FOOD. 10 tablet 3  . prochlorperazine (COMPAZINE) 10 MG tablet Take 1 tablet (10 mg total) by mouth every 6 (six) hours as needed for nausea or vomiting. 40 tablet 1  . ranitidine (ZANTAC) 150 MG tablet Take 150 mg by mouth 2 (two) times daily.     . cyclobenzaprine (FLEXERIL) 5 MG tablet Take 1 tablet (5 mg total) by mouth 3 (three) times daily as needed for muscle spasms. (Patient not taking: Reported on 01/23/2016) 30 tablet 0  . diazepam (VALIUM) 5 MG tablet   0  . oxyCODONE-acetaminophen (PERCOCET) 7.5-325 MG tablet   0   No current facility-administered medications for this visit.    Facility-Administered Medications Ordered in Other Visits  Medication Dose Route Frequency Provider Last Rate Last Dose  . 0.9 %  sodium chloride infusion   Intravenous Continuous Evlyn Kanner, NP   Stopped at 10/14/15 1312    PHYSICAL EXAMINATION: ECOG PERFORMANCE STATUS: 1 - Symptomatic but completely ambulatory  BP 113/78 (BP Location: Left Arm, Patient Position: Sitting)   Pulse 82   Temp (!) 96.2 F (35.7 C) (Tympanic)   Resp 16   Ht _0  (1.575 m)   Wt 165 lb 6.4 oz (75 kg)   BMI 30.25 kg/m   Filed Weights   01/23/16 0931  Weight: 165 lb 6.4 oz (75 kg)    GENERAL: Well-nourished well-developed; Alert, no distress and comfortable.   Accompanied by her husband.  EYES: no pallor or icterus OROPHARYNX: no thrush; good dentition  NECK: supple, no masses felt LYMPH:  no palpable lymphadenopathy in the cervical, axillary or inguinal regions LUNGS: clear to auscultation and  No wheeze or crackles HEART/CVS: regular rate & rhythm and no murmurs; No lower extremity edema. ABDOMEN:abdomen soft, non-tender and normal bowel sounds Musculoskeletal:no cyanosis of digits and no clubbing  PSYCH:  alert & oriented x 3 with fluent speech NEURO: no focal motor/sensory deficits SKIN:  no rashes or significant lesions;  LABORATORY DATA:  I have reviewed the data as listed    Component Value Date/Time   NA 138 01/23/2016 0912   NA 139 06/29/2010 0749   K 3.9 01/23/2016 0912   CL 108 01/23/2016 0912   CO2 25 01/23/2016 0912   GLUCOSE 99 01/23/2016 0912   BUN 20 01/23/2016 0912   BUN 16 06/29/2010 0749   CREATININE 0.75 01/23/2016 0912   CALCIUM 9.0 01/23/2016 0912   PROT 6.4 (L) 01/23/2016 0912   ALBUMIN 4.1 01/23/2016 0912   AST 27 01/23/2016 0912   ALT 23 01/23/2016 0912   ALKPHOS 68 01/23/2016 0912   BILITOT 0.6 01/23/2016 0912   GFRNONAA >60 01/23/2016 0912   GFRAA >60 01/23/2016 0912    No  results found for: SPEP, UPEP  Lab Results  Component Value Date   WBC 6.3 01/23/2016   NEUTROABS 4.6 01/23/2016   HGB 11.6 (L) 01/23/2016   HCT 32.9 (L) 01/23/2016   MCV 92.4 01/23/2016   PLT 170 01/23/2016      Chemistry      Component Value Date/Time   NA 138 01/23/2016 0912   NA 139 06/29/2010 0749   K 3.9 01/23/2016 0912   CL 108 01/23/2016 0912   CO2 25 01/23/2016 0912   BUN 20 01/23/2016 0912   BUN 16 06/29/2010 0749   CREATININE 0.75 01/23/2016 0912   GLU 93 06/29/2010 0749      Component Value Date/Time   CALCIUM 9.0 01/23/2016 0912   ALKPHOS 68 01/23/2016 0912   AST 27 01/23/2016 0912   ALT 23 01/23/2016 0912   BILITOT 0.6 01/23/2016 0912       RADIOGRAPHIC STUDIES: I have personally reviewed the radiological images as listed and agreed with the findings in the report. No results found.   ASSESSMENT & PLAN:  No problem-specific Assessment & Plan notes found for this encounter.   No orders of the defined types were placed in this encounter.  All questions were answered. The patient knows to call the clinic with any problems, questions or concerns.      Cammie Sickle, MD 01/23/2016 10:04 AM  This encounter was created in error -  please disregard.

## 2016-01-23 NOTE — Progress Notes (Signed)
Pt admitted to room 120.  Nira Conn, RN x [phone] Called report to Rowe Robert, RN on 1C.  Pt direct admit to start high dose methotrexate.  Pt alert and oriented. Vitals stable. She has no complaints today.  MD signed admission orders. Chemo orders signed. Pt declined wheelchair in transport today. States that she would like to walk to unit.

## 2016-01-23 NOTE — H&P (Signed)
[place] NOTE  Patient Care Team: Jackolyn Confer, MD as PCP - General (Internal Medicine) Cammie Sickle, MD as Consulting Physician (Internal Medicine) Seeplaputhur Robinette Haines, MD (General Surgery) [name], MD as Consulting Physician (Cardiology)  CHIEF COMPLAINTS/PURPOSE OF CONSULTATION:  Cc: DLBCL- admission for intrathecal prophylaxis  HISTORY OF PRESENTING ILLNESS:  Stacy Murillo 60 y.o.  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   Patient is currently status post 5 cycles of R CHOP she had excellent response post 3 cycles of chemotherapy. Her appetite is good.   No tingling and numbness. No chest pain or shortness of breath or cough.   ROS: A complete 10 point review of system is done which is negative except mentioned above in history of present illness  MEDICAL HISTORY:  Past Medical History:  Diagnosis Date  . Arthritis   . Blood dyscrasia   . Cancer ([place])   . Chest pain, unspecified   . Diverticulosis 07/06/15  . Dysrhythmia   . Esophagitis 07/06/15  . Fatigue   . Gastritis 07/06/15  . GERD (gastroesophageal reflux disease)   . 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 Leg cramps   . 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 Night sweats   . Osteoporosis   . Overweight(278.02)    Obesity  . Palpitations   . Shortness of breath dyspnea   . Tachycardia   . Thrombocytopenia ([place])     SURGICAL HISTORY: Past Surgical History:  Procedure Laterality Date  . ABDOMINAL HYSTERECTOMY  2005  . BONE MARROW BIOPSY  09/14/15  . CHOLECYSTECTOMY  1997  . COLONOSCOPY  07/05/2014  . ESOPHAGOGASTRODUODENOSCOPY  07/05/2014  . INGUINAL LYMPH NODE BIOPSY Right 10/05/2015   Procedure: INGUINAL LYMPH NODE BIOPSY;  Surgeon: Christene Lye, MD;  Location: [facility] ORS;  Service: General;  Laterality: Right;  . PERIPHERAL VASCULAR  CATHETERIZATION N/A 09/27/2015   Procedure: Glori Luis Cath Insertion;  Surgeon: Algernon Huxley, MD;  Location: [place] [facility];  Service: Cardiovascular;  Laterality: N/A;  . PORTACATH PLACEMENT Right     SOCIAL HISTORY: Social History   Social History  . Marital status: Married    Spouse name: N/A  . Number of children: N/A  . Years of education: N/A   Occupational History  . Not on file.   Social History Main Topics  . Smoking status: Never Smoker  . Smokeless tobacco: Never Used  . Alcohol use No  . Drug use: No  . Sexual activity: Yes    Birth control/ protection: None   Other Topics Concern  . Not on file   Social History Narrative   Married   Does not get regular exercise    FAMILY HISTORY: Family History  Problem Relation Age of Onset  . Heart disease Father     CABG x 4  . Hypertension Mother   . Pancreatic cancer Mother   . Ulcers Mother   . Asthma Mother   . Diabetes Neg Hx     ALLERGIES:  has no allergies on file.  MEDICATIONS:  Current Facility-Administered Medications  Medication Dose Route Frequency Provider Last Rate Last Dose  . acetaminophen (TYLENOL) tablet 650 mg  650 mg Oral Q4H PRN Cammie Sickle, MD      . alum & mag hydroxide-simeth (MAALOX/MYLANTA) 200-200-20 MG/5ML suspension 60 mL  60 mL Oral Q4H PRN Lenetta Quaker R  Rogue Bussing, MD      . enoxaparin (LOVENOX) injection 40 mg  40 mg Subcutaneous Q24H Cammie Sickle, MD      . guaiFENesin-dextromethorphan (ROBITUSSIN DM) 100-10 MG/5ML syrup 10 mL  10 mL Oral Q4H PRN Cammie Sickle, MD      . hydrocortisone (ANUSOL-HC) 2.5 % rectal cream 1 application  1 application Rectal BID PRN Cammie Sickle, MD      . methotrexate (50 mg/ml) 6.3 g in sodium chloride 0.9 % 1,000 mL injection  6.3 g Intravenous Once Cammie Sickle, MD      . ondansetron (ZOFRAN) tablet 4-8 mg  4-8 mg Oral Q8H PRN Cammie Sickle, MD       Or  . ondansetron (ZOFRAN-ODT) disintegrating tablet  4-8 mg  4-8 mg Oral Q8H PRN Cammie Sickle, MD       Or  . ondansetron (ZOFRAN) injection 4 mg  4 mg Intravenous Q8H PRN Cammie Sickle, MD       Or  . ondansetron (ZOFRAN) 8 mg in sodium chloride 0.9 % 50 mL IVPB  8 mg Intravenous Q8H PRN Cammie Sickle, MD      . senna-docusate (Senokot-S) tablet 1 tablet  1 tablet Oral QHS PRN Cammie Sickle, MD      . sodium chloride 0.225 % with sodium bicarbonate 100 mEq infusion   Intravenous Continuous Cammie Sickle, MD 125 mL/hr at 01/23/16 1336    . sodium chloride 0.9 % 1,000 mL infusion   Intravenous Continuous Cammie Sickle, MD 20 mL/hr at 01/23/16 1258     Facility-Administered Medications Ordered in Other Encounters  Medication Dose Route Frequency Provider Last Rate Last Dose  . 0.9 %  sodium chloride infusion   Intravenous Continuous Evlyn Kanner, NP   Stopped at 10/14/15 1312      .  PHYSICAL EXAMINATION:  Vitals:   01/23/16 1051 01/23/16 1246  BP: 124/73 111/65  Pulse: 70 71  Resp: 18 18  Temp: 97.8 F (36.6 C) 98.2 F (36.8 C)   Filed Weights   01/23/16 1151  Weight: 165 lb (74.8 kg)    GENERAL: Well-nourished well-developed; Alert, no distress and comfortable.   With her husband. EYES: no pallor or icterus OROPHARYNX: no thrush or ulceration. NECK: supple, no masses felt LYMPH:  no palpable lymphadenopathy in the cervical, axillary or inguinal regions LUNGS: decreased breath sounds to auscultation at bases and  No wheeze or crackles HEART/CVS: regular rate & rhythm and no murmurs; No lower extremity edema ABDOMEN: abdomen soft, non-tender and normal bowel sounds Musculoskeletal:no cyanosis of digits and no clubbing  PSYCH: alert & oriented x 3 with fluent speech NEURO: no focal motor/sensory deficits SKIN:  no rashes or significant lesions  LABORATORY DATA:  I have reviewed the data as listed Lab Results  Component Value Date   WBC 6.3 01/23/2016   HGB 11.6 (L)  01/23/2016   HCT 32.9 (L) 01/23/2016   MCV 92.4 01/23/2016   PLT 170 01/23/2016    Recent Labs  10/20/15 0237  11/07/15 1440  01/05/16 0844 01/12/16 0902 01/23/16 0912  NA  --   < > 140  < > 137 139 138  K  --   < > 3.8  < > 3.6 4.4 3.9  CL  --   < > 104  < > 107 104 108  CO2  --   < > 30  < > 25 30 25   GLUCOSE  --   < >  108*  < > 129* 99 99  BUN  --   < > 11  < > 16 17 20   CREATININE  --   < > 0.68  < > 0.69 0.80 0.75  CALCIUM  --   < > 8.9  < > 9.0 9.2 9.0  GFRNONAA  --   < > >60  < > >60 >60 >60  GFRAA  --   < > >60  < > >60 >60 >60  PROT 5.5*  < > 7.6  < > 6.5 6.3* 6.4*  ALBUMIN 3.2*  < > 3.9  < > 4.1 4.1 4.1  AST 61*  < > 20  < > 30 15 27   ALT 183*  < > 19  < > 24 13* 23  ALKPHOS 199*  < > 109  < > 56 82 68  BILITOT 0.3  < > 0.4  < > 0.6 0.6 0.6  BILIDIR <0.1*  --  <0.1*  --   --   --   --   IBILI NOT CALCULATED  --  NOT CALCULATED  --   --   --   --   < > = values in this interval not displayed.  RADIOGRAPHIC STUDIES: I have personally reviewed the radiological images as listed and agreed with the findings in the report. No results found.  ASSESSMENT & PLAN:   # 60 year old female patient with a history of diffuse large B-cell lymphoma/involving peripheral blood status post 5 cycles of R CHOP currently admitted to the hospital for high-dose methotrexate cycle #2  # Intracranial prophylaxis- with high-dose methotrexate labs within normal limits. Proceed with cycle #2 and monitor methotrexate levels closely after chemotherapy. And adjust leucovorin based on Mxt levels.  # Diffuse large B-cell lymphoma- we will proceed with cycle number 6R CHOP next week on sep 22nd.     Cammie Sickle, MD 01/23/2016 4:13 PM

## 2016-01-23 NOTE — Plan of Care (Signed)
Problem: Education: Goal: Knowledge of the prescribed therapeutic regimen will improve Outcome: Progressing Started  High dose methotrexate  This pm.  urines  Every  Hour til  Ph reached 7. Beginning  Urine ph of 5.  Na bicarb in progress.  Voiding well.  Rt pac  With good blood return.

## 2016-01-23 NOTE — Progress Notes (Addendum)
MEDICATION RELATED CONSULT NOTE - INITIAL   Pharmacy Consult for High- Dose Methotrexate monitoring Indication: Monitoring of methotrexate levels and ordering leucovorin doses.  Not on File  Patient Measurements: Height: 5\' 2"  (157.5 cm) Weight: 165 lb (74.8 kg) IBW/kg (Calculated) : 50.1   Vital Signs: Temp: 98.2 F (36.8 C) (09/11 1246) Temp Source: Oral (09/11 1246) BP: 111/65 (09/11 1246) Pulse Rate: 71 (09/11 1246) Intake/Output from previous day: No intake/output data recorded. Intake/Output from this shift: No intake/output data recorded.  Labs:  Recent Labs  01/23/16 0912  WBC 6.3  HGB 11.6*  HCT 32.9*  PLT 170  CREATININE 0.75  ALBUMIN 4.1  PROT 6.4*  AST 27  ALT 23  ALKPHOS 68  BILITOT 0.6   Estimated Creatinine Clearance: 69.9 mL/min (by C-G formula based on SCr of 0.8 mg/dL).   Microbiology: No results found for this or any previous visit (from the past 720 hour(s)).   Assessment: 60 yo female to start on high dose methotrexate infusion with Leucovorin rescue, hx large B-cell Lymphoma.   Plan:  Per Dr. Rogue Bussing, do not start the methotrexate infusion until urine pH is at 7.0 and maintain> 7.0 during therapy; NaBicarb drip to continue at least 48h after MTX infusion. Per MD, check urine pH at least daily.    Sodium Bicarb Drip:  Goal Urine pH > 7.0 If urine pH less than goal consider increasing NaBicarb drip by 25 ml/hr, or if rate exceeds 175 ml/hr on the current NaBicarb drip, may need to increase bicarb in bag to 150 mEq. Checking urine pH every hour until pH >7.0 then once stabilizes consider checking every 2 hrs then every 4hrs etc . Care order instruction for RN to enter urine pH orders. Continue at least 48 hrs after Methotrexate infusion finished.   Methotrexate Levels:  Pt needs MTX level ordered at 24h after start of infusion (i.e. 20 h after the end of infusion b/c infusion runs over 4 hrs).  Based on MTX level, leucovorin dose  will need to be ordered and adjusted based on the daily MTX level.  Pt will need daily MTX levels and urine pH monitoring until MTX levels are <0.1 microM. (Note: Labs are sent to [facility] so takes 4-6 hrs to get result). Results are faxed? Can also find by going under "Chart Review" then "Media" and look for "Lab result scan".  Leucovorin: Follow Infomation for High-dose methotrexate-rescue- Leucovorin dosing (see [email or web address]).  9/11 @1116  Baseline Urine pH= 5.0  9/11 @ 1500 Urine pH= 6.0. Continue Sodium Bicarb drip at 125 ml/hr for now. Recheck Urine pH in 1 hr.    Pharmacy to follow up MTX level and dose Leucovorin tomorrow 9/12.     Ronen Bromwell A 01/23/2016,3:02 PM

## 2016-01-23 NOTE — Progress Notes (Signed)
Per [facility] standard policy to mix high dose Methotrexate in NS 1035ml (confirmed with Deedra Ehrich).

## 2016-01-24 LAB — URINE PH
PH: 6 (ref 5.0–8.0)
PH: 7 (ref 5.0–8.0)
PH: 7 (ref 5.0–8.0)
PH: 7 (ref 5.0–8.0)
PH: 8 (ref 5.0–8.0)
PH: 8 (ref 5.0–8.0)
PH: 8 (ref 5.0–8.0)
PH: 8 (ref 5.0–8.0)
PH: 9 — AB (ref 5.0–8.0)
pH: 7 (ref 5.0–8.0)
pH: 7 (ref 5.0–8.0)
pH: 8 (ref 5.0–8.0)
pH: 8 (ref 5.0–8.0)
pH: 8 (ref 5.0–8.0)
pH: 9 — ABNORMAL HIGH (ref 5.0–8.0)

## 2016-01-24 LAB — BASIC METABOLIC PANEL
ANION GAP: 7 (ref 5–15)
BUN: 18 mg/dL (ref 6–20)
CALCIUM: 9 mg/dL (ref 8.9–10.3)
CO2: 27 mmol/L (ref 22–32)
Chloride: 106 mmol/L (ref 101–111)
Creatinine, Ser: 0.78 mg/dL (ref 0.44–1.00)
GFR calc Af Amer: >60 mL/min (ref >60)
GLUCOSE: 114 mg/dL — AB (ref 65–99)
POTASSIUM: 3.5 mmol/L (ref 3.5–5.1)
Sodium: 140 mmol/L (ref 135–145)

## 2016-01-24 LAB — CBC
HEMATOCRIT: 30 % — AB (ref 35.0–47.0)
HEMOGLOBIN: 10.8 g/dL — AB (ref 12.0–16.0)
MCH: 32.4 pg (ref 26.0–34.0)
MCHC: 35.9 g/dL (ref 32.0–36.0)
MCV: 90.1 fL (ref 80.0–100.0)
Platelets: 158 10*3/uL (ref 150–440)
RBC: 3.32 MIL/uL — ABNORMAL LOW (ref 3.80–5.20)
RDW: 14.3 % (ref 11.5–14.5)
WBC: 19.3 10*3/uL — ABNORMAL HIGH (ref 3.6–11.0)

## 2016-01-24 MED ORDER — PANTOPRAZOLE SODIUM 40 MG PO TBEC
40.0000 mg | DELAYED_RELEASE_TABLET | Freq: Two times a day (BID) | ORAL | Status: DC
  Administered 2016-01-24 – 2016-01-27 (×7): 40 mg via ORAL
  Filled 2016-01-24 (×7): qty 1

## 2016-01-24 MED ORDER — LEUCOVORIN CALCIUM INJECTION 100 MG
10.0000 mg/m2 | Freq: Four times a day (QID) | INTRAMUSCULAR | Status: DC
  Administered 2016-01-24 – 2016-01-28 (×15): 18 mg via INTRAVENOUS
  Filled 2016-01-24 (×20): qty 0.9

## 2016-01-24 NOTE — Plan of Care (Signed)
Spoke to pharmacists Erasmo Downer; Lattie Haw. Methotrexate levels drawn about 6:00 in the evening; plan during the first dose of leucovorin around the same time. Further leucovorin dosing take place on methotrexate levels by night shift pharmacy.  Urine ph hourly- when more consistent- then check urine pH every 3-4 hours.

## 2016-01-24 NOTE — Progress Notes (Signed)
Patient urine PH every 2 hours throughout the day and has maintained between 7-9.  Discussed with Cory Roughen Pharmacist and per protocol, will now check urine ph every 4 hours unless they become out of the range.

## 2016-01-24 NOTE — Progress Notes (Signed)
MEDICATION RELATED CONSULT NOTE - INITIAL   Pharmacy Consult for High- Dose Methotrexate monitoring Indication: Monitoring of methotrexate levels and ordering leucovorin doses.  No Known Allergies  Patient Measurements: Height: 5\' 2"  (157.5 cm) Weight: 165 lb (74.8 kg) IBW/kg (Calculated) : 50.1   Vital Signs: Temp: 97.7 F (36.5 C) (09/12 1259) Temp Source: Oral (09/12 1259) BP: 126/54 (09/12 1259) Pulse Rate: 67 (09/12 1259) Intake/Output from previous day: 09/11 0701 - 09/12 0700 In: 1675 [I.V.:1675] Out: 4450 [Urine:4450] Intake/Output from this shift: Total I/O In: -  Out: 1400 [Urine:1400]  Labs:  Recent Labs  01/23/16 0912 01/24/16 0425  WBC 6.3 19.3*  HGB 11.6* 10.8*  HCT 32.9* 30.0*  PLT 170 158  CREATININE 0.75 0.78  ALBUMIN 4.1  --   PROT 6.4*  --   AST 27  --   ALT 23  --   ALKPHOS 68  --   BILITOT 0.6  --    Estimated Creatinine Clearance: 69.9 mL/min (by C-G formula based on SCr of 0.8 mg/dL).   Microbiology: No results found for this or any previous visit (from the past 720 hour(s)).   Assessment: 60 yo female to start on high dose methotrexate infusion with Leucovorin rescue, hx large B-cell Lymphoma.   Plan:  Per Dr. Rogue Bussing, do not start the methotrexate infusion until urine pH is at 7.0 and maintain> 7.0 during therapy; NaBicarb drip to continue at least 48h after MTX infusion. Per MD, check urine pH at least daily.    Sodium Bicarb Drip:  Goal Urine pH > 7.0 If urine pH less than goal consider increasing NaBicarb drip by 25 ml/hr, or if rate exceeds 175 ml/hr on the current NaBicarb drip, may need to increase bicarb in bag to 150 mEq. Checking urine pH every hour until pH >7.0 then once stabilizes consider checking every 2 hrs then every 4hrs etc . Care order instruction for RN to enter urine pH orders. Continue at least 48 hrs after Methotrexate infusion finished.  9/12 Urine pH is being maintained between 7 and 9. Will  decrease urine pH checks to q4h unless becomes out of range.  Methotrexate Levels:  Pt needs MTX level ordered at 24h after start of infusion (i.e. 20 h after the end of infusion b/c infusion runs over 4 hrs).  Based on MTX level, leucovorin dose will need to be ordered and adjusted based on the daily MTX level.  Pt will need daily MTX levels and urine pH monitoring until MTX levels are <0.1 microM. (Note: Labs are sent to [facility] so takes 4-6 hrs to get result). Results are faxed? Can also find by going under "Chart Review" then "Media" and look for "Lab result scan".  9/12 1st Methotrexate level to be drawn at 17:45 this evening.  Leucovorin: Follow Infomation for High-dose methotrexate-rescue- Leucovorin dosing (see [email or web address]).  9/12 will initiate Leucovorin rescue with 10mg /m2 IV q6h at 18:00. Will adjust dose based on Methotrexate levels.  Paulina Fusi, PharmD, BCPS 01/24/2016 3:43 PM

## 2016-01-24 NOTE — Progress Notes (Signed)
MEDICATION RELATED CONSULT NOTE - INITIAL   Pharmacy Consult for High- Dose Methotrexate monitoring Indication: Monitoring of methotrexate levels and ordering leucovorin doses.  No Known Allergies  Patient Measurements: Height: 5\' 2"  (157.5 cm) Weight: 165 lb (74.8 kg) IBW/kg (Calculated) : 50.1   Vital Signs: Temp: 98.8 F (37.1 C) (09/12 1949) Temp Source: Oral (09/12 1949) BP: 120/67 (09/12 1949) Pulse Rate: 66 (09/12 1949) Intake/Output from previous day: 09/11 0701 - 09/12 0700 In: 1675 [I.V.:1675] Out: 4450 [Urine:4450] Intake/Output from this shift: Total I/O In: 2395.8 [I.V.:2395.8] Out: 600 [Urine:600]  Labs:  Recent Labs  01/23/16 0912 01/24/16 0425  WBC 6.3 19.3*  HGB 11.6* 10.8*  HCT 32.9* 30.0*  PLT 170 158  CREATININE 0.75 0.78  ALBUMIN 4.1  --   PROT 6.4*  --   AST 27  --   ALT 23  --   ALKPHOS 68  --   BILITOT 0.6  --    Estimated Creatinine Clearance: 69.9 mL/min (by C-G formula based on SCr of 0.8 mg/dL).   Microbiology: No results found for this or any previous visit (from the past 720 hour(s)).   Assessment: 60 yo female to start on high dose methotrexate infusion with Leucovorin rescue, hx large B-cell Lymphoma.   Plan:  Per Dr. Rogue Bussing, do not start the methotrexate infusion until urine pH is at 7.0 and maintain> 7.0 during therapy; NaBicarb drip to continue at least 48h after MTX infusion. Per MD, check urine pH at least daily.    Sodium Bicarb Drip:  Goal Urine pH > 7.0 If urine pH less than goal consider increasing NaBicarb drip by 25 ml/hr, or if rate exceeds 175 ml/hr on the current NaBicarb drip, may need to increase bicarb in bag to 150 mEq. Checking urine pH every hour until pH >7.0 then once stabilizes consider checking every 2 hrs then every 4hrs etc . Care order instruction for RN to enter urine pH orders. Continue at least 48 hrs after Methotrexate infusion finished.  9/12 Urine pH is being maintained between 7 and  9. Will decrease urine pH checks to q4h unless becomes out of range.  9/12 2213 urine pH 9. Continue current rate for now, will recheck pH and adjust if needed to maintain pH 7 to 9.  Methotrexate Levels:  Pt needs MTX level ordered at 24h after start of infusion (i.e. 20 h after the end of infusion b/c infusion runs over 4 hrs).  Based on MTX level, leucovorin dose will need to be ordered and adjusted based on the daily MTX level.  Pt will need daily MTX levels and urine pH monitoring until MTX levels are <0.1 microM. (Note: Labs are sent to [facility] so takes 4-6 hrs to get result). Results are faxed? Can also find by going under "Chart Review" then "Media" and look for "Lab result scan".  9/12 1st Methotrexate level to be drawn at 17:45 this evening. Level resulted 1.84 micromol/L  Leucovorin: Follow Infomation for High-dose methotrexate-rescue- Leucovorin dosing (see [email or web address]).  9/12 will initiate Leucovorin rescue with 10mg /m2 IV q6h at 18:00. Will adjust dose based on Methotrexate levels.  9/12 1745 methotrexate level 1.84 micromol/L. Continue with current leucovorin dose of 10 mg/m2 IV Q6H, will recheck methotrexate level at 48 hours post dose.   Lashelle Koy A. Jordan Hawks, PharmD, BCPS 01/24/2016 11:01 PM

## 2016-01-24 NOTE — Progress Notes (Signed)
[place]  [phone] [phone]) [phone]  ID: Nishka B [place] OB: [birth year]  MR#: [id number]  ZHG#:[id number]  Patient Care Team: Jackolyn Confer, MD as PCP - General (Internal Medicine) Cammie Sickle, MD as Consulting Physician (Internal Medicine) Seeplaputhur Robinette Haines, MD (General Surgery) [name], MD as Consulting Physician (Cardiology)  CHIEF COMPLAINT: Intrathecal prophylaxis with methotrexate.  INTERVAL HISTORY: Patient continues to feel well without any complaints since admission. She is hoping to be discharged by Friday to go to a horse show on Saturday.  REVIEW OF SYSTEMS:   Review of Systems  Constitutional: Negative.  Negative for fever, malaise/fatigue and weight loss.  Respiratory: Negative.  Negative for cough and shortness of breath.   Cardiovascular: Negative.  Negative for chest pain.  Gastrointestinal: Negative.  Negative for abdominal pain.  Genitourinary: Negative.   Musculoskeletal: Negative.   Neurological: Negative.  Negative for weakness.  Psychiatric/Behavioral: Negative.  The patient is not nervous/anxious.     As per HPI. Otherwise, a complete review of systems is negative.  PAST MEDICAL HISTORY: Past Medical History:  Diagnosis Date  . Arthritis   . Blood dyscrasia   . Cancer ([place])   . Chest pain, unspecified   . Diverticulosis 07/06/15  . Dysrhythmia   . Esophagitis 07/06/15  . Fatigue   . Gastritis 07/06/15  . GERD (gastroesophageal reflux disease)   . 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 Leg cramps   . Lymphoma ([place]) 09/14/15   Monoclonal B cell lymphoma  . Night sweats   . Osteoporosis   . Overweight(278.02)    Obesity  . Palpitations   . Shortness of breath dyspnea   . Tachycardia   . Thrombocytopenia ([place])     PAST SURGICAL HISTORY: Past Surgical History:  Procedure Laterality Date  . ABDOMINAL HYSTERECTOMY  2005  . BONE MARROW BIOPSY  09/14/15  . CHOLECYSTECTOMY  1997  . COLONOSCOPY   07/05/2014  . ESOPHAGOGASTRODUODENOSCOPY  07/05/2014  . INGUINAL LYMPH NODE BIOPSY Right 10/05/2015   Procedure: INGUINAL LYMPH NODE BIOPSY;  Surgeon: Christene Lye, MD;  Location: [facility] ORS;  Service: General;  Laterality: Right;  . PERIPHERAL VASCULAR CATHETERIZATION N/A 09/27/2015   Procedure: Glori Luis Cath Insertion;  Surgeon: Algernon Huxley, MD;  Location: [place] [facility];  Service: Cardiovascular;  Laterality: N/A;  . PORTACATH PLACEMENT Right     FAMILY HISTORY: Family History  Problem Relation Age of Onset  . Heart disease Father     CABG x 4  . Hypertension Mother   . Pancreatic cancer Mother   . Ulcers Mother   . Asthma Mother   . Diabetes Neg Hx     ADVANCED DIRECTIVES (Y/N):  _0 @  HEALTH MAINTENANCE: Social History  Substance Use Topics  . Smoking status: Never Smoker  . Smokeless tobacco: Never Used  . Alcohol use No     Colonoscopy:  PAP:  Bone density:  Lipid panel:  No Known Allergies  Current Facility-Administered Medications  Medication Dose Route Frequency Provider Last Rate Last Dose  . acetaminophen (TYLENOL) tablet 650 mg  650 mg Oral Q4H PRN Cammie Sickle, MD   650 mg at 01/24/16 0734  . acyclovir (ZOVIRAX) 200 MG capsule 400 mg  400 mg Oral BID Lloyd Huger, MD   400 mg at 01/24/16 0734  . alum & mag hydroxide-simeth (MAALOX/MYLANTA) 200-200-20 MG/5ML suspension 60 mL  60 mL Oral Q4H PRN Cammie Sickle, MD      . apixaban Arne [place])  tablet 5 mg  5 mg Oral BID Lloyd Huger, MD   5 mg at 01/24/16 0734  . guaiFENesin-dextromethorphan (ROBITUSSIN DM) 100-10 MG/5ML syrup 10 mL  10 mL Oral Q4H PRN Cammie Sickle, MD      . hydrocortisone (ANUSOL-HC) 2.5 % rectal cream 1 application  1 application Rectal BID PRN Cammie Sickle, MD      . leucovorin injection 18 mg  10 mg/m2 Intravenous Q6H Vira Blanco, RPH      . ondansetron (ZOFRAN) tablet 4-8 mg  4-8 mg Oral Q8H PRN Cammie Sickle, MD       Or  .  ondansetron (ZOFRAN-ODT) disintegrating tablet 4-8 mg  4-8 mg Oral Q8H PRN Cammie Sickle, MD       Or  . ondansetron (ZOFRAN) injection 4 mg  4 mg Intravenous Q8H PRN Cammie Sickle, MD       Or  . ondansetron (ZOFRAN) 8 mg in sodium chloride 0.9 % 50 mL IVPB  8 mg Intravenous Q8H PRN Cammie Sickle, MD      . senna-docusate (Senokot-S) tablet 1 tablet  1 tablet Oral QHS PRN Cammie Sickle, MD   1 tablet at 01/23/16 2110  . sodium chloride 0.225 % with sodium bicarbonate 100 mEq infusion   Intravenous Continuous Cammie Sickle, MD 125 mL/hr at 01/24/16 0523     Facility-Administered Medications Ordered in Other Encounters  Medication Dose Route Frequency Provider Last Rate Last Dose  . 0.9 %  sodium chloride infusion   Intravenous Continuous Evlyn Kanner, NP   Stopped at 10/14/15 1312    OBJECTIVE: Vitals:   01/24/16 0451 01/24/16 1259  BP: 139/65 (!) 126/54  Pulse: (!) 57 67  Resp:  16  Temp: 98.6 F (37 C) 97.7 F (36.5 C)     Body mass index is 30.18 kg/m.    ECOG FS:0 - Asymptomatic  General: Well-developed, well-nourished, no acute distress. Eyes: Pink conjunctiva, anicteric sclera. Lungs: Clear to auscultation bilaterally. Heart: Regular rate and rhythm. No rubs, murmurs, or gallops. Abdomen: Soft, nontender, nondistended. No organomegaly noted, normoactive bowel sounds. Musculoskeletal: No edema, cyanosis, or clubbing. Neuro: Alert, answering all questions appropriately. Cranial nerves grossly intact. Skin: No rashes or petechiae noted. Psych: Normal affect.   LAB RESULTS:  Lab Results  Component Value Date   NA 140 01/24/2016   K 3.5 01/24/2016   CL 106 01/24/2016   CO2 27 01/24/2016   GLUCOSE 114 (H) 01/24/2016   BUN 18 01/24/2016   CREATININE 0.78 01/24/2016   CALCIUM 9.0 01/24/2016   PROT 6.4 (L) 01/23/2016   ALBUMIN 4.1 01/23/2016   AST 27 01/23/2016   ALT 23 01/23/2016   ALKPHOS 68 01/23/2016   BILITOT 0.6 01/23/2016    GFRNONAA >60 01/24/2016   GFRAA >60 01/24/2016    Lab Results  Component Value Date   WBC 19.3 (H) 01/24/2016   NEUTROABS 4.6 01/23/2016   HGB 10.8 (L) 01/24/2016   HCT 30.0 (L) 01/24/2016   MCV 90.1 01/24/2016   PLT 158 01/24/2016     STUDIES: No results found.  ASSESSMENT/PLAN:    # 60 year old female patient with a history of diffuse large B-cell lymphoma/involving peripheral blood status post 5 cycles of R CHOP currently admitted to the hospital for high-dose methotrexate cycle #2  # Intracranial prophylaxis- with high-dose methotrexate labs within normal limits. Patient received cycle #2 last night. Monitor methotrexate levels closely after chemotherapy,  adjust leucovorin based  on Mxt levels.  # Diffuse large B-cell lymphoma- cycle number 6 R-CHOP scheduled for February 03, 2016.   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

## 2016-01-25 DIAGNOSIS — E876 Hypokalemia: Secondary | ICD-10-CM

## 2016-01-25 LAB — CBC WITH DIFFERENTIAL/PLATELET
BASOS ABS: 0 10*3/uL (ref 0–0.1)
BASOS PCT: 1 %
EOS ABS: 0 10*3/uL (ref 0–0.7)
EOS PCT: 0 %
HEMATOCRIT: 29.1 % — AB (ref 35.0–47.0)
Hemoglobin: 10.4 g/dL — ABNORMAL LOW (ref 12.0–16.0)
Lymphocytes Relative: 9 %
Lymphs Abs: 0.6 10*3/uL — ABNORMAL LOW (ref 1.0–3.6)
MCH: 32.5 pg (ref 26.0–34.0)
MCHC: 35.9 g/dL (ref 32.0–36.0)
MCV: 90.7 fL (ref 80.0–100.0)
MONO ABS: 0.5 10*3/uL (ref 0.2–0.9)
MONOS PCT: 7 %
NEUTROS ABS: 6.1 10*3/uL (ref 1.4–6.5)
Neutrophils Relative %: 83 %
PLATELETS: 154 10*3/uL (ref 150–440)
RBC: 3.2 MIL/uL — ABNORMAL LOW (ref 3.80–5.20)
RDW: 14.5 % (ref 11.5–14.5)
WBC: 7.3 10*3/uL (ref 3.6–11.0)

## 2016-01-25 LAB — BASIC METABOLIC PANEL
ANION GAP: 6 (ref 5–15)
BUN: 19 mg/dL (ref 6–20)
CALCIUM: 8.5 mg/dL — AB (ref 8.9–10.3)
CO2: 34 mmol/L — AB (ref 22–32)
CREATININE: 1 mg/dL (ref 0.44–1.00)
Chloride: 104 mmol/L (ref 101–111)
GFR, EST NON AFRICAN AMERICAN: 60 mL/min — AB (ref >60)
GLUCOSE: 84 mg/dL (ref 65–99)
Potassium: 3.2 mmol/L — ABNORMAL LOW (ref 3.5–5.1)
Sodium: 144 mmol/L (ref 135–145)

## 2016-01-25 LAB — URINE PH
PH: 7 (ref 5.0–8.0)
PH: 9 — AB (ref 5.0–8.0)
PH: 9 — AB (ref 5.0–8.0)
pH: 8 (ref 5.0–8.0)
pH: 9 — ABNORMAL HIGH (ref 5.0–8.0)
pH: 8 (ref 5.0–8.0)

## 2016-01-25 MED ORDER — POTASSIUM CHLORIDE CRYS ER 20 MEQ PO TBCR
40.0000 meq | EXTENDED_RELEASE_TABLET | Freq: Every day | ORAL | Status: DC
  Administered 2016-01-25 – 2016-01-27 (×2): 40 meq via ORAL
  Filled 2016-01-25 (×2): qty 2

## 2016-01-25 NOTE — Progress Notes (Signed)
Stacy Murillo   DOB:09-04-1955   [id number]    Subjective: Patient tolerated chemotherapy well. No nausea no vomiting. No sores in the mouth. No constipation or diarrhea. No fevers or chills.   Objective:  Vitals:   01/25/16 0502 01/25/16 0503  BP: (!) 102/39 130/62  Pulse: 68 77  Resp:    Temp: 97.6 F (36.4 C)      Intake/Output Summary (Last 24 hours) at 01/25/16 1544 Last data filed at 01/25/16 1500  Gross per 24 hour  Intake             4500 ml  Output             4525 ml  Net              -25 ml    GENERAL Alert, no distress and comfortable.  EYES: no pallor or icterus OROPHARYNX: no thrush or ulceration. NECK: supple, no masses felt LYMPH:  no palpable lymphadenopathy in the cervical, axillary or inguinal regions LUNGS: decreased breath sounds to auscultation at bases and  No wheeze or crackles HEART/CVS: regular rate & rhythm and no murmurs; No lower extremity edema ABDOMEN: abdomen soft, tender  on deep palpation. and normal bowel sounds Musculoskeletal:no cyanosis of digits and no clubbing  PSYCH: alert & oriented x 3 with fluent speech NEURO: no focal motor/sensory deficits SKIN:  no rashes or significant lesions   Labs:  Lab Results  Component Value Date   WBC 7.3 01/25/2016   HGB 10.4 (L) 01/25/2016   HCT 29.1 (L) 01/25/2016   MCV 90.7 01/25/2016   PLT 154 01/25/2016   NEUTROABS 6.1 01/25/2016    Lab Results  Component Value Date   NA 144 01/25/2016   K 3.2 (L) 01/25/2016   CL 104 01/25/2016   CO2 34 (H) 01/25/2016    Studies:  No results found.  Assessment & Plan:   # Diffuse large B-cell lymphoma- stage IV- currently admitted to the hospital for high-dose methotrexate.  # CNS prophylaxis- with high-dose methotrexate [3.5 g/m]; today # 3 . Patient's methotrexate levels- 1.8.4 [day#2];  Cont as per the nomogram/ pharmacy was continued leucovorin grams every 6 hours.  Continue leucovorin rescue until levels < 0.05; pharmacy following.    # Hypokalemia- Supp K dur 40 q day.   # Diffuse large B-cell lymphoma status post 5 cycles of R CHOP- excellent response to chemotherapy. We will likely have to R-CHOP #6 chemotherapy next week.   # Recent DVT Eliquis continue the same.   Cammie Sickle, MD 01/25/2016  3:44 PM

## 2016-01-26 DIAGNOSIS — Z86718 Personal history of other venous thrombosis and embolism: Secondary | ICD-10-CM

## 2016-01-26 DIAGNOSIS — Z7901 Long term (current) use of anticoagulants: Secondary | ICD-10-CM

## 2016-01-26 DIAGNOSIS — D61818 Other pancytopenia: Secondary | ICD-10-CM

## 2016-01-26 LAB — URINE PH
PH: 8 (ref 5.0–8.0)
PH: 9 — AB (ref 5.0–8.0)
PH: 9 — AB (ref 5.0–8.0)
PH: 9 — AB (ref 5.0–8.0)
pH: 9 — ABNORMAL HIGH (ref 5.0–8.0)
pH: 9 — ABNORMAL HIGH (ref 5.0–8.0)

## 2016-01-26 LAB — BASIC METABOLIC PANEL
ANION GAP: 4 — AB (ref 5–15)
BUN: 10 mg/dL (ref 6–20)
CALCIUM: 7.3 mg/dL — AB (ref 8.9–10.3)
CO2: 43 mmol/L — AB (ref 22–32)
CREATININE: 0.87 mg/dL (ref 0.44–1.00)
Chloride: 95 mmol/L — ABNORMAL LOW (ref 101–111)
GFR calc non Af Amer: >60 mL/min (ref >60)
Glucose, Bld: 80 mg/dL (ref 65–99)
Potassium: 2.7 mmol/L — CL (ref 3.5–5.1)
SODIUM: 142 mmol/L (ref 135–145)

## 2016-01-26 LAB — CBC WITH DIFFERENTIAL/PLATELET
BASOS ABS: 0 10*3/uL (ref 0–0.1)
Basophils Relative: 0 %
Eosinophils Absolute: 0 10*3/uL (ref 0–0.7)
Eosinophils Relative: 1 %
HEMATOCRIT: 25.7 % — AB (ref 35.0–47.0)
HEMOGLOBIN: 9.2 g/dL — AB (ref 12.0–16.0)
LYMPHS ABS: 0.5 10*3/uL — AB (ref 1.0–3.6)
LYMPHS PCT: 16 %
MCH: 32.5 pg (ref 26.0–34.0)
MCHC: 35.9 g/dL (ref 32.0–36.0)
MCV: 90.6 fL (ref 80.0–100.0)
MONO ABS: 0.1 10*3/uL — AB (ref 0.2–0.9)
MONOS PCT: 3 %
NEUTROS ABS: 2.3 10*3/uL (ref 1.4–6.5)
Neutrophils Relative %: 80 %
Platelets: 134 10*3/uL — ABNORMAL LOW (ref 150–440)
RBC: 2.84 MIL/uL — ABNORMAL LOW (ref 3.80–5.20)
RDW: 14.5 % (ref 11.5–14.5)
WBC: 2.9 10*3/uL — ABNORMAL LOW (ref 3.6–11.0)

## 2016-01-26 MED ORDER — POTASSIUM CHLORIDE 10 MEQ/100ML IV SOLN
10.0000 meq | INTRAVENOUS | Status: AC
  Administered 2016-01-26 (×4): 10 meq via INTRAVENOUS
  Filled 2016-01-26 (×4): qty 100

## 2016-01-26 MED ORDER — POTASSIUM CHLORIDE 10 MEQ/50ML IV SOLN
10.0000 meq | INTRAVENOUS | Status: DC | PRN
Start: 2016-01-26 — End: 2016-01-26
  Filled 2016-01-26: qty 50

## 2016-01-26 NOTE — Progress Notes (Signed)
Stacy Murillo   DOB:18-Jan-1956   [id number]    Subjective: slept well last night. Patient tolerated chemotherapy well. No nausea no vomiting. No sores in the mouth. No constipation or diarrhea. No fevers or chills.   Objective:  Vitals:   01/25/16 1941 01/26/16 0550  BP: (!) 131/52 135/60  Pulse: 85 87  Resp:    Temp: 98.5 F (36.9 C) 98.5 F (36.9 C)     Intake/Output Summary (Last 24 hours) at 01/26/16 0744 Last data filed at 01/26/16 0600  Gross per 24 hour  Intake          2458.33 ml  Output             4350 ml  Net         -1891.67 ml    GENERAL Alert, no distress and comfortable.  EYES: no pallor or icterus OROPHARYNX: no thrush or ulceration. NECK: supple, no masses felt LYMPH:  no palpable lymphadenopathy in the cervical, axillary or inguinal regions LUNGS: decreased breath sounds to auscultation at bases and  No wheeze or crackles HEART/CVS: regular rate & rhythm and no murmurs; No lower extremity edema ABDOMEN: abdomen soft, tender  on deep palpation. and normal bowel sounds Musculoskeletal:no cyanosis of digits and no clubbing  PSYCH: alert & oriented x 3 with fluent speech NEURO: no focal motor/sensory deficits SKIN:  no rashes or significant lesions   Labs:  Lab Results  Component Value Date   WBC 2.9 (L) 01/26/2016   HGB 9.2 (L) 01/26/2016   HCT 25.7 (L) 01/26/2016   MCV 90.6 01/26/2016   PLT 134 (L) 01/26/2016   NEUTROABS 2.3 01/26/2016    Lab Results  Component Value Date   NA 142 01/26/2016   K 2.7 (LL) 01/26/2016   CL 95 (L) 01/26/2016   CO2 43 (H) 01/26/2016    Studies:  No results found.  Assessment & Plan:   # Diffuse large B-cell lymphoma- stage IV- currently admitted to the hospital for high-dose methotrexate.  # CNS prophylaxis- with high-dose methotrexate [3.5 g/m]; today # 4 . Patient's methotrexate levels- 1.8.4 [24 hours post]; 0.2 [48 hours post] Cont as per the nomogram/ pharmacy was continued leucovorin grams every 6  hours.  Continue leucovorin rescue until levels < 0.05; pharmacy following.   # Hypokalemia-2.7; extra 40Kcl IVPB today; and continue Supp K dur 40 q day.   # Mild pancytopenia- from MXT- monitor for now.   # Diffuse large B-cell lymphoma status post 5 cycles of R CHOP- excellent response to chemotherapy. We will likely have to R-CHOP #6 chemotherapy next week.   # Recent DVT Eliquis continue the same.   Cammie Sickle, MD 01/26/2016  7:44 AM

## 2016-01-26 NOTE — Progress Notes (Signed)
MEDICATION RELATED CONSULT NOTE - INITIAL   Pharmacy Consult for High- Dose Methotrexate monitoring Indication: Monitoring of methotrexate levels and ordering leucovorin doses.  No Known Allergies  Patient Measurements: Height: 5\' 2"  (157.5 cm) Weight: 165 lb (74.8 kg) IBW/kg (Calculated) : 50.1   Vital Signs: Temp: 98.5 F (36.9 C) (09/13 1941) Temp Source: Oral (09/13 1941) BP: 131/52 (09/13 1941) Pulse Rate: 85 (09/13 1941) Intake/Output from previous day: 09/13 0701 - 09/14 0700 In: 1087.5 [I.V.:1087.5] Out: 3150 [Urine:3150] Intake/Output from this shift: Total I/O In: -  Out: 600 [Urine:600]  Labs:  Recent Labs  01/23/16 0912 01/24/16 0425 01/25/16 0609  WBC 6.3 19.3* 7.3  HGB 11.6* 10.8* 10.4*  HCT 32.9* 30.0* 29.1*  PLT 170 158 154  CREATININE 0.Stacy 0.78 1.00  ALBUMIN 4.1  --   --   PROT 6.4*  --   --   AST 27  --   --   ALT 23  --   --   ALKPHOS 68  --   --   BILITOT 0.6  --   --    Estimated Creatinine Clearance: 56 mL/min (by C-G formula based on SCr of 1 mg/dL).   Microbiology: No results found for this or any previous visit (from the past 720 hour(s)).   Assessment: 60 yo Murillo to start on high dose methotrexate infusion with Leucovorin rescue, hx large B-cell Lymphoma.   Plan:  Per Dr. Rogue Bussing, do not start the methotrexate infusion until urine pH is at 7.0 and maintain> 7.0 during therapy; NaBicarb drip to continue at least 48h after MTX infusion. Per MD, check urine pH at least daily.    Sodium Bicarb Drip:  Goal Urine pH > 7.0 If urine pH less than goal consider increasing NaBicarb drip by 25 ml/hr, or if rate exceeds 175 ml/hr on the current NaBicarb drip, may need to increase bicarb in bag to 150 mEq. Checking urine pH every hour until pH >7.0 then once stabilizes consider checking every 2 hrs then every 4hrs etc . Care order instruction for RN to enter urine pH orders. Continue at least 48 hrs after Methotrexate infusion  finished.  9/12 Urine pH is being maintained between 7 and 9. Will decrease urine pH checks to q4h unless becomes out of range. 9/12 2213 urine pH 9. Continue current rate for now, will recheck pH and adjust if needed to maintain pH 7 to 9. 9/13 2155 urine pH 9. Continue current rate for now, will recheck pH and adjust if needed to maintain pH 7 to 9.  Methotrexate Levels:  Pt needs MTX level ordered at 24h after start of infusion (i.e. 20 h after the end of infusion b/c infusion runs over 4 hrs).  Based on MTX level, leucovorin dose will need to be ordered and adjusted based on the daily MTX level.  Pt will need daily MTX levels and urine pH monitoring until MTX levels are <0.1 microM. (Note: Labs are sent to [facility] so takes 4-6 hrs to get result). Results are faxed? Can also find by going under "Chart Review" then "Media" and look for "Lab result scan".  9/12 1st Methotrexate level to be drawn at 17:45 this evening. Level resulted 1.84 micromol/L   9/13 2nd methotrexate level drawn at 1737 this evening. Level resulted 0.2 micromol/L.  Leucovorin: Follow Infomation for High-dose methotrexate-rescue- Leucovorin dosing (see [email or web address]).  9/12 will initiate Leucovorin rescue with 10mg /m2 IV q6h at 18:00. Will adjust dose based on Methotrexate levels.  9/12  1745 methotrexate level 1.84 micromol/L. Continue with current leucovorin dose of 10 mg/m2 IV Q6H, will recheck methotrexate level at 48 hours post dose.   9/13 1737 methotrexate level 0.2 micromol/L. Continue with current leucovorin dose of 10 mg/m2 IV Q6H, will recheck methotrexate level at 72 hours post dose.  [employer] A. Jordan Hawks, PharmD, BCPS 01/26/2016 12:34 AM

## 2016-01-26 NOTE — Progress Notes (Signed)
MEDICATION RELATED CONSULT NOTE - INITIAL   Pharmacy Consult for High- Dose Methotrexate monitoring Indication: Monitoring of methotrexate levels and ordering leucovorin doses.  No Known Allergies  Patient Measurements: Height: 5' 2.01" (157.5 cm) Weight: 121 lb 4.8 oz (55 kg) IBW/kg (Calculated) : 50.12   Vital Signs: Temp: 98.7 F (37.1 C) (09/14 1934) Temp Source: Oral (09/14 1934) BP: 135/67 (09/14 1934) Pulse Rate: 73 (09/14 1934) Intake/Output from previous day: 09/13 0701 - 09/14 0700 In: 2458.3 [I.V.:2458.3] Out: 4350 [Urine:4350] Intake/Output from this shift: Total I/O In: -  Out: 250 [Urine:250]  Labs:  Recent Labs  01/24/16 0425 01/25/16 0609 01/26/16 0552  WBC 19.3* 7.3 2.9*  HGB 10.8* 10.4* 9.2*  HCT 30.0* 29.1* 25.7*  PLT 158 154 134*  CREATININE 0.78 1.00 0.87   Estimated Creatinine Clearance: 53.7 mL/min (by C-G formula based on SCr of 0.87 mg/dL).   Microbiology: No results found for this or any previous visit (from the past 720 hour(s)).   Assessment: 60 yo female to start on high dose methotrexate infusion with Leucovorin rescue, hx large B-cell Lymphoma.   Plan:  Per Dr. Rogue Bussing, do not start the methotrexate infusion until urine pH is at 7.0 and maintain> 7.0 during therapy; NaBicarb drip to continue at least 48h after MTX infusion. Per MD, check urine pH at least daily.    Sodium Bicarb Drip:  Goal Urine pH > 7.0 If urine pH less than goal consider increasing NaBicarb drip by 25 ml/hr, or if rate exceeds 175 ml/hr on the current NaBicarb drip, may need to increase bicarb in bag to 150 mEq. Checking urine pH every hour until pH >7.0 then once stabilizes consider checking every 2 hrs then every 4hrs etc . Care order instruction for RN to enter urine pH orders. Continue at least 48 hrs after Methotrexate infusion finished.  9/12 Urine pH is being maintained between 7 and 9. Will decrease urine pH checks to q4h unless becomes out of  range. 9/12 2213 urine pH 9. Continue current rate for now, will recheck pH and adjust if needed to maintain pH 7 to 9. 9/13 2155 urine pH 9. Continue current rate for now, will recheck pH and adjust if needed to maintain pH 7 to 9. 9/14 2155 urine pH 9. Continue current rate for now, will recheck pH and adjust if needed to maintain pH 7 to 9.  Methotrexate Levels:  Pt needs MTX level ordered at 24h after start of infusion (i.e. 20 h after the end of infusion b/c infusion runs over 4 hrs).  Based on MTX level, leucovorin dose will need to be ordered and adjusted based on the daily MTX level.  Pt will need daily MTX levels and urine pH monitoring until MTX levels are <0.1 microM. (Note: Labs are sent to [facility] so takes 4-6 hrs to get result). Results are faxed? Can also find by going under "Chart Review" then "Media" and look for "Lab result scan".  9/12 1st Methotrexate level to be drawn at 17:45 this evening. Level resulted 1.84 micromol/L   9/13 2nd methotrexate level drawn at 1737 this evening. Level resulted 0.2 micromol/L.  9/14 3rd methotrexate level drawn at 1739 this evening. Level resulted 0.12 micromol/L.  Leucovorin: Follow Infomation for High-dose methotrexate-rescue- Leucovorin dosing (see [email or web address]).  9/12 will initiate Leucovorin rescue with 10mg /m2 IV q6h at 18:00. Will adjust dose based on Methotrexate levels.  9/12 1745 methotrexate level 1.84 micromol/L. Continue with current leucovorin dose of 10 mg/m2 IV Q6H,  will recheck methotrexate level at 48 hours post dose.   9/13 1737 methotrexate level 0.2 micromol/L. Continue with current leucovorin dose of 10 mg/m2 IV Q6H, will recheck methotrexate level at 72 hours post dose.  9/14 1739 methotrexate level 0.12 micromol/L. Continue with current leucovorin dose of 10 mg/m2 IV Q6H, will recheck methotrexate level at 96 hours post dose.  Brodey Bonn A. Jordan Hawks, PharmD, BCPS 01/26/2016 10:57 PM

## 2016-01-27 LAB — CBC WITH DIFFERENTIAL/PLATELET
BASOS ABS: 0 10*3/uL (ref 0–0.1)
BASOS PCT: 1 %
Eosinophils Absolute: 0 10*3/uL (ref 0–0.7)
Eosinophils Relative: 1 %
HEMATOCRIT: 26.7 % — AB (ref 35.0–47.0)
HEMOGLOBIN: 9.5 g/dL — AB (ref 12.0–16.0)
LYMPHS ABS: 0.4 10*3/uL — AB (ref 1.0–3.6)
LYMPHS PCT: 12 %
MCH: 32.1 pg (ref 26.0–34.0)
MCHC: 35.7 g/dL (ref 32.0–36.0)
MCV: 90 fL (ref 80.0–100.0)
MONO ABS: 0.1 10*3/uL — AB (ref 0.2–0.9)
MONOS PCT: 2 %
Neutro Abs: 2.9 10*3/uL (ref 1.4–6.5)
Neutrophils Relative %: 84 %
Platelets: 141 10*3/uL — ABNORMAL LOW (ref 150–440)
RBC: 2.97 MIL/uL — ABNORMAL LOW (ref 3.80–5.20)
RDW: 14.3 % (ref 11.5–14.5)
WBC: 3.4 10*3/uL — AB (ref 3.6–11.0)

## 2016-01-27 LAB — BASIC METABOLIC PANEL
ANION GAP: 3 — AB (ref 5–15)
BUN: 13 mg/dL (ref 6–20)
CHLORIDE: 105 mmol/L (ref 101–111)
CO2: 36 mmol/L — AB (ref 22–32)
Calcium: 8.7 mg/dL — ABNORMAL LOW (ref 8.9–10.3)
Creatinine, Ser: 0.95 mg/dL (ref 0.44–1.00)
GFR calc non Af Amer: >60 mL/min (ref >60)
GLUCOSE: 93 mg/dL (ref 65–99)
POTASSIUM: 3.3 mmol/L — AB (ref 3.5–5.1)
Sodium: 144 mmol/L (ref 135–145)

## 2016-01-27 LAB — URINE PH
PH: 9 — AB (ref 5.0–8.0)
PH: 9 — AB (ref 5.0–8.0)
PH: 9 — AB (ref 5.0–8.0)
PH: 9 — AB (ref 5.0–8.0)
PH: 9 — AB (ref 5.0–8.0)
pH: 9 — ABNORMAL HIGH (ref 5.0–8.0)

## 2016-01-27 NOTE — Progress Notes (Signed)
Jenasis Gasca [place]   DOB:Feb 15, 1956   [id number]    Subjective: the patient did not sleep well last night because of the beeping from the IV line.  No nausea no vomiting. No sores in the mouth. No constipation or diarrhea. No fevers or chills.   Objective:  Vitals:   01/26/16 1934 01/27/16 0443  BP: 135/67 (!) 143/74  Pulse: 73 66  Resp:    Temp: 98.7 F (37.1 C) 98.4 F (36.9 C)     Intake/Output Summary (Last 24 hours) at 01/27/16 0750 Last data filed at 01/27/16 0354  Gross per 24 hour  Intake          3241.66 ml  Output             2150 ml  Net          1091.66 ml    GENERAL Alert, no distress and comfortable.  EYES: no pallor or icterus OROPHARYNX: no thrush or ulceration. NECK: supple, no masses felt LYMPH:  no palpable lymphadenopathy in the cervical, axillary or inguinal regions LUNGS: decreased breath sounds to auscultation at bases and  No wheeze or crackles HEART/CVS: regular rate & rhythm and no murmurs; No lower extremity edema ABDOMEN: abdomen soft, tender  on deep palpation. and normal bowel sounds Musculoskeletal:no cyanosis of digits and no clubbing  PSYCH: alert & oriented x 3 with fluent speech NEURO: no focal motor/sensory deficits SKIN:  no rashes or significant lesions   Labs:  Lab Results  Component Value Date   WBC 3.4 (L) 01/27/2016   HGB 9.5 (L) 01/27/2016   HCT 26.7 (L) 01/27/2016   MCV 90.0 01/27/2016   PLT 141 (L) 01/27/2016   NEUTROABS 2.9 01/27/2016    Lab Results  Component Value Date   NA 144 01/27/2016   K 3.3 (L) 01/27/2016   CL 105 01/27/2016   CO2 36 (H) 01/27/2016    Studies:  No results found.  Assessment & Plan:   # Diffuse large B-cell lymphoma- stage IV- currently admitted to the hospital for high-dose methotrexate.  # CNS prophylaxis- with high-dose methotrexate [3.5 g/m]; today # 5 . Patient's methotrexate levels- 1.8.4 [24 hours post]; 0.2 [48 hours post]; 0.12 [96 post]  Cont as per the nomogram/ pharmacy -  continue leucovorin grams every 6 hours.  Continue leucovorin rescue until levels < 0.05; pharmacy following.   # Hypokalemia-3.3;  and continue Supp K dur 40 q day.   # Mild pancytopenia- from MXT- monitor for now.   # Diffuse large B-cell lymphoma status post 5 cycles of R CHOP- excellent response to chemotherapy. We will likely have to R-CHOP #6 chemotherapy next week.   # Recent DVT Eliquis continue the same.   # anticipate discharge tomorrow.    Cammie Sickle, MD 01/27/2016  7:50 AM

## 2016-01-27 NOTE — Progress Notes (Signed)
MEDICATION RELATED CONSULT NOTE - INITIAL   Pharmacy Consult for High- Dose Methotrexate monitoring Indication: Monitoring of methotrexate levels and ordering leucovorin doses.  No Known Allergies  Patient Measurements: Height: 5' 2.01" (157.5 cm) Weight: 121 lb 4.8 oz (55 kg) IBW/kg (Calculated) : 50.12   Vital Signs: Temp: 98.9 F (37.2 C) (09/15 2037) Temp Source: Oral (09/15 2037) BP: 122/66 (09/15 2037) Pulse Rate: 73 (09/15 2037) Intake/Output from previous day: 09/14 0701 - 09/15 0700 In: 3241.7 [I.V.:3241.7] Out: 2150 [Urine:2150] Intake/Output from this shift: No intake/output data recorded.  Labs:  Recent Labs  01/25/16 0609 01/26/16 0552 01/27/16 0500  WBC 7.3 2.9* 3.4*  HGB 10.4* 9.2* 9.5*  HCT 29.1* 25.7* 26.7*  PLT 154 134* 141*  CREATININE 1.00 0.87 0.95   Estimated Creatinine Clearance: 49.2 mL/min (by C-G formula based on SCr of 0.95 mg/dL).   Microbiology: No results found for this or any previous visit (from the past 720 hour(s)).   Assessment: 60 yo female to start on high dose methotrexate infusion with Leucovorin rescue, hx large B-cell Lymphoma.   Plan:  Per Dr. Rogue Bussing, do not start the methotrexate infusion until urine pH is at 7.0 and maintain> 7.0 during therapy; NaBicarb drip to continue at least 48h after MTX infusion. Per MD, check urine pH at least daily.    Sodium Bicarb Drip:  Goal Urine pH > 7.0 If urine pH less than goal consider increasing NaBicarb drip by 25 ml/hr, or if rate exceeds 175 ml/hr on the current NaBicarb drip, may need to increase bicarb in bag to 150 mEq. Checking urine pH every hour until pH >7.0 then once stabilizes consider checking every 2 hrs then every 4hrs etc . Care order instruction for RN to enter urine pH orders. Continue at least 48 hrs after Methotrexate infusion finished.  9/12 Urine pH is being maintained between 7 and 9. Will decrease urine pH checks to q4h unless becomes out of  range. 9/12 2213 urine pH 9. Continue current rate for now, will recheck pH and adjust if needed to maintain pH 7 to 9. 9/13 2155 urine pH 9. Continue current rate for now, will recheck pH and adjust if needed to maintain pH 7 to 9. 9/14 2155 urine pH 9. Continue current rate for now, will recheck pH and adjust if needed to maintain pH 7 to 9. 9/15 2215 urine pH 9. Continue current rate for now, will recheck pH and adjust if needed to maintain pH 7 to 9.  Methotrexate Levels:  Pt needs MTX level ordered at 24h after start of infusion (i.e. 20 h after the end of infusion b/c infusion runs over 4 hrs).  Based on MTX level, leucovorin dose will need to be ordered and adjusted based on the daily MTX level.  Pt will need daily MTX levels and urine pH monitoring until MTX levels are <0.1 microM. (Note: Labs are sent to [facility] so takes 4-6 hrs to get result). Results are faxed? Can also find by going under "Chart Review" then "Media" and look for "Lab result scan".  9/12 1st Methotrexate level to be drawn at 17:45 this evening. Level resulted 1.84 micromol/L   9/13 2nd methotrexate level drawn at 1737 this evening. Level resulted 0.2 micromol/L.  9/14 3rd methotrexate level drawn at 1739 this evening. Level resulted 0.12 micromol/L.  9/15 4th methotrexate level drawn at 1748 this evening. Level resulted 0.08 micromol/L.  Leucovorin: Follow Infomation for High-dose methotrexate-rescue- Leucovorin dosing (see [email or web address]).  9/12 will  initiate Leucovorin rescue with 10mg /m2 IV q6h at 18:00. Will adjust dose based on Methotrexate levels.  9/12 1745 methotrexate level 1.84 micromol/L. Continue with current leucovorin dose of 10 mg/m2 IV Q6H, will recheck methotrexate level at 48 hours post dose.   9/13 1737 methotrexate level 0.2 micromol/L. Continue with current leucovorin dose of 10 mg/m2 IV Q6H, will recheck methotrexate level at 72 hours post dose.  9/14 1739 methotrexate level 0.12 micromol/L.  Continue with current leucovorin dose of 10 mg/m2 IV Q6H, will recheck methotrexate level at 96 hours post dose.  9/15 1748 methotrexate level 0.08 micromol/L. Continue with current leucovorin dose of 10 mg/m2 IV Q6H, will recheck methotrexate level at 120 hours post dose.  Cacey Willow A. Jordan Hawks, PharmD, BCPS 01/27/2016 11:41 PM

## 2016-01-28 LAB — CBC WITH DIFFERENTIAL/PLATELET
Basophils Absolute: 0 10*3/uL (ref 0–0.1)
Basophils Relative: 0 %
EOS PCT: 1 %
Eosinophils Absolute: 0 10*3/uL (ref 0–0.7)
HEMATOCRIT: 25 % — AB (ref 35.0–47.0)
Hemoglobin: 9 g/dL — ABNORMAL LOW (ref 12.0–16.0)
LYMPHS ABS: 0.5 10*3/uL — AB (ref 1.0–3.6)
LYMPHS PCT: 14 %
MCH: 32.4 pg (ref 26.0–34.0)
MCHC: 35.9 g/dL (ref 32.0–36.0)
MCV: 90.1 fL (ref 80.0–100.0)
MONO ABS: 0.1 10*3/uL — AB (ref 0.2–0.9)
Monocytes Relative: 3 %
NEUTROS ABS: 2.6 10*3/uL (ref 1.4–6.5)
Neutrophils Relative %: 82 %
PLATELETS: 134 10*3/uL — AB (ref 150–440)
RBC: 2.77 MIL/uL — AB (ref 3.80–5.20)
RDW: 13.7 % (ref 11.5–14.5)
WBC: 3.2 10*3/uL — ABNORMAL LOW (ref 3.6–11.0)

## 2016-01-28 LAB — BASIC METABOLIC PANEL
Anion gap: 3 — ABNORMAL LOW (ref 5–15)
BUN: 10 mg/dL (ref 6–20)
CO2: 34 mmol/L — AB (ref 22–32)
Calcium: 8.4 mg/dL — ABNORMAL LOW (ref 8.9–10.3)
Chloride: 105 mmol/L (ref 101–111)
Creatinine, Ser: 0.91 mg/dL (ref 0.44–1.00)
GFR calc Af Amer: >60 mL/min (ref >60)
GLUCOSE: 94 mg/dL (ref 65–99)
POTASSIUM: 3.4 mmol/L — AB (ref 3.5–5.1)
Sodium: 142 mmol/L (ref 135–145)

## 2016-01-28 LAB — URINE PH
pH: 9 — ABNORMAL HIGH (ref 5.0–8.0)
pH: 9 — ABNORMAL HIGH (ref 5.0–8.0)

## 2016-01-28 MED ORDER — HEPARIN SOD (PORK) LOCK FLUSH 100 UNIT/ML IV SOLN
500.0000 [IU] | Freq: Once | INTRAVENOUS | Status: AC
  Administered 2016-01-28: 500 [IU] via INTRAVENOUS
  Filled 2016-01-28: qty 5

## 2016-01-28 NOTE — Discharge Summary (Signed)
[facility] [place] NAME: Stacy Murillo    MR#:  [id number]  DATE OF BIRTH:  1955-08-29  DATE OF ADMISSION:  01/23/2016 ADMITTING PHYSICIAN: Cammie Sickle, MD  DATE OF DISCHARGE: 01/28/2016  PRIMARY CARE PHYSICIAN: Rica Mast, MD    ADMISSION DIAGNOSIS:  high dose methotrexate  DISCHARGE DIAGNOSIS:  Active Problems:   Lymphoma, large-cell, diffuse (HCC)   SECONDARY DIAGNOSIS:   Past Medical History:  Diagnosis Date  . Arthritis   . Blood dyscrasia   . Cancer ([place])   . Chest pain, unspecified   . Diverticulosis 07/06/15  . Dysrhythmia   . Esophagitis 07/06/15  . Fatigue   . Gastritis 07/06/15  . GERD (gastroesophageal reflux disease)   . 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 Leg cramps   . Lymphoma ([place]) 09/14/15   Monoclonal B cell lymphoma  . Night sweats   . Osteoporosis   . Overweight(278.02)    Obesity  . Palpitations   . Shortness of breath dyspnea   . Tachycardia   . Thrombocytopenia ([place])     HOSPITAL COURSE:   Pt received high dose methotrexate as per protocol  DISCHARGE CONDITIONS:   stable  CONSULTS OBTAINED:   none DRUG ALLERGIES:  No Known Allergies  DISCHARGE MEDICATIONS:   Current Discharge Medication List    CONTINUE these medications which have NOT CHANGED   Details  acyclovir (ZOVIRAX) 400 MG tablet Take 1 tablet (400 mg total) by mouth 2 (two) times daily. Qty: 60 tablet, Refills: 3    apixaban (ELIQUIS) 5 MG TABS tablet Take 1 tablet (5 mg total) by mouth 2 (two) times daily. 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 Qty: 30 tablet, Refills: 0    lidocaine-prilocaine (EMLA) cream Apply 1 application topically as needed  (prior to accessing port).    ondansetron (ZOFRAN) 8 MG tablet Take 8 mg by mouth every 8 (eight) hours as needed for nausea or vomiting.    oxyCODONE-acetaminophen (PERCOCET) 7.5-325 MG tablet Refills: 0   Associated Diagnoses: 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)    predniSONE (DELTASONE) 50 MG tablet TAKE TWO TABLETS ONCE A DAY FOR FIVE DAYS. START ON DAY OF YOUR CHEMOTHERAPY.TAKE WITH FOOD. Qty: 10 tablet, Refills: 3   Associated Diagnoses: 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)    prochlorperazine (COMPAZINE) 10 MG tablet Take 1 tablet (10 mg total) by mouth every 6 (six) hours as needed for nausea or vomiting. Qty: 40 tablet, Refills: 1   Associated Diagnoses: 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[place]); Dehydration; Nausea without vomiting    ranitidine (ZANTAC) 150 MG tablet Take 150 mg by mouth 2 (two) times daily.       STOP taking these medications     diazepam (VALIUM) 5 MG tablet          DISCHARGE INSTRUCTIONS:    DIET:  Regular diet  DISCHARGE CONDITION:  Good  ACTIVITY:  Activity as tolerated  OXYGEN:  Home Oxygen: No.   Oxygen Delivery: room air  DISCHARGE LOCATION:  home   If you experience worsening of your admission symptoms, develop shortness of breath, life threatening emergency, suicidal or homicidal thoughts you must seek medical  attention immediately by calling 911 or calling your MD immediately  if symptoms less severe.  You Must read complete instructions/literature along with all the possible adverse reactions/side effects for all the Medicines you take and that have been prescribed to you. Take any new Medicines after you have completely understood and accpet all the possible adverse reactions/side effects.   Please note  You were cared for by a hospitalist during your hospital stay. If you have any questions about your discharge medications or the care you received while you were in  the hospital after you are discharged, you can call the unit and asked to speak with the hospitalist on call if the hospitalist that took care of you is not available. Once you are discharged, your primary care physician will handle any further medical issues. Please note that NO REFILLS for any discharge medications will be authorized once you are discharged, as it is imperative that you return to your primary care physician (or establish a relationship with a primary care physician if you do not have one) for your aftercare needs so that they can reassess your need for medications and monitor your lab values.    On the day of Discharge:  VITAL SIGNS:  Blood pressure (!) 144/49, pulse (!) 59, temperature 98.3 F (36.8 C), temperature source Oral, resp. rate 14, height 5' 2.01" (1.575 m), weight 121 lb 4.8 oz (55 kg), SpO2 100 %.  PHYSICAL EXAMINATION:  GENERAL:  60 y.o.-year-old patient lying in the bed with no acute distress.  EYES: Pupils equal, round, reactive to light and accommodation. No scleral icterus. Left eye erythema noted.  Extraocular muscles intact.  HEENT: Head atraumatic, normocephalic. Oropharynx and nasopharynx clear.  NECK:  Supple, no jugular venous distention. No thyroid enlargement, no tenderness.  LUNGS: Normal breath sounds bilaterally, no wheezing, rales,rhonchi or crepitation. No use of accessory muscles of respiration.  CARDIOVASCULAR: S1, S2 normal. No murmurs, rubs, or gallops.  ABDOMEN: Soft, non-tender, non-distended. Bowel sounds present. No organomegaly or mass.  EXTREMITIES: No pedal edema, cyanosis, or clubbing.  NEUROLOGIC: Cranial nerves II through XII are intact. Muscle strength 5/5 in all extremities. Sensation intact. Gait not checked.  PSYCHIATRIC: The patient is alert and oriented x 3.  SKIN: No obvious rash, lesion, or ulcer.  DATA REVIEW:   CBC  Recent Labs Lab 01/28/16 0543  WBC 3.2*  HGB 9.0*  HCT 25.0*  PLT 134*    Chemistries    Recent Labs Lab 01/23/16 0912  01/28/16 0543  NA 138  < > 142  K 3.9  < > 3.4*  CL 108  < > 105  CO2 25  < > 34*  GLUCOSE 99  < > 94  BUN 20  < > 10  CREATININE 0.75  < > 0.91  CALCIUM 9.0  < > 8.4*  AST 27  --   --   ALT 23  --   --   ALKPHOS 68  --   --   BILITOT 0.6  --   --   < > = values in this interval not displayed.  Cardiac Enzymes No results for input(s): TROPONINI in the last 168 hours.  Microbiology Results  Results for orders placed or performed during the hospital encounter of 10/19/15  Urine culture     Status: Abnormal   Collection Time: 10/19/15 11:32 AM  Result Value Ref Range Status   Specimen Description URINE, RANDOM  Final   Special Requests NONE  Final  Culture MULTIPLE SPECIES PRESENT, SUGGEST RECOLLECTION (A)  Final   Report Status 10/20/2015 FINAL  Final  Blood culture (routine x 2)     Status: None   Collection Time: 10/19/15 11:32 AM  Result Value Ref Range Status   Specimen Description BLOOD RIGHT AC  Final   Special Requests BOTTLES DRAWN AEROBIC AND ANAEROBIC 14ML  Final   Culture NO GROWTH 5 DAYS  Final   Report Status 10/24/2015 FINAL  Final  Blood culture (routine x 2)     Status: None   Collection Time: 10/19/15 11:32 AM  Result Value Ref Range Status   Specimen Description BLOOD LEFT AC  Final   Special Requests   Final    BOTTLES DRAWN AEROBIC AND ANAEROBIC AER 15ML ANA 13ML   Culture NO GROWTH 5 DAYS  Final   Report Status 10/24/2015 FINAL  Final    RADIOLOGY:  No results found.   Management plans discussed with the patient, family and they are in agreement.  CODE STATUS: full    Code Status Orders        Start     Ordered   01/23/16 1054  Full code  Continuous     01/23/16 1053    Code Status History    Date Active Date Inactive Code Status Order ID Comments User Context   12/05/2015 12:23 PM 12/10/2015  7:37 PM Full Code [id number]  Cammie Sickle, MD Inpatient   10/19/2015  9:33 PM 10/20/2015  4:54 PM Full  Code [id number]  Fritzi Mandes, MD Inpatient      TOTAL TIME TAKING CARE OF THIS PATIENT: 35 minutes.    Cammie Sickle M.D on 01/28/2016 at 9:47 AM  Between 7am to 6pm - Pager - [phone]  After 6pm go to [email or web address] - password [email or web address] [facility]  Office  [phone]  CC: Primary care physician; Rica Mast, MD   Note: This dictation was prepared with Dragon dictation along with smaller phrase technology. Any transcriptional errors that result from this process are unintentional.

## 2016-01-28 NOTE — Final Progress Note (Signed)
Stacy Murillo   DOB:[birth year]   [id number]    Subjective: c/o erythema of left eye; no trauma; no pain. No cough or sob. No sores in mouth. Eager to go home.   Objective:  Vitals:   01/27/16 2037 01/28/16 0448  BP: 122/66 (!) 144/49  Pulse: 73 (!) 59  Resp: 16 14  Temp: 98.9 F (37.2 C) 98.3 F (36.8 C)     Intake/Output Summary (Last 24 hours) at 01/28/16 1001 Last data filed at 01/28/16 0300  Gross per 24 hour  Intake           2887.5 ml  Output             2352 ml  Net            535.5 ml    GENERAL Alert, no distress and comfortable. O2 EYES: no pallor or icterus OROPHARYNX: no thrush or ulceration. NECK: supple, no masses felt LYMPH:  no palpable lymphadenopathy in the cervical, axillary or inguinal regions LUNGS: decreased breath sounds to auscultation at bases and  No wheeze or crackles HEART/CVS: regular rate & rhythm and no murmurs; No lower extremity edema ABDOMEN: abdomen soft, tender  on deep palpation. and normal bowel sounds Musculoskeletal:no cyanosis of digits and no clubbing  PSYCH: alert & oriented x 3 with fluent speech NEURO: no focal motor/sensory deficits SKIN:  no rashes or significant lesions   Labs:  Lab Results  Component Value Date   WBC 3.2 (L) 01/28/2016   HGB 9.0 (L) 01/28/2016   HCT 25.0 (L) 01/28/2016   MCV 90.1 01/28/2016   PLT 134 (L) 01/28/2016   NEUTROABS 2.6 01/28/2016    Lab Results  Component Value Date   NA 142 01/28/2016   K 3.4 (L) 01/28/2016   CL 105 01/28/2016   CO2 34 (H) 01/28/2016    Studies:  No results found.  Assessment & Plan:   # Diffuse large B-cell lymphoma- stage IV- currently admitted to the hospital for high-dose methotrexate.  # CNS prophylaxis- with high-dose methotrexate [3.5 g/m]; today # 5 . Patient's methotrexate levels- 1.8.4 [24 hours post]; 0.2 [48 hours post]; 0.12 [96 post]; 0.08 [post 120 hours]  .   # Hypokalemia-3.3; should improved  # Mild pancytopenia- from MXT- monitor  for now.   # Diffuse large B-cell lymphoma status post 5 cycles of R CHOP- excellent response to chemotherapy. We will likely have to R-CHOP #6 chemotherapy next week.   # Recent DVT Eliquis continue the same.   # discharge today.    Cammie Sickle, MD 01/28/2016  10:01 AM

## 2016-01-28 NOTE — Discharge Summary (Signed)
[facility] [place] NAME: Stacy Murillo    MR#:  [id number]  DATE OF BIRTH:  [birth year]  DATE OF ADMISSION:  01/23/2016 ADMITTING PHYSICIAN: Cammie Sickle, MD  DATE OF DISCHARGE: 01/28/2016  PRIMARY CARE PHYSICIAN: Rica Mast, MD    ADMISSION DIAGNOSIS:  high dose methotrexate  DISCHARGE DIAGNOSIS:  Active Problems:   Lymphoma, large-cell, diffuse (HCC)   SECONDARY DIAGNOSIS:   Past Medical History:  Diagnosis Date  . Arthritis   . Blood dyscrasia   . Cancer ([place])   . Chest pain, unspecified   . Diverticulosis 07/06/15  . Dysrhythmia   . Esophagitis 07/06/15  . Fatigue   . Gastritis 07/06/15  . GERD (gastroesophageal reflux disease)   . 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 Leg cramps   . Lymphoma ([place]) 09/14/15   Monoclonal B cell lymphoma  . Night sweats   . Osteoporosis   . Overweight(278.02)    Obesity  . Palpitations   . Shortness of breath dyspnea   . Tachycardia   . Thrombocytopenia [facility])     HOSPITAL COURSE:   HPI: A 60 year old female patient with history of diffuse large B-cell lymphoma- with involvement of the peripheral blood status post 3 cycles of R CHOP was admitted to the hospital for high-dose methotrexate for CNS prophylaxis.  # CNS prophylaxis- with high-dose methotrexate [3.5 g/m];  reveived as per protocol.  # Hypokalemia sec to Bicarb infusion- /s/p potassium supplementation 20 meq 3 times a day  # Mild pancytopenia-likely secondary to chemotherapy.  # Diffuse large B-cell lymphoma status post 3 cycles of R CHOP- excellent response to chemotherapy. Plan R CHOP cycle #6  next week.   # Recent DVT Eliquis continue the same.  DISCHARGE CONDITIONS:   stable  CONSULTS OBTAINED:    DRUG ALLERGIES:  No Known Allergies  DISCHARGE MEDICATIONS:   Current Discharge Medication List    CONTINUE these medications which have NOT CHANGED   Details  acyclovir (ZOVIRAX) 400  MG tablet Take 1 tablet (400 mg total) by mouth 2 (two) times daily. Qty: 60 tablet, Refills: 3    apixaban (ELIQUIS) 5 MG TABS tablet Take 1 tablet (5 mg total) by mouth 2 (two) times daily. From 10/27/15 Qty: 60 tablet, Refills: 3   Associated Diagnoses: 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[place]); History of DVT (deep vein thrombosis)    cyclobenzaprine (FLEXERIL) 5 MG tablet Take 1 tablet (5 mg total) by mouth 3 (three) times daily as needed for muscle spasms. Qty: 30 tablet, Refills: 0    lidocaine-prilocaine (EMLA) cream Apply 1 application topically as needed (prior to accessing port).    ondansetron (ZOFRAN) 8 MG tablet Take 8 mg by mouth every 8 (eight) hours as needed for nausea or vomiting.    oxyCODONE-acetaminophen (PERCOCET) 7.5-325 MG tablet Refills: 0   Associated Diagnoses: 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)    predniSONE (DELTASONE) 50 MG tablet TAKE TWO TABLETS ONCE A DAY FOR FIVE DAYS. START ON DAY OF YOUR CHEMOTHERAPY.TAKE WITH FOOD. Qty: 10 tablet, Refills: 3   Associated Diagnoses: 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)    prochlorperazine (COMPAZINE) 10 MG tablet Take 1 tablet (10 mg total) by mouth every 6 (six) hours as needed for nausea or vomiting. Qty: 40 tablet, Refills: 1   Associated Diagnoses: 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[place]); Dehydration; Nausea without vomiting    ranitidine (ZANTAC) 150 MG tablet  Take 150 mg by mouth 2 (two) times daily.       STOP taking these medications     diazepam (VALIUM) 5 MG tablet          DISCHARGE INSTRUCTIONS:    DIET:  Regular diet  DISCHARGE CONDITION:  Good  ACTIVITY:  Activity as tolerated  OXYGEN:  Home Oxygen: No.   Oxygen Delivery: room air  DISCHARGE LOCATION:  home   If you experience worsening of your admission symptoms, develop shortness of breath, life threatening emergency, suicidal or homicidal thoughts you  must seek medical attention immediately by calling 911 or calling your MD immediately  if symptoms less severe.  You Must read complete instructions/literature along with all the possible adverse reactions/side effects for all the Medicines you take and that have been prescribed to you. Take any new Medicines after you have completely understood and accpet all the possible adverse reactions/side effects.   Please note  You were cared for by a hospitalist during your hospital stay. If you have any questions about your discharge medications or the care you received while you were in the hospital after you are discharged, you can call the unit and asked to speak with the hospitalist on call if the hospitalist that took care of you is not available. Once you are discharged, your primary care physician will handle any further medical issues. Please note that NO REFILLS for any discharge medications will be authorized once you are discharged, as it is imperative that you return to your primary care physician (or establish a relationship with a primary care physician if you do not have one) for your aftercare needs so that they can reassess your need for medications and monitor your lab values.    On the day of Discharge:  VITAL SIGNS:  Blood pressure (!) 144/49, pulse (!) 59, temperature 98.3 F (36.8 C), temperature source Oral, resp. rate 14, height 5' 2.01" (1.575 m), weight 121 lb 4.8 oz (55 kg), SpO2 100 %.  PHYSICAL EXAMINATION:  GENERAL:  60 y.o.-year-old patient lying in the bed with no acute distress.  EYES: Pupils equal, round, reactive to light and accommodation. No scleral icterus. Extraocular muscles intact.  HEENT: Head atraumatic, normocephalic. Oropharynx and nasopharynx clear.  NECK:  Supple, no jugular venous distention. No thyroid enlargement, no tenderness.  LUNGS: Normal breath sounds bilaterally, no wheezing, rales,rhonchi or crepitation. No use of accessory muscles of  respiration.  CARDIOVASCULAR: S1, S2 normal. No murmurs, rubs, or gallops.  ABDOMEN: Soft, non-tender, non-distended. Bowel sounds present. No organomegaly or mass.  EXTREMITIES: No pedal edema, cyanosis, or clubbing.  NEUROLOGIC: Cranial nerves II through XII are intact. Muscle strength 5/5 in all extremities. Sensation intact. Gait not checked.  PSYCHIATRIC: The patient is alert and oriented x 3.  SKIN: No obvious rash, lesion, or ulcer.  DATA REVIEW:   CBC  Recent Labs Lab 01/28/16 0543  WBC 3.2*  HGB 9.0*  HCT 25.0*  PLT 134*    Chemistries   Recent Labs Lab 01/23/16 0912  01/28/16 0543  NA 138  < > 142  K 3.9  < > 3.4*  CL 108  < > 105  CO2 25  < > 34*  GLUCOSE 99  < > 94  BUN 20  < > 10  CREATININE 0.75  < > 0.91  CALCIUM 9.0  < > 8.4*  AST 27  --   --   ALT 23  --   --   ALKPHOS  68  --   --   BILITOT 0.6  --   --   < > = values in this interval not displayed.  Cardiac Enzymes No results for input(s): TROPONINI in the last 168 hours.  Microbiology Results  Results for orders placed or performed during the hospital encounter of 10/19/15  Urine culture     Status: Abnormal   Collection Time: 10/19/15 11:32 AM  Result Value Ref Range Status   Specimen Description URINE, RANDOM  Final   Special Requests NONE  Final   Culture MULTIPLE SPECIES PRESENT, SUGGEST RECOLLECTION (A)  Final   Report Status 10/20/2015 FINAL  Final  Blood culture (routine x 2)     Status: None   Collection Time: 10/19/15 11:32 AM  Result Value Ref Range Status   Specimen Description BLOOD RIGHT AC  Final   Special Requests BOTTLES DRAWN AEROBIC AND ANAEROBIC 14ML  Final   Culture NO GROWTH 5 DAYS  Final   Report Status 10/24/2015 FINAL  Final  Blood culture (routine x 2)     Status: None   Collection Time: 10/19/15 11:32 AM  Result Value Ref Range Status   Specimen Description BLOOD LEFT AC  Final   Special Requests   Final    BOTTLES DRAWN AEROBIC AND ANAEROBIC AER 15ML ANA  13ML   Culture NO GROWTH 5 DAYS  Final   Report Status 10/24/2015 FINAL  Final    RADIOLOGY:  No results found.   Management plans discussed with the patient, family and they are in agreement.  CODE STATUS:     Code Status Orders        Start     Ordered   01/23/16 1054  Full code  Continuous     01/23/16 1053    Code Status History    Date Active Date Inactive Code Status Order ID Comments User Context   12/05/2015 12:23 PM 12/10/2015  7:37 PM Full Code [id number]  Cammie Sickle, MD Inpatient   10/19/2015  9:33 PM 10/20/2015  4:54 PM Full Code [id number]  Fritzi Mandes, MD Inpatient      TOTAL TIME TAKING CARE OF THIS PATIENT: 35 minutes.    Cammie Sickle M.D on 01/28/2016 at 10:18 AM    CC: Primary care physician; Rica Mast, MD   Note: This dictation was prepared with Dragon dictation along with smaller phrase technology. Any transcriptional errors that result from this process are unintentional.

## 2016-01-28 NOTE — Discharge Instructions (Signed)
Chemotherapy  Chemotherapy is the use of medicines to stop or slow the growth of cancer cells. Depending on the type and stage of your cancer, you may have chemotherapy to:   Cure your cancer.   Slow the progression of your cancer.   Ease your cancer symptoms.   Improve the benefits of radiation treatment.   Shrink a tumor before surgery.   Rid the body of cancer cells that remain after a tumor is surgically removed.  HOW IS CHEMOTHERAPY GIVEN?  Chemotherapy may be given:   By mouth in liquid or pill form.   Through a thin tube that is inserted into a vein or artery.   By getting a shot.   By rubbing a cream or ointment on your skin.   Through liquids that are placed directly into various areas of the body, such as the abdomen, chest, or bladder.  HOW OFTEN IS CHEMOTHERAPY GIVEN?  Chemotherapy may be given continuously over time, or it may be given in cycles. For example, you may take the medicine for one week out of every month.  FOR HOW LONG WILL I NEED CHEMOTHERAPY TREATMENTS?  The length of treatment depends on many factors, including:   The type of cancer.   Whether the cancer has spread.   How you respond to the chemotherapy.   Whether you develop side effects.  Some types of chemotherapy medicine are given only one time. Others are given for months, years, or for life.  WHAT SAFETY PRECAUTIONS MUST I TAKE WHILE ON CHEMOTHERAPY?  Chemotherapy medicines are very strong. They will be in all of your bodily fluids, including your urine, stool, saliva, sweat, tears, vaginal secretions, and semen. You must carefully follow some safety precautions to prevent harm to others while you are using these medicines. Here are some recommended precautions:   Make sure that people who help care for you wear disposable gloves if they are going to come into contact with any of your bodily fluids. Women who are pregnant or breastfeeding should not handle any of your bodily fluids.   Wash any clothes, towels, and  linens that may have your bodily fluids on them twice in a washing machine using very hot water.   Dispose of adult diapers, tampons, and sanitary napkins by first sealing them in a plastic bag.   Use a condom when having sex for at least 2 weeks after receiving your chemotherapy.   Do not share beverages or food.   Keep your chemotherapy medicines in their original bottles. Keep them in a high, safe location, away from children. Do not expose them to heat or moisture. Do not put them in containers with other types of medicines.   Dispose of all wrappers for your chemotherapy medicines by sealing them in a separate plastic bag.   Do not throw away extra medicine, and do not flush it down the toilet. Take medicine that you are not going to use to your health care provider's office where it can be disposed of properly.   Follow your health care provider's directions for the proper disposal of needles, IV tubing, and other medical supplies that have come into contact with your chemotherapy medicines.   If you are issued a hazardous waste container, make sure you understand the directions for using it.   Wash your hands thoroughly with warm water and soap after using the bathroom. Dry your hands with disposable paper towels.   When using the toilet:    Flush it   twice after each use, including after vomiting.    Close the lid of the toilet prior to flushing. This helps to avoid splashing.    Both men and women should sit to use the toilet. This helps avoid splashing.  WHAT ARE THE SIDE EFFECTS OF CHEMOTHERAPY?  Side effects depend on a variety of factors, including:   The specific type of chemotherapy medicine used.   The dosage.   How long the medicine is used for.   Your overall health.  Some of the side effects you may experience include:   Fatigue and decreased energy.   Decreased appetite.   Changes in your sense of smell or taste.   Nausea.   Vomiting.   Constipation or diarrhea.   Hair  loss.   Increased susceptibility to infection.   Easy bleeding.   Mouth sores.   Burning or tingling in the hands or feet.   Memory problems.     This information is not intended to replace advice given to you by your health care provider. Make sure you discuss any questions you have with your health care provider.     Document Released: 02/25/2007 Document Revised: 05/21/2014 Document Reviewed: 10/06/2013  Elsevier Interactive Patient Education 2016 [employer]

## 2016-01-29 NOTE — Assessment & Plan Note (Signed)
We will proceed with hospitalization for inpatient methotrexate today.

## 2016-02-01 ENCOUNTER — Inpatient Hospital Stay: Payer: BLUE CROSS/BLUE SHIELD

## 2016-02-01 DIAGNOSIS — C8333 Diffuse large B-cell lymphoma, intra-abdominal lymph nodes: Secondary | ICD-10-CM | POA: Diagnosis not present

## 2016-02-01 DIAGNOSIS — E669 Obesity, unspecified: Secondary | ICD-10-CM | POA: Diagnosis not present

## 2016-02-01 DIAGNOSIS — Z79899 Other long term (current) drug therapy: Secondary | ICD-10-CM | POA: Diagnosis not present

## 2016-02-01 DIAGNOSIS — C8583 Other specified types of non-Hodgkin lymphoma, intra-abdominal lymph nodes: Secondary | ICD-10-CM

## 2016-02-01 DIAGNOSIS — K219 Gastro-esophageal reflux disease without esophagitis: Secondary | ICD-10-CM | POA: Diagnosis not present

## 2016-02-01 DIAGNOSIS — M1990 Unspecified osteoarthritis, unspecified site: Secondary | ICD-10-CM | POA: Diagnosis not present

## 2016-02-01 DIAGNOSIS — Z5111 Encounter for antineoplastic chemotherapy: Secondary | ICD-10-CM | POA: Diagnosis not present

## 2016-02-01 DIAGNOSIS — Z7901 Long term (current) use of anticoagulants: Secondary | ICD-10-CM | POA: Diagnosis not present

## 2016-02-01 DIAGNOSIS — Z86718 Personal history of other venous thrombosis and embolism: Secondary | ICD-10-CM | POA: Diagnosis not present

## 2016-02-01 DIAGNOSIS — C8299 Follicular lymphoma, unspecified, extranodal and solid organ sites: Secondary | ICD-10-CM

## 2016-02-01 LAB — CBC WITH DIFFERENTIAL/PLATELET
Basophils Absolute: 0 10*3/uL (ref 0–0.1)
Basophils Relative: 1 %
Eosinophils Absolute: 0.1 10*3/uL (ref 0–0.7)
Eosinophils Relative: 1 %
HCT: 28.7 % — ABNORMAL LOW (ref 35.0–47.0)
HEMOGLOBIN: 10.1 g/dL — AB (ref 12.0–16.0)
LYMPHS ABS: 0.5 10*3/uL — AB (ref 1.0–3.6)
LYMPHS PCT: 10 %
MCH: 32 pg (ref 26.0–34.0)
MCHC: 35.3 g/dL (ref 32.0–36.0)
MCV: 90.8 fL (ref 80.0–100.0)
Monocytes Absolute: 0.6 10*3/uL (ref 0.2–0.9)
Monocytes Relative: 11 %
NEUTROS ABS: 4 10*3/uL (ref 1.4–6.5)
NEUTROS PCT: 77 %
Platelets: 126 10*3/uL — ABNORMAL LOW (ref 150–440)
RBC: 3.17 MIL/uL — AB (ref 3.80–5.20)
RDW: 13.9 % (ref 11.5–14.5)
WBC: 5.2 10*3/uL (ref 3.6–11.0)

## 2016-02-01 LAB — BASIC METABOLIC PANEL
Anion gap: 4 — ABNORMAL LOW (ref 5–15)
BUN: 18 mg/dL (ref 6–20)
CHLORIDE: 108 mmol/L (ref 101–111)
CO2: 27 mmol/L (ref 22–32)
Calcium: 9 mg/dL (ref 8.9–10.3)
Creatinine, Ser: 0.89 mg/dL (ref 0.44–1.00)
GFR calc non Af Amer: >60 mL/min (ref >60)
Glucose, Bld: 92 mg/dL (ref 65–99)
POTASSIUM: 4.4 mmol/L (ref 3.5–5.1)
SODIUM: 139 mmol/L (ref 135–145)

## 2016-02-03 ENCOUNTER — Inpatient Hospital Stay (HOSPITAL_BASED_OUTPATIENT_CLINIC_OR_DEPARTMENT_OTHER): Payer: BLUE CROSS/BLUE SHIELD | Admitting: Internal Medicine

## 2016-02-03 ENCOUNTER — Inpatient Hospital Stay: Payer: BLUE CROSS/BLUE SHIELD

## 2016-02-03 ENCOUNTER — Encounter: Payer: Self-pay | Admitting: Internal Medicine

## 2016-02-03 VITALS — BP 140/84 | HR 67 | Temp 95.6°F | Resp 17 | Ht 62.0 in | Wt 166.6 lb

## 2016-02-03 DIAGNOSIS — C8333 Diffuse large B-cell lymphoma, intra-abdominal lymph nodes: Secondary | ICD-10-CM

## 2016-02-03 DIAGNOSIS — Z79899 Other long term (current) drug therapy: Secondary | ICD-10-CM

## 2016-02-03 DIAGNOSIS — Z86718 Personal history of other venous thrombosis and embolism: Secondary | ICD-10-CM | POA: Diagnosis not present

## 2016-02-03 DIAGNOSIS — Z7901 Long term (current) use of anticoagulants: Secondary | ICD-10-CM

## 2016-02-03 DIAGNOSIS — C8583 Other specified types of non-Hodgkin lymphoma, intra-abdominal lymph nodes: Secondary | ICD-10-CM

## 2016-02-03 LAB — HEPATIC FUNCTION PANEL
ALBUMIN: 4.1 g/dL (ref 3.5–5.0)
ALT: 39 U/L (ref 14–54)
AST: 26 U/L (ref 15–41)
Alkaline Phosphatase: 64 U/L (ref 38–126)
BILIRUBIN TOTAL: 0.9 mg/dL (ref 0.3–1.2)
Bilirubin, Direct: <0.1 mg/dL — ABNORMAL LOW (ref 0.1–0.5)
TOTAL PROTEIN: 6.4 g/dL — AB (ref 6.5–8.1)

## 2016-02-03 MED ORDER — HEPARIN SOD (PORK) LOCK FLUSH 100 UNIT/ML IV SOLN
500.0000 [IU] | Freq: Once | INTRAVENOUS | Status: AC | PRN
  Administered 2016-02-03: 500 [IU]
  Filled 2016-02-03: qty 5

## 2016-02-03 MED ORDER — SODIUM CHLORIDE 0.9 % IV SOLN
375.0000 mg/m2 | Freq: Once | INTRAVENOUS | Status: AC
  Administered 2016-02-03: 700 mg via INTRAVENOUS
  Filled 2016-02-03: qty 50

## 2016-02-03 MED ORDER — SODIUM CHLORIDE 0.9 % IV SOLN
10.0000 mg | Freq: Once | INTRAVENOUS | Status: AC
  Administered 2016-02-03: 10 mg via INTRAVENOUS
  Filled 2016-02-03: qty 1

## 2016-02-03 MED ORDER — VINCRISTINE SULFATE CHEMO INJECTION 1 MG/ML
2.0000 mg | Freq: Once | INTRAVENOUS | Status: AC
  Administered 2016-02-03: 2 mg via INTRAVENOUS
  Filled 2016-02-03: qty 2

## 2016-02-03 MED ORDER — SODIUM CHLORIDE 0.9 % IV SOLN: 375.0000 mg/m2 | Freq: Once | INTRAVENOUS | Status: DC

## 2016-02-03 MED ORDER — SODIUM CHLORIDE 0.9 % IV SOLN
Freq: Once | INTRAVENOUS | Status: AC
  Administered 2016-02-03: 11:00:00 via INTRAVENOUS
  Filled 2016-02-03: qty 1000

## 2016-02-03 MED ORDER — SODIUM CHLORIDE 0.9% FLUSH
10.0000 mL | INTRAVENOUS | Status: DC | PRN
  Administered 2016-02-03: 10 mL
  Filled 2016-02-03: qty 10

## 2016-02-03 MED ORDER — ACETAMINOPHEN 325 MG PO TABS
650.0000 mg | ORAL_TABLET | Freq: Once | ORAL | Status: AC
  Administered 2016-02-03: 650 mg via ORAL
  Filled 2016-02-03: qty 2

## 2016-02-03 MED ORDER — SODIUM CHLORIDE 0.9 % IV SOLN
1400.0000 mg | Freq: Once | INTRAVENOUS | Status: AC
  Administered 2016-02-03: 1400 mg via INTRAVENOUS
  Filled 2016-02-03: qty 50

## 2016-02-03 MED ORDER — PALONOSETRON HCL INJECTION 0.25 MG/5ML
0.2500 mg | Freq: Once | INTRAVENOUS | Status: AC
  Administered 2016-02-03: 0.25 mg via INTRAVENOUS
  Filled 2016-02-03: qty 5

## 2016-02-03 MED ORDER — PEGFILGRASTIM 6 MG/0.6ML ~~LOC~~ PSKT
6.0000 mg | PREFILLED_SYRINGE | Freq: Once | SUBCUTANEOUS | Status: AC
  Administered 2016-02-03: 6 mg via SUBCUTANEOUS
  Filled 2016-02-03: qty 0.6

## 2016-02-03 MED ORDER — DIPHENHYDRAMINE HCL 25 MG PO CAPS
50.0000 mg | ORAL_CAPSULE | Freq: Once | ORAL | Status: AC
  Administered 2016-02-03: 50 mg via ORAL
  Filled 2016-02-03: qty 2

## 2016-02-03 MED ORDER — DOXORUBICIN HCL CHEMO IV INJECTION 2 MG/ML
50.0000 mg/m2 | Freq: Once | INTRAVENOUS | Status: AC
  Administered 2016-02-03: 92 mg via INTRAVENOUS
  Filled 2016-02-03: qty 46

## 2016-02-03 NOTE — Assessment & Plan Note (Signed)
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 PET- significant response noted. S/p 3 cycles; s/p 2 cycles of High dose MXT.   Clinically doing well.   # proceed with cycle # 6 of R-CHOP today; will plan PET after this cycle.    #  Left lower extremity DVT- improved on Eliquis.  # plan PET in 9th oct week/ labs/MD follow up on Oct 16th IT MXT admission.

## 2016-02-03 NOTE — Progress Notes (Signed)
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 CT- 1-2 CM LN subpectoral/medistinal/ retro-peritoneal/pelvic/ Right inguinal LN 1.6cm- Bx- DLBCL- ABC; myc-POS; FISH gene re-arragement-NEG. PET- MULTIPLE LN/ Bone involvement; May 25th- R-CHOP q3 W x2; PET- Excellent Response.   # Lumbar puncture- difficult-spinal headache.   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 Denies any nausea or vomiting. Denies any headaches No bleeding. No nausea no vomiting.  Denies any significant headaches. Appetite is good. Denies any tingling and numbness. No lumps or bumps.  REVIEW OF SYSTEMS:  A complete 10 point  review of system is done which is negative except mentioned above/history of present illness.   PAST MEDICAL HISTORY :  Past Medical History:  Diagnosis Date  . Arthritis   . Blood dyscrasia   . Cancer ([place])   . Chest pain, unspecified   . Diverticulosis 07/06/15  . Dysrhythmia   . Esophagitis 07/06/15  . Fatigue   . Gastritis 07/06/15  . GERD (gastroesophageal reflux disease)   . 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 Leg cramps   . Lymphoma ([place]) 09/14/15   Monoclonal B cell lymphoma  . Night sweats   . Osteoporosis   . Overweight(278.02)    Obesity  . Palpitations   . Shortness of breath dyspnea   . Tachycardia   . Thrombocytopenia ([place])     PAST SURGICAL HISTORY :   Past Surgical History:  Procedure Laterality Date  . ABDOMINAL HYSTERECTOMY  2005  . BONE MARROW BIOPSY  09/14/15  . CHOLECYSTECTOMY  1997  . COLONOSCOPY  07/05/2014  . ESOPHAGOGASTRODUODENOSCOPY  07/05/2014  . INGUINAL LYMPH NODE BIOPSY Right 10/05/2015   Procedure: INGUINAL LYMPH NODE BIOPSY;  Surgeon: Christene Lye, MD;  Location: [facility] ORS;  Service: General;  Laterality: Right;  . PERIPHERAL VASCULAR CATHETERIZATION N/A 09/27/2015   Procedure: Glori Luis Cath Insertion;  Surgeon: Algernon Huxley, MD;  Location: [place] CV LAB;  Service: Cardiovascular;  Laterality: N/A;  . PORTACATH PLACEMENT Right     FAMILY HISTORY :   Family History  Problem Relation Age of Onset  . Heart disease Father     CABG x 4  . Hypertension Mother   . Pancreatic cancer Mother   . Ulcers  Mother   . Asthma Mother   . Diabetes Neg Hx     SOCIAL HISTORY:   Social History  Substance Use Topics  . Smoking status: Never Smoker  . Smokeless tobacco: Never Used  . Alcohol use No    ALLERGIES:  has No Known Allergies.  MEDICATIONS:  Current Outpatient Prescriptions  Medication Sig Dispense Refill  . acyclovir (ZOVIRAX) 400 MG tablet Take 1 tablet (400 mg total) by mouth 2 (two) times daily. 60 tablet 3  . apixaban  (ELIQUIS) 5 MG TABS tablet Take 1 tablet (5 mg total) by mouth 2 (two) times daily. From 10/27/15 60 tablet 3  . cyclobenzaprine (FLEXERIL) 5 MG tablet Take 1 tablet (5 mg total) by mouth 3 (three) times daily as needed for muscle spasms. (Patient not taking: Reported on 01/23/2016) 30 tablet 0  . lidocaine-prilocaine (EMLA) cream Apply 1 application topically as needed (prior to accessing port).    . ondansetron (ZOFRAN) 8 MG tablet Take 8 mg by mouth every 8 (eight) hours as needed for nausea or vomiting.    Marland Kitchen oxyCODONE-acetaminophen (PERCOCET) 7.5-325 MG tablet   0  . predniSONE (DELTASONE) 50 MG tablet TAKE TWO TABLETS ONCE A DAY FOR FIVE DAYS. START ON DAY OF YOUR CHEMOTHERAPY.TAKE WITH FOOD. 10 tablet 3  . prochlorperazine (COMPAZINE) 10 MG tablet Take 1 tablet (10 mg total) by mouth every 6 (six) hours as needed for nausea or vomiting. 40 tablet 1  . ranitidine (ZANTAC) 150 MG tablet Take 150 mg by mouth 2 (two) times daily.      No current facility-administered medications for this visit.    Facility-Administered Medications Ordered in Other Visits  Medication Dose Route Frequency Provider Last Rate Last Dose  . 0.9 %  sodium chloride infusion   Intravenous Continuous Evlyn Kanner, NP   Stopped at 10/14/15 1312    PHYSICAL EXAMINATION: ECOG PERFORMANCE STATUS: 1 - Symptomatic but completely ambulatory  There were no vitals taken for this visit.  There were no vitals filed for this visit.  GENERAL: Well-nourished well-developed; Alert, no distress and comfortable.   Accompanied by her husband.  EYES: no pallor or icterus OROPHARYNX: no thrush; good dentition  NECK: supple, no masses felt LYMPH:  no palpable lymphadenopathy in the cervical, axillary or inguinal regions LUNGS: clear to auscultation and  No wheeze or crackles HEART/CVS: regular rate & rhythm and no murmurs; No lower extremity edema. ABDOMEN:abdomen soft, non-tender and normal bowel sounds Musculoskeletal:no  cyanosis of digits and no clubbing  PSYCH: alert & oriented x 3 with fluent speech NEURO: no focal motor/sensory deficits SKIN:  no rashes or significant lesions;  LABORATORY DATA:  I have reviewed the data as listed    Component Value Date/Time   NA 139 02/01/2016 1033   NA 139 06/29/2010 0749   K 4.4 02/01/2016 1033   CL 108 02/01/2016 1033   CO2 27 02/01/2016 1033   GLUCOSE 92 02/01/2016 1033   BUN 18 02/01/2016 1033   BUN 16 06/29/2010 0749   CREATININE 0.89 02/01/2016 1033   CALCIUM 9.0 02/01/2016 1033   PROT 6.4 (L) 01/23/2016 0912   ALBUMIN 4.1 01/23/2016 0912   AST 27 01/23/2016 0912   ALT 23 01/23/2016 0912   ALKPHOS 68 01/23/2016 0912   BILITOT 0.6 01/23/2016 0912   GFRNONAA >60 02/01/2016 1033   GFRAA >60 02/01/2016 1033    No results found for: SPEP, UPEP  Lab Results  Component Value Date   WBC 5.2  02/01/2016   NEUTROABS 4.0 02/01/2016   HGB 10.1 (L) 02/01/2016   HCT 28.7 (L) 02/01/2016   MCV 90.8 02/01/2016   PLT 126 (L) 02/01/2016      Chemistry      Component Value Date/Time   NA 139 02/01/2016 1033   NA 139 06/29/2010 0749   K 4.4 02/01/2016 1033   CL 108 02/01/2016 1033   CO2 27 02/01/2016 1033   BUN 18 02/01/2016 1033   BUN 16 06/29/2010 0749   CREATININE 0.89 02/01/2016 1033   GLU 93 06/29/2010 0749      Component Value Date/Time   CALCIUM 9.0 02/01/2016 1033   ALKPHOS 68 01/23/2016 0912   AST 27 01/23/2016 0912   ALT 23 01/23/2016 0912   BILITOT 0.6 01/23/2016 0912       RADIOGRAPHIC STUDIES: I have personally reviewed the radiological images as listed and agreed with the findings in the report. No results found.   ASSESSMENT & PLAN:  No problem-specific Assessment & Plan notes found for this encounter.   No orders of the defined types were placed in this encounter.  All questions were answered. The patient knows to call the clinic with any problems, questions or concerns.      Cammie Sickle, MD 02/03/2016 9:01  AM

## 2016-02-03 NOTE — Progress Notes (Signed)
No concerns today 

## 2016-02-06 ENCOUNTER — Encounter: Payer: Self-pay | Admitting: Internal Medicine

## 2016-02-07 LAB — METHOTREXATE

## 2016-02-20 ENCOUNTER — Encounter: Payer: Self-pay | Admitting: Internal Medicine

## 2016-02-20 ENCOUNTER — Encounter
Admission: RE | Admit: 2016-02-20 | Discharge: 2016-02-20 | Disposition: A | Discharge status: Home / Self Care | Payer: BLUE CROSS/BLUE SHIELD | Source: Ambulatory Visit | Attending: Internal Medicine | Admitting: Internal Medicine

## 2016-02-20 DIAGNOSIS — C8583 Other specified types of non-Hodgkin lymphoma, intra-abdominal lymph nodes: Secondary | ICD-10-CM | POA: Diagnosis not present

## 2016-02-20 LAB — GLUCOSE, CAPILLARY: Glucose-Capillary: 91 mg/dL (ref 65–99)

## 2016-02-20 MED ORDER — FLUDEOXYGLUCOSE F - 18 (FDG) INJECTION
13.0000 | Freq: Once | INTRAVENOUS | Status: AC
  Administered 2016-02-20: 13.19 via INTRAVENOUS

## 2016-02-22 ENCOUNTER — Telehealth: Payer: Self-pay | Admitting: Internal Medicine

## 2016-02-22 LAB — METHOTREXATE

## 2016-02-22 NOTE — Telephone Encounter (Signed)
Spoke to pt re: PET results; plan follow up as planned.   FYI

## 2016-02-27 ENCOUNTER — Encounter: Payer: Self-pay | Admitting: Internal Medicine

## 2016-02-27 ENCOUNTER — Inpatient Hospital Stay: Payer: BLUE CROSS/BLUE SHIELD | Attending: Internal Medicine

## 2016-02-27 ENCOUNTER — Inpatient Hospital Stay
Admission: AD | Admit: 2016-02-27 | Discharge: 2016-03-03 | DRG: 846 | Disposition: A | Discharge status: Home / Self Care | Payer: BLUE CROSS/BLUE SHIELD | Source: Ambulatory Visit | Attending: Internal Medicine | Admitting: Internal Medicine

## 2016-02-27 ENCOUNTER — Inpatient Hospital Stay: Payer: BLUE CROSS/BLUE SHIELD | Admitting: Internal Medicine

## 2016-02-27 ENCOUNTER — Other Ambulatory Visit: Payer: Self-pay | Admitting: Internal Medicine

## 2016-02-27 VITALS — BP 129/81 | HR 66 | Temp 94.9°F | Resp 18 | Ht 62.0 in | Wt 162.2 lb

## 2016-02-27 DIAGNOSIS — C8583 Other specified types of non-Hodgkin lymphoma, intra-abdominal lymph nodes: Secondary | ICD-10-CM

## 2016-02-27 DIAGNOSIS — K219 Gastro-esophageal reflux disease without esophagitis: Secondary | ICD-10-CM

## 2016-02-27 DIAGNOSIS — Z5111 Encounter for antineoplastic chemotherapy: Principal | ICD-10-CM

## 2016-02-27 DIAGNOSIS — C8333 Diffuse large B-cell lymphoma, intra-abdominal lymph nodes: Secondary | ICD-10-CM | POA: Diagnosis present

## 2016-02-27 DIAGNOSIS — Z86718 Personal history of other venous thrombosis and embolism: Secondary | ICD-10-CM

## 2016-02-27 DIAGNOSIS — E876 Hypokalemia: Secondary | ICD-10-CM

## 2016-02-27 DIAGNOSIS — Z7901 Long term (current) use of anticoagulants: Secondary | ICD-10-CM

## 2016-02-27 DIAGNOSIS — D702 Other drug-induced agranulocytosis: Secondary | ICD-10-CM

## 2016-02-27 DIAGNOSIS — D649 Anemia, unspecified: Secondary | ICD-10-CM | POA: Insufficient documentation

## 2016-02-27 DIAGNOSIS — Z9049 Acquired absence of other specified parts of digestive tract: Secondary | ICD-10-CM

## 2016-02-27 DIAGNOSIS — E669 Obesity, unspecified: Secondary | ICD-10-CM | POA: Diagnosis not present

## 2016-02-27 DIAGNOSIS — Z800 Family history of malignant neoplasm of digestive organs: Secondary | ICD-10-CM

## 2016-02-27 DIAGNOSIS — I700 Atherosclerosis of aorta: Secondary | ICD-10-CM | POA: Diagnosis present

## 2016-02-27 DIAGNOSIS — K2100 Gastro-esophageal reflux disease with esophagitis, without bleeding: Secondary | ICD-10-CM

## 2016-02-27 DIAGNOSIS — C8330 Diffuse large B-cell lymphoma, unspecified site: Secondary | ICD-10-CM

## 2016-02-27 DIAGNOSIS — M1990 Unspecified osteoarthritis, unspecified site: Secondary | ICD-10-CM | POA: Insufficient documentation

## 2016-02-27 DIAGNOSIS — M810 Age-related osteoporosis without current pathological fracture: Secondary | ICD-10-CM | POA: Diagnosis present

## 2016-02-27 DIAGNOSIS — Z7952 Long term (current) use of systemic steroids: Secondary | ICD-10-CM

## 2016-02-27 DIAGNOSIS — R944 Abnormal results of kidney function studies: Secondary | ICD-10-CM | POA: Diagnosis not present

## 2016-02-27 DIAGNOSIS — D61810 Antineoplastic chemotherapy induced pancytopenia: Secondary | ICD-10-CM | POA: Diagnosis present

## 2016-02-27 DIAGNOSIS — Z79899 Other long term (current) drug therapy: Secondary | ICD-10-CM | POA: Insufficient documentation

## 2016-02-27 DIAGNOSIS — D61818 Other pancytopenia: Secondary | ICD-10-CM | POA: Diagnosis not present

## 2016-02-27 LAB — URINE PH
PH: 6 (ref 5.0–8.0)
PH: 7 (ref 5.0–8.0)
PH: 8 (ref 5.0–8.0)
PH: 7 (ref 5.0–8.0)
PH: 6 (ref 5.0–8.0)
pH: 5 (ref 5.0–8.0)
pH: 6 (ref 5.0–8.0)
pH: 7 (ref 5.0–8.0)
pH: 6 (ref 5.0–8.0)
pH: 6 (ref 5.0–8.0)

## 2016-02-27 LAB — COMPREHENSIVE METABOLIC PANEL
ALK PHOS: 54 U/L (ref 38–126)
ALT: 19 U/L (ref 14–54)
ANION GAP: 8 (ref 5–15)
AST: 22 U/L (ref 15–41)
Albumin: 4.1 g/dL (ref 3.5–5.0)
BILIRUBIN TOTAL: 0.8 mg/dL (ref 0.3–1.2)
BUN: 11 mg/dL (ref 6–20)
CALCIUM: 9.3 mg/dL (ref 8.9–10.3)
CO2: 25 mmol/L (ref 22–32)
Chloride: 108 mmol/L (ref 101–111)
Creatinine, Ser: 0.83 mg/dL (ref 0.44–1.00)
GFR calc non Af Amer: >60 mL/min (ref >60)
Glucose, Bld: 96 mg/dL (ref 65–99)
Potassium: 3.9 mmol/L (ref 3.5–5.1)
SODIUM: 141 mmol/L (ref 135–145)
TOTAL PROTEIN: 6.3 g/dL — AB (ref 6.5–8.1)

## 2016-02-27 LAB — CBC WITH DIFFERENTIAL/PLATELET
Basophils Absolute: 0 10*3/uL (ref 0–0.1)
Basophils Relative: 1 %
EOS ABS: 0 10*3/uL (ref 0–0.7)
Eosinophils Relative: 1 %
HCT: 32.4 % — ABNORMAL LOW (ref 35.0–47.0)
HEMOGLOBIN: 11.6 g/dL — AB (ref 12.0–16.0)
LYMPHS ABS: 0.6 10*3/uL — AB (ref 1.0–3.6)
Lymphocytes Relative: 14 %
MCH: 31.9 pg (ref 26.0–34.0)
MCHC: 35.8 g/dL (ref 32.0–36.0)
MCV: 89 fL (ref 80.0–100.0)
MONO ABS: 0.6 10*3/uL (ref 0.2–0.9)
MONOS PCT: 17 %
NEUTROS PCT: 67 %
Neutro Abs: 2.6 10*3/uL (ref 1.4–6.5)
Platelets: 186 10*3/uL (ref 150–440)
RBC: 3.64 MIL/uL — ABNORMAL LOW (ref 3.80–5.20)
RDW: 14.3 % (ref 11.5–14.5)
WBC: 3.9 10*3/uL (ref 3.6–11.0)

## 2016-02-27 MED ORDER — SODIUM CHLORIDE 0.9 % IV SOLN
8.0000 mg | Freq: Three times a day (TID) | INTRAVENOUS | Status: DC | PRN
  Filled 2016-02-27: qty 4

## 2016-02-27 MED ORDER — SENNOSIDES-DOCUSATE SODIUM 8.6-50 MG PO TABS
1.0000 | ORAL_TABLET | Freq: Every evening | ORAL | Status: DC | PRN
  Administered 2016-02-28 – 2016-03-01 (×3): 1 via ORAL
  Filled 2016-02-27 (×3): qty 1

## 2016-02-27 MED ORDER — ONDANSETRON HCL 4 MG/2ML IJ SOLN: 4.0000 mg | Freq: Three times a day (TID) | INTRAMUSCULAR | Status: DC | PRN

## 2016-02-27 MED ORDER — HYDROCORTISONE 2.5 % RE CREA
1.0000 "application " | TOPICAL_CREAM | Freq: Two times a day (BID) | RECTAL | Status: DC | PRN
  Filled 2016-02-27: qty 28.35

## 2016-02-27 MED ORDER — SODIUM CHLORIDE 0.9% FLUSH
10.0000 mL | Freq: Once | INTRAVENOUS | Status: AC
Start: 2016-02-27 — End: 2016-02-27
  Administered 2016-02-27: 10 mL via INTRAVENOUS
  Filled 2016-02-27: qty 10

## 2016-02-27 MED ORDER — ACYCLOVIR 400 MG PO TABS
400.0000 mg | ORAL_TABLET | Freq: Two times a day (BID) | ORAL | Status: DC
  Administered 2016-02-27: 400 mg via ORAL
  Filled 2016-02-27 (×2): qty 1

## 2016-02-27 MED ORDER — SODIUM CHLORIDE 0.9 % IV SOLN
Freq: Once | INTRAVENOUS | Status: AC
  Administered 2016-02-27: 18:00:00 via INTRAVENOUS
  Filled 2016-02-27: qty 126

## 2016-02-27 MED ORDER — SODIUM CHLORIDE 0.9 % IV SOLN: 6.3000 g | Freq: Once | INTRAVENOUS | Status: DC

## 2016-02-27 MED ORDER — PANTOPRAZOLE SODIUM 40 MG PO TBEC
40.0000 mg | DELAYED_RELEASE_TABLET | Freq: Two times a day (BID) | ORAL | Status: DC
  Administered 2016-02-28 – 2016-02-29 (×3): 40 mg via ORAL
  Filled 2016-02-27 (×3): qty 1

## 2016-02-27 MED ORDER — SODIUM CHLORIDE 0.9 % IV SOLN
6.3000 g | Freq: Once | INTRAVENOUS | Status: DC
  Filled 2016-02-27: qty 252

## 2016-02-27 MED ORDER — APIXABAN 5 MG PO TABS
5.0000 mg | ORAL_TABLET | Freq: Two times a day (BID) | ORAL | Status: DC
  Administered 2016-02-27 – 2016-03-03 (×10): 5 mg via ORAL
  Filled 2016-02-27 (×10): qty 1

## 2016-02-27 MED ORDER — GUAIFENESIN-DM 100-10 MG/5ML PO SYRP: 10.0000 mL | ORAL_SOLUTION | ORAL | Status: DC | PRN

## 2016-02-27 MED ORDER — SODIUM CHLORIDE 0.9 % IV SOLN
Freq: Once | INTRAVENOUS | Status: AC
  Filled 2016-02-27 (×3): qty 126

## 2016-02-27 MED ORDER — ALUM & MAG HYDROXIDE-SIMETH 200-200-20 MG/5ML PO SUSP: 60.0000 mL | ORAL | Status: DC | PRN

## 2016-02-27 MED ORDER — SODIUM CHLORIDE 0.9 % IV SOLN
Freq: Once | INTRAVENOUS | Status: AC
  Administered 2016-02-27: 18:00:00 16 mg via INTRAVENOUS
  Filled 2016-02-27: qty 8

## 2016-02-27 MED ORDER — STERILE WATER FOR INJECTION IV SOLN
INTRAVENOUS | Status: DC
  Administered 2016-02-27 – 2016-03-03 (×17): via INTRAVENOUS
  Filled 2016-02-27 (×22): qty 9.71

## 2016-02-27 MED ORDER — ONDANSETRON HCL 4 MG PO TABS: 4.0000 mg | ORAL_TABLET | Freq: Three times a day (TID) | ORAL | Status: DC | PRN

## 2016-02-27 MED ORDER — SODIUM CHLORIDE 0.9% FLUSH
10.0000 mL | Freq: Once | INTRAVENOUS | Status: AC
  Administered 2016-02-27: 10 mL via INTRAVENOUS
  Filled 2016-02-27: qty 10

## 2016-02-27 MED ORDER — ACETAMINOPHEN 325 MG PO TABS
650.0000 mg | ORAL_TABLET | ORAL | Status: DC | PRN
  Administered 2016-02-29 – 2016-03-03 (×4): 650 mg via ORAL
  Filled 2016-02-27 (×4): qty 2

## 2016-02-27 MED ORDER — ONDANSETRON 8 MG PO TBDP: 4.0000 mg | ORAL_TABLET | Freq: Three times a day (TID) | ORAL | Status: DC | PRN

## 2016-02-27 NOTE — H&P (Signed)
[name] NOTE  Patient Care Team: Jackolyn Confer, MD as PCP - General (Internal Medicine) Cammie Sickle, MD as Consulting Physician (Internal Medicine) Seeplaputhur Robinette Haines, MD (General Surgery) [name], MD as Consulting Physician (Cardiology)  CHIEF COMPLAINTS/PURPOSE OF CONSULTATION:  Diffuse l large B-cell lymphoma/high risk- intracranial prophylaxis  HISTORY OF PRESENTING ILLNESS:  Stacy Murillo 60 y.o.  female with history of stage IV diffuse large B-cell lymphoma/high risk- is currently status post 6 cycles of R CHOP chemotherapy; is here to review the results of her restaging PET scan;nd also to proceed with cycle #3 of methotrexate high-dose in the hospital.  Patient denies any unusual aches and pains. Appetite is good. No weight loss. No nausea no vomiting. No headaches. No fevers or chills. Denies any tingling or numbness.  ROS: A complete 10 point review of system is done which is negative except mentioned above in history of present illness  MEDICAL HISTORY:  Past Medical History:  Diagnosis Date  . Arthritis   . Blood dyscrasia   . Cancer ([place])   . Chest pain, unspecified   . Diverticulosis 07/06/15  . Dysrhythmia   . Esophagitis 07/06/15  . Fatigue   . Gastritis 07/06/15  . GERD (gastroesophageal reflux disease)   . 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 Leg cramps   . Lymphoma ([place]) 09/14/15   Monoclonal B cell lymphoma  . Night sweats   . Osteoporosis   . Overweight(278.02)    Obesity  . Palpitations   . Shortness of breath dyspnea   . Tachycardia   . Thrombocytopenia ([place])     SURGICAL HISTORY: Past Surgical History:  Procedure Laterality Date  . ABDOMINAL HYSTERECTOMY  2005  . BONE MARROW BIOPSY  09/14/15  . CHOLECYSTECTOMY  1997  . COLONOSCOPY  07/05/2014  . ESOPHAGOGASTRODUODENOSCOPY  07/05/2014  . INGUINAL LYMPH NODE BIOPSY Right 10/05/2015   Procedure: INGUINAL LYMPH NODE BIOPSY;  Surgeon: Christene Lye, MD;   Location: [facility] ORS;  Service: General;  Laterality: Right;  . PERIPHERAL VASCULAR CATHETERIZATION N/A 09/27/2015   Procedure: Glori Luis Cath Insertion;  Surgeon: Algernon Huxley, MD;  Location: [address] CV LAB;  Service: Cardiovascular;  Laterality: N/A;  . PORTACATH PLACEMENT Right     SOCIAL HISTORY: Social History   Social History  . Marital status: Married    Spouse name: N/A  . Number of children: N/A  . Years of education: N/A   Occupational History  . Not on file.   Social History Main Topics  . Smoking status: Never Smoker  . Smokeless tobacco: Never Used  . Alcohol use No  . Drug use: No  . Sexual activity: Yes    Birth control/ protection: None   Other Topics Concern  . Not on file   Social History Narrative   Married   Does not get regular exercise    FAMILY HISTORY: Family History  Problem Relation Age of Onset  . Heart disease Father     CABG x 4  . Hypertension Mother   . Pancreatic cancer Mother   . Ulcers Mother   . Asthma Mother   . Diabetes Neg Hx     ALLERGIES:  has No Known Allergies.  MEDICATIONS:  Current Facility-Administered Medications  Medication Dose Route Frequency Provider Last Rate Last Dose  . acetaminophen (TYLENOL) tablet 650 mg  650 mg Oral Q4H PRN Cammie Sickle, MD      . alum & mag hydroxide-simeth (MAALOX/MYLANTA) 200-200-20  MG/5ML suspension 60 mL  60 mL Oral Q4H PRN Cammie Sickle, MD      . guaiFENesin-dextromethorphan (ROBITUSSIN DM) 100-10 MG/5ML syrup 10 mL  10 mL Oral Q4H PRN Cammie Sickle, MD      . hydrocortisone (ANUSOL-HC) 2.5 % rectal cream 1 application  1 application Rectal BID PRN Cammie Sickle, MD      . ondansetron (ZOFRAN) 16 mg, dexamethasone (DECADRON) 20 mg in sodium chloride 0.9 % 50 mL IVPB   Intravenous Once Cammie Sickle, MD      . ondansetron (ZOFRAN) tablet 4-8 mg  4-8 mg Oral Q8H PRN Cammie Sickle, MD       Or  . ondansetron (ZOFRAN-ODT) disintegrating  tablet 4-8 mg  4-8 mg Oral Q8H PRN Cammie Sickle, MD       Or  . ondansetron (ZOFRAN) injection 4 mg  4 mg Intravenous Q8H PRN Cammie Sickle, MD       Or  . ondansetron (ZOFRAN) 8 mg in sodium chloride 0.9 % 50 mL IVPB  8 mg Intravenous Q8H PRN Cammie Sickle, MD      . senna-docusate (Senokot-S) tablet 1 tablet  1 tablet Oral QHS PRN Cammie Sickle, MD      . sodium chloride 0.225 % with sodium bicarbonate 100 mEq infusion   Intravenous Continuous Cammie Sickle, MD 125 mL/hr at 02/27/16 1255     Facility-Administered Medications Ordered in Other Encounters  Medication Dose Route Frequency Provider Last Rate Last Dose  . 0.9 %  sodium chloride infusion   Intravenous Continuous Evlyn Kanner, NP   Stopped at 10/14/15 1312  . methotrexate (50 mg/ml) 6.3 g in sodium chloride 0.9 % 1,000 mL injection   Intravenous Once Cammie Sickle, MD          .  PHYSICAL EXAMINATION:  Vitals:   02/27/16 1158 02/27/16 1400  BP: 111/77 120/61  Pulse: 70 66  Resp: 16 18  Temp: 97.6 F (36.4 C) 97.4 F (36.3 C)   Filed Weights   02/27/16 1158  Weight: 154 lb 9.6 oz (70.1 kg)    GENERAL: Well-nourished well-developed; Alert, no distress and comfortable.   Accompanied by husband. EYES: no pallor or icterus OROPHARYNX: no thrush or ulceration. NECK: supple, no masses felt LYMPH:  no palpable lymphadenopathy in the cervical, axillary or inguinal regions LUNGS: decreased breath sounds to auscultation at bases and  No wheeze or crackles HEART/CVS: regular rate & rhythm and no murmurs; No lower extremity edema ABDOMEN: abdomen soft, non-tender and normal bowel sounds Musculoskeletal:no cyanosis of digits and no clubbing  PSYCH: alert & oriented x 3 with fluent speech NEURO: no focal motor/sensory deficits SKIN:  no rashes or significant lesions  LABORATORY DATA:  I have reviewed the data as listed Lab Results  Component Value Date   WBC 3.9 02/27/2016    HGB 11.6 (L) 02/27/2016   HCT 32.4 (L) 02/27/2016   MCV 89.0 02/27/2016   PLT 186 02/27/2016    Recent Labs  10/20/15 0237  11/07/15 1440  01/23/16 0912  01/28/16 0543 02/01/16 1033 02/03/16 0958 02/27/16 0935  NA  --   < > 140  < > 138  < > 142 139  --  141  K  --   < > 3.8  < > 3.9  < > 3.4* 4.4  --  3.9  CL  --   < > 104  < > 108  < >  105 108  --  108  CO2  --   < > 30  < > 25  < > 34* 27  --  25  GLUCOSE  --   < > 108*  < > 99  < > 94 92  --  96  BUN  --   < > 11  < > 20  < > 10 18  --  11  CREATININE  --   < > 0.68  < > 0.75  < > 0.91 0.89  --  0.83  CALCIUM  --   < > 8.9  < > 9.0  < > 8.4* 9.0  --  9.3  GFRNONAA  --   < > >60  < > >60  < > >60 >60  --  >60  GFRAA  --   < > >60  < > >60  < > >60 >60  --  >60  PROT 5.5*  < > 7.6  < > 6.4*  --   --   --  6.4* 6.3*  ALBUMIN 3.2*  < > 3.9  < > 4.1  --   --   --  4.1 4.1  AST 61*  < > 20  < > 27  --   --   --  26 22  ALT 183*  < > 19  < > 23  --   --   --  39 19  ALKPHOS 199*  < > 109  < > 68  --   --   --  64 54  BILITOT 0.3  < > 0.4  < > 0.6  --   --   --  0.9 0.8  BILIDIR <0.1*  --  <0.1*  --   --   --   --   --  <0.1*  --   IBILI NOT CALCULATED  --  NOT CALCULATED  --   --   --   --   --  NOT CALCULATED  --   < > = values in this interval not displayed.  RADIOGRAPHIC STUDIES: I have personally reviewed the radiological images as listed and agreed with the findings in the report. Nm Pet Image Restag (ps) Skull Base To Thigh  Result Date: 02/20/2016 CLINICAL DATA:  Subsequent treatment strategy for large cell lymphoma of intra abdominal lymph nodes. Status post right inguinal node biopsy 10/05/2015. Chemotherapy 3 weeks ago. EXAM: NUCLEAR MEDICINE PET SKULL BASE TO THIGH TECHNIQUE: 13.2 mCi F-18 FDG was injected intravenously. Full-ring PET imaging was performed from the skull base to thigh after the radiotracer. CT data was obtained and used for attenuation correction and anatomic localization. FASTING BLOOD GLUCOSE:   Value: 91 mg/dl COMPARISON:  11/11/2015 FINDINGS: NECK No areas of abnormal hypermetabolism. CHEST No thoracic nodal or pulmonary parenchymal hypermetabolism. ABDOMEN/PELVIS No abdominal pelvic nodal hypermetabolism. No splenic hypermetabolism. SKELETON Redemonstration of diffuse low-level marrow hypermetabolism. CT IMAGES PERFORMED FOR ATTENUATION CORRECTION No cervical adenopathy. A right-sided Port-A-Cath which terminates at the low SVC. Heart size upper normal. Tiny hiatal hernia. Cholecystectomy. Abdominal aortic atherosclerosis. No adenopathy within the abdomen or pelvis. Hysterectomy. IMPRESSION: 1. No findings to suggest residual or recurrent lymphoma. 2. Low-level marrow hypermetabolism, similar. This is likely related to stimulation by chemotherapy. 3. Aortic atherosclerosis. Electronically Signed   By: Abigail Miyamoto M.D.   On: 02/20/2016 13:47    ASSESSMENT & PLAN:   # 60 year old female patient with a history of diffuse large B-cell lymphoma/involving peripheral blood status  post 6 cycles of R CHOP currently admitted to the hospital for high-dose methotrexate cycle #3.   # Intracranial prophylaxis- with high-dose methotrexate labs within normal limits. Proceed with cycle #3 and monitor methotrexate levels closely after chemotherapy. And adjust leucovorin based on Mxt levels.  # Diffuse large B-cell lymphoma status post 6 cycles of R CHOP- PET scan shows complete response. Mild bone marrow uptake likely reactive. Reviewed the images myself and with the patient and has been in detail.   # I reviewed the blood work- with the patient in detail; also reviewed the imaging independently [as summarized above]; and with the patient in detail.   # I anticipate the patient to stay at least 5 days.    Cammie Sickle, MD 02/27/2016 5:12 PM

## 2016-02-27 NOTE — Assessment & Plan Note (Signed)
#   Status post 6 cycles of R CHOP- complete response on PET scan.  # Admitted to the hospital for high-dose methotrexate #3 today.

## 2016-02-27 NOTE — Progress Notes (Signed)
PET scan last Monday.  Recent UTI had antibiotic was urinating blood

## 2016-02-27 NOTE — Progress Notes (Addendum)
MEDICATION RELATED CONSULT NOTE - INITIAL   Pharmacy Consult for  High- Dose Methotrexate monitoring Indication: Monitoring of methotrexate levels and ordering leucovorin doses.   No Known Allergies  Patient Measurements: Height: 5\' 2"  (157.5 cm) Weight: 154 lb 9.6 oz (70.1 kg) IBW/kg (Calculated) : 50.1 Adjusted Body Weight:    Vital Signs: Temp: 97.5 F (36.4 C) (10/16 2117) Temp Source: Oral (10/16 2117) BP: 134/65 (10/16 2117) Pulse Rate: 65 (10/16 2117) Intake/Output from previous day: No intake/output data recorded. Intake/Output from this shift: Total I/O In: -  Out: 800 [Urine:800]  Labs:  Recent Labs  02/27/16 0935  WBC 3.9  HGB 11.6*  HCT 32.4*  PLT 186  CREATININE 0.83  ALBUMIN 4.1  PROT 6.3*  AST 22  ALT 19  ALKPHOS 54  BILITOT 0.8   Estimated Creatinine Clearance: 65.3 mL/min (by C-G formula based on SCr of 0.83 mg/dL).   Microbiology: No results found for this or any previous visit (from the past 720 hour(s)).  Medical History: Past Medical History:  Diagnosis Date  . Arthritis   . Blood dyscrasia   . Cancer ([place])   . Chest pain, unspecified   . Diverticulosis 07/06/15  . Dysrhythmia   . Esophagitis 07/06/15  . Fatigue   . Gastritis 07/06/15  . GERD (gastroesophageal reflux disease)   . 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 Leg cramps   . Lymphoma ([place]) 09/14/15   Monoclonal B cell lymphoma  . Night sweats   . Osteoporosis   . Overweight(278.02)    Obesity  . Palpitations   . Shortness of breath dyspnea   . Tachycardia   . Thrombocytopenia ([facility])     Medications:  Scheduled:  . acyclovir  400 mg Oral BID  . apixaban  5 mg Oral BID  . [START ON 02/28/2016] pantoprazole  40 mg Oral BID AC   Infusions:  .  sodium bicarbonate infusion 1/4 NS 1000 mL 150 mL/hr at 02/27/16 2241    Assessment: 60 yo female to start on high dose methotrexate infusion with Leucovorin rescue, hx large B-cell Lymphoma.  Plan:  Per Dr. Rogue Bussing, do  not start the methotrexate infusion until urine pH is at 7.0 and maintain> 7.0 during therapy; NaBicarb drip to continue at least 48h after MTX infusion. Per MD, check urine pH at least daily.   Sodium Bicarb Drip:  Goal Urine pH > 7.0 If urine pH less than goal consider increasing NaBicarb drip by 25 ml/hr, or if rate exceeds 175 ml/hr on the current NaBicarb drip, may need to increase bicarb in bag to 150 mEq. Checking urine pH every hour until pH >7.0 then once stabilizes consider checking every 2 hrs then every 4hrs etc . Care order instruction for RN to enter urine pH orders. Continue at least 48 hrs after Methotrexate infusion finished.  10/16 22:00 repeat urine pH 6.0. Increased drip rate to 150 ml/hr.  Methotrexate Levels:  Pt needs MTX level ordered at 24h after start of infusion (i.e. 20 h after the end of infusion b/c infusion runs over 4 hrs).  Based on MTX level, leucovorin dose will need to be ordered and adjusted based on the daily MTX level.  Pt will need daily MTX levels and urine pH monitoring until MTX levels are <0.1 microM. (Note: Labs are sent to [facility] so takes 4-6 hrs to get result). Results are faxed? Can also find by going under "Chart Review" then "Media" and look for "Lab result scan".  10/17 MTX level  ordered for 18:00 today, ~24 hours after start of infusion.  Leucovorin: Follow Infomation for High-dose methotrexate-rescue- Leucovorin dosing (see [email or web address]).  10/17 will initiate Leucovorin rescue with 10mg /m2 IV q6h. Will adjust dose based on Methotrexate levels.  Paulina Fusi, PharmD, BCPS 02/27/2016 10:43 PM

## 2016-02-27 NOTE — Progress Notes (Signed)
Per Dr. Mike Gip may order patients home medications. Oncoming RN made aware. Madlyn Frankel, RN

## 2016-02-27 NOTE — Progress Notes (Signed)
Methotrexate order had expired. Reordered to retime on MAR for 1800.   Lenis Noon, PharmD 02/27/16 5:38 PM

## 2016-02-27 NOTE — Progress Notes (Signed)
Admission to 1C for high dose methatrexate. Report called by Renita Papa, RN from cancer ctr to 1C.

## 2016-02-27 NOTE — Progress Notes (Signed)
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 CT- 1-2 CM LN subpectoral/medistinal/ retro-peritoneal/pelvic/ Right inguinal LN 1.6cm- Bx- DLBCL- ABC; myc-POS; FISH gene re-arragement-NEG. PET- MULTIPLE LN/ Bone involvement; May 25th- R-CHOP q3 W x2; PET- Excellent Response.   # Lumbar puncture- difficult-spinal headache.   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   Patient is feeling well- No chest pain or shortness of breath or cough.. Denies any nausea or vomiting. Denies any headaches No bleeding. No nausea no vomiting.  Denies any significant headaches. Appetite is good. Denies any tingling and numbness. No lumps or bumps.  REVIEW OF SYSTEMS:  A complete 10  point review of system is done which is negative except mentioned above/history of present illness.   PAST MEDICAL HISTORY :  Past Medical History:  Diagnosis Date  . Arthritis   . Blood dyscrasia   . Cancer ([place])   . Chest pain, unspecified   . Diverticulosis 07/06/15  . Dysrhythmia   . Esophagitis 07/06/15  . Fatigue   . Gastritis 07/06/15  . GERD (gastroesophageal reflux disease)   . 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 Leg cramps   . Lymphoma ([place]) 09/14/15   Monoclonal B cell lymphoma  . Night sweats   . Osteoporosis   . Overweight(278.02)    Obesity  . Palpitations   . Shortness of breath dyspnea   . Tachycardia   . Thrombocytopenia ([place])     PAST SURGICAL HISTORY :   Past Surgical History:  Procedure Laterality Date  . ABDOMINAL HYSTERECTOMY  2005  . BONE MARROW BIOPSY  09/14/15  . CHOLECYSTECTOMY  1997  . COLONOSCOPY  07/05/2014  . ESOPHAGOGASTRODUODENOSCOPY  07/05/2014  . INGUINAL LYMPH NODE BIOPSY Right 10/05/2015   Procedure: INGUINAL LYMPH NODE BIOPSY;  Surgeon: Christene Lye, MD;  Location: [facility] ORS;  Service: General;  Laterality: Right;  . PERIPHERAL VASCULAR CATHETERIZATION N/A 09/27/2015   Procedure: Glori Luis Cath Insertion;  Surgeon: Algernon Huxley, MD;  Location: [place] [facility];  Service: Cardiovascular;  Laterality: N/A;  . PORTACATH PLACEMENT Right     FAMILY HISTORY :   Family History  Problem Relation Age of Onset  . Heart disease Father     CABG x 4  . Hypertension Mother   . Pancreatic  cancer Mother   . Ulcers Mother   . Asthma Mother   . Diabetes Neg Hx     SOCIAL HISTORY:   Social History  Substance Use Topics  . Smoking status: Never Smoker  . Smokeless tobacco: Never Used  . Alcohol use No    ALLERGIES:  has No Known Allergies.  MEDICATIONS:  No current facility-administered medications for this visit.    No current outpatient prescriptions on file.   Facility-Administered Medications Ordered in Other Visits  Medication  Dose Route Frequency Provider Last Rate Last Dose  . 0.9 %  sodium chloride infusion   Intravenous Continuous Evlyn Kanner, NP   Stopped at 10/14/15 1312  . acetaminophen (TYLENOL) tablet 650 mg  650 mg Oral Q4H PRN Cammie Sickle, MD      . alum & mag hydroxide-simeth (MAALOX/MYLANTA) 200-200-20 MG/5ML suspension 60 mL  60 mL Oral Q4H PRN Cammie Sickle, MD      . guaiFENesin-dextromethorphan (ROBITUSSIN DM) 100-10 MG/5ML syrup 10 mL  10 mL Oral Q4H PRN Cammie Sickle, MD      . hydrocortisone (ANUSOL-HC) 2.5 % rectal cream 1 application  1 application Rectal BID PRN Cammie Sickle, MD      . methotrexate (50 mg/ml) 6.3 g in sodium chloride 0.9 % 1,000 mL injection   Intravenous Once Cammie Sickle, MD      . ondansetron (ZOFRAN) 16 mg, dexamethasone (DECADRON) 20 mg in sodium chloride 0.9 % 50 mL IVPB   Intravenous Once Cammie Sickle, MD      . ondansetron (ZOFRAN) tablet 4-8 mg  4-8 mg Oral Q8H PRN Cammie Sickle, MD       Or  . ondansetron (ZOFRAN-ODT) disintegrating tablet 4-8 mg  4-8 mg Oral Q8H PRN Cammie Sickle, MD       Or  . ondansetron (ZOFRAN) injection 4 mg  4 mg Intravenous Q8H PRN Cammie Sickle, MD       Or  . ondansetron (ZOFRAN) 8 mg in sodium chloride 0.9 % 50 mL IVPB  8 mg Intravenous Q8H PRN Cammie Sickle, MD      . senna-docusate (Senokot-S) tablet 1 tablet  1 tablet Oral QHS PRN Cammie Sickle, MD      . sodium chloride 0.225 % with sodium bicarbonate 100 mEq infusion   Intravenous Continuous Cammie Sickle, MD 125 mL/hr at 02/27/16 1255      PHYSICAL EXAMINATION: ECOG PERFORMANCE STATUS: 1 - Symptomatic but completely ambulatory  BP 129/81 (BP Location: Right Arm, Patient Position: Sitting)   Pulse 66   Temp (!) 94.9 F (34.9 C) (Tympanic)   Resp 18   Ht 5' 2" (1.575 m)   Wt 162 lb 3.2 oz (73.6 kg)   BMI 29.67 kg/m   Filed Weights   02/27/16 0958  Weight: 162 lb 3.2 oz (73.6 kg)     GENERAL: Well-nourished well-developed; Alert, no distress and comfortable.   Accompanied by her husband.  EYES: no pallor or icterus OROPHARYNX: no thrush; good dentition  NECK: supple, no masses felt LYMPH:  no palpable lymphadenopathy in the cervical, axillary or inguinal regions LUNGS: clear to auscultation and  No wheeze or crackles HEART/CVS: regular rate & rhythm and no murmurs; No lower extremity edema. ABDOMEN:abdomen soft, non-tender and normal bowel sounds Musculoskeletal:no cyanosis of digits and no clubbing  PSYCH: alert & oriented x 3 with fluent speech NEURO: no focal motor/sensory deficits SKIN:  no rashes or  significant lesions;  LABORATORY DATA:  I have reviewed the data as listed    Component Value Date/Time   NA 141 02/27/2016 0935   NA 139 06/29/2010 0749   K 3.9 02/27/2016 0935   CL 108 02/27/2016 0935   CO2 25 02/27/2016 0935   GLUCOSE 96 02/27/2016 0935   BUN 11 02/27/2016 0935   BUN 16 06/29/2010 0749   CREATININE 0.83 02/27/2016 0935   CALCIUM 9.3 02/27/2016 0935   PROT 6.3 (L) 02/27/2016 0935   ALBUMIN 4.1 02/27/2016 0935   AST 22 02/27/2016 0935   ALT 19 02/27/2016 0935   ALKPHOS 54 02/27/2016 0935   BILITOT 0.8 02/27/2016 0935   GFRNONAA >60 02/27/2016 0935   GFRAA >60 02/27/2016 0935    No results found for: SPEP, UPEP  Lab Results  Component Value Date   WBC 3.9 02/27/2016   NEUTROABS 2.6 02/27/2016   HGB 11.6 (L) 02/27/2016   HCT 32.4 (L) 02/27/2016   MCV 89.0 02/27/2016   PLT 186 02/27/2016      Chemistry      Component Value Date/Time   NA 141 02/27/2016 0935   NA 139 06/29/2010 0749   K 3.9 02/27/2016 0935   CL 108 02/27/2016 0935   CO2 25 02/27/2016 0935   BUN 11 02/27/2016 0935   BUN 16 06/29/2010 0749   CREATININE 0.83 02/27/2016 0935   GLU 93 06/29/2010 0749      Component Value Date/Time   CALCIUM 9.3 02/27/2016 0935   ALKPHOS 54 02/27/2016 0935   AST 22 02/27/2016 0935   ALT 19 02/27/2016 0935   BILITOT  0.8 02/27/2016 0935       RADIOGRAPHIC STUDIES: I have personally reviewed the radiological images as listed and agreed with the findings in the report. No results found.   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  # Admitted to the hospital for high-dose methotrexate #3 today.    No orders of the defined types were placed in this encounter.  All questions were answered. The patient knows to call the clinic with any problems, questions or concerns.      Cammie Sickle, MD 02/27/2016 5:19 PM

## 2016-02-27 NOTE — Progress Notes (Signed)
MEDICATION RELATED CONSULT NOTE - INITIAL   Pharmacy Consult for  High- Dose Methotrexate monitoring Indication: Monitoring of methotrexate levels and ordering leucovorin doses.   No Known Allergies  Patient Measurements: Height: 5\' 2"  (157.5 cm) Weight: 154 lb 9.6 oz (70.1 kg) IBW/kg (Calculated) : 50.1 Adjusted Body Weight:    Vital Signs: Temp: 97.4 F (36.3 C) (10/16 1400) Temp Source: Oral (10/16 1400) BP: 120/61 (10/16 1400) Pulse Rate: 66 (10/16 1400) Intake/Output from previous day: No intake/output data recorded. Intake/Output from this shift: Total I/O In: 0  Out: 500 [Urine:500]  Labs:  Recent Labs  02/27/16 0935  WBC 3.9  HGB 11.6*  HCT 32.4*  PLT 186  CREATININE 0.83  ALBUMIN 4.1  PROT 6.3*  AST 22  ALT 19  ALKPHOS 54  BILITOT 0.8   Estimated Creatinine Clearance: 65.3 mL/min (by C-G formula based on SCr of 0.83 mg/dL).   Microbiology: No results found for this or any previous visit (from the past 720 hour(s)).  Medical History: Past Medical History:  Diagnosis Date  . Arthritis   . Blood dyscrasia   . Cancer ([place])   . Chest pain, unspecified   . Diverticulosis 07/06/15  . Dysrhythmia   . Esophagitis 07/06/15  . Fatigue   . Gastritis 07/06/15  . GERD (gastroesophageal reflux disease)   . 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 Leg cramps   . Lymphoma ([place]) 09/14/15   Monoclonal B cell lymphoma  . Night sweats   . Osteoporosis   . Overweight(278.02)    Obesity  . Palpitations   . Shortness of breath dyspnea   . Tachycardia   . Thrombocytopenia (HCC)     Medications:  Scheduled:  . ondansetron (ZOFRAN) with dexamethasone (DECADRON) IV   Intravenous Once   Infusions:  .  sodium bicarbonate infusion 1/4 NS 1000 mL 125 mL/hr at 02/27/16 1255    Assessment: 60 yo female to start on high dose methotrexate infusion with Leucovorin rescue, hx large B-cell Lymphoma.  Plan:  Per Dr. Rogue Bussing, do not start the methotrexate infusion  until urine pH is at 7.0 and maintain> 7.0 during therapy; NaBicarb drip to continue at least 48h after MTX infusion. Per MD, check urine pH at least daily.   Sodium Bicarb Drip:  Goal Urine pH > 7.0 If urine pH less than goal consider increasing NaBicarb drip by 25 ml/hr, or if rate exceeds 175 ml/hr on the current NaBicarb drip, may need to increase bicarb in bag to 150 mEq. Checking urine pH every hour until pH >7.0 then once stabilizes consider checking every 2 hrs then every 4hrs etc . Care order instruction for RN to enter urine pH orders. Continue at least 48 hrs after Methotrexate infusion finished.  Methotrexate Levels:  Pt needs MTX level ordered at 24h after start of infusion (i.e. 20 h after the end of infusion b/c infusion runs over 4 hrs).  Based on MTX level, leucovorin dose will need to be ordered and adjusted based on the daily MTX level.  Pt will need daily MTX levels and urine pH monitoring until MTX levels are <0.1 microM. (Note: Labs are sent to [facility] so takes 4-6 hrs to get result). Results are faxed? Can also find by going under "Chart Review" then "Media" and look for "Lab result scan".  Leucovorin: Follow Infomation for High-dose methotrexate-rescue- Leucovorin dosing (see [email or web address]).  10/17 will initiate Leucovorin rescue with 10mg /m2 IV q6h. Will adjust dose based on Methotrexate levels.  Paulina Fusi, PharmD, BCPS  02/27/2016 3:44 PM

## 2016-02-28 ENCOUNTER — Other Ambulatory Visit: Payer: Self-pay | Admitting: *Deleted

## 2016-02-28 LAB — CBC WITH DIFFERENTIAL/PLATELET
BASOS PCT: 1 %
Basophils Absolute: 0 10*3/uL (ref 0–0.1)
EOS ABS: 0 10*3/uL (ref 0–0.7)
EOS PCT: 0 %
HCT: 29.8 % — ABNORMAL LOW (ref 35.0–47.0)
HEMOGLOBIN: 10.4 g/dL — AB (ref 12.0–16.0)
LYMPHS ABS: 0.2 10*3/uL — AB (ref 1.0–3.6)
Lymphocytes Relative: 2 %
MCH: 30.8 pg (ref 26.0–34.0)
MCHC: 35 g/dL (ref 32.0–36.0)
MCV: 88 fL (ref 80.0–100.0)
Monocytes Absolute: 0.2 10*3/uL (ref 0.2–0.9)
Monocytes Relative: 2 %
NEUTROS PCT: 95 %
Neutro Abs: 9.4 10*3/uL — ABNORMAL HIGH (ref 1.4–6.5)
PLATELETS: 161 10*3/uL (ref 150–440)
RBC: 3.38 MIL/uL — AB (ref 3.80–5.20)
RDW: 14.1 % (ref 11.5–14.5)
WBC: 9.8 10*3/uL (ref 3.6–11.0)

## 2016-02-28 LAB — BASIC METABOLIC PANEL
ANION GAP: 8 (ref 5–15)
BUN: 14 mg/dL (ref 6–20)
CALCIUM: 8.8 mg/dL — AB (ref 8.9–10.3)
CHLORIDE: 105 mmol/L (ref 101–111)
CO2: 29 mmol/L (ref 22–32)
CREATININE: 0.85 mg/dL (ref 0.44–1.00)
GFR calc Af Amer: >60 mL/min (ref >60)
GFR calc non Af Amer: >60 mL/min (ref >60)
Glucose, Bld: 141 mg/dL — ABNORMAL HIGH (ref 65–99)
POTASSIUM: 3.6 mmol/L (ref 3.5–5.1)
SODIUM: 142 mmol/L (ref 135–145)

## 2016-02-28 LAB — URINE PH
PH: 7 (ref 5.0–8.0)
PH: 8 (ref 5.0–8.0)
PH: 8 (ref 5.0–8.0)
PH: 8 (ref 5.0–8.0)
PH: 8 (ref 5.0–8.0)
PH: 8 (ref 5.0–8.0)
PH: 8 (ref 5.0–8.0)
pH: 6 (ref 5.0–8.0)
pH: 7 (ref 5.0–8.0)
pH: 8 (ref 5.0–8.0)
pH: 8 (ref 5.0–8.0)
pH: 8 (ref 5.0–8.0)
pH: 9 — ABNORMAL HIGH (ref 5.0–8.0)

## 2016-02-28 MED ORDER — LEUCOVORIN CALCIUM INJECTION 100 MG
10.0000 mg/m2 | Freq: Four times a day (QID) | INTRAMUSCULAR | Status: DC
  Administered 2016-02-28 – 2016-03-03 (×14): 18 mg via INTRAVENOUS
  Filled 2016-02-28 (×17): qty 0.9

## 2016-02-28 MED ORDER — ACYCLOVIR 400 MG PO TABS: 400.0000 mg | ORAL_TABLET | Freq: Two times a day (BID) | ORAL | 3 refills | Status: DC

## 2016-02-28 MED ORDER — ACYCLOVIR 200 MG PO CAPS
400.0000 mg | ORAL_CAPSULE | Freq: Two times a day (BID) | ORAL | Status: DC
  Administered 2016-02-28 – 2016-03-01 (×6): 400 mg via ORAL
  Filled 2016-02-28 (×6): qty 2

## 2016-02-28 NOTE — Progress Notes (Addendum)
MEDICATION RELATED CONSULT NOTE - INITIAL   Pharmacy Consult for  High- Dose Methotrexate monitoring Indication: Monitoring of methotrexate levels and ordering leucovorin doses.   No Known Allergies  Patient Measurements: Height: 5\' 2"  (157.5 cm) Weight: 154 lb 9.6 oz (70.1 kg) IBW/kg (Calculated) : 50.1 Adjusted Body Weight:    Vital Signs: Temp: 97.7 F (36.5 C) (10/17 0426) BP: 119/52 (10/17 0426) Pulse Rate: 57 (10/17 0426) Intake/Output from previous day: 10/16 0701 - 10/17 0700 In: 0  Out: 2950 [Urine:2950] Intake/Output from this shift: Total I/O In: 0  Out: 1875 [Urine:1875]  Labs:  Recent Labs  02/27/16 0935 02/28/16 0351  WBC 3.9 9.8  HGB 11.6* 10.4*  HCT 32.4* 29.8*  PLT 186 161  CREATININE 0.83 0.85  ALBUMIN 4.1  --   PROT 6.3*  --   AST 22  --   ALT 19  --   ALKPHOS 54  --   BILITOT 0.8  --    Estimated Creatinine Clearance: 63.7 mL/min (by C-G formula based on SCr of 0.85 mg/dL).   Microbiology: No results found for this or any previous visit (from the past 720 hour(s)).  Medical History: Past Medical History:  Diagnosis Date  . Arthritis   . Blood dyscrasia   . Cancer ([place])   . Chest pain, unspecified   . Diverticulosis 07/06/15  . Dysrhythmia   . Esophagitis 07/06/15  . Fatigue   . Gastritis 07/06/15  . GERD (gastroesophageal reflux disease)   . 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 Leg cramps   . Lymphoma ([place]) 09/14/15   Monoclonal B cell lymphoma  . Night sweats   . Osteoporosis   . Overweight(278.02)    Obesity  . Palpitations   . Shortness of breath dyspnea   . Tachycardia   . Thrombocytopenia (HCC)     Medications:  Scheduled:  . acyclovir  400 mg Oral BID  . apixaban  5 mg Oral BID  . leucovorin  10 mg/m2 Intravenous Q6H  . pantoprazole  40 mg Oral BID AC   Infusions:  .  sodium bicarbonate infusion 1/4 NS 1000 mL 150 mL/hr at 02/28/16 1008    Assessment: 60 yo female to start on high dose methotrexate infusion  with Leucovorin rescue, hx large B-cell Lymphoma.  Plan:  Per Dr. Rogue Bussing, do not start the methotrexate infusion until urine pH is at 7.0 and maintain> 7.0 during therapy; NaBicarb drip to continue at least 48h after MTX infusion. Per MD, check urine pH at least daily.   Sodium Bicarb Drip:  Goal Urine pH > 7.0 If urine pH less than goal consider increasing NaBicarb drip by 25 ml/hr, or if rate exceeds 175 ml/hr on the current NaBicarb drip, may need to increase bicarb in bag to 150 mEq. Checking urine pH every hour until pH >7.0 then once stabilizes consider checking every 2 hrs then every 4hrs etc . Care order instruction for RN to enter urine pH orders. Continue at least 48 hrs after Methotrexate infusion finished.  10/17 Urine pH has been stable at ~8.0 since increase in drip rate. Will continue with current rate and ask RN to move to q4h pH checks.  Methotrexate Levels:  Pt needs MTX level ordered at 24h after start of infusion (i.e. 20 h after the end of infusion b/c infusion runs over 4 hrs).  Based on MTX level, leucovorin dose will need to be ordered and adjusted based on the daily MTX level.  Pt will need daily MTX  levels and urine pH monitoring until MTX levels are <0.1 microM. (Note: Labs are sent to [facility] so takes 4-6 hrs to get result). Results are faxed? Can also find by going under "Chart Review" then "Media" and look for "Lab result scan".  10/17 MTX level ordered for 18:00 today, ~24 hours after start of infusion. Level = 3.6. Continue current leucovorin regimen. 10/18 f/u MTX level ordered for 18:00 today.  Leucovorin: Follow Infomation for High-dose methotrexate-rescue- Leucovorin dosing (see [email or web address]).  10/17 will initiate Leucovorin rescue with 10mg /m2 IV q6h. Will adjust dose based on Methotrexate levels.  Paulina Fusi, PharmD, BCPS 02/28/2016 3:08 PM

## 2016-02-28 NOTE — Progress Notes (Signed)
     Day 2 cycle 3 MTX prophylaxis for high risk DLBCL  She is doing well, no complaints, specifically no fever, blurred vision, no pain, no headaches Urinating freely No hematuria No bone pains,. No skin rashes. No paresthesias.  Vitals are stable, afebrile Exam shows no peirpheral edema, mild pallor, no thrush or mouth sores No palpble lymph nodes Neck supple, No focal motor or sensory deficits  Sinus rhythm, no respiratory distress  Urine pH 8  Remains on biacarb drip Urine output greater than 100 ml /hr (Approximately 1850 over 10 HOURS) Cbc is stable, CMP shows stable creatinine.  meds reviewed, no potential interatction noted with MTX   Impression and plan:  DLBCL, in remission, now in the midst of CNS porphylaxis To begin Leucovorin rescue today Continue to monitor urine output,mtx level and urine ph. Dr. Gerhard Munch will follow from tomorrow.

## 2016-02-29 DIAGNOSIS — R944 Abnormal results of kidney function studies: Secondary | ICD-10-CM

## 2016-02-29 LAB — CBC WITH DIFFERENTIAL/PLATELET
Basophils Absolute: 0 10*3/uL (ref 0–0.1)
Basophils Relative: 1 %
EOS ABS: 0 10*3/uL (ref 0–0.7)
EOS PCT: 0 %
HCT: 27.9 % — ABNORMAL LOW (ref 35.0–47.0)
Hemoglobin: 9.8 g/dL — ABNORMAL LOW (ref 12.0–16.0)
LYMPHS ABS: 0.5 10*3/uL — AB (ref 1.0–3.6)
Lymphocytes Relative: 13 %
MCH: 31.2 pg (ref 26.0–34.0)
MCHC: 35 g/dL (ref 32.0–36.0)
MCV: 89.2 fL (ref 80.0–100.0)
MONO ABS: 0.5 10*3/uL (ref 0.2–0.9)
MONOS PCT: 13 %
Neutro Abs: 3 10*3/uL (ref 1.4–6.5)
Neutrophils Relative %: 73 %
PLATELETS: 130 10*3/uL — AB (ref 150–440)
RBC: 3.13 MIL/uL — ABNORMAL LOW (ref 3.80–5.20)
RDW: 14.1 % (ref 11.5–14.5)
WBC: 4.1 10*3/uL (ref 3.6–11.0)

## 2016-02-29 LAB — BASIC METABOLIC PANEL
Anion gap: 5 (ref 5–15)
BUN: 13 mg/dL (ref 6–20)
CHLORIDE: 104 mmol/L (ref 101–111)
CO2: 37 mmol/L — ABNORMAL HIGH (ref 22–32)
CREATININE: 1.18 mg/dL — AB (ref 0.44–1.00)
Calcium: 8.5 mg/dL — ABNORMAL LOW (ref 8.9–10.3)
GFR calc Af Amer: 56 mL/min — ABNORMAL LOW (ref >60)
GFR, EST NON AFRICAN AMERICAN: 49 mL/min — AB (ref >60)
GLUCOSE: 88 mg/dL (ref 65–99)
Potassium: 3.1 mmol/L — ABNORMAL LOW (ref 3.5–5.1)
SODIUM: 146 mmol/L — AB (ref 135–145)

## 2016-02-29 LAB — URINE PH
PH: 8 (ref 5.0–8.0)
pH: 9 — ABNORMAL HIGH (ref 5.0–8.0)
pH: 9 — ABNORMAL HIGH (ref 5.0–8.0)
pH: 8 (ref 5.0–8.0)

## 2016-02-29 MED ORDER — POTASSIUM CHLORIDE CRYS ER 20 MEQ PO TBCR
40.0000 meq | EXTENDED_RELEASE_TABLET | Freq: Two times a day (BID) | ORAL | Status: DC
  Administered 2016-02-29 – 2016-03-03 (×6): 40 meq via ORAL
  Filled 2016-02-29 (×6): qty 2

## 2016-02-29 MED ORDER — FAMOTIDINE 20 MG PO TABS
20.0000 mg | ORAL_TABLET | Freq: Two times a day (BID) | ORAL | Status: DC
  Administered 2016-02-29 – 2016-03-03 (×6): 20 mg via ORAL
  Filled 2016-02-29 (×6): qty 1

## 2016-02-29 NOTE — Progress Notes (Signed)
Stacy Murillo   DOB:1955/12/19   [id number]    Subjective: day #3 for high dose MXT.   Objective: Patient continues to urinate frequently. She denies any difficulty breathing. Denies any nausea vomiting. Mild constipation. Vitals:   02/29/16 0555 02/29/16 1241  BP: (!) 106/49 124/67  Pulse: 65 65  Resp: 19 18  Temp: 98.4 F (36.9 C) 98.2 F (36.8 C)     Intake/Output Summary (Last 24 hours) at 02/29/16 1722 Last data filed at 02/29/16 1600  Gross per 24 hour  Intake             1750 ml  Output             5000 ml  Net            -3250 ml    GENERAL Alert, no distress and comfortable. EYES: no pallor or icterus OROPHARYNX: no thrush or ulceration. NECK: supple, no masses felt LYMPH:  no palpable lymphadenopathy in the cervical, axillary or inguinal regions LUNGS: decreased breath sounds to auscultation at bases and  No wheeze or crackles HEART/CVS: regular rate & rhythm and no murmurs; No lower extremity edema ABDOMEN: abdomen soft, tender  on deep palpation. and normal bowel sounds Musculoskeletal:no cyanosis of digits and no clubbing  PSYCH: alert & oriented x 3 with fluent speech NEURO: no focal motor/sensory deficits SKIN:  no rashes or significant lesions   Labs:  Lab Results  Component Value Date   WBC 4.1 02/29/2016   HGB 9.8 (L) 02/29/2016   HCT 27.9 (L) 02/29/2016   MCV 89.2 02/29/2016   PLT 130 (L) 02/29/2016   NEUTROABS 3.0 02/29/2016    Lab Results  Component Value Date   NA 146 (H) 02/29/2016   K 3.1 (L) 02/29/2016   CL 104 02/29/2016   CO2 37 (H) 02/29/2016    Studies:  No results found.  Assessment & Plan:  # 60 year old female patient with a history of diffuse large B-cell lymphoma/involving peripheral blood status post 6 cycles of R CHOP currently admitted to the hospital for high-dose methotrexate cycle #3; day #3 today.    # Intracranial prophylaxis-s/p #3 high-dose methotrexate- 24 hours -3.6; post MXT- And adjust leucovorin based on  Mxt levels. Appreciate pharmacy monitoring.   # Creatinine- slightly up at 1.18; will monitor closely; hypokalemia- K dur 40 BID.   # Diffuse large B-cell lymphoma status post 6 cycles of R CHOP- PET scan shows complete response. Mild bone marrow uptake likely reactive.   # I reviewed the blood work- with the patient in detail  Cammie Sickle, MD 02/29/2016  5:22 PM

## 2016-03-01 DIAGNOSIS — D61818 Other pancytopenia: Secondary | ICD-10-CM

## 2016-03-01 DIAGNOSIS — E876 Hypokalemia: Secondary | ICD-10-CM

## 2016-03-01 LAB — URINE PH
pH: 9 — ABNORMAL HIGH (ref 5.0–8.0)
pH: 9 — ABNORMAL HIGH (ref 5.0–8.0)
pH: 9 — ABNORMAL HIGH (ref 5.0–8.0)
pH: 9 — ABNORMAL HIGH (ref 5.0–8.0)
pH: 9 — ABNORMAL HIGH (ref 5.0–8.0)

## 2016-03-01 LAB — CBC WITH DIFFERENTIAL/PLATELET
BASOS ABS: 0 10*3/uL (ref 0–0.1)
Basophils Relative: 1 %
Eosinophils Absolute: 0 10*3/uL (ref 0–0.7)
Eosinophils Relative: 2 %
HEMATOCRIT: 25.1 % — AB (ref 35.0–47.0)
Hemoglobin: 8.8 g/dL — ABNORMAL LOW (ref 12.0–16.0)
LYMPHS ABS: 0.4 10*3/uL — AB (ref 1.0–3.6)
LYMPHS PCT: 24 %
MCH: 31.5 pg (ref 26.0–34.0)
MCHC: 35.3 g/dL (ref 32.0–36.0)
MCV: 89.3 fL (ref 80.0–100.0)
MONO ABS: 0.1 10*3/uL — AB (ref 0.2–0.9)
MONOS PCT: 5 %
NEUTROS ABS: 1.2 10*3/uL — AB (ref 1.4–6.5)
Neutrophils Relative %: 68 %
Platelets: 107 10*3/uL — ABNORMAL LOW (ref 150–440)
RBC: 2.81 MIL/uL — ABNORMAL LOW (ref 3.80–5.20)
RDW: 14.5 % (ref 11.5–14.5)
WBC: 1.8 10*3/uL — ABNORMAL LOW (ref 3.6–11.0)

## 2016-03-01 LAB — BASIC METABOLIC PANEL
ANION GAP: 3 — AB (ref 5–15)
BUN: 9 mg/dL (ref 6–20)
CALCIUM: 8.2 mg/dL — AB (ref 8.9–10.3)
CO2: 38 mmol/L — AB (ref 22–32)
Chloride: 103 mmol/L (ref 101–111)
Creatinine, Ser: 0.95 mg/dL (ref 0.44–1.00)
GFR calc Af Amer: >60 mL/min (ref >60)
GFR calc non Af Amer: >60 mL/min (ref >60)
GLUCOSE: 91 mg/dL (ref 65–99)
Potassium: 3 mmol/L — ABNORMAL LOW (ref 3.5–5.1)
Sodium: 144 mmol/L (ref 135–145)

## 2016-03-01 NOTE — Progress Notes (Signed)
Stacy Murillo   DOB:1955/10/13   [id number]    Subjective: Patient's appetite is good. No nausea no vomiting. No fevers. No chills. Patient has been urinating well. Complains of facial puffiness.  Objective:  Vitals:   02/29/16 2002 03/01/16 0357  BP: (!) 131/54 (!) 118/49  Pulse: 92 73  Resp: 20 20  Temp: 98.1 F (36.7 C) 98.3 F (36.8 C)     Intake/Output Summary (Last 24 hours) at 03/01/16 0925 Last data filed at 03/01/16 0307  Gross per 24 hour  Intake             4943 ml  Output             4050 ml  Net              893 ml    GENERAL Alert, no distress and comfortable. O2 EYES: no pallor or icterus OROPHARYNX: no thrush or ulceration. NECK: supple, no masses felt LYMPH:  no palpable lymphadenopathy in the cervical, axillary or inguinal regions LUNGS: decreased breath sounds to auscultation at bases and  No wheeze or crackles HEART/CVS: regular rate & rhythm and no murmurs; No lower extremity edema ABDOMEN: abdomen soft, tender  on deep palpation. and normal bowel sounds Musculoskeletal:no cyanosis of digits and no clubbing  PSYCH: alert & oriented x 3 with fluent speech NEURO: no focal motor/sensory deficits SKIN:  no rashes or significant lesions   Labs:  Lab Results  Component Value Date   WBC 1.8 (L) 03/01/2016   HGB 8.8 (L) 03/01/2016   HCT 25.1 (L) 03/01/2016   MCV 89.3 03/01/2016   PLT 107 (L) 03/01/2016   NEUTROABS 1.2 (L) 03/01/2016    Lab Results  Component Value Date   NA 144 03/01/2016   K 3.0 (L) 03/01/2016   CL 103 03/01/2016   CO2 38 (H) 03/01/2016    Studies:  No results found.  Assessment & Plan:  # 60 year old female patient with a history of diffuse large B-cell lymphoma/involving peripheral blood status post 6cycles of R CHOP currently admitted to the hospital for high-dose methotrexate cycle #3; day #4 today.    # Intracranial prophylaxis-s/p #3 high-dose methotrexate- 24 hours -3.6; 48h MXTpost- 0.28 - adjust leucovorin based  on Mxt levels. Appreciate pharmacy monitoring.   # pancytopenia- sec to MXT- monitor for now; ANC-1.2. Hold neupogen for now.   # Creatinine- yesterday- 1.18; today improved at-0.95 ; will monitor closely; hypokalemia- K dur 40 BID.   # Diffuse large B-cell lymphoma status post 6 cycles of R CHOP- PET scan shows complete response. Mild bone marrow uptake likely reactive.  # I reviewed the blood work- with the patient in detail; Discussed with RN.    Cammie Sickle, MD 03/01/2016  9:25 AM

## 2016-03-01 NOTE — Progress Notes (Addendum)
MEDICATION RELATED CONSULT NOTE - INITIAL   Pharmacy Consult for  High- Dose Methotrexate monitoring Indication: Monitoring of methotrexate levels and ordering leucovorin doses.   No Known Allergies  Patient Measurements: Height: 5\' 2"  (157.5 cm) Weight: 154 lb 9.6 oz (70.1 kg) IBW/kg (Calculated) : 50.1 Adjusted Body Weight:    Vital Signs: Temp: 98.2 F (36.8 C) (10/19 1410) Temp Source: Oral (10/19 1410) BP: 136/60 (10/19 1410) Pulse Rate: 70 (10/19 1410) Intake/Output from previous day: 10/18 0701 - 10/19 0700 In: 4943 [I.V.:4943] Out: 5350 [Urine:5350] Intake/Output from this shift: Total I/O In: -  Out: 2250 [Urine:2250]  Labs:  Recent Labs  02/28/16 0351 02/29/16 0556 03/01/16 0609  WBC 9.8 4.1 1.8*  HGB 10.4* 9.8* 8.8*  HCT 29.8* 27.9* 25.1*  PLT 161 130* 107*  CREATININE 0.85 1.18* 0.95   Estimated Creatinine Clearance: 57 mL/min (by C-G formula based on SCr of 0.95 mg/dL).   Microbiology: No results found for this or any previous visit (from the past 720 hour(s)).  Medical History: Past Medical History:  Diagnosis Date  . Arthritis   . Blood dyscrasia   . Cancer ([place])   . Chest pain, unspecified   . Diverticulosis 07/06/15  . Dysrhythmia   . Esophagitis 07/06/15  . Fatigue   . Gastritis 07/06/15  . GERD (gastroesophageal reflux disease)   . 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 Leg cramps   . Lymphoma ([place]) 09/14/15   Monoclonal B cell lymphoma  . Night sweats   . Osteoporosis   . Overweight(278.02)    Obesity  . Palpitations   . Shortness of breath dyspnea   . Tachycardia   . Thrombocytopenia (HCC)     Medications:  Scheduled:  . acyclovir  400 mg Oral BID  . apixaban  5 mg Oral BID  . famotidine  20 mg Oral BID  . leucovorin  10 mg/m2 Intravenous Q6H  . potassium chloride  40 mEq Oral BID   Infusions:  .  sodium bicarbonate infusion 1/4 NS 1000 mL 125 mL/hr at 03/01/16 0946    Assessment: 60 yo female to start on high dose  methotrexate infusion with Leucovorin rescue, hx large B-cell Lymphoma.  Plan:  Per Dr. Rogue Bussing, do not start the methotrexate infusion until urine pH is at 7.0 and maintain> 7.0 during therapy; NaBicarb drip to continue at least 48h after MTX infusion. Per MD, check urine pH at least daily.   Sodium Bicarb Drip:  Goal Urine pH > 7.0 If urine pH less than goal consider increasing NaBicarb drip by 25 ml/hr, or if rate exceeds 175 ml/hr on the current NaBicarb drip, may need to increase bicarb in bag to 150 mEq. Checking urine pH every hour until pH >7.0 then once stabilizes consider checking every 2 hrs then every 4hrs etc . Care order instruction for RN to enter urine pH orders. Continue at least 48 hrs after Methotrexate infusion finished.  10/17 Urine pH has been stable at ~8.0 since increase in drip rate. Will continue with current rate and ask RN to move to q4h pH checks.  10/19 Urine pH has been stable at ~9.0. Will decrease IV rate to 136ml/hr and move urine pH checks to once per shift.  Methotrexate Levels:  Pt needs MTX level ordered at 24h after start of infusion (i.e. 20 h after the end of infusion b/c infusion runs over 4 hrs).  Based on MTX level, leucovorin dose will need to be ordered and adjusted based on the daily  MTX level.  Pt will need daily MTX levels and urine pH monitoring until MTX levels are <0.1 microM. (Note: Labs are sent to [facility] so takes 4-6 hrs to get result). Results are faxed? Can also find by going under "Chart Review" then "Media" and look for "Lab result scan".  10/17 MTX level ordered for 18:00 today, ~24 hours after start of infusion. Level = 3.6. Continue current leucovorin regimen. 10/18 f/u MTX level ordered for 18:00 today. Level = 0.28. Continue current leucovorin regimen. F/u level ordered for 18:00 today.  10/19 PM MTX level 0.13. Continue current leucovorin regimen. F/u level ordered for 10/20 at 18:00  Leucovorin: Follow Infomation for  High-dose methotrexate-rescue- Leucovorin dosing (see [email or web address]).  10/17 will initiate Leucovorin rescue with 10mg /m2 IV q6h. Will adjust dose based on Methotrexate levels.  Paulina Fusi, PharmD, BCPS 03/01/2016 4:38 PM

## 2016-03-01 NOTE — Progress Notes (Signed)
MEDICATION RELATED CONSULT NOTE - INITIAL   Pharmacy Consult for  High- Dose Methotrexate monitoring Indication: Monitoring of methotrexate levels and ordering leucovorin doses.   No Known Allergies  Patient Measurements: Height: 5\' 2"  (157.5 cm) Weight: 154 lb 9.6 oz (70.1 kg) IBW/kg (Calculated) : 50.1 Adjusted Body Weight:    Vital Signs: Temp: 98.3 F (36.8 C) (10/19 0357) Temp Source: Oral (10/19 0357) BP: 118/49 (10/19 0357) Pulse Rate: 73 (10/19 0357) Intake/Output from previous day: 10/18 0701 - 10/19 0700 In: [id number] [I.V.:4943] Out: 5350 [Urine:5350] Intake/Output from this shift: Total I/O In: 1265 [I.V.:1265] Out: 1350 [Urine:1350]  Labs:  Recent Labs  02/27/16 0935 02/28/16 0351 02/29/16 0556  WBC 3.9 9.8 4.1  HGB 11.6* 10.4* 9.8*  HCT 32.4* 29.8* 27.9*  PLT 186 161 130*  CREATININE 0.83 0.85 1.18*  ALBUMIN 4.1  --   --   PROT 6.3*  --   --   AST 22  --   --   ALT 19  --   --   ALKPHOS 54  --   --   BILITOT 0.8  --   --    Estimated Creatinine Clearance: 45.9 mL/min (by C-G formula based on SCr of 1.18 mg/dL (H)).   Microbiology: No results found for this or any previous visit (from the past 720 hour(s)).  Medical History: Past Medical History:  Diagnosis Date  . Arthritis   . Blood dyscrasia   . Cancer ([place])   . Chest pain, unspecified   . Diverticulosis 07/06/15  . Dysrhythmia   . Esophagitis 07/06/15  . Fatigue   . Gastritis 07/06/15  . GERD (gastroesophageal reflux disease)   . 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 Leg cramps   . Lymphoma ([place]) 09/14/15   Monoclonal B cell lymphoma  . Night sweats   . Osteoporosis   . Overweight(278.02)    Obesity  . Palpitations   . Shortness of breath dyspnea   . Tachycardia   . Thrombocytopenia (HCC)     Medications:  Scheduled:  . acyclovir  400 mg Oral BID  . apixaban  5 mg Oral BID  . famotidine  20 mg Oral BID  . leucovorin  10 mg/m2 Intravenous Q6H  . potassium chloride  40 mEq Oral  BID   Infusions:  .  sodium bicarbonate infusion 1/4 NS 1000 mL 150 mL/hr at 03/01/16 0250    Assessment: 60 yo female to start on high dose methotrexate infusion with Leucovorin rescue, hx large B-cell Lymphoma.  Plan:  Per Dr. Rogue Bussing, do not start the methotrexate infusion until urine pH is at 7.0 and maintain> 7.0 during therapy; NaBicarb drip to continue at least 48h after MTX infusion. Per MD, check urine pH at least daily.   Sodium Bicarb Drip:  Goal Urine pH > 7.0 If urine pH less than goal consider increasing NaBicarb drip by 25 ml/hr, or if rate exceeds 175 ml/hr on the current NaBicarb drip, may need to increase bicarb in bag to 150 mEq. Checking urine pH every hour until pH >7.0 then once stabilizes consider checking every 2 hrs then every 4hrs etc . Care order instruction for RN to enter urine pH orders. Continue at least 48 hrs after Methotrexate infusion finished.  10/17 Urine pH has been stable at ~8.0 since increase in drip rate. Will continue with current rate and ask RN to move to q4h pH checks.  Methotrexate Levels:  Pt needs MTX level ordered at 24h after start of infusion (i.e.  20 h after the end of infusion b/c infusion runs over 4 hrs).  Based on MTX level, leucovorin dose will need to be ordered and adjusted based on the daily MTX level.  Pt will need daily MTX levels and urine pH monitoring until MTX levels are <0.1 microM. (Note: Labs are sent to [facility] so takes 4-6 hrs to get result). Results are faxed? Can also find by going under "Chart Review" then "Media" and look for "Lab result scan".  10/17 MTX level ordered for 18:00 today, ~24 hours after start of infusion. Level = 3.6. Continue current leucovorin regimen. 10/18 f/u MTX level ordered for 18:00 today. Level = 0.28. Continue current leucovorin regimen. F/u level ordered for 18:00 today.  Leucovorin: Follow Infomation for High-dose methotrexate-rescue- Leucovorin dosing (see [email or web address]).  10/17 will  initiate Leucovorin rescue with 10mg /m2 IV q6h. Will adjust dose based on Methotrexate levels.  Paulina Fusi, PharmD, BCPS 03/01/2016 4:04 AM

## 2016-03-02 LAB — BASIC METABOLIC PANEL
Anion gap: 3 — ABNORMAL LOW (ref 5–15)
BUN: 9 mg/dL (ref 6–20)
CALCIUM: 8.3 mg/dL — AB (ref 8.9–10.3)
CHLORIDE: 104 mmol/L (ref 101–111)
CO2: 34 mmol/L — AB (ref 22–32)
CREATININE: 1.07 mg/dL — AB (ref 0.44–1.00)
GFR calc non Af Amer: 55 mL/min — ABNORMAL LOW (ref >60)
GLUCOSE: 92 mg/dL (ref 65–99)
Potassium: 3.3 mmol/L — ABNORMAL LOW (ref 3.5–5.1)
Sodium: 141 mmol/L (ref 135–145)

## 2016-03-02 LAB — CBC WITH DIFFERENTIAL/PLATELET
BASOS PCT: 1 %
Basophils Absolute: 0 10*3/uL (ref 0–0.1)
Eosinophils Absolute: 0.1 10*3/uL (ref 0–0.7)
Eosinophils Relative: 4 %
HEMATOCRIT: 26 % — AB (ref 35.0–47.0)
HEMOGLOBIN: 9 g/dL — AB (ref 12.0–16.0)
LYMPHS ABS: 0.4 10*3/uL — AB (ref 1.0–3.6)
LYMPHS PCT: 26 %
MCH: 31.2 pg (ref 26.0–34.0)
MCHC: 34.4 g/dL (ref 32.0–36.0)
MCV: 90.6 fL (ref 80.0–100.0)
MONO ABS: 0 10*3/uL — AB (ref 0.2–0.9)
MONOS PCT: 3 %
NEUTROS ABS: 1.1 10*3/uL — AB (ref 1.4–6.5)
NEUTROS PCT: 66 %
Platelets: 95 10*3/uL — ABNORMAL LOW (ref 150–440)
RBC: 2.87 MIL/uL — ABNORMAL LOW (ref 3.80–5.20)
RDW: 13.7 % (ref 11.5–14.5)
WBC: 1.6 10*3/uL — ABNORMAL LOW (ref 3.6–11.0)

## 2016-03-02 LAB — URINE PH
PH: 9 — AB (ref 5.0–8.0)
pH: 9 — ABNORMAL HIGH (ref 5.0–8.0)

## 2016-03-02 MED ORDER — LORATADINE 10 MG PO TABS
10.0000 mg | ORAL_TABLET | Freq: Every day | ORAL | Status: DC
  Administered 2016-03-02 – 2016-03-03 (×2): 10 mg via ORAL
  Filled 2016-03-02 (×2): qty 1

## 2016-03-02 MED ORDER — TBO-FILGRASTIM 480 MCG/0.8ML ~~LOC~~ SOSY
480.0000 ug | PREFILLED_SYRINGE | Freq: Every day | SUBCUTANEOUS | Status: DC
  Administered 2016-03-02 – 2016-03-03 (×2): 480 ug via SUBCUTANEOUS
  Filled 2016-03-02 (×3): qty 0.8

## 2016-03-02 NOTE — Progress Notes (Signed)
Stacy Murillo attempted visitation but was not a good time. I provided silent prayer at the door.    03/02/16 1200  Clinical Encounter Type  Visited With Patient not available  Visit Type Initial;Spiritual support  Referral From Nurse  Spiritual Encounters  Spiritual Needs Prayer

## 2016-03-02 NOTE — Progress Notes (Signed)
Stacy Murillo   DOB:May 24, 1955   [id number]    Subjective: Patient's appetite is good. No nausea no vomiting. No fevers. No chills. Felt heart racing last night. No chest pain. No SOB.  No sores in the mouth.   Objective:  Vitals:   03/01/16 2014 03/02/16 0439  BP: 126/62 (!) 150/67  Pulse: 70 (!) 58  Resp: 19 20  Temp: 98.5 F (36.9 C) 98.6 F (37 C)     Intake/Output Summary (Last 24 hours) at 03/02/16 0759 Last data filed at 03/02/16 0157  Gross per 24 hour  Intake                0 ml  Output             5050 ml  Net            -5050 ml    GENERAL Alert, no distress and comfortable. She is alone.  EYES: no pallor or icterus OROPHARYNX: no thrush or ulceration. NECK: supple, no masses felt LYMPH:  no palpable lymphadenopathy in the cervical, axillary or inguinal regions LUNGS: decreased breath sounds to auscultation at bases and  No wheeze or crackles HEART/CVS: regular rate & rhythm and no murmurs; No lower extremity edema ABDOMEN: abdomen soft, tender  on deep palpation. and normal bowel sounds Musculoskeletal:no cyanosis of digits and no clubbing  PSYCH: alert & oriented x 3 with fluent speech NEURO: no focal motor/sensory deficits SKIN:  no rashes or significant lesions   Labs:  Lab Results  Component Value Date   WBC 1.6 (L) 03/02/2016   HGB 9.0 (L) 03/02/2016   HCT 26.0 (L) 03/02/2016   MCV 90.6 03/02/2016   PLT 95 (L) 03/02/2016   NEUTROABS 1.1 (L) 03/02/2016    Lab Results  Component Value Date   NA 141 03/02/2016   K 3.3 (L) 03/02/2016   CL 104 03/02/2016   CO2 34 (H) 03/02/2016    Studies:  No results found.  Assessment & Plan:  # 60 year old female patient with a history of diffuse large B-cell lymphoma/involving peripheral blood status post 6cycles of R CHOP currently admitted to the hospital for high-dose methotrexate cycle #3; day #5 today.    # Intracranial prophylaxis-s/p #3 high-dose methotrexate- 24 hours -3.6; 48h MXTpost- 0.28; 72  hours post- 0.12;  adjust leucovorin based on Mxt levels. Appreciate pharmacy monitoring. Goal MXT level- 0.1- 0.05  # pancytopenia- sec to MXT- monitor for now; ANC-1.1. Start neupogen today; daily. Platelets- 95- monitor for now.   # Creatinine- today 1.07 ; will monitor closely; hypokalemia- K dur 40 BID.   # Diffuse large B-cell lymphoma status post 6 cycles of R CHOP- PET scan shows complete response. Mild bone marrow uptake likely reactive.  # I reviewed the blood work- with the patient in detail   Cammie Sickle, MD 03/02/2016  7:59 AM

## 2016-03-03 LAB — CBC WITH DIFFERENTIAL/PLATELET
BASOS PCT: 0 %
Basophils Absolute: 0 10*3/uL (ref 0–0.1)
EOS ABS: 0.1 10*3/uL (ref 0–0.7)
Eosinophils Relative: 1 %
HCT: 26.1 % — ABNORMAL LOW (ref 35.0–47.0)
HEMOGLOBIN: 9.1 g/dL — AB (ref 12.0–16.0)
Lymphocytes Relative: 3 %
Lymphs Abs: 0.4 10*3/uL — ABNORMAL LOW (ref 1.0–3.6)
MCH: 31 pg (ref 26.0–34.0)
MCHC: 34.9 g/dL (ref 32.0–36.0)
MCV: 88.7 fL (ref 80.0–100.0)
Monocytes Absolute: 0.3 10*3/uL (ref 0.2–0.9)
Monocytes Relative: 2 %
NEUTROS PCT: 94 %
Neutro Abs: 12 10*3/uL — ABNORMAL HIGH (ref 1.4–6.5)
Platelets: 102 10*3/uL — ABNORMAL LOW (ref 150–440)
RBC: 2.94 MIL/uL — AB (ref 3.80–5.20)
RDW: 13.8 % (ref 11.5–14.5)
WBC: 12.7 10*3/uL — AB (ref 3.6–11.0)

## 2016-03-03 LAB — BASIC METABOLIC PANEL
ANION GAP: 4 — AB (ref 5–15)
BUN: 7 mg/dL (ref 6–20)
CHLORIDE: 106 mmol/L (ref 101–111)
CO2: 33 mmol/L — ABNORMAL HIGH (ref 22–32)
Calcium: 8.6 mg/dL — ABNORMAL LOW (ref 8.9–10.3)
Creatinine, Ser: 1.01 mg/dL — ABNORMAL HIGH (ref 0.44–1.00)
GFR calc non Af Amer: 59 mL/min — ABNORMAL LOW (ref >60)
Glucose, Bld: 89 mg/dL (ref 65–99)
POTASSIUM: 3.6 mmol/L (ref 3.5–5.1)
SODIUM: 143 mmol/L (ref 135–145)

## 2016-03-03 MED ORDER — SENNOSIDES-DOCUSATE SODIUM 8.6-50 MG PO TABS: 1.0000 | ORAL_TABLET | Freq: Every evening | ORAL | 2 refills | Status: AC | PRN

## 2016-03-03 MED ORDER — HEPARIN SOD (PORK) LOCK FLUSH 100 UNIT/ML IV SOLN
500.0000 [IU] | Freq: Once | INTRAVENOUS | Status: AC
  Administered 2016-03-03: 500 [IU] via INTRAVENOUS
  Filled 2016-03-03: qty 5

## 2016-03-03 NOTE — Progress Notes (Signed)
Patient discharged home per MD order. All discharge instructions given and all questions answered. 

## 2016-03-03 NOTE — Discharge Summary (Signed)
Stacy Murillo  Physician Discharge Summary   Patient ID: Stacy Murillo [id number] 59 y.o. Oct 26, 1955  Admit date: 02/27/2016  Discharge date and time: No discharge date for patient encounter.   Admitting Physician: Cammie Sickle, MD   Discharge Physician: Creola Corn MD   Admission Diagnoses: high dose methotrexate  Discharge Diagnoses: . 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 She recived high dose MTX, 24 hrs after that , she began Leucovoirn rescue. She received IV fluids and Sodum Bicarbonate infusions to maintain vigorous urine output and alkaline urine PH She developed neutropenia for which she was given Neupogen. she remained afebrile and without any incident Her kidney function remained stable  Her MTX levels reached 0.09 on 03/02/2016.  CBC Latest Ref Rng & Units 03/03/2016 03/02/2016 03/01/2016  WBC 3.6 - 11.0 K/uL 12.7(H) 1.6(L) 1.8(L)  Hemoglobin 12.0 - 16.0 g/dL 9.1(L) 9.0(L) 8.8(L)  Hematocrit 35.0 - 47.0 % 26.1(L) 26.0(L) 25.1(L)  Platelets 150 - 440 K/uL 102(L) 95(L) 107(L)   Lab Results  Component Value Date   CREATININE 1.01 (H) 03/03/2016   BUN 7 03/03/2016   NA 143 03/03/2016   K 3.6 03/03/2016   CL 106 03/03/2016   CO2 33 (H) 03/03/2016   MTX level from last night resulted as per pharmacy is less than 0.09- faxed report from [facility] this AM for level drawn on 10/20 has been reviewed and confirmed   Impression: DLBCL - high risk in remission after first line chemotherapy Completed third course of high dose MTX for CNS prophylaxis. Stable  kidney function, recovering peripheral blood counts She is stable for discharge She will return next week to the cancer center for count check and f/u with Dr. Rogue Bussing. She knows to call for any fever/bleeding or any new symptoms.   Consults: None  Significant Diagnostic Studies: labs: cbc cmp urine output  Treatments: IV MTX, IV Leucovorin, Apixiban, Neupogen   Discharge Exam:  She is AAOx3 HEENT- WNL Skin is normal Oral cavity is clear No distress Lungs clear, extremities no edema  Abdomen is soft non tender, No focal motor or sensory deficits subclavian port Is clean. Disposition: 01-Home or Self Care  Patient Instructions:    Medication List    STOP taking these medications   predniSONE 50 MG tablet Commonly known as:  DELTASONE   prochlorperazine 10 MG tablet Commonly known as:  COMPAZINE     TAKE these medications   acyclovir 400 MG tablet Commonly known as:  ZOVIRAX Take 1 tablet (400 mg total) by mouth 2 (two) times daily.   apixaban 5 MG Tabs tablet Commonly known as:  ELIQUIS Take 1 tablet (5 mg total) by mouth 2 (two) times daily. From 10/27/15   senna-docusate 8.6-50 MG tablet Commonly known as:  Senokot-S Take 1 tablet by mouth at bedtime as needed for mild constipation.      Activity: activity as tolerated Diet: regular diet Wound Care: none needed  Follow-up with Dr. Rogue Bussing in 4 days.  Signed: Creola Corn 03/03/2016 12:45 PM

## 2016-03-03 NOTE — Final Progress Note (Signed)
Stacy Murillo is doing well, ambulating freely Reports no diarrhea, nausea or vomiting or mouth sores No fever/cough, no skin rashes Appetite is good No cough or SOB Vitals are stable CBC shows  CBC Latest Ref Rng & Units 03/03/2016 03/02/2016 03/01/2016  WBC 3.6 - 11.0 K/uL 12.7(H) 1.6(L) 1.8(L)  Hemoglobin 12.0 - 16.0 g/dL 9.1(L) 9.0(L) 8.8(L)  Hematocrit 35.0 - 47.0 % 26.1(L) 26.0(L) 25.1(L)  Platelets 150 - 440 K/uL 102(L) 95(L) 107(L)   Lab Results  Component Value Date   CREATININE 1.01 (H) 03/03/2016   BUN 7 03/03/2016   NA 143 03/03/2016   K 3.6 03/03/2016   CL 106 03/03/2016   CO2 33 (H) 03/03/2016   MTX level from last night resulted as per pharmacy is less than 0.09- faxed report from [facility] this AM for level drawn on 10/20 has been reviewed and confirmed   On exam: She is AAOx3 HEENT- WNL Skin is normal Oral cavity is clear No distress Lungs clear, extremities no edema  Abdomen is soft non tender, No focal motor or sensory deficits subclavian port Is clean.   Impression: DLBCL - high risk in remission after first line chemotherapy Completed third course of high dose MTX for CNS prophylaxis. Stable kidney function, recovering peripheral blood counts She is stable for discharge She will return next week to the cancer center for count check and f/u with Dr. Rogue Bussing. She knows to call for any fever/bleeding or any new symptoms.

## 2016-03-03 NOTE — Final Progress Note (Signed)
Stacy Murillo is doing well, ambulating freely Reports no diarrhea, nausea or vomiting or mouth sores No fever/cough, no skin rashes Appetite is good No cough or SOB Vitals are stable CBC shows  CBC Latest Ref Rng & Units 03/03/2016 03/02/2016 03/01/2016  WBC 3.6 - 11.0 K/uL 12.7(H) 1.6(L) 1.8(L)  Hemoglobin 12.0 - 16.0 g/dL 9.1(L) 9.0(L) 8.8(L)  Hematocrit 35.0 - 47.0 % 26.1(L) 26.0(L) 25.1(L)  Platelets 150 - 440 K/uL 102(L) 95(L) 107(L)   Lab Results  Component Value Date   CREATININE 1.01 (H) 03/03/2016   BUN 7 03/03/2016   NA 143 03/03/2016   K 3.6 03/03/2016   CL 106 03/03/2016   CO2 33 (H) 03/03/2016   MTX level from last night resulted as per pharmacy is less than 0.09- faxed report from [facility] this AM for level drawn on 10/20 has been reviewed and confirmed   On exam: She is AAOx3 HEENT- WNL Skin is normal Oral cavity is clear No distress Lungs clear, extremities no edema  Abdomen is soft non tender, No focal motor or sensory deficits subclavian port Is clean.   Impression: DLBCL - high risk in remission after first line chemotherapy Completed third course of high dose MTX for CNS prophylaxis. Stable kidney function, recovering peripheral blood counts She is stable for discharge She will return next week to the cancer center for count check and f/u with Dr. Rogue Bussing. She knows to call for any fever/bleeding or any new symptoms.

## 2016-03-03 NOTE — Progress Notes (Signed)
MEDICATION RELATED CONSULT NOTE - INITIAL   Pharmacy Consult for  High- Dose Methotrexate monitoring Indication: Monitoring of methotrexate levels and ordering leucovorin doses.   No Known Allergies  Patient Measurements: Height: 5\' 2"  (157.5 cm) Weight: 154 lb 9.6 oz (70.1 kg) IBW/kg (Calculated) : 50.1 Adjusted Body Weight:    Vital Signs: Temp: 98.1 F (36.7 C) (10/21 0435) Temp Source: Oral (10/21 0435) BP: 148/56 (10/21 0435) Pulse Rate: 63 (10/21 0435) Intake/Output from previous day: 10/20 0701 - 10/21 0700 In: 2322.3 [I.V.:2322.3] Out: 5025 [Urine:5025] Intake/Output from this shift: Total I/O In: 1306.3 [I.V.:1306.3] Out: 2525 [Urine:2525]  Labs:  Recent Labs  03/01/16 0609 03/02/16 0548 03/03/16 0358  WBC 1.8* 1.6* 12.7*  HGB 8.8* 9.0* 9.1*  HCT 25.1* 26.0* 26.1*  PLT 107* 95* 102*  CREATININE 0.95 1.07* 1.01*   Estimated Creatinine Clearance: 53.6 mL/min (by C-G formula based on SCr of 1.01 mg/dL (H)).   Microbiology: No results found for this or any previous visit (from the past 720 hour(s)).  Medical History: Past Medical History:  Diagnosis Date  . Arthritis   . Blood dyscrasia   . Cancer ([place])   . Chest pain, unspecified   . Diverticulosis 07/06/15  . Dysrhythmia   . Esophagitis 07/06/15  . Fatigue   . Gastritis 07/06/15  . GERD (gastroesophageal reflux disease)   . 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 Leg cramps   . Lymphoma ([place]) 09/14/15   Monoclonal B cell lymphoma  . Night sweats   . Osteoporosis   . Overweight(278.02)    Obesity  . Palpitations   . Shortness of breath dyspnea   . Tachycardia   . Thrombocytopenia (HCC)     Medications:  Scheduled:  . apixaban  5 mg Oral BID  . famotidine  20 mg Oral BID  . leucovorin  10 mg/m2 Intravenous Q6H  . loratadine  10 mg Oral Daily  . potassium chloride  40 mEq Oral BID  . Tbo-Filgrastim  480 mcg Subcutaneous Daily   Infusions:  .  sodium bicarbonate infusion 1/4 NS 1000 mL 125  mL/hr at 03/03/16 0120    Assessment: 60 yo female to start on high dose methotrexate infusion with Leucovorin rescue, hx large B-cell Lymphoma.  Plan:  Per Dr. Rogue Bussing, do not start the methotrexate infusion until urine pH is at 7.0 and maintain> 7.0 during therapy; NaBicarb drip to continue at least 48h after MTX infusion. Per MD, check urine pH at least daily.   Sodium Bicarb Drip:  Goal Urine pH > 7.0 If urine pH less than goal consider increasing NaBicarb drip by 25 ml/hr, or if rate exceeds 175 ml/hr on the current NaBicarb drip, may need to increase bicarb in bag to 150 mEq. Checking urine pH every hour until pH >7.0 then once stabilizes consider checking every 2 hrs then every 4hrs etc . Care order instruction for RN to enter urine pH orders. Continue at least 48 hrs after Methotrexate infusion finished.  10/17 Urine pH has been stable at ~8.0 since increase in drip rate. Will continue with current rate and ask RN to move to q4h pH checks.  10/19 Urine pH has been stable at ~9.0. Will decrease IV rate to 146ml/hr and move urine pH checks to once per shift.  Methotrexate Levels:  Pt needs MTX level ordered at 24h after start of infusion (i.e. 20 h after the end of infusion b/c infusion runs over 4 hrs).  Based on MTX level, leucovorin dose will need  to be ordered and adjusted based on the daily MTX level.  Pt will need daily MTX levels and urine pH monitoring until MTX levels are <0.1 microM. (Note: Labs are sent to [facility] so takes 4-6 hrs to get result). Results are faxed? Can also find by going under "Chart Review" then "Media" and look for "Lab result scan".  10/17 MTX level ordered for 18:00 today, ~24 hours after start of infusion. Level = 3.6. Continue current leucovorin regimen. 10/18 f/u MTX level ordered for 18:00 today. Level = 0.28. Continue current leucovorin regimen. F/u level ordered for 18:00 today.  10/19 PM MTX level 0.13. Continue current leucovorin regimen. F/u  level ordered for 10/20 at 18:00  10/20 PM MTX level 0.09. Will d/c leucovorin per Uptodate nomogram. No further levels ordered.  Leucovorin: Follow Infomation for High-dose methotrexate-rescue- Leucovorin dosing (see [email or web address]).  10/17 will initiate Leucovorin rescue with 10mg /m2 IV q6h. Will adjust dose based on Methotrexate levels.  Paulina Fusi, PharmD, BCPS 03/03/2016 5:37 AM

## 2016-03-06 LAB — MISCELLANEOUS TEST

## 2016-03-08 ENCOUNTER — Inpatient Hospital Stay (HOSPITAL_BASED_OUTPATIENT_CLINIC_OR_DEPARTMENT_OTHER): Payer: BLUE CROSS/BLUE SHIELD | Admitting: Internal Medicine

## 2016-03-08 ENCOUNTER — Other Ambulatory Visit: Payer: Self-pay

## 2016-03-08 ENCOUNTER — Inpatient Hospital Stay: Payer: BLUE CROSS/BLUE SHIELD

## 2016-03-08 VITALS — BP 145/88 | HR 67 | Temp 96.7°F | Resp 18 | Wt 164.8 lb

## 2016-03-08 DIAGNOSIS — Z7901 Long term (current) use of anticoagulants: Secondary | ICD-10-CM

## 2016-03-08 DIAGNOSIS — E669 Obesity, unspecified: Secondary | ICD-10-CM | POA: Diagnosis not present

## 2016-03-08 DIAGNOSIS — C8583 Other specified types of non-Hodgkin lymphoma, intra-abdominal lymph nodes: Secondary | ICD-10-CM

## 2016-03-08 DIAGNOSIS — D649 Anemia, unspecified: Secondary | ICD-10-CM

## 2016-03-08 DIAGNOSIS — Z86718 Personal history of other venous thrombosis and embolism: Secondary | ICD-10-CM | POA: Diagnosis not present

## 2016-03-08 DIAGNOSIS — Z79899 Other long term (current) drug therapy: Secondary | ICD-10-CM | POA: Diagnosis not present

## 2016-03-08 DIAGNOSIS — C8299 Follicular lymphoma, unspecified, extranodal and solid organ sites: Secondary | ICD-10-CM

## 2016-03-08 DIAGNOSIS — K219 Gastro-esophageal reflux disease without esophagitis: Secondary | ICD-10-CM | POA: Diagnosis not present

## 2016-03-08 DIAGNOSIS — C8333 Diffuse large B-cell lymphoma, intra-abdominal lymph nodes: Secondary | ICD-10-CM | POA: Diagnosis not present

## 2016-03-08 DIAGNOSIS — M1990 Unspecified osteoarthritis, unspecified site: Secondary | ICD-10-CM | POA: Diagnosis not present

## 2016-03-08 LAB — CBC WITH DIFFERENTIAL/PLATELET
BASOS ABS: 0 10*3/uL (ref 0–0.1)
BASOS PCT: 1 %
EOS ABS: 0.1 10*3/uL (ref 0–0.7)
EOS PCT: 2 %
HCT: 33.7 % — ABNORMAL LOW (ref 35.0–47.0)
Hemoglobin: 11.5 g/dL — ABNORMAL LOW (ref 12.0–16.0)
LYMPHS PCT: 26 %
Lymphs Abs: 1.4 10*3/uL (ref 1.0–3.6)
MCH: 31.2 pg (ref 26.0–34.0)
MCHC: 34.3 g/dL (ref 32.0–36.0)
MCV: 91.1 fL (ref 80.0–100.0)
MONO ABS: 1 10*3/uL — AB (ref 0.2–0.9)
Monocytes Relative: 19 %
Neutro Abs: 2.7 10*3/uL (ref 1.4–6.5)
Neutrophils Relative %: 52 %
PLATELETS: 127 10*3/uL — AB (ref 150–440)
RBC: 3.7 MIL/uL — AB (ref 3.80–5.20)
RDW: 14.1 % (ref 11.5–14.5)
WBC: 5.2 10*3/uL (ref 3.6–11.0)

## 2016-03-08 LAB — COMPREHENSIVE METABOLIC PANEL
ALBUMIN: 4.4 g/dL (ref 3.5–5.0)
ALT: 37 U/L (ref 14–54)
AST: 33 U/L (ref 15–41)
Alkaline Phosphatase: 77 U/L (ref 38–126)
Anion gap: 9 (ref 5–15)
BUN: 11 mg/dL (ref 6–20)
CHLORIDE: 104 mmol/L (ref 101–111)
CO2: 27 mmol/L (ref 22–32)
CREATININE: 0.98 mg/dL (ref 0.44–1.00)
Calcium: 9.5 mg/dL (ref 8.9–10.3)
GFR calc Af Amer: >60 mL/min (ref >60)
GLUCOSE: 97 mg/dL (ref 65–99)
POTASSIUM: 4.8 mmol/L (ref 3.5–5.1)
SODIUM: 140 mmol/L (ref 135–145)
Total Bilirubin: 0.5 mg/dL (ref 0.3–1.2)
Total Protein: 6.5 g/dL (ref 6.5–8.1)

## 2016-03-08 NOTE — Progress Notes (Signed)
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 CT- 1-2 CM LN subpectoral/medistinal/ retro-peritoneal/pelvic/ Right inguinal LN 1.6cm- Bx- DLBCL- ABC; myc-POS; FISH gene re-arragement-NEG. PET- MULTIPLE LN/ Bone involvement; May 25th- R-CHOP q3 W x2; PET- Excellent Response.   # OCT 2017- PET scan- CR  # Lumbar puncture- difficult-spinal headache.   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 Last cycle was approximately 10 days ago.  Patient is feeling well- No chest pain or shortness of breath or cough.. Denies any nausea or vomiting. Denies any headaches No bleeding. No nausea no vomiting.  Denies any significant  headaches. Appetite is good. Denies any tingling and numbness. No lumps or bumps. She continues to have intermittent palpitations. Otherwise no syncopal episodes.  REVIEW OF SYSTEMS:  A complete 10 point review of system is done which is negative except mentioned above/history of present illness.   PAST MEDICAL HISTORY :  Past Medical History:  Diagnosis Date  . Arthritis   . Blood dyscrasia   . Cancer ([place])   . Chest pain, unspecified   . Diverticulosis 07/06/15  . Dysrhythmia   . Esophagitis 07/06/15  . Fatigue   . Gastritis 07/06/15  . GERD (gastroesophageal reflux disease)   . 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 Leg cramps   . Lymphoma ([place]) 09/14/15   Monoclonal B cell lymphoma  . Night sweats   . Osteoporosis   . Overweight(278.02)    Obesity  . Palpitations   . Shortness of breath dyspnea   . Tachycardia   . Thrombocytopenia ([place])     PAST SURGICAL HISTORY :   Past Surgical History:  Procedure Laterality Date  . ABDOMINAL HYSTERECTOMY  2005  . BONE MARROW BIOPSY  09/14/15  . CHOLECYSTECTOMY  1997  . COLONOSCOPY  07/05/2014  . ESOPHAGOGASTRODUODENOSCOPY  07/05/2014  . INGUINAL LYMPH NODE BIOPSY Right 10/05/2015   Procedure: INGUINAL LYMPH NODE BIOPSY;  Surgeon: Christene Lye, MD;  Location: [facility] ORS;  Service: General;  Laterality: Right;  . PERIPHERAL VASCULAR CATHETERIZATION N/A 09/27/2015   Procedure: Glori Luis Cath Insertion;  Surgeon: Algernon Huxley, MD;  Location: [place] [facility];  Service: Cardiovascular;  Laterality: N/A;  . PORTACATH PLACEMENT Right     FAMILY HISTORY :   Family History  Problem Relation Age of Onset  . Heart disease Father     CABG x 4  . Hypertension Mother   . Pancreatic cancer Mother   . Ulcers Mother   . Asthma Mother   . Diabetes Neg Hx     SOCIAL HISTORY:   Social History  Substance Use Topics  . Smoking status: Never Smoker  . Smokeless tobacco: Never Used  . Alcohol use No    ALLERGIES:  has No Known  Allergies.  MEDICATIONS:  Current Outpatient Prescriptions  Medication Sig Dispense Refill  . acyclovir (ZOVIRAX) 400 MG tablet Take 1 tablet (400 mg total) by mouth 2 (two) times daily. 60 tablet 3  . apixaban (ELIQUIS) 5 MG TABS tablet Take 1 tablet (5 mg total) by mouth 2 (two) times daily. From 10/27/15 60 tablet 3  . senna-docusate (SENOKOT-S) 8.6-50 MG tablet Take 1 tablet by mouth at bedtime as needed for mild constipation. 30 tablet 2   No current facility-administered medications for this visit.    Facility-Administered Medications Ordered in Other Visits  Medication Dose Route Frequency Provider Last Rate Last Dose  . 0.9 %  sodium chloride infusion   Intravenous Continuous Evlyn Kanner, NP   Stopped at 10/14/15 1312  . methotrexate (50 mg/ml) 6.3 g in sodium chloride 0.9 % 1,000 mL injection   Intravenous Once Cammie Sickle, MD        PHYSICAL EXAMINATION: ECOG PERFORMANCE STATUS: 1 - Symptomatic but completely ambulatory  BP (!) 145/88 (BP Location: Left Arm, Patient Position: Sitting)   Pulse 67   Temp (!) 96.7 F (35.9 C) (Tympanic)   Resp 18   Wt 164 lb 12.8 oz (74.8 kg)   SpO2 99%   BMI 30.14 kg/m   Filed Weights   03/08/16 1055  Weight: 164 lb 12.8 oz (74.8 kg)    GENERAL: Well-nourished well-developed; Alert, no distress and comfortable.   Accompanied by her husband.  EYES: no pallor or icterus OROPHARYNX: no thrush; good dentition  NECK: supple, no masses felt LYMPH:  no palpable lymphadenopathy in the cervical, axillary or inguinal regions LUNGS: clear to auscultation and  No wheeze or crackles HEART/CVS: regular rate & rhythm and no murmurs; No lower extremity edema. ABDOMEN:abdomen soft, non-tender and normal bowel sounds Musculoskeletal:no cyanosis of digits and no clubbing  PSYCH: alert & oriented x 3 with fluent speech NEURO: no focal motor/sensory deficits SKIN:  no rashes or significant lesions;  LABORATORY DATA:  I have reviewed  the data as listed    Component Value Date/Time   NA 140 03/08/2016 1008   NA 139 06/29/2010 0749   K 4.8 03/08/2016 1008   CL 104 03/08/2016 1008   CO2 27 03/08/2016 1008   GLUCOSE 97 03/08/2016 1008   BUN 11 03/08/2016 1008   BUN 16 06/29/2010 0749   CREATININE 0.98 03/08/2016 1008   CALCIUM 9.5 03/08/2016 1008   PROT 6.5 03/08/2016 1008   ALBUMIN 4.4 03/08/2016 1008   AST 33 03/08/2016 1008   ALT 37 03/08/2016 1008   ALKPHOS 77 03/08/2016 1008   BILITOT 0.5 03/08/2016 1008   GFRNONAA >60 03/08/2016 1008   GFRAA >60 03/08/2016 1008    No results found for: SPEP, UPEP  Lab Results  Component Value Date   WBC 5.2 03/08/2016   NEUTROABS 2.7 03/08/2016   HGB 11.5 (L) 03/08/2016   HCT 33.7 (L) 03/08/2016   MCV 91.1 03/08/2016   PLT 127 (L) 03/08/2016  Chemistry      Component Value Date/Time   NA 140 03/08/2016 1008   NA 139 06/29/2010 0749   K 4.8 03/08/2016 1008   CL 104 03/08/2016 1008   CO2 27 03/08/2016 1008   BUN 11 03/08/2016 1008   BUN 16 06/29/2010 0749   CREATININE 0.98 03/08/2016 1008   GLU 93 06/29/2010 0749      Component Value Date/Time   CALCIUM 9.5 03/08/2016 1008   ALKPHOS 77 03/08/2016 1008   AST 33 03/08/2016 1008   ALT 37 03/08/2016 1008   BILITOT 0.5 03/08/2016 1008       RADIOGRAPHIC STUDIES: I have personally reviewed the radiological images as listed and agreed with the findings in the report. No results found.   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 Currently, Status post 6 cycles of R CHOP- complete response on PET scan.  # Discussed re: role of trasnplant/ CART cell therapy if pt relapses. However, will recommend follow up with lymphoma service at [facility] for a second opinion given the aggressive nature of the lymphoma.  # Nov 6th follow up for chemo/high dose MXT # 4 last/cycle. # room 114- pt request./labs.    # Today labs within normal limits except for mild anemia hemoglobin 11.5 and platelet count of 127. Kidney numbers 11 was within normal limits.  No orders of the defined types were placed in this encounter.    Cammie Sickle, MD 03/08/2016 3:34 PM

## 2016-03-08 NOTE — Assessment & Plan Note (Addendum)
#   Diffuse large B-cell lymphoma ABC subtype/ leukemic cells noted in the blood. Currently, Status post 6 cycles of R CHOP- complete response on PET scan.  # Discussed re: role of trasnplant/ CART cell therapy if pt relapses. However, will recommend follow up with lymphoma service at [facility] for a second opinion given the aggressive nature of the lymphoma.  # Nov 6th follow up for chemo/high dose MXT # 4 last/cycle. # room 114- pt request./labs.   # Today labs within normal limits except for mild anemia hemoglobin 11.5 and platelet count of 127. Kidney numbers 11 was within normal limits.

## 2016-03-08 NOTE — Progress Notes (Signed)
Patient is here for follow up, rapid heart rate.

## 2016-03-19 ENCOUNTER — Inpatient Hospital Stay
Admission: AD | Admit: 2016-03-19 | Discharge: 2016-03-23 | DRG: 847 | Disposition: A | Discharge status: Home / Self Care | Payer: BLUE CROSS/BLUE SHIELD | Source: Ambulatory Visit | Attending: Internal Medicine | Admitting: Internal Medicine

## 2016-03-19 ENCOUNTER — Inpatient Hospital Stay: Payer: BLUE CROSS/BLUE SHIELD | Admitting: Internal Medicine

## 2016-03-19 ENCOUNTER — Encounter: Payer: Self-pay | Admitting: *Deleted

## 2016-03-19 ENCOUNTER — Inpatient Hospital Stay: Payer: BLUE CROSS/BLUE SHIELD | Attending: Internal Medicine

## 2016-03-19 VITALS — BP 119/78 | HR 77 | Temp 95.9°F | Resp 18 | Wt 164.8 lb

## 2016-03-19 DIAGNOSIS — D649 Anemia, unspecified: Secondary | ICD-10-CM | POA: Diagnosis not present

## 2016-03-19 DIAGNOSIS — E876 Hypokalemia: Secondary | ICD-10-CM | POA: Diagnosis not present

## 2016-03-19 DIAGNOSIS — E669 Obesity, unspecified: Secondary | ICD-10-CM | POA: Insufficient documentation

## 2016-03-19 DIAGNOSIS — C8333 Diffuse large B-cell lymphoma, intra-abdominal lymph nodes: Secondary | ICD-10-CM | POA: Diagnosis present

## 2016-03-19 DIAGNOSIS — Z79899 Other long term (current) drug therapy: Secondary | ICD-10-CM | POA: Diagnosis not present

## 2016-03-19 DIAGNOSIS — D61818 Other pancytopenia: Secondary | ICD-10-CM | POA: Diagnosis not present

## 2016-03-19 DIAGNOSIS — C8583 Other specified types of non-Hodgkin lymphoma, intra-abdominal lymph nodes: Secondary | ICD-10-CM

## 2016-03-19 DIAGNOSIS — D696 Thrombocytopenia, unspecified: Secondary | ICD-10-CM | POA: Diagnosis not present

## 2016-03-19 DIAGNOSIS — M1990 Unspecified osteoarthritis, unspecified site: Secondary | ICD-10-CM | POA: Insufficient documentation

## 2016-03-19 DIAGNOSIS — Z5111 Encounter for antineoplastic chemotherapy: Principal | ICD-10-CM

## 2016-03-19 DIAGNOSIS — K219 Gastro-esophageal reflux disease without esophagitis: Secondary | ICD-10-CM | POA: Insufficient documentation

## 2016-03-19 DIAGNOSIS — Z7901 Long term (current) use of anticoagulants: Secondary | ICD-10-CM | POA: Diagnosis not present

## 2016-03-19 DIAGNOSIS — Z86718 Personal history of other venous thrombosis and embolism: Secondary | ICD-10-CM | POA: Diagnosis not present

## 2016-03-19 DIAGNOSIS — C8330 Diffuse large B-cell lymphoma, unspecified site: Secondary | ICD-10-CM

## 2016-03-19 LAB — URINE PH
PH: 5 (ref 5.0–8.0)
PH: 6 (ref 5.0–8.0)
PH: 7 (ref 5.0–8.0)
PH: 7 (ref 5.0–8.0)
PH: 6 (ref 5.0–8.0)
PH: 6 (ref 5.0–8.0)
PH: 7 (ref 5.0–8.0)
PH: 7 (ref 5.0–8.0)
PH: 8 (ref 5.0–8.0)
pH: 7 (ref 5.0–8.0)
pH: 7 (ref 5.0–8.0)
pH: 7 (ref 5.0–8.0)
pH: 7 (ref 5.0–8.0)
pH: 7 (ref 5.0–8.0)

## 2016-03-19 LAB — COMPREHENSIVE METABOLIC PANEL
ALK PHOS: 64 U/L (ref 38–126)
ALT: 21 U/L (ref 14–54)
AST: 30 U/L (ref 15–41)
Albumin: 4.1 g/dL (ref 3.5–5.0)
Anion gap: 9 (ref 5–15)
BILIRUBIN TOTAL: 0.7 mg/dL (ref 0.3–1.2)
BUN: 14 mg/dL (ref 6–20)
CALCIUM: 9.3 mg/dL (ref 8.9–10.3)
CO2: 26 mmol/L (ref 22–32)
CREATININE: 0.89 mg/dL (ref 0.44–1.00)
Chloride: 106 mmol/L (ref 101–111)
GFR calc Af Amer: >60 mL/min (ref >60)
Glucose, Bld: 114 mg/dL — ABNORMAL HIGH (ref 65–99)
POTASSIUM: 3.7 mmol/L (ref 3.5–5.1)
Sodium: 141 mmol/L (ref 135–145)
TOTAL PROTEIN: 6.2 g/dL — AB (ref 6.5–8.1)

## 2016-03-19 LAB — CBC WITH DIFFERENTIAL/PLATELET
BASOS ABS: 0 10*3/uL (ref 0–0.1)
Basophils Relative: 2 %
Eosinophils Absolute: 0.1 10*3/uL (ref 0–0.7)
Eosinophils Relative: 3 %
HEMATOCRIT: 32.1 % — AB (ref 35.0–47.0)
HEMOGLOBIN: 11.3 g/dL — AB (ref 12.0–16.0)
LYMPHS PCT: 18 %
Lymphs Abs: 0.6 10*3/uL — ABNORMAL LOW (ref 1.0–3.6)
MCH: 31.3 pg (ref 26.0–34.0)
MCHC: 35.3 g/dL (ref 32.0–36.0)
MCV: 88.6 fL (ref 80.0–100.0)
MONO ABS: 0.4 10*3/uL (ref 0.2–0.9)
MONOS PCT: 12 %
NEUTROS ABS: 2 10*3/uL (ref 1.4–6.5)
Neutrophils Relative %: 65 %
Platelets: 182 10*3/uL (ref 150–440)
RBC: 3.63 MIL/uL — ABNORMAL LOW (ref 3.80–5.20)
RDW: 13.9 % (ref 11.5–14.5)
WBC: 3.1 10*3/uL — ABNORMAL LOW (ref 3.6–11.0)

## 2016-03-19 MED ORDER — ONDANSETRON HCL 4 MG/2ML IJ SOLN: 4.0000 mg | Freq: Three times a day (TID) | INTRAMUSCULAR | Status: DC | PRN

## 2016-03-19 MED ORDER — PROCHLORPERAZINE EDISYLATE 5 MG/ML IJ SOLN: 10.0000 mg | Freq: Four times a day (QID) | INTRAMUSCULAR | Status: DC | PRN

## 2016-03-19 MED ORDER — HYDROCORTISONE 2.5 % RE CREA: 1.0000 "application " | TOPICAL_CREAM | Freq: Two times a day (BID) | RECTAL | Status: DC | PRN

## 2016-03-19 MED ORDER — LEUCOVORIN CALCIUM INJECTION 100 MG
10.0000 mg/m2 | Freq: Four times a day (QID) | INTRAMUSCULAR | Status: DC
  Administered 2016-03-20 – 2016-03-23 (×13): 18 mg via INTRAVENOUS
  Filled 2016-03-19 (×17): qty 0.9

## 2016-03-19 MED ORDER — STERILE WATER FOR INJECTION IV SOLN
INTRAVENOUS | Status: DC
  Administered 2016-03-19 – 2016-03-23 (×13): via INTRAVENOUS
  Filled 2016-03-19 (×20): qty 9.71

## 2016-03-19 MED ORDER — STERILE WATER FOR INJECTION IV SOLN
INTRAVENOUS | Status: DC
  Administered 2016-03-19 (×2): via INTRAVENOUS
  Filled 2016-03-19 (×4): qty 9.71

## 2016-03-19 MED ORDER — GUAIFENESIN-DM 100-10 MG/5ML PO SYRP: 10.0000 mL | ORAL_SOLUTION | ORAL | Status: DC | PRN

## 2016-03-19 MED ORDER — SENNOSIDES-DOCUSATE SODIUM 8.6-50 MG PO TABS
1.0000 | ORAL_TABLET | Freq: Every evening | ORAL | Status: DC | PRN
  Administered 2016-03-19 – 2016-03-21 (×3): 1 via ORAL
  Filled 2016-03-19 (×3): qty 1

## 2016-03-19 MED ORDER — SODIUM CHLORIDE 0.9 % IV SOLN
INTRAVENOUS | Status: DC
  Administered 2016-03-19: 13:00:00 via INTRAVENOUS

## 2016-03-19 MED ORDER — ALUM & MAG HYDROXIDE-SIMETH 200-200-20 MG/5ML PO SUSP: 60.0000 mL | ORAL | Status: DC | PRN

## 2016-03-19 MED ORDER — PROCHLORPERAZINE MALEATE 10 MG PO TABS: 10.0000 mg | ORAL_TABLET | Freq: Four times a day (QID) | ORAL | Status: DC | PRN

## 2016-03-19 MED ORDER — ACYCLOVIR 200 MG PO CAPS
400.0000 mg | ORAL_CAPSULE | Freq: Two times a day (BID) | ORAL | Status: DC
  Administered 2016-03-19 – 2016-03-23 (×8): 400 mg via ORAL
  Filled 2016-03-19 (×8): qty 2

## 2016-03-19 MED ORDER — STERILE WATER FOR INJECTION IV SOLN
INTRAVENOUS | Status: DC
  Administered 2016-03-19: 10:00:00 via INTRAVENOUS
  Filled 2016-03-19 (×4): qty 9.71

## 2016-03-19 MED ORDER — ACETAMINOPHEN 325 MG PO TABS
650.0000 mg | ORAL_TABLET | ORAL | Status: DC | PRN
Start: 2016-03-19 — End: 2016-03-19

## 2016-03-19 MED ORDER — FAMOTIDINE 20 MG PO TABS
20.0000 mg | ORAL_TABLET | Freq: Two times a day (BID) | ORAL | Status: DC
Start: 2016-03-19 — End: 2016-03-23
  Administered 2016-03-19 – 2016-03-23 (×8): 20 mg via ORAL
  Filled 2016-03-19 (×8): qty 1

## 2016-03-19 MED ORDER — ACYCLOVIR 400 MG PO TABS
400.0000 mg | ORAL_TABLET | Freq: Two times a day (BID) | ORAL | Status: DC
  Filled 2016-03-19: qty 1

## 2016-03-19 MED ORDER — APIXABAN 5 MG PO TABS
5.0000 mg | ORAL_TABLET | Freq: Two times a day (BID) | ORAL | Status: DC
  Administered 2016-03-19 – 2016-03-23 (×8): 5 mg via ORAL
  Filled 2016-03-19 (×8): qty 1

## 2016-03-19 MED ORDER — ONDANSETRON HCL 4 MG PO TABS: 4.0000 mg | ORAL_TABLET | Freq: Three times a day (TID) | ORAL | Status: DC | PRN

## 2016-03-19 MED ORDER — SENNOSIDES-DOCUSATE SODIUM 8.6-50 MG PO TABS: 1.0000 | ORAL_TABLET | Freq: Every evening | ORAL | Status: DC | PRN

## 2016-03-19 MED ORDER — ONDANSETRON HCL 4 MG/2ML IJ SOLN
Freq: Once | INTRAMUSCULAR | Status: AC
  Administered 2016-03-19: 13:00:00 16 mg via INTRAVENOUS
  Filled 2016-03-19: qty 8

## 2016-03-19 MED ORDER — ONDANSETRON 8 MG PO TBDP: 4.0000 mg | ORAL_TABLET | Freq: Three times a day (TID) | ORAL | Status: DC | PRN

## 2016-03-19 MED ORDER — SODIUM CHLORIDE 0.9 % IV SOLN
6.3000 g | Freq: Once | INTRAVENOUS | Status: AC
  Administered 2016-03-19: 6.3 g via INTRAVENOUS
  Filled 2016-03-19: qty 252

## 2016-03-19 MED ORDER — SODIUM CHLORIDE 0.9 % IV SOLN: 8.0000 mg | Freq: Three times a day (TID) | INTRAVENOUS | Status: DC | PRN

## 2016-03-19 NOTE — H&P (Signed)
[name]  Patient Care Team: Jackolyn Confer, MD as PCP - General (Internal Medicine) Cammie Sickle, MD as Consulting Physician (Internal Medicine) Seeplaputhur Robinette Haines, MD (General Surgery) [name], MD as Consulting Physician (Cardiology)  CHIEF COMPLAINTS/PURPOSE OF CONSULTATION:  DLBCL/High dose MXT  HISTORY OF PRESENTING ILLNESS:  Stacy Murillo 60 y.o.  female  with history of stage IV diffuse large B-cell lymphoma/high risk- is currently status post 6 cycles of R CHOP chemotherapy with CR is here to proceed with cycle #4 of methotrexate high-dose in the hospital.  Patient denies any unusual aches and pains. Appetite is good. No weight loss. No nausea no vomiting. No headaches. No fevers or chills. Denies any tingling or numbness.  ROS: A complete 10 point review of system is done which is negative except mentioned above in history of present illness  MEDICAL HISTORY:  Past Medical History:  Diagnosis Date  . Arthritis   . Blood dyscrasia   . Cancer ([place])   . Chest pain, unspecified   . Diverticulosis 07/06/15  . Dysrhythmia   . Esophagitis 07/06/15  . Fatigue   . Gastritis 07/06/15  . GERD (gastroesophageal reflux disease)   . 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 Leg cramps   . Lymphoma ([place]) 09/14/15   Monoclonal B cell lymphoma  . Night sweats   . Osteoporosis   . Overweight(278.02)    Obesity  . Palpitations   . Shortness of breath dyspnea   . Tachycardia   . Thrombocytopenia ([place])     SURGICAL HISTORY: Past Surgical History:  Procedure Laterality Date  . ABDOMINAL HYSTERECTOMY  2005  . BONE MARROW BIOPSY  09/14/15  . CHOLECYSTECTOMY  1997  . COLONOSCOPY  07/05/2014  . ESOPHAGOGASTRODUODENOSCOPY  07/05/2014  . INGUINAL LYMPH NODE BIOPSY Right 10/05/2015   Procedure: INGUINAL LYMPH NODE BIOPSY;  Surgeon: Christene Lye, MD;  Location: [facility] ORS;  Service: General;  Laterality: Right;  . PERIPHERAL VASCULAR  CATHETERIZATION N/A 09/27/2015   Procedure: Glori Luis Cath Insertion;  Surgeon: Algernon Huxley, MD;  Location: [place] [facility];  Service: Cardiovascular;  Laterality: N/A;  . PORTACATH PLACEMENT Right     SOCIAL HISTORY: Social History   Social History  . Marital status: Married    Spouse name: N/A  . Number of children: N/A  . Years of education: N/A   Occupational History  . Not on file.   Social History Main Topics  . Smoking status: Never Smoker  . Smokeless tobacco: Never Used  . Alcohol use No  . Drug use: No  . Sexual activity: Yes    Birth control/ protection: None   Other Topics Concern  . Not on file   Social History Narrative   Married   Does not get regular exercise    FAMILY HISTORY: Family History  Problem Relation Age of Onset  . Heart disease Father     CABG x 4  . Hypertension Mother   . Pancreatic cancer Mother   . Ulcers Mother   . Asthma Mother   . Diabetes Neg Hx     ALLERGIES:  has No Known Allergies.  MEDICATIONS:  Current Outpatient Prescriptions  Medication Sig Dispense Refill  . acyclovir (ZOVIRAX) 400 MG tablet Take 1 tablet (400 mg total) by mouth 2 (two) times daily. 60 tablet 3  . apixaban (ELIQUIS) 5 MG TABS tablet Take 1 tablet (5 mg total) by mouth 2 (two) times daily. From 10/27/15 60 tablet  3  . ranitidine (ZANTAC) 150 MG tablet Take 150 mg by mouth 2 (two) times daily.    Marland Kitchen senna-docusate (SENOKOT-S) 8.6-50 MG tablet Take 1 tablet by mouth at bedtime as needed for mild constipation. 30 tablet 2   No current facility-administered medications for this visit.    Facility-Administered Medications Ordered in Other Visits  Medication Dose Route Frequency Provider Last Rate Last Dose  . 0.9 %  sodium chloride infusion   Intravenous Continuous Evlyn Kanner, NP   Stopped at 10/14/15 1312  . methotrexate (50 mg/ml) 6.3 g in sodium chloride 0.9 % 1,000 mL injection   Intravenous Once Cammie Sickle, MD           .  PHYSICAL EXAMINATION:  Vitals:   03/19/16 0848  BP: 119/78  Pulse: 77  Resp: 18  Temp: (!) 95.9 F (35.5 C)   Filed Weights   03/19/16 0848  Weight: 164 lb 12.8 oz (74.8 kg)    GENERAL: Well-nourished well-developed; Alert, no distress and comfortable.  With her husband. EYES: no pallor or icterus OROPHARYNX: no thrush or ulceration. NECK: supple, no masses felt LYMPH:  no palpable lymphadenopathy in the cervical, axillary or inguinal regions LUNGS: decreased breath sounds to auscultation at bases and  No wheeze or crackles HEART/CVS: regular rate & rhythm and no murmurs; No lower extremity edema ABDOMEN: abdomen soft, non-tender and normal bowel sounds Musculoskeletal:no cyanosis of digits and no clubbing  PSYCH: alert & oriented x 3 with fluent speech NEURO: no focal motor/sensory deficits SKIN:  no rashes or significant lesions  LABORATORY DATA:  I have reviewed the data as listed Lab Results  Component Value Date   WBC 3.1 (L) 03/19/2016   HGB 11.3 (L) 03/19/2016   HCT 32.1 (L) 03/19/2016   MCV 88.6 03/19/2016   PLT 182 03/19/2016    Recent Labs  10/20/15 0237  11/07/15 1440  02/03/16 0958 02/27/16 0935  03/03/16 0358 03/08/16 1008 03/19/16 0829  NA  --   < > 140  < >  --  141  < > 143 140 141  K  --   < > 3.8  < >  --  3.9  < > 3.6 4.8 3.7  CL  --   < > 104  < >  --  108  < > 106 104 106  CO2  --   < > 30  < >  --  25  < > 33* 27 26  GLUCOSE  --   < > 108*  < >  --  96  < > 89 97 114*  BUN  --   < > 11  < >  --  11  < > 7 11 14   CREATININE  --   < > 0.68  < >  --  0.83  < > 1.01* 0.98 0.89  CALCIUM  --   < > 8.9  < >  --  9.3  < > 8.6* 9.5 9.3  GFRNONAA  --   < > >60  < >  --  >60  < > 59* >60 >60  GFRAA  --   < > >60  < >  --  >60  < > >60 >60 >60  PROT 5.5*  < > 7.6  < > 6.4* 6.3*  --   --  6.5 6.2*  ALBUMIN 3.2*  < > 3.9  < > 4.1 4.1  --   --  4.4 4.1  AST 61*  < > 20  < > 26 22  --   --  33 30  ALT 183*  < > 19  < > 39 19  --   --  37 21   ALKPHOS 199*  < > 109  < > 64 54  --   --  77 64  BILITOT 0.3  < > 0.4  < > 0.9 0.8  --   --  0.5 0.7  BILIDIR <0.1*  --  <0.1*  --  <0.1*  --   --   --   --   --   IBILI NOT CALCULATED  --  NOT CALCULATED  --  NOT CALCULATED  --   --   --   --   --   < > = values in this interval not displayed.  RADIOGRAPHIC STUDIES: I have personally reviewed the radiological images as listed and agreed with the findings in the report. Nm Pet Image Restag (ps) Skull Base To Thigh  Result Date: 02/20/2016 CLINICAL DATA:  Subsequent treatment strategy for large cell lymphoma of intra abdominal lymph nodes. Status post right inguinal node biopsy 10/05/2015. Chemotherapy 3 weeks ago. EXAM: NUCLEAR MEDICINE PET SKULL BASE TO THIGH TECHNIQUE: 13.2 mCi F-18 FDG was injected intravenously. Full-ring PET imaging was performed from the skull base to thigh after the radiotracer. CT data was obtained and used for attenuation correction and anatomic localization. FASTING BLOOD GLUCOSE:  Value: 91 mg/dl COMPARISON:  11/11/2015 FINDINGS: NECK No areas of abnormal hypermetabolism. CHEST No thoracic nodal or pulmonary parenchymal hypermetabolism. ABDOMEN/PELVIS No abdominal pelvic nodal hypermetabolism. No splenic hypermetabolism. SKELETON Redemonstration of diffuse low-level marrow hypermetabolism. CT IMAGES PERFORMED FOR ATTENUATION CORRECTION No cervical adenopathy. A right-sided Port-A-Cath which terminates at the low SVC. Heart size upper normal. Tiny hiatal hernia. Cholecystectomy. Abdominal aortic atherosclerosis. No adenopathy within the abdomen or pelvis. Hysterectomy. IMPRESSION: 1. No findings to suggest residual or recurrent lymphoma. 2. Low-level marrow hypermetabolism, similar. This is likely related to stimulation by chemotherapy. 3. Aortic atherosclerosis. Electronically Signed   By: Abigail Miyamoto M.D.   On: 02/20/2016 13:47    ASSESSMENT & PLAN:   # 60 year old female patient with a history of diffuse large B-cell  lymphoma/involving peripheral blood status post 6 cycles of R CHOP currently admitted to the hospital for high-dose methotrexate cycle #4   # Intracranial prophylaxis- with high-dose methotrexate labs within normal limits. Proceed with cycle #4 and monitor methotrexate levels closely after chemotherapy. And adjust leucovorin based on Mxt levels.  # Diffuse large B-cell lymphoma status post 6 cycles of R CHOP- PET scan shows complete response.  # DVT prophylaxis- on Eliquis for hx of DVT.    # I reviewed the blood work- with the patient in detail;  # I anticipate the patient to stay at least 5 days.   All questions were answered. The patient knows to call the clinic with any problems, questions or concerns.    Cammie Sickle, MD 03/19/2016 9:12 AM

## 2016-03-19 NOTE — Progress Notes (Signed)
MEDICATION RELATED CONSULT NOTE - INITIAL   Pharmacy Consult for  High- Dose Methotrexate monitoring Indication: Monitoring of methotrexate levels and ordering leucovorin doses.   No Known Allergies  Patient Measurements: Height: 5' 2.5" (158.8 cm) Weight: 167 lb 6.4 oz (75.9 kg) IBW/kg (Calculated) : 51.25 Adjusted Body Weight:    Vital Signs: Temp: 97.9 F (36.6 C) (11/06 1424) Temp Source: Oral (11/06 1424) BP: 125/69 (11/06 1424) Pulse Rate: 72 (11/06 1424) Intake/Output from previous day: No intake/output data recorded. Intake/Output from this shift: Total I/O In: 1643.7 [I.V.:905.7; IV Piggyback:738] Out: 1925 [id number]  Labs:  Recent Labs  03/19/16 0829  WBC 3.1*  HGB 11.3*  HCT 32.1*  PLT 182  CREATININE 0.89  ALBUMIN 4.1  PROT 6.2*  AST 30  ALT 21  ALKPHOS 64  BILITOT 0.7   Estimated Creatinine Clearance: 64 mL/min (by C-G formula based on SCr of 0.89 mg/dL).   Microbiology: No results found for this or any previous visit (from the past 720 hour(s)).  Medical History: Past Medical History:  Diagnosis Date  . Arthritis   . Blood dyscrasia   . Cancer ([place])   . Chest pain, unspecified   . Diverticulosis 07/06/15  . Dysrhythmia   . Esophagitis 07/06/15  . Fatigue   . Gastritis 07/06/15  . GERD (gastroesophageal reflux disease)   . 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 Leg cramps   . Lymphoma ([place]) 09/14/15   Monoclonal B cell lymphoma  . Night sweats   . Osteoporosis   . Overweight(278.02)    Obesity  . Palpitations   . Shortness of breath dyspnea   . Tachycardia   . Thrombocytopenia (HCC)     Medications:  Scheduled:  . acyclovir  400 mg Oral BID  . apixaban  5 mg Oral BID  . famotidine  20 mg Oral BID  . [START ON 03/20/2016] leucovorin  10 mg/m2 Intravenous Q6H  . methotrexate CHEMO IV infusion intermediate-high dose  6.3 g Intravenous Once   Infusions:  . sodium chloride 20 mL/hr at 03/19/16 1323  .  sodium bicarbonate infusion  1/4 NS 1000 mL 150 mL/hr at 03/19/16 1709    Assessment: 60 yo female to start on high dose methotrexate infusion with Leucovorin rescue, hx large B-cell Lymphoma.  Baseline urine pH = 5.0 11/6 at 0937.  Plan:  Do not start the methotrexate infusion until urine pH is at 7.0 and maintain > 7.0 during therapy; NaBicarb drip to continue at least 48h after MTX infusion. Per MD, check urine pH at least daily.   Sodium Bicarb Drip:  Goal Urine pH > 7.0 If urine pH less than goal consider increasing NaBicarb drip by 25 ml/hr, or if rate exceeds 175 ml/hr on the current NaBicarb drip, may need to increase bicarb in bag to 150 mEq. Checking urine pH every hour until pH >7.0 then once stabilizes and MTX infusion is complete consider checking every 2 hrs then every 4hrs etc . Care order instruction for RN to enter urine pH orders. Continue at least 48 hrs after Methotrexate infusion finished.  Methotrexate Levels:  Pt needs MTX level ordered at 24h after start of infusion (i.e. 20 h after the end of infusion b/c infusion runs over 4 hrs).  Based on MTX level, leucovorin dose will need to be ordered and adjusted based on the daily MTX level.  Pt will need daily MTX levels and urine pH monitoring until MTX levels are <0.1 microM. (Note: Labs are sent to [facility]  so takes 4-6 hrs to get result. Can find result by going under "Chart Review" then "Media" and look for "Lab result scan").  Leucovorin: Follow Infomation for High-dose methotrexate-rescue- Leucovorin dosing (see [email or web address]). Leucovorin to start at 24 h after start of methotrexate infusion (leucovorin 10 mg/m2 IV q6h).   Home meds: Spoke to Dr. Rogue Bussing over the phone regarding restarting home meds. Ok to restart all four home meds; ranitidine/pepcid ok to restart as it is an H2 blocker and acyclovir and MTX are ok together per MD renal function wise.   1106 :  Urine pH @ 16:11 = 6.0.   Will increase NaBicarb drip to 150 ml/hr and recheck Urine  pH 2 hrs after rate change.   Pharmacy will continue to follow.   Pinchos Topel D Clinical Pharmacist 03/19/2016 5:14 PM

## 2016-03-19 NOTE — Progress Notes (Signed)
Pt to be admitted to room 114 for high dose methotrexate. Pt ambulatory. Report given to Tamala Fothergill, RN on 1C.

## 2016-03-19 NOTE — Progress Notes (Addendum)
MEDICATION RELATED CONSULT NOTE - INITIAL   Pharmacy Consult for  High- Dose Methotrexate monitoring Indication: Monitoring of methotrexate levels and ordering leucovorin doses.   No Known Allergies  Patient Measurements: Height: 5' 2.5" (158.8 cm) Weight: 167 lb 6.4 oz (75.9 kg) IBW/kg (Calculated) : 51.25 Adjusted Body Weight:    Vital Signs: Temp: 98.5 F (36.9 C) (11/06 2028) Temp Source: Oral (11/06 2028) BP: 139/68 (11/06 2028) Pulse Rate: 62 (11/06 2028) Intake/Output from previous day: No intake/output data recorded. Intake/Output from this shift: Total I/O In: -  Out: 400 [Urine:400]  Labs:  Recent Labs  03/19/16 0829  WBC 3.1*  HGB 11.3*  HCT 32.1*  PLT 182  CREATININE 0.89  ALBUMIN 4.1  PROT 6.2*  AST 30  ALT 21  ALKPHOS 64  BILITOT 0.7   Estimated Creatinine Clearance: 64 mL/min (by C-G formula based on SCr of 0.89 mg/dL).   Microbiology: No results found for this or any previous visit (from the past 720 hour(s)).  Medical History: Past Medical History:  Diagnosis Date  . Arthritis   . Blood dyscrasia   . Cancer ([place])   . Chest pain, unspecified   . Diverticulosis 07/06/15  . Dysrhythmia   . Esophagitis 07/06/15  . Fatigue   . Gastritis 07/06/15  . GERD (gastroesophageal reflux disease)   . 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 Leg cramps   . Lymphoma ([place]) 09/14/15   Monoclonal B cell lymphoma  . Night sweats   . Osteoporosis   . Overweight(278.02)    Obesity  . Palpitations   . Shortness of breath dyspnea   . Tachycardia   . Thrombocytopenia (HCC)     Medications:  Scheduled:  . acyclovir  400 mg Oral BID  . apixaban  5 mg Oral BID  . famotidine  20 mg Oral BID  . [START ON 03/20/2016] leucovorin  10 mg/m2 Intravenous Q6H   Infusions:  . sodium chloride 20 mL/hr at 03/19/16 1323  .  sodium bicarbonate infusion 1/4 NS 1000 mL      Assessment: 60 yo female to start on high dose methotrexate infusion with Leucovorin rescue, hx  large B-cell Lymphoma.  Baseline urine pH = 5.0 11/6 at 0937.  Plan:  Do not start the methotrexate infusion until urine pH is at 7.0 and maintain > 7.0 during therapy; NaBicarb drip to continue at least 48h after MTX infusion. Per MD, check urine pH at least daily.   Sodium Bicarb Drip:  Goal Urine pH > 7.0 If urine pH less than goal consider increasing NaBicarb drip by 25 ml/hr, or if rate exceeds 175 ml/hr on the current NaBicarb drip, may need to increase bicarb in bag to 150 mEq. Checking urine pH every hour until pH >7.0 then once stabilizes and MTX infusion is complete consider checking every 2 hrs then every 4hrs etc . Care order instruction for RN to enter urine pH orders. Continue at least 48 hrs after Methotrexate infusion finished.  Methotrexate Levels:  Pt needs MTX level ordered at 24h after start of infusion (i.e. 20 h after the end of infusion b/c infusion runs over 4 hrs).  Based on MTX level, leucovorin dose will need to be ordered and adjusted based on the daily MTX level.  Pt will need daily MTX levels and urine pH monitoring until MTX levels are <0.1 microM. (Note: Labs are sent to [facility] so takes 4-6 hrs to get result. Can find result by going under "Chart Review" then "Media" and  look for "Lab result scan").  Leucovorin: Follow Infomation for High-dose methotrexate-rescue- Leucovorin dosing (see [email or web address]). Leucovorin to start at 24 h after start of methotrexate infusion (leucovorin 10 mg/m2 IV q6h).   Home meds: Spoke to Dr. Rogue Bussing over the phone regarding restarting home meds. Ok to restart all four home meds; ranitidine/pepcid ok to restart as it is an H2 blocker and acyclovir and MTX are ok together per MD renal function wise.   1106 :  Urine pH @ 16:11 = 6.0.   Will increase NaBicarb drip to 150 ml/hr and recheck Urine pH 2 hrs after rate change.   1106:   Urine pH @ 17:00, 18:00, 19:00, 20:00 = 7.0,  Will increase NaBicarb drip to 175 ml/hr and recheck urine  pH hourly for next 2 hrs.   Pharmacy will continue to follow.   Sarra Rachels D Clinical Pharmacist 03/19/2016 9:13 PM

## 2016-03-19 NOTE — Progress Notes (Signed)
Patient is here for follow up, she has no complaints  

## 2016-03-19 NOTE — Assessment & Plan Note (Signed)
#   Status post 6 cycles of R CHOP- complete response on PET scan.  # Admitted to the hospital for high-dose methotrexate #4 today.

## 2016-03-19 NOTE — Progress Notes (Addendum)
MEDICATION RELATED CONSULT NOTE - INITIAL   Pharmacy Consult for  High- Dose Methotrexate monitoring Indication: Monitoring of methotrexate levels and ordering leucovorin doses.   No Known Allergies  Patient Measurements:   Adjusted Body Weight:    Vital Signs: Temp: 95.9 F (35.5 C) (11/06 0848) Temp Source: Tympanic (11/06 0848) BP: 119/78 (11/06 0848) Pulse Rate: 77 (11/06 0848) Intake/Output from previous day: No intake/output data recorded. Intake/Output from this shift: Total I/O In: -  Out: 150 [Urine:150]  Labs:  Recent Labs  03/19/16 0829  WBC 3.1*  HGB 11.3*  HCT 32.1*  PLT 182  CREATININE 0.89  ALBUMIN 4.1  PROT 6.2*  AST 30  ALT 21  ALKPHOS 64  BILITOT 0.7   Estimated Creatinine Clearance: 62.9 mL/min (by C-G formula based on SCr of 0.89 mg/dL).   Microbiology: No results found for this or any previous visit (from the past 720 hour(s)).  Medical History: Past Medical History:  Diagnosis Date  . Arthritis   . Blood dyscrasia   . Cancer ([place])   . Chest pain, unspecified   . Diverticulosis 07/06/15  . Dysrhythmia   . Esophagitis 07/06/15  . Fatigue   . Gastritis 07/06/15  . GERD (gastroesophageal reflux disease)   . 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 Leg cramps   . 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 Night sweats   . Osteoporosis   . Overweight(278.02)    Obesity  . Palpitations   . Shortness of breath dyspnea   . Tachycardia   . Thrombocytopenia (HCC)     Medications:  Scheduled:  . methotrexate CHEMO IV infusion intermediate-high dose  6.3 g Intravenous Once  . ondansetron (ZOFRAN) with dexamethasone (DECADRON) IV   Intravenous Once   Infusions:  . sodium chloride    .  sodium bicarbonate infusion 1/4 NS 1000 mL      Assessment: 60 yo female to start on high dose methotrexate infusion with Leucovorin rescue, hx large B-cell Lymphoma.  Baseline urine pH = 5.0 11/6 at 0937.  Plan:  Do not start the  methotrexate infusion until urine pH is at 7.0 and maintain > 7.0 during therapy; NaBicarb drip to continue at least 48h after MTX infusion. Per MD, check urine pH at least daily.   Sodium Bicarb Drip:  Goal Urine pH > 7.0 If urine pH less than goal consider increasing NaBicarb drip by 25 ml/hr, or if rate exceeds 175 ml/hr on the current NaBicarb drip, may need to increase bicarb in bag to 150 mEq. Checking urine pH every hour until pH >7.0 then once stabilizes and MTX infusion is complete consider checking every 2 hrs then every 4hrs etc . Care order instruction for RN to enter urine pH orders. Continue at least 48 hrs after Methotrexate infusion finished.  Methotrexate Levels:  Pt needs MTX level ordered at 24h after start of infusion (i.e. 20 h after the end of infusion b/c infusion runs over 4 hrs).  Based on MTX level, leucovorin dose will need to be ordered and adjusted based on the daily MTX level.  Pt will need daily MTX levels and urine pH monitoring until MTX levels are <0.1 microM. (Note: Labs are sent to [facility] so takes 4-6 hrs to get result. Can find result by going under "Chart Review" then "Media" and look for "Lab result scan").  Leucovorin: Follow Infomation for High-dose methotrexate-rescue- Leucovorin dosing (see [email or web address]). Leucovorin to start at 24 h after start of methotrexate infusion (  leucovorin 10 mg/m2 IV q6h).   Home meds: Spoke to Dr. Rogue Bussing over the phone regarding restarting home meds. Ok to restart all four home meds; ranitidine/pepcid ok to restart as it is an H2 blocker and acyclovir and MTX are ok together per MD renal function wise.   Pharmacy will continue to follow.   Rayna Sexton, PharmD, BCPS Clinical Pharmacist 03/19/2016 10:18 AM

## 2016-03-19 NOTE — Progress Notes (Signed)
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 CT- 1-2 CM LN subpectoral/medistinal/ retro-peritoneal/pelvic/ Right inguinal LN 1.6cm- Bx- DLBCL- ABC; myc-POS; FISH gene re-arragement-NEG. PET- MULTIPLE LN/ Bone involvement; May 25th- R-CHOP q3 W x2; PET- Excellent Response.   # OCT 2017- PET scan- CR  # Lumbar puncture- difficult-spinal headache.   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 Last cycle was approximately 10 days ago.  Patient is feeling well- No chest pain or shortness of breath or cough.. Denies any nausea or vomiting. Denies any headaches No bleeding. No nausea no vomiting.  Denies any significant  headaches. Appetite is good. Denies any tingling and numbness. No lumps or bumps. She continues to have intermittent palpitations. Otherwise no syncopal episodes.  REVIEW OF SYSTEMS:  A complete 10 point review of system is done which is negative except mentioned above/history of present illness.   PAST MEDICAL HISTORY :  Past Medical History:  Diagnosis Date  . Arthritis   . Blood dyscrasia   . Cancer ([place])   . Chest pain, unspecified   . Diverticulosis 07/06/15  . Dysrhythmia   . Esophagitis 07/06/15  . Fatigue   . Gastritis 07/06/15  . GERD (gastroesophageal reflux disease)   . 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 Leg cramps   . Lymphoma ([place]) 09/14/15   Monoclonal B cell lymphoma  . Night sweats   . Osteoporosis   . Overweight(278.02)    Obesity  . Palpitations   . Shortness of breath dyspnea   . Tachycardia   . Thrombocytopenia ([place])     PAST SURGICAL HISTORY :   Past Surgical History:  Procedure Laterality Date  . ABDOMINAL HYSTERECTOMY  2005  . BONE MARROW BIOPSY  09/14/15  . CHOLECYSTECTOMY  1997  . COLONOSCOPY  07/05/2014  . ESOPHAGOGASTRODUODENOSCOPY  07/05/2014  . INGUINAL LYMPH NODE BIOPSY Right 10/05/2015   Procedure: INGUINAL LYMPH NODE BIOPSY;  Surgeon: Christene Lye, MD;  Location: [facility] ORS;  Service: General;  Laterality: Right;  . PERIPHERAL VASCULAR CATHETERIZATION N/A 09/27/2015   Procedure: Glori Luis Cath Insertion;  Surgeon: Algernon Huxley, MD;  Location: [place] CV LAB;  Service: Cardiovascular;  Laterality: N/A;  . PORTACATH PLACEMENT Right     FAMILY HISTORY :   Family History  Problem Relation Age of Onset  . Heart disease Father     CABG x 4  . Hypertension Mother   . Pancreatic cancer Mother   . Ulcers Mother   . Asthma Mother   . Diabetes Neg Hx     SOCIAL HISTORY:   Social History  Substance Use Topics  . Smoking status: Never Smoker  . Smokeless tobacco: Never Used  . Alcohol use No    ALLERGIES:  has No Known  Allergies.  MEDICATIONS:  Current Outpatient Prescriptions  Medication Sig Dispense Refill  . acyclovir (ZOVIRAX) 400 MG tablet Take 1 tablet (400 mg total) by mouth 2 (two) times daily. 60 tablet 3  . apixaban (ELIQUIS) 5 MG TABS tablet Take 1 tablet (5 mg total) by mouth 2 (two) times daily. From 10/27/15 60 tablet 3  . senna-docusate (SENOKOT-S) 8.6-50 MG tablet Take 1 tablet by mouth at bedtime as needed for mild constipation. 30 tablet 2   No current facility-administered medications for this visit.    Facility-Administered Medications Ordered in Other Visits  Medication Dose Route Frequency Provider Last Rate Last Dose  . 0.9 %  sodium chloride infusion   Intravenous Continuous Evlyn Kanner, NP   Stopped at 10/14/15 1312  . methotrexate (50 mg/ml) 6.3 g in sodium chloride 0.9 % 1,000 mL injection   Intravenous Once Cammie Sickle, MD        PHYSICAL EXAMINATION: ECOG PERFORMANCE STATUS: 1 - Symptomatic but completely ambulatory  There were no vitals taken for this visit.  There were no vitals filed for this visit.  GENERAL: Well-nourished well-developed; Alert, no distress and comfortable.   Accompanied by her husband.  EYES: no pallor or icterus OROPHARYNX: no thrush; good dentition  NECK: supple, no masses felt LYMPH:  no palpable lymphadenopathy in the cervical, axillary or inguinal regions LUNGS: clear to auscultation and  No wheeze or crackles HEART/CVS: regular rate & rhythm and no murmurs; No lower extremity edema. ABDOMEN:abdomen soft, non-tender and normal bowel sounds Musculoskeletal:no cyanosis of digits and no clubbing  PSYCH: alert & oriented x 3 with fluent speech NEURO: no focal motor/sensory deficits SKIN:  no rashes or significant lesions;  LABORATORY DATA:  I have reviewed the data as listed    Component Value Date/Time   NA 140 03/08/2016 1008   NA 139 06/29/2010 0749   K 4.8 03/08/2016 1008   CL 104 03/08/2016 1008   CO2 27 03/08/2016  1008   GLUCOSE 97 03/08/2016 1008   BUN 11 03/08/2016 1008   BUN 16 06/29/2010 0749   CREATININE 0.98 03/08/2016 1008   CALCIUM 9.5 03/08/2016 1008   PROT 6.5 03/08/2016 1008   ALBUMIN 4.4 03/08/2016 1008   AST 33 03/08/2016 1008   ALT 37 03/08/2016 1008   ALKPHOS 77 03/08/2016 1008   BILITOT 0.5 03/08/2016 1008   GFRNONAA >60 03/08/2016 1008   GFRAA >60 03/08/2016 1008    No results found for: SPEP, UPEP  Lab Results  Component Value Date   WBC 5.2 03/08/2016   NEUTROABS 2.7 03/08/2016   HGB 11.5 (L) 03/08/2016   HCT 33.7 (L) 03/08/2016   MCV 91.1 03/08/2016   PLT 127 (L) 03/08/2016      Chemistry      Component Value Date/Time   NA 140 03/08/2016 1008   NA 139 06/29/2010 0749   K 4.8 03/08/2016 1008   CL 104 03/08/2016 1008   CO2 27 03/08/2016 1008   BUN 11 03/08/2016 1008  BUN 16 06/29/2010 0749   CREATININE 0.98 03/08/2016 1008   GLU 93 06/29/2010 0749      Component Value Date/Time   CALCIUM 9.5 03/08/2016 1008   ALKPHOS 77 03/08/2016 1008   AST 33 03/08/2016 1008   ALT 37 03/08/2016 1008   BILITOT 0.5 03/08/2016 1008       RADIOGRAPHIC STUDIES: I have personally reviewed the radiological images as listed and agreed with the findings in the report. No results found.   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  # Admitted to the hospital for high-dose methotrexate #4 today.   No orders of the defined types were placed in this encounter.    Cammie Sickle, MD 03/19/2016 8:25 AM

## 2016-03-20 DIAGNOSIS — E669 Obesity, unspecified: Secondary | ICD-10-CM

## 2016-03-20 DIAGNOSIS — Z79899 Other long term (current) drug therapy: Secondary | ICD-10-CM

## 2016-03-20 DIAGNOSIS — Z7901 Long term (current) use of anticoagulants: Secondary | ICD-10-CM

## 2016-03-20 DIAGNOSIS — Z86718 Personal history of other venous thrombosis and embolism: Secondary | ICD-10-CM

## 2016-03-20 DIAGNOSIS — D649 Anemia, unspecified: Secondary | ICD-10-CM

## 2016-03-20 DIAGNOSIS — M1990 Unspecified osteoarthritis, unspecified site: Secondary | ICD-10-CM

## 2016-03-20 DIAGNOSIS — C8333 Diffuse large B-cell lymphoma, intra-abdominal lymph nodes: Secondary | ICD-10-CM

## 2016-03-20 DIAGNOSIS — K219 Gastro-esophageal reflux disease without esophagitis: Secondary | ICD-10-CM

## 2016-03-20 LAB — CBC WITH DIFFERENTIAL/PLATELET
BASOS ABS: 0 10*3/uL (ref 0–0.1)
BASOS PCT: 0 %
Eosinophils Absolute: 0 10*3/uL (ref 0–0.7)
Eosinophils Relative: 0 %
HEMATOCRIT: 29 % — AB (ref 35.0–47.0)
Hemoglobin: 10.2 g/dL — ABNORMAL LOW (ref 12.0–16.0)
Lymphocytes Relative: 6 %
Lymphs Abs: 0.3 10*3/uL — ABNORMAL LOW (ref 1.0–3.6)
MCH: 31.3 pg (ref 26.0–34.0)
MCHC: 35.3 g/dL (ref 32.0–36.0)
MCV: 88.7 fL (ref 80.0–100.0)
MONO ABS: 0.2 10*3/uL (ref 0.2–0.9)
Monocytes Relative: 3 %
NEUTROS ABS: 5.2 10*3/uL (ref 1.4–6.5)
Neutrophils Relative %: 91 %
PLATELETS: 142 10*3/uL — AB (ref 150–440)
RBC: 3.27 MIL/uL — ABNORMAL LOW (ref 3.80–5.20)
RDW: 13.4 % (ref 11.5–14.5)
WBC: 5.7 10*3/uL (ref 3.6–11.0)

## 2016-03-20 LAB — BASIC METABOLIC PANEL
ANION GAP: 7 (ref 5–15)
BUN: 19 mg/dL (ref 6–20)
CALCIUM: 8.8 mg/dL — AB (ref 8.9–10.3)
CO2: 31 mmol/L (ref 22–32)
Chloride: 103 mmol/L (ref 101–111)
Creatinine, Ser: 0.97 mg/dL (ref 0.44–1.00)
GLUCOSE: 117 mg/dL — AB (ref 65–99)
Potassium: 3.3 mmol/L — ABNORMAL LOW (ref 3.5–5.1)
SODIUM: 141 mmol/L (ref 135–145)

## 2016-03-20 LAB — URINE PH
PH: 9 — AB (ref 5.0–8.0)
PH: 9 — AB (ref 5.0–8.0)
PH: 8 (ref 5.0–8.0)
PH: 9 — AB (ref 5.0–8.0)
pH: 9 — ABNORMAL HIGH (ref 5.0–8.0)
pH: 9 — ABNORMAL HIGH (ref 5.0–8.0)
pH: 8 (ref 5.0–8.0)
pH: 9 — ABNORMAL HIGH (ref 5.0–8.0)

## 2016-03-20 MED ORDER — POTASSIUM CHLORIDE CRYS ER 20 MEQ PO TBCR
40.0000 meq | EXTENDED_RELEASE_TABLET | Freq: Every day | ORAL | Status: DC
  Administered 2016-03-20 – 2016-03-23 (×4): 40 meq via ORAL
  Filled 2016-03-20 (×4): qty 2

## 2016-03-20 MED ORDER — ACETAMINOPHEN 325 MG PO TABS
650.0000 mg | ORAL_TABLET | Freq: Four times a day (QID) | ORAL | Status: DC | PRN
  Administered 2016-03-20: 650 mg via ORAL
  Filled 2016-03-20: qty 2

## 2016-03-20 NOTE — Progress Notes (Addendum)
MEDICATION RELATED CONSULT NOTE - INITIAL   Pharmacy Consult for  High- Dose Methotrexate monitoring Indication: Monitoring of methotrexate levels and ordering leucovorin doses.   No Known Allergies  Patient Measurements: Height: 5' 2.5" (158.8 cm) Weight: 167 lb 6.4 oz (75.9 kg) IBW/kg (Calculated) : 51.25 Adjusted Body Weight:    Vital Signs: Temp: 98.5 F (36.9 C) (11/06 2028) Temp Source: Oral (11/06 2028) BP: 139/68 (11/06 2028) Pulse Rate: 62 (11/06 2028) Intake/Output from previous day: 11/06 0701 - 11/07 0700 In: 1643.7 [I.V.:905.7; IV Piggyback:738] Out: [id number] [Urine:3775] Intake/Output from this shift: Total I/O In: -  Out: 1250 [Urine:1250]  Labs:  Recent Labs  03/19/16 0829  WBC 3.1*  HGB 11.3*  HCT 32.1*  PLT 182  CREATININE 0.89  ALBUMIN 4.1  PROT 6.2*  AST 30  ALT 21  ALKPHOS 64  BILITOT 0.7   Estimated Creatinine Clearance: 64 mL/min (by C-G formula based on SCr of 0.89 mg/dL).   Microbiology: No results found for this or any previous visit (from the past 720 hour(s)).  Medical History: Past Medical History:  Diagnosis Date  . Arthritis   . Blood dyscrasia   . Cancer ([place])   . Chest pain, unspecified   . Diverticulosis 07/06/15  . Dysrhythmia   . Esophagitis 07/06/15  . Fatigue   . Gastritis 07/06/15  . GERD (gastroesophageal reflux disease)   . 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 Leg cramps   . Lymphoma ([place]) 09/14/15   Monoclonal B cell lymphoma  . Night sweats   . Osteoporosis   . Overweight(278.02)    Obesity  . Palpitations   . Shortness of breath dyspnea   . Tachycardia   . Thrombocytopenia (HCC)     Medications:  Scheduled:  . acyclovir  400 mg Oral BID  . apixaban  5 mg Oral BID  . famotidine  20 mg Oral BID  . leucovorin  10 mg/m2 Intravenous Q6H   Infusions:  . sodium chloride Stopped (03/19/16 2224)  .  sodium bicarbonate infusion 1/4 NS 1000 mL 175 mL/hr at 03/19/16 2130    Assessment: 60 yo female to  start on high dose methotrexate infusion with Leucovorin rescue, hx large B-cell Lymphoma.  Baseline urine pH = 5.0 11/6 at 0937.  Plan:  Do not start the methotrexate infusion until urine pH is at 7.0 and maintain > 7.0 during therapy; NaBicarb drip to continue at least 48h after MTX infusion. Per MD, check urine pH at least daily.   Sodium Bicarb Drip:  Goal Urine pH > 7.0 If urine pH less than goal consider increasing NaBicarb drip by 25 ml/hr, or if rate exceeds 175 ml/hr on the current NaBicarb drip, may need to increase bicarb in bag to 150 mEq. Checking urine pH every hour until pH >7.0 then once stabilizes and MTX infusion is complete consider checking every 2 hrs then every 4hrs etc . Care order instruction for RN to enter urine pH orders. Continue at least 48 hrs after Methotrexate infusion finished.  Methotrexate Levels:  Pt needs MTX level ordered at 24h after start of infusion (i.e. 20 h after the end of infusion b/c infusion runs over 4 hrs).  Based on MTX level, leucovorin dose will need to be ordered and adjusted based on the daily MTX level.  Pt will need daily MTX levels and urine pH monitoring until MTX levels are <0.1 microM. (Note: Labs are sent to [facility] so takes 4-6 hrs to get result. Can find result by  going under "Chart Review" then "Media" and look for "Lab result scan").  Leucovorin: Follow Infomation for High-dose methotrexate-rescue- Leucovorin dosing (see [email or web address]). Leucovorin to start at 24 h after start of methotrexate infusion (leucovorin 10 mg/m2 IV q6h).   Home meds: Spoke to Dr. Rogue Bussing over the phone regarding restarting home meds. Ok to restart all four home meds; ranitidine/pepcid ok to restart as it is an H2 blocker and acyclovir and MTX are ok together per MD renal function wise.   1106 :  Urine pH @ 16:11 = 6.0.   Will increase NaBicarb drip to 150 ml/hr and recheck Urine pH 2 hrs after rate change.   1106:   Urine pH @ 17:00, 18:00, 19:00, 20:00  = 7.0,  Will increase NaBicarb drip to 175 ml/hr and recheck urine pH hourly for next 2 hrs.   11/7 2300 urine pH 8. Spoke with RN - switch to Q2H pH checks and will send additional bicarb bag now. No change in concentration or rate at this point.   11/7 0112 urine pH 9. Spoke with RN - continue current rate and recheck in 2 hours  11/7 0310 urine pH 9. Continue current rate and recheck in 2 hours  11/7 0459 urine pH 9. Continue current rate and recheck in 2 hours  Pharmacy will continue to follow.   Laural Benes, Pharm.D., BCPS Clinical Pharmacist 03/20/2016 12:03 AM

## 2016-03-20 NOTE — Progress Notes (Addendum)
MEDICATION RELATED CONSULT NOTE - Follow up  Pharmacy Consult for  High- Dose Methotrexate monitoring Indication: Monitoring of methotrexate levels and ordering leucovorin doses.   No Known Allergies  Patient Measurements: Height: 5' 2.5" (158.8 cm) Weight: 167 lb 6.4 oz (75.9 kg) IBW/kg (Calculated) : 51.25  Vital Signs: Temp: 98 F (36.7 C) (11/07 1225) Temp Source: Oral (11/07 1225) BP: 131/60 (11/07 1225) Pulse Rate: 62 (11/07 1225) Intake/Output from previous day: 11/06 0701 - 11/07 0700 In: 2722.8 [I.V.:1984.8; IV Piggyback:738] Out: [id number] [Urine:5325] Intake/Output from this shift: Total I/O In: 1684.6 [I.V.:1684.6] Out: 1750 [Urine:1750]  Labs:  Recent Labs  03/19/16 0829 03/20/16 0459  WBC 3.1* 5.7  HGB 11.3* 10.2*  HCT 32.1* 29.0*  PLT 182 142*  CREATININE 0.89 0.97  ALBUMIN 4.1  --   PROT 6.2*  --   AST 30  --   ALT 21  --   ALKPHOS 64  --   BILITOT 0.7  --    Estimated Creatinine Clearance: 58.7 mL/min (by C-G formula based on SCr of 0.97 mg/dL).   Microbiology: No results found for this or any previous visit (from the past 720 hour(s)).  Medical History: Past Medical History:  Diagnosis Date  . Arthritis   . Blood dyscrasia   . Cancer ([place])   . Chest pain, unspecified   . Diverticulosis 07/06/15  . Dysrhythmia   . Esophagitis 07/06/15  . Fatigue   . Gastritis 07/06/15  . GERD (gastroesophageal reflux disease)   . 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 Leg cramps   . Lymphoma ([place]) 09/14/15   Monoclonal B cell lymphoma  . Night sweats   . Osteoporosis   . Overweight(278.02)    Obesity  . Palpitations   . Shortness of breath dyspnea   . Tachycardia   . Thrombocytopenia (HCC)     Medications:  Scheduled:  . acyclovir  400 mg Oral BID  . apixaban  5 mg Oral BID  . famotidine  20 mg Oral BID  . leucovorin  10 mg/m2 Intravenous Q6H  . potassium chloride  40 mEq Oral Daily   Infusions:  . sodium chloride Stopped (03/19/16 2224)  .   sodium bicarbonate infusion 1/4 NS 1000 mL 125 mL/hr at 03/20/16 1320    Assessment: 60 yo female to start on high dose methotrexate infusion with Leucovorin rescue, hx large B-cell Lymphoma.  Baseline urine pH = 5.0 11/6 at 0937. MTX infusion started 11/6 at 1428.   Plan:  Do not start the methotrexate infusion until urine pH is at 7.0 and maintain > 7.0 during therapy; NaBicarb drip to continue at least 48h after MTX infusion. Per MD, check urine pH at least daily.   Sodium Bicarb Drip:  Goal Urine pH > 7.0 If urine pH less than goal consider increasing NaBicarb drip by 25 ml/hr, or if rate exceeds 175 ml/hr on the current NaBicarb drip, may need to increase bicarb in bag to 150 mEq. Checking urine pH every hour until pH >7.0 then once stabilizes and MTX infusion is complete consider checking every 2 hrs then every 4hrs etc . Care order instruction for RN to enter urine pH orders. Continue at least 48 hrs after Methotrexate infusion finished.  Methotrexate Levels:  Pt needs MTX level ordered at 24h after start of infusion (i.e. 20 h after the end of infusion b/c infusion runs over 4 hrs). --> level ordered for 11/7 at 1430. Based on MTX level, leucovorin dose will need to be ordered  and adjusted based on the daily MTX level.  Pt will need daily MTX levels and urine pH monitoring until MTX levels are <0.1 microM. (Note: Labs are sent to [facility] so takes 4-6 hrs to get result. Can find result by going under "Chart Review" then "Media" and look for "Lab result scan").  Leucovorin: Follow Infomation for High-dose methotrexate-rescue- Leucovorin dosing (see [email or web address]). Leucovorin to start at 24 h after start of methotrexate infusion. Ordered leucovorin 10 mg/m2 (=18 mg) IV q6h to start at 1430.    1106 :  Urine pH @ 16:11 = 6.0.   Will increase NaBicarb drip to 150 ml/hr and recheck Urine pH 2 hrs after rate change.   1106:   Urine pH @ 17:00, 18:00, 19:00, 20:00 = 7.0,  Will increase  NaBicarb drip to 175 ml/hr and recheck urine pH hourly for next 2 hrs.   11/7 2300 urine pH 8. Spoke with RN - switch to Q2H pH checks and will send additional bicarb bag now. No change in concentration or rate at this point.   11/7 0112 urine pH 9. Spoke with RN - continue current rate and recheck in 2 hours  11/7 0310 urine pH 9. Continue current rate and recheck in 2 hours  11/7 0459 urine pH 9. Continue current rate and recheck in 2 hours  11/7 0711 urine pH 9. Bicarb drip rate changed to 125 ml/hr by pharmacy per RN request. Recheck every 2 hours.   11/7 1100 urine pH 8.0 continue current rate  11/7 1500 urine pH 8.0, continue current rate  11/7 1900 urine pH 9.0, continue current rate and recheck in 4 hours  Pharmacy will continue to follow.   Lenis Noon, Pharm.D., BCPS Clinical Pharmacist 03/20/2016 6:20 PM

## 2016-03-20 NOTE — Progress Notes (Addendum)
MEDICATION RELATED CONSULT NOTE - Follow up  Pharmacy Consult for  High- Dose Methotrexate monitoring Indication: Monitoring of methotrexate levels and ordering leucovorin doses.   No Known Allergies  Patient Measurements: Height: 5' 2.5" (158.8 cm) Weight: 167 lb 6.4 oz (75.9 kg) IBW/kg (Calculated) : 51.25  Vital Signs: Temp: 97.8 F (36.6 C) (11/07 2029) Temp Source: Oral (11/07 2029) BP: 125/65 (11/07 2029) Pulse Rate: 70 (11/07 2029) Intake/Output from previous day: 11/06 0701 - 11/07 0700 In: 2722.8 [I.V.:1984.8; IV Piggyback:738] Out: [id number] [Urine:5325] Intake/Output from this shift: Total I/O In: -  Out: 1200 [Urine:1200]  Labs:  Recent Labs  03/19/16 0829 03/20/16 0459  WBC 3.1* 5.7  HGB 11.3* 10.2*  HCT 32.1* 29.0*  PLT 182 142*  CREATININE 0.89 0.97  ALBUMIN 4.1  --   PROT 6.2*  --   AST 30  --   ALT 21  --   ALKPHOS 64  --   BILITOT 0.7  --    Estimated Creatinine Clearance: 58.7 mL/min (by C-G formula based on SCr of 0.97 mg/dL).   Microbiology: No results found for this or any previous visit (from the past 720 hour(s)).  Medical History: Past Medical History:  Diagnosis Date  . Arthritis   . Blood dyscrasia   . Cancer ([place])   . Chest pain, unspecified   . Diverticulosis 07/06/15  . Dysrhythmia   . Esophagitis 07/06/15  . Fatigue   . Gastritis 07/06/15  . GERD (gastroesophageal reflux disease)   . 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 Leg cramps   . Lymphoma ([place]) 09/14/15   Monoclonal B cell lymphoma  . Night sweats   . Osteoporosis   . Overweight(278.02)    Obesity  . Palpitations   . Shortness of breath dyspnea   . Tachycardia   . Thrombocytopenia (HCC)     Medications:  Scheduled:  . acyclovir  400 mg Oral BID  . apixaban  5 mg Oral BID  . famotidine  20 mg Oral BID  . leucovorin  10 mg/m2 Intravenous Q6H  . potassium chloride  40 mEq Oral Daily   Infusions:  . sodium chloride Stopped (03/19/16 2224)  .  sodium  bicarbonate infusion 1/4 NS 1000 mL 125 mL/hr at 03/20/16 2009    Assessment: 60 yo female to start on high dose methotrexate infusion with Leucovorin rescue, hx large B-cell Lymphoma.  Baseline urine pH = 5.0 11/6 at 0937. MTX infusion started 11/6 at 1428.   Plan:  Do not start the methotrexate infusion until urine pH is at 7.0 and maintain > 7.0 during therapy; NaBicarb drip to continue at least 48h after MTX infusion. Per MD, check urine pH at least daily.   Sodium Bicarb Drip:  Goal Urine pH > 7.0 If urine pH less than goal consider increasing NaBicarb drip by 25 ml/hr, or if rate exceeds 175 ml/hr on the current NaBicarb drip, may need to increase bicarb in bag to 150 mEq. Checking urine pH every hour until pH >7.0 then once stabilizes and MTX infusion is complete consider checking every 2 hrs then every 4hrs etc . Care order instruction for RN to enter urine pH orders. Continue at least 48 hrs after Methotrexate infusion finished.  Methotrexate Levels:  Pt needs MTX level ordered at 24h after start of infusion (i.e. 20 h after the end of infusion b/c infusion runs over 4 hrs). --> level ordered for 11/7 at 1430. Based on MTX level, leucovorin dose will need to be ordered  and adjusted based on the daily MTX level.  Pt will need daily MTX levels and urine pH monitoring until MTX levels are <0.1 microM. (Note: Labs are sent to [facility] so takes 4-6 hrs to get result. Can find result by going under "Chart Review" then "Media" and look for "Lab result scan").  03/20/2016 1351 methotrexate level 2.41 mmol/L. Continue leucovorin as ordered.  Leucovorin: Follow Infomation for High-dose methotrexate-rescue- Leucovorin dosing (see [email or web address]). Leucovorin to start at 24 h after start of methotrexate infusion. Ordered leucovorin 10 mg/m2 (=18 mg) IV q6h to start at 1430.   11/7 1500 urine pH 8.0, continue current rate  11/7 1900 urine pH 9.0, continue current rate and recheck in 4 hours  11/7  2240 urine pH 9. Continue current rate and recheck in 4 hours.  11/8 0215 urine pH 9. Continue current rate and recheck in 4 hours.  11/8 0649 urine pH 9. Continue current rate and recheck in 4 hours.  Pharmacy will continue to follow.   Laural Benes, Pharm.D., BCPS Clinical Pharmacist 03/20/2016 11:55 PM

## 2016-03-20 NOTE — Progress Notes (Signed)
MEDICATION RELATED CONSULT NOTE - Follow up  Pharmacy Consult for  High- Dose Methotrexate monitoring Indication: Monitoring of methotrexate levels and ordering leucovorin doses.   No Known Allergies  Patient Measurements: Height: 5' 2.5" (158.8 cm) Weight: 167 lb 6.4 oz (75.9 kg) IBW/kg (Calculated) : 51.25  Vital Signs: Temp: 98.3 F (36.8 C) (11/07 0424) Temp Source: Oral (11/07 0424) BP: 144/54 (11/07 0424) Pulse Rate: 56 (11/07 0424) Intake/Output from previous day: 11/06 0701 - 11/07 0700 In: 2722.8 [I.V.:1984.8; IV Piggyback:738] Out: [id number] [Urine:5325] Intake/Output from this shift: No intake/output data recorded.  Labs:  Recent Labs  03/19/16 0829 03/20/16 0459  WBC 3.1* 5.7  HGB 11.3* 10.2*  HCT 32.1* 29.0*  PLT 182 142*  CREATININE 0.89 0.97  ALBUMIN 4.1  --   PROT 6.2*  --   AST 30  --   ALT 21  --   ALKPHOS 64  --   BILITOT 0.7  --    Estimated Creatinine Clearance: 58.7 mL/min (by C-G formula based on SCr of 0.97 mg/dL).   Microbiology: No results found for this or any previous visit (from the past 720 hour(s)).  Medical History: Past Medical History:  Diagnosis Date  . Arthritis   . Blood dyscrasia   . Cancer ([place])   . Chest pain, unspecified   . Diverticulosis 07/06/15  . Dysrhythmia   . Esophagitis 07/06/15  . Fatigue   . Gastritis 07/06/15  . GERD (gastroesophageal reflux disease)   . 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 Leg cramps   . Lymphoma ([place]) 09/14/15   Monoclonal B cell lymphoma  . Night sweats   . Osteoporosis   . Overweight(278.02)    Obesity  . Palpitations   . Shortness of breath dyspnea   . Tachycardia   . Thrombocytopenia (HCC)     Medications:  Scheduled:  . acyclovir  400 mg Oral BID  . apixaban  5 mg Oral BID  . famotidine  20 mg Oral BID  . leucovorin  10 mg/m2 Intravenous Q6H   Infusions:  . sodium chloride Stopped (03/19/16 2224)  .  sodium bicarbonate infusion 1/4 NS 1000 mL 175 mL/hr at 03/20/16 [id number]     Assessment: 60 yo female to start on high dose methotrexate infusion with Leucovorin rescue, hx large B-cell Lymphoma.  Baseline urine pH = 5.0 11/6 at 0937. MTX infusion started 11/6 at 1428.   Plan:  Do not start the methotrexate infusion until urine pH is at 7.0 and maintain > 7.0 during therapy; NaBicarb drip to continue at least 48h after MTX infusion. Per MD, check urine pH at least daily.   Sodium Bicarb Drip:  Goal Urine pH > 7.0 If urine pH less than goal consider increasing NaBicarb drip by 25 ml/hr, or if rate exceeds 175 ml/hr on the current NaBicarb drip, may need to increase bicarb in bag to 150 mEq. Checking urine pH every hour until pH >7.0 then once stabilizes and MTX infusion is complete consider checking every 2 hrs then every 4hrs etc . Care order instruction for RN to enter urine pH orders. Continue at least 48 hrs after Methotrexate infusion finished.  Methotrexate Levels:  Pt needs MTX level ordered at 24h after start of infusion (i.e. 20 h after the end of infusion b/c infusion runs over 4 hrs). --> level ordered for 11/7 at 1430. Based on MTX level, leucovorin dose will need to be ordered and adjusted based on the daily MTX level.  Pt will need daily  MTX levels and urine pH monitoring until MTX levels are <0.1 microM. (Note: Labs are sent to [facility] so takes 4-6 hrs to get result. Can find result by going under "Chart Review" then "Media" and look for "Lab result scan").  Leucovorin: Follow Infomation for High-dose methotrexate-rescue- Leucovorin dosing (see [email or web address]). Leucovorin to start at 24 h after start of methotrexate infusion. Ordered leucovorin 10 mg/m2 (=18 mg) IV q6h to start at 1430.    1106 :  Urine pH @ 16:11 = 6.0.   Will increase NaBicarb drip to 150 ml/hr and recheck Urine pH 2 hrs after rate change.   1106:   Urine pH @ 17:00, 18:00, 19:00, 20:00 = 7.0,  Will increase NaBicarb drip to 175 ml/hr and recheck urine pH hourly for next 2 hrs.    11/7 2300 urine pH 8. Spoke with RN - switch to Q2H pH checks and will send additional bicarb bag now. No change in concentration or rate at this point.   11/7 0112 urine pH 9. Spoke with RN - continue current rate and recheck in 2 hours  11/7 0310 urine pH 9. Continue current rate and recheck in 2 hours  11/7 0459 urine pH 9. Continue current rate and recheck in 2 hours  11/7 0711 urine pH 9. Bicarb drip rate changed to 125 ml/hr by pharmacy per RN request. Recheck every 2 hours.   11/7 1100 urine pH 8.0 continue current rate  Pharmacy will continue to follow.   Rocky Morel, Pharm.D., BCPS Clinical Pharmacist 03/20/2016 7:26 AM

## 2016-03-20 NOTE — Progress Notes (Signed)
[facility] Oncology Progress Note  Name: Stacy Murillo      MRN: [id number]    Location: 294T/[id number]  Date: 03/20/2016 Time:6:20 PM   Subjective: Interval History:Stacy Murillo is day 2 since high dose MTX given yesterday for DLBCL CNS prophylaxis Doing well, no fever, no rashes, no diarrhea, no cough  No bleeding or bruisiing. Urine ph is 7 this Am, bicarb drip begun,  Objective: Vital signs in last 24 hours: Temp:  [98 F (36.7 C)-98.5 F (36.9 C)] 98 F (36.7 C) (11/07 1225) Pulse Rate:  [56-62] 62 (11/07 1225) Resp:  [16-18] 16 (11/07 1225) BP: (131-144)/(54-68) 131/60 (11/07 1225) SpO2:  [97 %-100 %] 100 % (11/07 1225)    Intake/Output from previous day: 11/06 0701 - 11/07 0700 In: 2722.8 [I.V.:1984.8] Out: 5325 [Urine:5325]    PHYSICAL EXAM: AAOX3, PALLOR, MILD, NO RASHES , NO Petechiae Iv access clean,  No extremity edema    Studies/Results: Results for orders placed or performed during the hospital encounter of 03/19/16 (from the past 48 hour(s))  Urine PH     Status: None   Collection Time: 03/19/16  9:37 AM  Result Value Ref Range   pH 5.0 5.0 - 8.0  Urine PH     Status: None   Collection Time: 03/19/16 11:14 AM  Result Value Ref Range   pH 6.0 5.0 - 8.0  Urine PH     Status: None   Collection Time: 03/19/16 12:16 PM  Result Value Ref Range   pH 7.0 5.0 - 8.0  Urine PH     Status: None   Collection Time: 03/19/16  1:15 PM  Result Value Ref Range   pH 7.0 5.0 - 8.0  Urine PH     Status: None   Collection Time: 03/19/16  2:15 PM  Result Value Ref Range   pH 6.0 5.0 - 8.0  Urine PH     Status: None   Collection Time: 03/19/16  3:20 PM  Result Value Ref Range   pH 7.0 5.0 - 8.0  Urine PH     Status: None   Collection Time: 03/19/16  4:11 PM  Result Value Ref Range   pH 6.0 5.0 - 8.0  Urine PH     Status: None   Collection Time: 03/19/16  5:05 PM  Result Value Ref Range   pH 7.0 5.0 - 8.0  Urine PH     Status: None   Collection Time:  03/19/16  6:03 PM  Result Value Ref Range   pH 7.0 5.0 - 8.0  Urine PH     Status: None   Collection Time: 03/19/16  7:11 PM  Result Value Ref Range   pH 7.0 5.0 - 8.0  Urine PH     Status: None   Collection Time: 03/19/16  8:11 PM  Result Value Ref Range   pH 7.0 5.0 - 8.0  Urine PH     Status: None   Collection Time: 03/19/16  9:22 PM  Result Value Ref Range   pH 7.0 5.0 - 8.0  Urine PH     Status: None   Collection Time: 03/19/16 10:10 PM  Result Value Ref Range   pH 7.0 5.0 - 8.0  Urine PH     Status: None   Collection Time: 03/19/16 11:00 PM  Result Value Ref Range   pH 8.0 5.0 - 8.0  Urine PH     Status: Abnormal   Collection Time: 03/20/16  1:12  AM  Result Value Ref Range   pH 9.0 (H) 5.0 - 8.0  Urine PH     Status: Abnormal   Collection Time: 03/20/16  3:10 AM  Result Value Ref Range   pH 9.0 (H) 5.0 - 8.0  Basic metabolic panel     Status: Abnormal   Collection Time: 03/20/16  4:59 AM  Result Value Ref Range   Sodium 141 135 - 145 mmol/L   Potassium 3.3 (L) 3.5 - 5.1 mmol/L   Chloride 103 101 - 111 mmol/L   CO2 31 22 - 32 mmol/L   Glucose, Bld 117 (H) 65 - 99 mg/dL   BUN 19 6 - 20 mg/dL   Creatinine, Ser 0.97 0.44 - 1.00 mg/dL   Calcium 8.8 (L) 8.9 - 10.3 mg/dL   GFR calc non Af Amer >60 >60 mL/min   GFR calc Af Amer >60 >60 mL/min    Comment: (NOTE) The eGFR has been calculated using the CKD EPI equation. This calculation has not been validated in all clinical situations. eGFR's persistently <60 mL/min signify possible Chronic Kidney Disease.    Anion gap 7 5 - 15  CBC WITH DIFFERENTIAL     Status: Abnormal   Collection Time: 03/20/16  4:59 AM  Result Value Ref Range   WBC 5.7 3.6 - 11.0 K/uL   RBC 3.27 (L) 3.80 - 5.20 MIL/uL   Hemoglobin 10.2 (L) 12.0 - 16.0 g/dL   HCT 29.0 (L) 35.0 - 47.0 %   MCV 88.7 80.0 - 100.0 fL   MCH 31.3 26.0 - 34.0 pg   MCHC 35.3 32.0 - 36.0 g/dL   RDW 13.4 11.5 - 14.5 %   Platelets 142 (L) 150 - 440 K/uL    Neutrophils Relative % 91 %   Neutro Abs 5.2 1.4 - 6.5 K/uL   Lymphocytes Relative 6 %   Lymphs Abs 0.3 (L) 1.0 - 3.6 K/uL   Monocytes Relative 3 %   Monocytes Absolute 0.2 0.2 - 0.9 K/uL   Eosinophils Relative 0 %   Eosinophils Absolute 0.0 0 - 0.7 K/uL   Basophils Relative 0 %   Basophils Absolute 0.0 0 - 0.1 K/uL  Urine PH     Status: Abnormal   Collection Time: 03/20/16  4:59 AM  Result Value Ref Range   pH 9.0 (H) 5.0 - 8.0  Urine PH     Status: Abnormal   Collection Time: 03/20/16  7:11 AM  Result Value Ref Range   pH 9.0 (H) 5.0 - 8.0  Urine PH     Status: None   Collection Time: 03/20/16 11:00 AM  Result Value Ref Range   pH 8.0 5.0 - 8.0  Urine PH     Status: None   Collection Time: 03/20/16  2:59 PM  Result Value Ref Range   pH 8.0 5.0 - 8.0   No results found.    Assessment/Plan:  Continue bicarb drip MTX level daily until less than 0.1MicroM/l Urine ph DAILY Add oral potassium Creatinine is stable Calcium is 8.8, Bl, watch closley Continue Eliquis Urine output should be maintained at a tleast 100 ml/hr    [employer]

## 2016-03-21 DIAGNOSIS — D61818 Other pancytopenia: Secondary | ICD-10-CM

## 2016-03-21 DIAGNOSIS — E876 Hypokalemia: Secondary | ICD-10-CM

## 2016-03-21 LAB — BASIC METABOLIC PANEL
ANION GAP: 7 (ref 5–15)
BUN: 16 mg/dL (ref 6–20)
CHLORIDE: 105 mmol/L (ref 101–111)
CO2: 34 mmol/L — ABNORMAL HIGH (ref 22–32)
Calcium: 8.5 mg/dL — ABNORMAL LOW (ref 8.9–10.3)
Creatinine, Ser: 0.99 mg/dL (ref 0.44–1.00)
GFR calc Af Amer: >60 mL/min (ref >60)
GLUCOSE: 84 mg/dL (ref 65–99)
POTASSIUM: 3.6 mmol/L (ref 3.5–5.1)
Sodium: 146 mmol/L — ABNORMAL HIGH (ref 135–145)

## 2016-03-21 LAB — CBC WITH DIFFERENTIAL/PLATELET
BASOS ABS: 0 10*3/uL (ref 0–0.1)
Basophils Relative: 1 %
Eosinophils Absolute: 0.1 10*3/uL (ref 0–0.7)
Eosinophils Relative: 1 %
HEMATOCRIT: 28.4 % — AB (ref 35.0–47.0)
Hemoglobin: 10 g/dL — ABNORMAL LOW (ref 12.0–16.0)
LYMPHS ABS: 0.9 10*3/uL — AB (ref 1.0–3.6)
LYMPHS PCT: 14 %
MCH: 31.4 pg (ref 26.0–34.0)
MCHC: 35.2 g/dL (ref 32.0–36.0)
MCV: 89.3 fL (ref 80.0–100.0)
MONO ABS: 0.4 10*3/uL (ref 0.2–0.9)
Monocytes Relative: 7 %
NEUTROS ABS: 4.9 10*3/uL (ref 1.4–6.5)
Neutrophils Relative %: 77 %
Platelets: 139 10*3/uL — ABNORMAL LOW (ref 150–440)
RBC: 3.18 MIL/uL — AB (ref 3.80–5.20)
RDW: 13.8 % (ref 11.5–14.5)
WBC: 6.2 10*3/uL (ref 3.6–11.0)

## 2016-03-21 LAB — URINE PH
pH: 9 — ABNORMAL HIGH (ref 5.0–8.0)
pH: 9 — ABNORMAL HIGH (ref 5.0–8.0)
pH: 9 — ABNORMAL HIGH (ref 5.0–8.0)
pH: 9 — ABNORMAL HIGH (ref 5.0–8.0)
pH: 9 — ABNORMAL HIGH (ref 5.0–8.0)

## 2016-03-21 NOTE — Progress Notes (Signed)
MEDICATION RELATED CONSULT NOTE - Follow up  Pharmacy Consult for  High- Dose Methotrexate monitoring Indication: Monitoring of methotrexate levels and ordering leucovorin doses.   No Known Allergies  Patient Measurements: Height: 5' 2.5" (158.8 cm) Weight: 167 lb 6.4 oz (75.9 kg) IBW/kg (Calculated) : 51.25  Vital Signs: Temp: 98.9 F (37.2 C) (11/08 2114) Temp Source: Oral (11/08 2114) BP: 136/66 (11/08 2114) Pulse Rate: 62 (11/08 2114) Intake/Output from previous day: 11/07 0701 - 11/08 0700 In: 1684.6 [I.V.:1684.6] Out: [id number] [Urine:4375] Intake/Output from this shift: Total I/O In: 3833.3 [I.V.:3833.3] Out: -   Labs:  Recent Labs  03/19/16 0829 03/20/16 0459 03/21/16 0455  WBC 3.1* 5.7 6.2  HGB 11.3* 10.2* 10.0*  HCT 32.1* 29.0* 28.4*  PLT 182 142* 139*  CREATININE 0.89 0.97 0.99  ALBUMIN 4.1  --   --   PROT 6.2*  --   --   AST 30  --   --   ALT 21  --   --   ALKPHOS 64  --   --   BILITOT 0.7  --   --    Estimated Creatinine Clearance: 57.6 mL/min (by C-G formula based on SCr of 0.99 mg/dL).   Microbiology: No results found for this or any previous visit (from the past 720 hour(s)).  Medical History: Past Medical History:  Diagnosis Date  . Arthritis   . Blood dyscrasia   . Cancer ([place])   . Chest pain, unspecified   . Diverticulosis 07/06/15  . Dysrhythmia   . Esophagitis 07/06/15  . Fatigue   . Gastritis 07/06/15  . GERD (gastroesophageal reflux disease)   . 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 Leg cramps   . Lymphoma ([place]) 09/14/15   Monoclonal B cell lymphoma  . Night sweats   . Osteoporosis   . Overweight(278.02)    Obesity  . Palpitations   . Shortness of breath dyspnea   . Tachycardia   . Thrombocytopenia (HCC)     Medications:  Scheduled:  . acyclovir  400 mg Oral BID  . apixaban  5 mg Oral BID  . famotidine  20 mg Oral BID  . leucovorin  10 mg/m2 Intravenous Q6H  . potassium chloride  40 mEq Oral Daily   Infusions:  . sodium  chloride Stopped (03/19/16 2224)  .  sodium bicarbonate infusion 1/4 NS 1000 mL 125 mL/hr at 03/21/16 2153    Assessment: 60 yo female to start on high dose methotrexate infusion with Leucovorin rescue, hx large B-cell Lymphoma.  Baseline urine pH = 5.0 11/6 at 0937. MTX infusion started 11/6 at 1428.   Plan:  Do not start the methotrexate infusion until urine pH is at 7.0 and maintain > 7.0 during therapy; NaBicarb drip to continue at least 48h after MTX infusion. Per MD, check urine pH at least daily.   Sodium Bicarb Drip:  Goal Urine pH > 7.0 If urine pH less than goal consider increasing NaBicarb drip by 25 ml/hr, or if rate exceeds 175 ml/hr on the current NaBicarb drip, may need to increase bicarb in bag to 150 mEq. Checking urine pH every hour until pH >7.0 then once stabilizes and MTX infusion is complete consider checking every 2 hrs then every 4hrs etc . Care order instruction for RN to enter urine pH orders. Continue at least 48 hrs after Methotrexate infusion finished.  Methotrexate Levels:  Pt needs MTX level ordered at 24h after start of infusion (i.e. 20 h after the end of infusion b/c  infusion runs over 4 hrs). --> level ordered for 11/7 at 1430. Based on MTX level, leucovorin dose will need to be ordered and adjusted based on the daily MTX level.  Pt will need daily MTX levels and urine pH monitoring until MTX levels are <0.1 microM. (Note: Labs are sent to [facility] so takes 4-6 hrs to get result. Can find result by going under "Chart Review" then "Media" and look for "Lab result scan").  03/20/2016 1351 methotrexate level 2.41 mmol/L. Continue leucovorin as ordered.  03/21/2016 1430 MTX = 0.22 mmol/L. Continue leucovorin as ordered.   Leucovorin: Follow Infomation for High-dose methotrexate-rescue- Leucovorin dosing (see [email or web address]). Leucovorin to start at 24 h after start of methotrexate infusion. Ordered leucovorin 10 mg/m2 (=18 mg) IV q6h to start at 1430.   11/7 1500  urine pH 8.0, continue current rate  11/7 1900 urine pH 9.0, continue current rate and recheck in 4 hours  11/7 2240 urine pH 9. Continue current rate and recheck in 4 hours.  11/8 0215 urine pH 9. Continue current rate and recheck in 4 hours.  11/8 0649 urine pH 9. Continue current rate and recheck in 4 hours.  11/8 1121 urine pH 9. Continue current rate and recheck in 4 hours.  Pharmacy will continue to follow.   Granite Godman D, Pharm.D. Clinical Pharmacist 03/21/2016 10:23 PM

## 2016-03-21 NOTE — Plan of Care (Signed)
Problem: Education: Goal: Knowledge of [facility] Education information/materials will improve Outcome: Progressing Pt know. Re   meds / urine/  metho level today 2.14. Dr b saw this am  Problem: Tissue Perfusion: Goal: Risk factors for ineffective tissue perfusion will decrease Outcome: Progressing On eliquis  Problem: Fluid Volume: Goal: Ability to maintain a balanced intake and output will improve Outcome: Progressing Chemo precautions maintained  Problem: Bowel/Gastric: Goal: Will not experience complications related to bowel motility Outcome: Progressing bm this am but   Constipated stool will  Call to add  Elissa Hefty

## 2016-03-21 NOTE — Progress Notes (Signed)
Stacy Murillo   DOB:12-07-1955   [id number]    Subjective: Patient's appetite is good. No nausea no vomiting. No fevers. No chills. No chest pain. No SOB.  No sores in the mouth.   Objective:  Vitals:   03/21/16 0427 03/21/16 0805  BP: (!) 102/44 131/62  Pulse: 64 72  Resp: (!) 21 20  Temp: 97.9 F (36.6 C) 98 F (36.7 C)     Intake/Output Summary (Last 24 hours) at 03/21/16 1305 Last data filed at 03/21/16 0800  Gross per 24 hour  Intake          1684.58 ml  Output             3425 ml  Net         -1740.42 ml    GENERAL Alert, no distress and comfortable. She is alone.  EYES: no pallor or icterus OROPHARYNX: no thrush or ulceration. NECK: supple, no masses felt LYMPH:  no palpable lymphadenopathy in the cervical, axillary or inguinal regions LUNGS: decreased breath sounds to auscultation at bases and  No wheeze or crackles HEART/CVS: regular rate & rhythm and no murmurs; No lower extremity edema ABDOMEN: abdomen soft, tender  on deep palpation. and normal bowel sounds Musculoskeletal:no cyanosis of digits and no clubbing  PSYCH: alert & oriented x 3 with fluent speech NEURO: no focal motor/sensory deficits SKIN:  no rashes or significant lesions   Labs:  Lab Results  Component Value Date   WBC 6.2 03/21/2016   HGB 10.0 (L) 03/21/2016   HCT 28.4 (L) 03/21/2016   MCV 89.3 03/21/2016   PLT 139 (L) 03/21/2016   NEUTROABS 4.9 03/21/2016    Lab Results  Component Value Date   NA 146 (H) 03/21/2016   K 3.6 03/21/2016   CL 105 03/21/2016   CO2 34 (H) 03/21/2016    Studies:  No results found.  Assessment & Plan:  # 60 year old female patient with a history of diffuse large B-cell lymphoma/involving peripheral blood status post 6cycles of R CHOP currently admitted to the hospital for high-dose methotrexate cycle #4; day #3 today.    # Intracranial prophylaxis-s/p #4 high-dose methotrexate- 24 hours 2.41; adjust leucovorin based on Mxt levels. Appreciate  pharmacy monitoring. Goal MXT level- 0.1- 0.05.   # Creatinine- today- 0.95 ; will monitor closely; hypokalemia- K dur 40 BID.   # Diffuse large B-cell lymphoma status post 6 cycles of R CHOP- PET scan shows complete response.  # I reviewed the blood work- with the patient in detail   Cammie Sickle, MD 03/21/2016  1:05 PM

## 2016-03-21 NOTE — Progress Notes (Signed)
MEDICATION RELATED CONSULT NOTE - Follow up  Pharmacy Consult for  High- Dose Methotrexate monitoring Indication: Monitoring of methotrexate levels and ordering leucovorin doses.   No Known Allergies  Patient Measurements: Height: 5' 2.5" (158.8 cm) Weight: 167 lb 6.4 oz (75.9 kg) IBW/kg (Calculated) : 51.25  Vital Signs: Temp: 98 F (36.7 C) (11/08 0805) Temp Source: Oral (11/08 0805) BP: 131/62 (11/08 0805) Pulse Rate: 72 (11/08 0805) Intake/Output from previous day: 11/07 0701 - 11/08 0700 In: 1684.6 [I.V.:1684.6] Out: [id number] [Urine:4375] Intake/Output from this shift: No intake/output data recorded.  Labs:  Recent Labs  03/19/16 0829 03/20/16 0459 03/21/16 0455  WBC 3.1* 5.7 6.2  HGB 11.3* 10.2* 10.0*  HCT 32.1* 29.0* 28.4*  PLT 182 142* 139*  CREATININE 0.89 0.97 0.99  ALBUMIN 4.1  --   --   PROT 6.2*  --   --   AST 30  --   --   ALT 21  --   --   ALKPHOS 64  --   --   BILITOT 0.7  --   --    Estimated Creatinine Clearance: 57.6 mL/min (by C-G formula based on SCr of 0.99 mg/dL).   Microbiology: No results found for this or any previous visit (from the past 720 hour(s)).  Medical History: Past Medical History:  Diagnosis Date  . Arthritis   . Blood dyscrasia   . Cancer ([place])   . Chest pain, unspecified   . Diverticulosis 07/06/15  . Dysrhythmia   . Esophagitis 07/06/15  . Fatigue   . Gastritis 07/06/15  . GERD (gastroesophageal reflux disease)   . 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 Leg cramps   . 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 Night sweats   . Osteoporosis   . Overweight(278.02)    Obesity  . Palpitations   . Shortness of breath dyspnea   . Tachycardia   . Thrombocytopenia (HCC)     Medications:  Scheduled:  . acyclovir  400 mg Oral BID  . apixaban  5 mg Oral BID  . famotidine  20 mg Oral BID  . leucovorin  10 mg/m2 Intravenous Q6H  . potassium chloride  40 mEq Oral Daily   Infusions:  . sodium chloride  Stopped (03/19/16 2224)  .  sodium bicarbonate infusion 1/4 NS 1000 mL 125 mL/hr at 03/21/16 0500    Assessment: 60 yo female to start on high dose methotrexate infusion with Leucovorin rescue, hx large B-cell Lymphoma.  Baseline urine pH = 5.0 11/6 at 0937. MTX infusion started 11/6 at 1428.   Plan:  Do not start the methotrexate infusion until urine pH is at 7.0 and maintain > 7.0 during therapy; NaBicarb drip to continue at least 48h after MTX infusion. Per MD, check urine pH at least daily.   Sodium Bicarb Drip:  Goal Urine pH > 7.0 If urine pH less than goal consider increasing NaBicarb drip by 25 ml/hr, or if rate exceeds 175 ml/hr on the current NaBicarb drip, may need to increase bicarb in bag to 150 mEq. Checking urine pH every hour until pH >7.0 then once stabilizes and MTX infusion is complete consider checking every 2 hrs then every 4hrs etc . Care order instruction for RN to enter urine pH orders. Continue at least 48 hrs after Methotrexate infusion finished.  Methotrexate Levels:  Pt needs MTX level ordered at 24h after start of infusion (i.e. 20 h after the end of infusion b/c infusion runs over 4  hrs). --> level ordered for 11/7 at 1430. Based on MTX level, leucovorin dose will need to be ordered and adjusted based on the daily MTX level.  Pt will need daily MTX levels and urine pH monitoring until MTX levels are <0.1 microM. (Note: Labs are sent to [facility] so takes 4-6 hrs to get result. Can find result by going under "Chart Review" then "Media" and look for "Lab result scan").  03/20/2016 1351 methotrexate level 2.41 mmol/L. Continue leucovorin as ordered.  Leucovorin: Follow Infomation for High-dose methotrexate-rescue- Leucovorin dosing (see [email or web address]). Leucovorin to start at 24 h after start of methotrexate infusion. Ordered leucovorin 10 mg/m2 (=18 mg) IV q6h to start at 1430.   11/7 1500 urine pH 8.0, continue current rate  11/7 1900 urine pH 9.0, continue current  rate and recheck in 4 hours  11/7 2240 urine pH 9. Continue current rate and recheck in 4 hours.  11/8 0215 urine pH 9. Continue current rate and recheck in 4 hours.  11/8 0649 urine pH 9. Continue current rate and recheck in 4 hours.  11/8 1121 urine pH 9. Continue current rate and recheck in 4 hours.  Pharmacy will continue to follow.   Forestine Macho M Bedie Dominey, Pharm.D. Clinical Pharmacist 03/21/2016 11:52 AM

## 2016-03-22 LAB — URINE PH
PH: 9 — AB (ref 5.0–8.0)
PH: 9 — AB (ref 5.0–8.0)
pH: 9 — ABNORMAL HIGH (ref 5.0–8.0)
pH: 9 — ABNORMAL HIGH (ref 5.0–8.0)
pH: 9 — ABNORMAL HIGH (ref 5.0–8.0)
pH: 9 — ABNORMAL HIGH (ref 5.0–8.0)
pH: 9 — ABNORMAL HIGH (ref 5.0–8.0)

## 2016-03-22 LAB — BASIC METABOLIC PANEL
ANION GAP: 3 — AB (ref 5–15)
BUN: 11 mg/dL (ref 6–20)
CALCIUM: 8.5 mg/dL — AB (ref 8.9–10.3)
CHLORIDE: 104 mmol/L (ref 101–111)
CO2: 36 mmol/L — AB (ref 22–32)
Creatinine, Ser: 1.04 mg/dL — ABNORMAL HIGH (ref 0.44–1.00)
GFR calc Af Amer: >60 mL/min (ref >60)
GFR calc non Af Amer: 57 mL/min — ABNORMAL LOW (ref >60)
GLUCOSE: 92 mg/dL (ref 65–99)
Potassium: 3.4 mmol/L — ABNORMAL LOW (ref 3.5–5.1)
Sodium: 143 mmol/L (ref 135–145)

## 2016-03-22 LAB — CBC WITH DIFFERENTIAL/PLATELET
BASOS PCT: 1 %
Basophils Absolute: 0 10*3/uL (ref 0–0.1)
Eosinophils Absolute: 0.1 10*3/uL (ref 0–0.7)
Eosinophils Relative: 3 %
HEMATOCRIT: 27.7 % — AB (ref 35.0–47.0)
HEMOGLOBIN: 9.5 g/dL — AB (ref 12.0–16.0)
LYMPHS ABS: 0.7 10*3/uL — AB (ref 1.0–3.6)
LYMPHS PCT: 26 %
MCH: 31 pg (ref 26.0–34.0)
MCHC: 34.3 g/dL (ref 32.0–36.0)
MCV: 90.5 fL (ref 80.0–100.0)
MONO ABS: 0 10*3/uL — AB (ref 0.2–0.9)
MONOS PCT: 2 %
NEUTROS ABS: 1.8 10*3/uL (ref 1.4–6.5)
Neutrophils Relative %: 68 %
Platelets: 128 10*3/uL — ABNORMAL LOW (ref 150–440)
RBC: 3.06 MIL/uL — ABNORMAL LOW (ref 3.80–5.20)
RDW: 13.5 % (ref 11.5–14.5)
WBC: 2.7 10*3/uL — ABNORMAL LOW (ref 3.6–11.0)

## 2016-03-22 LAB — MISCELLANEOUS TEST

## 2016-03-22 NOTE — Progress Notes (Signed)
Joslin B [place]   DOB:Sep 20, 1955   [id number]    Subjective: No nausea no vomiting. No sores in the mouth. No fever no chills. Eager to go home.   Objective:  Vitals:   03/21/16 2114 03/22/16 0456  BP: 136/66 (!) 151/73  Pulse: 62 (!) 59  Resp: 18 20  Temp: 98.9 F (37.2 C) 98.5 F (36.9 C)     Intake/Output Summary (Last 24 hours) at 03/22/16 0818 Last data filed at 03/22/16 0557  Gross per 24 hour  Intake          4961.66 ml  Output             5700 ml  Net          -738.34 ml    GENERAL Alert, no distress and comfortable. She is alone.  EYES: no pallor or icterus OROPHARYNX: no thrush or ulceration. NECK: supple, no masses felt LYMPH:  no palpable lymphadenopathy in the cervical, axillary or inguinal regions LUNGS: decreased breath sounds to auscultation at bases and  No wheeze or crackles HEART/CVS: regular rate & rhythm and no murmurs; No lower extremity edema ABDOMEN: abdomen soft, tender  on deep palpation. and normal bowel sounds Musculoskeletal:no cyanosis of digits and no clubbing  PSYCH: alert & oriented x 3 with fluent speech NEURO: no focal motor/sensory deficits SKIN:  no rashes or significant lesions   Labs:  Lab Results  Component Value Date   WBC 2.7 (L) 03/22/2016   HGB 9.5 (L) 03/22/2016   HCT 27.7 (L) 03/22/2016   MCV 90.5 03/22/2016   PLT 128 (L) 03/22/2016   NEUTROABS 1.8 03/22/2016    Lab Results  Component Value Date   NA 143 03/22/2016   K 3.4 (L) 03/22/2016   CL 104 03/22/2016   CO2 36 (H) 03/22/2016    Studies:  No results found.  Assessment & Plan:  # 60 year old female patient with a history of diffuse large B-cell lymphoma/involving peripheral blood status post 6cycles of R CHOP currently admitted to the hospital for high-dose methotrexate cycle #4; day #4 today.    # Intracranial prophylaxis-s/p #4 high-dose methotrexate- 24 hours 2.41; 48 hours- 0.22; continue to adjust leucovorin based on Mxt levels. Appreciate pharmacy  monitoring. Goal MXT level- 0.1- 0.05.   # Creatinine- today- 1.04 ; will monitor closely; hypokalemia-3.4 on K dur 40 BID.   # Mild pancytopenia- 2.7/ANC- 1.8; hb 9.5/ platelets- 128- 2 FOR NOW. IF THE ANC LESS THAN THOUSAND RECOMMEND NEUPOGEN.  # Diffuse large B-cell lymphoma status post 6 cycles of R CHOP- PET scan shows complete response.  # I reviewed the blood work- with the patient in detail   Cammie Sickle, MD 03/22/2016  8:18 AM

## 2016-03-22 NOTE — Care Management (Signed)
Admitted to [facility] with the diagnosis of lymphoma cell diffuse. Lives with husband Broadus John [phone]). Sees Dr. Rogue Bussing at the [facility] for the diagnosis of diffused large B cell lymphoma. Received 6 cycles of R CHOP. Today will receive  Cycle 4/4 of chemotherapy. Possible discharge Friday. No follow-up needs identified. Shelbie Ammons RN MSN CCM Care Management [phone]

## 2016-03-22 NOTE — Progress Notes (Signed)
MEDICATION RELATED CONSULT NOTE - Follow up  Pharmacy Consult for  High- Dose Methotrexate monitoring Indication: Monitoring of methotrexate levels and ordering leucovorin doses.   No Known Allergies  Patient Measurements: Height: 5' 2.5" (158.8 cm) Weight: 167 lb 6.4 oz (75.9 kg) IBW/kg (Calculated) : 51.25  Vital Signs: Temp: 97.9 F (36.6 C) (11/09 2038) Temp Source: Oral (11/09 2038) BP: 148/71 (11/09 2038) Pulse Rate: 72 (11/09 2038) Intake/Output from previous day: 11/08 0701 - 11/09 0700 In: 4961.7 [P.O.:120; I.V.:4841.7] Out: 5700 [Urine:5700] Intake/Output from this shift: Total I/O In: -  Out: 400 [Urine:400]  Labs:  Recent Labs  03/20/16 0459 03/21/16 0455 03/22/16 0409  WBC 5.7 6.2 2.7*  HGB 10.2* 10.0* 9.5*  HCT 29.0* 28.4* 27.7*  PLT 142* 139* 128*  CREATININE 0.97 0.99 1.04*   Estimated Creatinine Clearance: 54.8 mL/min (by C-G formula based on SCr of 1.04 mg/dL (H)).   Microbiology: No results found for this or any previous visit (from the past 720 hour(s)).  Medical History: Past Medical History:  Diagnosis Date  . Arthritis   . Blood dyscrasia   . Cancer ([place])   . Chest pain, unspecified   . Diverticulosis 07/06/15  . Dysrhythmia   . Esophagitis 07/06/15  . Fatigue   . Gastritis 07/06/15  . GERD (gastroesophageal reflux disease)   . 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 Leg cramps   . Lymphoma ([place]) 09/14/15   Monoclonal B cell lymphoma  . Night sweats   . Osteoporosis   . Overweight(278.02)    Obesity  . Palpitations   . Shortness of breath dyspnea   . Tachycardia   . Thrombocytopenia (HCC)     Medications:  Scheduled:  . acyclovir  400 mg Oral BID  . apixaban  5 mg Oral BID  . famotidine  20 mg Oral BID  . leucovorin  10 mg/m2 Intravenous Q6H  . potassium chloride  40 mEq Oral Daily   Infusions:  . sodium chloride Stopped (03/19/16 2224)  .  sodium bicarbonate infusion 1/4 NS 1000 mL 125 mL/hr at 03/22/16 1530     Assessment: 60 yo female to start on high dose methotrexate infusion with Leucovorin rescue, hx large B-cell Lymphoma.  Baseline urine pH = 5.0 11/6 at 0937. MTX infusion started 11/6 at 1428.   Plan:  Do not start the methotrexate infusion until urine pH is at 7.0 and maintain > 7.0 during therapy; NaBicarb drip to continue at least 48h after MTX infusion. Per MD, check urine pH at least daily.   Sodium Bicarb Drip:  Goal Urine pH > 7.0 If urine pH less than goal consider increasing NaBicarb drip by 25 ml/hr, or if rate exceeds 175 ml/hr on the current NaBicarb drip, may need to increase bicarb in bag to 150 mEq. Checking urine pH every hour until pH >7.0 then once stabilizes and MTX infusion is complete consider checking every 2 hrs then every 4hrs etc . Care order instruction for RN to enter urine pH orders. Continue at least 48 hrs after Methotrexate infusion finished.  Methotrexate Levels:  Pt needs MTX level ordered at 24h after start of infusion (i.e. 20 h after the end of infusion b/c infusion runs over 4 hrs). --> level ordered for 11/7 at 1430. Based on MTX level, leucovorin dose will need to be ordered and adjusted based on the daily MTX level.  Pt will need daily MTX levels and urine pH monitoring until MTX levels are <0.1 microM. (Note: Labs are sent  to [facility] so takes 4-6 hrs to get result. Can find result by going under "Chart Review" then "Media" and look for "Lab result scan").  03/20/2016 1351 methotrexate level 2.41 mmol/L. Continue leucovorin as ordered.  03/21/2016 1430 MTX = 0.22 mmol/L. Continue leucovorin as ordered.   03/22/2016  1430 MTX = 0.1 mmol/L .  Continue leucovorin as ordered.   Leucovorin: Follow Infomation for High-dose methotrexate-rescue- Leucovorin dosing (see [email or web address]). Leucovorin to start at 24 h after start of methotrexate infusion. Ordered leucovorin 10 mg/m2 (=18 mg) IV q6h to start at 1430.   11/7 1500 urine pH 8.0, continue current  rate  11/7 1900 urine pH 9.0, continue current rate and recheck in 4 hours  11/7 2240 urine pH 9. Continue current rate and recheck in 4 hours.  11/8 0215 urine pH 9. Continue current rate and recheck in 4 hours.  11/8 0649 urine pH 9. Continue current rate and recheck in 4 hours.  11/8 1121 urine pH 9. Continue current rate and recheck in 4 hours.  11/9 1137 urine pH 9. Continue current rate and recheck in 4 hours. Last MTX level 0.22. Next level ordered for 1430 today.   Pharmacy will continue to follow.   Daysi Boggan D, Pharm.D. Clinical Pharmacist 03/22/2016 9:42 PM

## 2016-03-22 NOTE — Progress Notes (Signed)
MEDICATION RELATED CONSULT NOTE - Follow up  Pharmacy Consult for  High- Dose Methotrexate monitoring Indication: Monitoring of methotrexate levels and ordering leucovorin doses.   No Known Allergies  Patient Measurements: Height: 5' 2.5" (158.8 cm) Weight: 167 lb 6.4 oz (75.9 kg) IBW/kg (Calculated) : 51.25  Vital Signs: Temp: 98.5 F (36.9 C) (11/09 0456) Temp Source: Oral (11/09 0456) BP: 151/73 (11/09 0456) Pulse Rate: 59 (11/09 0456) Intake/Output from previous day: 11/08 0701 - 11/09 0700 In: 4961.7 [P.O.:120; I.V.:4841.7] Out: 5700 [Urine:5700] Intake/Output from this shift: No intake/output data recorded.  Labs:  Recent Labs  03/20/16 0459 03/21/16 0455 03/22/16 0409  WBC 5.7 6.2 2.7*  HGB 10.2* 10.0* 9.5*  HCT 29.0* 28.4* 27.7*  PLT 142* 139* 128*  CREATININE 0.97 0.99 1.04*   Estimated Creatinine Clearance: 54.8 mL/min (by C-G formula based on SCr of 1.04 mg/dL (H)).   Microbiology: No results found for this or any previous visit (from the past 720 hour(s)).  Medical History: Past Medical History:  Diagnosis Date  . Arthritis   . Blood dyscrasia   . Cancer ([place])   . Chest pain, unspecified   . Diverticulosis 07/06/15  . Dysrhythmia   . Esophagitis 07/06/15  . Fatigue   . Gastritis 07/06/15  . GERD (gastroesophageal reflux disease)   . 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 Leg cramps   . Lymphoma ([place]) 09/14/15   Monoclonal B cell lymphoma  . Night sweats   . Osteoporosis   . Overweight(278.02)    Obesity  . Palpitations   . Shortness of breath dyspnea   . Tachycardia   . Thrombocytopenia (HCC)     Medications:  Scheduled:  . acyclovir  400 mg Oral BID  . apixaban  5 mg Oral BID  . famotidine  20 mg Oral BID  . leucovorin  10 mg/m2 Intravenous Q6H  . potassium chloride  40 mEq Oral Daily   Infusions:  . sodium chloride Stopped (03/19/16 2224)  .  sodium bicarbonate infusion 1/4 NS 1000 mL 125 mL/hr at 03/22/16 0557     Assessment: 60 yo female to start on high dose methotrexate infusion with Leucovorin rescue, hx large B-cell Lymphoma.  Baseline urine pH = 5.0 11/6 at 0937. MTX infusion started 11/6 at 1428.   Plan:  Do not start the methotrexate infusion until urine pH is at 7.0 and maintain > 7.0 during therapy; NaBicarb drip to continue at least 48h after MTX infusion. Per MD, check urine pH at least daily.   Sodium Bicarb Drip:  Goal Urine pH > 7.0 If urine pH less than goal consider increasing NaBicarb drip by 25 ml/hr, or if rate exceeds 175 ml/hr on the current NaBicarb drip, may need to increase bicarb in bag to 150 mEq. Checking urine pH every hour until pH >7.0 then once stabilizes and MTX infusion is complete consider checking every 2 hrs then every 4hrs etc . Care order instruction for RN to enter urine pH orders. Continue at least 48 hrs after Methotrexate infusion finished.  Methotrexate Levels:  Pt needs MTX level ordered at 24h after start of infusion (i.e. 20 h after the end of infusion b/c infusion runs over 4 hrs). --> level ordered for 11/7 at 1430. Based on MTX level, leucovorin dose will need to be ordered and adjusted based on the daily MTX level.  Pt will need daily MTX levels and urine pH monitoring until MTX levels are <0.1 microM. (Note: Labs are sent to [facility] so takes  4-6 hrs to get result. Can find result by going under "Chart Review" then "Media" and look for "Lab result scan").  03/20/2016 1351 methotrexate level 2.41 mmol/L. Continue leucovorin as ordered.  03/21/2016 1430 MTX = 0.22 mmol/L. Continue leucovorin as ordered.   Leucovorin: Follow Infomation for High-dose methotrexate-rescue- Leucovorin dosing (see [email or web address]). Leucovorin to start at 24 h after start of methotrexate infusion. Ordered leucovorin 10 mg/m2 (=18 mg) IV q6h to start at 1430.   11/7 1500 urine pH 8.0, continue current rate  11/7 1900 urine pH 9.0, continue current rate and recheck in 4  hours  11/7 2240 urine pH 9. Continue current rate and recheck in 4 hours.  11/8 0215 urine pH 9. Continue current rate and recheck in 4 hours.  11/8 0649 urine pH 9. Continue current rate and recheck in 4 hours.  11/8 1121 urine pH 9. Continue current rate and recheck in 4 hours.  11/9 1137 urine pH 9. Continue current rate and recheck in 4 hours. Last MTX level 0.22. Next level ordered for 1430 today.   Pharmacy will continue to follow.   Kaelani Kendrick M Janea Schwenn, Pharm.D. Clinical Pharmacist 03/22/2016 12:45 PM

## 2016-03-23 LAB — CBC WITH DIFFERENTIAL/PLATELET
BASOS ABS: 0 10*3/uL (ref 0–0.1)
Basophils Relative: 1 %
EOS ABS: 0.2 10*3/uL (ref 0–0.7)
EOS PCT: 8 %
HCT: 27.1 % — ABNORMAL LOW (ref 35.0–47.0)
HEMOGLOBIN: 9.3 g/dL — AB (ref 12.0–16.0)
LYMPHS PCT: 23 %
Lymphs Abs: 0.7 10*3/uL — ABNORMAL LOW (ref 1.0–3.6)
MCH: 30.8 pg (ref 26.0–34.0)
MCHC: 34.4 g/dL (ref 32.0–36.0)
MCV: 89.4 fL (ref 80.0–100.0)
Monocytes Absolute: 0 10*3/uL — ABNORMAL LOW (ref 0.2–0.9)
Monocytes Relative: 2 %
NEUTROS PCT: 68 %
Neutro Abs: 2 10*3/uL (ref 1.4–6.5)
PLATELETS: 125 10*3/uL — AB (ref 150–440)
RBC: 3.04 MIL/uL — AB (ref 3.80–5.20)
RDW: 13.2 % (ref 11.5–14.5)
WBC: 2.9 10*3/uL — AB (ref 3.6–11.0)

## 2016-03-23 LAB — BASIC METABOLIC PANEL
Anion gap: 5 (ref 5–15)
BUN: 12 mg/dL (ref 6–20)
CO2: 34 mmol/L — ABNORMAL HIGH (ref 22–32)
CREATININE: 1.02 mg/dL — AB (ref 0.44–1.00)
Calcium: 8.6 mg/dL — ABNORMAL LOW (ref 8.9–10.3)
Chloride: 103 mmol/L (ref 101–111)
GFR, EST NON AFRICAN AMERICAN: 58 mL/min — AB (ref >60)
Glucose, Bld: 85 mg/dL (ref 65–99)
POTASSIUM: 3.6 mmol/L (ref 3.5–5.1)
SODIUM: 142 mmol/L (ref 135–145)

## 2016-03-23 LAB — URINE PH
PH: 9 — AB (ref 5.0–8.0)
PH: 9 — AB (ref 5.0–8.0)
PH: 9 — AB (ref 5.0–8.0)
pH: 9 — ABNORMAL HIGH (ref 5.0–8.0)

## 2016-03-23 MED ORDER — SODIUM CHLORIDE 0.9% FLUSH
10.0000 mL | INTRAVENOUS | Status: AC | PRN
  Administered 2016-03-23: 17:00:00 10 mL

## 2016-03-23 MED ORDER — HEPARIN SOD (PORK) LOCK FLUSH 100 UNIT/ML IV SOLN
500.0000 [IU] | INTRAVENOUS | Status: AC | PRN
  Administered 2016-03-23: 500 [IU]
  Filled 2016-03-23: qty 5

## 2016-03-23 NOTE — Discharge Summary (Signed)
[place] [facility] [place] NAME: Stacy Murillo    MR#:  [id number]  DATE OF BIRTH:  September 06, 1955  DATE OF ADMISSION:  03/19/2016 ADMITTING PHYSICIAN: Cammie Sickle, MD  DATE OF DISCHARGE: No discharge date for patient encounter.  PRIMARY CARE PHYSICIAN: Rica Mast, MD    ADMISSION DIAGNOSIS:  high dose methotrexate  DISCHARGE DIAGNOSIS:  Active Problems:   Lymphoma, large-cell, diffuse (HCC)   SECONDARY DIAGNOSIS:   Past Medical History:  Diagnosis Date  . Arthritis   . Blood dyscrasia   . Cancer ([place])   . Chest pain, unspecified   . Diverticulosis 07/06/15  . Dysrhythmia   . Esophagitis 07/06/15  . Fatigue   . Gastritis 07/06/15  . GERD (gastroesophageal reflux disease)   . 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 Leg cramps   . Lymphoma ([place]) 09/14/15   Monoclonal B cell lymphoma  . Night sweats   . Osteoporosis   . Overweight(278.02)    Obesity  . Palpitations   . Shortness of breath dyspnea   . Tachycardia   . Thrombocytopenia [facility])     HOSPITAL COURSE:   60 year old female patient with a history of diffuse large B-cell lymphoma - was admitted to the hospital for cycle #4 of high-dose methotrexate; followed by leucovorin rescue as per protocol. Patient was on bicarbonate drip; also needed potassium supplementation for hypokalemia. Tolerated chemotherapy well- except for mild pancytopenia.  72 hours post methotrexate infusion- methotrexate level 0.1 ; awaiting levels in the evening today . Patient to be discharged if levels less than 0.1. On call physician Dr. Mike Gip will be informed of the plan. The   CONSULTS OBTAINED:    DRUG ALLERGIES:  No Known Allergies  DISCHARGE MEDICATIONS:   Current Discharge Medication List    CONTINUE these medications which have NOT CHANGED   Details  acyclovir (ZOVIRAX) 400 MG tablet Take 400 mg by mouth 2 (two) times daily.    apixaban (ELIQUIS) 5 MG TABS tablet Take  1 tablet (5 mg total) by mouth 2 (two) times daily. From 10/27/15 Qty: 60 tablet, Refills: 3   Associated Diagnoses: 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[place]); History of DVT (deep vein thrombosis)    ranitidine (ZANTAC) 150 MG tablet Take 150 mg by mouth 2 (two) times daily.   Associated Diagnoses: 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)    senna-docusate (SENOKOT-S) 8.6-50 MG tablet Take 1 tablet by mouth at bedtime as needed for mild constipation. Qty: 30 tablet, Refills: 2   Associated Diagnoses: Lymphoma, large cell, intra-abdominal lymph nodes ([place]); Lymphoma, large-cell, diffuse ([place])         DISCHARGE INSTRUCTIONS:   # Patient will be given follow-up blood work to be done in middle of next week; patient follow-up with me is planned in 2 weeks.  DIET:  Regular diet  DISCHARGE CONDITION:  Good  ACTIVITY:  Activity as tolerated  OXYGEN:  Home Oxygen: No.   Oxygen Delivery: room air  DISCHARGE LOCATION:  home   If you experience worsening of your admission symptoms, develop shortness of breath, life threatening emergency, suicidal or homicidal thoughts you must seek medical attention immediately by calling 911 or calling your MD immediately  if symptoms less severe.  You Must read complete instructions/literature along with all the possible adverse reactions/side effects for all the Medicines you take and that have been prescribed to you. Take any new Medicines after you have completely understood and accpet  all the possible adverse reactions/side effects.   Please note  You were cared for by a hospitalist during your hospital stay. If you have any questions about your discharge medications or the care you received while you were in the hospital after you are discharged, you can call the unit and asked to speak with the hospitalist on call if the hospitalist that took care of you is not available. Once you are discharged, your primary care  physician will handle any further medical issues. Please note that NO REFILLS for any discharge medications will be authorized once you are discharged, as it is imperative that you return to your primary care physician (or establish a relationship with a primary care physician if you do not have one) for your aftercare needs so that they can reassess your need for medications and monitor your lab values.    On the day of Discharge:  VITAL SIGNS:  Blood pressure (!) 150/66, pulse 64, temperature 98 F (36.7 C), temperature source Oral, resp. rate 20, height 5' 2.5" (1.588 m), weight 167 lb 6.4 oz (75.9 kg), SpO2 97 %.  PHYSICAL EXAMINATION:  GENERAL:  60 y.o.-year-old patient lying in the bed with no acute distress.  EYES: Pupils equal, round, reactive to light and accommodation. No scleral icterus. Extraocular muscles intact.  HEENT: Head atraumatic, normocephalic. Oropharynx and nasopharynx clear.  NECK:  Supple, no jugular venous distention. No thyroid enlargement, no tenderness.  LUNGS: Normal breath sounds bilaterally, no wheezing, rales,rhonchi or crepitation. No use of accessory muscles of respiration.  CARDIOVASCULAR: S1, S2 normal. No murmurs, rubs, or gallops.  ABDOMEN: Soft, non-tender, non-distended. Bowel sounds present. No organomegaly or mass.  EXTREMITIES: No pedal edema, cyanosis, or clubbing.  NEUROLOGIC: Cranial nerves II through XII are intact. Muscle strength 5/5 in all extremities. Sensation intact. Gait not checked.  PSYCHIATRIC: The patient is alert and oriented x 3.  SKIN: No obvious rash, lesion, or ulcer.  DATA REVIEW:   CBC  Recent Labs Lab 03/23/16 0459  WBC 2.9*  HGB 9.3*  HCT 27.1*  PLT 125*    Chemistries   Recent Labs Lab 03/19/16 0829  03/23/16 0459  NA 141  < > 142  K 3.7  < > 3.6  CL 106  < > 103  CO2 26  < > 34*  GLUCOSE 114*  < > 85  BUN 14  < > 12  CREATININE 0.89  < > 1.02*  CALCIUM 9.3  < > 8.6*  AST 30  --   --   ALT 21  --    --   ALKPHOS 64  --   --   BILITOT 0.7  --   --   < > = values in this interval not displayed.  Cardiac Enzymes No results for input(s): TROPONINI in the last 168 hours.  Microbiology Results  Results for orders placed or performed during the hospital encounter of 10/19/15  Urine culture     Status: Abnormal   Collection Time: 10/19/15 11:32 AM  Result Value Ref Range Status   Specimen Description URINE, RANDOM  Final   Special Requests NONE  Final   Culture MULTIPLE SPECIES PRESENT, SUGGEST RECOLLECTION (A)  Final   Report Status 10/20/2015 FINAL  Final  Blood culture (routine x 2)     Status: None   Collection Time: 10/19/15 11:32 AM  Result Value Ref Range Status   Specimen Description BLOOD RIGHT AC  Final   Special Requests BOTTLES DRAWN AEROBIC AND  ANAEROBIC 14ML  Final   Culture NO GROWTH 5 DAYS  Final   Report Status 10/24/2015 FINAL  Final  Blood culture (routine x 2)     Status: None   Collection Time: 10/19/15 11:32 AM  Result Value Ref Range Status   Specimen Description BLOOD LEFT AC  Final   Special Requests   Final    BOTTLES DRAWN AEROBIC AND ANAEROBIC AER 15ML ANA 13ML   Culture NO GROWTH 5 DAYS  Final   Report Status 10/24/2015 FINAL  Final    RADIOLOGY:  No results found.   Management plans discussed with the patient, family and they are in agreement.  CODE STATUS:     Code Status Orders        Start     Ordered   03/19/16 0931  Full code  Continuous     03/19/16 0930    Code Status History    Date Active Date Inactive Code Status Order ID Comments User Context   02/27/2016 11:54 AM 03/03/2016  5:03 PM Full Code [id number]  Cammie Sickle, MD Inpatient   01/23/2016 10:53 AM 01/28/2016  2:16 PM Full Code [id number]  Cammie Sickle, MD Inpatient   12/05/2015 12:23 PM 12/10/2015  7:37 PM Full Code [id number]  Cammie Sickle, MD Inpatient   10/19/2015  9:33 PM 10/20/2015  4:54 PM Full Code [id number]  Fritzi Mandes, MD Inpatient    Advance  Directive Documentation   [place] Most Recent Value  Type of Advance Directive  Living will  Pre-existing out of facility DNR order (yellow form or pink MOST form)  No data  "MOST" Form in Place?  No data      TOTAL TIME TAKING CARE OF THIS PATIENT: 35 minutes.    Cammie Sickle M.D on 03/23/2016 at 8:33 AM   CC: Primary care physician; Rica Mast, MD   Note: This dictation was prepared with Dragon dictation along with smaller phrase technology. Any transcriptional errors that result from this process are unintentional.

## 2016-03-23 NOTE — Discharge Instructions (Signed)
Chemotherapy  Chemotherapy is the use of medicines to stop or slow the growth of cancer cells. Depending on the type and stage of your cancer, you may have chemotherapy to:   Cure your cancer.   Slow the progression of your cancer.   Ease your cancer symptoms.   Improve the benefits of radiation treatment.   Shrink a tumor before surgery.   Rid the body of cancer cells that remain after a tumor is surgically removed.  HOW IS CHEMOTHERAPY GIVEN?  Chemotherapy may be given:   By mouth in liquid or pill form.   Through a thin tube that is inserted into a vein or artery.   By getting a shot.   By rubbing a cream or ointment on your skin.   Through liquids that are placed directly into various areas of the body, such as the abdomen, chest, or bladder.  HOW OFTEN IS CHEMOTHERAPY GIVEN?  Chemotherapy may be given continuously over time, or it may be given in cycles. For example, you may take the medicine for one week out of every month.  FOR HOW LONG WILL I NEED CHEMOTHERAPY TREATMENTS?  The length of treatment depends on many factors, including:   The type of cancer.   Whether the cancer has spread.   How you respond to the chemotherapy.   Whether you develop side effects.  Some types of chemotherapy medicine are given only one time. Others are given for months, years, or for life.  WHAT SAFETY PRECAUTIONS MUST I TAKE WHILE ON CHEMOTHERAPY?  Chemotherapy medicines are very strong. They will be in all of your bodily fluids, including your urine, stool, saliva, sweat, tears, vaginal secretions, and semen. You must carefully follow some safety precautions to prevent harm to others while you are using these medicines. Here are some recommended precautions:   Make sure that people who help care for you wear disposable gloves if they are going to come into contact with any of your bodily fluids. Women who are pregnant or breastfeeding should not handle any of your bodily fluids.   Wash any clothes, towels, and  linens that may have your bodily fluids on them twice in a washing machine using very hot water.   Dispose of adult diapers, tampons, and sanitary napkins by first sealing them in a plastic bag.   Use a condom when having sex for at least 2 weeks after receiving your chemotherapy.   Do not share beverages or food.   Keep your chemotherapy medicines in their original bottles. Keep them in a high, safe location, away from children. Do not expose them to heat or moisture. Do not put them in containers with other types of medicines.   Dispose of all wrappers for your chemotherapy medicines by sealing them in a separate plastic bag.   Do not throw away extra medicine, and do not flush it down the toilet. Take medicine that you are not going to use to your health care provider's office where it can be disposed of properly.   Follow your health care provider's directions for the proper disposal of needles, IV tubing, and other medical supplies that have come into contact with your chemotherapy medicines.   If you are issued a hazardous waste container, make sure you understand the directions for using it.   Wash your hands thoroughly with warm water and soap after using the bathroom. Dry your hands with disposable paper towels.   When using the toilet:    Flush it   twice after each use, including after vomiting.    Close the lid of the toilet prior to flushing. This helps to avoid splashing.    Both men and women should sit to use the toilet. This helps avoid splashing.  WHAT ARE THE SIDE EFFECTS OF CHEMOTHERAPY?  Side effects depend on a variety of factors, including:   The specific type of chemotherapy medicine used.   The dosage.   How long the medicine is used for.   Your overall health.  Some of the side effects you may experience include:   Fatigue and decreased energy.   Decreased appetite.   Changes in your sense of smell or taste.   Nausea.   Vomiting.   Constipation or diarrhea.   Hair  loss.   Increased susceptibility to infection.   Easy bleeding.   Mouth sores.   Burning or tingling in the hands or feet.   Memory problems.     This information is not intended to replace advice given to you by your health care provider. Make sure you discuss any questions you have with your health care provider.     Document Released: 02/25/2007 Document Revised: 05/21/2014 Document Reviewed: 10/06/2013  Elsevier Interactive Patient Education 2016 [employer]

## 2016-03-23 NOTE — Progress Notes (Signed)
MEDICATION RELATED CONSULT NOTE - Follow up  Pharmacy Consult for  High- Dose Methotrexate monitoring Indication: Monitoring of methotrexate levels and ordering leucovorin doses.   No Known Allergies  Patient Measurements: Height: 5' 2.5" (158.8 cm) Weight: 167 lb 6.4 oz (75.9 kg) IBW/kg (Calculated) : 51.25  Vital Signs: Temp: 98 F (36.7 C) (11/10 0843) Temp Source: Oral (11/10 0843) BP: 158/78 (11/10 0843) Pulse Rate: 61 (11/10 0843) Intake/Output from previous day: 11/09 0701 - 11/10 0700 In: 5488.8 [I.V.:5488.8] Out: 3650 [Urine:3650] Intake/Output from this shift: No intake/output data recorded.  Labs:  Recent Labs  03/21/16 0455 03/22/16 0409 03/23/16 0459  WBC 6.2 2.7* 2.9*  HGB 10.0* 9.5* 9.3*  HCT 28.4* 27.7* 27.1*  PLT 139* 128* 125*  CREATININE 0.99 1.04* 1.02*   Estimated Creatinine Clearance: 55.9 mL/min (by C-G formula based on SCr of 1.02 mg/dL (H)).   Microbiology: No results found for this or any previous visit (from the past 720 hour(s)).  Medical History: Past Medical History:  Diagnosis Date  . Arthritis   . Blood dyscrasia   . Cancer ([place])   . Chest pain, unspecified   . Diverticulosis 07/06/15  . Dysrhythmia   . Esophagitis 07/06/15  . Fatigue   . Gastritis 07/06/15  . GERD (gastroesophageal reflux disease)   . 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 Leg cramps   . Lymphoma ([place]) 09/14/15   Monoclonal B cell lymphoma  . Night sweats   . Osteoporosis   . Overweight(278.02)    Obesity  . Palpitations   . Shortness of breath dyspnea   . Tachycardia   . Thrombocytopenia (HCC)     Medications:  Scheduled:  . acyclovir  400 mg Oral BID  . apixaban  5 mg Oral BID  . famotidine  20 mg Oral BID  . leucovorin  10 mg/m2 Intravenous Q6H  . potassium chloride  40 mEq Oral Daily   Infusions:  . sodium chloride Stopped (03/19/16 2224)  .  sodium bicarbonate infusion 1/4 NS 1000 mL 125 mL/hr at 03/23/16 0741    Assessment: 60 yo female  to start on high dose methotrexate infusion with Leucovorin rescue, hx large B-cell Lymphoma.  Baseline urine pH = 5.0 11/6 at 0937. MTX infusion started 11/6 at 1428.   Plan:  Do not start the methotrexate infusion until urine pH is at 7.0 and maintain > 7.0 during therapy; NaBicarb drip to continue at least 48h after MTX infusion. Per MD, check urine pH at least daily.   Sodium Bicarb Drip:  Goal Urine pH > 7.0 If urine pH less than goal consider increasing NaBicarb drip by 25 ml/hr, or if rate exceeds 175 ml/hr on the current NaBicarb drip, may need to increase bicarb in bag to 150 mEq. Checking urine pH every hour until pH >7.0 then once stabilizes and MTX infusion is complete consider checking every 2 hrs then every 4hrs etc . Care order instruction for RN to enter urine pH orders. Continue at least 48 hrs after Methotrexate infusion finished.  Methotrexate Levels:  Pt needs MTX level ordered at 24h after start of infusion (i.e. 20 h after the end of infusion b/c infusion runs over 4 hrs). --> level ordered for 11/7 at 1430. Based on MTX level, leucovorin dose will need to be ordered and adjusted based on the daily MTX level.  Pt will need daily MTX levels and urine pH monitoring until MTX levels are <0.1 microM. (Note: Labs are sent to [facility] so takes 4-6  hrs to get result. Can find result by going under "Chart Review" then "Media" and look for "Lab result scan").  03/20/2016 1351 methotrexate level 2.41 mmol/L. Continue leucovorin as ordered.  03/21/2016 1430 MTX = 0.22 mmol/L. Continue leucovorin as ordered.   03/22/2016  1430 MTX = 0.1 mmol/L .  Continue leucovorin as ordered.   Leucovorin: Follow Infomation for High-dose methotrexate-rescue- Leucovorin dosing (see [email or web address]). Leucovorin to start at 24 h after start of methotrexate infusion. Ordered leucovorin 10 mg/m2 (=18 mg) IV q6h to start at 1430.   11/7 1500 urine pH 8.0, continue current rate  11/7 1900 urine pH 9.0,  continue current rate and recheck in 4 hours  11/7 2240 urine pH 9. Continue current rate and recheck in 4 hours.  11/8 0215 urine pH 9. Continue current rate and recheck in 4 hours.  11/8 0649 urine pH 9. Continue current rate and recheck in 4 hours.  11/8 1121 urine pH 9. Continue current rate and recheck in 4 hours.  11/9 1137 urine pH 9. Continue current rate and recheck in 4 hours.  11/10 1109 urine pH 9. Continue current rate and recheck in 4 hours.  Next MTX level ordered for 1430 today.   Pharmacy will continue to follow.   Avie Checo M Wenona [place], Pharm.D. Clinical Pharmacist 03/23/2016 11:39 AM

## 2016-03-23 NOTE — Progress Notes (Signed)
Pt and family given d/c instructions r/t followup care and medications, voiced understanding, pt d/c home with family per orders

## 2016-03-23 NOTE — Progress Notes (Signed)
Stacy Murillo   DOB:02/17/1956   [id number]    Subjective: No nausea no vomiting. No sores in the mouth. No fever no chills. Eager to go home.   Objective:  Vitals:   03/22/16 2038 03/23/16 0445  BP: (!) 148/71 (!) 150/66  Pulse: 72 64  Resp: 20 20  Temp: 97.9 F (36.6 C) 98 F (36.7 C)     Intake/Output Summary (Last 24 hours) at 03/23/16 0743 Last data filed at 03/23/16 0655  Gross per 24 hour  Intake          5488.75 ml  Output             3250 ml  Net          2238.75 ml    GENERAL Alert, no distress and comfortable. She is alone.  EYES: no pallor or icterus OROPHARYNX: no thrush or ulceration. NECK: supple, no masses felt LYMPH:  no palpable lymphadenopathy in the cervical, axillary or inguinal regions LUNGS: decreased breath sounds to auscultation at bases and  No wheeze or crackles HEART/CVS: regular rate & rhythm and no murmurs; No lower extremity edema ABDOMEN: abdomen soft, tender  on deep palpation. and normal bowel sounds Musculoskeletal:no cyanosis of digits and no clubbing  PSYCH: alert & oriented x 3 with fluent speech NEURO: no focal motor/sensory deficits SKIN:  no rashes or significant lesions   Labs:  Lab Results  Component Value Date   WBC 2.9 (L) 03/23/2016   HGB 9.3 (L) 03/23/2016   HCT 27.1 (L) 03/23/2016   MCV 89.4 03/23/2016   PLT 125 (L) 03/23/2016   NEUTROABS 2.0 03/23/2016    Lab Results  Component Value Date   NA 142 03/23/2016   K 3.6 03/23/2016   CL 103 03/23/2016   CO2 34 (H) 03/23/2016    Studies:  No results found.  Assessment & Plan:  # 60 year old female patient with a history of diffuse large B-cell lymphoma/involving peripheral blood status post 6cycles of R CHOP currently admitted to the hospital for high-dose methotrexate cycle #4; day #4 today.    # Intracranial prophylaxis-s/p #4 high-dose methotrexate- 24 hours 2.41; 48 hours- 0.22; 36 hours- 0.1. Continue to adjust leucovorin based on Mxt levels. Appreciate  pharmacy monitoring. Goal MXT level- 0.1- 0.05.   # Creatinine- today- 1.02 ; will monitor closely; hypokalemia-3.6 on K dur 40 BID.   # Mild pancytopenia- 2.9/ANC- 2.0; hb 9.3/ platelets- 125. . IF THE ANC LESS THAN THOUSAND RECOMMEND NEUPOGEN.  # Diffuse large B-cell lymphoma status post 6 cycles of R CHOP- PET scan shows complete response.  # I reviewed the blood work- with the patient in detail; wait for MXT tonite; if less than 0.1- patient could be discharged home.    Cammie Sickle, MD 03/23/2016  7:43 AM

## 2016-03-27 ENCOUNTER — Other Ambulatory Visit: Payer: Self-pay | Admitting: *Deleted

## 2016-03-27 DIAGNOSIS — C8350 Lymphoblastic (diffuse) lymphoma, unspecified site: Secondary | ICD-10-CM

## 2016-03-28 ENCOUNTER — Inpatient Hospital Stay: Payer: BLUE CROSS/BLUE SHIELD

## 2016-03-28 DIAGNOSIS — Z86718 Personal history of other venous thrombosis and embolism: Secondary | ICD-10-CM | POA: Diagnosis not present

## 2016-03-28 DIAGNOSIS — M1990 Unspecified osteoarthritis, unspecified site: Secondary | ICD-10-CM | POA: Diagnosis not present

## 2016-03-28 DIAGNOSIS — Z7901 Long term (current) use of anticoagulants: Secondary | ICD-10-CM | POA: Diagnosis not present

## 2016-03-28 DIAGNOSIS — D696 Thrombocytopenia, unspecified: Secondary | ICD-10-CM | POA: Diagnosis not present

## 2016-03-28 DIAGNOSIS — Z79899 Other long term (current) drug therapy: Secondary | ICD-10-CM | POA: Diagnosis not present

## 2016-03-28 DIAGNOSIS — K219 Gastro-esophageal reflux disease without esophagitis: Secondary | ICD-10-CM | POA: Diagnosis not present

## 2016-03-28 DIAGNOSIS — E669 Obesity, unspecified: Secondary | ICD-10-CM | POA: Diagnosis not present

## 2016-03-28 DIAGNOSIS — C8350 Lymphoblastic (diffuse) lymphoma, unspecified site: Secondary | ICD-10-CM

## 2016-03-28 DIAGNOSIS — C8333 Diffuse large B-cell lymphoma, intra-abdominal lymph nodes: Secondary | ICD-10-CM | POA: Diagnosis present

## 2016-03-28 LAB — CBC WITH DIFFERENTIAL/PLATELET
BASOS ABS: 0 10*3/uL (ref 0–0.1)
Basophils Relative: 0 %
Eosinophils Absolute: 0.2 10*3/uL (ref 0–0.7)
Eosinophils Relative: 4 %
HEMATOCRIT: 32.2 % — AB (ref 35.0–47.0)
Hemoglobin: 11.1 g/dL — ABNORMAL LOW (ref 12.0–16.0)
LYMPHS ABS: 0.8 10*3/uL — AB (ref 1.0–3.6)
LYMPHS PCT: 15 %
MCH: 30.9 pg (ref 26.0–34.0)
MCHC: 34.4 g/dL (ref 32.0–36.0)
MCV: 90 fL (ref 80.0–100.0)
Monocytes Absolute: 0.6 10*3/uL (ref 0.2–0.9)
Monocytes Relative: 11 %
NEUTROS ABS: 3.8 10*3/uL (ref 1.4–6.5)
Neutrophils Relative %: 70 %
Platelets: 119 10*3/uL — ABNORMAL LOW (ref 150–440)
RBC: 3.58 MIL/uL — AB (ref 3.80–5.20)
RDW: 13.1 % (ref 11.5–14.5)
WBC: 5.4 10*3/uL (ref 3.6–11.0)

## 2016-03-28 LAB — BASIC METABOLIC PANEL
ANION GAP: 6 (ref 5–15)
BUN: 14 mg/dL (ref 6–20)
CHLORIDE: 108 mmol/L (ref 101–111)
CO2: 27 mmol/L (ref 22–32)
Calcium: 9.4 mg/dL (ref 8.9–10.3)
Creatinine, Ser: 1.07 mg/dL — ABNORMAL HIGH (ref 0.44–1.00)
GFR calc Af Amer: >60 mL/min (ref >60)
GFR, EST NON AFRICAN AMERICAN: 55 mL/min — AB (ref >60)
GLUCOSE: 99 mg/dL (ref 65–99)
POTASSIUM: 4.5 mmol/L (ref 3.5–5.1)
Sodium: 141 mmol/L (ref 135–145)

## 2016-04-02 ENCOUNTER — Inpatient Hospital Stay (HOSPITAL_BASED_OUTPATIENT_CLINIC_OR_DEPARTMENT_OTHER): Payer: BLUE CROSS/BLUE SHIELD | Admitting: Internal Medicine

## 2016-04-02 ENCOUNTER — Inpatient Hospital Stay: Payer: BLUE CROSS/BLUE SHIELD

## 2016-04-02 VITALS — BP 138/84 | HR 75 | Temp 97.2°F | Resp 18 | Wt 166.8 lb

## 2016-04-02 DIAGNOSIS — Z7901 Long term (current) use of anticoagulants: Secondary | ICD-10-CM

## 2016-04-02 DIAGNOSIS — Z86718 Personal history of other venous thrombosis and embolism: Secondary | ICD-10-CM

## 2016-04-02 DIAGNOSIS — Z79899 Other long term (current) drug therapy: Secondary | ICD-10-CM | POA: Diagnosis not present

## 2016-04-02 DIAGNOSIS — C8583 Other specified types of non-Hodgkin lymphoma, intra-abdominal lymph nodes: Secondary | ICD-10-CM

## 2016-04-02 DIAGNOSIS — C8333 Diffuse large B-cell lymphoma, intra-abdominal lymph nodes: Secondary | ICD-10-CM | POA: Diagnosis not present

## 2016-04-02 LAB — COMPREHENSIVE METABOLIC PANEL
ALT: 19 U/L (ref 14–54)
AST: 24 U/L (ref 15–41)
Albumin: 4.5 g/dL (ref 3.5–5.0)
Alkaline Phosphatase: 61 U/L (ref 38–126)
Anion gap: 3 — ABNORMAL LOW (ref 5–15)
BILIRUBIN TOTAL: 0.8 mg/dL (ref 0.3–1.2)
BUN: 8 mg/dL (ref 6–20)
CHLORIDE: 108 mmol/L (ref 101–111)
CO2: 29 mmol/L (ref 22–32)
CREATININE: 0.96 mg/dL (ref 0.44–1.00)
Calcium: 9.5 mg/dL (ref 8.9–10.3)
Glucose, Bld: 102 mg/dL — ABNORMAL HIGH (ref 65–99)
POTASSIUM: 4.4 mmol/L (ref 3.5–5.1)
Sodium: 140 mmol/L (ref 135–145)
TOTAL PROTEIN: 6.5 g/dL (ref 6.5–8.1)

## 2016-04-02 LAB — CBC WITH DIFFERENTIAL/PLATELET
Basophils Absolute: 0 10*3/uL (ref 0–0.1)
Basophils Relative: 1 %
EOS PCT: 2 %
Eosinophils Absolute: 0.1 10*3/uL (ref 0–0.7)
HCT: 33.9 % — ABNORMAL LOW (ref 35.0–47.0)
Hemoglobin: 11.7 g/dL — ABNORMAL LOW (ref 12.0–16.0)
LYMPHS ABS: 0.8 10*3/uL — AB (ref 1.0–3.6)
LYMPHS PCT: 16 %
MCH: 31 pg (ref 26.0–34.0)
MCHC: 34.5 g/dL (ref 32.0–36.0)
MCV: 89.8 fL (ref 80.0–100.0)
MONO ABS: 0.5 10*3/uL (ref 0.2–0.9)
MONOS PCT: 9 %
Neutro Abs: 3.8 10*3/uL (ref 1.4–6.5)
Neutrophils Relative %: 72 %
PLATELETS: 161 10*3/uL (ref 150–440)
RBC: 3.77 MIL/uL — ABNORMAL LOW (ref 3.80–5.20)
RDW: 13.7 % (ref 11.5–14.5)
WBC: 5.1 10*3/uL (ref 3.6–11.0)

## 2016-04-02 NOTE — Progress Notes (Signed)
[place] OFFICE PROGRESS NOTE  Patient Care Team: Jackolyn Confer, MD as PCP - General (Internal Medicine) Cammie Sickle, MD as Consulting Physician (Internal Medicine) Seeplaputhur Robinette Haines, MD (General Surgery) [name], MD as Consulting Physician (Cardiology)  Large cell lymphoma of intra-abdominal lymph nodes [facility])   Staging form: Lymphoid Neoplasms, AJCC 6th Edition     Clinical stage from 09/13/2015: Stage IV - Signed by Cammie Sickle, MD on 10/06/2015    Oncology History   #MAY 2017- LARGE B CELL LYMPHOMA with intravascular features STAGE IV- [BMBx- hypercellular- lymphoproliferative process is mostly seen within small vessels in the bone marrow as well as the surrounding interstitium associated with circulating lymphoma cells in the peripheral blood; ]. CT- 1-2 CM LN subpectoral/medistinal/ retro-peritoneal/pelvic/ Right inguinal LN 1.6cm- Bx- DLBCL- ABC; myc-POS; FISH gene re-arragement-NEG. PET- MULTIPLE LN/ Bone involvement; R-CHOP x6- CR'# OCT 2017- PET scan- CR  # Lumbar puncture- difficult-spinal headache. S/p high dose MXT x4.   # June 2017-Elevated LFTs- valacylovir/diflucan; resolved.   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   # May 2017- EF- 55-65%      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)   09/21/2015 Initial Diagnosis    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)      INTERVAL HISTORY:  A pleasant 60 year old Caucasian female patient with above history of diffuse large B-cell lymphoma stage IV currently on R CHOP chemotherapy status post cycle 6- Complete response noted on the PET scan is currently status post 4 cycles of high-dose methotrexate is here for follow-up. Last cycle was approximately 14 days ago.  Patient appetite is good. No weight loss. No cough or shortness of breath. No chest pain. No cramps in the legs. No tingling or numbness.  REVIEW OF SYSTEMS:  A complete 10 point  review of system is done which is negative except mentioned above/history of present illness.   PAST MEDICAL HISTORY :  Past Medical History:  Diagnosis Date  . Arthritis   . Blood dyscrasia   . Cancer ([place])   . Chest pain, unspecified   . Diverticulosis 07/06/15  . Dysrhythmia   . Esophagitis 07/06/15  . Fatigue   . Gastritis 07/06/15  . GERD (gastroesophageal reflux disease)   . 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 Leg cramps   . Lymphoma ([place]) 09/14/15   Monoclonal B cell lymphoma  . Night sweats   . Osteoporosis   . Overweight(278.02)    Obesity  . Palpitations   . Shortness of breath dyspnea   . Tachycardia   . Thrombocytopenia ([place])     PAST SURGICAL HISTORY :   Past Surgical History:  Procedure Laterality Date  . ABDOMINAL HYSTERECTOMY  2005  . BONE MARROW BIOPSY  09/14/15  . CHOLECYSTECTOMY  1997  . COLONOSCOPY  07/05/2014  . ESOPHAGOGASTRODUODENOSCOPY  07/05/2014  . INGUINAL LYMPH NODE BIOPSY Right 10/05/2015   Procedure: INGUINAL LYMPH NODE BIOPSY;  Surgeon: Christene Lye, MD;  Location: [facility] ORS;  Service: General;  Laterality: Right;  . PERIPHERAL VASCULAR CATHETERIZATION N/A 09/27/2015   Procedure: Glori Luis Cath Insertion;  Surgeon: Algernon Huxley, MD;  Location: [place] CV LAB;  Service: Cardiovascular;  Laterality: N/A;  . PORTACATH PLACEMENT Right     FAMILY HISTORY :   Family History  Problem Relation Age of Onset  . Heart disease Father     CABG x 4  . Hypertension Mother   . Pancreatic cancer  Mother   . Ulcers Mother   . Asthma Mother   . Diabetes Neg Hx     SOCIAL HISTORY:   Social History  Substance Use Topics  . Smoking status: Never Smoker  . Smokeless tobacco: Never Used  . Alcohol use No    ALLERGIES:  has No Known Allergies.  MEDICATIONS:  Current Outpatient Prescriptions  Medication Sig Dispense Refill  . acyclovir (ZOVIRAX) 400 MG tablet Take 400 mg by mouth 2 (two) times daily.    Marland Kitchen apixaban (ELIQUIS) 5 MG TABS tablet Take 1  tablet (5 mg total) by mouth 2 (two) times daily. From 10/27/15 60 tablet 3  . ranitidine (ZANTAC) 150 MG tablet Take 150 mg by mouth 2 (two) times daily.    Marland Kitchen senna-docusate (SENOKOT-S) 8.6-50 MG tablet Take 1 tablet by mouth at bedtime as needed for mild constipation. 30 tablet 2   No current facility-administered medications for this visit.    Facility-Administered Medications Ordered in Other Visits  Medication Dose Route Frequency Provider Last Rate Last Dose  . 0.9 %  sodium chloride infusion   Intravenous Continuous Evlyn Kanner, NP   Stopped at 10/14/15 1312  . methotrexate (50 mg/ml) 6.3 g in sodium chloride 0.9 % 1,000 mL injection   Intravenous Once Cammie Sickle, MD        PHYSICAL EXAMINATION: ECOG PERFORMANCE STATUS: 1 - Symptomatic but completely ambulatory  BP 138/84 (BP Location: Left Arm, Patient Position: Sitting)   Pulse 75   Temp 97.2 F (36.2 C) (Tympanic)   Resp 18   Wt 166 lb 12.8 oz (75.7 kg)   BMI 30.02 kg/m   Filed Weights   04/02/16 1112  Weight: 166 lb 12.8 oz (75.7 kg)    GENERAL: Well-nourished well-developed; Alert, no distress and comfortable.   Accompanied by her husband.  EYES: no pallor or icterus OROPHARYNX: no thrush; good dentition  NECK: supple, no masses felt LYMPH:  no palpable lymphadenopathy in the cervical, axillary or inguinal regions LUNGS: clear to auscultation and  No wheeze or crackles HEART/CVS: regular rate & rhythm and no murmurs; No lower extremity edema. ABDOMEN:abdomen soft, non-tender and normal bowel sounds Musculoskeletal:no cyanosis of digits and no clubbing  PSYCH: alert & oriented x 3 with fluent speech NEURO: no focal motor/sensory deficits SKIN:  no rashes or significant lesions;  LABORATORY DATA:  I have reviewed the data as listed    Component Value Date/Time   NA 140 04/02/2016 0955   NA 139 06/29/2010 0749   K 4.4 04/02/2016 0955   CL 108 04/02/2016 0955   CO2 29 04/02/2016 0955   GLUCOSE  102 (H) 04/02/2016 0955   BUN 8 04/02/2016 0955   BUN 16 06/29/2010 0749   CREATININE 0.96 04/02/2016 0955   CALCIUM 9.5 04/02/2016 0955   PROT 6.5 04/02/2016 0955   ALBUMIN 4.5 04/02/2016 0955   AST 24 04/02/2016 0955   ALT 19 04/02/2016 0955   ALKPHOS 61 04/02/2016 0955   BILITOT 0.8 04/02/2016 0955   GFRNONAA >60 04/02/2016 0955   GFRAA >60 04/02/2016 0955    No results found for: SPEP, UPEP  Lab Results  Component Value Date   WBC 5.1 04/02/2016   NEUTROABS 3.8 04/02/2016   HGB 11.7 (L) 04/02/2016   HCT 33.9 (L) 04/02/2016   MCV 89.8 04/02/2016   PLT 161 04/02/2016      Chemistry      Component Value Date/Time   NA 140 04/02/2016 0955  NA 139 06/29/2010 0749   K 4.4 04/02/2016 0955   CL 108 04/02/2016 0955   CO2 29 04/02/2016 0955   BUN 8 04/02/2016 0955   BUN 16 06/29/2010 0749   CREATININE 0.96 04/02/2016 0955   GLU 93 06/29/2010 0749      Component Value Date/Time   CALCIUM 9.5 04/02/2016 0955   ALKPHOS 61 04/02/2016 0955   AST 24 04/02/2016 0955   ALT 19 04/02/2016 0955   BILITOT 0.8 04/02/2016 0955       RADIOGRAPHIC STUDIES: I have personally reviewed the radiological images as listed and agreed with the findings in the report. No results found.   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 Currently, Status post 6 cycles of R CHOP- complete response on PET scan. S/p 4 doses x high dose MXT- last dose 2 weeks ago. I reviewed the case with Dr.Dittus [facility]; recommends a second opinion with him if patient has a recurrence. Patient agrees.  # # Today labs within normal limits ; CMP- Normal. Recommend a bone marrow biopsy to document complete remission.   # DVT- stop Eliquis today.   # Shingles prophylaxis- continue until next year.   # Port flush every 8 weeks.  # follow up 1 week affer the bmbx/port flush/labs.   Orders Placed  This Encounter  Procedures  . CT BIOPSY    Standing Status:   Future    Standing Expiration Date:   04/02/2017    Order Specific Question:   Lab orders requested (DO NOT place separate lab orders, these will be automatically ordered during procedure specimen collection):    Answer:   Other    Comments:   hematopathology    Order Specific Question:   Reason for Exam (SYMPTOM  OR DIAGNOSIS REQUIRED)    Answer:   lymphoma    Order Specific Question:   Preferred imaging location?    Answer:   [facility]  . CBC with Differential    Standing Status:   Future    Standing Expiration Date:   04/02/2017  . Comprehensive metabolic panel    Standing Status:   Future    Standing Expiration Date:   04/02/2017  . Lactate dehydrogenase    Standing Status:   Future    Standing Expiration Date:   04/02/2017     Cammie Sickle, MD 04/02/2016 12:56 PM

## 2016-04-02 NOTE — Assessment & Plan Note (Addendum)
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 Currently, Status post 6 cycles of R CHOP- complete response on PET scan. S/p 4 doses x high dose MXT- last dose 2 weeks ago. I reviewed the case with Dr.Dittus from [facility]; recommends a second opinion with him if patient has a recurrence. Patient agrees.  # # Today labs within normal limits ; CMP- Normal. Recommend a bone marrow biopsy to document complete remission.   # DVT- stop Eliquis today.   # Shingles prophylaxis- continue until next year.   # Port flush every 8 weeks.  # follow up 1 week affer the bmbx/port flush/labs.

## 2016-04-02 NOTE — Progress Notes (Signed)
Patient is here for follow up, can she finish her treatment and when can she go to the dentist .

## 2016-04-03 LAB — MISCELLANEOUS TEST

## 2016-04-09 LAB — MISCELLANEOUS TEST

## 2016-05-03 ENCOUNTER — Ambulatory Visit
Admission: RE | Admit: 2016-05-03 | Discharge: 2016-05-03 | Disposition: A | Discharge status: Home / Self Care | Payer: BLUE CROSS/BLUE SHIELD | Source: Ambulatory Visit | Attending: Internal Medicine | Admitting: Internal Medicine

## 2016-05-03 ENCOUNTER — Ambulatory Visit: Payer: BLUE CROSS/BLUE SHIELD

## 2016-05-03 ENCOUNTER — Encounter: Payer: Self-pay | Admitting: Internal Medicine

## 2016-05-03 DIAGNOSIS — K219 Gastro-esophageal reflux disease without esophagitis: Secondary | ICD-10-CM | POA: Diagnosis not present

## 2016-05-03 DIAGNOSIS — Z7901 Long term (current) use of anticoagulants: Secondary | ICD-10-CM | POA: Insufficient documentation

## 2016-05-03 DIAGNOSIS — C8330 Diffuse large B-cell lymphoma, unspecified site: Secondary | ICD-10-CM | POA: Diagnosis not present

## 2016-05-03 DIAGNOSIS — Z800 Family history of malignant neoplasm of digestive organs: Secondary | ICD-10-CM | POA: Diagnosis not present

## 2016-05-03 DIAGNOSIS — Z79899 Other long term (current) drug therapy: Secondary | ICD-10-CM | POA: Insufficient documentation

## 2016-05-03 DIAGNOSIS — Z8249 Family history of ischemic heart disease and other diseases of the circulatory system: Secondary | ICD-10-CM | POA: Insufficient documentation

## 2016-05-03 DIAGNOSIS — C8583 Other specified types of non-Hodgkin lymphoma, intra-abdominal lymph nodes: Secondary | ICD-10-CM

## 2016-05-03 LAB — CBC WITH DIFFERENTIAL/PLATELET
BASOS ABS: 0 10*3/uL (ref 0–0.1)
Basophils Relative: 1 %
EOS ABS: 0.1 10*3/uL (ref 0–0.7)
EOS PCT: 1 %
HCT: 37.7 % (ref 35.0–47.0)
Hemoglobin: 13.1 g/dL (ref 12.0–16.0)
LYMPHS PCT: 10 %
Lymphs Abs: 0.6 10*3/uL — ABNORMAL LOW (ref 1.0–3.6)
MCH: 30.3 pg (ref 26.0–34.0)
MCHC: 34.6 g/dL (ref 32.0–36.0)
MCV: 87.6 fL (ref 80.0–100.0)
MONO ABS: 0.4 10*3/uL (ref 0.2–0.9)
Monocytes Relative: 6 %
Neutro Abs: 5.4 10*3/uL (ref 1.4–6.5)
Neutrophils Relative %: 82 %
PLATELETS: 183 10*3/uL (ref 150–440)
RBC: 4.31 MIL/uL (ref 3.80–5.20)
RDW: 12.7 % (ref 11.5–14.5)
WBC: 6.6 10*3/uL (ref 3.6–11.0)

## 2016-05-03 LAB — PROTIME-INR
INR: 0.93
Prothrombin Time: 12.5 seconds (ref 11.4–15.2)

## 2016-05-03 LAB — APTT: APTT: 27 s (ref 24–36)

## 2016-05-03 MED ORDER — SODIUM CHLORIDE 0.9 % IV SOLN
INTRAVENOUS | Status: DC
  Administered 2016-05-03: 09:00:00 via INTRAVENOUS

## 2016-05-03 MED ORDER — HYDROCODONE-ACETAMINOPHEN 5-325 MG PO TABS
1.0000 | ORAL_TABLET | ORAL | Status: DC | PRN
  Filled 2016-05-03: qty 2

## 2016-05-03 MED ORDER — FENTANYL CITRATE (PF) 100 MCG/2ML IJ SOLN
INTRAMUSCULAR | Status: AC | PRN
  Administered 2016-05-03 (×2): 50 ug via INTRAVENOUS

## 2016-05-03 MED ORDER — MIDAZOLAM HCL 2 MG/2ML IJ SOLN
INTRAMUSCULAR | Status: AC | PRN
  Administered 2016-05-03 (×2): 1 mg via INTRAVENOUS

## 2016-05-03 NOTE — Procedures (Signed)
CT guided bone marrow biopsy.  2 aspirates and 1 core obtained from right ilium.  No immediate complication.   

## 2016-05-03 NOTE — H&P (Signed)
Chief Complaint: Patient was seen in consultation today for CT had a bone marrow biopsy at the request of Brahmanday,Govinda R  Referring Physician(s): Brahmanday,Govinda R  Patient Status: [place] - Out-pt  History of Present Illness: Stacy Murillo is a 60 y.o. female with history of diffuse large B-cell lymphoma. Patient has completed therapy and had a complete response based on PET/CT. Bone marrow biopsy has been requested to document complete remission. Patient is asymptomatic. She has no complaints today. She had a previous bone marrow biopsy without complication.  Past Medical History:  Diagnosis Date  . Arthritis   . Blood dyscrasia   . Cancer ([place])   . Chest pain, unspecified   . Diverticulosis 07/06/15  . Dysrhythmia   . Esophagitis 07/06/15  . Fatigue   . Gastritis 07/06/15  . GERD (gastroesophageal reflux disease)   . 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 Leg cramps   . Lymphoma ([place]) 09/14/15   Monoclonal B cell lymphoma  . Night sweats   . Osteoporosis   . Overweight(278.02)    Obesity  . Palpitations   . Shortness of breath dyspnea   . Tachycardia   . Thrombocytopenia ([place])     Past Surgical History:  Procedure Laterality Date  . ABDOMINAL HYSTERECTOMY  2005  . BONE MARROW BIOPSY  09/14/15  . CHOLECYSTECTOMY  1997  . COLONOSCOPY  07/05/2014  . ESOPHAGOGASTRODUODENOSCOPY  07/05/2014  . INGUINAL LYMPH NODE BIOPSY Right 10/05/2015   Procedure: INGUINAL LYMPH NODE BIOPSY;  Surgeon: Christene Lye, MD;  Location: [facility] ORS;  Service: General;  Laterality: Right;  . PERIPHERAL VASCULAR CATHETERIZATION N/A 09/27/2015   Procedure: Glori Luis Cath Insertion;  Surgeon: Algernon Huxley, MD;  Location: [place] [facility];  Service: Cardiovascular;  Laterality: N/A;  . PORTACATH PLACEMENT Right     Allergies: Patient has no known allergies.  Medications: Prior to Admission medications   Medication Sig Start Date End Date Taking? Authorizing Provider  acyclovir (ZOVIRAX)  400 MG tablet Take 400 mg by mouth 2 (two) times daily.    Historical Provider, MD  apixaban (ELIQUIS) 5 MG TABS tablet Take 1 tablet (5 mg total) by mouth 2 (two) times daily. From 10/27/15 10/27/15   Cammie Sickle, MD  ranitidine (ZANTAC) 150 MG tablet Take 150 mg by mouth 2 (two) times daily.    Historical Provider, MD     Family History  Problem Relation Age of Onset  . Heart disease Father     CABG x 4  . Hypertension Mother   . Pancreatic cancer Mother   . Ulcers Mother   . Asthma Mother   . Diabetes Neg Hx     Social History   Social History  . Marital status: Married    Spouse name: N/A  . Number of children: N/A  . Years of education: N/A   Social History Main Topics  . Smoking status: Never Smoker  . Smokeless tobacco: Never Used  . Alcohol use No  . Drug use: No  . Sexual activity: Yes    Birth control/ protection: None   Other Topics Concern  . Not on file   Social History Narrative   Married   Does not get regular exercise     Review of Systems: A 12 point ROS discussed and pertinent positives are indicated in the HPI above.  All other systems are negative.  Review of Systems  Constitutional: Negative for activity change, appetite change, chills and fever.  Respiratory: Negative.  Cardiovascular: Negative.   Gastrointestinal: Negative.   Genitourinary: Negative.      Physical Exam  Constitutional: She appears well-developed.  Cardiovascular: Normal rate, regular rhythm and normal heart sounds.   Pulmonary/Chest: Effort normal and breath sounds normal. No respiratory distress. She has no wheezes. She has no rales.  Abdominal: Soft. She exhibits no distension.    Mallampati Score:  MD Evaluation Airway: WNL Heart: WNL Abdomen: WNL Chest/ Lungs: WNL ASA  Classification: 2 Mallampati/Airway Score: One  Imaging: No results found.  Labs:  CBC:  Recent Labs  03/22/16 0409 03/23/16 0459 03/28/16 1042 04/02/16 0955  WBC 2.7*  2.9* 5.4 5.1  HGB 9.5* 9.3* 11.1* 11.7*  HCT 27.7* 27.1* 32.2* 33.9*  PLT 128* 125* 119* 161    COAGS:  Recent Labs  09/05/15 1137 09/13/15 0745 10/19/15 1338  INR 1.02 1.02 1.26  APTT <24* 23* >160*    BMP:  Recent Labs  03/22/16 0409 03/23/16 0459 03/28/16 1042 04/02/16 0955  NA 143 142 141 140  K 3.4* 3.6 4.5 4.4  CL 104 103 108 108  CO2 36* 34* 27 29  GLUCOSE 92 85 99 102*  BUN 11 12 14 8   CALCIUM 8.5* 8.6* 9.4 9.5  CREATININE 1.04* 1.02* 1.07* 0.96  GFRNONAA 57* 58* 55* >60  GFRAA >60 >60 >60 >60    LIVER FUNCTION TESTS:  Recent Labs  02/27/16 0935 03/08/16 1008 03/19/16 0829 04/02/16 0955  BILITOT 0.8 0.5 0.7 0.8  AST 22 33 30 24  ALT 19 37 21 19  ALKPHOS 54 77 64 61  PROT 6.3* 6.5 6.2* 6.5  ALBUMIN 4.1 4.4 4.1 4.5    TUMOR MARKERS: No results for input(s): AFPTM, CEA, CA199, CHROMGRNA in the last 8760 hours.  Assessment and Plan:  60 year-old with lymphoma which appears to be in remission. Scheduled for CT-guided bone marrow biopsy to confirm complete remission. The procedure and risks were discussed with the patient. Informed consent was obtained. Plan for CT-guided bone marrow biopsy with moderate sedation.  Thank you for this interesting consult.  I greatly enjoyed meeting Stacy Murillo and look forward to participating in their care.  A copy of this report was sent to the requesting provider on this date.  Electronically Signed: Carylon Perches 05/03/2016, 8:54 AM   I spent a total of  15 Minutes   in face to face in clinical consultation, greater than 50% of which was counseling/coordinating care for CT-guided bone marrow biopsy.

## 2016-05-03 NOTE — Discharge Instructions (Signed)
Needle Biopsy of the Bone °Introduction °A bone biopsy is a procedure in which a small sample of bone is removed. The sample is taken with a needle. Then, the bone sample is looked at under a microscope to check for abnormalities. The sample is usually taken from a bone that is close to the skin. This procedure may be done to check for various problems with the bone. You may need this procedure if imaging tests or blood tests have indicated a possible problem. This procedure may be done to help determine if a bone tumor is cancerous (malignant). A bone biopsy can help to diagnose problems such as: °· Tumors of the bone (sarcomas) and bone marrow (multiple myeloma). °· Bone that forms abnormally (Paget disease). °· Noncancerous (benign) bone cysts. °· Bony growths. °· Infections in the bone. °Tell a health care provider about: °· Any allergies you have. °· All medicines you are taking, including vitamins, herbs, eye drops, creams, and over-the-counter medicines. °· Any problems you or family members have had with anesthetic medicines. °· Any blood disorders you have. °· Any surgeries you have had. °· Any medical conditions you have. °What are the risks? °Generally, this is a safe procedure. However, problems may occur, including: °· Excessive bleeding. °· Infection. °· Injury to surrounding tissue. °What happens before the procedure? °· Ask your health care provider about: °¨ Changing or stopping your regular medicines. This is especially important if you are taking diabetes medicines or blood thinners. °¨ Taking medicines such as aspirin and ibuprofen. These medicines can thin your blood. Do not take these medicines before your procedure if your health care provider instructs you not to. °· Follow instructions from your health care provider about eating or drinking restrictions. °· Plan to have someone take you home after the procedure. °· If you go home right after the procedure, plan to have someone with you for  24 hours. °What happens during the procedure? °· An IV tube may be inserted into one of your veins. °· The injection site will be cleaned with a germ-killing solution (antiseptic). °· You will be given one or more of the following: °¨ A medicine to help you relax (sedative). °¨ A medicine to numb the area (local anesthetic). °· The sample of bone will be removed by putting a large needle through the skin and into the bone. °· The needle will be removed. °· A bandage (dressing) will be placed over the insertion site and taped in place. °The procedure may vary among health care providers and hospitals. °What happens after the procedure? °· Your blood pressure, heart rate, breathing rate, and blood oxygen level will be monitored often until the medicines you were given have worn off. °· Return to your normal activities as told by your health care provider. °This information is not intended to replace advice given to you by your health care provider. Make sure you discuss any questions you have with your health care provider. °Document Released: 03/08/2004 Document Revised: 10/06/2015 Document Reviewed: 06/07/2014 °© 2017 Elsevier ° °

## 2016-05-10 ENCOUNTER — Encounter: Payer: Self-pay | Admitting: Internal Medicine

## 2016-05-17 ENCOUNTER — Inpatient Hospital Stay: Payer: BLUE CROSS/BLUE SHIELD

## 2016-05-17 ENCOUNTER — Inpatient Hospital Stay: Payer: BLUE CROSS/BLUE SHIELD | Attending: Internal Medicine | Admitting: Internal Medicine

## 2016-05-17 VITALS — BP 130/77 | HR 71 | Temp 96.8°F | Wt 168.0 lb

## 2016-05-17 DIAGNOSIS — M1990 Unspecified osteoarthritis, unspecified site: Secondary | ICD-10-CM | POA: Diagnosis not present

## 2016-05-17 DIAGNOSIS — C8583 Other specified types of non-Hodgkin lymphoma, intra-abdominal lymph nodes: Secondary | ICD-10-CM

## 2016-05-17 DIAGNOSIS — Z9221 Personal history of antineoplastic chemotherapy: Secondary | ICD-10-CM

## 2016-05-17 DIAGNOSIS — C8333 Diffuse large B-cell lymphoma, intra-abdominal lymph nodes: Secondary | ICD-10-CM

## 2016-05-17 DIAGNOSIS — E669 Obesity, unspecified: Secondary | ICD-10-CM | POA: Insufficient documentation

## 2016-05-17 DIAGNOSIS — K219 Gastro-esophageal reflux disease without esophagitis: Secondary | ICD-10-CM | POA: Diagnosis not present

## 2016-05-17 DIAGNOSIS — Z86718 Personal history of other venous thrombosis and embolism: Secondary | ICD-10-CM

## 2016-05-17 DIAGNOSIS — Z79899 Other long term (current) drug therapy: Secondary | ICD-10-CM

## 2016-05-17 LAB — COMPREHENSIVE METABOLIC PANEL
ALBUMIN: 4.1 g/dL (ref 3.5–5.0)
ALK PHOS: 62 U/L (ref 38–126)
ALT: 33 U/L (ref 14–54)
AST: 37 U/L (ref 15–41)
Anion gap: 6 (ref 5–15)
BILIRUBIN TOTAL: 0.6 mg/dL (ref 0.3–1.2)
BUN: 16 mg/dL (ref 6–20)
CALCIUM: 9.2 mg/dL (ref 8.9–10.3)
CO2: 26 mmol/L (ref 22–32)
CREATININE: 0.93 mg/dL (ref 0.44–1.00)
Chloride: 105 mmol/L (ref 101–111)
GFR calc Af Amer: >60 mL/min (ref >60)
GFR calc non Af Amer: >60 mL/min (ref >60)
GLUCOSE: 98 mg/dL (ref 65–99)
Potassium: 4.1 mmol/L (ref 3.5–5.1)
SODIUM: 137 mmol/L (ref 135–145)
TOTAL PROTEIN: 6.7 g/dL (ref 6.5–8.1)

## 2016-05-17 LAB — CBC WITH DIFFERENTIAL/PLATELET
BASOS PCT: 1 %
Basophils Absolute: 0 10*3/uL (ref 0–0.1)
EOS ABS: 0.1 10*3/uL (ref 0–0.7)
Eosinophils Relative: 3 %
HEMATOCRIT: 37 % (ref 35.0–47.0)
HEMOGLOBIN: 12.7 g/dL (ref 12.0–16.0)
LYMPHS ABS: 0.7 10*3/uL — AB (ref 1.0–3.6)
Lymphocytes Relative: 19 %
MCH: 29.7 pg (ref 26.0–34.0)
MCHC: 34.4 g/dL (ref 32.0–36.0)
MCV: 86.3 fL (ref 80.0–100.0)
Monocytes Absolute: 0.4 10*3/uL (ref 0.2–0.9)
Monocytes Relative: 9 %
NEUTROS ABS: 2.8 10*3/uL (ref 1.4–6.5)
NEUTROS PCT: 68 %
Platelets: 167 10*3/uL (ref 150–440)
RBC: 4.29 MIL/uL (ref 3.80–5.20)
RDW: 12.1 % (ref 11.5–14.5)
WBC: 4 10*3/uL (ref 3.6–11.0)

## 2016-05-17 LAB — LACTATE DEHYDROGENASE: LDH: 141 U/L (ref 98–192)

## 2016-05-17 MED ORDER — HEPARIN SOD (PORK) LOCK FLUSH 100 UNIT/ML IV SOLN
INTRAVENOUS | Status: AC
  Filled 2016-05-17: qty 5

## 2016-05-17 MED ORDER — SODIUM CHLORIDE 0.9% FLUSH
10.0000 mL | INTRAVENOUS | Status: DC | PRN
  Administered 2016-05-17: 10 mL via INTRAVENOUS
  Filled 2016-05-17: qty 10

## 2016-05-17 MED ORDER — HEPARIN SOD (PORK) LOCK FLUSH 100 UNIT/ML IV SOLN
500.0000 [IU] | Freq: Once | INTRAVENOUS | Status: AC
  Administered 2016-05-17: 500 [IU] via INTRAVENOUS

## 2016-05-17 NOTE — Assessment & Plan Note (Signed)
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 Currently, Status post 6 cycles of R CHOP- complete response on PET scan. S/p 4 doses x high dose MXT. December 22nd bone marrow biopsy [posttreatment]- "sparse atypical B cells noted <1%".   # I would recommend a repeat PET scan/ [mid-March 2018]bone marrow biopsy-if needed based upon the PET scan. # Recommend evaluation with  Dr.Dittus from [facility]; recommends a second opinion.   #  history of DVT noted recurrence-currently off anticoagulation.    # Shingles prophylaxis-  can come off acyclovir.   #Follow-up labs /visit /port flush in 6 weeks.   we will order a PET scan at that visit.

## 2016-05-17 NOTE — Progress Notes (Signed)
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 CT- 1-2 CM LN subpectoral/medistinal/ retro-peritoneal/pelvic/ Right inguinal LN 1.6cm- Bx- DLBCL- ABC; myc-POS; FISH gene re-arragement-NEG. PET- MULTIPLE LN/ Bone involvement; R-CHOP x6- CR'# OCT 2017- PET scan- CR; DEC 22nd 2017- BMBx- "atypical large B cells- <1%.   # Lumbar puncture- difficult-spinal headache. S/p high dose MXT x4.   # June 2017-Elevated LFTs- valacylovir/diflucan; resolved.   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  Patient appetite is good. No weight loss. No cough or shortness of breath. No chest pain. No cramps in the legs. No tingling or numbness.  REVIEW OF SYSTEMS:  A complete 10  point review of system is done which is negative except mentioned above/history of present illness.   PAST MEDICAL HISTORY :  Past Medical History:  Diagnosis Date  . Arthritis   . Blood dyscrasia   . Cancer ([place])   . Chest pain, unspecified   . Diverticulosis 07/06/15  . Dysrhythmia   . Esophagitis 07/06/15  . Fatigue   . Gastritis 07/06/15  . GERD (gastroesophageal reflux disease)   . 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 Leg cramps   . Lymphoma ([place]) 09/14/15   Monoclonal B cell lymphoma  . Night sweats   . Osteoporosis   . Overweight(278.02)    Obesity  . Palpitations   . Shortness of breath dyspnea   . Tachycardia   . Thrombocytopenia ([place])     PAST SURGICAL HISTORY :   Past Surgical History:  Procedure Laterality Date  . ABDOMINAL HYSTERECTOMY  2005  . BONE MARROW BIOPSY  09/14/15  . CHOLECYSTECTOMY  1997  . COLONOSCOPY  07/05/2014  . ESOPHAGOGASTRODUODENOSCOPY  07/05/2014  . INGUINAL LYMPH NODE BIOPSY Right 10/05/2015   Procedure: INGUINAL LYMPH NODE BIOPSY;  Surgeon: Christene Lye, MD;  Location: [facility] ORS;  Service: General;  Laterality: Right;  . PERIPHERAL VASCULAR CATHETERIZATION N/A 09/27/2015   Procedure: Glori Luis Cath Insertion;  Surgeon: Algernon Huxley, MD;  Location: [place] CV LAB;  Service: Cardiovascular;  Laterality: N/A;  . PORTACATH PLACEMENT Right     FAMILY HISTORY :   Family History  Problem Relation Age of Onset  . Heart disease Father     CABG x 4  . Hypertension Mother   .  Pancreatic cancer Mother   . Ulcers Mother   . Asthma Mother   . Diabetes Neg Hx     SOCIAL HISTORY:   Social History  Substance Use Topics  . Smoking status: Never Smoker  . Smokeless tobacco: Never Used  . Alcohol use No    ALLERGIES:  has No Known Allergies.  MEDICATIONS:  Current Outpatient Prescriptions  Medication Sig Dispense Refill  . acetaminophen (TYLENOL 8 HOUR) 650 MG CR tablet Take 650 mg by mouth every 8 (eight) hours as needed for pain.    Marland Kitchen  acyclovir (ZOVIRAX) 400 MG tablet Take 400 mg by mouth 2 (two) times daily.    Marland Kitchen glucosamine-chondroitin 500-400 MG tablet Take 1 tablet by mouth 2 (two) times daily.    . Omega-3 Fatty Acids (FISH OIL PO) Take 1 capsule by mouth daily.    . ranitidine (ZANTAC) 150 MG tablet Take 150 mg by mouth 2 (two) times daily.     No current facility-administered medications for this visit.    Facility-Administered Medications Ordered in Other Visits  Medication Dose Route Frequency Provider Last Rate Last Dose  . 0.9 %  sodium chloride infusion   Intravenous Continuous Evlyn Kanner, NP   Stopped at 10/14/15 1312  . methotrexate (50 mg/ml) 6.3 g in sodium chloride 0.9 % 1,000 mL injection   Intravenous Once Cammie Sickle, MD        PHYSICAL EXAMINATION: ECOG PERFORMANCE STATUS: 1 - Symptomatic but completely ambulatory  BP 130/77 (BP Location: Right Arm, Patient Position: Sitting)   Pulse 71   Temp (!) 96.8 F (36 C) (Tympanic)   Wt 168 lb (76.2 kg)   BMI 30.24 kg/m   Filed Weights   05/17/16 0928  Weight: 168 lb (76.2 kg)    GENERAL: Well-nourished well-developed; Alert, no distress and comfortable.   Accompanied by her husband.  EYES: no pallor or icterus OROPHARYNX: no thrush; good dentition  NECK: supple, no masses felt LYMPH:  no palpable lymphadenopathy in the cervical, axillary or inguinal regions LUNGS: clear to auscultation and  No wheeze or crackles HEART/CVS: regular rate & rhythm and no murmurs; No lower extremity edema. ABDOMEN:abdomen soft, non-tender and normal bowel sounds Musculoskeletal:no cyanosis of digits and no clubbing  PSYCH: alert & oriented x 3 with fluent speech NEURO: no focal motor/sensory deficits SKIN:  no rashes or significant lesions;  LABORATORY DATA:  I have reviewed the data as listed    Component Value Date/Time   NA 137 05/17/2016 0901   NA 139 06/29/2010 0749   K 4.1 05/17/2016 0901   CL 105 05/17/2016 0901   CO2 26 05/17/2016  0901   GLUCOSE 98 05/17/2016 0901   BUN 16 05/17/2016 0901   BUN 16 06/29/2010 0749   CREATININE 0.93 05/17/2016 0901   CALCIUM 9.2 05/17/2016 0901   PROT 6.7 05/17/2016 0901   ALBUMIN 4.1 05/17/2016 0901   AST 37 05/17/2016 0901   ALT 33 05/17/2016 0901   ALKPHOS 62 05/17/2016 0901   BILITOT 0.6 05/17/2016 0901   GFRNONAA >60 05/17/2016 0901   GFRAA >60 05/17/2016 0901    No results found for: SPEP, UPEP  Lab Results  Component Value Date   WBC 4.0 05/17/2016   NEUTROABS 2.8 05/17/2016   HGB 12.7 05/17/2016   HCT 37.0 05/17/2016   MCV 86.3 05/17/2016   PLT 167 05/17/2016      Chemistry      Component Value Date/Time   NA 137 05/17/2016  0901   NA 139 06/29/2010 0749   K 4.1 05/17/2016 0901   CL 105 05/17/2016 0901   CO2 26 05/17/2016 0901   BUN 16 05/17/2016 0901   BUN 16 06/29/2010 0749   CREATININE 0.93 05/17/2016 0901   GLU 93 06/29/2010 0749      Component Value Date/Time   CALCIUM 9.2 05/17/2016 0901   ALKPHOS 62 05/17/2016 0901   AST 37 05/17/2016 0901   ALT 33 05/17/2016 0901   BILITOT 0.6 05/17/2016 0901       RADIOGRAPHIC STUDIES: I have personally reviewed the radiological images as listed and agreed with the findings in the report. No results found.   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 Currently, Status post 6 cycles of R CHOP- complete response on PET scan. S/p 4 doses x high dose MXT. December 22nd bone marrow biopsy [posttreatment]- "sparse atypical B cells noted <1%".   # I would recommend a repeat PET scan/ [mid-March 2018]bone marrow biopsy-if needed based upon the PET scan. # Recommend evaluation with  Dr.Dittus from [facility]; recommends a second opinion.   #  history of DVT noted recurrence-currently off anticoagulation.    # Shingles prophylaxis-  can come off acyclovir.   #Follow-up labs /visit /port flush in 6  weeks.   we will order a PET scan at that visit.    No orders of the defined types were placed in this encounter.    Cammie Sickle, MD 05/17/2016 9:59 AM

## 2016-05-17 NOTE — Progress Notes (Signed)
Patient here today for follow up.  Patient states no new concerns today  

## 2016-05-25 DIAGNOSIS — C8330 Diffuse large B-cell lymphoma, unspecified site: Secondary | ICD-10-CM | POA: Insufficient documentation

## 2016-05-29 ENCOUNTER — Telehealth: Payer: Self-pay | Admitting: Internal Medicine

## 2016-05-29 NOTE — Telephone Encounter (Signed)
Spoke to Dr.Grover; [facility]- no new recommendations. Plan surviellance for now. Follow up as planned. Dr.B

## 2016-06-08 LAB — SURGICAL PATHOLOGY

## 2016-06-28 ENCOUNTER — Inpatient Hospital Stay: Payer: BLUE CROSS/BLUE SHIELD

## 2016-06-28 ENCOUNTER — Inpatient Hospital Stay: Payer: BLUE CROSS/BLUE SHIELD | Attending: Internal Medicine | Admitting: Internal Medicine

## 2016-06-28 VITALS — BP 125/81 | HR 67 | Temp 96.5°F | Wt 170.4 lb

## 2016-06-28 DIAGNOSIS — Z79899 Other long term (current) drug therapy: Secondary | ICD-10-CM | POA: Insufficient documentation

## 2016-06-28 DIAGNOSIS — Z86718 Personal history of other venous thrombosis and embolism: Secondary | ICD-10-CM | POA: Diagnosis not present

## 2016-06-28 DIAGNOSIS — Z7901 Long term (current) use of anticoagulants: Secondary | ICD-10-CM | POA: Insufficient documentation

## 2016-06-28 DIAGNOSIS — C8583 Other specified types of non-Hodgkin lymphoma, intra-abdominal lymph nodes: Secondary | ICD-10-CM

## 2016-06-28 DIAGNOSIS — K219 Gastro-esophageal reflux disease without esophagitis: Secondary | ICD-10-CM | POA: Diagnosis not present

## 2016-06-28 DIAGNOSIS — Z95828 Presence of other vascular implants and grafts: Secondary | ICD-10-CM

## 2016-06-28 DIAGNOSIS — E669 Obesity, unspecified: Secondary | ICD-10-CM | POA: Diagnosis not present

## 2016-06-28 DIAGNOSIS — C8333 Diffuse large B-cell lymphoma, intra-abdominal lymph nodes: Secondary | ICD-10-CM | POA: Diagnosis not present

## 2016-06-28 DIAGNOSIS — M1990 Unspecified osteoarthritis, unspecified site: Secondary | ICD-10-CM | POA: Diagnosis not present

## 2016-06-28 LAB — CBC WITH DIFFERENTIAL/PLATELET
BASOS PCT: 1 %
Basophils Absolute: 0 10*3/uL (ref 0–0.1)
EOS ABS: 0.1 10*3/uL (ref 0–0.7)
Eosinophils Relative: 3 %
HCT: 36.1 % (ref 35.0–47.0)
Hemoglobin: 12.4 g/dL (ref 12.0–16.0)
Lymphocytes Relative: 18 %
Lymphs Abs: 0.8 10*3/uL — ABNORMAL LOW (ref 1.0–3.6)
MCH: 29 pg (ref 26.0–34.0)
MCHC: 34.5 g/dL (ref 32.0–36.0)
MCV: 84.2 fL (ref 80.0–100.0)
MONO ABS: 0.4 10*3/uL (ref 0.2–0.9)
MONOS PCT: 9 %
NEUTROS ABS: 2.9 10*3/uL (ref 1.4–6.5)
NEUTROS PCT: 69 %
Platelets: 176 10*3/uL (ref 150–440)
RBC: 4.28 MIL/uL (ref 3.80–5.20)
RDW: 12.6 % (ref 11.5–14.5)
WBC: 4.1 10*3/uL (ref 3.6–11.0)

## 2016-06-28 LAB — COMPREHENSIVE METABOLIC PANEL
ALBUMIN: 4.3 g/dL (ref 3.5–5.0)
ALT: 17 U/L (ref 14–54)
ANION GAP: 7 (ref 5–15)
AST: 23 U/L (ref 15–41)
Alkaline Phosphatase: 60 U/L (ref 38–126)
BUN: 15 mg/dL (ref 6–20)
CHLORIDE: 104 mmol/L (ref 101–111)
CO2: 25 mmol/L (ref 22–32)
Calcium: 9 mg/dL (ref 8.9–10.3)
Creatinine, Ser: 0.91 mg/dL (ref 0.44–1.00)
GFR calc Af Amer: >60 mL/min (ref >60)
GFR calc non Af Amer: >60 mL/min (ref >60)
GLUCOSE: 104 mg/dL — AB (ref 65–99)
POTASSIUM: 3.6 mmol/L (ref 3.5–5.1)
SODIUM: 136 mmol/L (ref 135–145)
TOTAL PROTEIN: 6.6 g/dL (ref 6.5–8.1)
Total Bilirubin: 0.9 mg/dL (ref 0.3–1.2)

## 2016-06-28 LAB — LACTATE DEHYDROGENASE: LDH: 133 U/L (ref 98–192)

## 2016-06-28 MED ORDER — HEPARIN SOD (PORK) LOCK FLUSH 100 UNIT/ML IV SOLN
500.0000 [IU] | Freq: Once | INTRAVENOUS | Status: AC
  Administered 2016-06-28: 500 [IU] via INTRAVENOUS

## 2016-06-28 MED ORDER — SODIUM CHLORIDE 0.9% FLUSH
10.0000 mL | INTRAVENOUS | Status: DC | PRN
  Administered 2016-06-28: 10 mL via INTRAVENOUS
  Filled 2016-06-28: qty 10

## 2016-06-28 NOTE — Progress Notes (Signed)
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 CT- 1-2 CM LN subpectoral/medistinal/ retro-peritoneal/pelvic/ Right inguinal LN 1.6cm- Bx- DLBCL- ABC; myc-POS; FISH gene re-arragement-NEG. PET- MULTIPLE LN/ Bone involvement; R-CHOP x6- CR'# OCT 2017- PET scan- CR; DEC 22nd 2017- BMBx- "atypical large B cells- <1%.   # Lumbar puncture- difficult-spinal headache. S/p high dose MXT x4.   # June 2017-Elevated LFTs- valacylovir/diflucan; resolved.   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   In the interim patient was evaluated at [facility] for a second opinion for her lymphoma.  Patient appetite is good. No weight loss. No cough or shortness of breath. No chest pain. No cramps in the legs. No  tingling or numbness. No lumps in the legs. No fevers. No chills. Complains of mild hip pain.   REVIEW OF SYSTEMS:  A complete 10 point review of system is done which is negative except mentioned above/history of present illness.   PAST MEDICAL HISTORY :  Past Medical History:  Diagnosis Date  . Arthritis   . Blood dyscrasia   . Cancer ([place])   . Chest pain, unspecified   . Diverticulosis 07/06/15  . Dysrhythmia   . Esophagitis 07/06/15  . Fatigue   . Gastritis 07/06/15  . GERD (gastroesophageal reflux disease)   . 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 Leg cramps   . Lymphoma ([place]) 09/14/15   Monoclonal B cell lymphoma  . Night sweats   . Osteoporosis   . Overweight(278.02)    Obesity  . Palpitations   . Shortness of breath dyspnea   . Tachycardia   . Thrombocytopenia ([place])     PAST SURGICAL HISTORY :   Past Surgical History:  Procedure Laterality Date  . ABDOMINAL HYSTERECTOMY  2005  . BONE MARROW BIOPSY  09/14/15  . CHOLECYSTECTOMY  1997  . COLONOSCOPY  07/05/2014  . ESOPHAGOGASTRODUODENOSCOPY  07/05/2014  . INGUINAL LYMPH NODE BIOPSY Right 10/05/2015   Procedure: INGUINAL LYMPH NODE BIOPSY;  Surgeon: Christene Lye, MD;  Location: [facility] ORS;  Service: General;  Laterality: Right;  . PERIPHERAL VASCULAR CATHETERIZATION N/A 09/27/2015   Procedure: Glori Luis Cath Insertion;  Surgeon: Algernon Huxley, MD;  Location: [place] [facility];  Service: Cardiovascular;  Laterality: N/A;  . PORTACATH PLACEMENT Right     FAMILY HISTORY :  Family History  Problem Relation Age of Onset  . Heart disease Father     CABG x 4  . Hypertension Mother   . Pancreatic cancer Mother   . Ulcers Mother   . Asthma Mother   . Diabetes Neg Hx     SOCIAL HISTORY:   Social History  Substance Use Topics  . Smoking status: Never Smoker  . Smokeless tobacco: Never Used  . Alcohol use No    ALLERGIES:  has No Known Allergies.  MEDICATIONS:  Current Outpatient Prescriptions  Medication Sig  Dispense Refill  . acetaminophen (TYLENOL 8 HOUR) 650 MG CR tablet Take 650 mg by mouth every 8 (eight) hours as needed for pain.    Marland Kitchen Alum Hydroxide-Mag Carbonate (GAVISCON PO) Take by mouth.    . calcium citrate-vitamin D (CITRACAL+D) 315-200 MG-UNIT tablet Take 2 tablets by mouth daily.    . Cholecalciferol (VITAMIN D-3) 5000 units TABS Take 5,000 Units by mouth daily.    Marland Kitchen glucosamine-chondroitin 500-400 MG tablet Take 1 tablet by mouth 2 (two) times daily.    Marland Kitchen lidocaine-prilocaine (EMLA) cream   2  . Omega-3 Fatty Acids (FISH OIL PO) Take 1 capsule by mouth daily.    . ranitidine (ZANTAC) 150 MG tablet Take 150 mg by mouth 2 (two) times daily.     No current facility-administered medications for this visit.    Facility-Administered Medications Ordered in Other Visits  Medication Dose Route Frequency Provider Last Rate Last Dose  . 0.9 %  sodium chloride infusion   Intravenous Continuous Evlyn Kanner, NP   Stopped at 10/14/15 1312  . methotrexate (50 mg/ml) 6.3 g in sodium chloride 0.9 % 1,000 mL injection   Intravenous Once Cammie Sickle, MD      . sodium chloride flush (NS) 0.9 % injection 10 mL  10 mL Intravenous PRN Cammie Sickle, MD   10 mL at 06/28/16 0900    PHYSICAL EXAMINATION: ECOG PERFORMANCE STATUS: 1 - Symptomatic but completely ambulatory  BP 125/81 (BP Location: Right Arm, Patient Position: Sitting)   Pulse 67   Temp (!) 96.5 F (35.8 C) (Tympanic)   Wt 170 lb 6 oz (77.3 kg)   BMI 30.67 kg/m   Filed Weights   06/28/16 0916  Weight: 170 lb 6 oz (77.3 kg)    GENERAL: Well-nourished well-developed; Alert, no distress and comfortable.   Accompanied by her husband.  EYES: no pallor or icterus OROPHARYNX: no thrush; good dentition  NECK: supple, no masses felt LYMPH:  no palpable lymphadenopathy in the cervical, axillary or inguinal regions LUNGS: clear to auscultation and  No wheeze or crackles HEART/CVS: regular rate & rhythm and no murmurs;  No lower extremity edema. ABDOMEN:abdomen soft, non-tender and normal bowel sounds Musculoskeletal:no cyanosis of digits and no clubbing  PSYCH: alert & oriented x 3 with fluent speech NEURO: no focal motor/sensory deficits SKIN:  no rashes or significant lesions;  LABORATORY DATA:  I have reviewed the data as listed    Component Value Date/Time   NA 136 06/28/2016 0845   NA 139 06/29/2010 0749   K 3.6 06/28/2016 0845   CL 104 06/28/2016 0845   CO2 25 06/28/2016 0845   GLUCOSE 104 (H) 06/28/2016 0845   BUN 15 06/28/2016 0845   BUN 16 06/29/2010 0749   CREATININE 0.91 06/28/2016 0845   CALCIUM 9.0 06/28/2016 0845   PROT 6.6 06/28/2016 0845   ALBUMIN 4.3 06/28/2016 0845   AST 23 06/28/2016 0845  ALT 17 06/28/2016 0845   ALKPHOS 60 06/28/2016 0845   BILITOT 0.9 06/28/2016 0845   GFRNONAA >60 06/28/2016 0845   GFRAA >60 06/28/2016 0845    No results found for: SPEP, UPEP  Lab Results  Component Value Date   WBC 4.1 06/28/2016   NEUTROABS 2.9 06/28/2016   HGB 12.4 06/28/2016   HCT 36.1 06/28/2016   MCV 84.2 06/28/2016   PLT 176 06/28/2016      Chemistry      Component Value Date/Time   NA 136 06/28/2016 0845   NA 139 06/29/2010 0749   K 3.6 06/28/2016 0845   CL 104 06/28/2016 0845   CO2 25 06/28/2016 0845   BUN 15 06/28/2016 0845   BUN 16 06/29/2010 0749   CREATININE 0.91 06/28/2016 0845   GLU 93 06/29/2010 0749      Component Value Date/Time   CALCIUM 9.0 06/28/2016 0845   ALKPHOS 60 06/28/2016 0845   AST 23 06/28/2016 0845   ALT 17 06/28/2016 0845   BILITOT 0.9 06/28/2016 0845       RADIOGRAPHIC STUDIES: I have personally reviewed the radiological images as listed and agreed with the findings in the report. No results found.   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 Currently, Status post 6 cycles of R CHOP- complete response on PET  scan. S/p 4 doses x high dose MXT. December 22nd bone marrow biopsy [posttreatment]- "sparse atypical B cells noted <1%". However review of pathology at [facility]/second opinion- no concerns for lymphoma involvement at this time. Discussed with the patient at length. Plan is to repeat a PET scan in approximately 6 weeks. Also reviewed the consultation from [facility]. Discussed with Dr. Thera Flake at [facility].   #  history of DVT noted recurrence-currently off anticoagulation.    #Follow-up labs /visit /port flush in 6 weeks; PET scan prior. Labs from today within normal limits.  # 25 minutes face-to-face with the patient discussing the above plan of care; more than 50% of time spent on prognosis/ natural history; counseling and coordination.  Orders Placed This Encounter  Procedures  . NM PET Image Restag (PS) Skull Base To Thigh    Standing Status:   Future    Standing Expiration Date:   08/28/2017    Order Specific Question:   Reason for Exam (SYMPTOM  OR DIAGNOSIS REQUIRED)    Answer:   lymphoma    Order Specific Question:   Preferred imaging location?    Answer:   [facility]  . CBC with Differential/Platelet    Standing Status:   Future    Standing Expiration Date:   12/26/2016  . Comprehensive metabolic panel    Standing Status:   Future    Standing Expiration Date:   12/26/2016  . Lactate dehydrogenase    Standing Status:   Future    Standing Expiration Date:   12/26/2016     Cammie Sickle, MD 06/28/2016 11:13 AM

## 2016-06-28 NOTE — Assessment & Plan Note (Addendum)
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 Currently, Status post 6 cycles of R CHOP- complete response on PET scan. S/p 4 doses x high dose MXT. December 22nd bone marrow biopsy [posttreatment]- "sparse atypical B cells noted <1%". However review of pathology at [facility]/second opinion- no concerns for lymphoma involvement at this time. Discussed with the patient at length. Plan is to repeat a PET scan in approximately 6 weeks. Also reviewed the consultation from [facility]. Discussed with Dr. Thera Flake at [facility].   #  history of DVT noted recurrence-currently off anticoagulation.    #Follow-up labs /visit /port flush in 6 weeks; PET scan prior. Labs from today within normal limits.  # 25 minutes face-to-face with the patient discussing the above plan of care; more than 50% of time spent on prognosis/ natural history; counseling and coordination.

## 2016-06-28 NOTE — Progress Notes (Signed)
Patient here today for follow up.   

## 2016-08-07 ENCOUNTER — Ambulatory Visit
Admission: RE | Admit: 2016-08-07 | Discharge: 2016-08-07 | Disposition: A | Discharge status: Home / Self Care | Payer: BLUE CROSS/BLUE SHIELD | Source: Ambulatory Visit | Attending: Internal Medicine | Admitting: Internal Medicine

## 2016-08-07 DIAGNOSIS — C8583 Other specified types of non-Hodgkin lymphoma, intra-abdominal lymph nodes: Secondary | ICD-10-CM | POA: Diagnosis not present

## 2016-08-07 LAB — GLUCOSE, CAPILLARY: GLUCOSE-CAPILLARY: 82 mg/dL (ref 65–99)

## 2016-08-07 MED ORDER — FLUDEOXYGLUCOSE F - 18 (FDG) INJECTION
12.3400 | Freq: Once | INTRAVENOUS | Status: AC | PRN
  Administered 2016-08-07: 12.34 via INTRAVENOUS

## 2016-08-09 ENCOUNTER — Inpatient Hospital Stay: Payer: BLUE CROSS/BLUE SHIELD | Attending: Internal Medicine | Admitting: Internal Medicine

## 2016-08-09 ENCOUNTER — Inpatient Hospital Stay: Payer: BLUE CROSS/BLUE SHIELD

## 2016-08-09 VITALS — BP 142/75 | HR 65 | Temp 98.0°F | Wt 173.0 lb

## 2016-08-09 DIAGNOSIS — M1990 Unspecified osteoarthritis, unspecified site: Secondary | ICD-10-CM | POA: Insufficient documentation

## 2016-08-09 DIAGNOSIS — K219 Gastro-esophageal reflux disease without esophagitis: Secondary | ICD-10-CM | POA: Diagnosis not present

## 2016-08-09 DIAGNOSIS — E669 Obesity, unspecified: Secondary | ICD-10-CM | POA: Insufficient documentation

## 2016-08-09 DIAGNOSIS — C8333 Diffuse large B-cell lymphoma, intra-abdominal lymph nodes: Secondary | ICD-10-CM | POA: Insufficient documentation

## 2016-08-09 DIAGNOSIS — C8583 Other specified types of non-Hodgkin lymphoma, intra-abdominal lymph nodes: Secondary | ICD-10-CM

## 2016-08-09 DIAGNOSIS — M810 Age-related osteoporosis without current pathological fracture: Secondary | ICD-10-CM | POA: Diagnosis not present

## 2016-08-09 DIAGNOSIS — Z79899 Other long term (current) drug therapy: Secondary | ICD-10-CM | POA: Diagnosis not present

## 2016-08-09 DIAGNOSIS — Z9221 Personal history of antineoplastic chemotherapy: Secondary | ICD-10-CM | POA: Insufficient documentation

## 2016-08-09 DIAGNOSIS — Z95828 Presence of other vascular implants and grafts: Secondary | ICD-10-CM

## 2016-08-09 LAB — CBC WITH DIFFERENTIAL/PLATELET
BASOS ABS: 0 10*3/uL (ref 0–0.1)
BASOS PCT: 1 %
Eosinophils Absolute: 0.1 10*3/uL (ref 0–0.7)
Eosinophils Relative: 2 %
HCT: 36.7 % (ref 35.0–47.0)
Hemoglobin: 12.8 g/dL (ref 12.0–16.0)
Lymphocytes Relative: 17 %
Lymphs Abs: 0.8 10*3/uL — ABNORMAL LOW (ref 1.0–3.6)
MCH: 29.2 pg (ref 26.0–34.0)
MCHC: 34.9 g/dL (ref 32.0–36.0)
MCV: 83.7 fL (ref 80.0–100.0)
MONO ABS: 0.4 10*3/uL (ref 0.2–0.9)
MONOS PCT: 8 %
NEUTROS ABS: 3.6 10*3/uL (ref 1.4–6.5)
NEUTROS PCT: 72 %
PLATELETS: 171 10*3/uL (ref 150–440)
RBC: 4.39 MIL/uL (ref 3.80–5.20)
RDW: 13.5 % (ref 11.5–14.5)
WBC: 5 10*3/uL (ref 3.6–11.0)

## 2016-08-09 LAB — COMPREHENSIVE METABOLIC PANEL
ALBUMIN: 4.4 g/dL (ref 3.5–5.0)
ALT: 17 U/L (ref 14–54)
ANION GAP: 5 (ref 5–15)
AST: 23 U/L (ref 15–41)
Alkaline Phosphatase: 52 U/L (ref 38–126)
BUN: 14 mg/dL (ref 6–20)
CHLORIDE: 106 mmol/L (ref 101–111)
CO2: 26 mmol/L (ref 22–32)
Calcium: 9.3 mg/dL (ref 8.9–10.3)
Creatinine, Ser: 0.84 mg/dL (ref 0.44–1.00)
GFR calc Af Amer: >60 mL/min (ref >60)
GFR calc non Af Amer: >60 mL/min (ref >60)
GLUCOSE: 97 mg/dL (ref 65–99)
POTASSIUM: 3.8 mmol/L (ref 3.5–5.1)
SODIUM: 137 mmol/L (ref 135–145)
TOTAL PROTEIN: 6.8 g/dL (ref 6.5–8.1)
Total Bilirubin: 1.1 mg/dL (ref 0.3–1.2)

## 2016-08-09 LAB — LACTATE DEHYDROGENASE: LDH: 134 U/L (ref 98–192)

## 2016-08-09 MED ORDER — HEPARIN SOD (PORK) LOCK FLUSH 100 UNIT/ML IV SOLN
500.0000 [IU] | Freq: Once | INTRAVENOUS | Status: AC
  Administered 2016-08-09: 500 [IU] via INTRAVENOUS

## 2016-08-09 MED ORDER — SODIUM CHLORIDE 0.9% FLUSH
10.0000 mL | INTRAVENOUS | Status: DC | PRN
  Administered 2016-08-09: 10 mL via INTRAVENOUS
  Filled 2016-08-09: qty 10

## 2016-08-09 NOTE — Assessment & Plan Note (Addendum)
#   Diffuse large B-cell lymphoma ABC subtype/ leukemic cells noted in the blood. Currently, Status post 6 cycles of R CHOP- complete response on PET scan. S/p 4 doses x high dose MXT.   # Clinically no evidence of recurrence. PET MARCH 27th- NED #  Labs from today within normal limits. Long discussion regarding the aggressive nature of her lymphoma; and the fact that aggressive lymphomas if and when coming back usually regarding the first few years. Recommend close surveillance.  # Follow-up labs /visit /port flush in 8 weeks; we'll order a PET scan at that visit.  # I reviewed the blood work- with the patient in detail; also reviewed the imaging independently [as summarized above]; and with the patient in detail.   # 25 minutes face-to-face with the patient discussing the above plan of care; more than 50% of time spent on prognosis/ natural history; counseling and coordination.

## 2016-08-09 NOTE — Progress Notes (Signed)
Patient here for results. No changes since last appointment 

## 2016-08-09 NOTE — Progress Notes (Signed)
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 CT- 1-2 CM LN subpectoral/medistinal/ retro-peritoneal/pelvic/ Right inguinal LN 1.6cm- Bx- DLBCL- ABC; myc-POS; FISH gene re-arragement-NEG. PET- MULTIPLE LN/ Bone involvement; R-CHOP x6- CR'# OCT 2017- PET scan- CR; DEC 22nd 2017- BMBx- "atypical large B cells- <1%  [[facility]; II opinion- Dr.Grover- review of path- not concerning for residual lymphoma]; recommend surviellance  # March 26th  2018PET- NED  # Lumbar puncture- difficult-spinal headache. S/p high dose MXT x4.   # June 2017-Elevated LFTs- valacylovir/diflucan; resolved.   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   Patient appetite is good. No weight loss. No cough or shortness of breath. No chest pain.  No tingling or numbness. No cramps  in the legs. No fevers. No chills. Complains of mild hip pain. Patient is anxious.  REVIEW OF SYSTEMS:  A complete 10 point review of system is done which is negative except mentioned above/history of present illness.   PAST MEDICAL HISTORY :  Past Medical History:  Diagnosis Date  . Arthritis   . Blood dyscrasia   . Cancer ([place])   . Chest pain, unspecified   . Diverticulosis 07/06/15  . Dysrhythmia   . Esophagitis 07/06/15  . Fatigue   . Gastritis 07/06/15  . GERD (gastroesophageal reflux disease)   . 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 Leg cramps   . Lymphoma ([place]) 09/14/15   Monoclonal B cell lymphoma  . Night sweats   . Osteoporosis   . Overweight(278.02)    Obesity  . Palpitations   . Shortness of breath dyspnea   . Tachycardia   . Thrombocytopenia ([place])     PAST SURGICAL HISTORY :   Past Surgical History:  Procedure Laterality Date  . ABDOMINAL HYSTERECTOMY  2005  . BONE MARROW BIOPSY  09/14/15  . CHOLECYSTECTOMY  1997  . COLONOSCOPY  07/05/2014  . ESOPHAGOGASTRODUODENOSCOPY  07/05/2014  . INGUINAL LYMPH NODE BIOPSY Right 10/05/2015   Procedure: INGUINAL LYMPH NODE BIOPSY;  Surgeon: Christene Lye, MD;  Location: [facility] ORS;  Service: General;  Laterality: Right;  . PERIPHERAL VASCULAR CATHETERIZATION N/A 09/27/2015   Procedure: Glori Luis Cath Insertion;  Surgeon: Algernon Huxley, MD;  Location: [place] [facility];  Service: Cardiovascular;  Laterality: N/A;  . PORTACATH PLACEMENT Right     FAMILY HISTORY :   Family History  Problem Relation Age  of Onset  . Heart disease Father     CABG x 4  . Hypertension Mother   . Pancreatic cancer Mother   . Ulcers Mother   . Asthma Mother   . Diabetes Neg Hx     SOCIAL HISTORY:   Social History  Substance Use Topics  . Smoking status: Never Smoker  . Smokeless tobacco: Never Used  . Alcohol use No    ALLERGIES:  has No Known Allergies.  MEDICATIONS:  Current Outpatient Prescriptions  Medication Sig Dispense Refill  .  acetaminophen (TYLENOL 8 HOUR) 650 MG CR tablet Take 650 mg by mouth every 8 (eight) hours as needed for pain.    Marland Kitchen Alum Hydroxide-Mag Carbonate (GAVISCON PO) Take by mouth.    . calcium citrate-vitamin D (CITRACAL+D) 315-200 MG-UNIT tablet Take 2 tablets by mouth daily.    . Cholecalciferol (VITAMIN D-3) 5000 units TABS Take 5,000 Units by mouth daily.    Marland Kitchen glucosamine-chondroitin 500-400 MG tablet Take 1 tablet by mouth 2 (two) times daily.    Marland Kitchen lidocaine-prilocaine (EMLA) cream   2  . Omega-3 Fatty Acids (FISH OIL PO) Take 1 capsule by mouth daily.    . ranitidine (ZANTAC) 150 MG tablet Take 150 mg by mouth 2 (two) times daily.     No current facility-administered medications for this visit.    Facility-Administered Medications Ordered in Other Visits  Medication Dose Route Frequency Provider Last Rate Last Dose  . 0.9 %  sodium chloride infusion   Intravenous Continuous Evlyn Kanner, NP   Stopped at 10/14/15 1312  . methotrexate (50 mg/ml) 6.3 g in sodium chloride 0.9 % 1,000 mL injection   Intravenous Once Cammie Sickle, MD      . sodium chloride flush (NS) 0.9 % injection 10 mL  10 mL Intravenous PRN Cammie Sickle, MD   10 mL at 08/09/16 0917    PHYSICAL EXAMINATION: ECOG PERFORMANCE STATUS: 1 - Symptomatic but completely ambulatory  BP (!) 142/75 (BP Location: Left Arm, Patient Position: Sitting)   Pulse 65   Temp 98 F (36.7 C) (Oral)   Wt 173 lb (78.5 kg)   BMI 31.14 kg/m   Filed Weights   08/09/16 0927  Weight: 173 lb (78.5 kg)    GENERAL: Well-nourished well-developed; Alert, no distress and comfortable.   Accompanied by her husband.  EYES: no pallor or icterus OROPHARYNX: no thrush; good dentition  NECK: supple, no masses felt LYMPH:  no palpable lymphadenopathy in the cervical, axillary or inguinal regions LUNGS: clear to auscultation and  No wheeze or crackles HEART/CVS: regular rate & rhythm and no murmurs; No lower extremity  edema. ABDOMEN:abdomen soft, non-tender and normal bowel sounds Musculoskeletal:no cyanosis of digits and no clubbing  PSYCH: alert & oriented x 3 with fluent speech NEURO: no focal motor/sensory deficits SKIN:  no rashes or significant lesions;  LABORATORY DATA:  I have reviewed the data as listed    Component Value Date/Time   NA 137 08/09/2016 0904   NA 139 06/29/2010 0749   K 3.8 08/09/2016 0904   CL 106 08/09/2016 0904   CO2 26 08/09/2016 0904   GLUCOSE 97 08/09/2016 0904   BUN 14 08/09/2016 0904   BUN 16 06/29/2010 0749   CREATININE 0.84 08/09/2016 0904   CALCIUM 9.3 08/09/2016 0904   PROT 6.8 08/09/2016 0904   ALBUMIN 4.4 08/09/2016 0904   AST 23 08/09/2016 0904   ALT 17 08/09/2016 0904   ALKPHOS 52 08/09/2016 0904  BILITOT 1.1 08/09/2016 0904   GFRNONAA >60 08/09/2016 0904   GFRAA >60 08/09/2016 0904    No results found for: SPEP, UPEP  Lab Results  Component Value Date   WBC 5.0 08/09/2016   NEUTROABS 3.6 08/09/2016   HGB 12.8 08/09/2016   HCT 36.7 08/09/2016   MCV 83.7 08/09/2016   PLT 171 08/09/2016      Chemistry      Component Value Date/Time   NA 137 08/09/2016 0904   NA 139 06/29/2010 0749   K 3.8 08/09/2016 0904   CL 106 08/09/2016 0904   CO2 26 08/09/2016 0904   BUN 14 08/09/2016 0904   BUN 16 06/29/2010 0749   CREATININE 0.84 08/09/2016 0904   GLU 93 06/29/2010 0749      Component Value Date/Time   CALCIUM 9.3 08/09/2016 0904   ALKPHOS 52 08/09/2016 0904   AST 23 08/09/2016 0904   ALT 17 08/09/2016 0904   BILITOT 1.1 08/09/2016 0904       RADIOGRAPHIC STUDIES: I have personally reviewed the radiological images as listed and agreed with the findings in the report. No results found.   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 Currently, Status post 6 cycles of R CHOP- complete response on PET scan. S/p 4 doses x high  dose MXT.   # Clinically no evidence of recurrence. PET MARCH 27th- NED #  Labs from today within normal limits. Long discussion regarding the aggressive nature of her lymphoma; and the fact that aggressive lymphomas if and when coming back usually regarding the first few years. Recommend close surveillance.  # Follow-up labs /visit /port flush in 8 weeks; we'll order a PET scan at that visit.  # I reviewed the blood work- with the patient in detail; also reviewed the imaging independently [as summarized above]; and with the patient in detail.   # 25 minutes face-to-face with the patient discussing the above plan of care; more than 50% of time spent on prognosis/ natural history; counseling and coordination.  Orders Placed This Encounter  Procedures  . CBC with Differential    Standing Status:   Future    Standing Expiration Date:   08/09/2017  . Comprehensive metabolic panel    Standing Status:   Future    Standing Expiration Date:   08/09/2017  . Lactate dehydrogenase    Standing Status:   Future    Standing Expiration Date:   08/09/2017     Cammie Sickle, MD 08/09/2016 12:31 PM

## 2016-08-14 ENCOUNTER — Telehealth: Payer: Self-pay | Admitting: Internal Medicine

## 2016-08-14 DIAGNOSIS — Z1239 Encounter for other screening for malignant neoplasm of breast: Secondary | ICD-10-CM

## 2016-08-14 NOTE — Telephone Encounter (Signed)
Yes, you do not need to ask  For mammograms.  Just order please and save me the time

## 2016-08-14 NOTE — Telephone Encounter (Signed)
Is it ok to go ahead and order?

## 2016-08-14 NOTE — Telephone Encounter (Signed)
Order has been put in and pt has been notified. She stated that she would call [place] and schedule her appt.

## 2016-08-14 NOTE — Telephone Encounter (Signed)
Pt called to make an appt for a physical on 5/24. Pt would liek an order to be put in for her mammogram so that she can go ahead and scheduled that. Please advise, thank you!  Call pt @ [phone]

## 2016-09-25 ENCOUNTER — Ambulatory Visit
Admission: RE | Admit: 2016-09-25 | Discharge: 2016-09-25 | Disposition: A | Discharge status: Home / Self Care | Payer: BLUE CROSS/BLUE SHIELD | Source: Ambulatory Visit | Attending: Internal Medicine | Admitting: Internal Medicine

## 2016-09-25 DIAGNOSIS — Z1239 Encounter for other screening for malignant neoplasm of breast: Secondary | ICD-10-CM

## 2016-09-25 DIAGNOSIS — Z1231 Encounter for screening mammogram for malignant neoplasm of breast: Secondary | ICD-10-CM | POA: Diagnosis not present

## 2016-09-27 ENCOUNTER — Encounter: Payer: Self-pay | Admitting: Internal Medicine

## 2016-10-04 ENCOUNTER — Ambulatory Visit (INDEPENDENT_AMBULATORY_CARE_PROVIDER_SITE_OTHER): Payer: BLUE CROSS/BLUE SHIELD | Admitting: Internal Medicine

## 2016-10-04 ENCOUNTER — Encounter: Payer: Self-pay | Admitting: Internal Medicine

## 2016-10-04 VITALS — BP 126/78 | HR 69 | Temp 98.1°F | Resp 15 | Ht 62.5 in | Wt 175.4 lb

## 2016-10-04 DIAGNOSIS — E7800 Pure hypercholesterolemia, unspecified: Secondary | ICD-10-CM

## 2016-10-04 DIAGNOSIS — E559 Vitamin D deficiency, unspecified: Secondary | ICD-10-CM

## 2016-10-04 DIAGNOSIS — R5383 Other fatigue: Secondary | ICD-10-CM

## 2016-10-04 DIAGNOSIS — C8338 Diffuse large B-cell lymphoma, lymph nodes of multiple sites: Secondary | ICD-10-CM

## 2016-10-04 DIAGNOSIS — Z0000 Encounter for general adult medical examination without abnormal findings: Secondary | ICD-10-CM | POA: Diagnosis not present

## 2016-10-04 LAB — LIPID PANEL
CHOL/HDL RATIO: 3
Cholesterol: 216 mg/dL — ABNORMAL HIGH (ref 0–200)
HDL: 68.2 mg/dL (ref >39.00)
LDL Cholesterol: 122 mg/dL — ABNORMAL HIGH (ref 0–99)
NONHDL: 147.82
Triglycerides: 127 mg/dL (ref 0.0–149.0)
VLDL: 25.4 mg/dL (ref 0.0–40.0)

## 2016-10-04 LAB — TSH: TSH: 1.68 u[IU]/mL (ref 0.35–4.50)

## 2016-10-04 LAB — VITAMIN D 25 HYDROXY (VIT D DEFICIENCY, FRACTURES): VITD: 43.02 ng/mL (ref 30.00–100.00)

## 2016-10-04 NOTE — Patient Instructions (Addendum)
1500 mg daily turmeric supple,ments may help your joint pain   WE WILL REPEAT YOUR last pap smear NEXT YEAR!   Health Maintenance for Postmenopausal Women Menopause is a normal process in which your reproductive ability comes to an end. This process happens gradually over a span of months to years, usually between the ages of 11 and 30. Menopause is complete when you have missed 12 consecutive menstrual periods. It is important to talk with your health care provider about some of the most common conditions that affect postmenopausal women, such as heart disease, cancer, and bone loss (osteoporosis). Adopting a healthy lifestyle and getting preventive care can help to promote your health and wellness. Those actions can also lower your chances of developing some of these common conditions. What should I know about menopause? During menopause, you may experience a number of symptoms, such as:  Moderate-to-severe hot flashes.  Night sweats.  Decrease in sex drive.  Mood swings.  Headaches.  Tiredness.  Irritability.  Memory problems.  Insomnia. Choosing to treat or not to treat menopausal changes is an individual decision that you make with your health care provider. What should I know about hormone replacement therapy and supplements? Hormone therapy products are effective for treating symptoms that are associated with menopause, such as hot flashes and night sweats. Hormone replacement carries certain risks, especially as you become older. If you are thinking about using estrogen or estrogen with progestin treatments, discuss the benefits and risks with your health care provider. What should I know about heart disease and stroke? Heart disease, heart attack, and stroke become more likely as you age. This may be due, in part, to the hormonal changes that your body experiences during menopause. These can affect how your body processes dietary fats, triglycerides, and cholesterol. Heart  attack and stroke are both medical emergencies. There are many things that you can do to help prevent heart disease and stroke:  Have your blood pressure checked at least every 1-2 years. High blood pressure causes heart disease and increases the risk of stroke.  If you are 22-27 years old, ask your health care provider if you should take aspirin to prevent a heart attack or a stroke.  Do not use any tobacco products, including cigarettes, chewing tobacco, or electronic cigarettes. If you need help quitting, ask your health care provider.  It is important to eat a healthy diet and maintain a healthy weight.  Be sure to include plenty of vegetables, fruits, low-fat dairy products, and lean protein.  Avoid eating foods that are high in solid fats, added sugars, or salt (sodium).  Get regular exercise. This is one of the most important things that you can do for your health.  Try to exercise for at least 150 minutes each week. The type of exercise that you do should increase your heart rate and make you sweat. This is known as moderate-intensity exercise.  Try to do strengthening exercises at least twice each week. Do these in addition to the moderate-intensity exercise.  Know your numbers.Ask your health care provider to check your cholesterol and your blood glucose. Continue to have your blood tested as directed by your health care provider. What should I know about cancer screening? There are several types of cancer. Take the following steps to reduce your risk and to catch any cancer development as early as possible. Breast Cancer  Practice breast self-awareness.  This means understanding how your breasts normally appear and feel.  It also means doing  regular breast self-exams. Let your health care provider know about any changes, no matter how small.  If you are 25 or older, have a clinician do a breast exam (clinical breast exam or CBE) every year. Depending on your age, family  history, and medical history, it may be recommended that you also have a yearly breast X-ray (mammogram).  If you have a family history of breast cancer, talk with your health care provider about genetic screening.  If you are at high risk for breast cancer, talk with your health care provider about having an MRI and a mammogram every year.  Breast cancer (BRCA) gene test is recommended for women who have family members with BRCA-related cancers. Results of the assessment will determine the need for genetic counseling and BRCA1 and for BRCA2 testing. BRCA-related cancers include these types:  Breast. This occurs in males or females.  Ovarian.  Tubal. This may also be called fallopian tube cancer.  Cancer of the abdominal or pelvic lining (peritoneal cancer).  Prostate.  Pancreatic. Cervical, Uterine, and Ovarian Cancer  Your health care provider may recommend that you be screened regularly for cancer of the pelvic organs. These include your ovaries, uterus, and vagina. This screening involves a pelvic exam, which includes checking for microscopic changes to the surface of your cervix (Pap test).  For women ages 21-65, health care providers may recommend a pelvic exam and a Pap test every three years. For women ages 4-65, they may recommend the Pap test and pelvic exam, combined with testing for human papilloma virus (HPV), every five years. Some types of HPV increase your risk of cervical cancer. Testing for HPV may also be done on women of any age who have unclear Pap test results.  Other health care providers may not recommend any screening for nonpregnant women who are considered low risk for pelvic cancer and have no symptoms. Ask your health care provider if a screening pelvic exam is right for you.  If you have had past treatment for cervical cancer or a condition that could lead to cancer, you need Pap tests and screening for cancer for at least 20 years after your treatment. If Pap  tests have been discontinued for you, your risk factors (such as having a new sexual partner) need to be reassessed to determine if you should start having screenings again. Some women have medical problems that increase the chance of getting cervical cancer. In these cases, your health care provider may recommend that you have screening and Pap tests more often.  If you have a family history of uterine cancer or ovarian cancer, talk with your health care provider about genetic screening.  If you have vaginal bleeding after reaching menopause, tell your health care provider.  There are currently no reliable tests available to screen for ovarian cancer. Lung Cancer  Lung cancer screening is recommended for adults 65-70 years old who are at high risk for lung cancer because of a history of smoking. A yearly low-dose CT scan of the lungs is recommended if you:  Currently smoke.  Have a history of at least 30 pack-years of smoking and you currently smoke or have quit within the past 15 years. A pack-year is smoking an average of one pack of cigarettes per day for one year. Yearly screening should:  Continue until it has been 15 years since you quit.  Stop if you develop a health problem that would prevent you from having lung cancer treatment. Colorectal Cancer  This  type of cancer can be detected and can often be prevented.  Routine colorectal cancer screening usually begins at age 25 and continues through age 77.  If you have risk factors for colon cancer, your health care provider may recommend that you be screened at an earlier age.  If you have a family history of colorectal cancer, talk with your health care provider about genetic screening.  Your health care provider may also recommend using home test kits to check for hidden blood in your stool.  A small camera at the end of a tube can be used to examine your colon directly (sigmoidoscopy or colonoscopy). This is done to check for  the earliest forms of colorectal cancer.  Direct examination of the colon should be repeated every 5-10 years until age 72. However, if early forms of precancerous polyps or small growths are found or if you have a family history or genetic risk for colorectal cancer, you may need to be screened more often. Skin Cancer  Check your skin from head to toe regularly.  Monitor any moles. Be sure to tell your health care provider:  About any new moles or changes in moles, especially if there is a change in a mole's shape or color.  If you have a mole that is larger than the size of a pencil eraser.  If any of your family members has a history of skin cancer, especially at a young age, talk with your health care provider about genetic screening.  Always use sunscreen. Apply sunscreen liberally and repeatedly throughout the day.  Whenever you are outside, protect yourself by wearing long sleeves, pants, a wide-brimmed hat, and sunglasses. What should I know about osteoporosis? Osteoporosis is a condition in which bone destruction happens more quickly than new bone creation. After menopause, you may be at an increased risk for osteoporosis. To help prevent osteoporosis or the bone fractures that can happen because of osteoporosis, the following is recommended:  If you are 65-37 years old, get at least 1,000 mg of calcium and at least 600 mg of vitamin D per day.  If you are older than age 14 but younger than age 39, get at least 1,200 mg of calcium and at least 600 mg of vitamin D per day.  If you are older than age 59, get at least 1,200 mg of calcium and at least 800 mg of vitamin D per day. Smoking and excessive alcohol intake increase the risk of osteoporosis. Eat foods that are rich in calcium and vitamin D, and do weight-bearing exercises several times each week as directed by your health care provider. What should I know about how menopause affects my mental health? Depression may occur at  any age, but it is more common as you become older. Common symptoms of depression include:  Low or sad mood.  Changes in sleep patterns.  Changes in appetite or eating patterns.  Feeling an overall lack of motivation or enjoyment of activities that you previously enjoyed.  Frequent crying spells. Talk with your health care provider if you think that you are experiencing depression. What should I know about immunizations? It is important that you get and maintain your immunizations. These include:  Tetanus, diphtheria, and pertussis (Tdap) booster vaccine.  Influenza every year before the flu season begins.  Pneumonia vaccine.  Shingles vaccine. Your health care provider may also recommend other immunizations. This information is not intended to replace advice given to you by your health care provider. Make sure  you discuss any questions you have with your health care provider. Document Released: 06/22/2005 Document Revised: 11/18/2015 Document Reviewed: 02/01/2015 Elsevier Interactive Patient Education  2017 [employer].

## 2016-10-04 NOTE — Progress Notes (Signed)
Patient ID: Stacy Murillo, female    DOB: 1956-03-11  Age: 61 y.o. MRN: [id number]  The patient is here for annual PHYSICAL examination and management of other chronic and acute problems.  She was treated for AGGRESSIVE LARGE CELL LYMPHOMA WITH CHOP in   2017.  There has been no  RECURRENCEPET SCAN MARCH 2018 MAMMOGRAM  NORMAL MAY 2018 COLONOSCOPY [place] 2016  Normal   PAP NORMAL 2013   The risk factors are reflected in the social history.  The roster of all physicians providing medical care to patient - is listed in the Snapshot section of the chart.  Activities of daily living:  The patient is 100% independent in all ADLs: dressing, toileting, feeding as well as independent mobility  Home safety : The patient has smoke detectors in the home. They wear seatbelts.  There are no firearms at home. There is no violence in the home.   There is no risks for hepatitis, STDs or HIV. There is no   history of blood transfusion. They have no travel history to infectious disease endemic areas of the world.  The patient has seen their dentist in the last six month. They have seen their eye doctor in the last year. They admit to slight hearing difficulty with regard to whispered voices and some television programs.  They have deferred audiologic testing in the last year.  They do not  have excessive sun exposure. Discussed the need for sun protection: hats, long sleeves and use of sunscreen if there is significant sun exposure.   Diet: the importance of a healthy diet is discussed. They do have a healthy diet.  The benefits of regular aerobic exercise were discussed. She walks 4 times per week ,  20 minutes.   Depression screen: there are no signs or vegative symptoms of depression- irritability, change in appetite, anhedonia, sadness/tearfullness.  Cognitive assessment: the patient manages all their financial and personal affairs and is actively engaged. They could relate day,date,year and events;  recalled 2/3 objects at 3 minutes; performed clock-face test normally.  The following portions of the patient's history were reviewed and updated as appropriate: allergies, current medications, past family history, past medical history,  past surgical history, past social history  and problem list.  Visual acuity was not assessed per patient preference since she has regular follow up with her ophthalmologist. Hearing and body mass index were assessed and reviewed.   During the course of the visit the patient was educated and counseled about appropriate screening and preventive services including : fall prevention , diabetes screening, nutrition counseling, colorectal cancer screening, and recommended immunizations.    CC: The primary encounter diagnosis was Pure hypercholesterolemia. Diagnoses of Vitamin D deficiency, Fatigue, unspecified type, Routine general medical examination at a health care facility, and Diffuse large B-cell lymphoma of lymph nodes of multiple regions [name]) were also pertinent to this visit.  WALKING  DOG 3 TIMES DAILY 20-30 MINUTES for exercise, denies dyspnea  Sleeping well with 1300 mg tylenol night due to bilateral hip pain.  Do not hurt with walking Some knee pain with walking,  Has had cortisone  injections in left knee.  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   She has a  past surgical history that includes Abdominal hysterectomy (2005); Bone marrow biopsy (09/14/15); Colonoscopy (07/05/2014); Esophagogastroduodenoscopy (07/05/2014); Portacath placement  (Right); Cardiac catheterization (N/A, 09/27/2015); Cholecystectomy (1997); and Inguinal lymph node biopsy (Right, 10/05/2015).   Her family history includes Asthma in her mother; Heart disease in her father; Hypertension in her mother; Multiple myeloma in her father; Pancreatic cancer in her mother; Ulcers in her mother.She reports that she has never smoked. She has never used smokeless tobacco. She reports that she does not drink alcohol or use drugs.  Outpatient Medications Prior to Visit  Medication Sig Dispense Refill  . acetaminophen (TYLENOL 8 HOUR) 650 MG CR tablet Take 650 mg by mouth every 8 (eight) hours as needed for pain.    Marland Kitchen Alum Hydroxide-Mag Carbonate (GAVISCON PO) Take by mouth.    . lidocaine-prilocaine (EMLA) cream   2  . ranitidine (ZANTAC) 150 MG tablet Take 150 mg by mouth 2 (two) times daily.    . calcium citrate-vitamin D (CITRACAL+D) 315-200 MG-UNIT tablet Take 2 tablets by mouth daily.    . Cholecalciferol (VITAMIN D-3) 5000 units TABS Take 5,000 Units by mouth daily.    Marland Kitchen glucosamine-chondroitin 500-400 MG tablet Take 1 tablet by mouth 2 (two) times daily.    . Omega-3 Fatty Acids (FISH OIL PO) Take 1 capsule by mouth daily.     Facility-Administered Medications Prior to Visit  Medication Dose Route Frequency Provider Last Rate Last Dose  . 0.9 %  sodium chloride infusion   Intravenous Continuous Evlyn Kanner, NP   Stopped at 10/14/15 1312  . methotrexate (50 mg/ml) 6.3 g in sodium chloride 0.9 % 1,000 mL injection   Intravenous Once Cammie Sickle, MD        Review of Systems   Patient denies headache, fevers, malaise, unintentional weight loss, skin rash, eye pain, sinus congestion and sinus pain, sore throat, dysphagia,  hemoptysis , cough, dyspnea, wheezing, chest pain, palpitations, orthopnea, edema, abdominal pain, nausea, melena, diarrhea, constipation, flank pain, dysuria, hematuria, urinary  Frequency, nocturia, numbness, tingling, seizures,   Focal weakness, Loss of consciousness,  Tremor, insomnia, depression, anxiety, and suicidal ideation.      Objective:  BP 126/78 (BP Location: Left Arm, Patient Position: Sitting, Cuff Size: Normal)   Pulse 69   Temp 98.1 F (36.7 C) (Oral)   Resp 15   Ht 5' 2.5" (1.588 m)   Wt 175 lb 6.4 oz (79.6 kg)   SpO2 98%   BMI 31.57 kg/m   Physical Exam   General appearance: alert, cooperative and appears stated age Ears: normal TM's and external ear canals both ears Throat: lips, mucosa, and tongue normal; teeth and gums normal Neck: no adenopathy, no carotid bruit, supple, symmetrical, trachea midline and thyroid not enlarged, symmetric, no tenderness/mass/nodules Back: symmetric, no curvature. ROM normal. No CVA tenderness. Lungs: clear to auscultation bilaterally Heart: regular rate and rhythm, S1, S2 normal, no murmur, click, rub or gallop Abdomen: soft, non-tender; bowel sounds normal; no masses,  no organomegaly Pulses: 2+ and symmetric Skin: Skin color, texture, turgor normal. No rashes or lesions Lymph nodes: Cervical, supraclavicular, and axillary nodes normal.   Assessment & Plan:   Problem List Items Addressed This Visit    Routine general medical examination at a health care facility    Annual comprehensive preventive exam was done as well as an evaluation and management of chronic conditions .  During the course of the visit the patient was educated and counseled about appropriate screening and preventive  services including :  diabetes screening, lipid analysis with projected  10 year  risk for CAD , nutrition counseling, breast, cervical and colorectal cancer screening, and recommended immunizations.  Printed recommendations for health maintenance screenings was given      Diffuse large B-cell lymphoma of lymph nodes of multiple regions ([place])    In remission post CHOP therapy        Other Visit Diagnoses    Pure hypercholesterolemia    -  Primary   Relevant Orders    Lipid panel (Completed)   Vitamin D deficiency       Relevant Orders   VITAMIN D 25 Hydroxy (Vit-D Deficiency, Fractures) (Completed)   Fatigue, unspecified type       Relevant Orders   TSH (Completed)      I have discontinued Ms. Bentsen's Omega-3 Fatty Acids (FISH OIL PO), glucosamine-chondroitin, Vitamin D-3, and calcium citrate-vitamin D. I am also having her maintain her ranitidine, acetaminophen, lidocaine-prilocaine, and Alum Hydroxide-Mag Carbonate (GAVISCON PO).  No orders of the defined types were placed in this encounter.   Medications Discontinued During This Encounter  Medication Reason  . calcium citrate-vitamin D (CITRACAL+D) 315-200 MG-UNIT tablet Patient has not taken in last 30 days  . Cholecalciferol (VITAMIN D-3) 5000 units TABS Patient has not taken in last 30 days  . glucosamine-chondroitin 500-400 MG tablet Patient has not taken in last 30 days  . Omega-3 Fatty Acids (FISH OIL PO) Patient has not taken in last 30 days    Follow-up: No Follow-up on file.   Crecencio Mc, MD

## 2016-10-05 ENCOUNTER — Inpatient Hospital Stay: Payer: BLUE CROSS/BLUE SHIELD | Attending: Internal Medicine

## 2016-10-05 ENCOUNTER — Inpatient Hospital Stay (HOSPITAL_BASED_OUTPATIENT_CLINIC_OR_DEPARTMENT_OTHER): Payer: BLUE CROSS/BLUE SHIELD | Admitting: Internal Medicine

## 2016-10-05 VITALS — BP 135/76 | HR 64 | Temp 98.3°F | Wt 175.4 lb

## 2016-10-05 DIAGNOSIS — Z9221 Personal history of antineoplastic chemotherapy: Secondary | ICD-10-CM

## 2016-10-05 DIAGNOSIS — C8338 Diffuse large B-cell lymphoma, lymph nodes of multiple sites: Secondary | ICD-10-CM | POA: Insufficient documentation

## 2016-10-05 DIAGNOSIS — K219 Gastro-esophageal reflux disease without esophagitis: Secondary | ICD-10-CM | POA: Insufficient documentation

## 2016-10-05 DIAGNOSIS — Z79899 Other long term (current) drug therapy: Secondary | ICD-10-CM | POA: Diagnosis not present

## 2016-10-05 DIAGNOSIS — C8583 Other specified types of non-Hodgkin lymphoma, intra-abdominal lymph nodes: Secondary | ICD-10-CM

## 2016-10-05 DIAGNOSIS — E669 Obesity, unspecified: Secondary | ICD-10-CM | POA: Insufficient documentation

## 2016-10-05 DIAGNOSIS — Z95828 Presence of other vascular implants and grafts: Secondary | ICD-10-CM

## 2016-10-05 LAB — COMPREHENSIVE METABOLIC PANEL
ALK PHOS: 57 U/L (ref 38–126)
ALT: 20 U/L (ref 14–54)
AST: 31 U/L (ref 15–41)
Albumin: 4.3 g/dL (ref 3.5–5.0)
Anion gap: 4 — ABNORMAL LOW (ref 5–15)
BUN: 18 mg/dL (ref 6–20)
CALCIUM: 9.3 mg/dL (ref 8.9–10.3)
CO2: 28 mmol/L (ref 22–32)
CREATININE: 0.89 mg/dL (ref 0.44–1.00)
Chloride: 105 mmol/L (ref 101–111)
GFR calc non Af Amer: >60 mL/min (ref >60)
Glucose, Bld: 125 mg/dL — ABNORMAL HIGH (ref 65–99)
Potassium: 3.8 mmol/L (ref 3.5–5.1)
SODIUM: 137 mmol/L (ref 135–145)
Total Bilirubin: 0.7 mg/dL (ref 0.3–1.2)
Total Protein: 6.7 g/dL (ref 6.5–8.1)

## 2016-10-05 LAB — CBC WITH DIFFERENTIAL/PLATELET
Basophils Absolute: 0 10*3/uL (ref 0–0.1)
Basophils Relative: 1 %
Eosinophils Absolute: 0.1 10*3/uL (ref 0–0.7)
Eosinophils Relative: 2 %
HEMATOCRIT: 36.4 % (ref 35.0–47.0)
HEMOGLOBIN: 12.7 g/dL (ref 12.0–16.0)
LYMPHS PCT: 20 %
Lymphs Abs: 1 10*3/uL (ref 1.0–3.6)
MCH: 29.5 pg (ref 26.0–34.0)
MCHC: 35 g/dL (ref 32.0–36.0)
MCV: 84.4 fL (ref 80.0–100.0)
Monocytes Absolute: 0.4 10*3/uL (ref 0.2–0.9)
Monocytes Relative: 8 %
NEUTROS ABS: 3.6 10*3/uL (ref 1.4–6.5)
NEUTROS PCT: 71 %
Platelets: 165 10*3/uL (ref 150–440)
RBC: 4.32 MIL/uL (ref 3.80–5.20)
RDW: 12.9 % (ref 11.5–14.5)
WBC: 5.1 10*3/uL (ref 3.6–11.0)

## 2016-10-05 LAB — LACTATE DEHYDROGENASE: LDH: 144 U/L (ref 98–192)

## 2016-10-05 MED ORDER — HEPARIN SOD (PORK) LOCK FLUSH 100 UNIT/ML IV SOLN
500.0000 [IU] | Freq: Once | INTRAVENOUS | Status: AC
  Administered 2016-10-05: 500 [IU] via INTRAVENOUS

## 2016-10-05 MED ORDER — SODIUM CHLORIDE 0.9% FLUSH
10.0000 mL | INTRAVENOUS | Status: AC | PRN
  Administered 2016-10-05: 10 mL via INTRAVENOUS
  Filled 2016-10-05: qty 10

## 2016-10-05 NOTE — Progress Notes (Signed)
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 CT- 1-2 CM LN subpectoral/medistinal/ retro-peritoneal/pelvic/ Right inguinal LN 1.6cm- Bx- DLBCL- ABC; myc-POS; FISH gene re-arragement-NEG. PET- MULTIPLE LN/ Bone involvement; R-CHOP x6- CR'# OCT 2017- PET scan- CR; DEC 22nd 2017- BMBx- "atypical large B cells- <1%  [[facility]; II opinion- Dr.Grover- review of path- not concerning for residual lymphoma]; recommend surviellance  # March 26th  2018PET- NED  # Lumbar puncture- difficult-spinal headache. S/p high dose MXT x4.   # June 2017-Elevated LFTs- valacylovir/diflucan; resolved.   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  Patient appetite is good. No weight loss. No cough or shortness of breath. No chest pain.  No tingling or numbness. No cramps in the legs. No fevers. No chills.  Complains of mild hip pain. Patient is anxious.  REVIEW OF SYSTEMS:  A complete 10 point review of system is done which is negative except mentioned above/history of present illness.   PAST MEDICAL HISTORY :  Past Medical History:  Diagnosis Date  . Arthritis   . Blood dyscrasia   . Cancer ([place])   . Chest pain, unspecified   . Diverticulosis 07/06/15  . Dysrhythmia   . Esophagitis 07/06/15  . Fatigue   . Gastritis 07/06/15  . GERD (gastroesophageal reflux disease)   . 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 Leg cramps   . Lymphoma ([place]) 09/14/15   Monoclonal B cell lymphoma  . Night sweats   . Osteoporosis   . Overweight(278.02)    Obesity  . Palpitations   . Shortness of breath dyspnea   . Tachycardia   . Thrombocytopenia ([place])     PAST SURGICAL HISTORY :   Past Surgical History:  Procedure Laterality Date  . ABDOMINAL HYSTERECTOMY  2005  . BONE MARROW BIOPSY  09/14/15  . CHOLECYSTECTOMY  1997  . COLONOSCOPY  07/05/2014  . ESOPHAGOGASTRODUODENOSCOPY  07/05/2014  . INGUINAL LYMPH NODE BIOPSY Right 10/05/2015   Procedure: INGUINAL LYMPH NODE BIOPSY;  Surgeon: Christene Lye, MD;  Location: [facility] ORS;  Service: General;  Laterality: Right;  . PERIPHERAL VASCULAR CATHETERIZATION N/A 09/27/2015   Procedure: Glori Luis Cath Insertion;  Surgeon: Algernon Huxley, MD;  Location: [place] [facility];  Service: Cardiovascular;  Laterality: N/A;  . PORTACATH PLACEMENT Right     FAMILY HISTORY :   Family History  Problem Relation Age of Onset  . Heart disease Father  CABG x 4  . Multiple myeloma Father   . Hypertension Mother   . Pancreatic cancer Mother   . Ulcers Mother   . Asthma Mother   . Diabetes Neg Hx   . Breast cancer Neg Hx     SOCIAL HISTORY:   Social History  Substance Use Topics  . Smoking status: Never Smoker  . Smokeless tobacco: Never Used  . Alcohol use No    ALLERGIES:  has No Known Allergies.  MEDICATIONS:  Current Outpatient Prescriptions  Medication  Sig Dispense Refill  . acetaminophen (TYLENOL 8 HOUR) 650 MG CR tablet Take 650 mg by mouth every 8 (eight) hours as needed for pain.    Marland Kitchen Alum Hydroxide-Mag Carbonate (GAVISCON PO) Take by mouth.    . lidocaine-prilocaine (EMLA) cream   2  . ranitidine (ZANTAC) 150 MG tablet Take 150 mg by mouth 2 (two) times daily.     No current facility-administered medications for this visit.    Facility-Administered Medications Ordered in Other Visits  Medication Dose Route Frequency Provider Last Rate Last Dose  . 0.9 %  sodium chloride infusion   Intravenous Continuous Evlyn Kanner, NP   Stopped at 10/14/15 1312  . methotrexate (50 mg/ml) 6.3 g in sodium chloride 0.9 % 1,000 mL injection   Intravenous Once Charlaine Dalton R, MD      . sodium chloride flush (NS) 0.9 % injection 10 mL  10 mL Intravenous PRN Cammie Sickle, MD   10 mL at 10/05/16 1400    PHYSICAL EXAMINATION: ECOG PERFORMANCE STATUS: 1 - Symptomatic but completely ambulatory  BP 135/76 (BP Location: Right Arm, Patient Position: Sitting)   Pulse 64   Temp 98.3 F (36.8 C) (Tympanic)   Wt 175 lb 6.4 oz (79.6 kg)   BMI 31.57 kg/m   Filed Weights   10/05/16 1414  Weight: 175 lb 6.4 oz (79.6 kg)    GENERAL: Well-nourished well-developed; Alert, no distress and comfortable.   Accompanied by her husband.  EYES: no pallor or icterus OROPHARYNX: no thrush; good dentition  NECK: supple, no masses felt LYMPH:  no palpable lymphadenopathy in the cervical, axillary or inguinal regions LUNGS: clear to auscultation and  No wheeze or crackles HEART/CVS: regular rate & rhythm and no murmurs; No lower extremity edema. ABDOMEN:abdomen soft, non-tender and normal bowel sounds Musculoskeletal:no cyanosis of digits and no clubbing  PSYCH: alert & oriented x 3 with fluent speech NEURO: no focal motor/sensory deficits SKIN:  no rashes or significant lesions;  LABORATORY DATA:  I have reviewed the data as listed    Component  Value Date/Time   NA 137 10/05/2016 1345   NA 139 06/29/2010 0749   K 3.8 10/05/2016 1345   CL 105 10/05/2016 1345   CO2 28 10/05/2016 1345   GLUCOSE 125 (H) 10/05/2016 1345   BUN 18 10/05/2016 1345   BUN 16 06/29/2010 0749   CREATININE 0.89 10/05/2016 1345   CALCIUM 9.3 10/05/2016 1345   PROT 6.7 10/05/2016 1345   ALBUMIN 4.3 10/05/2016 1345   AST 31 10/05/2016 1345   ALT 20 10/05/2016 1345   ALKPHOS 57 10/05/2016 1345   BILITOT 0.7 10/05/2016 1345   GFRNONAA >60 10/05/2016 1345   GFRAA >60 10/05/2016 1345    No results found for: SPEP, UPEP  Lab Results  Component Value Date   WBC 5.1 10/05/2016   NEUTROABS 3.6 10/05/2016   HGB 12.7 10/05/2016   HCT 36.4 10/05/2016   MCV 84.4 10/05/2016  PLT 165 10/05/2016      Chemistry      Component Value Date/Time   NA 137 10/05/2016 1345   NA 139 06/29/2010 0749   K 3.8 10/05/2016 1345   CL 105 10/05/2016 1345   CO2 28 10/05/2016 1345   BUN 18 10/05/2016 1345   BUN 16 06/29/2010 0749   CREATININE 0.89 10/05/2016 1345   GLU 93 06/29/2010 0749      Component Value Date/Time   CALCIUM 9.3 10/05/2016 1345   ALKPHOS 57 10/05/2016 1345   AST 31 10/05/2016 1345   ALT 20 10/05/2016 1345   BILITOT 0.7 10/05/2016 1345       RADIOGRAPHIC STUDIES: I have personally reviewed the radiological images as listed and agreed with the findings in the report. No results found.   ASSESSMENT & PLAN:  Diffuse large B-cell lymphoma of lymph nodes of multiple regions (HCC) # Diffuse large B-cell lymphoma ABC subtype/ leukemic cells noted in the blood. Currently, Status post 6 cycles of R CHOP- complete response on PET scan. S/p 4 doses x high dose MXT [finished November 2017]. PET MARCH 27th- NED. Clinically no evidence of recurrence. Labs within normal limits.  # Follow-up labs /visit /port flush in 32month; PET scan prior.[ Discussed authorization issues].   Orders Placed This Encounter  Procedures  . NM PET Image Restag (PS) Skull  Base To Thigh    Standing Status:   Future    Standing Expiration Date:   12/05/2017    Order Specific Question:   Reason for Exam (SYMPTOM  OR DIAGNOSIS REQUIRED)    Answer:   lymphoma    Order Specific Question:   Preferred imaging location?    Answer:   [facility]  . CBC with Differential/Platelet    Standing Status:   Future    Standing Expiration Date:   10/05/2017  . Comprehensive metabolic panel    Standing Status:   Future    Standing Expiration Date:   10/05/2017  . Lactate dehydrogenase    Standing Status:   Future    Standing Expiration Date:   10/05/2017     GCammie Sickle MD 10/06/2016 5:39 PM

## 2016-10-05 NOTE — Assessment & Plan Note (Addendum)
#   Diffuse large B-cell lymphoma ABC subtype/ leukemic cells noted in the blood. Currently, Status post 6 cycles of R CHOP- complete response on PET scan. S/p 4 doses x high dose MXT [finished November 2017]. PET MARCH 27th- NED. Clinically no evidence of recurrence. Labs within normal limits.  # Follow-up labs /visit /port flush in 54months; PET scan prior.[ Discussed authorization issues].

## 2016-10-05 NOTE — Progress Notes (Signed)
Patient denies pain or discomfort at this time, vitals stable and documented.  Patient ambulates independently without assistance, brought to exam room 15, accompanied by her husband

## 2016-10-06 NOTE — Assessment & Plan Note (Signed)
In remission post CHOP therapy

## 2016-10-06 NOTE — Assessment & Plan Note (Signed)
Annual comprehensive preventive exam was done as well as an evaluation and management of chronic conditions .  During the course of the visit the patient was educated and counseled about appropriate screening and preventive services including :  diabetes screening, lipid analysis with projected  10 year  risk for CAD , nutrition counseling, breast, cervical and colorectal cancer screening, and recommended immunizations.  Printed recommendations for health maintenance screenings was given 

## 2016-10-08 ENCOUNTER — Encounter: Payer: Self-pay | Admitting: Internal Medicine

## 2016-12-03 ENCOUNTER — Other Ambulatory Visit: Payer: Self-pay | Admitting: Internal Medicine

## 2016-12-03 ENCOUNTER — Ambulatory Visit: Payer: BLUE CROSS/BLUE SHIELD

## 2016-12-03 DIAGNOSIS — C8338 Diffuse large B-cell lymphoma, lymph nodes of multiple sites: Secondary | ICD-10-CM

## 2016-12-03 NOTE — Progress Notes (Signed)
Stacy Murillo, please cnl pet- insurance not approved. Will order Ct scan instead.

## 2016-12-03 NOTE — Progress Notes (Signed)
Notified pt of change in imaging   Stacy Murillo please schedule CT scan...if CT scan is later than this Friday you will need to reschedule her appt this Friday to a few days after CT, as the appt is to review results.

## 2016-12-03 NOTE — Progress Notes (Signed)
Cancel the PET scan; schedule CT scan instead; please inform; schedule.

## 2016-12-05 ENCOUNTER — Ambulatory Visit
Admission: RE | Admit: 2016-12-05 | Discharge: 2016-12-05 | Disposition: A | Discharge status: Home / Self Care | Payer: BLUE CROSS/BLUE SHIELD | Source: Ambulatory Visit | Attending: Internal Medicine | Admitting: Internal Medicine

## 2016-12-05 DIAGNOSIS — I700 Atherosclerosis of aorta: Secondary | ICD-10-CM | POA: Diagnosis not present

## 2016-12-05 DIAGNOSIS — C8338 Diffuse large B-cell lymphoma, lymph nodes of multiple sites: Secondary | ICD-10-CM | POA: Insufficient documentation

## 2016-12-05 DIAGNOSIS — K5730 Diverticulosis of large intestine without perforation or abscess without bleeding: Secondary | ICD-10-CM | POA: Diagnosis not present

## 2016-12-05 LAB — POCT I-STAT CREATININE: Creatinine, Ser: 0.9 mg/dL (ref 0.44–1.00)

## 2016-12-05 MED ORDER — IOPAMIDOL (ISOVUE-300) INJECTION 61%
100.0000 mL | Freq: Once | INTRAVENOUS | Status: AC | PRN
  Administered 2016-12-05: 100 mL via INTRAVENOUS

## 2016-12-07 ENCOUNTER — Inpatient Hospital Stay: Payer: BLUE CROSS/BLUE SHIELD | Attending: Internal Medicine | Admitting: Internal Medicine

## 2016-12-07 ENCOUNTER — Inpatient Hospital Stay: Payer: BLUE CROSS/BLUE SHIELD

## 2016-12-07 VITALS — BP 118/72 | HR 59 | Temp 97.1°F | Wt 178.0 lb

## 2016-12-07 DIAGNOSIS — K219 Gastro-esophageal reflux disease without esophagitis: Secondary | ICD-10-CM | POA: Insufficient documentation

## 2016-12-07 DIAGNOSIS — Z86718 Personal history of other venous thrombosis and embolism: Secondary | ICD-10-CM | POA: Insufficient documentation

## 2016-12-07 DIAGNOSIS — Z9221 Personal history of antineoplastic chemotherapy: Secondary | ICD-10-CM | POA: Diagnosis not present

## 2016-12-07 DIAGNOSIS — C8583 Other specified types of non-Hodgkin lymphoma, intra-abdominal lymph nodes: Secondary | ICD-10-CM

## 2016-12-07 DIAGNOSIS — C8338 Diffuse large B-cell lymphoma, lymph nodes of multiple sites: Secondary | ICD-10-CM

## 2016-12-07 DIAGNOSIS — Z79899 Other long term (current) drug therapy: Secondary | ICD-10-CM | POA: Diagnosis not present

## 2016-12-07 DIAGNOSIS — E669 Obesity, unspecified: Secondary | ICD-10-CM | POA: Diagnosis not present

## 2016-12-07 LAB — COMPREHENSIVE METABOLIC PANEL
ALT: 18 U/L (ref 14–54)
ANION GAP: 8 (ref 5–15)
AST: 23 U/L (ref 15–41)
Albumin: 4.2 g/dL (ref 3.5–5.0)
Alkaline Phosphatase: 59 U/L (ref 38–126)
BUN: 13 mg/dL (ref 6–20)
CHLORIDE: 102 mmol/L (ref 101–111)
CO2: 27 mmol/L (ref 22–32)
Calcium: 9.2 mg/dL (ref 8.9–10.3)
Creatinine, Ser: 0.9 mg/dL (ref 0.44–1.00)
Glucose, Bld: 98 mg/dL (ref 65–99)
POTASSIUM: 3.7 mmol/L (ref 3.5–5.1)
Sodium: 137 mmol/L (ref 135–145)
Total Bilirubin: 0.9 mg/dL (ref 0.3–1.2)
Total Protein: 6.6 g/dL (ref 6.5–8.1)

## 2016-12-07 LAB — CBC WITH DIFFERENTIAL/PLATELET
BASOS ABS: 0 10*3/uL (ref 0–0.1)
Basophils Relative: 1 %
EOS PCT: 3 %
Eosinophils Absolute: 0.1 10*3/uL (ref 0–0.7)
HCT: 37 % (ref 35.0–47.0)
Hemoglobin: 13.2 g/dL (ref 12.0–16.0)
LYMPHS PCT: 18 %
Lymphs Abs: 0.7 10*3/uL — ABNORMAL LOW (ref 1.0–3.6)
MCH: 29.9 pg (ref 26.0–34.0)
MCHC: 35.5 g/dL (ref 32.0–36.0)
MCV: 84.2 fL (ref 80.0–100.0)
MONO ABS: 0.4 10*3/uL (ref 0.2–0.9)
Monocytes Relative: 10 %
Neutro Abs: 2.6 10*3/uL (ref 1.4–6.5)
Neutrophils Relative %: 68 %
PLATELETS: 162 10*3/uL (ref 150–440)
RBC: 4.4 MIL/uL (ref 3.80–5.20)
RDW: 13.9 % (ref 11.5–14.5)
WBC: 3.8 10*3/uL (ref 3.6–11.0)

## 2016-12-07 LAB — LACTATE DEHYDROGENASE: LDH: 135 U/L (ref 98–192)

## 2016-12-07 MED ORDER — HEPARIN SOD (PORK) LOCK FLUSH 100 UNIT/ML IV SOLN
500.0000 [IU] | Freq: Once | INTRAVENOUS | Status: AC
  Administered 2016-12-07: 500 [IU] via INTRAVENOUS

## 2016-12-07 MED ORDER — SODIUM CHLORIDE 0.9% FLUSH
10.0000 mL | INTRAVENOUS | Status: DC | PRN
  Administered 2016-12-07: 10 mL via INTRAVENOUS
  Filled 2016-12-07: qty 10

## 2016-12-07 NOTE — Assessment & Plan Note (Addendum)
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 Currently, Status post 6 cycles of R CHOP- complete response on PET scan. S/p 4 doses x high dose MXT [finished November 2017].   # July 25th 2018-  CT C/A/P- NED. Clinically no evidence of recurrence. Labs within normal limits.   # Given her high risk of recurrence; recommend surveillance CT scans every 4 months or so.   # Follow-up labs /visit /port flush in 14months; will again order CT scan at that time.   # 25 minutes face-to-face with the patient discussing the above plan of care; more than 50% of time spent on prognosis/ natural history; counseling and coordination.

## 2016-12-07 NOTE — Progress Notes (Signed)
Patient is here for a follow up today. Patient states no new concerns today.  

## 2016-12-07 NOTE — Progress Notes (Signed)
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 CT- 1-2 CM LN subpectoral/medistinal/ retro-peritoneal/pelvic/ Right inguinal LN 1.6cm- Bx- DLBCL- ABC; myc-POS; FISH gene re-arragement-NEG. PET- MULTIPLE LN/ Bone involvement; R-CHOP x6- CR'# OCT 2017- PET scan- CR; DEC 22nd 2017- BMBx- "atypical large B cells- <1%  [[facility]; II opinion- Dr.Grover- review of path- not concerning for residual lymphoma]; recommend surviellance  # March 26th  2018PET- NED  # Lumbar puncture- difficult-spinal headache. S/p high dose MXT x4.   # June 2017-Elevated LFTs- valacylovir/diflucan; resolved.   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 Gaining weight. No cough or shortness of breath. No chest pain.  No tingling or numbness. No cramps in the legs. No fevers. No chills. Denies any back pain. Denies any swelling in the legs.  REVIEW OF SYSTEMS:  A complete 10 point review of system is done which is negative except mentioned above/history of present illness.   PAST MEDICAL HISTORY :  Past Medical History:  Diagnosis Date  . Arthritis   . Blood dyscrasia   . Cancer ([place])   . Chest pain, unspecified   . Diverticulosis 07/06/15  . Dysrhythmia   . Esophagitis 07/06/15  . Fatigue   . Gastritis 07/06/15  . GERD (gastroesophageal reflux disease)   . 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 Leg cramps   . Lymphoma ([place]) 09/14/15   Monoclonal B cell lymphoma  . Night sweats   . Osteoporosis   . Overweight(278.02)    Obesity  . Palpitations   . Shortness of breath dyspnea   . Tachycardia   . Thrombocytopenia ([place])     PAST SURGICAL HISTORY :   Past Surgical History:  Procedure Laterality Date  . ABDOMINAL HYSTERECTOMY  2005  . BONE MARROW BIOPSY  09/14/15  . CHOLECYSTECTOMY  1997  . COLONOSCOPY  07/05/2014  . ESOPHAGOGASTRODUODENOSCOPY  07/05/2014  . INGUINAL LYMPH NODE BIOPSY Right 10/05/2015   Procedure: INGUINAL LYMPH NODE BIOPSY;  Surgeon: Christene Lye, MD;  Location: [facility] ORS;  Service: General;  Laterality: Right;  . PERIPHERAL VASCULAR CATHETERIZATION N/A 09/27/2015   Procedure: Glori Luis Cath Insertion;  Surgeon: Corene Cornea  Bunnie Domino, MD;  Location: [place] [facility];  Service: Cardiovascular;  Laterality: N/A;  . PORTACATH PLACEMENT Right     FAMILY HISTORY :   Family History  Problem Relation Age of Onset  . Heart disease Father        CABG x 4  . Multiple myeloma Father   . Hypertension Mother   . Pancreatic cancer Mother   . Ulcers Mother   . Asthma Mother   . Diabetes Neg Hx   . Breast cancer Neg Hx     SOCIAL HISTORY:    Social History  Substance Use Topics  . Smoking status: Never Smoker  . Smokeless tobacco: Never Used  . Alcohol use No    ALLERGIES:  has No Known Allergies.  MEDICATIONS:  Current Outpatient Prescriptions  Medication Sig Dispense Refill  . Alum Hydroxide-Mag Carbonate (GAVISCON PO) Take by mouth.    Marland Kitchen CALCIUM PO Take by mouth.    Javier Docker Oil 1000 MG CAPS Take by mouth.    . lidocaine-prilocaine (EMLA) cream   2  . ranitidine (ZANTAC) 150 MG tablet Take 150 mg by mouth 2 (two) times daily.    Marland Kitchen acetaminophen (TYLENOL 8 HOUR) 650 MG CR tablet Take 650 mg by mouth every 8 (eight) hours as needed for pain.     No current facility-administered medications for this visit.    Facility-Administered Medications Ordered in Other Visits  Medication Dose Route Frequency Provider Last Rate Last Dose  . 0.9 %  sodium chloride infusion   Intravenous Continuous Evlyn Kanner, NP   Stopped at 10/14/15 1312  . methotrexate (50 mg/ml) 6.3 g in sodium chloride 0.9 % 1,000 mL injection   Intravenous Once Charlaine Dalton R, MD      . sodium chloride flush (NS) 0.9 % injection 10 mL  10 mL Intravenous PRN Cammie Sickle, MD   10 mL at 10/05/16 1400  . sodium chloride flush (NS) 0.9 % injection 10 mL  10 mL Intravenous PRN Cammie Sickle, MD   10 mL at 12/07/16 0810    PHYSICAL EXAMINATION: ECOG PERFORMANCE STATUS: 1 - Symptomatic but completely ambulatory  BP 118/72 (BP Location: Left Arm, Patient Position: Sitting)   Pulse (!) 59   Temp (!) 97.1 F (36.2 C) (Tympanic)   Wt 178 lb (80.7 kg)   BMI 32.04 kg/m   Filed Weights   12/07/16 0832  Weight: 178 lb (80.7 kg)    GENERAL: Well-nourished well-developed; Alert, no distress and comfortable.   Accompanied by her husband.  EYES: no pallor or icterus OROPHARYNX: no thrush; good dentition  NECK: supple, no masses felt LYMPH:  no palpable lymphadenopathy in the cervical, axillary or inguinal regions LUNGS: clear to  auscultation and  No wheeze or crackles HEART/CVS: regular rate & rhythm and no murmurs; No lower extremity edema. ABDOMEN:abdomen soft, non-tender and normal bowel sounds Musculoskeletal:no cyanosis of digits and no clubbing  PSYCH: alert & oriented x 3 with fluent speech NEURO: no focal motor/sensory deficits SKIN:  no rashes or significant lesions; MediPort in place. LABORATORY DATA:  I have reviewed the data as listed    Component Value Date/Time   NA 137 12/07/2016 0821   NA 139 06/29/2010 0749   K 3.7 12/07/2016 0821   CL 102 12/07/2016 0821   CO2 27 12/07/2016 0821   GLUCOSE 98 12/07/2016 0821   BUN 13 12/07/2016 0821   BUN 16 06/29/2010 0749   CREATININE 0.90 12/07/2016 4562  CALCIUM 9.2 12/07/2016 0821   PROT 6.6 12/07/2016 0821   ALBUMIN 4.2 12/07/2016 0821   AST 23 12/07/2016 0821   ALT 18 12/07/2016 0821   ALKPHOS 59 12/07/2016 0821   BILITOT 0.9 12/07/2016 0821   GFRNONAA >60 12/07/2016 0821   GFRAA >60 12/07/2016 0821    No results found for: SPEP, UPEP  Lab Results  Component Value Date   WBC 3.8 12/07/2016   NEUTROABS 2.6 12/07/2016   HGB 13.2 12/07/2016   HCT 37.0 12/07/2016   MCV 84.2 12/07/2016   PLT 162 12/07/2016      Chemistry      Component Value Date/Time   NA 137 12/07/2016 0821   NA 139 06/29/2010 0749   K 3.7 12/07/2016 0821   CL 102 12/07/2016 0821   CO2 27 12/07/2016 0821   BUN 13 12/07/2016 0821   BUN 16 06/29/2010 0749   CREATININE 0.90 12/07/2016 0821   GLU 93 06/29/2010 0749      Component Value Date/Time   CALCIUM 9.2 12/07/2016 0821   ALKPHOS 59 12/07/2016 0821   AST 23 12/07/2016 0821   ALT 18 12/07/2016 0821   BILITOT 0.9 12/07/2016 0821       RADIOGRAPHIC STUDIES: I have personally reviewed the radiological images as listed and agreed with the findings in the report. No results found.   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 Currently, Status post 6 cycles of R CHOP- complete response on PET scan. S/p 4 doses x high dose MXT [finished November 2017].   # July 25th 2018-  CT C/A/P- NED. Clinically no evidence of recurrence. Labs within normal limits.   # Given her high risk of recurrence; recommend surveillance CT scans every 4 months or so.   # Follow-up labs /visit /port flush in 26month; will again order CT scan at that time.   # 25 minutes face-to-face with the patient discussing the above plan of care; more than 50% of time spent on prognosis/ natural history; counseling and coordination.  Orders Placed This Encounter  Procedures  . CBC with Differential/Platelet    Standing Status:   Future    Standing Expiration Date:   12/07/2017  . Comprehensive metabolic panel    Standing Status:   Future    Standing Expiration Date:   12/07/2017  . Lactate dehydrogenase    Standing Status:   Future    Standing Expiration Date:   12/07/2017     GCammie Sickle MD 12/07/2016 4:40 PM

## 2017-02-06 ENCOUNTER — Inpatient Hospital Stay: Payer: BLUE CROSS/BLUE SHIELD | Attending: Internal Medicine | Admitting: Internal Medicine

## 2017-02-06 ENCOUNTER — Inpatient Hospital Stay: Payer: BLUE CROSS/BLUE SHIELD

## 2017-02-06 VITALS — BP 130/82 | HR 65 | Temp 98.0°F | Resp 16 | Wt 180.2 lb

## 2017-02-06 DIAGNOSIS — C8338 Diffuse large B-cell lymphoma, lymph nodes of multiple sites: Secondary | ICD-10-CM

## 2017-02-06 DIAGNOSIS — K5790 Diverticulosis of intestine, part unspecified, without perforation or abscess without bleeding: Secondary | ICD-10-CM | POA: Insufficient documentation

## 2017-02-06 DIAGNOSIS — E669 Obesity, unspecified: Secondary | ICD-10-CM | POA: Diagnosis not present

## 2017-02-06 DIAGNOSIS — Z95828 Presence of other vascular implants and grafts: Secondary | ICD-10-CM

## 2017-02-06 DIAGNOSIS — M1990 Unspecified osteoarthritis, unspecified site: Secondary | ICD-10-CM | POA: Insufficient documentation

## 2017-02-06 DIAGNOSIS — Z9221 Personal history of antineoplastic chemotherapy: Secondary | ICD-10-CM | POA: Diagnosis not present

## 2017-02-06 DIAGNOSIS — C8583 Other specified types of non-Hodgkin lymphoma, intra-abdominal lymph nodes: Secondary | ICD-10-CM

## 2017-02-06 DIAGNOSIS — H04123 Dry eye syndrome of bilateral lacrimal glands: Secondary | ICD-10-CM | POA: Diagnosis not present

## 2017-02-06 DIAGNOSIS — K219 Gastro-esophageal reflux disease without esophagitis: Secondary | ICD-10-CM | POA: Diagnosis not present

## 2017-02-06 LAB — CBC WITH DIFFERENTIAL/PLATELET
Basophils Absolute: 0 10*3/uL (ref 0–0.1)
Basophils Relative: 1 %
EOS PCT: 1 %
Eosinophils Absolute: 0.1 10*3/uL (ref 0–0.7)
HEMATOCRIT: 37.4 % (ref 35.0–47.0)
Hemoglobin: 13 g/dL (ref 12.0–16.0)
LYMPHS ABS: 1.1 10*3/uL (ref 1.0–3.6)
LYMPHS PCT: 18 %
MCH: 29.4 pg (ref 26.0–34.0)
MCHC: 34.8 g/dL (ref 32.0–36.0)
MCV: 84.6 fL (ref 80.0–100.0)
MONO ABS: 0.5 10*3/uL (ref 0.2–0.9)
Monocytes Relative: 8 %
NEUTROS ABS: 4.2 10*3/uL (ref 1.4–6.5)
Neutrophils Relative %: 72 %
PLATELETS: 162 10*3/uL (ref 150–440)
RBC: 4.43 MIL/uL (ref 3.80–5.20)
RDW: 13.1 % (ref 11.5–14.5)
WBC: 5.8 10*3/uL (ref 3.6–11.0)

## 2017-02-06 LAB — COMPREHENSIVE METABOLIC PANEL
ALT: 16 U/L (ref 14–54)
AST: 22 U/L (ref 15–41)
Albumin: 4.4 g/dL (ref 3.5–5.0)
Alkaline Phosphatase: 57 U/L (ref 38–126)
Anion gap: 9 (ref 5–15)
BILIRUBIN TOTAL: 0.7 mg/dL (ref 0.3–1.2)
BUN: 21 mg/dL — AB (ref 6–20)
CHLORIDE: 102 mmol/L (ref 101–111)
CO2: 26 mmol/L (ref 22–32)
CREATININE: 0.94 mg/dL (ref 0.44–1.00)
Calcium: 9.5 mg/dL (ref 8.9–10.3)
Glucose, Bld: 87 mg/dL (ref 65–99)
POTASSIUM: 3.7 mmol/L (ref 3.5–5.1)
Sodium: 137 mmol/L (ref 135–145)
TOTAL PROTEIN: 6.8 g/dL (ref 6.5–8.1)

## 2017-02-06 LAB — LACTATE DEHYDROGENASE: LDH: 160 U/L (ref 98–192)

## 2017-02-06 MED ORDER — SODIUM CHLORIDE 0.9% FLUSH
10.0000 mL | INTRAVENOUS | Status: AC | PRN
  Administered 2017-02-06: 10 mL
  Filled 2017-02-06: qty 10

## 2017-02-06 MED ORDER — HEPARIN SOD (PORK) LOCK FLUSH 100 UNIT/ML IV SOLN
500.0000 [IU] | INTRAVENOUS | Status: AC | PRN
  Administered 2017-02-06: 500 [IU]

## 2017-02-06 NOTE — Progress Notes (Signed)
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 CT- 1-2 CM LN subpectoral/medistinal/ retro-peritoneal/pelvic/ Right inguinal LN 1.6cm- Bx- DLBCL- ABC; myc-POS; FISH gene re-arragement-NEG. PET- MULTIPLE LN/ Bone involvement; R-CHOP x6- CR'# OCT 2017- PET scan- CR; DEC 22nd 2017- BMBx- "atypical large B cells- <1%  [[facility]; II opinion- Dr.Grover- review of path- not concerning for residual lymphoma]; recommend surviellance  # March 26th  2018PET- NED  # Lumbar puncture- difficult-spinal headache. S/p high dose MXT x4.   # June 2017-Elevated LFTs- valacylovir/diflucan; resolved.   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   Patient complains of dry eyes. Evaluated by ophthalmology. Patient appetite is good. Gaining weight. No cough or shortness of breath. No chest pain. Denies any bruising.  No tingling or numbness. No cramps in the legs. No fevers. No chills. Denies any back pain. Denies any swelling in the legs. Denies any new lumps or bumps.  REVIEW OF SYSTEMS:  A complete 10 point review of system is done which is negative except mentioned above/history of present illness.   PAST MEDICAL HISTORY :  Past Medical History:  Diagnosis Date  . Arthritis   . Blood dyscrasia   . Cancer ([place])   . Chest pain, unspecified   . Diverticulosis 07/06/15  . Dysrhythmia   . Esophagitis 07/06/15  . Fatigue   . Gastritis 07/06/15  . GERD (gastroesophageal reflux disease)   . 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 Leg cramps   . Lymphoma ([place]) 09/14/15   Monoclonal B cell lymphoma  . Night sweats   . Osteoporosis   . Overweight(278.02)    Obesity  . Palpitations   . Shortness of breath dyspnea   . Tachycardia   . Thrombocytopenia ([place])     PAST SURGICAL HISTORY :   Past Surgical History:  Procedure Laterality Date  . ABDOMINAL HYSTERECTOMY  2005  . BONE MARROW BIOPSY  09/14/15  . CHOLECYSTECTOMY  1997  . COLONOSCOPY  07/05/2014  . ESOPHAGOGASTRODUODENOSCOPY  07/05/2014  . INGUINAL LYMPH NODE BIOPSY Right 10/05/2015   Procedure: INGUINAL LYMPH NODE BIOPSY;  Surgeon: Christene Lye, MD;  Location: [facility] ORS;  Service: General;  Laterality: Right;  . PERIPHERAL VASCULAR CATHETERIZATION  N/A 09/27/2015   Procedure: Glori Luis Cath Insertion;  Surgeon: Algernon Huxley, MD;  Location: [place] CV LAB;  Service: Cardiovascular;  Laterality: N/A;  . PORTACATH PLACEMENT Right     FAMILY HISTORY :   Family History  Problem Relation Age of Onset  . Heart disease Father        CABG x 4  . Multiple myeloma Father   . Hypertension Mother   . Pancreatic cancer Mother   . Ulcers Mother   . Asthma  Mother   . Diabetes Neg Hx   . Breast cancer Neg Hx     SOCIAL HISTORY:   Social History  Substance Use Topics  . Smoking status: Never Smoker  . Smokeless tobacco: Never Used  . Alcohol use No    ALLERGIES:  has No Known Allergies.  MEDICATIONS:  Current Outpatient Prescriptions  Medication Sig Dispense Refill  . acetaminophen (TYLENOL 8 HOUR) 650 MG CR tablet Take 650 mg by mouth every 8 (eight) hours as needed for pain.    Marland Kitchen Alum Hydroxide-Mag Carbonate (GAVISCON PO) Take by mouth.    Marland Kitchen CALCIUM PO Take by mouth.    Javier Docker Oil 1000 MG CAPS Take by mouth.    . lidocaine-prilocaine (EMLA) cream   2  . ranitidine (ZANTAC) 150 MG tablet Take 150 mg by mouth 2 (two) times daily.    Marland Kitchen XIIDRA 5 % SOLN   4   No current facility-administered medications for this visit.    Facility-Administered Medications Ordered in Other Visits  Medication Dose Route Frequency Provider Last Rate Last Dose  . 0.9 %  sodium chloride infusion   Intravenous Continuous Evlyn Kanner, NP   Stopped at 10/14/15 1312  . methotrexate (50 mg/ml) 6.3 g in sodium chloride 0.9 % 1,000 mL injection   Intravenous Once Charlaine Dalton R, MD      . sodium chloride flush (NS) 0.9 % injection 10 mL  10 mL Intravenous PRN Cammie Sickle, MD   10 mL at 10/05/16 1400    PHYSICAL EXAMINATION: ECOG PERFORMANCE STATUS: 1 - Symptomatic but completely ambulatory  BP 130/82 (BP Location: Right Arm, Patient Position: Sitting)   Pulse 65   Temp 98 F (36.7 C) (Tympanic)   Resp 16   Wt 180 lb 3.2 oz (81.7 kg)   BMI 32.43 kg/m   Filed Weights   02/06/17 1458  Weight: 180 lb 3.2 oz (81.7 kg)    GENERAL: Well-nourished well-developed; Alert, no distress and comfortable.   Accompanied by her husband.  EYES: no pallor or icterus OROPHARYNX: no thrush; good dentition  NECK: supple, no masses felt LYMPH:  no palpable lymphadenopathy in the cervical, axillary or inguinal regions LUNGS: clear to  auscultation and  No wheeze or crackles HEART/CVS: regular rate & rhythm and no murmurs; No lower extremity edema. ABDOMEN:abdomen soft, non-tender and normal bowel sounds Musculoskeletal:no cyanosis of digits and no clubbing  PSYCH: alert & oriented x 3 with fluent speech NEURO: no focal motor/sensory deficits SKIN:  no rashes or significant lesions; MediPort in place. LABORATORY DATA:  I have reviewed the data as listed    Component Value Date/Time   NA 137 02/06/2017 1418   NA 139 06/29/2010 0749   K 3.7 02/06/2017 1418   CL 102 02/06/2017 1418   CO2 26 02/06/2017 1418   GLUCOSE 87 02/06/2017 1418   BUN 21 (H) 02/06/2017 1418   BUN 16 06/29/2010 0749   CREATININE 0.94 02/06/2017 1418  CALCIUM 9.5 02/06/2017 1418   PROT 6.8 02/06/2017 1418   ALBUMIN 4.4 02/06/2017 1418   AST 22 02/06/2017 1418   ALT 16 02/06/2017 1418   ALKPHOS 57 02/06/2017 1418   BILITOT 0.7 02/06/2017 1418   GFRNONAA >60 02/06/2017 1418   GFRAA >60 02/06/2017 1418    No results found for: SPEP, UPEP  Lab Results  Component Value Date   WBC 5.8 02/06/2017   NEUTROABS 4.2 02/06/2017   HGB 13.0 02/06/2017   HCT 37.4 02/06/2017   MCV 84.6 02/06/2017   PLT 162 02/06/2017      Chemistry      Component Value Date/Time   NA 137 02/06/2017 1418   NA 139 06/29/2010 0749   K 3.7 02/06/2017 1418   CL 102 02/06/2017 1418   CO2 26 02/06/2017 1418   BUN 21 (H) 02/06/2017 1418   BUN 16 06/29/2010 0749   CREATININE 0.94 02/06/2017 1418   GLU 93 06/29/2010 0749      Component Value Date/Time   CALCIUM 9.5 02/06/2017 1418   ALKPHOS 57 02/06/2017 1418   AST 22 02/06/2017 1418   ALT 16 02/06/2017 1418   BILITOT 0.7 02/06/2017 1418       RADIOGRAPHIC STUDIES: I have personally reviewed the radiological images as listed and agreed with the findings in the report. No results found.   ASSESSMENT & PLAN:  Diffuse large B-cell lymphoma of lymph nodes of multiple regions ([place]) # Diffuse large B-cell  lymphoma ABC subtype/ leukemic cells noted in the blood [may 2017]. Currently, Status post 6 cycles of R CHOP- complete response on PET scan. S/p 4 doses x high dose MXT [finished November 2017].   # July 25th 2018-  CT C/A/P- NED. Clinically no evidence of recurrence. Labs within normal limits.   # Bil dry eyes- being followed by ophthalmology.   # Follow-up labs /MD/3 months-CT few days prior; port flush in 2 months.   Orders Placed This Encounter  Procedures  . CT ABDOMEN PELVIS W CONTRAST    Standing Status:   Future    Standing Expiration Date:   02/06/2018    Order Specific Question:   If indicated for the ordered procedure, I authorize the administration of contrast media per Radiology protocol    Answer:   Yes    Order Specific Question:   Preferred imaging location?    Answer:   [facility]    Order Specific Question:   Radiology Contrast Protocol - do NOT remove file path    Answer:   [email or web address]    Order Specific Question:   Reason for Exam additional comments    Answer:   lymphoma  . CT CHEST W CONTRAST    Standing Status:   Future    Standing Expiration Date:   02/06/2018    Order Specific Question:   If indicated for the ordered procedure, I authorize the administration of contrast media per Radiology protocol    Answer:   Yes    Order Specific Question:   Preferred imaging location?    Answer:   [facility]    Order Specific Question:   Radiology Contrast Protocol - do NOT remove file path    Answer:   [email or web address]    Order Specific Question:   Reason for Exam additional comments    Answer:   lymphoma     Cammie Sickle, MD 02/06/2017 7:29 PM

## 2017-02-06 NOTE — Assessment & Plan Note (Addendum)
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 Currently, Status post 6 cycles of R CHOP- complete response on PET scan. S/p 4 doses x high dose MXT [finished November 2017].   # July 25th 2018-  CT C/A/P- NED. Clinically no evidence of recurrence. Labs within normal limits.   # Bil dry eyes- being followed by ophthalmology.   # Follow-up labs /MD/3 months-CT few days prior; port flush in 2 months.

## 2017-02-15 ENCOUNTER — Emergency Department
Admission: EM | Admit: 2017-02-15 | Discharge: 2017-02-15 | Disposition: A | Discharge status: Home / Self Care | Payer: BLUE CROSS/BLUE SHIELD | Attending: Emergency Medicine | Admitting: Emergency Medicine

## 2017-02-15 ENCOUNTER — Encounter: Payer: Self-pay | Admitting: Radiology

## 2017-02-15 ENCOUNTER — Emergency Department: Payer: BLUE CROSS/BLUE SHIELD

## 2017-02-15 ENCOUNTER — Telehealth: Payer: Self-pay | Admitting: Internal Medicine

## 2017-02-15 DIAGNOSIS — K5792 Diverticulitis of intestine, part unspecified, without perforation or abscess without bleeding: Secondary | ICD-10-CM | POA: Insufficient documentation

## 2017-02-15 DIAGNOSIS — Z8572 Personal history of non-Hodgkin lymphomas: Secondary | ICD-10-CM | POA: Diagnosis not present

## 2017-02-15 DIAGNOSIS — Z79899 Other long term (current) drug therapy: Secondary | ICD-10-CM | POA: Insufficient documentation

## 2017-02-15 DIAGNOSIS — K5733 Diverticulitis of large intestine without perforation or abscess with bleeding: Secondary | ICD-10-CM

## 2017-02-15 DIAGNOSIS — R109 Unspecified abdominal pain: Secondary | ICD-10-CM | POA: Diagnosis present

## 2017-02-15 LAB — URINALYSIS, COMPLETE (UACMP) WITH MICROSCOPIC
Bacteria, UA: NONE SEEN
Bilirubin Urine: NEGATIVE
Glucose, UA: NEGATIVE mg/dL
Ketones, ur: NEGATIVE mg/dL
Leukocytes, UA: NEGATIVE
Nitrite: NEGATIVE
Protein, ur: 30 mg/dL — AB
Specific Gravity, Urine: 1.024 (ref 1.005–1.030)
pH: 5 (ref 5.0–8.0)

## 2017-02-15 LAB — COMPREHENSIVE METABOLIC PANEL
ALBUMIN: 4.4 g/dL (ref 3.5–5.0)
ALT: 14 U/L (ref 14–54)
ANION GAP: 11 (ref 5–15)
AST: 24 U/L (ref 15–41)
Alkaline Phosphatase: 63 U/L (ref 38–126)
BILIRUBIN TOTAL: 1 mg/dL (ref 0.3–1.2)
BUN: 12 mg/dL (ref 6–20)
CALCIUM: 9.4 mg/dL (ref 8.9–10.3)
CO2: 25 mmol/L (ref 22–32)
CREATININE: 1.11 mg/dL — AB (ref 0.44–1.00)
Chloride: 102 mmol/L (ref 101–111)
GFR, EST NON AFRICAN AMERICAN: 52 mL/min — AB (ref >60)
Glucose, Bld: 159 mg/dL — ABNORMAL HIGH (ref 65–99)
Potassium: 3.5 mmol/L (ref 3.5–5.1)
SODIUM: 138 mmol/L (ref 135–145)
Total Protein: 7.4 g/dL (ref 6.5–8.1)

## 2017-02-15 LAB — CBC
HCT: 41 % (ref 35.0–47.0)
Hemoglobin: 14.1 g/dL (ref 12.0–16.0)
MCH: 29.6 pg (ref 26.0–34.0)
MCHC: 34.3 g/dL (ref 32.0–36.0)
MCV: 86.3 fL (ref 80.0–100.0)
Platelets: 157 K/uL (ref 150–440)
RBC: 4.75 MIL/uL (ref 3.80–5.20)
RDW: 13.3 % (ref 11.5–14.5)
WBC: 9.3 K/uL (ref 3.6–11.0)

## 2017-02-15 MED ORDER — METRONIDAZOLE 500 MG PO TABS: 500.0000 mg | ORAL_TABLET | Freq: Three times a day (TID) | ORAL | 0 refills | Status: AC

## 2017-02-15 MED ORDER — CIPROFLOXACIN HCL 500 MG PO TABS: 500.0000 mg | ORAL_TABLET | Freq: Two times a day (BID) | ORAL | 0 refills | Status: AC

## 2017-02-15 MED ORDER — ONDANSETRON HCL 4 MG PO TABS: 4.0000 mg | ORAL_TABLET | Freq: Three times a day (TID) | ORAL | 0 refills | Status: AC | PRN

## 2017-02-15 MED ORDER — SODIUM CHLORIDE 0.9 % IV BOLUS (SEPSIS)
1000.0000 mL | Freq: Once | INTRAVENOUS | Status: AC
  Administered 2017-02-15: 1000 mL via INTRAVENOUS

## 2017-02-15 MED ORDER — IOPAMIDOL (ISOVUE-300) INJECTION 61%
100.0000 mL | Freq: Once | INTRAVENOUS | Status: AC | PRN
  Administered 2017-02-15: 100 mL via INTRAVENOUS

## 2017-02-15 MED ORDER — IOPAMIDOL (ISOVUE-300) INJECTION 61%
30.0000 mL | Freq: Once | INTRAVENOUS | Status: AC | PRN
  Administered 2017-02-15: 30 mL via ORAL

## 2017-02-15 NOTE — Telephone Encounter (Signed)
fyi

## 2017-02-15 NOTE — Telephone Encounter (Signed)
Patient Name: Stacy Murillo DOB: August 11, 1955 Initial Comment Caller states, [facility] office- pt is having lower abdominal pain. Bowel movement had blood and gelatinous stuff in it. Nurse Assessment Nurse: Sherrell Puller, RN, Amy Date/Time Eilene Ghazi Time): 02/15/2017 9:15:46 AM Confirm and document reason for call. If symptomatic, describe symptoms. ---Caller states she has had lower abdominal pain for 3 days, she says she saw blood in her stool yesterday when she wiped. Temperature this morning is 99.9 Does the patient have any new or worsening symptoms? ---Yes Will a triage be completed? ---Yes Related visit to physician within the last 2 weeks? ---No Does the PT have any chronic conditions? (i.e. diabetes, asthma, etc.) ---Yes List chronic conditions. ---Non-hodgkins Lymphoma Is this a behavioral health or substance abuse call? ---No Guidelines Guideline Title Affirmed Question Affirmed Notes Abdominal Pain - Female Blood in bowel movements (Exception: blood on surface of BM with constipation) Final Disposition User Go to ED Now Sherrell Puller, RN, [facility] - ED Caller Disagree/Comply Comply Caller Understands Yes PreDisposition Home Care

## 2017-02-15 NOTE — Telephone Encounter (Signed)
Pt called c/o lower abdominal pain and states that she has a bowel movement there is blood and gelatinous stuff. Pt also mentions that she is running a low grade fever. Abdominal pain since Tuesday, blood and gelatinous last night/this morning. Sent call to [facility] triage.   Call pt @ [phone]

## 2017-02-15 NOTE — ED Provider Notes (Signed)
[facility] Emergency Department Provider Note ____________________________________________   First MD Initiated Contact with Patient 02/15/17 1211     (approximate)  I have reviewed the triage vital signs and the nursing notes.   HISTORY  Chief Complaint Abdominal Pain    HPI Stacy Murillo is a 61 y.o. female with a history of lymphoma (last chemo 11 mos ago) and other PMH as noted below who presents with lower abdominal pain for 2 days, associated with loose stools with small streaks of blood, not associated with nausea or vomiting or urinary symptoms. No vaginal bleeding or discharge.  Pt does report low grade fever yesterday to 99s.  No prior history of this pain.   Past Medical History:  Diagnosis Date  . Arthritis   . Blood dyscrasia   . Cancer ([name])   . Chest pain, unspecified   . Diverticulosis 07/06/15  . Dysrhythmia   . Esophagitis 07/06/15  . Fatigue   . Gastritis 07/06/15  . GERD (gastroesophageal reflux disease)   . 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 Leg cramps   . Lymphoma ([place]) 09/14/15   Monoclonal B cell lymphoma  . Night sweats   . Osteoporosis   . Overweight(278.02)    Obesity  . Palpitations   . Shortness of breath dyspnea   . Tachycardia   . Thrombocytopenia [facility])     Patient Active Problem List   Diagnosis Date Noted  . Diffuse large B-cell lymphoma of lymph nodes of multiple regions ([place]) 05/25/2016  . Elevated LFTs 10/27/2015  . Deep vein thrombosis (DVT) of lower extremity ([place]) 10/27/2015  . Generalized headaches 10/27/2015  . Muscle cramps 10/27/2015  . Oral mucositis due to antineoplastic therapy 10/14/2015  . 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 Palpitation 09/14/2015  . SOB (shortness of breath) on exertion 09/14/2015  . Palpitations 08/24/2015  . Left knee pain 07/20/2014  . Achilles tendonitis 07/20/2014  . Esophageal reflux 04/06/2014  . Special screening for malignant  neoplasms, colon 04/06/2014  . Routine general medical examination at a health care facility 07/17/2012  . Screening for breast cancer 07/10/2011  . Osteopenia 07/06/2011  . Vitamin D deficiency 07/06/2011  . Hyperlipidemia 07/06/2011  . Disorder of bone and cartilage 07/06/2011  . Osteoporosis   . Overweight 10/19/2008    Past Surgical History:  Procedure Laterality Date  . ABDOMINAL HYSTERECTOMY  2005  . BONE MARROW BIOPSY  09/14/15  . CHOLECYSTECTOMY  1997  . COLONOSCOPY  07/05/2014  . ESOPHAGOGASTRODUODENOSCOPY  07/05/2014  . INGUINAL LYMPH NODE BIOPSY Right 10/05/2015   Procedure: INGUINAL LYMPH NODE BIOPSY;  Surgeon: Christene Lye, MD;  Location: [facility] ORS;  Service: General;  Laterality: Right;  . PERIPHERAL VASCULAR CATHETERIZATION N/A 09/27/2015   Procedure: Glori Luis Cath Insertion;  Surgeon: Algernon Huxley, MD;  Location: [place] CV LAB;  Service: Cardiovascular;  Laterality: N/A;  . PORTACATH PLACEMENT Right     Prior to Admission medications   Medication Sig Start Date End Date Taking? Authorizing Provider  acetaminophen (TYLENOL 8 HOUR) 650 MG CR tablet Take 650 mg by mouth every 8 (eight) hours as needed for pain.    [provider]  Alum Hydroxide-Mag Carbonate (GAVISCON PO) Take by mouth.    [provider]  CALCIUM PO Take by mouth.    [provider]  ciprofloxacin (CIPRO) 500 MG tablet Take 1 tablet (500 mg total) by mouth 2 (two) times daily. 02/15/17 02/25/17  Arta Silence,  MD  Krill Oil 1000 MG CAPS Take by mouth.    [provider]  lidocaine-prilocaine (EMLA) cream  05/17/16   [provider]  metroNIDAZOLE (FLAGYL) 500 MG tablet Take 1 tablet (500 mg total) by mouth 3 (three) times daily. 02/15/17 02/25/17  Arta Silence, MD  ondansetron (ZOFRAN) 4 MG tablet Take 1 tablet (4 mg total) by mouth every 8 (eight) hours as needed for nausea or vomiting. 02/15/17 03/17/17  Arta Silence, MD  ranitidine  (ZANTAC) 150 MG tablet Take 150 mg by mouth 2 (two) times daily.    [provider]  Shirley Friar 5 % SOLN  01/24/17   [provider]    Allergies Patient has no known allergies.  Family History  Problem Relation Age of Onset  . Heart disease Father        CABG x 4  . Multiple myeloma Father   . Hypertension Mother   . Pancreatic cancer Mother   . Ulcers Mother   . Asthma Mother   . Diabetes Neg Hx   . Breast cancer Neg Hx     Social History Social History  Substance Use Topics  . Smoking status: Never Smoker  . Smokeless tobacco: Never Used  . Alcohol use No    Review of Systems  Constitutional: Positive for fever. Eyes: No redness. ENT: No neck pain. Cardiovascular: Denies chest pain. Respiratory: Denies shortness of breath. Gastrointestinal: Positive for diarrhea. Genitourinary: Negative for dysuria.  Musculoskeletal: Negative for back pain. Skin: Negative for rash. Neurological: Negative for headache.    ____________________________________________   PHYSICAL EXAM:  VITAL SIGNS: ED Triage Vitals  Enc Vitals Group     BP 02/15/17 1022 125/69     Pulse Rate 02/15/17 1022 98     Resp 02/15/17 1159 14     Temp 02/15/17 1022 98.9 F (37.2 C)     Temp Source 02/15/17 1022 Oral     SpO2 02/15/17 1022 (!) 10 %     Weight 02/15/17 1023 180 lb (81.6 kg)     Height 02/15/17 1023 5' 2"  (1.575 m)     Head Circumference --      Peak Flow --      Pain Score 02/15/17 1022 8     Pain Loc --      Pain Edu? --      Excl. in [place]? --     Constitutional: Alert and oriented. Well appearing and in no acute distress. Eyes: Conjunctivae are normal.  Head: Atraumatic. Nose: No congestion/rhinnorhea. Mouth/Throat: Mucous membranes are moist.   Neck: Normal range of motion.  Cardiovascular:  Good peripheral circulation. Respiratory: Normal respiratory effort.  No retractions. Lungs CTAB. Gastrointestinal: Soft with mild bilat lower quadrant tenderness. No  distention.  Genitourinary: No CVA tenderness. Musculoskeletal: No lower extremity edema.  Extremities warm and well perfused.  Neurologic:  Normal speech and language. No gross focal neurologic deficits are appreciated.  Skin:  Skin is warm and dry. No rash noted. Psychiatric: Mood and affect are normal. Speech and behavior are normal.  ____________________________________________   LABS (all labs ordered are listed, but only abnormal results are displayed)  Labs Reviewed  URINALYSIS, COMPLETE (UACMP) WITH MICROSCOPIC - Abnormal; Notable for the following:       Result Value   Color, Urine AMBER (*)    APPearance CLEAR (*)    Hgb urine dipstick SMALL (*)    Protein, ur 30 (*)    Squamous Epithelial / LPF 0-5 (*)  All other components within normal limits  COMPREHENSIVE METABOLIC PANEL - Abnormal; Notable for the following:    Glucose, Bld 159 (*)    Creatinine, Ser 1.11 (*)    GFR calc non Af Amer 52 (*)    All other components within normal limits  CBC   ____________________________________________  EKG   ____________________________________________  RADIOLOGY  CT abd: sigmoid diverticulitis with no e/o abscess or perforation.   ____________________________________________   PROCEDURES  Procedure(s) performed: No    Critical Care performed: No ____________________________________________   INITIAL IMPRESSION / ASSESSMENT AND PLAN / ED COURSE  Pertinent labs & imaging results that were available during my care of the patient were reviewed by me and considered in my medical decision making (see chart for details).  61 year old female with past history of lymphoma presents with lower abdominal pain with loose stools for the last 2 days, including small streaks of blood in the stool. Vital signs are normal, patient is very well-appearing, and exam is as described with mild bilateral lower quadrant tenderness.  Differential includes infectious diarrhea,  colitis, diverticulitis, less likely cancer recurrence.  Also consider UTI with incidental diarrhea.  Patient declines rectal exam at this time, although given the very small amount of blood in the stool is most consistent with infectious cause rather than hemorrhoid or other acute lesion.  Plan for CT abd to eval for colitis.     ----------------------------------------- 2:06 PM on 02/15/2017 -----------------------------------------  CT is consistent with sigmoid diverticulitis with no abscess or perforation. Lab workup is unremarkable, and she continues to feel on appear well.  She is tolerating PO.  Patient is appropriate for outpatient treatment. Will discharge home with PO abx. thorough return precautions given, and patient was instructed to follow-up with her primary care doctor.  ____________________________________________   FINAL CLINICAL IMPRESSION(S) / ED DIAGNOSES  Final diagnoses:  Diverticulitis      NEW MEDICATIONS STARTED DURING THIS VISIT:  New Prescriptions   CIPROFLOXACIN (CIPRO) 500 MG TABLET    Take 1 tablet (500 mg total) by mouth 2 (two) times daily.   METRONIDAZOLE (FLAGYL) 500 MG TABLET    Take 1 tablet (500 mg total) by mouth 3 (three) times daily.   ONDANSETRON (ZOFRAN) 4 MG TABLET    Take 1 tablet (4 mg total) by mouth every 8 (eight) hours as needed for nausea or vomiting.     Note:  This document was prepared using Dragon voice recognition software and may include unintentional dictation errors.    Arta Silence, MD 02/15/17 [phone]

## 2017-02-15 NOTE — Discharge Instructions (Signed)
Return to the ER for new or worsening abdominal pain, fever, nausea or vomiting, inability to tolerate your oral medications, worsening blood in the stool, or any other new or worsening symptoms that concern you. Follow up with your primary care doctor next week.

## 2017-02-15 NOTE — ED Triage Notes (Signed)
First Nurse Note:  Sent by PCP for ED evaluation.  Patient is AAOx3.  Skin warm and dry. NAD.  Ambulates with easy and steady gait.

## 2017-02-15 NOTE — Telephone Encounter (Signed)
Patient is on her way to the emergency room now.

## 2017-02-15 NOTE — ED Triage Notes (Signed)
Pt reports that she is having lower abd pain with Spotting. Doesn't hurt when she is sitting, but when gets up to move and with movement it hurts. She has to stand for a while and pain will go away. Pt is a Cancer pt.

## 2017-02-17 DIAGNOSIS — K5733 Diverticulitis of large intestine without perforation or abscess with bleeding: Secondary | ICD-10-CM

## 2017-03-19 ENCOUNTER — Telehealth: Payer: Self-pay | Admitting: *Deleted

## 2017-03-19 ENCOUNTER — Other Ambulatory Visit: Payer: Self-pay | Admitting: Internal Medicine

## 2017-03-19 ENCOUNTER — Inpatient Hospital Stay: Payer: BLUE CROSS/BLUE SHIELD | Attending: Internal Medicine | Admitting: Internal Medicine

## 2017-03-19 ENCOUNTER — Inpatient Hospital Stay: Payer: BLUE CROSS/BLUE SHIELD

## 2017-03-19 VITALS — BP 127/84 | HR 97 | Temp 98.4°F | Resp 18 | Wt 176.7 lb

## 2017-03-19 DIAGNOSIS — R740 Nonspecific elevation of levels of transaminase and lactic acid dehydrogenase [LDH]: Secondary | ICD-10-CM | POA: Diagnosis not present

## 2017-03-19 DIAGNOSIS — M1990 Unspecified osteoarthritis, unspecified site: Secondary | ICD-10-CM | POA: Diagnosis not present

## 2017-03-19 DIAGNOSIS — C7951 Secondary malignant neoplasm of bone: Secondary | ICD-10-CM | POA: Diagnosis not present

## 2017-03-19 DIAGNOSIS — K5732 Diverticulitis of large intestine without perforation or abscess without bleeding: Secondary | ICD-10-CM | POA: Insufficient documentation

## 2017-03-19 DIAGNOSIS — Z7901 Long term (current) use of anticoagulants: Secondary | ICD-10-CM | POA: Insufficient documentation

## 2017-03-19 DIAGNOSIS — Z923 Personal history of irradiation: Secondary | ICD-10-CM

## 2017-03-19 DIAGNOSIS — Z86718 Personal history of other venous thrombosis and embolism: Secondary | ICD-10-CM | POA: Diagnosis not present

## 2017-03-19 DIAGNOSIS — Z5111 Encounter for antineoplastic chemotherapy: Secondary | ICD-10-CM | POA: Diagnosis not present

## 2017-03-19 DIAGNOSIS — R161 Splenomegaly, not elsewhere classified: Secondary | ICD-10-CM | POA: Diagnosis not present

## 2017-03-19 DIAGNOSIS — Z7689 Persons encountering health services in other specified circumstances: Secondary | ICD-10-CM | POA: Insufficient documentation

## 2017-03-19 DIAGNOSIS — C8338 Diffuse large B-cell lymphoma, lymph nodes of multiple sites: Secondary | ICD-10-CM

## 2017-03-19 DIAGNOSIS — D696 Thrombocytopenia, unspecified: Secondary | ICD-10-CM | POA: Insufficient documentation

## 2017-03-19 DIAGNOSIS — K5790 Diverticulosis of intestine, part unspecified, without perforation or abscess without bleeding: Secondary | ICD-10-CM | POA: Insufficient documentation

## 2017-03-19 DIAGNOSIS — K219 Gastro-esophageal reflux disease without esophagitis: Secondary | ICD-10-CM | POA: Insufficient documentation

## 2017-03-19 DIAGNOSIS — Z79899 Other long term (current) drug therapy: Secondary | ICD-10-CM

## 2017-03-19 DIAGNOSIS — C8583 Other specified types of non-Hodgkin lymphoma, intra-abdominal lymph nodes: Secondary | ICD-10-CM

## 2017-03-19 LAB — CBC WITH DIFFERENTIAL/PLATELET
BASOS ABS: 0 10*3/uL (ref 0–0.1)
Basophils Relative: 1 %
EOS ABS: 0.1 10*3/uL (ref 0–0.7)
EOS PCT: 1 %
HCT: 39 % (ref 35.0–47.0)
Hemoglobin: 13.4 g/dL (ref 12.0–16.0)
Lymphocytes Relative: 15 %
Lymphs Abs: 0.7 10*3/uL — ABNORMAL LOW (ref 1.0–3.6)
MCH: 28.5 pg (ref 26.0–34.0)
MCHC: 34.3 g/dL (ref 32.0–36.0)
MCV: 83.1 fL (ref 80.0–100.0)
Monocytes Absolute: 1.2 10*3/uL — ABNORMAL HIGH (ref 0.2–0.9)
Monocytes Relative: 25 %
Neutro Abs: 2.7 10*3/uL (ref 1.4–6.5)
Neutrophils Relative %: 58 %
PLATELETS: 97 10*3/uL — AB (ref 150–440)
RBC: 4.69 MIL/uL (ref 3.80–5.20)
RDW: 13.1 % (ref 11.5–14.5)
WBC: 4.6 10*3/uL (ref 3.6–11.0)

## 2017-03-19 LAB — COMPREHENSIVE METABOLIC PANEL
ALT: 46 U/L (ref 14–54)
AST: 37 U/L (ref 15–41)
Albumin: 4.4 g/dL (ref 3.5–5.0)
Alkaline Phosphatase: 118 U/L (ref 38–126)
Anion gap: 10 (ref 5–15)
BUN: 15 mg/dL (ref 6–20)
CHLORIDE: 100 mmol/L — AB (ref 101–111)
CO2: 28 mmol/L (ref 22–32)
Calcium: 9.6 mg/dL (ref 8.9–10.3)
Creatinine, Ser: 0.84 mg/dL (ref 0.44–1.00)
Glucose, Bld: 89 mg/dL (ref 65–99)
POTASSIUM: 4.2 mmol/L (ref 3.5–5.1)
SODIUM: 138 mmol/L (ref 135–145)
Total Bilirubin: 0.5 mg/dL (ref 0.3–1.2)
Total Protein: 7.3 g/dL (ref 6.5–8.1)

## 2017-03-19 LAB — LACTATE DEHYDROGENASE: LDH: 683 U/L — AB (ref 98–192)

## 2017-03-19 NOTE — Telephone Encounter (Signed)
Per Dr. Jacinto Reap, he would like patient to come in to [facility] office today at 2:30. Robin notified and is to call and schedule appt. Thanks!

## 2017-03-19 NOTE — Telephone Encounter (Signed)
Mr [place] called because patient concerned that she is having same symptoms she had when diagnosed with her cancer, night sweats, heart fluttering, leg cramps and bruising for no apparent reason. Asking to be checked ASAP, lab md Her CT is not until 05/02/17.  Please advise.

## 2017-03-19 NOTE — Progress Notes (Signed)
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 CT- 1-2 CM LN subpectoral/medistinal/ retro-peritoneal/pelvic/ Right inguinal LN 1.6cm- Bx- DLBCL- ABC; myc-POS; FISH gene re-arragement-NEG. PET- MULTIPLE LN/ Bone involvement; R-CHOP x6- CR'# OCT 2017- PET scan- CR; DEC 22nd 2017- BMBx- "atypical large B cells- <1%  [[facility]; II opinion- Dr.Grover- review of path- not concerning for residual lymphoma]; recommend surviellance  # March 26th  2018PET- NED  # Lumbar puncture- difficult-spinal headache. S/p high dose MXT x4.   # June 2017-Elevated LFTs- valacylovir/diflucan; resolved.   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  Patient had a recent episode of lower abdominal pain- that led to a CT scan that showed acute sigmoid diverticulitis. She is currently status post ciprofloxacin-Flagyl symptoms are resolved.  However in the last month patient noted to have worsening- cramping in the legs; episodes of night sweats/clammy [approximately 3 episodes of week]; and also palpitations. She has poor appetite. Noted to have easy bruising. [Interesting these were patient's present symptoms at the time of diagnosis of her lymphoma more than a year ago]; poor appetite. No weight loss.  She continues to rise. Denies any cough or shortness of breath or chest pain.   REVIEW OF SYSTEMS:  A complete 10 point review of system is done which is negative except mentioned above/history of present illness.   PAST MEDICAL HISTORY :  Past Medical History:  Diagnosis Date  . Arthritis   . Blood dyscrasia   . Cancer ([place])   . Chest pain, unspecified   . Diverticulosis 07/06/15  . Dysrhythmia   . Esophagitis 07/06/15  . Fatigue   . Gastritis 07/06/15  . GERD (gastroesophageal reflux disease)   . 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 Leg cramps   . 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 Night sweats   . Osteoporosis   . Overweight(278.02)    Obesity  . Palpitations   . Shortness of breath dyspnea   . Tachycardia   . Thrombocytopenia ([place])     PAST SURGICAL HISTORY :   Past Surgical History:  Procedure Laterality Date  . ABDOMINAL HYSTERECTOMY  2005  . BONE MARROW BIOPSY  09/14/15  .  CHOLECYSTECTOMY  1997  . COLONOSCOPY  07/05/2014  . ESOPHAGOGASTRODUODENOSCOPY  07/05/2014  . PORTACATH PLACEMENT Right     FAMILY HISTORY :   Family History  Problem Relation Age of Onset  . Heart disease Father        CABG x 4  . Multiple myeloma Father   . Hypertension Mother   . Pancreatic cancer Mother   . Ulcers Mother   . Asthma Mother   . Diabetes Neg Hx   . Breast cancer  Neg Hx     SOCIAL HISTORY:   Social History   Tobacco Use  . Smoking status: Never Smoker  . Smokeless tobacco: Never Used  Substance Use Topics  . Alcohol use: No    Alcohol/week: 0.0 oz  . Drug use: No    ALLERGIES:  has No Known Allergies.  MEDICATIONS:  Current Outpatient Medications  Medication Sig Dispense Refill  . acetaminophen (TYLENOL 8 HOUR) 650 MG CR tablet Take 650 mg by mouth every 8 (eight) hours as needed for pain.    Marland Kitchen CALCIUM PO Take 600 mg 2 (two) times daily by mouth.     Javier Docker Oil 1000 MG CAPS Take by mouth.    . ranitidine (ZANTAC) 150 MG tablet Take 150 mg by mouth 2 (two) times daily.    . Alum Hydroxide-Mag Carbonate (GAVISCON PO) Take by mouth.    . lidocaine-prilocaine (EMLA) cream   2  . XIIDRA 5 % SOLN   4   No current facility-administered medications for this visit.    Facility-Administered Medications Ordered in Other Visits  Medication Dose Route Frequency Provider Last Rate Last Dose  . 0.9 %  sodium chloride infusion   Intravenous Continuous Evlyn Kanner, NP   Stopped at 10/14/15 1312  . methotrexate (50 mg/ml) 6.3 g in sodium chloride 0.9 % 1,000 mL injection   Intravenous Once Charlaine Dalton R, MD      . sodium chloride flush (NS) 0.9 % injection 10 mL  10 mL Intravenous PRN Cammie Sickle, MD   10 mL at 10/05/16 1400    PHYSICAL EXAMINATION: ECOG PERFORMANCE STATUS: 1 - Symptomatic but completely ambulatory  BP 127/84 (BP Location: Left Arm, Patient Position: Sitting)   Pulse 97   Temp 98.4 F (36.9 C) (Tympanic)   Resp 18   Wt 176 lb 11.2 oz (80.2 kg)   BMI 32.32 kg/m   Filed Weights   03/19/17 1429  Weight: 176 lb 11.2 oz (80.2 kg)    GENERAL: Well-nourished well-developed; Alert, no distress and comfortable.   Accompanied by her husband.  EYES: no pallor or icterus OROPHARYNX: no thrush; good dentition  NECK: supple, no masses felt LYMPH:  no palpable lymphadenopathy in the cervical, axillary or  inguinal regions LUNGS: clear to auscultation and  No wheeze or crackles HEART/CVS: regular rate & rhythm and no murmurs; No lower extremity edema. ABDOMEN:abdomen soft, non-tender and normal bowel sounds Musculoskeletal:no cyanosis of digits and no clubbing  PSYCH: alert & oriented x 3 with fluent speech NEURO: no focal motor/sensory deficits SKIN:  no rashes or significant lesions; MediPort in place. LABORATORY DATA:  I have reviewed the data as listed    Component Value Date/Time   NA 138 03/19/2017 1512   NA 139 06/29/2010 0749   K 4.2 03/19/2017 1512   CL 100 (L) 03/19/2017 1512   CO2 28 03/19/2017 1512   GLUCOSE 89 03/19/2017 1512   BUN 15 03/19/2017 1512   BUN  16 06/29/2010 0749   CREATININE 0.84 03/19/2017 1512   CALCIUM 9.6 03/19/2017 1512   PROT 7.3 03/19/2017 1512   ALBUMIN 4.4 03/19/2017 1512   AST 37 03/19/2017 1512   ALT 46 03/19/2017 1512   ALKPHOS 118 03/19/2017 1512   BILITOT 0.5 03/19/2017 1512   GFRNONAA >60 03/19/2017 1512   GFRAA >60 03/19/2017 1512    No results found for: SPEP, UPEP  Lab Results  Component Value Date   WBC 9.3 02/15/2017   NEUTROABS 4.2 02/06/2017   HGB 14.1 02/15/2017   HCT 41.0 02/15/2017   MCV 86.3 02/15/2017   PLT 157 02/15/2017      Chemistry      Component Value Date/Time   NA 138 03/19/2017 1512   NA 139 06/29/2010 0749   K 4.2 03/19/2017 1512   CL 100 (L) 03/19/2017 1512   CO2 28 03/19/2017 1512   BUN 15 03/19/2017 1512   BUN 16 06/29/2010 0749   CREATININE 0.84 03/19/2017 1512   GLU 93 06/29/2010 0749      Component Value Date/Time   CALCIUM 9.6 03/19/2017 1512   ALKPHOS 118 03/19/2017 1512   AST 37 03/19/2017 1512   ALT 46 03/19/2017 1512   BILITOT 0.5 03/19/2017 1512     IMPRESSION: Acute sigmoid diverticulitis affecting the distal sigmoid colon. No abscess.   Electronically Signed   By: Nelson Chimes M.D.   On: 02/15/2017 13:44  RADIOGRAPHIC STUDIES: I have personally reviewed the  radiological images as listed and agreed with the findings in the report. No results found.   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 Currently, Status post 6 cycles of R CHOP- complete response on PET scan. S/p 4 doses x high dose MXT [finished November 2017].  # Concerning for recurrence based on symptoms- night sweats/leg cramping/palpitations/worsening fatigue/easy bruising [similar to prior presentation]. Recommend a PET scan ASAP [patient's last CT scan-October 2018/July 2018 with NED]  # Acute diverticulitis- status post ciprofloxacin and Flagyl; symptoms resolved.  #  Check CBC CMP and LDH PT PTT. PET scan ASAP.   # keep appts as scheduled; will call with PET scan results/labs.  # Reviewed the CT imaging from October 2018- no evidence of any lymphoma or splenomegaly or lymphadenopathy.  Orders Placed This Encounter  Procedures  . NM PET Image Restag (PS) Skull Base To Thigh    Standing Status:   Future    Standing Expiration Date:   03/19/2018    Order Specific Question:   If indicated for the ordered procedure, I authorize the administration of a radiopharmaceutical per Radiology protocol    Answer:   Yes    Order Specific Question:   Preferred imaging location?    Answer:   [facility]    Order Specific Question:   Radiology Contrast Protocol - do NOT remove file path    Answer:   [email or web address]    Order Specific Question:   Reason for Exam additional comments    Answer:   lymphoma; ? recurrence  . CBC with Differential/Platelet    Standing Status:   Future    Standing Expiration Date:   04/23/2018  . Comprehensive metabolic panel    Standing Status:   Future    Standing Expiration Date:   04/23/2018  . Lactate dehydrogenase    Standing Status:   Future  Standing Expiration Date:   04/23/2018   . C-reactive protein    Standing Status:   Future    Standing Expiration Date:   03/19/2018  . Protime-INR    Standing Status:   Future    Standing Expiration Date:   03/19/2018  . APTT    Standing Status:   Future    Standing Expiration Date:   03/19/2018     Cammie Sickle, MD 03/19/2017 3:46 PM

## 2017-03-19 NOTE — Telephone Encounter (Signed)
Stacy Murillo informed of seeing doctor today in [place] office and will await call from scheduler [place]. He agrees to appt

## 2017-03-19 NOTE — Progress Notes (Signed)
Here for f/u as an add on related to heart rate flutter ( rapid heart rate  Over 100 bpm ) ,night sweats,feeling low energy and bilateral leg cramps

## 2017-03-19 NOTE — Assessment & Plan Note (Deleted)
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 Currently, Status post 6 cycles of R CHOP- complete response on PET scan. S/p 4 doses x high dose MXT [finished November 2017].  # Concerning for recurrence based on symptoms- night sweats/leg cramping/palpitations/worsening fatigue/easy bruising [similar to prior presentation]. Recommend a PET scan ASAP [patient's last CT scan-October 2018/July 2018 with NED]  #  Check CBC CMP and LDH PT PTT. PET scan ASAP.   # keep appts as scheduled; will call with PET scan results/labs.

## 2017-03-19 NOTE — Assessment & Plan Note (Addendum)
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 Currently, Status post 6 cycles of R CHOP- complete response on PET scan. S/p 4 doses x high dose MXT [finished November 2017].  # Concerning for recurrence based on symptoms- night sweats/leg cramping/palpitations/worsening fatigue/easy bruising [similar to prior presentation]. Recommend a PET scan ASAP [patient's last CT scan-October 2018/July 2018 with NED]  # Acute diverticulitis- status post ciprofloxacin and Flagyl; symptoms resolved.  #  Check CBC CMP and LDH PT PTT. PET scan ASAP.   # keep appts as scheduled; will call with PET scan results/labs.  # Reviewed the CT imaging from October 2018- no evidence of any lymphoma or splenomegaly or lymphadenopathy.

## 2017-03-21 ENCOUNTER — Other Ambulatory Visit: Payer: Self-pay | Admitting: *Deleted

## 2017-03-21 ENCOUNTER — Encounter
Admission: RE | Admit: 2017-03-21 | Discharge: 2017-03-21 | Disposition: A | Discharge status: Home / Self Care | Payer: BLUE CROSS/BLUE SHIELD | Source: Ambulatory Visit | Attending: Internal Medicine | Admitting: Internal Medicine

## 2017-03-21 ENCOUNTER — Telehealth: Payer: Self-pay | Admitting: *Deleted

## 2017-03-21 ENCOUNTER — Inpatient Hospital Stay (HOSPITAL_BASED_OUTPATIENT_CLINIC_OR_DEPARTMENT_OTHER): Payer: BLUE CROSS/BLUE SHIELD | Admitting: Internal Medicine

## 2017-03-21 DIAGNOSIS — K5732 Diverticulitis of large intestine without perforation or abscess without bleeding: Secondary | ICD-10-CM | POA: Diagnosis not present

## 2017-03-21 DIAGNOSIS — Z79899 Other long term (current) drug therapy: Secondary | ICD-10-CM | POA: Diagnosis not present

## 2017-03-21 DIAGNOSIS — C8338 Diffuse large B-cell lymphoma, lymph nodes of multiple sites: Secondary | ICD-10-CM | POA: Diagnosis present

## 2017-03-21 DIAGNOSIS — C7951 Secondary malignant neoplasm of bone: Secondary | ICD-10-CM | POA: Diagnosis not present

## 2017-03-21 DIAGNOSIS — C8583 Other specified types of non-Hodgkin lymphoma, intra-abdominal lymph nodes: Secondary | ICD-10-CM | POA: Diagnosis present

## 2017-03-21 LAB — GLUCOSE, CAPILLARY: GLUCOSE-CAPILLARY: 77 mg/dL (ref 65–99)

## 2017-03-21 MED ORDER — FLUDEOXYGLUCOSE F - 18 (FDG) INJECTION
13.0900 | Freq: Once | INTRAVENOUS | Status: AC | PRN
  Administered 2017-03-21: 13.09 via INTRAVENOUS

## 2017-03-21 NOTE — Telephone Encounter (Signed)
RN received phone call from Dr. Rogue Bussing. He spoke with Dr. Jamal Collin, who agreed to see pt tomorrow for lymph node biopsy. Ref. Order entered for lymph node biopsy. Phone call msg left for patient to contact Dr. Angie Fava office by tom to determine her apt time.

## 2017-03-22 ENCOUNTER — Encounter: Payer: Self-pay | Admitting: General Surgery

## 2017-03-22 ENCOUNTER — Inpatient Hospital Stay (INDEPENDENT_AMBULATORY_CARE_PROVIDER_SITE_OTHER): Payer: BLUE CROSS/BLUE SHIELD

## 2017-03-22 ENCOUNTER — Ambulatory Visit (INDEPENDENT_AMBULATORY_CARE_PROVIDER_SITE_OTHER): Payer: BLUE CROSS/BLUE SHIELD | Admitting: General Surgery

## 2017-03-22 VITALS — BP 130/84 | HR 87 | Resp 14 | Ht 62.0 in | Wt 176.0 lb

## 2017-03-22 DIAGNOSIS — C8338 Diffuse large B-cell lymphoma, lymph nodes of multiple sites: Secondary | ICD-10-CM

## 2017-03-22 DIAGNOSIS — Z8579 Personal history of other malignant neoplasms of lymphoid, hematopoietic and related tissues: Secondary | ICD-10-CM

## 2017-03-22 DIAGNOSIS — R590 Localized enlarged lymph nodes: Secondary | ICD-10-CM

## 2017-03-22 NOTE — Progress Notes (Signed)
Patient ID: Stacy Murillo, female   DOB: 03-17-1956, 61 y.o.   MRN: [id number]  Chief Complaint  Patient presents with  . Procedure    HPI Stacy Murillo is a 61 y.o. female here for consultation and possible biopsy of right axillary lymph node. Has a history of diffuse large B cell lymphoma, finished chemotherapy one year ago and then took methotrexate. New symptoms x 1 month: fast heart rate, shortness of breath, leg cramps, night sweats, and bruising- discussed these symptoms with oncologist who ordered a PET scan- showed hypermetabolic axillary node. She is here today with her husband Stacy Murillo.   HPI  Past Medical History:  Diagnosis Date  . Arthritis   . Blood dyscrasia   . Cancer ([place])   . Chest pain, unspecified   . Diverticulosis 07/06/15  . Dysrhythmia   . Esophagitis 07/06/15  . Fatigue   . Gastritis 07/06/15  . GERD (gastroesophageal reflux disease)   . 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 Leg cramps   . Lymphoma ([place]) 09/14/15   Monoclonal B cell lymphoma  . Night sweats   . Osteoporosis   . Overweight(278.02)    Obesity  . Palpitations   . Shortness of breath dyspnea   . Tachycardia   . Thrombocytopenia ([place])     Past Surgical History:  Procedure Laterality Date  . ABDOMINAL HYSTERECTOMY  2005  . BONE MARROW BIOPSY  09/14/15  . CHOLECYSTECTOMY  1997  . COLONOSCOPY  07/05/2014  . ESOPHAGOGASTRODUODENOSCOPY  07/05/2014  . PORTACATH PLACEMENT Right     Family History  Problem Relation Age of Onset  . Heart disease Father        CABG x 4  . Multiple myeloma Father   . Hypertension Mother   . Pancreatic cancer Mother   . Ulcers Mother   . Asthma Mother   . Brain cancer Maternal Uncle   . Multiple myeloma Maternal Uncle   . Diabetes Neg Hx   . Breast cancer Neg Hx     Social History Social History   Tobacco Use  . Smoking status: Never Smoker  . Smokeless tobacco: Never Used  Substance Use Topics  . Alcohol use: No    Alcohol/week: 0.0 oz  . Drug use: No     No Known Allergies  Current Outpatient Medications  Medication Sig Dispense Refill  . acetaminophen (TYLENOL 8 HOUR) 650 MG CR tablet Take 650 mg by mouth every 8 (eight) hours as needed for pain.    Marland Kitchen Alum Hydroxide-Mag Carbonate (GAVISCON PO) Take by mouth.    Marland Kitchen CALCIUM PO Take 600 mg 2 (two) times daily by mouth.     Javier Docker Oil 1000 MG CAPS Take by mouth.    . lidocaine-prilocaine (EMLA) cream   2  . ranitidine (ZANTAC) 150 MG tablet Take 150 mg by mouth 2 (two) times daily.    Marland Kitchen XIIDRA 5 % SOLN 1 drop.   4   No current facility-administered medications for this visit.    Facility-Administered Medications Ordered in Other Visits  Medication Dose Route Frequency Provider Last Rate Last Dose  . 0.9 %  sodium chloride infusion   Intravenous Continuous Evlyn Kanner, NP   Stopped at 10/14/15 1312  . methotrexate (50 mg/ml) 6.3 g in sodium chloride 0.9 % 1,000 mL injection   Intravenous Once Charlaine Dalton R, MD      . sodium chloride flush (NS) 0.9 % injection 10 mL  10 mL Intravenous PRN Charlaine Dalton  R, MD   10 mL at 10/05/16 1400    Review of Systems Review of Systems  Constitutional: Negative.   Respiratory: Negative.   Cardiovascular: Negative.     Blood pressure 130/84, pulse 87, resp. rate 14, height 5' 2"  (1.575 m), weight 176 lb (79.8 kg).  Physical Exam Physical Exam  Constitutional: She is oriented to person, place, and time. She appears well-developed and well-nourished.  Eyes: Conjunctivae are normal. No scleral icterus.  Neck: Neck supple.  Cardiovascular: Normal rate, regular rhythm and normal heart sounds.  Pulmonary/Chest: Effort normal and breath sounds normal.  Right subclavian port in place   Abdominal: Soft. Normal appearance and bowel sounds are normal. There is no hepatomegaly. There is no tenderness.  Non-palpable spleen.  Lymphadenopathy:    She has no cervical adenopathy.    She has axillary adenopathy (subtle right axillary  fullness).       Right: No inguinal adenopathy present.       Left: No inguinal adenopathy present.  Neurological: She is alert and oriented to person, place, and time.  Skin: Skin is warm and dry.  Psychiatric: She has a normal mood and affect.    Data Reviewed Previous notes, oncologist notes, PET scan PET scan revealed recurrent malignancy with new pathologic and hypermetabolic adenopathy in in the right axilla and subpectoral region.Splenomegaly with diffusely hypermetabolic spleen. Small hypermetabolic central mesenteric lymph nodes, and scattered new hypermetabolic lesions in the visualized skeleton. Assessment    History of diffuse large B cell lymphoma, finished chemotherapy one year ago and then took methotrexate. New symptoms x 1 month. PET scan revealed recurrent malignancy, hypermetabolic axillary and subpectoral lymph nodes. US guided core biopsy needed to confirm malignancy recurrence. Pt consented to in office right, subpectoral, axillary node biopsy.    Procedure Note Procedure: US guided right subpectoral axillary lymph node core biopsy.  [place] was performed to located a subpectoral axillary lymph node. Axillary vessels were visualized. 10 ml of 0.5%marcaine mixed with 1% xylocaine  was injected circumferentially around the biopsy site. The area was then prepped and draped in sterile fashion. US probe was covered with sterile sheath. Lymph node was visualized with [place], small incision was made, and Bard 14g device was used to obtain four core specimens which were sent to pathology. Incision closed with steri strips,telfa and tegaderm and an ice pack was applied. Pt t olerated procedure well. Pt was given wound care instructions.     Plan    Dr Rogue Bussing will call the patient with her pathology results. Pt to call if she has any questions or concerns.     HPI, Physical Exam, Assessment and Plan have been scribed under the direction and in the presence of Mckinley Jewel,  MD  Concepcion Living, LPN   I have completed the exam and reviewed the above documentation for accuracy and completeness.  I agree with the above.  Haematologist has been used and any errors in dictation or transcription are unintentional.  Seeplaputhur G. Jamal Collin, M.D., F.A.C.S.  Junie Panning G 03/22/2017, 4:12 PM

## 2017-03-22 NOTE — Patient Instructions (Signed)
CARE AFTER BIOPSY  1. Leave the dressing on that your doctor applied after the biopsy. It is waterproof. You may bathe, shower and/or swim. The dressing can be removed in 3 days, you will see small strips of tape against your skin on the incision. Do not remove these strips they will gradually fall off in about 2-3 weeks. You may use an ice pack on and off for the first 12-24 hours for comfort.  2. You may want to use a gauze,cloth or similar protection to prevent rubbing against your dressing and incision. This is not necessary, but you may feel more comfortable doing so.  3. You may have a follow up appointment or phone follow up in one week after your biopsy. The office phone number is [phone].  6. You will notice about a week or two after your office visit that the strips of the tape on your incision will begin to loosen. These may then be removed.  7. Report to your doctor any of the following:  * Severe pain not relieved by your pain medication  *Redness of the incision  * Drainage from the incision  *Fever greater than 101 degrees  Dr Rogue Bussing will call you with your results.

## 2017-03-25 ENCOUNTER — Telehealth: Payer: Self-pay | Admitting: Internal Medicine

## 2017-03-25 NOTE — Telephone Encounter (Signed)
Discussed with Dr.Sankar; biopsy done.   Please make an appt for pt tomorrow in [place] at 2:15/No labs.

## 2017-03-25 NOTE — Telephone Encounter (Signed)
Patient has been scheduled.    Thanks

## 2017-03-26 ENCOUNTER — Inpatient Hospital Stay (HOSPITAL_BASED_OUTPATIENT_CLINIC_OR_DEPARTMENT_OTHER): Payer: BLUE CROSS/BLUE SHIELD | Admitting: Internal Medicine

## 2017-03-26 ENCOUNTER — Telehealth: Payer: Self-pay | Admitting: Internal Medicine

## 2017-03-26 ENCOUNTER — Encounter: Payer: Self-pay | Admitting: Internal Medicine

## 2017-03-26 VITALS — BP 126/82 | HR 102 | Temp 97.4°F | Resp 16 | Wt 177.0 lb

## 2017-03-26 DIAGNOSIS — Z923 Personal history of irradiation: Secondary | ICD-10-CM

## 2017-03-26 DIAGNOSIS — C8338 Diffuse large B-cell lymphoma, lymph nodes of multiple sites: Secondary | ICD-10-CM | POA: Diagnosis not present

## 2017-03-26 DIAGNOSIS — C7951 Secondary malignant neoplasm of bone: Secondary | ICD-10-CM | POA: Diagnosis not present

## 2017-03-26 DIAGNOSIS — Z79899 Other long term (current) drug therapy: Secondary | ICD-10-CM | POA: Diagnosis not present

## 2017-03-26 DIAGNOSIS — C8583 Other specified types of non-Hodgkin lymphoma, intra-abdominal lymph nodes: Secondary | ICD-10-CM

## 2017-03-26 MED ORDER — ONDANSETRON HCL 8 MG PO TABS: 8.0000 mg | ORAL_TABLET | Freq: Two times a day (BID) | ORAL | 1 refills | Status: DC | PRN

## 2017-03-26 MED ORDER — ACYCLOVIR 400 MG PO TABS: 400.0000 mg | ORAL_TABLET | Freq: Two times a day (BID) | ORAL | 3 refills | Status: DC

## 2017-03-26 MED ORDER — DEXAMETHASONE 4 MG PO TABS: 40.0000 mg | ORAL_TABLET | Freq: Every day | ORAL | 3 refills | Status: DC

## 2017-03-26 MED ORDER — LIDOCAINE-PRILOCAINE 2.5-2.5 % EX CREA: TOPICAL_CREAM | CUTANEOUS | 3 refills | Status: DC

## 2017-03-26 MED ORDER — PROCHLORPERAZINE MALEATE 10 MG PO TABS: 10.0000 mg | ORAL_TABLET | Freq: Four times a day (QID) | ORAL | 1 refills | Status: DC | PRN

## 2017-03-26 NOTE — Assessment & Plan Note (Addendum)
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 Biopsy proven R ax LN]. Nov 2018- PET-shows recurrent disease in the right axilla/subpectoral region; also significant splenic uptake; multiple bone lesions.  #Long discussion regarding the significance of recurrence.  However given her good performance status; aggressive treatment-including plan for transplant is recommended.   #Given the non-GCB subtype/outpatient treatment preferred-we will plan R-GDP chemotherapy; every 21 days.  We will plan repeating a scan after 2 cycles; likely stem cell transplant after 3 cycles.  We will make a referral to Dr[facility]Shea [facility].   # Discussed the potential side effects including but not limited to-increasing fatigue, nausea vomiting, diarrhea, hair loss, sores in the mouth, increase risk of infection and also neuropathy.   # Growth factor-Neulasta/On pro would be given as prophylaxis for chemotherapy-induced neutropenia to prevent febrile neutropenias. Discussed potential side effect- myalgias/arthralgias- recommend Claritin for 4 days.   # Recommend prophylactic acyclovir.  Given the absence of significant bulk-hold off allopurinol.  # plan starting chemo on 11/16; will plan d-8 on 11/26 [sec to holidays].  # will add labs/bmp/ldh; hepatitis labs for 11/16. Send a referral to [facility] BMT, Dr.Shea.

## 2017-03-26 NOTE — Telephone Encounter (Signed)
Please add a lab schedule-11/16-day of chemotherapy [orders in the computer]; also make a referral to Dr.Shea; [facility] bone marrow transplant team [Dx: relalpsed Lymphoma- ASAP]

## 2017-03-26 NOTE — Progress Notes (Signed)
Lymphoma and CLL - No Medical Intervention - Off Treatment.    Patient Characteristics:  Diffuse Large B-Cell Lymphoma, Relapsed / Refractory, All Stages,  Second Line, Transplant Candidate  Disease Type: Not Applicable  Disease Type: Diffuse Large B-Cell Lymphoma  Disease Type: Not Applicable  Line of therapy: Relapsed / Refractory - Second Line  [place] Stage: IV  Patient Characteristics: Transplant Candidate

## 2017-03-26 NOTE — Progress Notes (Signed)
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 CT- 1-2 CM LN subpectoral/medistinal/ retro-peritoneal/pelvic/ Right inguinal LN 1.6cm- Bx- DLBCL- ABC; myc-POS; FISH gene re-arragement-NEG. PET- MULTIPLE LN/ Bone involvement; R-CHOP x6- CR'# OCT 2017- PET scan- CR; DEC 22nd 2017- BMBx- "atypical large B cells- <1%  [[facility]; II opinion- Dr.Grover- review of path- not concerning for residual lymphoma]; recommend surviellance  # March 26th  2018PET- NED  # Lumbar puncture- difficult-spinal headache. S/p high dose MXT x4.   # June 2017-Elevated LFTs- valacylovir/diflucan; resolved.   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  Patient continues to  complain of leg cramping; heart flutter.  Denies any significant lumps or bumps.  Overall she feels poorly.  However she is fully functional.  Denies any cough or shortness of breath or chest pain.   REVIEW OF SYSTEMS:  A complete 10 point review of system is done which is negative except mentioned above/history of present illness.   PAST MEDICAL HISTORY :  Past Medical History:  Diagnosis Date  . Arthritis   . Blood dyscrasia   . Cancer (HCC)   . Chest pain, unspecified   . Diverticulosis 07/06/15  . Dysrhythmia   . Esophagitis 07/06/15  . Fatigue   . Gastritis 07/06/15  . GERD (gastroesophageal reflux disease)   . 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 Leg cramps   . 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 Night sweats   . Osteoporosis   . Overweight(278.02)    Obesity  . Palpitations   . Shortness of breath dyspnea   . Tachycardia   . Thrombocytopenia (HCC)     PAST SURGICAL HISTORY :   Past Surgical History:  Procedure Laterality Date  . ABDOMINAL HYSTERECTOMY  2005  . BONE MARROW BIOPSY  09/14/15  . CHOLECYSTECTOMY  1997  . COLONOSCOPY  07/05/2014  . ESOPHAGOGASTRODUODENOSCOPY  07/05/2014  . PORTACATH PLACEMENT Right     FAMILY HISTORY :   Family History  Problem Relation Age of Onset  . Heart disease Father        CABG x 4  . Multiple myeloma Father   . Hypertension Mother   . Pancreatic cancer Mother   . Ulcers Mother   . Asthma   Mother   . Brain cancer Maternal Uncle   . Multiple myeloma Maternal Uncle   . Diabetes Neg Hx   . Breast cancer Neg Hx     SOCIAL HISTORY:   Social History   Tobacco Use  . Smoking status: Never Smoker  . Smokeless tobacco: Never Used  Substance Use Topics  . Alcohol use: No    Alcohol/week: 0.0 oz  . Drug use: No    ALLERGIES:  has No Known Allergies.  MEDICATIONS:  Current Outpatient Medications  Medication Sig Dispense Refill  . acetaminophen (TYLENOL 8 HOUR) 650 MG CR tablet Take 650 mg by mouth every 8  (eight) hours as needed for pain.    . Alum Hydroxide-Mag Carbonate (GAVISCON PO) Take by mouth.    . CALCIUM PO Take 600 mg 2 (two) times daily by mouth.     . Krill Oil 1000 MG CAPS Take by mouth.    . lidocaine-prilocaine (EMLA) cream   2  . ranitidine (ZANTAC) 150 MG tablet Take 150 mg by mouth 2 (two) times daily.    . XIIDRA 5 % SOLN 1 drop.   4  . acyclovir (ZOVIRAX) 400 MG tablet Take 1 tablet (400 mg total) 2 (two) times daily by mouth. One pill a day [to prevent shingles] 60 tablet 3  . dexamethasone (DECADRON) 4 MG tablet Take 10 tablets (40 mg total) daily by mouth. Start the day after cisplatin chemotherapy for 3 days. 30 tablet 3  . lidocaine-prilocaine (EMLA) cream Apply to affected area once 30 g 3  . ondansetron (ZOFRAN) 8 MG tablet Take 1 tablet (8 mg total) 2 (two) times daily as needed by mouth. Start on the 3rd day after cisplatin chemotherapy. 30 tablet 1  . prochlorperazine (COMPAZINE) 10 MG tablet Take 1 tablet (10 mg total) every 6 (six) hours as needed by mouth (Nausea or vomiting). 30 tablet 1   No current facility-administered medications for this visit.    Facility-Administered Medications Ordered in Other Visits  Medication Dose Route Frequency Provider Last Rate Last Dose  . 0.9 %  sodium chloride infusion   Intravenous Continuous Herring, Leslie F, NP   Stopped at 10/14/15 1312  . methotrexate (50 mg/ml) 6.3 g in sodium chloride 0.9 % 1,000 mL injection   Intravenous Once Brahmanday, Govinda R, MD      . sodium chloride flush (NS) 0.9 % injection 10 mL  10 mL Intravenous PRN Brahmanday, Govinda R, MD   10 mL at 10/05/16 1400    PHYSICAL EXAMINATION: ECOG PERFORMANCE STATUS: 1 - Symptomatic but completely ambulatory  BP 126/82 (BP Location: Left Arm, Patient Position: Sitting)   Pulse (!) 102   Temp (!) 97.4 F (36.3 C) (Tympanic)   Resp 16   Wt 177 lb 0.5 oz (80.3 kg)   BMI 32.38 kg/m   Filed Weights   03/26/17 1414  Weight: 177 lb 0.5 oz (80.3 kg)     GENERAL: Well-nourished well-developed; Alert, no distress and comfortable.   Accompanied by her husband.  EYES: no pallor or icterus OROPHARYNX: no thrush; good dentition  NECK: supple, no masses felt LYMPH:  no palpable lymphadenopathy in the cervical, axillary or inguinal regions LUNGS: clear to auscultation and  No wheeze or crackles HEART/CVS: regular rate & rhythm and no murmurs; No lower extremity edema. ABDOMEN:abdomen soft, non-tender and normal bowel sounds; positive for splenomegaly. Musculoskeletal:no cyanosis of digits and no clubbing  PSYCH: alert & oriented x 3 with fluent speech NEURO: no   focal motor/sensory deficits SKIN:  no rashes or significant lesions; MediPort in place. LABORATORY DATA:  I have reviewed the data as listed    Component Value Date/Time   NA 138 03/19/2017 1512   NA 139 06/29/2010 0749   K 4.2 03/19/2017 1512   CL 100 (L) 03/19/2017 1512   CO2 28 03/19/2017 1512   GLUCOSE 89 03/19/2017 1512   BUN 15 03/19/2017 1512   BUN 16 06/29/2010 0749   CREATININE 0.84 03/19/2017 1512   CALCIUM 9.6 03/19/2017 1512   PROT 7.3 03/19/2017 1512   ALBUMIN 4.4 03/19/2017 1512   AST 37 03/19/2017 1512   ALT 46 03/19/2017 1512   ALKPHOS 118 03/19/2017 1512   BILITOT 0.5 03/19/2017 1512   GFRNONAA >60 03/19/2017 1512   GFRAA >60 03/19/2017 1512    No results found for: SPEP, UPEP  Lab Results  Component Value Date   WBC 4.6 03/19/2017   NEUTROABS 2.7 03/19/2017   HGB 13.4 03/19/2017   HCT 39.0 03/19/2017   MCV 83.1 03/19/2017   PLT 97 (L) 03/19/2017      Chemistry      Component Value Date/Time   NA 138 03/19/2017 1512   NA 139 06/29/2010 0749   K 4.2 03/19/2017 1512   CL 100 (L) 03/19/2017 1512   CO2 28 03/19/2017 1512   BUN 15 03/19/2017 1512   BUN 16 06/29/2010 0749   CREATININE 0.84 03/19/2017 1512   GLU 93 06/29/2010 0749      Component Value Date/Time   CALCIUM 9.6 03/19/2017 1512   ALKPHOS 118 03/19/2017 1512   AST 37  03/19/2017 1512   ALT 46 03/19/2017 1512   BILITOT 0.5 03/19/2017 1512     IMPRESSION: Acute sigmoid diverticulitis affecting the distal sigmoid colon. No abscess.   Electronically Signed   By: Nelson Chimes M.D.   On: 02/15/2017 13:44  RADIOGRAPHIC STUDIES: I have personally reviewed the radiological images as listed and agreed with the findings in the report. No results found.   ASSESSMENT & PLAN:  Diffuse large B-cell lymphoma of lymph nodes of multiple regions ([place]) # RELAPSED Diffuse large B-cell lymphoma ABC subtype/ leukemic cells noted in the blood [initial may 2017]. Biopsy proven R ax LN]. Nov 2018- PET-shows recurrent disease in the right axilla/subpectoral region; also significant splenic uptake; multiple bone lesions.  #Long discussion regarding the significance of recurrence.  However given her good performance status; aggressive treatment-including plan for transplant is recommended.   #Given the non-GCB subtype/outpatient treatment preferred-we will plan R-GDP chemotherapy; every 21 days.  We will plan repeating a scan after 2 cycles; likely stem cell transplant after 3 cycles.  We will make a referral to Dr.Shea [facility].   # Discussed the potential side effects including but not limited to-increasing fatigue, nausea vomiting, diarrhea, hair loss, sores in the mouth, increase risk of infection and also neuropathy.   # Growth factor-Neulasta/On pro would be given as prophylaxis for chemotherapy-induced neutropenia to prevent febrile neutropenias. Discussed potential side effect- myalgias/arthralgias- recommend Claritin for 4 days.   # Recommend prophylactic acyclovir.  Given the absence of significant bulk-hold off allopurinol.  # plan starting chemo on 11/16; will plan d-8 on 11/26 [sec to holidays].  # will add labs/bmp/ldh; hepatitis labs for 11/16. Send a referral to [facility] BMT, Dr.Shea.    Orders Placed This Encounter  Procedures  . CBC with Differential     Standing Status:   Future    Standing Expiration Date:  03/26/2018  . Comprehensive metabolic panel    Standing Status:   Future    Standing Expiration Date:   04/30/2018  . CBC with Differential/Platelet    Please do manual diff- and call if abnormal cells seen    Standing Status:   Future    Standing Expiration Date:   04/30/2018  . Lactate dehydrogenase    Standing Status:   Future    Standing Expiration Date:   04/30/2018  . Hepatitis B core antibody, IgM    Standing Status:   Future    Standing Expiration Date:   04/30/2018  . Hepatitis B surface antigen    Standing Status:   Future    Standing Expiration Date:   04/30/2018  . Basic metabolic panel    Standing Status:   Future    Standing Expiration Date:   04/30/2018     Govinda R Brahmanday, MD 03/26/2017 4:10 PM 

## 2017-03-26 NOTE — Assessment & Plan Note (Addendum)
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 Currently, Status post 6 cycles of R CHOP- complete response on PET scan. S/p 4 doses x high dose MXT [finished November 2017].  # Concerning for recurrence based on symptoms- night sweats/leg cramping/palpitations/worsening fatigue/easy bruising [similar to prior presentation]. 9th November 2018 PET scan unfortunately shows-recurrence Right axillary/pectoral; rib metastases/vertebral metastases; also splenic involvement.  CBC remarkable for platelets 97; LDH elevated at 638; normal renal function/hepatic function.  #I had a long discussion with the patient regarding the seriousness of the disease.  I would recommend high-dose chemotherapy- RGDP vs RICE-followed by autologous stem cell transplant.  Discussed with Dr[facility] Thera Flake at [facility].   #I spoke with Dr. Henrene Pastor kindly agrees to have a lymph node biopsy done/ultrasound guidance.  Further management based upon the results of the workup.   # I reviewed the blood work- with the patient in detail; also reviewed the imaging independently [as summarized above]; and with the patient in detail.  # 25 minutes face-to-face with the patient discussing the above plan of care; more than 50% of time spent on prognosis/ natural history; counseling and coordination.

## 2017-03-26 NOTE — Progress Notes (Signed)
DISCONTINUE ON PATHWAY REGIMEN - Lymphoma and CLL  No Medical Intervention - Off Treatment.  REASON: Other Reason PRIOR TREATMENT: Off Treatment  START ON PATHWAY REGIMEN - Lymphoma and CLL     A cycle is every 21 days:     Dexamethasone      Gemcitabine      Cisplatin      Rituximab   **Always confirm dose/schedule in your pharmacy ordering system**    Patient Characteristics: Diffuse Large B-Cell Lymphoma, Relapsed / Refractory, All Stages,  Second Line, Transplant Candidate Disease Type: Not Applicable Disease Type: Diffuse Large B-Cell Lymphoma Disease Type: Not Applicable Line of therapy: Relapsed / Refractory - Second Line [place] Stage: IV Patient Characteristics: Transplant Candidate Intent of Therapy: Curative Intent, Discussed with Patient

## 2017-03-26 NOTE — Progress Notes (Signed)
[place] OFFICE PROGRESS NOTE  Patient Care Team: Crecencio Mc, MD as PCP - General (Internal Medicine) Cammie Sickle, MD as Consulting Physician (Internal Medicine) Christene Lye, MD (General Surgery) [name], MD as Consulting Physician (Cardiology)  Large cell lymphoma of intra-abdominal lymph nodes [facility])   Staging form: Lymphoid Neoplasms, AJCC 6th Edition     Clinical stage from 09/13/2015: Stage IV - Signed by Cammie Sickle, MD on 10/06/2015    Oncology History   #MAY 2017- LARGE B CELL LYMPHOMA with intravascular features STAGE IV- [BMBx- hypercellular- lymphoproliferative process is mostly seen within small vessels in the bone marrow as well as the surrounding interstitium associated with circulating lymphoma cells in the peripheral blood; ]. CT- 1-2 CM LN subpectoral/medistinal/ retro-peritoneal/pelvic/ Right inguinal LN 1.6cm- Bx- DLBCL- ABC; myc-POS; FISH gene re-arragement-NEG. PET- MULTIPLE LN/ Bone involvement; R-CHOP x6- CR'# OCT 2017- PET scan- CR; DEC 22nd 2017- BMBx- "atypical large B cells- <1%  [[facility]; II opinion- Dr.Grover- review of path- not concerning for residual lymphoma]; recommend surviellance  # March 26th  2018PET- NED  # Lumbar puncture- difficult-spinal headache. S/p high dose MXT x4.   # June 2017-Elevated LFTs- valacylovir/diflucan; resolved.   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  She unfortunately continues to have intermittent leg cramping; episodes of night sweats and palpitations.  Poor appetite.  No weight loss.  Denies any cough or shortness of breath or chest pain.   REVIEW OF SYSTEMS:  A complete 10 point review of system is done which is negative except mentioned above/history of present illness.   PAST MEDICAL HISTORY :  Past Medical History:  Diagnosis Date  . Arthritis   . Blood dyscrasia   . Cancer ([place])   . Chest pain, unspecified   . Diverticulosis 07/06/15  . Dysrhythmia   . Esophagitis 07/06/15  . Fatigue   . Gastritis 07/06/15  . GERD (gastroesophageal reflux disease)   . 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 Leg cramps   . Lymphoma ([place]) 09/14/15   Monoclonal B cell lymphoma  . Night sweats   . Osteoporosis   . Overweight(278.02)    Obesity  . Palpitations   . Shortness of breath dyspnea   . Tachycardia   . Thrombocytopenia ([place])     PAST SURGICAL HISTORY :   Past Surgical History:  Procedure Laterality Date  . ABDOMINAL HYSTERECTOMY  2005  . BONE MARROW BIOPSY  09/14/15  . CHOLECYSTECTOMY  1997  . COLONOSCOPY  07/05/2014  . ESOPHAGOGASTRODUODENOSCOPY  07/05/2014  . PORTACATH PLACEMENT Right     FAMILY HISTORY :   Family History  Problem Relation Age of Onset  . Heart disease Father        CABG x 4  . Multiple myeloma Father   . Hypertension Mother   . Pancreatic cancer Mother   .  Ulcers Mother   . Asthma Mother   . Brain cancer Maternal Uncle   . Multiple myeloma Maternal Uncle   . Diabetes Neg Hx   . Breast cancer Neg Hx     SOCIAL HISTORY:   Social History   Tobacco Use  . Smoking status: Never Smoker  . Smokeless tobacco: Never Used  Substance Use Topics  . Alcohol use: No    Alcohol/week: 0.0 oz  . Drug use: No    ALLERGIES:  has No Known Allergies.  MEDICATIONS:  Current Outpatient Medications  Medication Sig Dispense Refill  . acetaminophen (TYLENOL 8  HOUR) 650 MG CR tablet Take 650 mg by mouth every 8 (eight) hours as needed for pain.    Marland Kitchen Alum Hydroxide-Mag Carbonate (GAVISCON PO) Take by mouth.    Marland Kitchen CALCIUM PO Take 600 mg 2 (two) times daily by mouth.     Javier Docker Oil 1000 MG CAPS Take by mouth.    . lidocaine-prilocaine (EMLA) cream   2  . ranitidine (ZANTAC) 150 MG tablet Take 150 mg by mouth 2 (two) times daily.    Marland Kitchen XIIDRA 5 % SOLN 1 drop.   4  . acyclovir (ZOVIRAX) 400 MG tablet Take 1 tablet (400 mg total) 2 (two) times daily by mouth. One pill a day [to prevent shingles] 60 tablet 3  . dexamethasone (DECADRON) 4 MG tablet Take 10 tablets (40 mg total) daily by mouth. Start the day after cisplatin chemotherapy for 3 days. 30 tablet 3  . lidocaine-prilocaine (EMLA) cream Apply to affected area once 30 g 3  . ondansetron (ZOFRAN) 8 MG tablet Take 1 tablet (8 mg total) 2 (two) times daily as needed by mouth. Start on the 3rd day after cisplatin chemotherapy. 30 tablet 1  . prochlorperazine (COMPAZINE) 10 MG tablet Take 1 tablet (10 mg total) every 6 (six) hours as needed by mouth (Nausea or vomiting). 30 tablet 1   No current facility-administered medications for this visit.    Facility-Administered Medications Ordered in Other Visits  Medication Dose Route Frequency Provider Last Rate Last Dose  . 0.9 %  sodium chloride infusion   Intravenous Continuous Evlyn Kanner, NP   Stopped at 10/14/15 1312  . methotrexate (50 mg/ml) 6.3 g in sodium chloride 0.9 % 1,000 mL injection   Intravenous Once Charlaine Dalton R, MD      . sodium chloride flush (NS) 0.9 % injection 10 mL  10 mL Intravenous PRN Cammie Sickle, MD   10 mL at 10/05/16 1400    PHYSICAL EXAMINATION: ECOG PERFORMANCE STATUS: 1 - Symptomatic but completely ambulatory  BP 136/85 (BP Location: Left Arm, Patient Position: Sitting)   Pulse (!) 102   Temp 98.6 F (37 C) (Tympanic)   Resp 16   There were no vitals filed for this visit.  GENERAL:  Well-nourished well-developed; Alert, no distress and comfortable.   Accompanied by her sister-in-law. EYES: no pallor or icterus OROPHARYNX: no thrush; good dentition  NECK: supple, no masses felt LYMPH:  no palpable lymphadenopathy in the cervical, axillary or inguinal regions LUNGS: clear to auscultation and  No wheeze or crackles HEART/CVS: regular rate & rhythm and no murmurs; No lower extremity edema. ABDOMEN:abdomen soft, non-tender and normal bowel sounds; mild splenomegaly. Musculoskeletal:no cyanosis of digits and no clubbing  PSYCH: alert & oriented x 3 with fluent speech NEURO: no focal motor/sensory deficits SKIN:  no rashes or significant lesions; MediPort in place. LABORATORY DATA:  I have reviewed  the data as listed    Component Value Date/Time   NA 138 03/19/2017 1512   NA 139 06/29/2010 0749   K 4.2 03/19/2017 1512   CL 100 (L) 03/19/2017 1512   CO2 28 03/19/2017 1512   GLUCOSE 89 03/19/2017 1512   BUN 15 03/19/2017 1512   BUN 16 06/29/2010 0749   CREATININE 0.84 03/19/2017 1512   CALCIUM 9.6 03/19/2017 1512   PROT 7.3 03/19/2017 1512   ALBUMIN 4.4 03/19/2017 1512   AST 37 03/19/2017 1512   ALT 46 03/19/2017 1512   ALKPHOS 118 03/19/2017 1512   BILITOT 0.5 03/19/2017 1512   GFRNONAA >60 03/19/2017 1512   GFRAA >60 03/19/2017 1512    No results found for: SPEP, UPEP  Lab Results  Component Value Date   WBC 4.6 03/19/2017   NEUTROABS 2.7 03/19/2017   HGB 13.4 03/19/2017   HCT 39.0 03/19/2017   MCV 83.1 03/19/2017   PLT 97 (L) 03/19/2017      Chemistry      Component Value Date/Time   NA 138 03/19/2017 1512   NA 139 06/29/2010 0749   K 4.2 03/19/2017 1512   CL 100 (L) 03/19/2017 1512   CO2 28 03/19/2017 1512   BUN 15 03/19/2017 1512   BUN 16 06/29/2010 0749   CREATININE 0.84 03/19/2017 1512   GLU 93 06/29/2010 0749      Component Value Date/Time   CALCIUM 9.6 03/19/2017 1512   ALKPHOS 118 03/19/2017 1512   AST 37 03/19/2017 1512   ALT 46  03/19/2017 1512   BILITOT 0.5 03/19/2017 1512     IMPRESSION: Acute sigmoid diverticulitis affecting the distal sigmoid colon. No abscess.   Electronically Signed   By: Nelson Chimes M.D.   On: 02/15/2017 13:44  RADIOGRAPHIC STUDIES: I have personally reviewed the radiological images as listed and agreed with the findings in the report. No results found.   ASSESSMENT & PLAN:  Diffuse large B-cell lymphoma of lymph nodes of multiple regions ([place]) # Diffuse large B-cell lymphoma ABC subtype/ leukemic cells noted in the blood [may 2017]. Currently, Status post 6 cycles of R CHOP- complete response on PET scan. S/p 4 doses x high dose MXT [finished November 2017].  # Concerning for recurrence based on symptoms- night sweats/leg cramping/palpitations/worsening fatigue/easy bruising [similar to prior presentation]. 9th November 2018 PET scan unfortunately shows-recurrence Right axillary/pectoral; rib metastases/vertebral metastases; also splenic involvement.  CBC remarkable for platelets 97; LDH elevated at 638; normal renal function/hepatic function.  #I had a long discussion with the patient regarding the seriousness of the disease.  I would recommend high-dose chemotherapy- RGDP vs RICE-followed by autologous stem cell transplant.  Discussed with Dr. Thera Flake at [facility].   #I spoke with Dr. Henrene Pastor kindly agrees to have a lymph node biopsy done/ultrasound guidance.  Further management based upon the results of the workup.   # I reviewed the blood work- with the patient in detail; also reviewed the imaging independently [as summarized above]; and with the patient in detail.    No orders of the defined types were placed in this encounter.    Cammie Sickle, MD 03/26/2017 7:08 PM

## 2017-03-26 NOTE — Progress Notes (Signed)
DISCONTINUE ON PATHWAY REGIMEN - Lymphoma and CLL  No Medical Intervention - Off Treatment.  PRIOR TREATMENT: LY208: Referral to [place] No Medical Intervention - Off Treatment.  Patient Characteristics: Diffuse Large B-Cell Lymphoma, Relapsed / Refractory, All Stages,  Second Line, Transplant Candidate Disease Type: Not Applicable Disease Type: Diffuse Large B-Cell Lymphoma Disease Type: Not Applicable Line of therapy: Relapsed / Refractory - Second Line [place] Stage: IV Patient Characteristics: Transplant Candidate

## 2017-03-27 ENCOUNTER — Other Ambulatory Visit: Payer: Self-pay | Admitting: Internal Medicine

## 2017-03-27 ENCOUNTER — Telehealth: Payer: Self-pay | Admitting: Internal Medicine

## 2017-03-27 DIAGNOSIS — C8338 Diffuse large B-cell lymphoma, lymph nodes of multiple sites: Secondary | ICD-10-CM

## 2017-03-27 NOTE — Telephone Encounter (Signed)
I have placed the referral to Dr. Ok Edwards at [facility].

## 2017-03-27 NOTE — Telephone Encounter (Signed)
Lab appt has been scheduled for 03/29/17.

## 2017-03-27 NOTE — Telephone Encounter (Signed)
FYI-  Spoke to Dr.Shae re: transplant evaluation; agrees with R-GDP; approved by insurance. Will plan to start this week.   Made a referral to Dr.Shae; [facility] Transplant team

## 2017-03-28 ENCOUNTER — Other Ambulatory Visit: Payer: Self-pay | Admitting: Internal Medicine

## 2017-03-28 NOTE — Progress Notes (Signed)
Orders are signed separately.

## 2017-03-28 NOTE — Telephone Encounter (Signed)
Per prior auth team- insurance has approved chemotherapy regimen

## 2017-03-29 ENCOUNTER — Inpatient Hospital Stay: Payer: BLUE CROSS/BLUE SHIELD

## 2017-03-29 VITALS — BP 109/70 | HR 88 | Temp 97.6°F | Resp 16

## 2017-03-29 DIAGNOSIS — C8583 Other specified types of non-Hodgkin lymphoma, intra-abdominal lymph nodes: Secondary | ICD-10-CM

## 2017-03-29 DIAGNOSIS — C8338 Diffuse large B-cell lymphoma, lymph nodes of multiple sites: Secondary | ICD-10-CM

## 2017-03-29 LAB — BASIC METABOLIC PANEL
Anion gap: 13 (ref 5–15)
BUN: 13 mg/dL (ref 6–20)
CALCIUM: 9.3 mg/dL (ref 8.9–10.3)
CHLORIDE: 102 mmol/L (ref 101–111)
CO2: 23 mmol/L (ref 22–32)
CREATININE: 1.01 mg/dL — AB (ref 0.44–1.00)
GFR calc Af Amer: >60 mL/min (ref >60)
GFR calc non Af Amer: 58 mL/min — ABNORMAL LOW (ref >60)
Glucose, Bld: 145 mg/dL — ABNORMAL HIGH (ref 65–99)
Potassium: 3.5 mmol/L (ref 3.5–5.1)
Sodium: 138 mmol/L (ref 135–145)

## 2017-03-29 LAB — LACTATE DEHYDROGENASE: LDH: 688 U/L — ABNORMAL HIGH (ref 98–192)

## 2017-03-29 LAB — CBC WITH DIFFERENTIAL/PLATELET
BASOS PCT: 1 %
Basophils Absolute: 0 10*3/uL (ref 0–0.1)
EOS PCT: 1 %
Eosinophils Absolute: 0 10*3/uL (ref 0–0.7)
HCT: 36.6 % (ref 35.0–47.0)
HEMOGLOBIN: 12.5 g/dL (ref 12.0–16.0)
LYMPHS PCT: 19 %
Lymphs Abs: 0.5 10*3/uL — ABNORMAL LOW (ref 1.0–3.6)
MCH: 28.7 pg (ref 26.0–34.0)
MCHC: 34.3 g/dL (ref 32.0–36.0)
MCV: 83.8 fL (ref 80.0–100.0)
MONO ABS: 0.8 10*3/uL (ref 0.2–0.9)
Monocytes Relative: 28 %
NEUTROS PCT: 51 %
Neutro Abs: 1.5 10*3/uL (ref 1.4–6.5)
PLATELETS: 73 10*3/uL — AB (ref 150–440)
RBC: 4.37 MIL/uL (ref 3.80–5.20)
RDW: 13.8 % (ref 11.5–14.5)
WBC: 2.8 10*3/uL — AB (ref 3.6–11.0)

## 2017-03-29 MED ORDER — SODIUM CHLORIDE 0.9 % IV SOLN
75.0000 mg/m2 | Freq: Once | INTRAVENOUS | Status: AC
  Administered 2017-03-29: 140 mg via INTRAVENOUS
  Filled 2017-03-29: qty 140

## 2017-03-29 MED ORDER — HEPARIN SOD (PORK) LOCK FLUSH 100 UNIT/ML IV SOLN
500.0000 [IU] | Freq: Once | INTRAVENOUS | Status: DC | PRN
  Filled 2017-03-29: qty 5

## 2017-03-29 MED ORDER — PALONOSETRON HCL INJECTION 0.25 MG/5ML
0.2500 mg | Freq: Once | INTRAVENOUS | Status: AC
  Administered 2017-03-29: 0.25 mg via INTRAVENOUS
  Filled 2017-03-29: qty 5

## 2017-03-29 MED ORDER — HEPARIN SOD (PORK) LOCK FLUSH 100 UNIT/ML IV SOLN
500.0000 [IU] | Freq: Once | INTRAVENOUS | Status: AC
Start: 2017-03-29 — End: 2017-03-29
  Administered 2017-03-29: 500 [IU] via INTRAVENOUS

## 2017-03-29 MED ORDER — POTASSIUM CHLORIDE 2 MEQ/ML IV SOLN
Freq: Once | INTRAVENOUS | Status: AC
  Administered 2017-03-29: 10:00:00 via INTRAVENOUS
  Filled 2017-03-29: qty 1000

## 2017-03-29 MED ORDER — GEMCITABINE HCL CHEMO INJECTION 1 GM/26.3ML
1800.0000 mg | Freq: Once | INTRAVENOUS | Status: AC
  Administered 2017-03-29: 1800 mg via INTRAVENOUS
  Filled 2017-03-29: qty 26.3

## 2017-03-29 MED ORDER — FOSAPREPITANT DIMEGLUMINE INJECTION 150 MG
Freq: Once | INTRAVENOUS | Status: AC
  Administered 2017-03-29: 12:00:00 via INTRAVENOUS
  Filled 2017-03-29: qty 5

## 2017-03-29 MED ORDER — SODIUM CHLORIDE 0.9% FLUSH
10.0000 mL | Freq: Once | INTRAVENOUS | Status: AC
  Administered 2017-03-29: 10 mL via INTRAVENOUS
  Filled 2017-03-29: qty 10

## 2017-03-29 MED ORDER — SODIUM CHLORIDE 0.9 % IV SOLN
Freq: Once | INTRAVENOUS | Status: AC
  Administered 2017-03-29: 09:00:00 via INTRAVENOUS
  Filled 2017-03-29: qty 1000

## 2017-03-29 NOTE — Progress Notes (Signed)
Patient to start new treatment today, lab results outside of treatment parameters (Plts 73).  DrBrahmanday notified, verbal order to proceed with treatment.

## 2017-03-30 LAB — HEPATITIS B CORE ANTIBODY, IGM: HEP B C IGM: NEGATIVE

## 2017-03-30 LAB — HEPATITIS B SURFACE ANTIGEN: HEP B S AG: NEGATIVE

## 2017-04-01 ENCOUNTER — Inpatient Hospital Stay: Payer: BLUE CROSS/BLUE SHIELD

## 2017-04-01 VITALS — BP 120/77 | HR 54 | Temp 97.7°F | Resp 20 | Wt 177.0 lb

## 2017-04-01 DIAGNOSIS — C8338 Diffuse large B-cell lymphoma, lymph nodes of multiple sites: Secondary | ICD-10-CM

## 2017-04-01 MED ORDER — DIPHENHYDRAMINE HCL 25 MG PO CAPS
50.0000 mg | ORAL_CAPSULE | Freq: Once | ORAL | Status: AC
  Administered 2017-04-01: 50 mg via ORAL
  Filled 2017-04-01: qty 2

## 2017-04-01 MED ORDER — SODIUM CHLORIDE 0.9 % IV SOLN
Freq: Once | INTRAVENOUS | Status: AC
  Administered 2017-04-01: 09:00:00 via INTRAVENOUS
  Filled 2017-04-01: qty 1000

## 2017-04-01 MED ORDER — ACETAMINOPHEN 325 MG PO TABS
650.0000 mg | ORAL_TABLET | Freq: Once | ORAL | Status: AC
  Administered 2017-04-01: 650 mg via ORAL
  Filled 2017-04-01: qty 2

## 2017-04-01 MED ORDER — SODIUM CHLORIDE 0.9% FLUSH
10.0000 mL | INTRAVENOUS | Status: DC | PRN
  Administered 2017-04-01: 10 mL
  Filled 2017-04-01: qty 10

## 2017-04-01 MED ORDER — HEPARIN SOD (PORK) LOCK FLUSH 100 UNIT/ML IV SOLN
500.0000 [IU] | Freq: Once | INTRAVENOUS | Status: AC | PRN
  Administered 2017-04-01: 500 [IU]
  Filled 2017-04-01: qty 5

## 2017-04-01 MED ORDER — SODIUM CHLORIDE 0.9 % IV SOLN
375.0000 mg/m2 | Freq: Once | INTRAVENOUS | Status: AC
  Administered 2017-04-01: 700 mg via INTRAVENOUS
  Filled 2017-04-01: qty 50

## 2017-04-08 ENCOUNTER — Other Ambulatory Visit: Payer: Self-pay

## 2017-04-08 ENCOUNTER — Inpatient Hospital Stay: Payer: BLUE CROSS/BLUE SHIELD

## 2017-04-08 ENCOUNTER — Inpatient Hospital Stay (HOSPITAL_BASED_OUTPATIENT_CLINIC_OR_DEPARTMENT_OTHER): Payer: BLUE CROSS/BLUE SHIELD | Admitting: Internal Medicine

## 2017-04-08 VITALS — BP 130/80 | HR 67 | Temp 97.8°F | Resp 20 | Ht 62.0 in | Wt 178.0 lb

## 2017-04-08 DIAGNOSIS — R161 Splenomegaly, not elsewhere classified: Secondary | ICD-10-CM | POA: Diagnosis not present

## 2017-04-08 DIAGNOSIS — C8338 Diffuse large B-cell lymphoma, lymph nodes of multiple sites: Secondary | ICD-10-CM

## 2017-04-08 DIAGNOSIS — Z923 Personal history of irradiation: Secondary | ICD-10-CM

## 2017-04-08 DIAGNOSIS — C8583 Other specified types of non-Hodgkin lymphoma, intra-abdominal lymph nodes: Secondary | ICD-10-CM

## 2017-04-08 DIAGNOSIS — Z79899 Other long term (current) drug therapy: Secondary | ICD-10-CM | POA: Diagnosis not present

## 2017-04-08 DIAGNOSIS — C7951 Secondary malignant neoplasm of bone: Secondary | ICD-10-CM

## 2017-04-08 DIAGNOSIS — D696 Thrombocytopenia, unspecified: Secondary | ICD-10-CM | POA: Diagnosis not present

## 2017-04-08 LAB — CBC WITH DIFFERENTIAL/PLATELET
BASOS ABS: 0 10*3/uL (ref 0–0.1)
BASOS PCT: 0 %
Eosinophils Absolute: 0 10*3/uL (ref 0–0.7)
Eosinophils Relative: 1 %
HEMATOCRIT: 31.7 % — AB (ref 35.0–47.0)
HEMOGLOBIN: 10.9 g/dL — AB (ref 12.0–16.0)
LYMPHS PCT: 26 %
Lymphs Abs: 0.8 10*3/uL — ABNORMAL LOW (ref 1.0–3.6)
MCH: 28.5 pg (ref 26.0–34.0)
MCHC: 34.2 g/dL (ref 32.0–36.0)
MCV: 83.3 fL (ref 80.0–100.0)
MONOS PCT: 6 %
Monocytes Absolute: 0.2 10*3/uL (ref 0.2–0.9)
NEUTROS PCT: 67 %
Neutro Abs: 2.1 10*3/uL (ref 1.4–6.5)
Platelets: 46 10*3/uL — ABNORMAL LOW (ref 150–440)
RBC: 3.81 MIL/uL (ref 3.80–5.20)
RDW: 13.2 % (ref 11.5–14.5)
WBC: 3.1 10*3/uL — ABNORMAL LOW (ref 3.6–11.0)

## 2017-04-08 LAB — COMPREHENSIVE METABOLIC PANEL
ALT: 37 U/L (ref 14–54)
AST: 21 U/L (ref 15–41)
Albumin: 4 g/dL (ref 3.5–5.0)
Alkaline Phosphatase: 87 U/L (ref 38–126)
Anion gap: 8 (ref 5–15)
BUN: 14 mg/dL (ref 6–20)
CHLORIDE: 102 mmol/L (ref 101–111)
CO2: 27 mmol/L (ref 22–32)
CREATININE: 1.05 mg/dL — AB (ref 0.44–1.00)
Calcium: 8.8 mg/dL — ABNORMAL LOW (ref 8.9–10.3)
GFR calc non Af Amer: 56 mL/min — ABNORMAL LOW (ref >60)
Glucose, Bld: 120 mg/dL — ABNORMAL HIGH (ref 65–99)
Potassium: 3.7 mmol/L (ref 3.5–5.1)
SODIUM: 137 mmol/L (ref 135–145)
Total Bilirubin: 0.5 mg/dL (ref 0.3–1.2)
Total Protein: 6.2 g/dL — ABNORMAL LOW (ref 6.5–8.1)

## 2017-04-08 MED ORDER — SODIUM CHLORIDE 0.9 % IV SOLN
1800.0000 mg | Freq: Once | INTRAVENOUS | Status: AC
  Administered 2017-04-08: 1800 mg via INTRAVENOUS
  Filled 2017-04-08: qty 26.3

## 2017-04-08 MED ORDER — HEPARIN SOD (PORK) LOCK FLUSH 100 UNIT/ML IV SOLN
500.0000 [IU] | Freq: Once | INTRAVENOUS | Status: AC | PRN
  Administered 2017-04-08: 500 [IU]
  Filled 2017-04-08: qty 5

## 2017-04-08 MED ORDER — SODIUM CHLORIDE 0.9 % IV SOLN
Freq: Once | INTRAVENOUS | Status: AC
  Administered 2017-04-08: 15:00:00 via INTRAVENOUS
  Filled 2017-04-08: qty 1000

## 2017-04-08 MED ORDER — PROCHLORPERAZINE MALEATE 10 MG PO TABS
10.0000 mg | ORAL_TABLET | Freq: Once | ORAL | Status: AC
  Administered 2017-04-08: 10 mg via ORAL
  Filled 2017-04-08: qty 1

## 2017-04-08 MED ORDER — PEGFILGRASTIM 6 MG/0.6ML ~~LOC~~ PSKT
6.0000 mg | PREFILLED_SYRINGE | Freq: Once | SUBCUTANEOUS | Status: AC
  Administered 2017-04-08: 6 mg via SUBCUTANEOUS
  Filled 2017-04-08: qty 0.6

## 2017-04-08 MED ORDER — DEXAMETHASONE 4 MG PO TABS: 40.0000 mg | ORAL_TABLET | Freq: Every day | ORAL | 3 refills | Status: DC

## 2017-04-08 NOTE — Assessment & Plan Note (Addendum)
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 Biopsy proven R ax LN]. Nov 2018- PET-shows recurrent disease in the right axilla/subpectoral region; also significant splenic uptake; multiple bone lesions.  # R-GDP s/p cycle #1 day-1 far; due for cycle #1 day- 8 gem today; proceed with day-8 today. Labs today reviewed;  acceptable for treatment today- except for- [see below] platelets- 46.   # We will plan repeating a scan after 2 cycles; likely stem cell transplant after 3 cycles.  Patient awaiting transplant evaluation at [facility] next week.  # Moderate thrombocytopenia- platelets- 46-likely secondary to underlying malignancy splenomegaly; chemotherapy.  Patient asymptomatic.  As discussed above-proceed with chemotherapy.  We will check labs again in 1 week; possible platelet transfusion at the time.   # Nausea-grade 2; from cisplatin.  Discussed multiple options including-continue dexamethasone 4 mg/day after the 4 days of Dex 40; discussed regarding Ativan as needed; also Zyprexa as needed.  Discussed the potential side effects.  #Given the non-GCB subtype/outpatient treatment preferred-we will plan R-GDP chemotherapy; every 21 days.    #Discussed regarding on pro-today.  Discussed the potential side effects-including myalgias; recommend Claritin once a day for 7 days.  #  Continue prophylactic acyclovir.    # 1 week/labs- possible platelets; follow up with me in 2 weeks-RGDP/labs.

## 2017-04-08 NOTE — Progress Notes (Signed)
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 CT- 1-2 CM LN subpectoral/medistinal/ retro-peritoneal/pelvic/ Right inguinal LN 1.6cm- Bx- DLBCL- ABC; myc-POS; FISH gene re-arragement-NEG. PET- MULTIPLE LN/ Bone involvement; R-CHOP x6- CR'# OCT 2017- PET scan- CR; DEC 22nd 2017- BMBx- "atypical large B cells- <1%  [[facility]; II opinion- Dr.Grover- review of path- not concerning for residual lymphoma]; recommend surviellance  # March 26th  2018PET- NED  # Lumbar puncture- difficult-spinal headache. S/p high dose MXT x4.  ------------------------------------------------------------------- # NOV 2018-recurrent diffuse large B-cell lymphoma [right axilla lymph node biopsy proven]; PET- multiple bone lesions; axillary adenopathy; splenomegaly uptake  # Nov 16th 2018- R-GDP     ---------------------------------------------------------- # June 2017-Elevated LFTs- valacylovir/diflucan; resolved.   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   # May 2017- EF- 55-65%   # port flush     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)    Diffuse large B-cell lymphoma of lymph nodes of multiple regions ([place])   05/25/2016 Initial  Diagnosis    Diffuse large B-cell lymphoma of lymph nodes of multiple regions [facility])      INTERVAL HISTORY:  [name] with above history of diffuse large B-cell lymphoma stage IV-relapsed is currently status post cycle #1; day 1 of R-GDP at this time.  Given the long duration of cisplatin and rituximab-the infusions were done on 2 different days. Patient tolerated chemotherapy/rituximab without any major infusion reactions.  However patient states to have significant nausea without any significant vomiting that lasted about 3-4 days post chemotherapy.  Interestingly, her leg cramping has since improved.  She continues to intermittent palpitations.  Denies any significant lumps or bumps. Denies any cough or shortness of breath or chest pain.  She is concerned about constipation.  No hearing difficulty.  REVIEW OF SYSTEMS:  A complete 10 point review of system is done which is negative except mentioned above/history of present illness.   PAST MEDICAL HISTORY :  Past Medical History:  Diagnosis Date  . Arthritis   . Blood dyscrasia   . Cancer ([place])   . Chest pain, unspecified   . Diverticulosis 07/06/15  . Dysrhythmia   . Esophagitis 07/06/15  . Fatigue   . Gastritis 07/06/15  . GERD (gastroesophageal reflux disease)   . 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 Leg cramps   . Lymphoma ([place]) 09/14/15   Monoclonal B cell lymphoma  . Night sweats   . Osteoporosis   . Overweight(278.02)    Obesity  . Palpitations   . Shortness of breath dyspnea   . Tachycardia   . Thrombocytopenia ([place])     PAST SURGICAL HISTORY :   Past Surgical History:  Procedure Laterality Date  . ABDOMINAL HYSTERECTOMY  2005  . BONE MARROW BIOPSY  09/14/15  .  CHOLECYSTECTOMY  1997  . COLONOSCOPY  07/05/2014  . ESOPHAGOGASTRODUODENOSCOPY  07/05/2014  . INGUINAL LYMPH NODE BIOPSY Right 10/05/2015   Procedure: INGUINAL LYMPH NODE BIOPSY;  Surgeon: Christene Lye, MD;   Location: [facility] ORS;  Service: General;  Laterality: Right;  . PERIPHERAL VASCULAR CATHETERIZATION N/A 09/27/2015   Procedure: Glori Luis Cath Insertion;  Surgeon: Algernon Huxley, MD;  Location: [facility];  Service: Cardiovascular;  Laterality: N/A;  . PORTACATH PLACEMENT Right     FAMILY HISTORY :   Family History  Problem Relation Age of Onset  . Heart disease Father        CABG x 4  . Multiple myeloma Father   . Hypertension Mother   . Pancreatic cancer Mother   . Ulcers Mother   . Asthma Mother   . Brain cancer Maternal Uncle   . Multiple myeloma Maternal Uncle   . Diabetes Neg Hx   . Breast cancer Neg Hx     SOCIAL HISTORY:   Social History   Tobacco Use  . Smoking status: Never Smoker  . Smokeless tobacco: Never Used  Substance Use Topics  . Alcohol use: No    Alcohol/week: 0.0 oz  . Drug use: No    ALLERGIES:  has No Known Allergies.  MEDICATIONS:  Current Outpatient Medications  Medication Sig Dispense Refill  . acyclovir (ZOVIRAX) 400 MG tablet Take 1 tablet (400 mg total) 2 (two) times daily by mouth. One pill a day [to prevent shingles] 60 tablet 3  . Alum Hydroxide-Mag Carbonate (GAVISCON PO) Take by mouth.    Marland Kitchen CALCIUM PO Take 600 mg 2 (two) times daily by mouth.     Javier Docker Oil 1000 MG CAPS Take by mouth.    . lidocaine-prilocaine (EMLA) cream Apply to affected area once 30 g 3  . ondansetron (ZOFRAN) 8 MG tablet Take 1 tablet (8 mg total) 2 (two) times daily as needed by mouth. Start on the 3rd day after cisplatin chemotherapy. 30 tablet 1  . prochlorperazine (COMPAZINE) 10 MG tablet Take 1 tablet (10 mg total) every 6 (six) hours as needed by mouth (Nausea or vomiting). 30 tablet 1  . ranitidine (ZANTAC) 150 MG tablet Take 150 mg by mouth 2 (two) times daily.    Marland Kitchen XIIDRA 5 % SOLN 1 drop.   4  . acetaminophen (TYLENOL 8 HOUR) 650 MG CR tablet Take 650 mg by mouth every 8 (eight) hours as needed for pain.    Marland Kitchen dexamethasone (DECADRON) 4 MG tablet Take  10 tablets (40 mg total) by mouth daily. Start the day after cisplatin chemotherapy for 4 days. 40 tablet 3  . lidocaine-prilocaine (EMLA) cream   2   No current facility-administered medications for this visit.    Facility-Administered Medications Ordered in Other Visits  Medication Dose Route Frequency Provider Last Rate Last Dose  . 0.9 %  sodium chloride infusion   Intravenous Continuous Evlyn Kanner, NP   Stopped at 10/14/15 1312  . methotrexate (50 mg/ml) 6.3 g in sodium chloride 0.9 % 1,000 mL injection   Intravenous Once Charlaine Dalton R, MD      . sodium chloride flush (NS) 0.9 % injection 10 mL  10 mL Intravenous PRN Cammie Sickle, MD   10 mL at 10/05/16 1400    PHYSICAL EXAMINATION: ECOG PERFORMANCE STATUS: 1 - Symptomatic but completely ambulatory  BP 130/80 (BP Location: Right Arm, Patient Position: Sitting)   Pulse 67   Temp 97.8  F (36.6 C) (Tympanic)   Resp 20   Ht 5' 2"  (1.575 m)   Wt 178 lb (80.7 kg)   BMI 32.56 kg/m   Filed Weights   04/08/17 1348  Weight: 178 lb (80.7 kg)    GENERAL: Well-nourished well-developed; Alert, no distress and comfortable.   Accompanied by her husband.  EYES: no pallor or icterus OROPHARYNX: no thrush; good dentition  NECK: supple, no masses felt LYMPH:  no palpable lymphadenopathy in the cervical, axillary or inguinal regions LUNGS: clear to auscultation and  No wheeze or crackles HEART/CVS: regular rate & rhythm and no murmurs; No lower extremity edema. ABDOMEN:abdomen soft, non-tender and normal bowel sounds; positive for splenomegaly. Musculoskeletal:no cyanosis of digits and no clubbing  PSYCH: alert & oriented x 3 with fluent speech NEURO: no focal motor/sensory deficits SKIN:  no rashes or significant lesions; MediPort in place. LABORATORY DATA:  I have reviewed the data as listed    Component Value Date/Time   NA 137 04/08/2017 1333   NA 139 06/29/2010 0749   K 3.7 04/08/2017 1333   CL 102  04/08/2017 1333   CO2 27 04/08/2017 1333   GLUCOSE 120 (H) 04/08/2017 1333   BUN 14 04/08/2017 1333   BUN 16 06/29/2010 0749   CREATININE 1.05 (H) 04/08/2017 1333   CALCIUM 8.8 (L) 04/08/2017 1333   PROT 6.2 (L) 04/08/2017 1333   ALBUMIN 4.0 04/08/2017 1333   AST 21 04/08/2017 1333   ALT 37 04/08/2017 1333   ALKPHOS 87 04/08/2017 1333   BILITOT 0.5 04/08/2017 1333   GFRNONAA 56 (L) 04/08/2017 1333   GFRAA >60 04/08/2017 1333    No results found for: SPEP, UPEP  Lab Results  Component Value Date   WBC 3.1 (L) 04/08/2017   NEUTROABS 2.1 04/08/2017   HGB 10.9 (L) 04/08/2017   HCT 31.7 (L) 04/08/2017   MCV 83.3 04/08/2017   PLT 46 (L) 04/08/2017      Chemistry      Component Value Date/Time   NA 137 04/08/2017 1333   NA 139 06/29/2010 0749   K 3.7 04/08/2017 1333   CL 102 04/08/2017 1333   CO2 27 04/08/2017 1333   BUN 14 04/08/2017 1333   BUN 16 06/29/2010 0749   CREATININE 1.05 (H) 04/08/2017 1333   GLU 93 06/29/2010 0749      Component Value Date/Time   CALCIUM 8.8 (L) 04/08/2017 1333   ALKPHOS 87 04/08/2017 1333   AST 21 04/08/2017 1333   ALT 37 04/08/2017 1333   BILITOT 0.5 04/08/2017 1333     IMPRESSION: Acute sigmoid diverticulitis affecting the distal sigmoid colon. No abscess.   Electronically Signed   By: Nelson Chimes M.D.   On: 02/15/2017 13:44  RADIOGRAPHIC STUDIES: I have personally reviewed the radiological images as listed and agreed with the findings in the report. No results found.   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 Biopsy proven R ax LN]. Nov 2018- PET-shows recurrent disease in the right axilla/subpectoral region; also significant splenic uptake; multiple bone lesions.  # R-GDP s/p cycle #1 day-1 far; due for cycle #1 day- 8 gem today; proceed with day-8 today. Labs today  reviewed;  acceptable for treatment today- except for- [see below] platelets- 46.   # We will plan repeating a scan after 2 cycles; likely stem cell transplant  after 3 cycles.  Patient awaiting transplant evaluation at [facility] next week.  # Moderate thrombocytopenia- platelets- 46-likely secondary to underlying malignancy splenomegaly; chemotherapy.  Patient asymptomatic.  As discussed above-proceed with chemotherapy.  We will check labs again in 1 week; possible platelet transfusion at the time.   # Nausea-grade 2; from cisplatin.  Discussed multiple options including-continue dexamethasone 4 mg/day after the 4 days of Dex 40; discussed regarding Ativan as needed; also Zyprexa as needed.  Discussed the potential side effects.  #Given the non-GCB subtype/outpatient treatment preferred-we will plan R-GDP chemotherapy; every 21 days.    #Discussed regarding on pro-today.  Discussed the potential side effects-including myalgias; recommend Claritin once a day for 7 days.  #  Continue prophylactic acyclovir.    # 1 week/labs- possible platelets; follow up with me in 2 weeks-RGDP/labs.   Orders Placed This Encounter  Procedures  . CBC with Differential    Standing Status:   Future    Standing Expiration Date:   04/08/2018  . CBC with Differential    Standing Status:   Future    Standing Expiration Date:   04/08/2018  . Comprehensive metabolic panel    Standing Status:   Future    Standing Expiration Date:   04/08/2018  . Lactate dehydrogenase    Standing Status:   Future    Standing Expiration Date:   04/08/2018  . Hold Tube- Blood Bank    Standing Status:   Future    Standing Expiration Date:   04/08/2018     Cammie Sickle, MD 04/08/2017 11:29 PM

## 2017-04-08 NOTE — Progress Notes (Signed)
Plt =46.  MD ok to proceed with treatment

## 2017-04-08 NOTE — Progress Notes (Signed)
Patient voices concerns about nausea s/p cisplatin infusion. She altered zofran and compazine every 6-8 hrs and "the nausea pills did not relieve my nausea." pt requesting a different antiemetics. Discussed wtith patient and her husband to take zofran every 8hrs for the first 3 days s/p cisplatin infusion, then she could potentially alterate every 6-8 hrs with compazine.  Discussed with patient that there are other antimetic drug choices and that I would inform Dr. Rogue Bussing of her concerns regarding antiemetics. Pt voices concerns that the antiemetics caused her to experience constipation in the past "where I had to be disimpacted. I just don't want to go through that again."

## 2017-04-11 ENCOUNTER — Telehealth: Payer: Self-pay | Admitting: *Deleted

## 2017-04-11 NOTE — Telephone Encounter (Signed)
Returned call to [place] and gave her phone numbers she needs to get path and radiology

## 2017-04-11 NOTE — Telephone Encounter (Signed)
IF they are looking for bone marrow biopsy results/slides, these will would need to be obtained from [facility] ([phone]).  If it is the most recent pathology from the axillary lymph node biopsy, then the biopsy slides would need to be obtained from [facility]- (phone:[phone]).

## 2017-04-11 NOTE — Telephone Encounter (Signed)
Ct scan results/Images are in care everywhere. They can contact radiology at [phone] to obtain the images to be fedx.

## 2017-04-11 NOTE — Telephone Encounter (Signed)
Mackenzie with [facility] BMT unit called to ask for help getting path slides and imaging discs. She states she has called several time to the hospital and keeps getting transferred from place to place and she needs these to be able to see patient. Please return her call [phone]

## 2017-04-11 NOTE — Telephone Encounter (Signed)
What about the imaging?

## 2017-04-15 ENCOUNTER — Inpatient Hospital Stay: Payer: BLUE CROSS/BLUE SHIELD

## 2017-04-15 ENCOUNTER — Inpatient Hospital Stay: Payer: BLUE CROSS/BLUE SHIELD | Attending: Internal Medicine

## 2017-04-15 DIAGNOSIS — Z86718 Personal history of other venous thrombosis and embolism: Secondary | ICD-10-CM | POA: Insufficient documentation

## 2017-04-15 DIAGNOSIS — C7951 Secondary malignant neoplasm of bone: Secondary | ICD-10-CM | POA: Insufficient documentation

## 2017-04-15 DIAGNOSIS — R309 Painful micturition, unspecified: Secondary | ICD-10-CM | POA: Insufficient documentation

## 2017-04-15 DIAGNOSIS — Z5111 Encounter for antineoplastic chemotherapy: Secondary | ICD-10-CM | POA: Insufficient documentation

## 2017-04-15 DIAGNOSIS — M810 Age-related osteoporosis without current pathological fracture: Secondary | ICD-10-CM | POA: Diagnosis not present

## 2017-04-15 DIAGNOSIS — M1990 Unspecified osteoarthritis, unspecified site: Secondary | ICD-10-CM | POA: Diagnosis not present

## 2017-04-15 DIAGNOSIS — R110 Nausea: Secondary | ICD-10-CM | POA: Insufficient documentation

## 2017-04-15 DIAGNOSIS — E669 Obesity, unspecified: Secondary | ICD-10-CM | POA: Diagnosis not present

## 2017-04-15 DIAGNOSIS — K5790 Diverticulosis of intestine, part unspecified, without perforation or abscess without bleeding: Secondary | ICD-10-CM | POA: Insufficient documentation

## 2017-04-15 DIAGNOSIS — R1030 Lower abdominal pain, unspecified: Secondary | ICD-10-CM | POA: Insufficient documentation

## 2017-04-15 DIAGNOSIS — Z7901 Long term (current) use of anticoagulants: Secondary | ICD-10-CM | POA: Insufficient documentation

## 2017-04-15 DIAGNOSIS — C8338 Diffuse large B-cell lymphoma, lymph nodes of multiple sites: Secondary | ICD-10-CM | POA: Diagnosis present

## 2017-04-15 DIAGNOSIS — K219 Gastro-esophageal reflux disease without esophagitis: Secondary | ICD-10-CM | POA: Insufficient documentation

## 2017-04-15 DIAGNOSIS — Z79899 Other long term (current) drug therapy: Secondary | ICD-10-CM | POA: Insufficient documentation

## 2017-04-15 DIAGNOSIS — Z95828 Presence of other vascular implants and grafts: Secondary | ICD-10-CM

## 2017-04-15 LAB — CBC WITH DIFFERENTIAL/PLATELET
BASOS ABS: 0.1 10*3/uL (ref 0–0.1)
Basophils Relative: 0 %
EOS ABS: 0 10*3/uL (ref 0–0.7)
EOS PCT: 0 %
HCT: 29.8 % — ABNORMAL LOW (ref 35.0–47.0)
HEMOGLOBIN: 10.1 g/dL — AB (ref 12.0–16.0)
LYMPHS ABS: 1.5 10*3/uL (ref 1.0–3.6)
LYMPHS PCT: 7 %
MCH: 28.6 pg (ref 26.0–34.0)
MCHC: 34 g/dL (ref 32.0–36.0)
MCV: 84.2 fL (ref 80.0–100.0)
Monocytes Absolute: 1.5 10*3/uL — ABNORMAL HIGH (ref 0.2–0.9)
Monocytes Relative: 7 %
NEUTROS PCT: 86 %
Neutro Abs: 19 10*3/uL — ABNORMAL HIGH (ref 1.4–6.5)
PLATELETS: 64 10*3/uL — AB (ref 150–440)
RBC: 3.54 MIL/uL — ABNORMAL LOW (ref 3.80–5.20)
RDW: 13.5 % (ref 11.5–14.5)
WBC: 22 10*3/uL — AB (ref 3.6–11.0)

## 2017-04-15 MED ORDER — HEPARIN SOD (PORK) LOCK FLUSH 100 UNIT/ML IV SOLN
INTRAVENOUS | Status: AC
  Filled 2017-04-15: qty 5

## 2017-04-15 MED ORDER — HEPARIN SOD (PORK) LOCK FLUSH 100 UNIT/ML IV SOLN
500.0000 [IU] | Freq: Once | INTRAVENOUS | Status: AC
  Administered 2017-04-15: 500 [IU] via INTRAVENOUS

## 2017-04-16 LAB — SAMPLE TO BLOOD BANK

## 2017-04-22 ENCOUNTER — Inpatient Hospital Stay: Payer: BLUE CROSS/BLUE SHIELD | Admitting: Internal Medicine

## 2017-04-22 ENCOUNTER — Inpatient Hospital Stay: Payer: BLUE CROSS/BLUE SHIELD

## 2017-04-23 ENCOUNTER — Inpatient Hospital Stay: Payer: BLUE CROSS/BLUE SHIELD

## 2017-04-24 ENCOUNTER — Inpatient Hospital Stay (HOSPITAL_BASED_OUTPATIENT_CLINIC_OR_DEPARTMENT_OTHER): Payer: BLUE CROSS/BLUE SHIELD | Admitting: Internal Medicine

## 2017-04-24 ENCOUNTER — Encounter: Payer: Self-pay | Admitting: Internal Medicine

## 2017-04-24 ENCOUNTER — Inpatient Hospital Stay: Payer: BLUE CROSS/BLUE SHIELD

## 2017-04-24 VITALS — BP 128/82 | HR 71 | Temp 96.4°F | Resp 16 | Wt 174.0 lb

## 2017-04-24 DIAGNOSIS — Z79899 Other long term (current) drug therapy: Secondary | ICD-10-CM

## 2017-04-24 DIAGNOSIS — C8338 Diffuse large B-cell lymphoma, lymph nodes of multiple sites: Secondary | ICD-10-CM

## 2017-04-24 DIAGNOSIS — C7951 Secondary malignant neoplasm of bone: Secondary | ICD-10-CM | POA: Diagnosis not present

## 2017-04-24 DIAGNOSIS — C8583 Other specified types of non-Hodgkin lymphoma, intra-abdominal lymph nodes: Secondary | ICD-10-CM

## 2017-04-24 DIAGNOSIS — R110 Nausea: Secondary | ICD-10-CM | POA: Diagnosis not present

## 2017-04-24 LAB — CBC WITH DIFFERENTIAL/PLATELET
BASOS ABS: 0 10*3/uL (ref 0–0.1)
Basophils Relative: 1 %
EOS ABS: 0 10*3/uL (ref 0–0.7)
EOS PCT: 1 %
HCT: 32.8 % — ABNORMAL LOW (ref 35.0–47.0)
HEMOGLOBIN: 11.2 g/dL — AB (ref 12.0–16.0)
LYMPHS ABS: 1 10*3/uL (ref 1.0–3.6)
Lymphocytes Relative: 13 %
MCH: 29.5 pg (ref 26.0–34.0)
MCHC: 34.1 g/dL (ref 32.0–36.0)
MCV: 86.4 fL (ref 80.0–100.0)
Monocytes Absolute: 1 10*3/uL — ABNORMAL HIGH (ref 0.2–0.9)
Monocytes Relative: 13 %
NEUTROS PCT: 74 %
Neutro Abs: 6 10*3/uL (ref 1.4–6.5)
Platelets: 300 10*3/uL (ref 150–440)
RBC: 3.8 MIL/uL (ref 3.80–5.20)
RDW: 13.9 % (ref 11.5–14.5)
WBC: 8.2 10*3/uL (ref 3.6–11.0)

## 2017-04-24 LAB — COMPREHENSIVE METABOLIC PANEL
ALT: 12 U/L — AB (ref 14–54)
AST: 21 U/L (ref 15–41)
Albumin: 4.1 g/dL (ref 3.5–5.0)
Alkaline Phosphatase: 99 U/L (ref 38–126)
Anion gap: 7 (ref 5–15)
BILIRUBIN TOTAL: 0.6 mg/dL (ref 0.3–1.2)
BUN: 9 mg/dL (ref 6–20)
CHLORIDE: 105 mmol/L (ref 101–111)
CO2: 25 mmol/L (ref 22–32)
CREATININE: 0.96 mg/dL (ref 0.44–1.00)
Calcium: 8.9 mg/dL (ref 8.9–10.3)
Glucose, Bld: 95 mg/dL (ref 65–99)
POTASSIUM: 3.8 mmol/L (ref 3.5–5.1)
Sodium: 137 mmol/L (ref 135–145)
TOTAL PROTEIN: 6.4 g/dL — AB (ref 6.5–8.1)

## 2017-04-24 LAB — LACTATE DEHYDROGENASE: LDH: 224 U/L — AB (ref 98–192)

## 2017-04-24 MED ORDER — HEPARIN SOD (PORK) LOCK FLUSH 100 UNIT/ML IV SOLN
INTRAVENOUS | Status: AC
  Filled 2017-04-24: qty 5

## 2017-04-24 MED ORDER — SODIUM CHLORIDE 0.9 % IV SOLN
Freq: Once | INTRAVENOUS | Status: AC
  Administered 2017-04-24: 10:00:00 via INTRAVENOUS
  Filled 2017-04-24: qty 1000

## 2017-04-24 MED ORDER — FOSAPREPITANT DIMEGLUMINE INJECTION 150 MG
Freq: Once | INTRAVENOUS | Status: AC
  Administered 2017-04-24: 12:00:00 via INTRAVENOUS
  Filled 2017-04-24: qty 5

## 2017-04-24 MED ORDER — POTASSIUM CHLORIDE 2 MEQ/ML IV SOLN
Freq: Once | INTRAVENOUS | Status: AC
  Administered 2017-04-24: 10:00:00 via INTRAVENOUS
  Filled 2017-04-24: qty 1000

## 2017-04-24 MED ORDER — LORAZEPAM 0.5 MG PO TABS: 0.5000 mg | ORAL_TABLET | Freq: Three times a day (TID) | ORAL | 0 refills | Status: DC

## 2017-04-24 MED ORDER — PROMETHAZINE HCL 25 MG PO TABS: 25.0000 mg | ORAL_TABLET | Freq: Three times a day (TID) | ORAL | 0 refills | Status: DC | PRN

## 2017-04-24 MED ORDER — HEPARIN SOD (PORK) LOCK FLUSH 100 UNIT/ML IV SOLN
500.0000 [IU] | Freq: Once | INTRAVENOUS | Status: AC | PRN
  Administered 2017-04-24: 500 [IU]

## 2017-04-24 MED ORDER — PALONOSETRON HCL INJECTION 0.25 MG/5ML
0.2500 mg | Freq: Once | INTRAVENOUS | Status: AC
  Administered 2017-04-24: 0.25 mg via INTRAVENOUS
  Filled 2017-04-24: qty 5

## 2017-04-24 MED ORDER — SODIUM CHLORIDE 0.9 % IV SOLN
1800.0000 mg | Freq: Once | INTRAVENOUS | Status: AC
  Administered 2017-04-24: 1800 mg via INTRAVENOUS
  Filled 2017-04-24: qty 26.3

## 2017-04-24 MED ORDER — SODIUM CHLORIDE 0.9 % IV SOLN
375.0000 mg/m2 | Freq: Once | INTRAVENOUS | Status: AC
  Administered 2017-04-24: 700 mg via INTRAVENOUS
  Filled 2017-04-24: qty 50

## 2017-04-24 MED ORDER — ACETAMINOPHEN 325 MG PO TABS
650.0000 mg | ORAL_TABLET | Freq: Once | ORAL | Status: AC
  Administered 2017-04-24: 650 mg via ORAL
  Filled 2017-04-24: qty 2

## 2017-04-24 MED ORDER — SODIUM CHLORIDE 0.9 % IV SOLN: 375.0000 mg/m2 | Freq: Once | INTRAVENOUS | Status: DC

## 2017-04-24 MED ORDER — DIPHENHYDRAMINE HCL 25 MG PO CAPS
50.0000 mg | ORAL_CAPSULE | Freq: Once | ORAL | Status: AC
  Administered 2017-04-24: 50 mg via ORAL
  Filled 2017-04-24: qty 2

## 2017-04-24 MED ORDER — SODIUM CHLORIDE 0.9 % IV SOLN: 2000.0000 mg | Freq: Once | INTRAVENOUS | Status: DC

## 2017-04-24 MED ORDER — SODIUM CHLORIDE 0.9 % IV SOLN
75.0000 mg/m2 | Freq: Once | INTRAVENOUS | Status: AC
  Administered 2017-04-24: 140 mg via INTRAVENOUS
  Filled 2017-04-24: qty 100

## 2017-04-24 NOTE — Progress Notes (Signed)
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 CT- 1-2 CM LN subpectoral/medistinal/ retro-peritoneal/pelvic/ Right inguinal LN 1.6cm- Bx- DLBCL- ABC; myc-POS; FISH gene re-arragement-NEG. PET- MULTIPLE LN/ Bone involvement; R-CHOP x6- CR'# OCT 2017- PET scan- CR; DEC 22nd 2017- BMBx- "atypical large B cells- <1%  [[facility]; II opinion- Dr.Grover- review of path- not concerning for residual lymphoma]; recommend surviellance  # March 26th  2018PET- NED  # Lumbar puncture- difficult-spinal headache. S/p high dose MXT x4.  ------------------------------------------------------------------- # NOV 2018-recurrent diffuse large B-cell lymphoma [right axilla lymph node biopsy proven]; PET- multiple bone lesions; axillary adenopathy; splenomegaly uptake  # Nov 16th 2018- R-GDP     ---------------------------------------------------------- # June 2017-Elevated LFTs- valacylovir/diflucan; resolved.   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  In the interim patient has been evaluated-at [facility] for transplant evaluation.   Denies any significant lumps or bumps. Denies any cough or shortness of breath or chest pain.  No tingling or numbness.  Appetite is fair.  She is concerned about mouth sores.  She is also significantly concerned about nausea vomiting.  Interested in more prophylactic medications.  REVIEW OF SYSTEMS:  A complete 10 point review of system is done which is negative except mentioned above/history of present illness.   PAST MEDICAL HISTORY :  Past Medical History:  Diagnosis Date  . Arthritis   . Blood dyscrasia   . Cancer ([place])   . Chest pain, unspecified   . Diverticulosis 07/06/15  . Dysrhythmia   . Esophagitis 07/06/15  . Fatigue   . Gastritis 07/06/15  . GERD (gastroesophageal reflux disease)   . 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 Leg cramps   . Lymphoma ([place]) 09/14/15   Monoclonal B cell lymphoma  . Night sweats   . Osteoporosis   . Overweight(278.02)    Obesity  . Palpitations   . Shortness of breath dyspnea   . Tachycardia   . Thrombocytopenia ([place])     PAST SURGICAL HISTORY :   Past Surgical History:  Procedure Laterality Date  . ABDOMINAL HYSTERECTOMY  2005  . BONE MARROW BIOPSY  09/14/15  . CHOLECYSTECTOMY  1997  . COLONOSCOPY  07/05/2014  . ESOPHAGOGASTRODUODENOSCOPY  07/05/2014  . INGUINAL LYMPH NODE BIOPSY Right 10/05/2015  Procedure: INGUINAL LYMPH NODE BIOPSY;  Surgeon: Christene Lye, MD;  Location: [facility] ORS;  Service: General;  Laterality: Right;  . PERIPHERAL VASCULAR CATHETERIZATION N/A 09/27/2015   Procedure: Glori Luis Cath Insertion;  Surgeon: Algernon Huxley, MD;  Location: [place] CV  LAB;  Service: Cardiovascular;  Laterality: N/A;  . PORTACATH PLACEMENT Right     FAMILY HISTORY :   Family History  Problem Relation Age of Onset  . Heart disease Father        CABG x 4  . Multiple myeloma Father   . Hypertension Mother   . Pancreatic cancer Mother   . Ulcers Mother   . Asthma Mother   . Brain cancer Maternal Uncle   . Multiple myeloma Maternal Uncle   . Diabetes Neg Hx   . Breast cancer Neg Hx     SOCIAL HISTORY:   Social History   Tobacco Use  . Smoking status: Never Smoker  . Smokeless tobacco: Never Used  Substance Use Topics  . Alcohol use: No    Alcohol/week: 0.0 oz  . Drug use: No    ALLERGIES:  has No Known Allergies.  MEDICATIONS:  Current Outpatient Medications  Medication Sig Dispense Refill  . acetaminophen (TYLENOL 8 HOUR) 650 MG CR tablet Take 650 mg by mouth every 8 (eight) hours as needed for pain.    Marland Kitchen acyclovir (ZOVIRAX) 400 MG tablet Take 1 tablet (400 mg total) 2 (two) times daily by mouth. One pill a day [to prevent shingles] 60 tablet 3  . Alum Hydroxide-Mag Carbonate (GAVISCON PO) Take by mouth.    Marland Kitchen CALCIUM PO Take 600 mg 2 (two) times daily by mouth.     . dexamethasone (DECADRON) 4 MG tablet Take 10 tablets (40 mg total) by mouth daily. Start the day after cisplatin chemotherapy for 4 days. 40 tablet 3  . Krill Oil 1000 MG CAPS Take by mouth.    . lidocaine-prilocaine (EMLA) cream   2  . lidocaine-prilocaine (EMLA) cream Apply to affected area once 30 g 3  . ondansetron (ZOFRAN) 8 MG tablet Take 1 tablet (8 mg total) 2 (two) times daily as needed by mouth. Start on the 3rd day after cisplatin chemotherapy. 30 tablet 1  . prochlorperazine (COMPAZINE) 10 MG tablet Take 1 tablet (10 mg total) every 6 (six) hours as needed by mouth (Nausea or vomiting). 30 tablet 1  . ranitidine (ZANTAC) 150 MG tablet Take 150 mg by mouth 2 (two) times daily.    Marland Kitchen XIIDRA 5 % SOLN 1 drop.   4  . LORazepam (ATIVAN) 0.5 MG tablet Take 1 tablet (0.5  mg total) by mouth every 8 (eight) hours. 30 tablet 0  . promethazine (PHENERGAN) 25 MG tablet Take 1 tablet (25 mg total) by mouth every 8 (eight) hours as needed for nausea or vomiting. 30 tablet 0   No current facility-administered medications for this visit.    Facility-Administered Medications Ordered in Other Visits  Medication Dose Route Frequency Provider Last Rate Last Dose  . 0.9 %  sodium chloride infusion   Intravenous Continuous Evlyn Kanner, NP   Stopped at 10/14/15 1312  . methotrexate (50 mg/ml) 6.3 g in sodium chloride 0.9 % 1,000 mL injection   Intravenous Once Charlaine Dalton R, MD      . sodium chloride flush (NS) 0.9 % injection 10 mL  10 mL Intravenous PRN Cammie Sickle, MD   10 mL at 10/05/16 1400    PHYSICAL EXAMINATION:  ECOG PERFORMANCE STATUS: 1 - Symptomatic but completely ambulatory  BP 128/82 (BP Location: Left Arm, Patient Position: Sitting)   Pulse 71   Temp (!) 96.4 F (35.8 C) (Tympanic)   Resp 16   Wt 174 lb (78.9 kg)   BMI 31.83 kg/m   Filed Weights   04/24/17 0829  Weight: 174 lb (78.9 kg)    GENERAL: Well-nourished well-developed; Alert, no distress and comfortable.   Accompanied by her husband.  EYES: no pallor or icterus OROPHARYNX: no thrush; good dentition  NECK: supple, no masses felt LYMPH:  no palpable lymphadenopathy in the cervical, axillary or inguinal regions LUNGS: clear to auscultation and  No wheeze or crackles HEART/CVS: regular rate & rhythm and no murmurs; No lower extremity edema. ABDOMEN:abdomen soft, non-tender and normal bowel sounds; positive for splenomegaly. Musculoskeletal:no cyanosis of digits and no clubbing  PSYCH: alert & oriented x 3 with fluent speech NEURO: no focal motor/sensory deficits SKIN:  no rashes or significant lesions; MediPort in place. LABORATORY DATA:  I have reviewed the data as listed    Component Value Date/Time   NA 137 04/24/2017 0801   NA 139 06/29/2010 0749   K 3.8  04/24/2017 0801   CL 105 04/24/2017 0801   CO2 25 04/24/2017 0801   GLUCOSE 95 04/24/2017 0801   BUN 9 04/24/2017 0801   BUN 16 06/29/2010 0749   CREATININE 0.96 04/24/2017 0801   CALCIUM 8.9 04/24/2017 0801   PROT 6.4 (L) 04/24/2017 0801   ALBUMIN 4.1 04/24/2017 0801   AST 21 04/24/2017 0801   ALT 12 (L) 04/24/2017 0801   ALKPHOS 99 04/24/2017 0801   BILITOT 0.6 04/24/2017 0801   GFRNONAA >60 04/24/2017 0801   GFRAA >60 04/24/2017 0801    No results found for: SPEP, UPEP  Lab Results  Component Value Date   WBC 8.2 04/24/2017   NEUTROABS 6.0 04/24/2017   HGB 11.2 (L) 04/24/2017   HCT 32.8 (L) 04/24/2017   MCV 86.4 04/24/2017   PLT 300 04/24/2017      Chemistry      Component Value Date/Time   NA 137 04/24/2017 0801   NA 139 06/29/2010 0749   K 3.8 04/24/2017 0801   CL 105 04/24/2017 0801   CO2 25 04/24/2017 0801   BUN 9 04/24/2017 0801   BUN 16 06/29/2010 0749   CREATININE 0.96 04/24/2017 0801   GLU 93 06/29/2010 0749      Component Value Date/Time   CALCIUM 8.9 04/24/2017 0801   ALKPHOS 99 04/24/2017 0801   AST 21 04/24/2017 0801   ALT 12 (L) 04/24/2017 0801   BILITOT 0.6 04/24/2017 0801     IMPRESSION: Acute sigmoid diverticulitis affecting the distal sigmoid colon. No abscess.   Electronically Signed   By: Nelson Chimes M.D.   On: 02/15/2017 13:44  RADIOGRAPHIC STUDIES: I have personally reviewed the radiological images as listed and agreed with the findings in the report. No results found.   ASSESSMENT & PLAN:  Diffuse large B-cell lymphoma of lymph nodes of multiple regions ([place]) # RELAPSED Diffuse large B-cell lymphoma ABC subtype/ leukemic cells noted in the blood [initial may 2017]. Biopsy proven R ax LN]. Nov 2018- PET-shows recurrent disease in the right axilla/subpectoral region; also significant splenic uptake; multiple bone lesions.  #Status post cycle #1 of R-GDP; no major tolerability issues.  Improvement of the platelet count/LDH  noted-suggestive of clinical response.   # proceed with R-GDP s/p cycle #2 day-1 Labs today reviewed;  acceptable for treatment today. Plan PET for 2nd cycle.    # Nausea-grade 2; from cisplatin.  Discussed multiple options including-continue dexamethasone 4 mg/day after the 4 days of Dex 40; discussed regarding Ativan/ phenergan prn- scripts given.   #  Continue prophylactic acyclovir.    # weekly cbc/cmp/ldh- 1 week-gem; jan 2nd R-GDP; PET few days prior.   #I have left a message for the transplant coordinator at [phone] [facility]]  Orders Placed This Encounter  Procedures  . NM PET Image Restag (PS) Skull Base To Thigh    Standing Status:   Future    Standing Expiration Date:   04/24/2018    Scheduling Instructions:     around 27-28th of dec 2018    Order Specific Question:   If indicated for the ordered procedure, I authorize the administration of a radiopharmaceutical per Radiology protocol    Answer:   Yes    Order Specific Question:   Preferred imaging location?    Answer:   [facility]    Order Specific Question:   Radiology Contrast Protocol - do NOT remove file path    Answer:   [email or web address]     Cammie Sickle, MD 04/26/2017 8:29 AM

## 2017-04-24 NOTE — Assessment & Plan Note (Addendum)
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 Biopsy proven R ax LN]. Nov 2018- PET-shows recurrent disease in the right axilla/subpectoral region; also significant splenic uptake; multiple bone lesions.  #Status post cycle #1 of R-GDP; no major tolerability issues.  Improvement of the platelet count/LDH noted-suggestive of clinical response.   # proceed with R-GDP s/p cycle #2 day-1 Labs today reviewed;  acceptable for treatment today. Plan PET after the 2nd cycle.    # Nausea-grade 2; from cisplatin.  Discussed multiple options including-continue dexamethasone 4 mg/day after the 4 days of Dex 40; discussed regarding Ativan/ phenergan prn- scripts given.   #  Continue prophylactic acyclovir.    # weekly cbc/cmp/ldh- 1 week-gem; jan 2nd R-GDP; PET few days prior.   #I have left a message for the transplant coordinator at [phone] [facility] [place]]

## 2017-04-25 ENCOUNTER — Inpatient Hospital Stay: Payer: BLUE CROSS/BLUE SHIELD

## 2017-05-01 ENCOUNTER — Inpatient Hospital Stay: Payer: BLUE CROSS/BLUE SHIELD

## 2017-05-01 VITALS — BP 104/65 | HR 61 | Temp 98.1°F | Resp 18 | Wt 174.2 lb

## 2017-05-01 DIAGNOSIS — C8583 Other specified types of non-Hodgkin lymphoma, intra-abdominal lymph nodes: Secondary | ICD-10-CM

## 2017-05-01 DIAGNOSIS — C8338 Diffuse large B-cell lymphoma, lymph nodes of multiple sites: Secondary | ICD-10-CM

## 2017-05-01 LAB — COMPREHENSIVE METABOLIC PANEL
ALBUMIN: 4.1 g/dL (ref 3.5–5.0)
ALT: 26 U/L (ref 14–54)
ANION GAP: 9 (ref 5–15)
AST: 22 U/L (ref 15–41)
Alkaline Phosphatase: 74 U/L (ref 38–126)
BILIRUBIN TOTAL: 0.7 mg/dL (ref 0.3–1.2)
BUN: 20 mg/dL (ref 6–20)
CHLORIDE: 97 mmol/L — AB (ref 101–111)
CO2: 26 mmol/L (ref 22–32)
Calcium: 9 mg/dL (ref 8.9–10.3)
Creatinine, Ser: 0.95 mg/dL (ref 0.44–1.00)
GFR calc Af Amer: >60 mL/min (ref >60)
GFR calc non Af Amer: >60 mL/min (ref >60)
GLUCOSE: 193 mg/dL — AB (ref 65–99)
POTASSIUM: 3.8 mmol/L (ref 3.5–5.1)
SODIUM: 132 mmol/L — AB (ref 135–145)
TOTAL PROTEIN: 6.5 g/dL (ref 6.5–8.1)

## 2017-05-01 LAB — CBC WITH DIFFERENTIAL/PLATELET
Basophils Absolute: 0 10*3/uL (ref 0–0.1)
Basophils Relative: 0 %
EOS PCT: 0 %
Eosinophils Absolute: 0 10*3/uL (ref 0–0.7)
HEMATOCRIT: 34.1 % — AB (ref 35.0–47.0)
Hemoglobin: 11.7 g/dL — ABNORMAL LOW (ref 12.0–16.0)
LYMPHS ABS: 0.7 10*3/uL — AB (ref 1.0–3.6)
LYMPHS PCT: 12 %
MCH: 29.5 pg (ref 26.0–34.0)
MCHC: 34.4 g/dL (ref 32.0–36.0)
MCV: 85.8 fL (ref 80.0–100.0)
MONO ABS: 0.3 10*3/uL (ref 0.2–0.9)
MONOS PCT: 5 %
NEUTROS ABS: 5.2 10*3/uL (ref 1.4–6.5)
Neutrophils Relative %: 83 %
PLATELETS: 162 10*3/uL (ref 150–440)
RBC: 3.97 MIL/uL (ref 3.80–5.20)
RDW: 16.2 % — AB (ref 11.5–14.5)
WBC: 6.2 10*3/uL (ref 3.6–11.0)

## 2017-05-01 LAB — LACTATE DEHYDROGENASE: LDH: 146 U/L (ref 98–192)

## 2017-05-01 MED ORDER — HEPARIN SOD (PORK) LOCK FLUSH 100 UNIT/ML IV SOLN
500.0000 [IU] | Freq: Once | INTRAVENOUS | Status: AC | PRN
  Administered 2017-05-01: 500 [IU]
  Filled 2017-05-01: qty 5

## 2017-05-01 MED ORDER — GEMCITABINE HCL CHEMO INJECTION 1 GM/26.3ML
1800.0000 mg | Freq: Once | INTRAVENOUS | Status: AC
  Administered 2017-05-01: 1800 mg via INTRAVENOUS
  Filled 2017-05-01: qty 26.3

## 2017-05-01 MED ORDER — SODIUM CHLORIDE 0.9 % IV SOLN: 1800.0000 mg | Freq: Once | INTRAVENOUS | Status: DC

## 2017-05-01 MED ORDER — PEGFILGRASTIM 6 MG/0.6ML ~~LOC~~ PSKT
6.0000 mg | PREFILLED_SYRINGE | Freq: Once | SUBCUTANEOUS | Status: AC
  Administered 2017-05-01: 6 mg via SUBCUTANEOUS
  Filled 2017-05-01: qty 0.6

## 2017-05-01 MED ORDER — SODIUM CHLORIDE 0.9 % IV SOLN
Freq: Once | INTRAVENOUS | Status: AC
  Administered 2017-05-01: 14:00:00 via INTRAVENOUS
  Filled 2017-05-01: qty 1000

## 2017-05-01 MED ORDER — PROCHLORPERAZINE MALEATE 10 MG PO TABS
10.0000 mg | ORAL_TABLET | Freq: Once | ORAL | Status: AC
  Administered 2017-05-01: 10 mg via ORAL
  Filled 2017-05-01: qty 1

## 2017-05-02 ENCOUNTER — Ambulatory Visit: Payer: BLUE CROSS/BLUE SHIELD

## 2017-05-03 ENCOUNTER — Ambulatory Visit: Payer: BLUE CROSS/BLUE SHIELD | Admitting: Internal Medicine

## 2017-05-03 ENCOUNTER — Other Ambulatory Visit: Payer: BLUE CROSS/BLUE SHIELD

## 2017-05-08 ENCOUNTER — Telehealth: Payer: Self-pay | Admitting: Internal Medicine

## 2017-05-08 ENCOUNTER — Telehealth: Payer: Self-pay | Admitting: *Deleted

## 2017-05-08 ENCOUNTER — Inpatient Hospital Stay: Payer: BLUE CROSS/BLUE SHIELD

## 2017-05-08 DIAGNOSIS — C8583 Other specified types of non-Hodgkin lymphoma, intra-abdominal lymph nodes: Secondary | ICD-10-CM

## 2017-05-08 DIAGNOSIS — R300 Dysuria: Secondary | ICD-10-CM

## 2017-05-08 DIAGNOSIS — C8338 Diffuse large B-cell lymphoma, lymph nodes of multiple sites: Secondary | ICD-10-CM | POA: Diagnosis not present

## 2017-05-08 LAB — URINALYSIS, COMPLETE (UACMP) WITH MICROSCOPIC
Bilirubin Urine: NEGATIVE
Glucose, UA: NEGATIVE mg/dL
Hgb urine dipstick: NEGATIVE
KETONES UR: NEGATIVE mg/dL
Leukocytes, UA: NEGATIVE
Nitrite: NEGATIVE
PROTEIN: NEGATIVE mg/dL
Specific Gravity, Urine: 1.015 (ref 1.005–1.030)
pH: 5 (ref 5.0–8.0)

## 2017-05-08 LAB — APTT: aPTT: 24 seconds (ref 24–36)

## 2017-05-08 LAB — PROTIME-INR
INR: 0.87
Prothrombin Time: 11.7 seconds (ref 11.4–15.2)

## 2017-05-08 LAB — C-REACTIVE PROTEIN: CRP: <0.8 mg/dL (ref <1.0)

## 2017-05-08 MED ORDER — PHENAZOPYRIDINE HCL 100 MG PO TABS: 100.0000 mg | ORAL_TABLET | Freq: Three times a day (TID) | ORAL | 0 refills | Status: DC

## 2017-05-08 NOTE — Telephone Encounter (Signed)
Brenda-please inform patient that urine negative for infection.  Recommend Pyridium if having any burning pain.   Prescription sent to pharmacy; please make patient aware that urine could turn orange while on pills; continue increased fluid intake as she is on cisplatin.

## 2017-05-08 NOTE — Telephone Encounter (Signed)
Has 2820 lab appointment and thinks she has UTI and wants urine checked. Please advise

## 2017-05-08 NOTE — Telephone Encounter (Signed)
Stacy Murillo- please order UA/culture. thx

## 2017-05-08 NOTE — Telephone Encounter (Signed)
-----   Message from Verlon Au, NP sent at 05/08/2017  2:25 PM EST ----- Urine was negative for UTI. Discussed with patient on phone. She would like to be seen. She has a PET at 8:30 tomorrow. Could you get her on my schedule for tomorrow then call her with the time? Thanks! Ander Purpura

## 2017-05-08 NOTE — Telephone Encounter (Signed)
Patient requested appointment to see NP tomorrow so she is on schedule to see Stacy Murillo in AM

## 2017-05-08 NOTE — Telephone Encounter (Signed)
done

## 2017-05-08 NOTE — Telephone Encounter (Signed)
Patient informed of prescription at pharmacy for her to take. Repeated back to me

## 2017-05-08 NOTE — Telephone Encounter (Signed)
Called and Stacy Murillo answered the phone and he relayed message to [facility] of appointment after her PET Scan tomorrow 1030. She verbally accepted appointment

## 2017-05-09 ENCOUNTER — Inpatient Hospital Stay (HOSPITAL_BASED_OUTPATIENT_CLINIC_OR_DEPARTMENT_OTHER): Payer: BLUE CROSS/BLUE SHIELD | Admitting: Nurse Practitioner

## 2017-05-09 ENCOUNTER — Encounter: Payer: Self-pay | Admitting: Nurse Practitioner

## 2017-05-09 ENCOUNTER — Ambulatory Visit
Admission: RE | Admit: 2017-05-09 | Discharge: 2017-05-09 | Disposition: A | Discharge status: Home / Self Care | Payer: BLUE CROSS/BLUE SHIELD | Source: Ambulatory Visit | Attending: Internal Medicine | Admitting: Internal Medicine

## 2017-05-09 VITALS — BP 137/77 | HR 78 | Temp 97.6°F | Resp 16 | Wt 178.0 lb

## 2017-05-09 DIAGNOSIS — C8338 Diffuse large B-cell lymphoma, lymph nodes of multiple sites: Secondary | ICD-10-CM

## 2017-05-09 DIAGNOSIS — R1030 Lower abdominal pain, unspecified: Secondary | ICD-10-CM | POA: Diagnosis not present

## 2017-05-09 DIAGNOSIS — R309 Painful micturition, unspecified: Secondary | ICD-10-CM

## 2017-05-09 DIAGNOSIS — Z79899 Other long term (current) drug therapy: Secondary | ICD-10-CM

## 2017-05-09 DIAGNOSIS — C7951 Secondary malignant neoplasm of bone: Secondary | ICD-10-CM | POA: Diagnosis not present

## 2017-05-09 LAB — GLUCOSE, CAPILLARY: Glucose-Capillary: 88 mg/dL (ref 65–99)

## 2017-05-09 MED ORDER — FLUDEOXYGLUCOSE F - 18 (FDG) INJECTION
12.8600 | Freq: Once | INTRAVENOUS | Status: AC | PRN
  Administered 2017-05-09: 12.86 via INTRAVENOUS

## 2017-05-09 NOTE — Patient Instructions (Signed)
Dysuria Dysuria is pain or discomfort while urinating. The pain or discomfort may be felt in the tube that carries urine out of the bladder (urethra) or in the surrounding tissue of the genitals. The pain may also be felt in the groin area, lower abdomen, and lower back. You may have to urinate frequently or have the sudden feeling that you have to urinate (urgency). Dysuria can affect both men and women, but is more common in women. Dysuria can be caused by many different things, including:  Urinary tract infection in women.  Infection of the kidney or bladder.  Kidney stones or bladder stones.  Certain sexually transmitted infections (STIs), such as chlamydia.  Dehydration.  Inflammation of the vagina.  Use of certain medicines.  Use of certain soaps or scented products that cause irritation.  Follow these instructions at home: Watch your dysuria for any changes. The following actions may help to reduce any discomfort you are feeling:  Drink enough fluid to keep your urine clear or pale yellow.  Empty your bladder often. Avoid holding urine for long periods of time.  After a bowel movement or urination, women should cleanse from front to back, using each tissue only once.  Empty your bladder after sexual intercourse.  Take medicines only as directed by your health care provider.  If you were prescribed an antibiotic medicine, finish it all even if you start to feel better.  Avoid caffeine, tea, and alcohol. They can irritate the bladder and make dysuria worse. In men, alcohol may irritate the prostate.  Keep all follow-up visits as directed by your health care provider. This is important.  If you had any tests done to find the cause of dysuria, it is your responsibility to obtain your test results. Ask the lab or department performing the test when and how you will get your results. Talk with your health care provider if you have any questions about your results.  Contact a  health care provider if:  You develop pain in your back or sides.  You have a fever.  You have nausea or vomiting.  You have blood in your urine.  You are not urinating as often as you usually do. Get help right away if:  You pain is severe and not relieved with medicines.  You are unable to hold down any fluids.  You or someone else notices a change in your mental function.  You have a rapid heartbeat at rest.  You have shaking or chills.  You feel extremely weak. This information is not intended to replace advice given to you by your health care provider. Make sure you discuss any questions you have with your health care provider. Document Released: 01/27/2004 Document Revised: 10/06/2015 Document Reviewed: 12/24/2013 Elsevier Interactive Patient Education  [employer].  Diverticulosis Diverticulosis is a condition that develops when small pouches (diverticula) form in the wall of the large intestine (colon). The colon is where water is absorbed and stool is formed. The pouches form when the inside layer of the colon pushes through weak spots in the outer layers of the colon. You may have a few pouches or many of them. What are the causes? The cause of this condition is not known. What increases the risk? The following factors may make you more likely to develop this condition:  Being older than age 38. Your risk for this condition increases with age. Diverticulosis is rare among people younger than age 28. By age 84, many people have it.  Eating a low-fiber diet.  Having frequent constipation.  Being overweight.  Not getting enough exercise.  Smoking.  Taking over-the-counter pain medicines, like aspirin and ibuprofen.  Having a family history of diverticulosis.  What are the signs or symptoms? In most people, there are no symptoms of this condition. If you do have symptoms, they may include:  Bloating.  Cramps in the abdomen.  Constipation or  diarrhea.  Pain in the lower left side of the abdomen.  How is this diagnosed? This condition is most often diagnosed during an exam for other colon problems. Because diverticulosis usually has no symptoms, it often cannot be diagnosed independently. This condition may be diagnosed by:  Using a flexible scope to examine the colon (colonoscopy).  Taking an X-ray of the colon after dye has been put into the colon (barium enema).  Doing a CT scan.  How is this treated? You may not need treatment for this condition if you have never developed an infection related to diverticulosis. If you have had an infection before, treatment may include:  Eating a high-fiber diet. This may include eating more fruits, vegetables, and grains.  Taking a fiber supplement.  Taking a live bacteria supplement (probiotic).  Taking medicine to relax your colon.  Taking antibiotic medicines.  Follow these instructions at home:  Drink 6-8 glasses of water or more each day to prevent constipation.  Try not to strain when you have a bowel movement.  If you have had an infection before: ? Eat more fiber as directed by your health care provider or your diet and nutrition specialist (dietitian). ? Take a fiber supplement or probiotic, if your health care provider approves.  Take over-the-counter and prescription medicines only as told by your health care provider.  If you were prescribed an antibiotic, take it as told by your health care provider. Do not stop taking the antibiotic even if you start to feel better.  Keep all follow-up visits as told by your health care provider. This is important. Contact a health care provider if:  You have pain in your abdomen.  You have bloating.  You have cramps.  You have not had a bowel movement in 3 days. Get help right away if:  Your pain gets worse.  Your bloating becomes very bad.  You have a fever or chills, and your symptoms suddenly get  worse.  You vomit.  You have bowel movements that are bloody or black.  You have bleeding from your rectum. Summary  Diverticulosis is a condition that develops when small pouches (diverticula) form in the wall of the large intestine (colon).  You may have a few pouches or many of them.  This condition is most often diagnosed during an exam for other colon problems.  If you have had an infection related to diverticulosis, treatment may include increasing the fiber in your diet, taking supplements, or taking medicines. This information is not intended to replace advice given to you by your health care provider. Make sure you discuss any questions you have with your health care provider. Document Released: 01/26/2004 Document Revised: 03/19/2016 Document Reviewed: 03/19/2016 Elsevier Interactive Patient Education  2017 [employer].

## 2017-05-09 NOTE — Progress Notes (Signed)
Symptom Management Consult note [facility]  Telephone:([phone] [phone]  Patient Care Team: Crecencio Mc, MD as PCP - General (Internal Medicine) Cammie Sickle, MD as Consulting Physician (Internal Medicine) Christene Lye, MD (General Surgery) [name], MD as Consulting Physician (Cardiology)   Name of the patient: Stacy Murillo  [id number]  1955/10/03   Date of visit: 05/09/17  Diagnosis-diffuse large B-cell lymphoma  Chief complaint/ Reason for visit- pain with urination and lower abdominal pain  Heme/Onc history: Patient last seen by primary oncologist, Dr. Rogue Bussing, 04/24/17.  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  Biopsy-proven right axillary lymph node.  03/2017-patches recurrent disease in right axilla/subpectoral region with significant splenic uptake and multiple bone lesions.  Received 2 cycles R-GDP w/o major issues. Platelet count improved throughout. PET was today.   Interval history-patient called clinic yesterday complaining of UTI-like symptoms including pain with urination and dark colored urine.  UA was negative for infection.  Patient requested evaluation stating concerns of history of diverticulitis and questioning if her symptoms could be related.  Today, patient reports burning upon urination has resolved with Pyridium.  No additional pain.  She has increased fluid intake. She has a history of diverticulitis that required hospitalization and is quite concerned with how to prevent future infections.  She has been changing her eating habits recently to control symptoms and has not had recent infection.  Reports history of fluctuating diarrhea and constipation Denies fever, chills, chest pain, shortness of breath.  Denies new lumps or bumps.  States she feels well.  No nausea or vomiting.  No back pain.  Denies blood in  stool or urine, or vaginal bleeding.  Denies pain or bleeding with intercourse.  ECOG FS:0 - Asymptomatic  Review of systems- Review of Systems  Constitutional: Negative for chills, fever, malaise/fatigue and weight loss.  Respiratory: Negative for cough and shortness of breath.   Cardiovascular: Negative for chest pain, palpitations and leg swelling.  Gastrointestinal: Positive for constipation and diarrhea. Negative for abdominal pain, blood in stool, heartburn, melena, nausea and vomiting.  Genitourinary: Negative for dysuria, flank pain, frequency, hematuria and urgency.  Musculoskeletal: Negative for myalgias.  Skin: Negative for itching and rash.  Neurological: Negative for dizziness, weakness and headaches.  Endo/Heme/Allergies: Does not bruise/bleed easily.  Psychiatric/Behavioral: Negative for depression. The patient is nervous/anxious.      Current treatment- s/p 2 cycles R-GDP  No Known Allergies   Past Medical History:  Diagnosis Date  . Arthritis   . Blood dyscrasia   . Cancer ([place])   . Chest pain, unspecified   . Diverticulosis 07/06/15  . Dysrhythmia   . Esophagitis 07/06/15  . Fatigue   . Gastritis 07/06/15  . GERD (gastroesophageal reflux disease)   . 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 Leg cramps   . Lymphoma ([place]) 09/14/15   Monoclonal B cell lymphoma  . Night sweats   . Osteoporosis   . Overweight(278.02)    Obesity  . Palpitations   . Shortness of breath dyspnea   . Tachycardia   . Thrombocytopenia ([place])      Past Surgical History:  Procedure Laterality Date  . ABDOMINAL HYSTERECTOMY  2005  . BONE MARROW BIOPSY  09/14/15  . CHOLECYSTECTOMY  1997  . COLONOSCOPY  07/05/2014  . ESOPHAGOGASTRODUODENOSCOPY  07/05/2014  . INGUINAL LYMPH NODE BIOPSY Right 10/05/2015   Procedure: INGUINAL LYMPH NODE BIOPSY;  Surgeon: Christene Lye, MD;  Location: [facility] OR[facility];  Service: General;  Laterality: Right;  . PERIPHERAL VASCULAR CATHETERIZATION N/A 09/27/2015    Procedure: Glori Luis Cath Insertion;  Surgeon: Algernon Huxley, MD;  Location: [facility];  Service: Cardiovascular;  Laterality: N/A;  . PORTACATH PLACEMENT Right     Social History   Socioeconomic History  . Marital status: Married    Spouse name: Not on file  . Number of children: Not on file  . Years of education: Not on file  . Highest education level: Not on file  Social Needs  . Financial resource strain: Not on file  . Food insecurity - worry: Not on file  . Food insecurity - inability: Not on file  . Transportation needs - medical: Not on file  . Transportation needs - non-medical: Not on file  Occupational History  . Not on file  Tobacco Use  . Smoking status: Never Smoker  . Smokeless tobacco: Never Used  Substance and Sexual Activity  . Alcohol use: No    Alcohol/week: 0.0 oz  . Drug use: No  . Sexual activity: Yes    Birth control/protection: None  Other Topics Concern  . Not on file  Social History Narrative   Married   Does not get regular exercise    Family History  Problem Relation Age of Onset  . Heart disease Father        CABG x 4  . Multiple myeloma Father   . Hypertension Mother   . Pancreatic cancer Mother   . Ulcers Mother   . Asthma Mother   . Brain cancer Maternal Uncle   . Multiple myeloma Maternal Uncle   . Diabetes Neg Hx   . Breast cancer Neg Hx      Current Outpatient Medications:  .  acetaminophen (TYLENOL 8 HOUR) 650 MG CR tablet, Take 650 mg by mouth every 8 (eight) hours as needed for pain., Disp: , Rfl:  .  acyclovir (ZOVIRAX) 400 MG tablet, Take 1 tablet (400 mg total) 2 (two) times daily by mouth. One pill a day [to prevent shingles], Disp: 60 tablet, Rfl: 3 .  Alum Hydroxide-Mag Carbonate (GAVISCON PO), Take by mouth., Disp: , Rfl:  .  CALCIUM PO, Take 600 mg 2 (two) times daily by mouth. , Disp: , Rfl:  .  dexamethasone (DECADRON) 4 MG tablet, Take 10 tablets (40 mg total) by mouth daily. Start the day after cisplatin  chemotherapy for 4 days., Disp: 40 tablet, Rfl: 3 .  Krill Oil 1000 MG CAPS, Take by mouth., Disp: , Rfl:  .  lidocaine-prilocaine (EMLA) cream, , Disp: , Rfl: 2 .  lidocaine-prilocaine (EMLA) cream, Apply to affected area once, Disp: 30 g, Rfl: 3 .  LORazepam (ATIVAN) 0.5 MG tablet, Take 1 tablet (0.5 mg total) by mouth every 8 (eight) hours., Disp: 30 tablet, Rfl: 0 .  ondansetron (ZOFRAN) 8 MG tablet, Take 1 tablet (8 mg total) 2 (two) times daily as needed by mouth. Start on the 3rd day after cisplatin chemotherapy., Disp: 30 tablet, Rfl: 1 .  phenazopyridine (PYRIDIUM) 100 MG tablet, Take 1 tablet (100 mg total) by mouth 3 (three) times daily with meals., Disp: 30 tablet, Rfl: 0 .  prochlorperazine (COMPAZINE) 10 MG tablet, Take 1 tablet (10 mg total) every 6 (six) hours as needed by mouth (Nausea or vomiting)., Disp: 30 tablet, Rfl: 1 .  promethazine (PHENERGAN) 25 MG tablet, Take 1 tablet (25 mg total) by mouth  every 8 (eight) hours as needed for nausea or vomiting., Disp: 30 tablet, Rfl: 0 .  ranitidine (ZANTAC) 150 MG tablet, Take 150 mg by mouth 2 (two) times daily., Disp: , Rfl:  .  XIIDRA 5 % SOLN, 1 drop. , Disp: , Rfl: 4 No current facility-administered medications for this visit.   Facility-Administered Medications Ordered in Other Visits:  .  0.9 %  sodium chloride infusion, , Intravenous, Continuous, Herring, Orville Govern, NP, Stopped at 10/14/15 1312 .  methotrexate (50 mg/ml) 6.3 g in sodium chloride 0.9 % 1,000 mL injection, , Intravenous, Once, Brahmanday, Govinda R, MD .  sodium chloride flush (NS) 0.9 % injection 10 mL, 10 mL, Intravenous, PRN, Cammie Sickle, MD, 10 mL at 10/05/16 1400  Physical exam:  Vitals:   05/09/17 1027 05/09/17 1032  BP: 137/77   Pulse: 78   Resp: 16   Temp: 97.6 F (36.4 C)   TempSrc: Tympanic   Weight:  178 lb (80.7 kg)   Physical Exam  Constitutional: She is oriented to person, place, and time and well-developed, well-nourished, and  in no distress.  HENT:  Head: Atraumatic.  Eyes: Pupils are equal, round, and reactive to light. No scleral icterus.  Neck: Normal range of motion. No thyromegaly present.  Cardiovascular: Normal rate, regular rhythm and normal heart sounds.  Pulmonary/Chest: Effort normal and breath sounds normal. She has no wheezes.  Abdominal: Soft. Bowel sounds are normal. She exhibits no distension and no mass. There is no tenderness. There is no rebound and no guarding.  Musculoskeletal: She exhibits no edema or tenderness.  Lymphadenopathy:    She has no cervical adenopathy.  Neurological: She is alert and oriented to person, place, and time.  Skin: Skin is warm and dry.  Bruise right hand  Psychiatric: Mood and affect normal.     CMP Latest Ref Rng & Units 05/01/2017  Glucose 65 - 99 mg/dL 193(H)  BUN 6 - 20 mg/dL 20  Creatinine 0.44 - 1.00 mg/dL 0.95  Sodium 135 - 145 mmol/L 132(L)  Potassium 3.5 - 5.1 mmol/L 3.8  Chloride 101 - 111 mmol/L 97(L)  CO2 22 - 32 mmol/L 26  Calcium 8.9 - 10.3 mg/dL 9.0  Total Protein 6.5 - 8.1 g/dL 6.5  Total Bilirubin 0.3 - 1.2 mg/dL 0.7  Alkaline Phos 38 - 126 U/L 74  AST 15 - 41 U/L 22  ALT 14 - 54 U/L 26   CBC Latest Ref Rng & Units 05/01/2017  WBC 3.6 - 11.0 K/uL 6.2  Hemoglobin 12.0 - 16.0 g/dL 11.7(L)  Hematocrit 35.0 - 47.0 % 34.1(L)  Platelets 150 - 440 K/uL 162    No images are attached to the encounter.  Nm Pet Image Restag (ps) Skull Base To Thigh  Result Date: 05/09/2017 CLINICAL DATA:  Subsequent treatment strategy for lymphoma. EXAM: NUCLEAR MEDICINE PET SKULL BASE TO THIGH TECHNIQUE: 12.8 mCi F-18 FDG was injected intravenously. Full-ring PET imaging was performed from the skull base to thigh after the radiotracer. CT data was obtained and used for attenuation correction and anatomic localization. FASTING BLOOD GLUCOSE:  Value: 88 mg/dl COMPARISON:  03/21/2017 FINDINGS: NECK: No hypermetabolic lymph nodes in the neck. CHEST: No  hypermetabolic mediastinal or hilar nodes. Index right axillary node measures 0.9 cm and has an SUV max equal to 2.3. Previously 1.8 cm with an SUV max of 50.08. No suspicious pulmonary nodules on the CT scan. ABDOMEN/PELVIS: The spleen measures 10 by 4.8 by 13.4 cm (volume =  340 cm^3). The SUV max associated with the spleen is currently 4.16. Previously the spleen had a volume of 650 cm^3 and had an SUV max of 14.6. No focal areas of hypermetabolism within the spleen. No abnormal uptake within the liver or pancreas. The adrenal glands are unremarkable. No hypermetabolic or enlarged lymph nodes within the abdomen or pelvis. SKELETON: Diffuse radiotracer uptake is identified throughout the axial and appendicular skeleton. The appearance is most compatible with treatment related changes. IMPRESSION: 1. Interval resolution of hypermetabolic right axillary adenopathy. 2. Decrease in volume and degree of FDG uptake associated with the spleen. 3. Diffuse radiotracer uptake throughout the axial and appendicular skeleton is identified which is favored to represent treatment related changes. This may obscure any underlying focal areas of increased uptake due to tumor. Electronically Signed   By: Kerby Moors M.D.   On: 05/09/2017 10:44     Assessment and plan: patient is a 61 y.o. female with history of diffuse large B-cell lymphoma who presents to symptom management clinic today for complaints of urinary pain and lower abdominal pain.  1.  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  PET 11/18 shows recurrence in right axilla/subpectoral region significant splenic uptake and multiple bone lesions.  Currently status post 2 cycles R-GDP.  Clinically NED.  The patient scheduled to see primary oncologist, Dr. Rogue Bussing, to discuss PET results.   2.  Urinary pain and lower abdominal pain-no evidence of infection  on UA and symptoms well controlled on Pyridium.  Asymptomatic today.  Etiology unclear.  Painful bladder syndrome?  IVS?  Diverticulosis?   Discussed using Pyridium for short courses only at this time.  Discussed that urinary and GI inflammatory symptoms may be triggered by some foods or beverages.  Discussed some of these. Continue to monitor at this time.  If symptoms return or worsen can reevaluate.   rtc 05/15/16 for labs and discussion of PET results with Dr. Rogue Bussing. If symptoms persist or worsen, can re-evaluate.    Visit Diagnosis 1. Urinary pain   2. Diffuse large B-cell lymphoma of lymph nodes of multiple regions ([place])   3. Lower abdominal pain     Patient expressed understanding and was in agreement with this plan. She also understands that She can call clinic at any time with any questions, concerns, or complaints.   A total of (30) minutes of face-to-face time was spent with this patient with greater than 50% of that time in counseling and care-coordination.   Beckey Rutter, DNP, AGNP-C [place] at [facility] [phone] (office) 05/09/17 3:32 PM

## 2017-05-10 ENCOUNTER — Telehealth: Payer: Self-pay | Admitting: Internal Medicine

## 2017-05-10 NOTE — Telephone Encounter (Signed)
Please inform patient that PET scan shows good response from therapy.  For now proceed with cycle #3 as planned.   I have reached out to [facility] again this morning. Thx

## 2017-05-10 NOTE — Telephone Encounter (Signed)
Patient notified of Dr. Aletha Halim comments and verbalized understanding.

## 2017-05-11 LAB — URINE CULTURE: Culture: <10000 — AB

## 2017-05-13 ENCOUNTER — Other Ambulatory Visit: Payer: Self-pay | Admitting: Internal Medicine

## 2017-05-13 DIAGNOSIS — C8338 Diffuse large B-cell lymphoma, lymph nodes of multiple sites: Secondary | ICD-10-CM

## 2017-05-15 ENCOUNTER — Other Ambulatory Visit: Payer: Self-pay

## 2017-05-15 ENCOUNTER — Inpatient Hospital Stay: Payer: BLUE CROSS/BLUE SHIELD | Attending: Internal Medicine | Admitting: Internal Medicine

## 2017-05-15 ENCOUNTER — Inpatient Hospital Stay: Payer: BLUE CROSS/BLUE SHIELD

## 2017-05-15 ENCOUNTER — Telehealth: Payer: Self-pay | Admitting: Internal Medicine

## 2017-05-15 VITALS — BP 150/84 | HR 77 | Temp 97.8°F | Resp 18

## 2017-05-15 DIAGNOSIS — Z79899 Other long term (current) drug therapy: Secondary | ICD-10-CM | POA: Diagnosis not present

## 2017-05-15 DIAGNOSIS — C8338 Diffuse large B-cell lymphoma, lymph nodes of multiple sites: Secondary | ICD-10-CM | POA: Diagnosis present

## 2017-05-15 DIAGNOSIS — Z86718 Personal history of other venous thrombosis and embolism: Secondary | ICD-10-CM | POA: Insufficient documentation

## 2017-05-15 DIAGNOSIS — R161 Splenomegaly, not elsewhere classified: Secondary | ICD-10-CM | POA: Insufficient documentation

## 2017-05-15 DIAGNOSIS — Z7901 Long term (current) use of anticoagulants: Secondary | ICD-10-CM | POA: Diagnosis not present

## 2017-05-15 DIAGNOSIS — Z923 Personal history of irradiation: Secondary | ICD-10-CM

## 2017-05-15 DIAGNOSIS — K5732 Diverticulitis of large intestine without perforation or abscess without bleeding: Secondary | ICD-10-CM | POA: Insufficient documentation

## 2017-05-15 DIAGNOSIS — K5790 Diverticulosis of intestine, part unspecified, without perforation or abscess without bleeding: Secondary | ICD-10-CM | POA: Diagnosis not present

## 2017-05-15 DIAGNOSIS — K219 Gastro-esophageal reflux disease without esophagitis: Secondary | ICD-10-CM | POA: Insufficient documentation

## 2017-05-15 DIAGNOSIS — E669 Obesity, unspecified: Secondary | ICD-10-CM | POA: Diagnosis not present

## 2017-05-15 DIAGNOSIS — Z5111 Encounter for antineoplastic chemotherapy: Secondary | ICD-10-CM | POA: Insufficient documentation

## 2017-05-15 DIAGNOSIS — R740 Nonspecific elevation of levels of transaminase and lactic acid dehydrogenase [LDH]: Secondary | ICD-10-CM | POA: Diagnosis not present

## 2017-05-15 DIAGNOSIS — C7951 Secondary malignant neoplasm of bone: Secondary | ICD-10-CM | POA: Insufficient documentation

## 2017-05-15 DIAGNOSIS — M1990 Unspecified osteoarthritis, unspecified site: Secondary | ICD-10-CM | POA: Diagnosis not present

## 2017-05-15 DIAGNOSIS — C8583 Other specified types of non-Hodgkin lymphoma, intra-abdominal lymph nodes: Secondary | ICD-10-CM

## 2017-05-15 LAB — CBC WITH DIFFERENTIAL/PLATELET
Basophils Absolute: 0.1 10*3/uL (ref 0–0.1)
Basophils Relative: 0 %
EOS PCT: 0 %
Eosinophils Absolute: 0 10*3/uL (ref 0–0.7)
HCT: 29.8 % — ABNORMAL LOW (ref 35.0–47.0)
HEMOGLOBIN: 10.1 g/dL — AB (ref 12.0–16.0)
LYMPHS ABS: 1.2 10*3/uL (ref 1.0–3.6)
LYMPHS PCT: 7 %
MCH: 30.5 pg (ref 26.0–34.0)
MCHC: 33.9 g/dL (ref 32.0–36.0)
MCV: 89.9 fL (ref 80.0–100.0)
MONOS PCT: 8 %
Monocytes Absolute: 1.4 10*3/uL — ABNORMAL HIGH (ref 0.2–0.9)
NEUTROS PCT: 85 %
Neutro Abs: 14.2 10*3/uL — ABNORMAL HIGH (ref 1.4–6.5)
Platelets: 217 10*3/uL (ref 150–440)
RBC: 3.31 MIL/uL — AB (ref 3.80–5.20)
RDW: 22.4 % — ABNORMAL HIGH (ref 11.5–14.5)
WBC: 16.9 10*3/uL — AB (ref 3.6–11.0)

## 2017-05-15 LAB — COMPREHENSIVE METABOLIC PANEL
ALT: 12 U/L — AB (ref 14–54)
AST: 21 U/L (ref 15–41)
Albumin: 4 g/dL (ref 3.5–5.0)
Alkaline Phosphatase: 115 U/L (ref 38–126)
Anion gap: 8 (ref 5–15)
BUN: 10 mg/dL (ref 6–20)
CHLORIDE: 105 mmol/L (ref 101–111)
CO2: 27 mmol/L (ref 22–32)
CREATININE: 1.02 mg/dL — AB (ref 0.44–1.00)
Calcium: 9.1 mg/dL (ref 8.9–10.3)
GFR calc Af Amer: >60 mL/min (ref >60)
GFR, EST NON AFRICAN AMERICAN: 58 mL/min — AB (ref >60)
Glucose, Bld: 126 mg/dL — ABNORMAL HIGH (ref 65–99)
POTASSIUM: 3.6 mmol/L (ref 3.5–5.1)
SODIUM: 140 mmol/L (ref 135–145)
Total Bilirubin: 0.4 mg/dL (ref 0.3–1.2)
Total Protein: 6.5 g/dL (ref 6.5–8.1)

## 2017-05-15 LAB — LACTATE DEHYDROGENASE: LDH: 299 U/L — AB (ref 98–192)

## 2017-05-15 MED ORDER — ACETAMINOPHEN 325 MG PO TABS
650.0000 mg | ORAL_TABLET | Freq: Once | ORAL | Status: AC
  Administered 2017-05-15: 650 mg via ORAL
  Filled 2017-05-15: qty 2

## 2017-05-15 MED ORDER — HEPARIN SOD (PORK) LOCK FLUSH 100 UNIT/ML IV SOLN
500.0000 [IU] | Freq: Once | INTRAVENOUS | Status: DC
  Filled 2017-05-15: qty 5

## 2017-05-15 MED ORDER — HEPARIN SOD (PORK) LOCK FLUSH 100 UNIT/ML IV SOLN: 500.0000 [IU] | Freq: Once | INTRAVENOUS | Status: DC | PRN

## 2017-05-15 MED ORDER — SODIUM CHLORIDE 0.9 % IV SOLN
Freq: Once | INTRAVENOUS | Status: AC
  Administered 2017-05-15: 11:00:00 via INTRAVENOUS
  Filled 2017-05-15: qty 1000

## 2017-05-15 MED ORDER — SODIUM CHLORIDE 0.9 % IV SOLN
1800.0000 mg | Freq: Once | INTRAVENOUS | Status: AC
  Administered 2017-05-15: 1800 mg via INTRAVENOUS
  Filled 2017-05-15: qty 26.3

## 2017-05-15 MED ORDER — SODIUM CHLORIDE 0.9 % IV SOLN
75.0000 mg/m2 | Freq: Once | INTRAVENOUS | Status: AC
  Administered 2017-05-15: 140 mg via INTRAVENOUS
  Filled 2017-05-15: qty 100

## 2017-05-15 MED ORDER — SODIUM CHLORIDE 0.9 % IV SOLN
Freq: Once | INTRAVENOUS | Status: AC
  Administered 2017-05-15: 15:00:00 via INTRAVENOUS
  Filled 2017-05-15: qty 5

## 2017-05-15 MED ORDER — DEXAMETHASONE 4 MG PO TABS: ORAL_TABLET | ORAL | 0 refills | Status: DC

## 2017-05-15 MED ORDER — DIPHENHYDRAMINE HCL 25 MG PO CAPS
50.0000 mg | ORAL_CAPSULE | Freq: Once | ORAL | Status: AC
  Administered 2017-05-15: 50 mg via ORAL
  Filled 2017-05-15: qty 2

## 2017-05-15 MED ORDER — SODIUM CHLORIDE 0.9 % IV SOLN
375.0000 mg/m2 | Freq: Once | INTRAVENOUS | Status: AC
  Administered 2017-05-15: 700 mg via INTRAVENOUS
  Filled 2017-05-15: qty 20

## 2017-05-15 MED ORDER — PALONOSETRON HCL INJECTION 0.25 MG/5ML
0.2500 mg | Freq: Once | INTRAVENOUS | Status: AC
  Administered 2017-05-15: 0.25 mg via INTRAVENOUS
  Filled 2017-05-15: qty 5

## 2017-05-15 MED ORDER — POTASSIUM CHLORIDE 2 MEQ/ML IV SOLN
Freq: Once | INTRAVENOUS | Status: AC
  Administered 2017-05-15: 11:00:00 via INTRAVENOUS
  Filled 2017-05-15: qty 1000

## 2017-05-15 MED ORDER — SODIUM CHLORIDE 0.9 % IV SOLN: 375.0000 mg/m2 | Freq: Once | INTRAVENOUS | Status: DC

## 2017-05-15 MED ORDER — SODIUM CHLORIDE 0.9% FLUSH
10.0000 mL | INTRAVENOUS | Status: DC | PRN
  Administered 2017-05-15: 10 mL via INTRAVENOUS
  Filled 2017-05-15: qty 10

## 2017-05-15 NOTE — Telephone Encounter (Signed)
Heather-Please cancel the chemotherapy- RGDP as scheduled for 1/23 and 1/30. Keep labs/appt with me as planned. Dr.B

## 2017-05-15 NOTE — Telephone Encounter (Signed)
msg sent to scheduling team to cnl chemos on 1/23 and 1/30. Patient aware of plan per Dr. Jacinto Reap

## 2017-05-15 NOTE — Progress Notes (Signed)
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 CT- 1-2 CM LN subpectoral/medistinal/ retro-peritoneal/pelvic/ Right inguinal LN 1.6cm- Bx- DLBCL- ABC; myc-POS; FISH gene re-arragement-NEG. PET- MULTIPLE LN/ Bone involvement; R-CHOP x6- CR'# OCT 2017- PET scan- CR; DEC 22nd 2017- BMBx- "atypical large B cells- <1%  [[facility]; II opinion- Dr.Grover- review of path- not concerning for residual lymphoma]; recommend surviellance  # March 26th  2018PET- NED  # Lumbar puncture- difficult-spinal headache. S/p high dose MXT x4.  ------------------------------------------------------------------- # NOV 2018-recurrent diffuse large B-cell lymphoma [right axilla lymph node biopsy proven]; PET- multiple bone lesions; axillary adenopathy; splenomegaly uptake  # Nov 16th 2018- R-GDP     ---------------------------------------------------------- # June 2017-Elevated LFTs- valacylovir/diflucan; resolved.   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  Patient continues to complain of nausea without any significant vomiting after chemotherapy.  Has been drinking fluids.  Denies any significant lumps or bumps. Denies any cough or shortness of breath or chest pain.  No tingling or numbness.  Appetite is fair.  No hearing loss.  No dizzy spells.  No falls.  REVIEW OF SYSTEMS:  A complete 10 point review of system is done which is negative except mentioned above/history of present illness.   PAST MEDICAL HISTORY :  Past Medical History:  Diagnosis Date  . Arthritis   . Blood dyscrasia   . Cancer (HCC)   . Chest pain, unspecified   . Diverticulosis 07/06/15  . Dysrhythmia   . Esophagitis 07/06/15  . Fatigue   . Gastritis 07/06/15  . GERD (gastroesophageal reflux disease)   . 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 Leg cramps   . 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 Night sweats   . Osteoporosis   . Overweight(278.02)    Obesity  . Palpitations   . Shortness of breath dyspnea   . Tachycardia   . Thrombocytopenia (HCC)     PAST SURGICAL HISTORY :   Past Surgical History:  Procedure Laterality Date  . ABDOMINAL HYSTERECTOMY  2005  . BONE MARROW BIOPSY  09/14/15  . CHOLECYSTECTOMY  1997  . COLONOSCOPY  07/05/2014  . ESOPHAGOGASTRODUODENOSCOPY  07/05/2014  . INGUINAL LYMPH NODE BIOPSY Right 10/05/2015   Procedure: INGUINAL LYMPH   NODE BIOPSY;  Surgeon: Seeplaputhur G Sankar, MD;  Location: [facility] ORS;  Service: General;  Laterality: Right;  . PERIPHERAL VASCULAR CATHETERIZATION N/A 09/27/2015   Procedure: Porta Cath Insertion;  Surgeon: Jason S Dew, MD;  Location: [facility] INVASIVE CV LAB;  Service: Cardiovascular;   Laterality: N/A;  . PORTACATH PLACEMENT Right     FAMILY HISTORY :   Family History  Problem Relation Age of Onset  . Heart disease Father        CABG x 4  . Multiple myeloma Father   . Hypertension Mother   . Pancreatic cancer Mother   . Ulcers Mother   . Asthma Mother   . Brain cancer Maternal Uncle   . Multiple myeloma Maternal Uncle   . Diabetes Neg Hx   . Breast cancer Neg Hx     SOCIAL HISTORY:   Social History   Tobacco Use  . Smoking status: Never Smoker  . Smokeless tobacco: Never Used  Substance Use Topics  . Alcohol use: No    Alcohol/week: 0.0 oz  . Drug use: No    ALLERGIES:  has No Known Allergies.  MEDICATIONS:  Current Outpatient Medications  Medication Sig Dispense Refill  . acyclovir (ZOVIRAX) 400 MG tablet Take 1 tablet (400 mg total) 2 (two) times daily by mouth. One pill a day [to prevent shingles] 60 tablet 3  . Alum Hydroxide-Mag Carbonate (GAVISCON PO) Take by mouth.    . CALCIUM PO Take 600 mg 2 (two) times daily by mouth.     . dexamethasone (DECADRON) 4 MG tablet Take 10 tablets (40 mg total) by mouth daily. Start the day after cisplatin chemotherapy for 4 days. 40 tablet 3  . Krill Oil 1000 MG CAPS Take by mouth.    . lidocaine-prilocaine (EMLA) cream Apply to affected area once 30 g 3  . LORazepam (ATIVAN) 0.5 MG tablet Take 1 tablet (0.5 mg total) by mouth every 8 (eight) hours. 30 tablet 0  . ondansetron (ZOFRAN) 8 MG tablet Take 1 tablet (8 mg total) 2 (two) times daily as needed by mouth. Start on the 3rd day after cisplatin chemotherapy. 30 tablet 1  . prochlorperazine (COMPAZINE) 10 MG tablet Take 1 tablet (10 mg total) every 6 (six) hours as needed by mouth (Nausea or vomiting). 30 tablet 1  . promethazine (PHENERGAN) 25 MG tablet Take 1 tablet (25 mg total) by mouth every 8 (eight) hours as needed for nausea or vomiting. 30 tablet 0  . ranitidine (ZANTAC) 150 MG tablet Take 150 mg by mouth 2 (two) times daily.    . XIIDRA 5 % SOLN 1  drop.   4  . acetaminophen (TYLENOL 8 HOUR) 650 MG CR tablet Take 650 mg by mouth every 8 (eight) hours as needed for pain.    . dexamethasone (DECADRON) 4 MG tablet One tablet a day x 4 days after finishing your dex 40 mg/day. 6 tablet 0   No current facility-administered medications for this visit.    Facility-Administered Medications Ordered in Other Visits  Medication Dose Route Frequency Provider Last Rate Last Dose  . 0.9 %  sodium chloride infusion   Intravenous Continuous Herring, Leslie F, NP   Stopped at 10/14/15 1312  . CISplatin (PLATINOL) 140 mg in sodium chloride 0.9 % 500 mL chemo infusion  75 mg/m2 (Treatment Plan Recorded) Intravenous Once ,  R, MD      . heparin lock flush 100 unit/mL  500 Units Intravenous Once ,  R, MD      .   heparin lock flush 100 unit/mL  500 Units Intracatheter Once PRN Cammie Sickle, MD      . methotrexate (50 mg/ml) 6.3 g in sodium chloride 0.9 % 1,000 mL injection   Intravenous Once Charlaine Dalton R, MD      . sodium chloride flush (NS) 0.9 % injection 10 mL  10 mL Intravenous PRN Cammie Sickle, MD   10 mL at 10/05/16 1400  . sodium chloride flush (NS) 0.9 % injection 10 mL  10 mL Intravenous PRN Cammie Sickle, MD   10 mL at 05/15/17 0908    PHYSICAL EXAMINATION: ECOG PERFORMANCE STATUS: 1 - Symptomatic but completely ambulatory  BP (!) 150/84   Pulse 77   Temp 97.8 F (36.6 C) (Tympanic)   Resp 18   There were no vitals filed for this visit.  GENERAL: Well-nourished well-developed; Alert, no distress and comfortable.   Accompanied by her husband.  EYES: no pallor or icterus OROPHARYNX: no thrush; good dentition  NECK: supple, no masses felt LYMPH:  no palpable lymphadenopathy in the cervical, axillary or inguinal regions LUNGS: clear to auscultation and  No wheeze or crackles HEART/CVS: regular rate & rhythm and no murmurs; No lower extremity edema. ABDOMEN:abdomen soft,  non-tender and normal bowel sounds; positive for splenomegaly. Musculoskeletal:no cyanosis of digits and no clubbing  PSYCH: alert & oriented x 3 with fluent speech NEURO: no focal motor/sensory deficits SKIN:  no rashes or significant lesions; MediPort in place. LABORATORY DATA:  I have reviewed the data as listed    Component Value Date/Time   NA 140 05/15/2017 0901   NA 139 06/29/2010 0749   K 3.6 05/15/2017 0901   CL 105 05/15/2017 0901   CO2 27 05/15/2017 0901   GLUCOSE 126 (H) 05/15/2017 0901   BUN 10 05/15/2017 0901   BUN 16 06/29/2010 0749   CREATININE 1.02 (H) 05/15/2017 0901   CALCIUM 9.1 05/15/2017 0901   PROT 6.5 05/15/2017 0901   ALBUMIN 4.0 05/15/2017 0901   AST 21 05/15/2017 0901   ALT 12 (L) 05/15/2017 0901   ALKPHOS 115 05/15/2017 0901   BILITOT 0.4 05/15/2017 0901   GFRNONAA 58 (L) 05/15/2017 0901   GFRAA >60 05/15/2017 0901    No results found for: SPEP, UPEP  Lab Results  Component Value Date   WBC 16.9 (H) 05/15/2017   NEUTROABS 14.2 (H) 05/15/2017   HGB 10.1 (L) 05/15/2017   HCT 29.8 (L) 05/15/2017   MCV 89.9 05/15/2017   PLT 217 05/15/2017      Chemistry      Component Value Date/Time   NA 140 05/15/2017 0901   NA 139 06/29/2010 0749   K 3.6 05/15/2017 0901   CL 105 05/15/2017 0901   CO2 27 05/15/2017 0901   BUN 10 05/15/2017 0901   BUN 16 06/29/2010 0749   CREATININE 1.02 (H) 05/15/2017 0901   GLU 93 06/29/2010 0749      Component Value Date/Time   CALCIUM 9.1 05/15/2017 0901   ALKPHOS 115 05/15/2017 0901   AST 21 05/15/2017 0901   ALT 12 (L) 05/15/2017 0901   BILITOT 0.4 05/15/2017 0901     IMPRESSION: Acute sigmoid diverticulitis affecting the distal sigmoid colon. No abscess.   Electronically Signed   By: Nelson Chimes M.D.   On: 02/15/2017 13:44  IMPRESSION: 1. Interval resolution of hypermetabolic right axillary adenopathy. 2. Decrease in volume and degree of FDG uptake associated with the spleen. 3. Diffuse  radiotracer uptake throughout the  axial and appendicular skeleton is identified which is favored to represent treatment related changes. This may obscure any underlying focal areas of increased uptake due to tumor.   Electronically Signed   By: Taylor  Stroud M.D.   On: 05/09/2017 10:44  RADIOGRAPHIC STUDIES: I have personally reviewed the radiological images as listed and agreed with the findings in the report. No results found.   ASSESSMENT & PLAN:  Diffuse large B-cell lymphoma of lymph nodes of multiple regions (HCC) # RELAPSED Diffuse large B-cell lymphoma ABC subtype/ leukemic cells noted in the blood [initial may 2017]. Biopsy proven R ax LN].  Currently on all GDP chemotherapy status post 2 cycles tolerating well except for moderate nausea  [see discussion below]; DEC 27th PET 2018- IMPROVED disease in the right axilla/subpectoral region; also significant  Improvement of splenic uptake;BUT multiple bone uptake likely-from chemotherapy rather than worsening bone metastasis.   # proceed with R-GDP s/p cycle #3 day-1 Labs today reviewed;  acceptable for treatment today.     # Nausea-grade 2; from cisplatin.  Discussed multiple options including-continue dexamethasone 4 mg/day after the 4 days of Dex 40; discussed regarding new scripts given.  # weekly cbc/cmp/ldh- 1 week-gem; follow up in 3 weeks/ labs; will call re: PET/bone marrow; awaiting to discuss with Dr.Vincent.   Addendum: LDH elevated at 290; question significance given the improvement of the scan.  Monitor closely.  # I reviewed the blood work- with the patient in detail; also reviewed the imaging independently [as summarized above]; and with the patient in detail.    Orders Placed This Encounter  Procedures  . CBC with Differential    Standing Status:   Future    Standing Expiration Date:   05/15/2018  . Magnesium    Standing Status:   Future    Standing Expiration Date:   05/15/2018  . Basic metabolic panel     Standing Status:   Future    Standing Expiration Date:   05/15/2018  . Lactate dehydrogenase    Standing Status:   Future    Standing Expiration Date:   05/15/2018  . CBC with Differential    Standing Status:   Future    Standing Expiration Date:   05/15/2018  . Basic metabolic panel    Standing Status:   Future    Standing Expiration Date:   05/15/2018  . Lactate dehydrogenase    Standing Status:   Future    Standing Expiration Date:   05/15/2018  . Magnesium    Standing Status:   Future    Standing Expiration Date:   05/15/2018  . CBC with Differential    Standing Status:   Future    Standing Expiration Date:   05/15/2018  . Comprehensive metabolic panel    Standing Status:   Future    Standing Expiration Date:   05/15/2018  . Lactate dehydrogenase    Standing Status:   Future    Standing Expiration Date:   05/15/2018  . Magnesium    Standing Status:   Future    Standing Expiration Date:   05/15/2018  . CBC with Differential    Standing Status:   Future    Standing Expiration Date:   05/15/2018  . Basic metabolic panel    Standing Status:   Future    Standing Expiration Date:   05/15/2018  . Lactate dehydrogenase    Standing Status:   Future    Standing Expiration Date:   05/15/2018  . Potassium      Standing Status:   Future    Standing Expiration Date:   05/15/2018      R , MD 05/15/2017 3:56 PM 

## 2017-05-15 NOTE — Assessment & Plan Note (Addendum)
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 Biopsy proven R ax LN].  Currently on all GDP chemotherapy status post 2 cycles tolerating well except for moderate nausea  [see discussion below]; DEC 27th PET 2018- IMPROVED disease in the right axilla/subpectoral region; also significant  Improvement of splenic uptake;BUT multiple bone uptake likely-from chemotherapy rather than worsening bone metastasis.   # proceed with R-GDP s/p cycle #3 day-1 Labs today reviewed;  acceptable for treatment today.     # Nausea-grade 2; from cisplatin.  Discussed multiple options including-continue dexamethasone 4 mg/day after the 4 days of Dex 40; discussed regarding new scripts given.  # weekly cbc/cmp/ldh- 1 week-gem; follow up in 3 weeks/ labs; will call re: PET/bone marrow; awaiting to discuss with Dr.Vincent.   Addendum: LDH elevated at 290; question significance given the improvement of the scan.  Monitor closely.  # I reviewed the blood work- with the patient in detail; also reviewed the imaging independently [as summarized above]; and with the patient in detail.

## 2017-05-22 ENCOUNTER — Inpatient Hospital Stay: Payer: BLUE CROSS/BLUE SHIELD

## 2017-05-22 VITALS — BP 116/76 | HR 88 | Temp 96.4°F | Resp 18 | Wt 177.0 lb

## 2017-05-22 DIAGNOSIS — C8338 Diffuse large B-cell lymphoma, lymph nodes of multiple sites: Secondary | ICD-10-CM

## 2017-05-22 DIAGNOSIS — C8583 Other specified types of non-Hodgkin lymphoma, intra-abdominal lymph nodes: Secondary | ICD-10-CM

## 2017-05-22 LAB — CBC WITH DIFFERENTIAL/PLATELET
BASOS ABS: 0 10*3/uL (ref 0–0.1)
Basophils Relative: 0 %
EOS PCT: 0 %
Eosinophils Absolute: 0 10*3/uL (ref 0–0.7)
HEMATOCRIT: 32.6 % — AB (ref 35.0–47.0)
HEMOGLOBIN: 11 g/dL — AB (ref 12.0–16.0)
LYMPHS ABS: 0.2 10*3/uL — AB (ref 1.0–3.6)
LYMPHS PCT: 2 %
MCH: 30.4 pg (ref 26.0–34.0)
MCHC: 33.8 g/dL (ref 32.0–36.0)
MCV: 89.7 fL (ref 80.0–100.0)
Monocytes Absolute: 0.3 10*3/uL (ref 0.2–0.9)
Monocytes Relative: 3 %
NEUTROS ABS: 11.2 10*3/uL — AB (ref 1.4–6.5)
NEUTROS PCT: 95 %
Platelets: 189 10*3/uL (ref 150–440)
RBC: 3.63 MIL/uL — AB (ref 3.80–5.20)
RDW: 22.2 % — ABNORMAL HIGH (ref 11.5–14.5)
WBC: 11.7 10*3/uL — AB (ref 3.6–11.0)

## 2017-05-22 LAB — BASIC METABOLIC PANEL
Anion gap: 11 (ref 5–15)
BUN: 27 mg/dL — ABNORMAL HIGH (ref 6–20)
CHLORIDE: 97 mmol/L — AB (ref 101–111)
CO2: 25 mmol/L (ref 22–32)
CREATININE: 0.96 mg/dL (ref 0.44–1.00)
Calcium: 9.2 mg/dL (ref 8.9–10.3)
GFR calc non Af Amer: >60 mL/min (ref >60)
Glucose, Bld: 154 mg/dL — ABNORMAL HIGH (ref 65–99)
POTASSIUM: 4.1 mmol/L (ref 3.5–5.1)
Sodium: 133 mmol/L — ABNORMAL LOW (ref 135–145)

## 2017-05-22 LAB — LACTATE DEHYDROGENASE: LDH: 175 U/L (ref 98–192)

## 2017-05-22 LAB — MAGNESIUM: MAGNESIUM: 1.8 mg/dL (ref 1.7–2.4)

## 2017-05-22 MED ORDER — PROCHLORPERAZINE MALEATE 10 MG PO TABS
10.0000 mg | ORAL_TABLET | Freq: Once | ORAL | Status: AC
  Administered 2017-05-22: 10 mg via ORAL
  Filled 2017-05-22: qty 1

## 2017-05-22 MED ORDER — HEPARIN SOD (PORK) LOCK FLUSH 100 UNIT/ML IV SOLN
500.0000 [IU] | Freq: Once | INTRAVENOUS | Status: AC | PRN
  Administered 2017-05-22: 500 [IU]
  Filled 2017-05-22: qty 5

## 2017-05-22 MED ORDER — SODIUM CHLORIDE 0.9 % IV SOLN
1800.0000 mg | Freq: Once | INTRAVENOUS | Status: AC
  Administered 2017-05-22: 1800 mg via INTRAVENOUS
  Filled 2017-05-22: qty 26.3

## 2017-05-22 MED ORDER — SODIUM CHLORIDE 0.9% FLUSH
10.0000 mL | INTRAVENOUS | Status: DC | PRN
  Filled 2017-05-22: qty 10

## 2017-05-22 MED ORDER — SODIUM CHLORIDE 0.9 % IV SOLN
Freq: Once | INTRAVENOUS | Status: AC
Start: 2017-05-22 — End: 2017-05-22
  Administered 2017-05-22: 14:00:00 via INTRAVENOUS
  Filled 2017-05-22: qty 1000

## 2017-05-22 MED ORDER — PEGFILGRASTIM 6 MG/0.6ML ~~LOC~~ PSKT
6.0000 mg | PREFILLED_SYRINGE | Freq: Once | SUBCUTANEOUS | Status: AC
  Administered 2017-05-22: 6 mg via SUBCUTANEOUS
  Filled 2017-05-22: qty 0.6

## 2017-05-23 ENCOUNTER — Other Ambulatory Visit: Payer: Self-pay | Admitting: Internal Medicine

## 2017-05-23 DIAGNOSIS — C8338 Diffuse large B-cell lymphoma, lymph nodes of multiple sites: Secondary | ICD-10-CM

## 2017-05-23 NOTE — Progress Notes (Signed)
Schedule  bone marrow Biopsy in 1 week; follow up as planned.

## 2017-05-23 NOTE — Progress Notes (Signed)
I also spoke with the patient regarding the plan of care. I explained that Dr. Rogue Bussing is waiting on call back from [facility] to determine the timing of ordering the next pet scan. At this time, DR. Rogue Bussing will proceed with ordering the bone marrow biopsy.  Per patient's request, I also sent a msg electronically to Noreene Filbert (coordinator at [facility]). I asked Jana Half to have Dr. Evette Doffing reach out to Dr. Rogue Bussing as soon as possible to discuss the patient's care.

## 2017-05-23 NOTE — Progress Notes (Signed)
Bone marrow biopsy worksheet-faxed to spec. Schedulers.

## 2017-05-24 NOTE — Progress Notes (Signed)
Contacted patient. Her bone marrow biospy has been scheduled for 05/29/17. Patient instructed to arrive at 7:30am. She was instructed to arrive 60 minutes prior to appointment time. Also, pt instructed not to eat or drink anything after midnight (6-8 hours NPO) except blood pressure/heart/seizure medications. Pt instructed to take with just a sip of water. Patient also instructed to bring a driver to this apt.  Pt gave verbal understanding that [facility] will set up the pet scan. I explained to the patient that Dr. Evette Doffing and Dr. Rogue Bussing spoke yesterday about her care. She will anticipate a phone call from [facility] to arrange the next pet scan.  She thanked me for updating her on the care.

## 2017-05-28 ENCOUNTER — Other Ambulatory Visit: Payer: Self-pay | Admitting: Radiology

## 2017-05-29 ENCOUNTER — Ambulatory Visit
Admission: RE | Admit: 2017-05-29 | Discharge: 2017-05-29 | Disposition: A | Discharge status: Home / Self Care | Payer: BLUE CROSS/BLUE SHIELD | Source: Ambulatory Visit | Attending: Internal Medicine | Admitting: Internal Medicine

## 2017-05-29 ENCOUNTER — Other Ambulatory Visit: Payer: BLUE CROSS/BLUE SHIELD

## 2017-05-29 ENCOUNTER — Other Ambulatory Visit (HOSPITAL_COMMUNITY)
Admission: RE | Admit: 2017-05-29 | Disposition: A | Discharge status: Home / Self Care | Payer: BLUE CROSS/BLUE SHIELD | Source: Ambulatory Visit | Attending: Internal Medicine | Admitting: Internal Medicine

## 2017-05-29 ENCOUNTER — Other Ambulatory Visit: Payer: Self-pay | Admitting: *Deleted

## 2017-05-29 DIAGNOSIS — C8338 Diffuse large B-cell lymphoma, lymph nodes of multiple sites: Secondary | ICD-10-CM | POA: Diagnosis not present

## 2017-05-29 LAB — CBC WITH DIFFERENTIAL/PLATELET
BASOS PCT: 0 %
Basophils Absolute: 0 10*3/uL (ref 0–0.1)
EOS ABS: 0 10*3/uL (ref 0–0.7)
Eosinophils Relative: 0 %
HEMATOCRIT: 27.4 % — AB (ref 35.0–47.0)
Hemoglobin: 9.3 g/dL — ABNORMAL LOW (ref 12.0–16.0)
Lymphocytes Relative: 11 %
Lymphs Abs: 1.1 10*3/uL (ref 1.0–3.6)
MCH: 30.9 pg (ref 26.0–34.0)
MCHC: 33.9 g/dL (ref 32.0–36.0)
MCV: 91.1 fL (ref 80.0–100.0)
MONOS PCT: 6 %
Monocytes Absolute: 0.6 10*3/uL (ref 0.2–0.9)
Neutro Abs: 8.6 10*3/uL — ABNORMAL HIGH (ref 1.4–6.5)
Neutrophils Relative %: 83 %
PLATELETS: 33 10*3/uL — AB (ref 150–440)
RBC: 3.01 MIL/uL — ABNORMAL LOW (ref 3.80–5.20)
RDW: 22.5 % — ABNORMAL HIGH (ref 11.5–14.5)
WBC: 10.3 10*3/uL (ref 3.6–11.0)

## 2017-05-29 MED ORDER — FENTANYL CITRATE (PF) 100 MCG/2ML IJ SOLN
INTRAMUSCULAR | Status: AC
  Filled 2017-05-29: qty 4

## 2017-05-29 MED ORDER — MIDAZOLAM HCL 5 MG/5ML IJ SOLN
INTRAMUSCULAR | Status: AC
  Filled 2017-05-29: qty 5

## 2017-05-29 MED ORDER — FENTANYL CITRATE (PF) 100 MCG/2ML IJ SOLN
INTRAMUSCULAR | Status: AC | PRN
  Administered 2017-05-29 (×2): 25 ug via INTRAVENOUS
  Administered 2017-05-29: 50 ug via INTRAVENOUS

## 2017-05-29 MED ORDER — SODIUM CHLORIDE 0.9 % IV SOLN
INTRAVENOUS | Status: DC
  Administered 2017-05-29: 08:00:00 via INTRAVENOUS

## 2017-05-29 MED ORDER — HEPARIN SOD (PORK) LOCK FLUSH 100 UNIT/ML IV SOLN
INTRAVENOUS | Status: AC
  Filled 2017-05-29: qty 5

## 2017-05-29 MED ORDER — LIDOCAINE HCL (PF) 1 % IJ SOLN
INTRAMUSCULAR | Status: AC | PRN
  Administered 2017-05-29: 10 mL

## 2017-05-29 MED ORDER — MIDAZOLAM HCL 5 MG/5ML IJ SOLN
INTRAMUSCULAR | Status: AC | PRN
  Administered 2017-05-29 (×3): 1 mg via INTRAVENOUS

## 2017-05-29 NOTE — Procedures (Signed)
  Procedure: CT R iliac bone marrow bx   EBL:   minimal Complications:  none immediate  See full dictation in [facility] PACS.  D. Jaylan Hinojosa MD Main # [phone] Pager  [phone]    

## 2017-05-30 DIAGNOSIS — Z7682 Awaiting organ transplant status: Secondary | ICD-10-CM | POA: Insufficient documentation

## 2017-05-31 ENCOUNTER — Encounter: Payer: Self-pay | Admitting: Internal Medicine

## 2017-05-31 ENCOUNTER — Telehealth: Payer: Self-pay | Admitting: Internal Medicine

## 2017-05-31 NOTE — Telephone Encounter (Signed)
I cnl the lab apts on 1/23 and 1/30, but pt needs to keep her md apt on 1/23. I attempted to leave a vm on pt's cell regarding the apt on 1/23

## 2017-05-31 NOTE — Telephone Encounter (Signed)
Spoke with patient. She has a lumbar puncture scheduled for 06/05/17. She also has apts for the planning process for bone marrow transplant on 06/12/17. Per Dr. Rogue Bussing, [facility]. Pt has an apt at [facility] on this day as well, but not until the pm. She will see Dr. Jacinto Reap on 06/04/17 at [place]. Pt gave verbal understanding of the plan of care.

## 2017-06-02 ENCOUNTER — Emergency Department: Payer: BLUE CROSS/BLUE SHIELD

## 2017-06-02 ENCOUNTER — Inpatient Hospital Stay
Admission: EM | Admit: 2017-06-02 | Discharge: 2017-06-04 | DRG: 253 | Disposition: A | Discharge status: Home / Self Care | Payer: BLUE CROSS/BLUE SHIELD | Attending: Internal Medicine | Admitting: Internal Medicine

## 2017-06-02 ENCOUNTER — Other Ambulatory Visit: Payer: Self-pay

## 2017-06-02 ENCOUNTER — Encounter: Payer: Self-pay | Admitting: Emergency Medicine

## 2017-06-02 DIAGNOSIS — Z9221 Personal history of antineoplastic chemotherapy: Secondary | ICD-10-CM | POA: Diagnosis not present

## 2017-06-02 DIAGNOSIS — K5790 Diverticulosis of intestine, part unspecified, without perforation or abscess without bleeding: Secondary | ICD-10-CM | POA: Diagnosis not present

## 2017-06-02 DIAGNOSIS — R52 Pain, unspecified: Secondary | ICD-10-CM

## 2017-06-02 DIAGNOSIS — K210 Gastro-esophageal reflux disease with esophagitis: Secondary | ICD-10-CM | POA: Diagnosis present

## 2017-06-02 DIAGNOSIS — C8590 Non-Hodgkin lymphoma, unspecified, unspecified site: Secondary | ICD-10-CM | POA: Diagnosis not present

## 2017-06-02 DIAGNOSIS — D72829 Elevated white blood cell count, unspecified: Secondary | ICD-10-CM | POA: Diagnosis present

## 2017-06-02 DIAGNOSIS — D696 Thrombocytopenia, unspecified: Secondary | ICD-10-CM | POA: Diagnosis present

## 2017-06-02 DIAGNOSIS — T458X5A Adverse effect of other primarily systemic and hematological agents, initial encounter: Secondary | ICD-10-CM | POA: Diagnosis present

## 2017-06-02 DIAGNOSIS — Z6832 Body mass index (BMI) 32.0-32.9, adult: Secondary | ICD-10-CM | POA: Diagnosis not present

## 2017-06-02 DIAGNOSIS — K219 Gastro-esophageal reflux disease without esophagitis: Secondary | ICD-10-CM | POA: Diagnosis not present

## 2017-06-02 DIAGNOSIS — Z86718 Personal history of other venous thrombosis and embolism: Secondary | ICD-10-CM | POA: Diagnosis not present

## 2017-06-02 DIAGNOSIS — Z800 Family history of malignant neoplasm of digestive organs: Secondary | ICD-10-CM | POA: Diagnosis not present

## 2017-06-02 DIAGNOSIS — Z807 Family history of other malignant neoplasms of lymphoid, hematopoietic and related tissues: Secondary | ICD-10-CM | POA: Diagnosis not present

## 2017-06-02 DIAGNOSIS — D899 Disorder involving the immune mechanism, unspecified: Secondary | ICD-10-CM | POA: Diagnosis present

## 2017-06-02 DIAGNOSIS — R600 Localized edema: Secondary | ICD-10-CM | POA: Diagnosis not present

## 2017-06-02 DIAGNOSIS — M542 Cervicalgia: Secondary | ICD-10-CM | POA: Diagnosis not present

## 2017-06-02 DIAGNOSIS — D638 Anemia in other chronic diseases classified elsewhere: Secondary | ICD-10-CM | POA: Diagnosis present

## 2017-06-02 DIAGNOSIS — R509 Fever, unspecified: Secondary | ICD-10-CM | POA: Diagnosis not present

## 2017-06-02 DIAGNOSIS — E669 Obesity, unspecified: Secondary | ICD-10-CM | POA: Diagnosis present

## 2017-06-02 DIAGNOSIS — C8333 Diffuse large B-cell lymphoma, intra-abdominal lymph nodes: Secondary | ICD-10-CM | POA: Diagnosis present

## 2017-06-02 DIAGNOSIS — M810 Age-related osteoporosis without current pathological fracture: Secondary | ICD-10-CM | POA: Diagnosis present

## 2017-06-02 DIAGNOSIS — C8583 Other specified types of non-Hodgkin lymphoma, intra-abdominal lymph nodes: Secondary | ICD-10-CM | POA: Diagnosis present

## 2017-06-02 DIAGNOSIS — C8338 Diffuse large B-cell lymphoma, lymph nodes of multiple sites: Secondary | ICD-10-CM | POA: Diagnosis not present

## 2017-06-02 DIAGNOSIS — I82C19 Acute embolism and thrombosis of unspecified internal jugular vein: Secondary | ICD-10-CM | POA: Diagnosis present

## 2017-06-02 DIAGNOSIS — Z808 Family history of malignant neoplasm of other organs or systems: Secondary | ICD-10-CM | POA: Diagnosis not present

## 2017-06-02 DIAGNOSIS — C7951 Secondary malignant neoplasm of bone: Secondary | ICD-10-CM | POA: Diagnosis not present

## 2017-06-02 DIAGNOSIS — Z79899 Other long term (current) drug therapy: Secondary | ICD-10-CM | POA: Diagnosis not present

## 2017-06-02 DIAGNOSIS — M1990 Unspecified osteoarthritis, unspecified site: Secondary | ICD-10-CM | POA: Diagnosis not present

## 2017-06-02 DIAGNOSIS — I82C11 Acute embolism and thrombosis of right internal jugular vein: Secondary | ICD-10-CM | POA: Diagnosis not present

## 2017-06-02 DIAGNOSIS — L928 Other granulomatous disorders of the skin and subcutaneous tissue: Secondary | ICD-10-CM | POA: Diagnosis present

## 2017-06-02 DIAGNOSIS — R51 Headache: Secondary | ICD-10-CM | POA: Diagnosis not present

## 2017-06-02 DIAGNOSIS — R1310 Dysphagia, unspecified: Secondary | ICD-10-CM | POA: Diagnosis present

## 2017-06-02 HISTORY — DX: Acute embolism and thrombosis of right internal jugular vein: I82.C11

## 2017-06-02 LAB — CBC WITH DIFFERENTIAL/PLATELET
BASOS PCT: 0 %
Band Neutrophils: 3 %
Basophils Absolute: 0 10*3/uL (ref 0–0.1)
Blasts: 0 %
Eosinophils Absolute: 0.2 10*3/uL (ref 0–0.7)
Eosinophils Relative: 1 %
HEMATOCRIT: 29.4 % — AB (ref 35.0–47.0)
Hemoglobin: 9.5 g/dL — ABNORMAL LOW (ref 12.0–16.0)
LYMPHS ABS: 1.6 10*3/uL (ref 1.0–3.6)
Lymphocytes Relative: 8 %
MCH: 30.2 pg (ref 26.0–34.0)
MCHC: 32.5 g/dL (ref 32.0–36.0)
MCV: 92.9 fL (ref 80.0–100.0)
METAMYELOCYTES PCT: 1 %
MONO ABS: 1.9 10*3/uL — AB (ref 0.2–0.9)
MONOS PCT: 9 %
MYELOCYTES: 2 %
NEUTROS ABS: 16.9 10*3/uL — AB (ref 1.4–6.5)
NEUTROS PCT: 76 %
NRBC: 0 /100{WBCs}
Other: 0 %
PLATELETS: 61 10*3/uL — AB (ref 150–440)
Promyelocytes Absolute: 0 %
RBC: 3.16 MIL/uL — AB (ref 3.80–5.20)
RDW: 24.5 % — AB (ref 11.5–14.5)
WBC: 20.6 10*3/uL — ABNORMAL HIGH (ref 3.6–11.0)

## 2017-06-02 LAB — INFLUENZA PANEL BY PCR (TYPE A & B)
Influenza A By PCR: NEGATIVE
Influenza B By PCR: NEGATIVE

## 2017-06-02 LAB — APTT: APTT: 28 s (ref 24–36)

## 2017-06-02 LAB — COMPREHENSIVE METABOLIC PANEL
ALT: 12 U/L — AB (ref 14–54)
AST: 20 U/L (ref 15–41)
Albumin: 4.2 g/dL (ref 3.5–5.0)
Alkaline Phosphatase: 151 U/L — ABNORMAL HIGH (ref 38–126)
Anion gap: 11 (ref 5–15)
BUN: 8 mg/dL (ref 6–20)
CO2: 23 mmol/L (ref 22–32)
CREATININE: 1.11 mg/dL — AB (ref 0.44–1.00)
Calcium: 9.4 mg/dL (ref 8.9–10.3)
Chloride: 103 mmol/L (ref 101–111)
GFR calc Af Amer: >60 mL/min (ref >60)
GFR calc non Af Amer: 52 mL/min — ABNORMAL LOW (ref >60)
Glucose, Bld: 103 mg/dL — ABNORMAL HIGH (ref 65–99)
POTASSIUM: 3.8 mmol/L (ref 3.5–5.1)
SODIUM: 137 mmol/L (ref 135–145)
Total Bilirubin: 0.7 mg/dL (ref 0.3–1.2)
Total Protein: 6.9 g/dL (ref 6.5–8.1)

## 2017-06-02 LAB — HEPARIN LEVEL (UNFRACTIONATED)
HEPARIN UNFRACTIONATED: 0.68 [IU]/mL (ref 0.30–0.70)
HEPARIN UNFRACTIONATED: 0.58 [IU]/mL (ref 0.30–0.70)

## 2017-06-02 LAB — PROTIME-INR
INR: 0.89
Prothrombin Time: 12 seconds (ref 11.4–15.2)

## 2017-06-02 LAB — LACTIC ACID, PLASMA
LACTIC ACID, VENOUS: 1.1 mmol/L (ref 0.5–1.9)
LACTIC ACID, VENOUS: 2.1 mmol/L — AB (ref 0.5–1.9)

## 2017-06-02 LAB — PROCALCITONIN: Procalcitonin: 0.26 ng/mL

## 2017-06-02 MED ORDER — ONDANSETRON HCL 4 MG PO TABS: 4.0000 mg | ORAL_TABLET | Freq: Four times a day (QID) | ORAL | Status: DC | PRN

## 2017-06-02 MED ORDER — DEXTROSE 5 % IV SOLN
1.5000 g | INTRAVENOUS | Status: AC
  Administered 2017-06-03: 1.5 g via INTRAVENOUS
  Filled 2017-06-02: qty 1.5

## 2017-06-02 MED ORDER — ACETAMINOPHEN 325 MG PO TABS
650.0000 mg | ORAL_TABLET | Freq: Four times a day (QID) | ORAL | Status: DC | PRN
  Administered 2017-06-02 – 2017-06-04 (×5): 650 mg via ORAL
  Filled 2017-06-02 (×5): qty 2

## 2017-06-02 MED ORDER — ACYCLOVIR 400 MG PO TABS: 400.0000 mg | ORAL_TABLET | Freq: Two times a day (BID) | ORAL | Status: DC

## 2017-06-02 MED ORDER — CEFTRIAXONE SODIUM IN DEXTROSE 20 MG/ML IV SOLN
1.0000 g | Freq: Once | INTRAVENOUS | Status: AC
  Administered 2017-06-02: 1 g via INTRAVENOUS
  Filled 2017-06-02: qty 50

## 2017-06-02 MED ORDER — DEXTROSE 5 % IV SOLN
2.0000 g | Freq: Once | INTRAVENOUS | Status: DC
  Filled 2017-06-02: qty 2

## 2017-06-02 MED ORDER — DEXTROSE 5 % IV SOLN
1.0000 g | Freq: Once | INTRAVENOUS | Status: AC
  Administered 2017-06-02: 15:00:00 1 g via INTRAVENOUS
  Filled 2017-06-02: qty 10

## 2017-06-02 MED ORDER — PANTOPRAZOLE SODIUM 40 MG IV SOLR
40.0000 mg | Freq: Two times a day (BID) | INTRAVENOUS | Status: DC
  Administered 2017-06-02 – 2017-06-03 (×3): 40 mg via INTRAVENOUS
  Filled 2017-06-02 (×3): qty 40

## 2017-06-02 MED ORDER — MORPHINE SULFATE (PF) 2 MG/ML IV SOLN: 2.0000 mg | INTRAVENOUS | Status: DC | PRN

## 2017-06-02 MED ORDER — DOCUSATE SODIUM 100 MG PO CAPS
100.0000 mg | ORAL_CAPSULE | Freq: Two times a day (BID) | ORAL | Status: DC
  Administered 2017-06-03 – 2017-06-04 (×2): 100 mg via ORAL
  Filled 2017-06-02 (×4): qty 1

## 2017-06-02 MED ORDER — VANCOMYCIN HCL IN DEXTROSE 1-5 GM/200ML-% IV SOLN
1000.0000 mg | INTRAVENOUS | Status: DC
  Administered 2017-06-03 (×2): 1000 mg via INTRAVENOUS
  Filled 2017-06-02 (×3): qty 200

## 2017-06-02 MED ORDER — ACYCLOVIR 400 MG PO TABS
400.0000 mg | ORAL_TABLET | Freq: Two times a day (BID) | ORAL | Status: DC
  Filled 2017-06-02 (×2): qty 1

## 2017-06-02 MED ORDER — IOPAMIDOL (ISOVUE-300) INJECTION 61%
75.0000 mL | Freq: Once | INTRAVENOUS | Status: AC | PRN
  Administered 2017-06-02: 75 mL via INTRAVENOUS

## 2017-06-02 MED ORDER — VANCOMYCIN HCL 10 G IV SOLR
1500.0000 mg | INTRAVENOUS | Status: AC
  Administered 2017-06-02: 1500 mg via INTRAVENOUS
  Filled 2017-06-02: qty 1500

## 2017-06-02 MED ORDER — SODIUM CHLORIDE 0.9 % IV SOLN
INTRAVENOUS | Status: DC
  Administered 2017-06-02 – 2017-06-03 (×3): via INTRAVENOUS

## 2017-06-02 MED ORDER — SODIUM CHLORIDE 0.9 % IV SOLN: INTRAVENOUS | Status: DC

## 2017-06-02 MED ORDER — LORAZEPAM 0.5 MG PO TABS
0.5000 mg | ORAL_TABLET | Freq: Four times a day (QID) | ORAL | Status: DC | PRN
  Administered 2017-06-03: 21:00:00 0.5 mg via ORAL
  Filled 2017-06-02: qty 1

## 2017-06-02 MED ORDER — ONDANSETRON HCL 4 MG/2ML IJ SOLN: 4.0000 mg | Freq: Four times a day (QID) | INTRAMUSCULAR | Status: DC | PRN

## 2017-06-02 MED ORDER — HEPARIN BOLUS VIA INFUSION
4000.0000 [IU] | Freq: Once | INTRAVENOUS | Status: AC
  Administered 2017-06-02: 4000 [IU] via INTRAVENOUS
  Filled 2017-06-02: qty 4000

## 2017-06-02 MED ORDER — LIFITEGRAST 5 % OP SOLN
1.0000 [drp] | Freq: Every day | OPHTHALMIC | Status: DC
  Administered 2017-06-02 – 2017-06-04 (×2): 1 [drp] via OPHTHALMIC
  Filled 2017-06-02: qty 5

## 2017-06-02 MED ORDER — CALCIUM CARBONATE ANTACID 500 MG PO CHEW
1.0000 | CHEWABLE_TABLET | Freq: Two times a day (BID) | ORAL | Status: DC
  Administered 2017-06-02 – 2017-06-04 (×4): 200 mg via ORAL
  Filled 2017-06-02 (×4): qty 1

## 2017-06-02 MED ORDER — CALCIUM CARBONATE 1250 (500 CA) MG PO TABS
625.0000 mg | ORAL_TABLET | Freq: Two times a day (BID) | ORAL | Status: DC
  Filled 2017-06-02 (×2): qty 0.5

## 2017-06-02 MED ORDER — HEPARIN (PORCINE) IN NACL 100-0.45 UNIT/ML-% IJ SOLN
1150.0000 [IU]/h | INTRAMUSCULAR | Status: DC
  Administered 2017-06-02 – 2017-06-04 (×3): 1150 [IU]/h via INTRAVENOUS
  Filled 2017-06-02 (×3): qty 250

## 2017-06-02 MED ORDER — BISACODYL 10 MG RE SUPP
10.0000 mg | Freq: Every day | RECTAL | Status: DC | PRN
  Filled 2017-06-02: qty 1

## 2017-06-02 MED ORDER — ACETAMINOPHEN 650 MG RE SUPP: 650.0000 mg | Freq: Four times a day (QID) | RECTAL | Status: DC | PRN

## 2017-06-02 MED ORDER — ACYCLOVIR 200 MG PO CAPS
400.0000 mg | ORAL_CAPSULE | Freq: Two times a day (BID) | ORAL | Status: DC
  Administered 2017-06-02 – 2017-06-04 (×4): 400 mg via ORAL
  Filled 2017-06-02 (×6): qty 2

## 2017-06-02 NOTE — ED Triage Notes (Signed)
Pt presents to ED via POV with c/o R sided neck swelling. Pt states has port to R chest. Pt also c/o fever yesterday, max temp at home was 100.8, took Tylenol at approx 0730. Pt states was sent by Dr. Ignatius Specking, her oncologist, for evaluation for blood clot in her neck.

## 2017-06-02 NOTE — Progress Notes (Signed)
ANTICOAGULATION CONSULT NOTE - Initial Consult  Pharmacy Consult for Heparin  Indication: pulmonary embolus  No Known Allergies  Patient Measurements: Height: 5\' 2"  (157.5 cm) Weight: 172 lb (78 kg) IBW/kg (Calculated) : 50.1 Heparin Dosing Weight: 67.2  Vital Signs: Temp: 98.6 F (37 C) (01/20 1325) Temp Source: Oral (01/20 1325) BP: 150/66 (01/20 1325) Pulse Rate: 89 (01/20 1325)  Labs: Recent Labs    06/02/17 0949 06/02/17 1508 06/02/17 1932  HGB 9.5*  --   --   HCT 29.4*  --   --   PLT 61*  --   --   APTT 28  --   --   LABPROT 12.0  --   --   INR 0.89  --   --   HEPARINUNFRC  --  0.68 0.58  CREATININE 1.11*  --   --     Estimated Creatinine Clearance: 50.9 mL/min (A) (by C-G formula based on SCr of 1.11 mg/dL (H)).   Medical History: Past Medical History:  Diagnosis Date  . Arthritis   . Blood dyscrasia   . Cancer ([place])   . Chest pain, unspecified   . Diverticulosis 07/06/15  . Dysrhythmia    tachycardia  . Esophagitis 07/06/15  . Fatigue   . Gastritis 07/06/15  . GERD (gastroesophageal reflux disease)   . 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 Leg cramps   . Lymphoma ([place]) 09/14/15   Monoclonal B cell lymphoma  . Night sweats   . Osteoporosis   . Overweight(278.02)    Obesity  . Palpitations   . Shortness of breath dyspnea   . Tachycardia   . Thrombocytopenia ([place])     Medications:  Medications Prior to Admission  Medication Sig Dispense Refill Last Dose  . acetaminophen (TYLENOL 8 HOUR) 650 MG CR tablet Take 650 mg by mouth every 8 (eight) hours as needed for pain.   PRN at PRN  . acyclovir (ZOVIRAX) 400 MG tablet Take 1 tablet (400 mg total) 2 (two) times daily by mouth. One pill a day [to prevent shingles] 60 tablet 3 06/02/2017 at AM  . Alum Hydroxide-Mag Carbonate (GAVISCON PO) Take by mouth.   PRN at PRN  . CALCIUM PO Take 600 mg 2 (two) times daily by mouth.    06/02/2017 at AM  . Javier Docker Oil 1000 MG CAPS Take 1 capsule by mouth daily.     06/02/2017 at AM  . lidocaine-prilocaine (EMLA) cream Apply to affected area once 30 g 3 PRN at PRN  . LORazepam (ATIVAN) 0.5 MG tablet Take 1 tablet (0.5 mg total) by mouth every 8 (eight) hours. 30 tablet 0 PRN at PRN  . ondansetron (ZOFRAN) 8 MG tablet Take 1 tablet (8 mg total) 2 (two) times daily as needed by mouth. Start on the 3rd day after cisplatin chemotherapy. 30 tablet 1 PRN at PRN  . ranitidine (ZANTAC) 150 MG tablet Take 150 mg by mouth 2 (two) times daily.   06/02/2017 at AM  . Shirley Friar 5 % SOLN 1 drop.   4 06/02/2017 at AM  . dexamethasone (DECADRON) 4 MG tablet Take 10 tablets (40 mg total) by mouth daily. Start the day after cisplatin chemotherapy for 4 days. (Patient not taking: Reported on 05/29/2017) 40 tablet 3 -- at --  . prochlorperazine (COMPAZINE) 10 MG tablet Take 1 tablet (10 mg total) every 6 (six) hours as needed by mouth (Nausea or vomiting). (Patient not taking: Reported on 05/29/2017) 30 tablet 1 -- at --  . promethazine (  PHENERGAN) 25 MG tablet Take 1 tablet (25 mg total) by mouth every 8 (eight) hours as needed for nausea or vomiting. (Patient not taking: Reported on 05/29/2017) 30 tablet 0 -- at --   Scheduled:  . acyclovir  400 mg Oral BID  . calcium carbonate  1 tablet Oral BID  . docusate sodium  100 mg Oral BID  . Lifitegrast  1 drop Both Eyes Daily  . pantoprazole (PROTONIX) IV  40 mg Intravenous Q12H    Assessment: Pharmacy consulted to dose and monitor heparin in this 62 year old woman being treated for pulmonary embolism  Goal of Therapy:  Heparin level 0.3-0.7 units/ml Monitor platelets by anticoagulation protocol: Yes   Plan:  Give 4000 units bolus x 1 Start heparin infusion at 1150 units/hr Check anti-Xa level in 6 hours and daily while on heparin Continue to monitor H&H and platelets   1/20:  HL @ 15:08 = 0.68.   HL drawn only 2 1/2 hrs after start of drip so not a valid level .   Reordered level for 19:30 , which was 7 hrs after start.  1/20:   HL @ 19:30 = 0.58.  Will continue this pt on current rate and draw confirmation level on 1/21 @ 0130.   Myles Tavella D 06/02/2017,8:23 PM

## 2017-06-02 NOTE — ED Notes (Signed)
Pt states last chemo was 2-3 weeks ago, states was receiving chemo for Large B cell Non-hodgkin's lymphoma, pt is also now complaining that her voice is more raspy than normal.

## 2017-06-02 NOTE — ED Provider Notes (Signed)
[facility] Emergency Department Provider Note  ____________________________________________   None    (approximate)  I have reviewed the triage vital signs and the nursing notes.   HISTORY  Chief Complaint Neck Pain   HPI Stacy Murillo is a 62 y.o. female who self presents to the emergency department with 3-4 days of insidious onset gradually progressive now moderate to severe painful swelling to the right side of her neck.  She is a past medical history of large B cell non-Hodgkin's lymphoma and is currently 2-3 weeks status post her last chemotherapy.  She has a port on the right.  This morning she noted a temperature of 100.8 degrees at home for which she took Tylenol and then came to the hospital.  She called her oncologist who advised her to come in and be evaluated for a "blood clot" in her right neck.  Past Medical History:  Diagnosis Date  . Arthritis   . Blood dyscrasia   . Cancer ([place])   . Chest pain, unspecified   . Diverticulosis 07/06/15  . Dysrhythmia    tachycardia  . Esophagitis 07/06/15  . Fatigue   . Gastritis 07/06/15  . GERD (gastroesophageal reflux disease)   . 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 Leg cramps   . Lymphoma ([place]) 09/14/15   Monoclonal B cell lymphoma  . Night sweats   . Osteoporosis   . Overweight(278.02)    Obesity  . Palpitations   . Shortness of breath dyspnea   . Tachycardia   . Thrombocytopenia [facility])     Patient Active Problem List   Diagnosis Date Noted  . 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 Fever 06/02/2017  . Neck pain on right side 06/02/2017  . Jugular vein occlusion ([place]) 06/02/2017  . Diverticulitis large intestine w/o perforation or abscess w/bleeding 02/17/2017  . Diffuse large B-cell lymphoma of lymph nodes of multiple regions ([place]) 05/25/2016  . Elevated LFTs 10/27/2015  . Deep vein thrombosis (DVT) of lower extremity ([place]) 10/27/2015  . Generalized headaches 10/27/2015  .  Muscle cramps 10/27/2015  . Oral mucositis due to antineoplastic therapy 10/14/2015  . 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[place]) 09/21/2015  . Palpitation 09/14/2015  . SOB (shortness of breath) on exertion 09/14/2015  . Palpitations 08/24/2015  . Left knee pain 07/20/2014  . Achilles tendonitis 07/20/2014  . Esophageal reflux 04/06/2014  . Special screening for malignant neoplasms, colon 04/06/2014  . Routine general medical examination at a health care facility 07/17/2012  . Screening for breast cancer 07/10/2011  . Osteopenia 07/06/2011  . Vitamin D deficiency 07/06/2011  . Hyperlipidemia 07/06/2011  . Disorder of bone and cartilage 07/06/2011  . Osteoporosis   . Overweight 10/19/2008    Past Surgical History:  Procedure Laterality Date  . ABDOMINAL HYSTERECTOMY  2005  . BONE MARROW BIOPSY  09/14/15  . CHOLECYSTECTOMY  1997  . COLONOSCOPY  07/05/2014  . ESOPHAGOGASTRODUODENOSCOPY  07/05/2014  . INGUINAL LYMPH NODE BIOPSY Right 10/05/2015   Procedure: INGUINAL LYMPH NODE BIOPSY;  Surgeon: Christene Lye, MD;  Location: [facility] ORS;  Service: General;  Laterality: Right;  . PERIPHERAL VASCULAR CATHETERIZATION N/A 09/27/2015   Procedure: Glori Luis Cath Insertion;  Surgeon: Algernon Huxley, MD;  Location: [place] [facility];  Service: Cardiovascular;  Laterality: N/A;  . PORTACATH PLACEMENT Right     Prior to Admission medications   Medication Sig Start Date End Date Taking? Authorizing Provider  acetaminophen (TYLENOL 8 HOUR) 650 MG  CR tablet Take 650 mg by mouth every 8 (eight) hours as needed for pain.   Yes [provider]  acyclovir (ZOVIRAX) 400 MG tablet Take 1 tablet (400 mg total) 2 (two) times daily by mouth. One pill a day [to prevent shingles] 03/26/17  Yes Cammie Sickle, MD  Alum Hydroxide-Mag Carbonate (GAVISCON PO) Take by mouth.   Yes [provider]  CALCIUM PO Take 600 mg 2 (two) times daily by mouth.    Yes [provider]  Javier Docker Oil 1000 MG CAPS Take 1 capsule by mouth daily.    Yes [provider]  lidocaine-prilocaine (EMLA) cream Apply to affected area once 03/26/17  Yes Brahmanday, Elisha [place], MD  LORazepam (ATIVAN) 0.5 MG tablet Take 1 tablet (0.5 mg total) by mouth every 8 (eight) hours. 04/24/17  Yes Cammie Sickle, MD  ondansetron (ZOFRAN) 8 MG tablet Take 1 tablet (8 mg total) 2 (two) times daily as needed by mouth. Start on the 3rd day after cisplatin chemotherapy. 03/26/17  Yes Cammie Sickle, MD  ranitidine (ZANTAC) 150 MG tablet Take 150 mg by mouth 2 (two) times daily.   Yes [provider]  XIIDRA 5 % SOLN 1 drop.  01/24/17  Yes [provider]  dexamethasone (DECADRON) 4 MG tablet Take 10 tablets (40 mg total) by mouth daily. Start the day after cisplatin chemotherapy for 4 days. Patient not taking: Reported on 05/29/2017 04/08/17   Cammie Sickle, MD  prochlorperazine (COMPAZINE) 10 MG tablet Take 1 tablet (10 mg total) every 6 (six) hours as needed by mouth (Nausea or vomiting). Patient not taking: Reported on 05/29/2017 03/26/17   Cammie Sickle, MD  promethazine (PHENERGAN) 25 MG tablet Take 1 tablet (25 mg total) by mouth every 8 (eight) hours as needed for nausea or vomiting. Patient not taking: Reported on 05/29/2017 04/24/17   Cammie Sickle, MD    Allergies Patient has no known allergies.  Family History  Problem Relation Age of Onset  . Heart disease Father        CABG x 4  . Multiple myeloma Father   . Hypertension Mother   . Pancreatic cancer Mother   . Ulcers Mother   . Asthma Mother   . Brain cancer Maternal Uncle   . Multiple myeloma Maternal Uncle   . Diabetes Neg Hx   . Breast cancer Neg Hx     Social History Social History   Tobacco Use  . Smoking status: Never Smoker  . Smokeless tobacco: Never Used  Substance Use Topics  . Alcohol use: No    Alcohol/week: 0.0 oz  . Drug use: No     Review of Systems Constitutional: Positive for fever Eyes: No visual changes. ENT: Positive for sore throat. Cardiovascular: Denies chest pain. Respiratory: Denies shortness of breath. Gastrointestinal: No abdominal pain.  No nausea, no vomiting.  No diarrhea.  No constipation. Genitourinary: Negative for dysuria. Musculoskeletal: Negative for back pain. Skin: Negative for rash. Neurological: Negative for headaches, focal weakness or numbness.   ____________________________________________   PHYSICAL EXAM:  VITAL SIGNS: ED Triage Vitals  Enc Vitals Group     BP 06/02/17 0926 132/76     Pulse Rate 06/02/17 0926 97     Resp 06/02/17 0926 14     Temp 06/02/17 0926 98.6 F (37 C)     Temp Source 06/02/17 0926 Oral     SpO2 06/02/17 0926 98 %     Weight 06/02/17  0927 172 lb (78 kg)     Height 06/02/17 0927 5' 2"  (1.575 m)     Head Circumference --      Peak Flow --      Pain Score 06/02/17 0925 8     Pain Loc --      Pain Edu? --      Excl. in [place]? --     Constitutional: Alert and oriented x4 appears somewhat anxious no diaphoresis Eyes: PERRL EOMI. Head: Atraumatic. Nose: No congestion/rhinnorhea. Mouth/Throat: No trismus uvula midline no pharyngeal erythema or exudate Neck: No meningismus.  She is tender right anterior cervical and posterior cervical chain Cardiovascular: Normal rate, regular rhythm. Grossly normal heart sounds.  Good peripheral circulation. Respiratory: Normal respiratory effort.  No retractions. Lungs CTAB and moving good air Gastrointestinal: Soft nontender Musculoskeletal: No lower extremity edema   Neurologic:  Normal speech and language. No gross focal neurologic deficits are appreciated. Skin:  Skin is warm, dry and intact. No rash noted. Psychiatric: Mood and affect are normal. Speech and behavior are normal.    ____________________________________________   DIFFERENTIAL includes but not limited to  Internal jugular thrombosis,  septic thrombophlebitis, retropharyngeal abscess, neutropenic fever ____________________________________________   LABS (all labs ordered are listed, but only abnormal results are displayed)  Labs Reviewed  LACTIC ACID, PLASMA - Abnormal; Notable for the following components:      Result Value   Lactic Acid, Venous 2.1 (*)    All other components within normal limits  COMPREHENSIVE METABOLIC PANEL - Abnormal; Notable for the following components:   Glucose, Bld 103 (*)    Creatinine, Ser 1.11 (*)    ALT 12 (*)    Alkaline Phosphatase 151 (*)    GFR calc non Af Amer 52 (*)    All other components within normal limits  CBC WITH DIFFERENTIAL/PLATELET - Abnormal; Notable for the following components:   WBC 20.6 (*)    RBC 3.16 (*)    Hemoglobin 9.5 (*)    HCT 29.4 (*)    RDW 24.5 (*)    Platelets 61 (*)    Neutro Abs 16.9 (*)    Monocytes Absolute 1.9 (*)    All other components within normal limits  CULTURE, BLOOD (ROUTINE X 2)  CULTURE, BLOOD (ROUTINE X 2)  LACTIC ACID, PLASMA  PROCALCITONIN  INFLUENZA PANEL BY PCR (TYPE A & B)  PROTIME-INR  APTT  HEPARIN LEVEL (UNFRACTIONATED)  HIV ANTIBODY (ROUTINE TESTING)    Lab work reviewed by me shows elevated white count slightly elevated lactic acid which are concerning for infection __________________________________________  EKG   ____________________________________________  RADIOLOGY  CT of the neck reviewed by me shows large clot in the right IJ ____________________________________________   PROCEDURES  Procedure(s) performed: no  Procedures  Critical Care performed: no  Observation: no ____________________________________________   INITIAL IMPRESSION / ASSESSMENT AND PLAN / ED COURSE  Pertinent labs & imaging results that were available during my care of the patient were reviewed by me and considered in my medical decision making (see chart for  details).  ----------------------------------------- 9:44 AM on 06/02/2017 -----------------------------------------  I discussed with the patient's oncologist Dr. Rogue Bussing who recommended an ultrasound of the right arm and right neck.  He indicated that the patient's cisplatin puts her at high risk for blood clot.    ----------------------------------------- 11:19 AM on 06/02/2017 -----------------------------------------  Was called by radiology of the patient's CT scan is positive for a thrombosis in her right IJ.  No suggestion  of Lemiere's syndrome. ____________________________________________   ----------------------------------------- 11:30 AM on 06/02/2017 -----------------------------------------  I was called by radiology that the patient has an extensive right internal jugular thrombosis.  I then spoke with Dr. Rogue Bussing regarding the CT and he recommends IV heparin and inpatient admission given the extensive nature of the clot.  He also recommends vascular consultation for removal of the port and replacement.  ----------------------------------------- 11:35 AM on 06/02/2017 -----------------------------------------  I spoke with vascular surgeon Dr. do who will kindly consult on the patient.  I then discussed with the hospitalist Dr. Doy Hutching who is graciously agreed to admit the patient to his service.  I appreciate that the patient's clot could very well explain her low-grade fever, however she is a cancer patient who is febrile and I think is reasonable to give her coverage with ceftriaxone and Flagyl until cultures come back negative.  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     Darel Hong, MD 06/02/17 1538

## 2017-06-02 NOTE — Progress Notes (Signed)
Notified Dr. Doy Hutching of lactic acid level of 2.1. No new orders.

## 2017-06-02 NOTE — H&P (Signed)
History and Physical    Stacy Murillo HXT:[id number] DOB: [birth year] DOA: 06/02/2017  Referring physician: Dr. Mable Paris PCP: Crecencio Mc, MD  Specialists: none  Chief Complaint: neck pain with fever  HPI: Stacy Murillo is a 62 y.o. female has a past medical history significant for B-cell lymphoma with right-sided port and recent chemo now with 2-3 day hx of progressive right neck pain with fever and swelling. In ER, pt is febrile. WBC up slightly. US shows occlusion fo right IJ with clot. She is now admitted. No N/V/D. Denies CP or SOB.  Review of Systems: The patient denies anorexia, weight loss,, vision loss, decreased hearing, hoarseness, chest pain, syncope, dyspnea on exertion, peripheral edema, balance deficits, hemoptysis, abdominal pain, melena, hematochezia, severe indigestion/heartburn, hematuria, incontinence, genital sores, muscle weakness, suspicious skin lesions, transient blindness, difficulty walking, depression, unusual weight change, abnormal bleeding, enlarged lymph nodes, angioedema, and breast masses.   Past Medical History:  Diagnosis Date  . Arthritis   . Blood dyscrasia   . Cancer ([place])   . Chest pain, unspecified   . Diverticulosis 07/06/15  . Dysrhythmia    tachycardia  . Esophagitis 07/06/15  . Fatigue   . Gastritis 07/06/15  . GERD (gastroesophageal reflux disease)   . 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 Leg cramps   . Lymphoma ([place]) 09/14/15   Monoclonal B cell lymphoma  . Night sweats   . Osteoporosis   . Overweight(278.02)    Obesity  . Palpitations   . Shortness of breath dyspnea   . Tachycardia   . Thrombocytopenia ([place])    Past Surgical History:  Procedure Laterality Date  . ABDOMINAL HYSTERECTOMY  2005  . BONE MARROW BIOPSY  09/14/15  . CHOLECYSTECTOMY  1997  . COLONOSCOPY  07/05/2014  . ESOPHAGOGASTRODUODENOSCOPY  07/05/2014  . INGUINAL LYMPH NODE BIOPSY Right 10/05/2015   Procedure: INGUINAL LYMPH NODE BIOPSY;  Surgeon: Christene Lye, MD;  Location: [facility];  Service: General;  Laterality: Right;  . PERIPHERAL VASCULAR CATHETERIZATION N/A 09/27/2015   Procedure: Glori Luis Cath Insertion;  Surgeon: Algernon Huxley, MD;  Location: [facility];  Service: Cardiovascular;  Laterality: N/A;  . PORTACATH PLACEMENT Right    Social History:  reports that  has never smoked. she has never used smokeless tobacco. She reports that she does not drink alcohol or use drugs.  No Known Allergies  Family History  Problem Relation Age of Onset  . Heart disease Father        CABG x 4  . Multiple myeloma Father   . Hypertension Mother   . Pancreatic cancer Mother   . Ulcers Mother   . Asthma Mother   . Brain cancer Maternal Uncle   . Multiple myeloma Maternal Uncle   . Diabetes Neg Hx   . Breast cancer Neg Hx     Prior to Admission medications   Medication Sig Start Date End Date Taking? Authorizing Provider  acetaminophen (TYLENOL 8 HOUR) 650 MG CR tablet Take 650 mg by mouth every 8 (eight) hours as needed for pain.    [provider]  acyclovir (ZOVIRAX) 400 MG tablet Take 1 tablet (400 mg total) 2 (two) times daily by mouth. One pill a day [to prevent shingles] 03/26/17   Cammie Sickle, MD  Alum Hydroxide-Mag Carbonate (GAVISCON PO) Take by mouth.    [provider]  CALCIUM PO Take 600 mg 2 (two) times daily by mouth.     [provider]  dexamethasone (DECADRON) 4 MG tablet Take 10 tablets (40 mg total) by mouth daily. Start the day after cisplatin chemotherapy for 4 days. Patient not taking: Reported on 05/29/2017 04/08/17   Cammie Sickle, MD  dexamethasone (DECADRON) 4 MG tablet One tablet a day x 4 days after finishing your dex 40 mg/day. 05/15/17   Cammie Sickle, MD  Krill Oil 1000 MG CAPS Take by mouth.    [provider]  lidocaine-prilocaine (EMLA) cream Apply to affected area once 03/26/17   Cammie Sickle, MD  LORazepam (ATIVAN) 0.5 MG tablet Take  1 tablet (0.5 mg total) by mouth every 8 (eight) hours. 04/24/17   Cammie Sickle, MD  ondansetron (ZOFRAN) 8 MG tablet Take 1 tablet (8 mg total) 2 (two) times daily as needed by mouth. Start on the 3rd day after cisplatin chemotherapy. 03/26/17   Cammie Sickle, MD  prochlorperazine (COMPAZINE) 10 MG tablet Take 1 tablet (10 mg total) every 6 (six) hours as needed by mouth (Nausea or vomiting). Patient not taking: Reported on 05/29/2017 03/26/17   Cammie Sickle, MD  promethazine (PHENERGAN) 25 MG tablet Take 1 tablet (25 mg total) by mouth every 8 (eight) hours as needed for nausea or vomiting. Patient not taking: Reported on 05/29/2017 04/24/17   Cammie Sickle, MD  ranitidine (ZANTAC) 150 MG tablet Take 150 mg by mouth 2 (two) times daily.    [provider]  XIIDRA 5 % SOLN 1 drop.  01/24/17   [provider]   Physical Exam: Vitals:   06/02/17 0926 06/02/17 0927  BP: 132/76   Pulse: 97   Resp: 14   Temp: 98.6 F (37 C)   TempSrc: Oral   SpO2: 98%   Weight:  78 kg (172 lb)  Height:  5' 2"  (1.575 m)     General:  No apparent distress, WDWN, [place]/AT  Eyes: PERRL, EOMI, no scleral icterus, conjunctiva clear  ENT: moist oropharynx without exudate, TM's benign, dentition good  Neck: supple, cervical lymphadenopathy noted. No bruits or thyromegaly  Cardiovascular: regular rate without MRG; 2+ peripheral pulses, no JVD, no peripheral edema  Respiratory: CTA biL, good air movement without wheezing, rhonchi or crackled. Respiratory effort normal  Abdomen: soft, non tender to palpation, positive bowel sounds, no guarding, no rebound  Skin: no rashes or lesions. Right neck swelling noted  Musculoskeletal: normal bulk and tone, no joint swelling  Psychiatric: normal mood and affect, A&OX3  Neurologic: CN 2-12 grossly intact, Motor strength 5/5 in all 4 groups with symmetric DTR's and non-focal sensory exam  Labs on Admission:  Basic  Metabolic Panel: Recent Labs  Lab 06/02/17 0949  NA 137  K 3.8  CL 103  CO2 23  GLUCOSE 103*  BUN 8  CREATININE 1.11*  CALCIUM 9.4   Liver Function Tests: Recent Labs  Lab 06/02/17 0949  AST 20  ALT 12*  ALKPHOS 151*  BILITOT 0.7  PROT 6.9  ALBUMIN 4.2   No results for input(s): LIPASE, AMYLASE in the last 168 hours. No results for input(s): AMMONIA in the last 168 hours. CBC: Recent Labs  Lab 05/29/17 0731 06/02/17 0949  WBC 10.3 20.6*  NEUTROABS 8.6* 16.9*  HGB 9.3* 9.5*  HCT 27.4* 29.4*  MCV 91.1 92.9  PLT 33* 61*   Cardiac Enzymes: No results for input(s): CKTOTAL, CKMB, CKMBINDEX, TROPONINI in the last 168 hours.  BNP (last 3 results) No results for input(s): BNP in the last 8760  hours.  ProBNP (last 3 results) No results for input(s): PROBNP in the last 8760 hours.  CBG: No results for input(s): GLUCAP in the last 168 hours.  Radiological Exams on Admission: Dg Chest 2 View  Result Date: 06/02/2017 CLINICAL DATA:  Right-sided swelling in the neck.  Fever. EXAM: CHEST  2 VIEW COMPARISON:  Chest CT 12/05/2016 FINDINGS: Right jugular port is stable with the tip in the upper SVC. Both lungs are clear. Heart and mediastinum are within normal limits. Trachea is midline. No pleural effusions. No acute bone abnormality. IMPRESSION: No active cardiopulmonary disease. Stable appearance of the right chest Port-A-Cath. Electronically Signed   By: Markus Daft M.D.   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 Right side chest porta cath with new onset right neck swelling, low-grade fever. EXAM: CT NECK WITH CONTRAST TECHNIQUE: Multidetector CT imaging of the neck was performed using the standard protocol following the bolus administration of intravenous contrast. CONTRAST:  60m ISOVUE-300 IOPAMIDOL (ISOVUE-300) INJECTION 61% COMPARISON:  PET-CT 05/09/2017 and  earlier. FINDINGS: Pharynx and larynx: Mild to moderate retropharyngeal space effusion. Mild-to-moderate right parapharyngeal space inflammatory stranding. Negative left parapharyngeal space. Mild generalized edema in the mucosal space of the hypopharynx and larynx. Mild rightward mass effect on the lower pharynx and larynx. The nasopharynx and soft palate appear stable and within normal limits. Salivary glands: Inflammatory stranding in the right submandibular space. The right submandibular gland appears to remain normal. Negative sublingual space and left submandibular space. Both parotid glands remain normal. Thyroid: Negative. Lymph nodes: Small hyperenhancing reactive appearing lymph nodes at the right level 2 and level 3 nodal stations. No enlarged or suspicious cervical lymph nodes. Vascular: Thrombosis of the right internal jugular vein. The right subclavian, right innominate vein and visible SVC remain patent, along with left innominate vein, but there is thrombosis of the right IJ from the angle of the mandible caudally to its confluence with the right subclavian vein. This is a segment of about 7 centimeters. New line right IJ approach porta cath is in place. There is no thrombus along the catheter below the level of right IJ clot. Beginning at the angle of the mandible where a retromandibular vein communicates with the right IJ vessel is patent, and patent to the skull base including the right IJ bulb and visible right sigmoid sinus. There is associated inflammatory stranding in an around the right carotid space, tracking to the areas stated above. The other major vascular structures in the neck and at the skull base are patent the left IJ, left subclavian vein, and left innominate vein are patent. Limited intracranial: Negative. Visualized orbits: Negative. Mastoids and visualized paranasal sinuses: Stable and well pneumatized. Skeleton: No acute or suspicious osseous lesion identified. Upper chest:  Negative superior mediastinum. Negative visible lung parenchyma. No axillary lymphadenopathy identified. IMPRESSION: 1. Positive for right IJ thrombosis. A segment of about 7 cm of the vessel is thrombosed from the right thoracic inlet to the angle of the mandible. Associated inflammation in and around the right carotid space (see #3), and mild reactive right cervical lymphadenopathy. 2. The SVC, both innominate veins, and right subclavian vein remain patent. And in the upper neck and at the skull base the right IJ remains patent. The visible right sigmoid sinus is patent. Other major vascular structures in the mediastinum, neck, and at the skull base are patent. 3. Mild associated mass  effect on the right pharynx with right parapharyngeal space inflammation and mild retropharyngeal space effusion. Critical Value/emergent results were called by telephone at the time of interpretation on 06/02/2017 at 1115 hours to Dr. Darel Hong , who verbally acknowledged these results. Electronically Signed   By: Genevie Ann M.D.   On: 06/02/2017 11:19   US Venous Img Upper Uni Right  Result Date: 06/02/2017 CLINICAL DATA:  Diffuse large B-cell lymphoma. New onset of right neck swelling. EXAM: RIGHT UPPER EXTREMITY VENOUS DOPPLER ULTRASOUND TECHNIQUE: Gray-scale sonography with graded compression, as well as color Doppler and duplex ultrasound were performed to evaluate the upper extremity deep venous system from the level of the subclavian vein and including the jugular, axillary, basilic, radial, ulnar and upper cephalic vein. Spectral Doppler was utilized to evaluate flow at rest and with distal augmentation maneuvers. COMPARISON:  Neck CT 06/02/2017 FINDINGS: Contralateral Subclavian Vein: No thrombus. Color Doppler flow and normal phasicity. Internal Jugular Vein: Echogenic thrombus in the right internal jugular vein without compressibility. Expansion of the right internal jugular vein suggesting acute or subacute  thrombus. Port catheter not imaged on this examination. This right internal jugular vein thrombus is occlusive. Subclavian Vein: No evidence of thrombus. Normal compressibility and color Doppler flow in the right subclavian vein. Axillary Vein: No evidence of thrombus. Normal compressibility, respiratory phasicity and response to augmentation. Cephalic Vein: No evidence of thrombus. Normal compressibility, respiratory phasicity and response to augmentation. Basilic Vein: No evidence of thrombus. Normal compressibility, respiratory phasicity and response to augmentation. Brachial Veins: No evidence of thrombus. Normal compressibility, respiratory phasicity and response to augmentation. Radial Veins: No evidence of thrombus. Normal compressibility and color Doppler flow. Ulnar Veins: No evidence of thrombus. Normal compressibility and color Doppler flow. Other Findings:  None visualized. IMPRESSION: Positive for occlusive thrombus in the right internal jugular vein. No other thrombus in the right upper extremity. Electronically Signed   By: Markus Daft M.D.   On: 06/02/2017 11:52    EKG: Independently reviewed.  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 Will begin empiric IV ABX. Consult Vascular and Oncology. Repeat labs in AM  Diet: soft Fluids: NS@75  DVT Prophylaxis: IV Heparin  Code Status: FULL  Family Communication: yes  Disposition Plan: home  Time spent: 50 min

## 2017-06-02 NOTE — Progress Notes (Signed)
ANTIBIOTIC CONSULT NOTE - INITIAL  Pharmacy Consult for Vancomycin  Indication: bacteremia   No Known Allergies  Patient Measurements: Height: 5\' 2"  (157.5 cm) Weight: 172 lb (78 kg) IBW/kg (Calculated) : 50.1 Adjusted Body Weight: 61.2  Vital Signs: Temp: 98.6 F (37 C) (01/20 0926) Temp Source: Oral (01/20 0926) BP: 132/76 (01/20 0926) Pulse Rate: 97 (01/20 0926) Intake/Output from previous day: No intake/output data recorded. Intake/Output from this shift: No intake/output data recorded.  Labs: Recent Labs    06/02/17 0949  WBC 20.6*  HGB 9.5*  PLT 61*  CREATININE 1.11*   Estimated Creatinine Clearance: 50.9 mL/min (A) (by C-G formula based on SCr of 1.11 mg/dL (H)). No results for input(s): VANCOTROUGH, VANCOPEAK, VANCORANDOM, GENTTROUGH, GENTPEAK, GENTRANDOM, TOBRATROUGH, TOBRAPEAK, TOBRARND, AMIKACINPEAK, AMIKACINTROU, AMIKACIN in the last 72 hours.   Microbiology: Recent Results (from the past 720 hour(s))  Urine culture     Status: Abnormal   Collection Time: 05/08/17 11:10 AM  Result Value Ref Range Status   Specimen Description   Final    URINE, CLEAN CATCH Performed at [facility], [address], [place], [place] [address]    Special Requests SAMPLE FROM CUP  Final   Culture (A)  Final    <10,000 COLONIES/mL INSIGNIFICANT GROWTH Performed at [facility], [place] [address], [place], [place] [address]    Report Status 05/11/2017 FINAL  Final    Medical History: Past Medical History:  Diagnosis Date  . Arthritis   . Blood dyscrasia   . Cancer ([place])   . Chest pain, unspecified   . Diverticulosis 07/06/15  . Dysrhythmia    tachycardia  . Esophagitis 07/06/15  . Fatigue   . Gastritis 07/06/15  . GERD (gastroesophageal reflux disease)   . 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 Leg cramps   . Lymphoma ([place]) 09/14/15   Monoclonal B cell lymphoma  . Night sweats   . Osteoporosis   . Overweight(278.02)    Obesity  . Palpitations   .  Shortness of breath dyspnea   . Tachycardia   . Thrombocytopenia (HCC)     Medications:   (Not in a hospital admission) Scheduled:  . heparin  4,000 Units Intravenous Once   Assessment: Pharmacy consulted to dose and monitor Vancomycin in this 62 year old woman for bacteremia   Goal of Therapy:  Vancomycin trough level 15-20 mcg/ml  Plan:  Will give Vancomycin 1500 mg IV x 1 and will then start Vancomycin 1 g IV q18 hours as maintenance dose. Trough level 1/24 @ 0430.    Kolsen Choe D 06/02/2017,11:41 AM

## 2017-06-02 NOTE — Progress Notes (Signed)
ANTICOAGULATION CONSULT NOTE - Initial Consult  Pharmacy Consult for Heparin  Indication: pulmonary embolus  No Known Allergies  Patient Measurements: Height: 5\' 2"  (157.5 cm) Weight: 172 lb (78 kg) IBW/kg (Calculated) : 50.1 Heparin Dosing Weight: 67.2  Vital Signs: Temp: 98.6 F (37 C) (01/20 0926) Temp Source: Oral (01/20 0926) BP: 132/76 (01/20 0926) Pulse Rate: 97 (01/20 0926)  Labs: Recent Labs    06/02/17 0949  HGB 9.5*  HCT 29.4*  PLT 61*  CREATININE 1.11*    Estimated Creatinine Clearance: 50.9 mL/min (A) (by C-G formula based on SCr of 1.11 mg/dL (H)).   Medical History: Past Medical History:  Diagnosis Date  . Arthritis   . Blood dyscrasia   . Cancer ([place])   . Chest pain, unspecified   . Diverticulosis 07/06/15  . Dysrhythmia    tachycardia  . Esophagitis 07/06/15  . Fatigue   . Gastritis 07/06/15  . GERD (gastroesophageal reflux disease)   . 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 Leg cramps   . Lymphoma ([place]) 09/14/15   Monoclonal B cell lymphoma  . Night sweats   . Osteoporosis   . Overweight(278.02)    Obesity  . Palpitations   . Shortness of breath dyspnea   . Tachycardia   . Thrombocytopenia (HCC)     Medications:   (Not in a hospital admission) Scheduled:  . heparin  4,000 Units Intravenous Once    Assessment: Pharmacy consulted to dose and monitor heparin in this 62 year old woman being treated for pulmonary embolism  Goal of Therapy:  Heparin level 0.3-0.7 units/ml Monitor platelets by anticoagulation protocol: Yes   Plan:  Give 4000 units bolus x 1 Start heparin infusion at 1150 units/hr Check anti-Xa level in 6 hours and daily while on heparin Continue to monitor H&H and platelets  Tinnie Kunin D 06/02/2017,11:46 AM

## 2017-06-03 ENCOUNTER — Encounter
Admission: EM | Disposition: A | Discharge status: Home / Self Care | Payer: Self-pay | Source: Home / Self Care | Attending: Internal Medicine

## 2017-06-03 ENCOUNTER — Encounter: Payer: Self-pay | Admitting: Emergency Medicine

## 2017-06-03 DIAGNOSIS — M542 Cervicalgia: Secondary | ICD-10-CM

## 2017-06-03 DIAGNOSIS — R509 Fever, unspecified: Secondary | ICD-10-CM

## 2017-06-03 DIAGNOSIS — R1310 Dysphagia, unspecified: Secondary | ICD-10-CM

## 2017-06-03 DIAGNOSIS — M1990 Unspecified osteoarthritis, unspecified site: Secondary | ICD-10-CM

## 2017-06-03 DIAGNOSIS — Z9221 Personal history of antineoplastic chemotherapy: Secondary | ICD-10-CM

## 2017-06-03 DIAGNOSIS — C8338 Diffuse large B-cell lymphoma, lymph nodes of multiple sites: Secondary | ICD-10-CM

## 2017-06-03 DIAGNOSIS — K219 Gastro-esophageal reflux disease without esophagitis: Secondary | ICD-10-CM

## 2017-06-03 DIAGNOSIS — C8590 Non-Hodgkin lymphoma, unspecified, unspecified site: Secondary | ICD-10-CM

## 2017-06-03 DIAGNOSIS — R600 Localized edema: Secondary | ICD-10-CM

## 2017-06-03 DIAGNOSIS — Z79899 Other long term (current) drug therapy: Secondary | ICD-10-CM

## 2017-06-03 DIAGNOSIS — C7951 Secondary malignant neoplasm of bone: Secondary | ICD-10-CM

## 2017-06-03 DIAGNOSIS — M810 Age-related osteoporosis without current pathological fracture: Secondary | ICD-10-CM

## 2017-06-03 DIAGNOSIS — Z86718 Personal history of other venous thrombosis and embolism: Secondary | ICD-10-CM

## 2017-06-03 DIAGNOSIS — Z7901 Long term (current) use of anticoagulants: Secondary | ICD-10-CM

## 2017-06-03 DIAGNOSIS — R51 Headache: Secondary | ICD-10-CM

## 2017-06-03 DIAGNOSIS — K5790 Diverticulosis of intestine, part unspecified, without perforation or abscess without bleeding: Secondary | ICD-10-CM

## 2017-06-03 DIAGNOSIS — E669 Obesity, unspecified: Secondary | ICD-10-CM

## 2017-06-03 DIAGNOSIS — I82C11 Acute embolism and thrombosis of right internal jugular vein: Principal | ICD-10-CM

## 2017-06-03 HISTORY — PX: PORTA CATH REMOVAL: CATH118286

## 2017-06-03 LAB — HEPARIN LEVEL (UNFRACTIONATED)
HEPARIN UNFRACTIONATED: 0.51 [IU]/mL (ref 0.30–0.70)
Heparin Unfractionated: 0.61 IU/mL (ref 0.30–0.70)

## 2017-06-03 LAB — COMPREHENSIVE METABOLIC PANEL
ALT: 10 U/L — ABNORMAL LOW (ref 14–54)
AST: 18 U/L (ref 15–41)
Albumin: 3.9 g/dL (ref 3.5–5.0)
Alkaline Phosphatase: 135 U/L — ABNORMAL HIGH (ref 38–126)
Anion gap: 10 (ref 5–15)
BUN: 7 mg/dL (ref 6–20)
CHLORIDE: 106 mmol/L (ref 101–111)
CO2: 24 mmol/L (ref 22–32)
Calcium: 8.8 mg/dL — ABNORMAL LOW (ref 8.9–10.3)
Creatinine, Ser: 0.96 mg/dL (ref 0.44–1.00)
Glucose, Bld: 106 mg/dL — ABNORMAL HIGH (ref 65–99)
POTASSIUM: 4 mmol/L (ref 3.5–5.1)
Sodium: 140 mmol/L (ref 135–145)
Total Bilirubin: 0.8 mg/dL (ref 0.3–1.2)
Total Protein: 6.3 g/dL — ABNORMAL LOW (ref 6.5–8.1)

## 2017-06-03 LAB — CBC
HEMATOCRIT: 26.5 % — AB (ref 35.0–47.0)
Hemoglobin: 8.8 g/dL — ABNORMAL LOW (ref 12.0–16.0)
MCH: 30.9 pg (ref 26.0–34.0)
MCHC: 33.4 g/dL (ref 32.0–36.0)
MCV: 92.5 fL (ref 80.0–100.0)
Platelets: 81 10*3/uL — ABNORMAL LOW (ref 150–440)
RBC: 2.86 MIL/uL — AB (ref 3.80–5.20)
RDW: 24 % — ABNORMAL HIGH (ref 11.5–14.5)
WBC: 22.7 10*3/uL — AB (ref 3.6–11.0)

## 2017-06-03 LAB — GLUCOSE, CAPILLARY: Glucose-Capillary: 92 mg/dL (ref 65–99)

## 2017-06-03 LAB — HIV ANTIBODY (ROUTINE TESTING W REFLEX): HIV Screen 4th Generation wRfx: NONREACTIVE

## 2017-06-03 SURGERY — PORTA CATH REMOVAL
Anesthesia: Moderate Sedation | Laterality: Right

## 2017-06-03 MED ORDER — FENTANYL CITRATE (PF) 100 MCG/2ML IJ SOLN
INTRAMUSCULAR | Status: AC
  Filled 2017-06-03: qty 4

## 2017-06-03 MED ORDER — MIDAZOLAM HCL 2 MG/2ML IJ SOLN
INTRAMUSCULAR | Status: DC | PRN
  Administered 2017-06-03 (×3): 1 mg via INTRAVENOUS

## 2017-06-03 MED ORDER — FAMOTIDINE 20 MG PO TABS
20.0000 mg | ORAL_TABLET | Freq: Two times a day (BID) | ORAL | Status: DC
  Administered 2017-06-03 – 2017-06-04 (×2): 20 mg via ORAL
  Filled 2017-06-03 (×2): qty 1

## 2017-06-03 MED ORDER — HEPARIN SODIUM (PORCINE) 1000 UNIT/ML IJ SOLN
INTRAMUSCULAR | Status: AC
  Filled 2017-06-03: qty 1

## 2017-06-03 MED ORDER — IOPAMIDOL (ISOVUE-300) INJECTION 61%
INTRAVENOUS | Status: DC | PRN
  Administered 2017-06-03: 20 mL via INTRAVENOUS

## 2017-06-03 MED ORDER — FENTANYL CITRATE (PF) 100 MCG/2ML IJ SOLN
INTRAMUSCULAR | Status: DC | PRN
  Administered 2017-06-03: 50 ug via INTRAVENOUS
  Administered 2017-06-03 (×2): 25 ug via INTRAVENOUS

## 2017-06-03 MED ORDER — MIDAZOLAM HCL 5 MG/5ML IJ SOLN
INTRAMUSCULAR | Status: AC
  Filled 2017-06-03: qty 10

## 2017-06-03 MED ORDER — LIDOCAINE-EPINEPHRINE (PF) 1 %-1:200000 IJ SOLN
INTRAMUSCULAR | Status: AC
  Filled 2017-06-03: qty 30

## 2017-06-03 MED ORDER — CEFUROXIME SODIUM 1.5 G IV SOLR
INTRAVENOUS | Status: AC
  Administered 2017-06-03: 1.5 g via INTRAVENOUS
  Filled 2017-06-03: qty 1.5

## 2017-06-03 MED ORDER — DEXTROSE 5 % IV SOLN
2.0000 g | INTRAVENOUS | Status: DC
  Administered 2017-06-03 – 2017-06-04 (×2): 2 g via INTRAVENOUS
  Filled 2017-06-03 (×2): qty 2

## 2017-06-03 SURGICAL SUPPLY — 16 items
BALLN ULTRVRSE 7X60X75C (BALLOONS) ×2
BALLOON ULTRVRSE 7X60X75C (BALLOONS) ×1 IMPLANT
CANISTER PENUMBRA MAX (MISCELLANEOUS) ×2 IMPLANT
CATH INDIGO 6 ST-TIP 135CM (CATHETERS) ×2 IMPLANT
COVER PROBE U/S 5X48 (MISCELLANEOUS) ×2 IMPLANT
DERMABOND ADVANCED (GAUZE/BANDAGES/DRESSINGS) ×1
DERMABOND ADVANCED .7 DNX12 (GAUZE/BANDAGES/DRESSINGS) ×1 IMPLANT
DEVICE PRESTO INFLATION (MISCELLANEOUS) ×2 IMPLANT
PACK ANGIOGRAPHY (CUSTOM PROCEDURE TRAY) ×2 IMPLANT
PENCIL ELECTRO HAND CTR (MISCELLANEOUS) ×2 IMPLANT
SHEATH BRITE TIP 6FRX11 (SHEATH) ×2 IMPLANT
SPONGE XRAY 4X4 16PLY STRL (MISCELLANEOUS) ×2 IMPLANT
SUT MNCRL AB 4-0 PS2 18 (SUTURE) ×2 IMPLANT
SUTURE VIC 3-0 (SUTURE) ×2 IMPLANT
TUBING ASPIRATION INDIGO (MISCELLANEOUS) ×2 IMPLANT
WIRE MAGIC TOR.035 180C (WIRE) ×2 IMPLANT

## 2017-06-03 NOTE — Progress Notes (Signed)
Patient ID: Stacy Murillo, female   DOB: 1956-04-26, 62 y.o.   MRN: [id number]  [facility] PROGRESS NOTE  Melis B [name] LTJ:[id number] DOB: [birth year] DOA: 06/02/2017 PCP: Crecencio Mc, MD  HPI/Subjective: Patient having some neck pain and some difficulty swallowing.  Found to have DVT in the right internal jugular vein.  Scheduled for Port-A-Cath removal today  Objective: Vitals:   06/03/17 0439 06/03/17 1234  BP: (!) 117/56 (!) 141/70  Pulse: 80 81  Resp: 18 16  Temp: 99.8 F (37.7 C) 99 F (37.2 C)  SpO2: 97% 99%    Filed Weights   06/02/17 0927  Weight: 78 kg (172 lb)    ROS: Review of Systems  Constitutional: Negative for chills and fever.  Eyes: Negative for blurred vision.  Respiratory: Negative for cough and shortness of breath.   Cardiovascular: Negative for chest pain.  Gastrointestinal: Negative for abdominal pain, constipation, diarrhea, nausea and vomiting.  Genitourinary: Negative for dysuria.  Musculoskeletal: Negative for joint pain.  Neurological: Negative for dizziness and headaches.   Exam: Physical Exam  Constitutional: She is oriented to person, place, and time.  HENT:  Nose: No mucosal edema.  Mouth/Throat: No oropharyngeal exudate or posterior oropharyngeal edema.  Eyes: Conjunctivae, EOM and lids are normal. Pupils are equal, round, and reactive to light.  Neck: No JVD present. Carotid bruit is not present. No edema present. No thyroid mass and no thyromegaly present.  Cardiovascular: S1 normal and S2 normal. Exam reveals no gallop.  No murmur heard. Pulses:      Dorsalis pedis pulses are 2+ on the right side, and 2+ on the left side.  Respiratory: No respiratory distress. She has no wheezes. She has no rhonchi. She has no rales.  GI: Soft. Bowel sounds are normal. There is no tenderness.  Musculoskeletal:       Right ankle: She exhibits swelling.       Left ankle: She exhibits swelling.  Lymphadenopathy:    She has no cervical  adenopathy.  Neurological: She is alert and oriented to person, place, and time. No cranial nerve deficit.  Skin: Skin is warm. No rash noted. Nails show no clubbing.  Psychiatric: She has a normal mood and affect.      Data Reviewed: Basic Metabolic Panel: Recent Labs  Lab 06/02/17 0949 06/03/17 0200  NA 137 140  K 3.8 4.0  CL 103 106  CO2 23 24  GLUCOSE 103* 106*  BUN 8 7  CREATININE 1.11* 0.96  CALCIUM 9.4 8.8*   Liver Function Tests: Recent Labs  Lab 06/02/17 0949 06/03/17 0200  AST 20 18  ALT 12* 10*  ALKPHOS 151* 135*  BILITOT 0.7 0.8  PROT 6.9 6.3*  ALBUMIN 4.2 3.9   CBC: Recent Labs  Lab 05/29/17 0731 06/02/17 0949 06/03/17 0200  WBC 10.3 20.6* 22.7*  NEUTROABS 8.6* 16.9*  --   HGB 9.3* 9.5* 8.8*  HCT 27.4* 29.4* 26.5*  MCV 91.1 92.9 92.5  PLT 33* 61* 81*    CBG: Recent Labs  Lab 06/03/17 0732  GLUCAP 92    Recent Results (from the past 240 hour(s))  Blood Culture (routine x 2)     Status: None (Preliminary result)   Collection Time: 06/02/17  9:40 AM  Result Value Ref Range Status   Specimen Description LEFT ANTECUBITAL  Final   Special Requests   Final    BOTTLES DRAWN AEROBIC AND ANAEROBIC Blood Culture results may not be optimal due to an excessive  volume of blood received in culture bottles   Culture   Final    NO GROWTH < 24 HOURS Performed at [facility], [address] [place], [place] [address]    Report Status PENDING  Incomplete  Blood Culture (routine x 2)     Status: None (Preliminary result)   Collection Time: 06/02/17  9:49 AM  Result Value Ref Range Status   Specimen Description BLOOD LEFT HAND  Final   Special Requests   Final    BOTTLES DRAWN AEROBIC AND ANAEROBIC Blood Culture adequate volume   Culture   Final    NO GROWTH < 24 HOURS Performed at [facility], [address], [place], [place] [address]    Report Status PENDING  Incomplete     Studies: Dg Chest 2 View  Result Date:  06/02/2017 CLINICAL DATA:  Right-sided swelling in the neck.  Fever. EXAM: CHEST  2 VIEW COMPARISON:  Chest CT 12/05/2016 FINDINGS: Right jugular port is stable with the tip in the upper SVC. Both lungs are clear. Heart and mediastinum are within normal limits. Trachea is midline. No pleural effusions. No acute bone abnormality. IMPRESSION: No active cardiopulmonary disease. Stable appearance of the right chest Port-A-Cath. Electronically Signed   By: Markus Daft M.D.   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 Right side chest porta cath with new onset right neck swelling, low-grade fever. EXAM: CT NECK WITH CONTRAST TECHNIQUE: Multidetector CT imaging of the neck was performed using the standard protocol following the bolus administration of intravenous contrast. CONTRAST:  70mL ISOVUE-300 IOPAMIDOL (ISOVUE-300) INJECTION 61% COMPARISON:  PET-CT 05/09/2017 and earlier. FINDINGS: Pharynx and larynx: Mild to moderate retropharyngeal space effusion. Mild-to-moderate right parapharyngeal space inflammatory stranding. Negative left parapharyngeal space. Mild generalized edema in the mucosal space of the hypopharynx and larynx. Mild rightward mass effect on the lower pharynx and larynx. The nasopharynx and soft palate appear stable and within normal limits. Salivary glands: Inflammatory stranding in the right submandibular space. The right submandibular gland appears to remain normal. Negative sublingual space and left submandibular space. Both parotid glands remain normal. Thyroid: Negative. Lymph nodes: Small hyperenhancing reactive appearing lymph nodes at the right level 2 and level 3 nodal stations. No enlarged or suspicious cervical lymph nodes. Vascular: Thrombosis of the right internal jugular vein. The right subclavian, right innominate vein and visible SVC remain patent, along with left innominate  vein, but there is thrombosis of the right IJ from the angle of the mandible caudally to its confluence with the right subclavian vein. This is a segment of about 7 centimeters. New line right IJ approach porta cath is in place. There is no thrombus along the catheter below the level of right IJ clot. Beginning at the angle of the mandible where a retromandibular vein communicates with the right IJ vessel is patent, and patent to the skull base including the right IJ bulb and visible right sigmoid sinus. There is associated inflammatory stranding in an around the right carotid space, tracking to the areas stated above. The other major vascular structures in the neck and at the skull base are patent the left IJ, left subclavian vein, and left innominate vein are patent. Limited intracranial: Negative. Visualized orbits: Negative. Mastoids and visualized paranasal sinuses: Stable and well pneumatized. Skeleton: No acute or suspicious osseous lesion identified. Upper chest: Negative superior mediastinum. Negative visible lung parenchyma.  No axillary lymphadenopathy identified. IMPRESSION: 1. Positive for right IJ thrombosis. A segment of about 7 cm of the vessel is thrombosed from the right thoracic inlet to the angle of the mandible. Associated inflammation in and around the right carotid space (see #3), and mild reactive right cervical lymphadenopathy. 2. The SVC, both innominate veins, and right subclavian vein remain patent. And in the upper neck and at the skull base the right IJ remains patent. The visible right sigmoid sinus is patent. Other major vascular structures in the mediastinum, neck, and at the skull base are patent. 3. Mild associated mass effect on the right pharynx with right parapharyngeal space inflammation and mild retropharyngeal space effusion. Critical Value/emergent results were called by telephone at the time of interpretation on 06/02/2017 at 1115 hours to Dr. Darel Hong , who verbally  acknowledged these results. Electronically Signed   By: Genevie Ann M.D.   On: 06/02/2017 11:19   US Venous Img Upper Uni Right  Result Date: 06/02/2017 CLINICAL DATA:  Diffuse large B-cell lymphoma. New onset of right neck swelling. EXAM: RIGHT UPPER EXTREMITY VENOUS DOPPLER ULTRASOUND TECHNIQUE: Gray-scale sonography with graded compression, as well as color Doppler and duplex ultrasound were performed to evaluate the upper extremity deep venous system from the level of the subclavian vein and including the jugular, axillary, basilic, radial, ulnar and upper cephalic vein. Spectral Doppler was utilized to evaluate flow at rest and with distal augmentation maneuvers. COMPARISON:  Neck CT 06/02/2017 FINDINGS: Contralateral Subclavian Vein: No thrombus. Color Doppler flow and normal phasicity. Internal Jugular Vein: Echogenic thrombus in the right internal jugular vein without compressibility. Expansion of the right internal jugular vein suggesting acute or subacute thrombus. Port catheter not imaged on this examination. This right internal jugular vein thrombus is occlusive. Subclavian Vein: No evidence of thrombus. Normal compressibility and color Doppler flow in the right subclavian vein. Axillary Vein: No evidence of thrombus. Normal compressibility, respiratory phasicity and response to augmentation. Cephalic Vein: No evidence of thrombus. Normal compressibility, respiratory phasicity and response to augmentation. Basilic Vein: No evidence of thrombus. Normal compressibility, respiratory phasicity and response to augmentation. Brachial Veins: No evidence of thrombus. Normal compressibility, respiratory phasicity and response to augmentation. Radial Veins: No evidence of thrombus. Normal compressibility and color Doppler flow. Ulnar Veins: No evidence of thrombus. Normal compressibility and color Doppler flow. Other Findings:  None visualized. IMPRESSION: Positive for occlusive thrombus in the right internal  jugular vein. No other thrombus in the right upper extremity. Electronically Signed   By: Markus Daft M.D.   On: 06/02/2017 11:52    Scheduled Meds: . acyclovir  400 mg Oral BID  . calcium carbonate  1 tablet Oral BID  . docusate sodium  100 mg Oral BID  . Lifitegrast  1 drop Both Eyes Daily  . pantoprazole (PROTONIX) IV  40 mg Intravenous Q12H   Continuous Infusions: . sodium chloride 75 mL/hr at 06/03/17 0240  . sodium chloride    . cefTRIAXone (ROCEPHIN)  IV Stopped (06/03/17 1235)  . cefUROXime (ZINACEF)  IV    . heparin 1,150 Units/hr (06/03/17 0729)  . vancomycin Stopped (06/03/17 2585)    Assessment/Plan:  1. DVT right jugular vein.  Neck pain on the right.  Patient started on heparin drip.  The patient states that she had a DVT in the past in her leg.  Likely will need Eliquis longer term upon discharge. 2. Fever and CBC showing toxic granulation.  Continue aggressive IV antibiotics for now.  So far cultures are negative. 3. History of monoclonal B cell lymphoma 4. History of esophagitis on H2 blocker  Code Status:     Code Status Orders  (From admission, onward)        Start     Ordered   06/02/17 1248  Full code  Continuous     06/02/17 1248    Code Status History    Date Active Date Inactive Code Status Order ID Comments User Context   03/19/2016 09:30 03/23/2016 20:45 Full Code [id number]  Cammie Sickle, MD Inpatient   02/27/2016 11:54 03/03/2016 17:03 Full Code [id number]  Cammie Sickle, MD Inpatient   01/23/2016 10:53 01/28/2016 14:16 Full Code [id number]  Cammie Sickle, MD Inpatient   12/05/2015 12:23 12/10/2015 19:37 Full Code [id number]  Cammie Sickle, MD Inpatient   10/19/2015 21:33 10/20/2015 16:54 Full Code [id number]  Fritzi Mandes, MD Inpatient    Advance Directive Documentation     Most Recent Value  Type of Advance Directive  Healthcare Power of Attorney, Living will  Pre-existing out of facility DNR order (yellow form or pink  MOST form)  No data  "MOST" Form in Place?  No data     Family Communication: Daughter at bedside Disposition Plan: Likely will need a day or 2 of IV antibiotics  Consultants:  Vascular surgery  Procedures:  Today will have Port-A-Cath removal and thrombectomy  Antibiotics:  Rocephin  Vancomycin  Time spent: 28 minutes  [place]

## 2017-06-03 NOTE — Progress Notes (Signed)
ANTICOAGULATION CONSULT NOTE - Initial Consult  Pharmacy Consult for Heparin  Indication: pulmonary embolus  No Known Allergies  Patient Measurements: Height: 5\' 2"  (157.5 cm) Weight: 172 lb (78 kg) IBW/kg (Calculated) : 50.1 Heparin Dosing Weight: 67.2  Vital Signs: Temp: 100.4 F (38 C) (01/20 2128) Temp Source: Oral (01/20 2128) BP: 137/65 (01/20 2128) Pulse Rate: 94 (01/20 2128)  Labs: Recent Labs    06/02/17 0949 06/02/17 1508 06/02/17 1932 06/03/17 0200  HGB 9.5*  --   --  8.8*  HCT 29.4*  --   --  26.5*  PLT 61*  --   --  81*  APTT 28  --   --   --   LABPROT 12.0  --   --   --   INR 0.89  --   --   --   HEPARINUNFRC  --  0.68 0.58 0.61  CREATININE 1.11*  --   --  0.96    Estimated Creatinine Clearance: 58.8 mL/min (by C-G formula based on SCr of 0.96 mg/dL).   Medical History: Past Medical History:  Diagnosis Date  . Arthritis   . Blood dyscrasia   . Cancer ([place])   . Chest pain, unspecified   . Diverticulosis 07/06/15  . Dysrhythmia    tachycardia  . Esophagitis 07/06/15  . Fatigue   . Gastritis 07/06/15  . GERD (gastroesophageal reflux disease)   . 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 Leg cramps   . Lymphoma ([place]) 09/14/15   Monoclonal B cell lymphoma  . Night sweats   . Osteoporosis   . Overweight(278.02)    Obesity  . Palpitations   . Shortness of breath dyspnea   . Tachycardia   . Thrombocytopenia ([place])     Medications:  Medications Prior to Admission  Medication Sig Dispense Refill Last Dose  . acetaminophen (TYLENOL 8 HOUR) 650 MG CR tablet Take 650 mg by mouth every 8 (eight) hours as needed for pain.   PRN at PRN  . acyclovir (ZOVIRAX) 400 MG tablet Take 1 tablet (400 mg total) 2 (two) times daily by mouth. One pill a day [to prevent shingles] 60 tablet 3 06/02/2017 at AM  . Alum Hydroxide-Mag Carbonate (GAVISCON PO) Take by mouth.   PRN at PRN  . CALCIUM PO Take 600 mg 2 (two) times daily by mouth.    06/02/2017 at AM  . Javier Docker Oil 1000  MG CAPS Take 1 capsule by mouth daily.    06/02/2017 at AM  . lidocaine-prilocaine (EMLA) cream Apply to affected area once 30 g 3 PRN at PRN  . LORazepam (ATIVAN) 0.5 MG tablet Take 1 tablet (0.5 mg total) by mouth every 8 (eight) hours. 30 tablet 0 PRN at PRN  . ondansetron (ZOFRAN) 8 MG tablet Take 1 tablet (8 mg total) 2 (two) times daily as needed by mouth. Start on the 3rd day after cisplatin chemotherapy. 30 tablet 1 PRN at PRN  . ranitidine (ZANTAC) 150 MG tablet Take 150 mg by mouth 2 (two) times daily.   06/02/2017 at AM  . Shirley Friar 5 % SOLN 1 drop.   4 06/02/2017 at AM  . dexamethasone (DECADRON) 4 MG tablet Take 10 tablets (40 mg total) by mouth daily. Start the day after cisplatin chemotherapy for 4 days. (Patient not taking: Reported on 05/29/2017) 40 tablet 3 -- at --  . prochlorperazine (COMPAZINE) 10 MG tablet Take 1 tablet (10 mg total) every 6 (six) hours as needed by mouth (Nausea or vomiting). (Patient  not taking: Reported on 05/29/2017) 30 tablet 1 -- at --  . promethazine (PHENERGAN) 25 MG tablet Take 1 tablet (25 mg total) by mouth every 8 (eight) hours as needed for nausea or vomiting. (Patient not taking: Reported on 05/29/2017) 30 tablet 0 -- at --   Scheduled:  . acyclovir  400 mg Oral BID  . calcium carbonate  1 tablet Oral BID  . docusate sodium  100 mg Oral BID  . Lifitegrast  1 drop Both Eyes Daily  . pantoprazole (PROTONIX) IV  40 mg Intravenous Q12H    Assessment: Pharmacy consulted to dose and monitor heparin in this 62 year old woman being treated for pulmonary embolism  Goal of Therapy:  Heparin level 0.3-0.7 units/ml Monitor platelets by anticoagulation protocol: Yes   Plan:  Give 4000 units bolus x 1 Start heparin infusion at 1150 units/hr Check anti-Xa level in 6 hours and daily while on heparin Continue to monitor H&H and platelets   1/20:  HL @ 15:08 = 0.68.   HL drawn only 2 1/2 hrs after start of drip so not a valid level .   Reordered level for 19:30  , which was 7 hrs after start.  1/20:  HL @ 19:30 = 0.58.  Will continue this pt on current rate and draw confirmation level on 1/21 @ 0130.   01/21 @ 0200 HL 0.61 therapeutic. Will continue current rate and will recheck HL w/ am labs. Will continue to monitor CBC.  Tobie Lords, PharmD, BCPS Clinical Pharmacist 06/03/2017

## 2017-06-03 NOTE — Progress Notes (Addendum)
ANTICOAGULATION CONSULT NOTE   Pharmacy Consult for Heparin  Indication: pulmonary embolus  No Known Allergies  Patient Measurements: Height: 5\' 2"  (157.5 cm) Weight: 172 lb (78 kg) IBW/kg (Calculated) : 50.1 Heparin Dosing Weight: 67.2  Vital Signs: Temp: 98.7 F (37.1 C) (01/21 1419) Temp Source: Oral (01/21 1419) BP: 147/70 (01/21 1704) Pulse Rate: 94 (01/21 1704)  Labs: Recent Labs    06/02/17 0949 06/02/17 1508 06/02/17 1932 06/03/17 0200  HGB 9.5*  --   --  8.8*  HCT 29.4*  --   --  26.5*  PLT 61*  --   --  81*  APTT 28  --   --   --   LABPROT 12.0  --   --   --   INR 0.89  --   --   --   HEPARINUNFRC  --  0.68 0.58 0.61  CREATININE 1.11*  --   --  0.96    Estimated Creatinine Clearance: 58.8 mL/min (by C-G formula based on SCr of 0.96 mg/dL).   Medical History: Past Medical History:  Diagnosis Date  . Arthritis   . Blood dyscrasia   . Cancer ([place])   . Chest pain, unspecified   . Diverticulosis 07/06/15  . Dysrhythmia    tachycardia  . Esophagitis 07/06/15  . Fatigue   . Gastritis 07/06/15  . GERD (gastroesophageal reflux disease)   . 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 Leg cramps   . Lymphoma ([place]) 09/14/15   Monoclonal B cell lymphoma  . Night sweats   . Osteoporosis   . Overweight(278.02)    Obesity  . Palpitations   . Shortness of breath dyspnea   . Tachycardia   . Thrombocytopenia ([place])     Assessment: Pharmacy consulted to dose and monitor heparin in this 62 year old woman being treated for pulmonary embolism   Goal of Therapy:  Heparin level 0.3-0.7 units/ml Monitor platelets by anticoagulation protocol: Yes   Plan:  Give 4000 units bolus x 1 Start heparin infusion at 1150 units/hr Check anti-Xa level in 6 hours and daily while on heparin Continue to monitor H&H and platelets   1/20:  HL @ 15:08 = 0.68.   HL drawn only 2 1/2 hrs after start of drip so not a valid level .   Reordered level for 19:30 , which was 7 hrs after  start.  1/20:  HL @ 19:30 = 0.58.  Will continue this pt on current rate and draw confirmation level on 1/21 @ 0130.   01/21 @ 0200 HL 0.61 therapeutic. Will continue current rate and will recheck HL w/ am labs. Will continue to monitor CBC.  1/21 - Pt had vascular procedure and heparin drip was paused during procedure. Per Specials RN, heparin resumed at 1620 at previous rate per instructions from Dr. Lucky Cowboy. Will order heparin level for six hour after restart at 2230. CBC in AM.   Rayna Sexton, PharmD, BCPS Clinical Pharmacist 06/03/2017 5:25 PM    1/21 2300 heparin level 0.51. Continue current regimen. Recheck heparin level and CBC with tomorrow AM labs.  1/22 AM heparin level 0.58. Continue current regimen. Recheck heparin level and CBC with tomorrow AM labs.  Sim Boast, PharmD, BCPS  06/03/17 11:35 PM

## 2017-06-03 NOTE — Consult Note (Signed)
[facility] Consult Note  MRN : [id number]  Stacy Murillo is a 62 y.o. (01/09/1956) female who presents with chief complaint of  Chief Complaint  Patient presents with  . Neck Pain  .  History of Present Illness: I am asked to see the patient by Dr. Mable Paris in the ER for right IJ thrombosis in the face of a Port-A-Cath.  She presents with headaches and difficulty swelling as well as neck pain for several days.  This prompted emergency room visit yesterday where a CT scan of the neck was performed which I have reviewed.  This shows right jugular vein thrombosis around the catheter and extending cephalad and slightly caudal to the catheter entry site and the jugular vein for a couple of centimeters.  The superior vena cava appears to be patent.  The patient has completed her chemotherapy currently, and will likely need further treatment in several weeks but does not have an immediate need for a venous access device.  I discussed the case with her oncologist.  She had some subjective fevers but no temperatures of over 101 here.  There was no clear inciting event or causative factor that started the symptoms.  Current Facility-Administered Medications  Medication Dose Route Frequency Provider Last Rate Last Dose  . 0.9 %  sodium chloride infusion   Intravenous Continuous Idelle Crouch, MD 75 mL/hr at 06/03/17 0240    . 0.9 %  sodium chloride infusion   Intravenous Continuous Algernon Huxley, MD      . Doug Sou Hold] acetaminophen (TYLENOL) tablet 650 mg  650 mg Oral Q6H PRN Idelle Crouch, MD   650 mg at 06/03/17 1000   Or  . [MAR Hold] acetaminophen (TYLENOL) suppository 650 mg  650 mg Rectal Q6H PRN Idelle Crouch, MD      . Doug Sou Hold] acyclovir (ZOVIRAX) 200 MG capsule 400 mg  400 mg Oral BID Larene Beach, RPH   400 mg at 06/02/17 1954  . [MAR Hold] bisacodyl (DULCOLAX) suppository 10 mg  10 mg Rectal Daily PRN Idelle Crouch, MD      . Doug Sou Hold]  calcium carbonate (TUMS - dosed in mg elemental calcium) chewable tablet 200 mg of elemental calcium  1 tablet Oral BID Larene Beach, RPH   200 mg of elemental calcium at 06/02/17 1954  . [MAR Hold] cefTRIAXone (ROCEPHIN) 2 g in dextrose 5 % 50 mL IVPB  2 g Intravenous Q24H Lenis Noon, [facility]   Stopped at 06/03/17 1235  . cefUROXime (ZINACEF) 1.5 g in dextrose 5 % 50 mL IVPB  1.5 g Intravenous 30 min Pre-Op Dew, Erskine Squibb, MD      . dextrose 5 % with cefUROXime (ZINACEF) ADS Med           . Doug Sou Hold] docusate sodium (COLACE) capsule 100 mg  100 mg Oral BID Idelle Crouch, MD      . Doug Sou Hold] famotidine (PEPCID) tablet 20 mg  20 mg Oral BID Wieting, Richard, MD      . heparin ADULT infusion 100 units/mL (25000 units/282m sodium chloride 0.45%)  1,150 Units/hr Intravenous Continuous JLarene Beach RPH 11.5 mL/hr at 06/03/17 0729 1,150 Units/hr at 06/03/17 0729  . [MAR Hold] Lifitegrast 5 % SOLN 1 drop  1 drop Both Eyes Daily SIdelle Crouch MD   1 drop at 06/02/17 1518  . [MAR Hold] LORazepam (ATIVAN) tablet 0.5 mg  0.5 mg Oral Q6H  PRN Idelle Crouch, MD      . Doug Sou Hold] morphine 2 MG/ML injection 2 mg  2 mg Intravenous Q2H PRN Idelle Crouch, MD      . Doug Sou Hold] ondansetron [facility]) tablet 4 mg  4 mg Oral Q6H PRN Idelle Crouch, MD       Or  . Doug Sou Hold] ondansetron [facility]) injection 4 mg  4 mg Intravenous Q6H PRN Idelle Crouch, MD      . Doug Sou Hold] vancomycin (VANCOCIN) IVPB 1000 mg/200 mL premix  1,000 mg Intravenous Q18H Larene Beach, [facility]   Stopped at 06/03/17 8916   Facility-Administered Medications Ordered in Other Encounters  Medication Dose Route Frequency Provider Last Rate Last Dose  . 0.9 %  sodium chloride infusion   Intravenous Continuous Evlyn Kanner, NP   Stopped at 10/14/15 1312  . methotrexate (50 mg/ml) 6.3 g in sodium chloride 0.9 % 1,000 mL injection   Intravenous Once Charlaine Dalton R, MD      . sodium chloride flush (NS) 0.9 %  injection 10 mL  10 mL Intravenous PRN Cammie Sickle, MD   10 mL at 10/05/16 1400    Past Medical History:  Diagnosis Date  . Arthritis   . Blood dyscrasia   . Cancer ([place])   . Chest pain, unspecified   . Diverticulosis 07/06/15  . Dysrhythmia    tachycardia  . Esophagitis 07/06/15  . Fatigue   . Gastritis 07/06/15  . GERD (gastroesophageal reflux disease)   . 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 Leg cramps   . Lymphoma ([place]) 09/14/15   Monoclonal B cell lymphoma  . Night sweats   . Osteoporosis   . Overweight(278.02)    Obesity  . Palpitations   . Shortness of breath dyspnea   . Tachycardia   . Thrombocytopenia ([place])     Past Surgical History:  Procedure Laterality Date  . ABDOMINAL HYSTERECTOMY  2005  . BONE MARROW BIOPSY  09/14/15  . CHOLECYSTECTOMY  1997  . COLONOSCOPY  07/05/2014  . ESOPHAGOGASTRODUODENOSCOPY  07/05/2014  . INGUINAL LYMPH NODE BIOPSY Right 10/05/2015   Procedure: INGUINAL LYMPH NODE BIOPSY;  Surgeon: Christene Lye, MD;  Location: [facility] ORS;  Service: General;  Laterality: Right;  . PERIPHERAL VASCULAR CATHETERIZATION N/A 09/27/2015   Procedure: Glori Luis Cath Insertion;  Surgeon: Algernon Huxley, MD;  Location: [facility];  Service: Cardiovascular;  Laterality: N/A;  . PORTACATH PLACEMENT Right     Social History Social History   Tobacco Use  . Smoking status: Never Smoker  . Smokeless tobacco: Never Used  Substance Use Topics  . Alcohol use: No    Alcohol/week: 0.0 oz  . Drug use: No    Family History Family History  Problem Relation Age of Onset  . Heart disease Father        CABG x 4  . Multiple myeloma Father   . Hypertension Mother   . Pancreatic cancer Mother   . Ulcers Mother   . Asthma Mother   . Brain cancer Maternal Uncle   . Multiple myeloma Maternal Uncle   . Diabetes Neg Hx   . Breast cancer Neg Hx     No Known Allergies   REVIEW OF SYSTEMS (Negative unless checked)  Constitutional: [] Weight loss   [x] Fever  [] Chills Cardiac: [] Chest pain   [] Chest pressure   [x] Palpitations   [] Shortness of breath when laying flat   [] Shortness of breath at rest   []   Shortness of breath with exertion. Vascular:  [] Pain in legs with walking   [] Pain in legs at rest   [] Pain in legs when laying flat   [] Claudication   [] Pain in feet when walking  [] Pain in feet at rest  [] Pain in feet when laying flat   [x] History of DVT   [] Phlebitis   [] Swelling in legs   [] Varicose veins   [] Non-healing ulcers Pulmonary:   [] Uses home oxygen   [] Productive cough   [] Hemoptysis   [] Wheeze  [] COPD   [] Asthma Neurologic:  [] Dizziness  [] Blackouts   [] Seizures   [] History of stroke   [] History of TIA  [] Aphasia   [] Temporary blindness   [] Dysphagia   [] Weakness or numbness in arms   [] Weakness or numbness in legs Musculoskeletal:  [x] Arthritis   [] Joint swelling   [] Joint pain   [] Low back pain Hematologic:  [] Easy bruising  [] Easy bleeding   [] Hypercoagulable state   [x] Anemic  [] Hepatitis Gastrointestinal:  [] Blood in stool   [] Vomiting blood  [x] Gastroesophageal reflux/heartburn   [] Difficulty swallowing. Genitourinary:  [] Chronic kidney disease   [] Difficult urination  [] Frequent urination  [] Burning with urination   [] Blood in urine Skin:  [] Rashes   [] Ulcers   [] Wounds Psychological:  [] History of anxiety   []  History of major depression.  Physical Examination  Vitals:   06/02/17 2128 06/03/17 0439 06/03/17 1234 06/03/17 1419  BP: 137/65 (!) 117/56 (!) 141/70 (!) 151/78  Pulse: 94 80 81 88  Resp: 18 18 16 18   Temp: (!) 100.4 F (38 C) 99.8 F (37.7 C) 99 F (37.2 C) 98.7 F (37.1 C)  TempSrc: Oral Oral Oral Oral  SpO2: 95% 97% 99% 99%  Weight:      Height:       Body mass index is 31.46 kg/m. Gen:  WD/WN, NAD Head: [place]/AT, No temporalis wasting. Ear/Nose/Throat: Hearing grossly intact, nares w/o erythema or drainage, oropharynx w/o Erythema/Exudate Eyes: Sclera non-icteric, conjunctiva clear Neck: Trachea  midline. mild to moderate right neck swelling is present. Pulmonary:  Good air movement, respirations not labored, equal bilaterally.  Cardiac: RRR, no JVD Vascular:  Vessel Right Left  Radial Palpable Palpable                                    Musculoskeletal: M/S 5/5 throughout.  Extremities without ischemic changes.  No deformity or atrophy. No edema. Neurologic: Sensation grossly intact in extremities.  Symmetrical.  Speech is fluent. Motor exam as listed above. Psychiatric: Judgment intact, Mood & affect appropriate for pt's clinical situation. Dermatologic: No rashes or ulcers noted.  No cellulitis or open wounds.       CBC Lab Results  Component Value Date   WBC 22.7 (H) 06/03/2017   HGB 8.8 (L) 06/03/2017   HCT 26.5 (L) 06/03/2017   MCV 92.5 06/03/2017   PLT 81 (L) 06/03/2017    BMET    Component Value Date/Time   NA 140 06/03/2017 0200   NA 139 06/29/2010 0749   K 4.0 06/03/2017 0200   CL 106 06/03/2017 0200   CO2 24 06/03/2017 0200   GLUCOSE 106 (H) 06/03/2017 0200   BUN 7 06/03/2017 0200   BUN 16 06/29/2010 0749   CREATININE 0.96 06/03/2017 0200   CALCIUM 8.8 (L) 06/03/2017 0200   GFRNONAA >60 06/03/2017 0200   GFRAA >60 06/03/2017 0200   Estimated Creatinine Clearance: 58.8 mL/min (by C-G formula based  on SCr of 0.96 mg/dL).  COAG Lab Results  Component Value Date   INR 0.89 06/02/2017   INR 0.87 05/08/2017   INR 0.93 05/03/2016    Radiology Dg Chest 2 View  Result Date: 06/02/2017 CLINICAL DATA:  Right-sided swelling in the neck.  Fever. EXAM: CHEST  2 VIEW COMPARISON:  Chest CT 12/05/2016 FINDINGS: Right jugular port is stable with the tip in the upper SVC. Both lungs are clear. Heart and mediastinum are within normal limits. Trachea is midline. No pleural effusions. No acute bone abnormality. IMPRESSION: No active cardiopulmonary disease. Stable appearance of the right chest Port-A-Cath. Electronically Signed   By: Markus Daft M.D.   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 Right side chest porta cath with new onset right neck swelling, low-grade fever. EXAM: CT NECK WITH CONTRAST TECHNIQUE: Multidetector CT imaging of the neck was performed using the standard protocol following the bolus administration of intravenous contrast. CONTRAST:  44m ISOVUE-300 IOPAMIDOL (ISOVUE-300) INJECTION 61% COMPARISON:  PET-CT 05/09/2017 and earlier. FINDINGS: Pharynx and larynx: Mild to moderate retropharyngeal space effusion. Mild-to-moderate right parapharyngeal space inflammatory stranding. Negative left parapharyngeal space. Mild generalized edema in the mucosal space of the hypopharynx and larynx. Mild rightward mass effect on the lower pharynx and larynx. The nasopharynx and soft palate appear stable and within normal limits. Salivary glands: Inflammatory stranding in the right submandibular space. The right submandibular gland appears to remain normal. Negative sublingual space and left submandibular space. Both parotid glands remain normal. Thyroid: Negative. Lymph nodes: Small hyperenhancing reactive appearing lymph nodes at the right level 2 and level 3 nodal stations. No enlarged or suspicious cervical lymph nodes. Vascular: Thrombosis of the right internal jugular vein. The right subclavian, right innominate vein and visible SVC remain patent, along with left innominate vein, but there is thrombosis of the right IJ from the angle of the mandible caudally to its confluence with the right subclavian vein. This is a segment of about 7 centimeters. New line right IJ approach porta cath is in place. There is no thrombus along the catheter below the level of right IJ clot. Beginning at the angle of the mandible where a retromandibular vein communicates with the right IJ vessel is patent, and patent to the skull base including the right IJ bulb and  visible right sigmoid sinus. There is associated inflammatory stranding in an around the right carotid space, tracking to the areas stated above. The other major vascular structures in the neck and at the skull base are patent the left IJ, left subclavian vein, and left innominate vein are patent. Limited intracranial: Negative. Visualized orbits: Negative. Mastoids and visualized paranasal sinuses: Stable and well pneumatized. Skeleton: No acute or suspicious osseous lesion identified. Upper chest: Negative superior mediastinum. Negative visible lung parenchyma. No axillary lymphadenopathy identified. IMPRESSION: 1. Positive for right IJ thrombosis. A segment of about 7 cm of the vessel is thrombosed from the right thoracic inlet to the angle of the mandible. Associated inflammation in and around the right carotid space (see #3), and mild reactive right cervical lymphadenopathy. 2. The SVC, both innominate veins, and right subclavian vein remain patent. And in the upper neck and at the skull base the right IJ remains patent. The visible right sigmoid sinus is patent. Other major vascular structures in the mediastinum, neck, and at the skull base are patent. 3. Mild associated mass effect  on the right pharynx with right parapharyngeal space inflammation and mild retropharyngeal space effusion. Critical Value/emergent results were called by telephone at the time of interpretation on 06/02/2017 at 1115 hours to Dr. Darel Hong , who verbally acknowledged these results. Electronically Signed   By: Genevie Ann M.D.   On: 06/02/2017 11:19   Nm Pet Image Restag (ps) Skull Base To Thigh  Result Date: 05/09/2017 CLINICAL DATA:  Subsequent treatment strategy for lymphoma. EXAM: NUCLEAR MEDICINE PET SKULL BASE TO THIGH TECHNIQUE: 12.8 mCi F-18 FDG was injected intravenously. Full-ring PET imaging was performed from the skull base to thigh after the radiotracer. CT data was obtained and used for attenuation correction and  anatomic localization. FASTING BLOOD GLUCOSE:  Value: 88 mg/dl COMPARISON:  03/21/2017 FINDINGS: NECK: No hypermetabolic lymph nodes in the neck. CHEST: No hypermetabolic mediastinal or hilar nodes. Index right axillary node measures 0.9 cm and has an SUV max equal to 2.3. Previously 1.8 cm with an SUV max of 50.08. No suspicious pulmonary nodules on the CT scan. ABDOMEN/PELVIS: The spleen measures 10 by 4.8 by 13.4 cm (volume = 340 cm^3). The SUV max associated with the spleen is currently 4.16. Previously the spleen had a volume of 650 cm^3 and had an SUV max of 14.6. No focal areas of hypermetabolism within the spleen. No abnormal uptake within the liver or pancreas. The adrenal glands are unremarkable. No hypermetabolic or enlarged lymph nodes within the abdomen or pelvis. SKELETON: Diffuse radiotracer uptake is identified throughout the axial and appendicular skeleton. The appearance is most compatible with treatment related changes. IMPRESSION: 1. Interval resolution of hypermetabolic right axillary adenopathy. 2. Decrease in volume and degree of FDG uptake associated with the spleen. 3. Diffuse radiotracer uptake throughout the axial and appendicular skeleton is identified which is favored to represent treatment related changes. This may obscure any underlying focal areas of increased uptake due to tumor. Electronically Signed   By: Kerby Moors M.D.   On: 05/09/2017 10:44   US Venous Img Upper Uni Right  Result Date: 06/02/2017 CLINICAL DATA:  Diffuse large B-cell lymphoma. New onset of right neck swelling. EXAM: RIGHT UPPER EXTREMITY VENOUS DOPPLER ULTRASOUND TECHNIQUE: Gray-scale sonography with graded compression, as well as color Doppler and duplex ultrasound were performed to evaluate the upper extremity deep venous system from the level of the subclavian vein and including the jugular, axillary, basilic, radial, ulnar and upper cephalic vein. Spectral Doppler was utilized to evaluate flow at rest  and with distal augmentation maneuvers. COMPARISON:  Neck CT 06/02/2017 FINDINGS: Contralateral Subclavian Vein: No thrombus. Color Doppler flow and normal phasicity. Internal Jugular Vein: Echogenic thrombus in the right internal jugular vein without compressibility. Expansion of the right internal jugular vein suggesting acute or subacute thrombus. Port catheter not imaged on this examination. This right internal jugular vein thrombus is occlusive. Subclavian Vein: No evidence of thrombus. Normal compressibility and color Doppler flow in the right subclavian vein. Axillary Vein: No evidence of thrombus. Normal compressibility, respiratory phasicity and response to augmentation. Cephalic Vein: No evidence of thrombus. Normal compressibility, respiratory phasicity and response to augmentation. Basilic Vein: No evidence of thrombus. Normal compressibility, respiratory phasicity and response to augmentation. Brachial Veins: No evidence of thrombus. Normal compressibility, respiratory phasicity and response to augmentation. Radial Veins: No evidence of thrombus. Normal compressibility and color Doppler flow. Ulnar Veins: No evidence of thrombus. Normal compressibility and color Doppler flow. Other Findings:  None visualized. IMPRESSION: Positive for occlusive thrombus in the right internal jugular  vein. No other thrombus in the right upper extremity. Electronically Signed   By: Markus Daft M.D.   On: 06/02/2017 11:52   Ct Biopsy  Result Date: 05/29/2017 CLINICAL DATA:  Diffuse large B-cell lymphoma, relapsed, preop stem-cell transplant EXAM: CT GUIDED DEEP ILIAC BONE ASPIRATION AND CORE BIOPSY TECHNIQUE: The procedure, risks (including but not limited to bleeding, infection, organ damage ), benefits, and alternatives were explained to the patient. Questions regarding the procedure were encouraged and answered. The patient understands and consents to the procedure. Patient was placed supine on the CT gantry and  limited axial scans through the pelvis were obtained. Appropriate skin entry site was identified. Skin site was marked, prepped with chlorhexidine, draped in usual sterile fashion, and infiltrated locally with 1% lidocaine. Intravenous Fentanyl and Versed were administered as conscious sedation during continuous monitoring of the patient's level of consciousness and physiological / cardiorespiratory status by the radiology RN, with a total moderate sedation time of 20 minutes. Under CT fluoroscopic guidance an 11-gauge Cook trocar bone needle was advanced into the right iliac bone just lateral to the sacroiliac joint. Once needle tip position was confirmed, core and aspiration samples were obtained, submitted to pathology for approval. Patient tolerated procedure well. COMPLICATIONS: COMPLICATIONS none IMPRESSION: 1. Technically successful CT guided right iliac bone core and aspiration biopsy. Electronically Signed   By: Lucrezia Europe M.D.   On: 05/29/2017 09:31      Assessment/Plan 1.  Right internal jugular thrombosis associated with Port-A-Cath.  Port should be removed.  Discussed the case with her oncologist and we are going to remove the port and potentially perform some degree of thrombectomy depending on how extensive the thrombosis is at the time of the venogram.  Risks and benefits were discussed with the patient and she is agreeable to proceed.  In several weeks, we will visit what type of venous access device will be best for her.  At this point, a left-sided PICC line or Hickman catheter may be a good option.  She will need to maintain anticoagulation now at least for 3 months following removal of the catheter. 2.  B-cell lymphoma.  The reason for the Port-A-Cath placement to begin with.  Now completed chemotherapy portion of her treatment.  Will require further therapy in the future and need venous access for that, but that is likely in several weeks.  According to her oncologist, she has had a good  response to treatment so far.    Leotis Pain, MD  06/03/2017 3:27 PM    This note was created with Dragon medical transcription system.  Any error is purely unintentional

## 2017-06-03 NOTE — Consult Note (Signed)
[place] CONSULT NOTE  Patient Care Team: Crecencio Mc, MD as PCP - General (Internal Medicine) Cammie Sickle, MD as Consulting Physician (Internal Medicine) Christene Lye, MD (General Surgery) [name], MD as Consulting Physician (Cardiology)  CHIEF COMPLAINTS/PURPOSE OF CONSULTATION:  Diffuse large B-cell lymphoma  HISTORY OF PRESENTING ILLNESS:  Stacy Murillo 62 y.o.  female history of recent relapsed diffuse large B-cell lymphoma status post salvage chemotherapy- R-GDP cycle number 3-day 8 approximately 10 days ago-is currently admitted the hospital for right neck pain/difficulty swallowing/fevers.  Patient had a recent bone marrow biopsy-negative for any residual disease; also had a PET scan in [facility] evidence of recurrence.  Patient had recent pulmonary function tests at [facility] upcoming autologous stem cell transplant in the second week of February 2019.  She is awaiting to have a 2D echo/LP as part of the transplant procedure this week.  Patient noted to have right-sided neck pain approximately 2-3 days prior to presentation.  Worsening; along with difficulty swallowing/pain with swallowing.  No nausea no vomiting.  Also had low-grade temperatures.  No cough.  ROS: A complete 10 point review of system is done which is negative except mentioned above in history of present illness  MEDICAL HISTORY:  Past Medical History:  Diagnosis Date  . Arthritis   . Blood dyscrasia   . Cancer ([place])   . Chest pain, unspecified   . Diverticulosis 07/06/15  . Dysrhythmia    tachycardia  . Esophagitis 07/06/15  . Fatigue   . Gastritis 07/06/15  . GERD (gastroesophageal reflux disease)   . 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 Leg cramps   . Lymphoma ([place]) 09/14/15   Monoclonal B cell lymphoma  . Night sweats   . Osteoporosis   . Overweight(278.02)    Obesity  . Palpitations   . Shortness of breath dyspnea   . Tachycardia   .  Thrombocytopenia ([place])     SURGICAL HISTORY: Past Surgical History:  Procedure Laterality Date  . ABDOMINAL HYSTERECTOMY  2005  . BONE MARROW BIOPSY  09/14/15  . CHOLECYSTECTOMY  1997  . COLONOSCOPY  07/05/2014  . ESOPHAGOGASTRODUODENOSCOPY  07/05/2014  . INGUINAL LYMPH NODE BIOPSY Right 10/05/2015   Procedure: INGUINAL LYMPH NODE BIOPSY;  Surgeon: Christene Lye, MD;  Location: [facility] ORS;  Service: General;  Laterality: Right;  . PERIPHERAL VASCULAR CATHETERIZATION N/A 09/27/2015   Procedure: Glori Luis Cath Insertion;  Surgeon: Algernon Huxley, MD;  Location: [place] CV LAB;  Service: Cardiovascular;  Laterality: N/A;  . PORTACATH PLACEMENT Right     SOCIAL HISTORY: Social History   Socioeconomic History  . Marital status: Married    Spouse name: Not on file  . Number of children: Not on file  . Years of education: Not on file  . Highest education level: Not on file  Social Needs  . Financial resource strain: Not on file  . Food insecurity - worry: Not on file  . Food insecurity - inability: Not on file  . Transportation needs - medical: Not on file  . Transportation needs - non-medical: Not on file  Occupational History  . Not on file  Tobacco Use  . Smoking status: Never Smoker  . Smokeless tobacco: Never Used  Substance and Sexual Activity  . Alcohol use: No    Alcohol/week: 0.0 oz  . Drug use: No  . Sexual activity: Yes    Birth control/protection: None  Other Topics Concern  . Not on file  Social History Narrative   Married   Does not get regular exercise    FAMILY HISTORY: Family History  Problem Relation Age of Onset  . Heart disease Father        CABG x 4  . Multiple myeloma Father   . Hypertension Mother   . Pancreatic cancer Mother   . Ulcers Mother   . Asthma Mother   . Brain cancer Maternal Uncle   . Multiple myeloma Maternal Uncle   . Diabetes Neg Hx   . Breast cancer Neg Hx     ALLERGIES:  has No Known Allergies.  MEDICATIONS:   Current Facility-Administered Medications  Medication Dose Route Frequency Provider Last Rate Last Dose  . 0.9 %  sodium chloride infusion   Intravenous Continuous Idelle Crouch, MD 75 mL/hr at 06/03/17 0240    . 0.9 %  sodium chloride infusion   Intravenous Continuous Algernon Huxley, MD      . acetaminophen (TYLENOL) tablet 650 mg  650 mg Oral Q6H PRN Idelle Crouch, MD   650 mg at 06/03/17 1000   Or  . acetaminophen (TYLENOL) suppository 650 mg  650 mg Rectal Q6H PRN Idelle Crouch, MD      . acyclovir (ZOVIRAX) 200 MG capsule 400 mg  400 mg Oral BID Larene Beach, RPH   400 mg at 06/02/17 1954  . bisacodyl (DULCOLAX) suppository 10 mg  10 mg Rectal Daily PRN Idelle Crouch, MD      . calcium carbonate (TUMS - dosed in mg elemental calcium) chewable tablet 200 mg of elemental calcium  1 tablet Oral BID Larene Beach, RPH   200 mg of elemental calcium at 06/02/17 1954  . cefTRIAXone (ROCEPHIN) 2 g in dextrose 5 % 50 mL IVPB  2 g Intravenous Q24H Lenis Noon, [facility]   Stopped at 06/03/17 1235  . cefUROXime (ZINACEF) 1.5 g in dextrose 5 % 50 mL IVPB  1.5 g Intravenous 30 min Pre-Op Algernon Huxley, MD      . docusate sodium (COLACE) capsule 100 mg  100 mg Oral BID Idelle Crouch, MD      . famotidine (PEPCID) tablet 20 mg  20 mg Oral BID Wieting, Richard, MD      . heparin ADULT infusion 100 units/mL (25000 units/258m sodium chloride 0.45%)  1,150 Units/hr Intravenous Continuous JLarene Beach RPH 11.5 mL/hr at 06/03/17 0729 1,150 Units/hr at 06/03/17 0729  . Lifitegrast 5 % SOLN 1 drop  1 drop Both Eyes Daily SIdelle Crouch MD   1 drop at 06/02/17 1518  . LORazepam (ATIVAN) tablet 0.5 mg  0.5 mg Oral Q6H PRN SIdelle Crouch MD      . morphine 2 MG/ML injection 2 mg  2 mg Intravenous Q2H PRN SIdelle Crouch MD      . ondansetron ([facility] tablet 4 mg  4 mg Oral Q6H PRN SIdelle Crouch MD       Or  . ondansetron ([facility] injection 4 mg  4 mg Intravenous Q6H PRN  SIdelle Crouch MD      . vancomycin (VANCOCIN) IVPB 1000 mg/200 mL premix  1,000 mg Intravenous Q18H [facility]e Beach [facility]  Stopped at 06/03/17 01610  Facility-Administered Medications Ordered in Other Encounters  Medication Dose Route Frequency Provider Last Rate Last Dose  . 0.9 %  sodium chloride infusion   Intravenous Continuous HEvlyn Kanner NP   Stopped at 10/14/15 1312  .  methotrexate (50 mg/ml) 6.3 g in sodium chloride 0.9 % 1,000 mL injection   Intravenous Once Charlaine Dalton R, MD      . sodium chloride flush (NS) 0.9 % injection 10 mL  10 mL Intravenous PRN Cammie Sickle, MD   10 mL at 10/05/16 1400      .  PHYSICAL EXAMINATION:  Vitals:   06/03/17 0439 06/03/17 1234  BP: (!) 117/56 (!) 141/70  Pulse: 80 81  Resp: 18 16  Temp: 99.8 F (37.7 C) 99 F (37.2 C)  SpO2: 97% 99%   Filed Weights   06/02/17 0927  Weight: 172 lb (78 kg)    GENERAL: Well-nourished well-developed; Alert, no distress and comfortable.   Accompanied by her husband. EYES: no pallor or icterus OROPHARYNX: no thrush or ulceration. NECK: supple, no masses felt LYMPH:  no palpable lymphadenopathy in the cervical, axillary or inguinal regions.  Vague fullness/tenderness noted in the right side of the neck LUNGS: decreased breath sounds to auscultation at bases and  No wheeze or crackles HEART/CVS: regular rate & rhythm and no murmurs; No lower extremity edema ABDOMEN: abdomen soft, non-tender and normal bowel sounds Musculoskeletal:no cyanosis of digits and no clubbing  PSYCH: alert & oriented x 3 with fluent speech NEURO: no focal motor/sensory deficits SKIN:  no rashes or significant lesions  LABORATORY DATA:  I have reviewed the data as listed Lab Results  Component Value Date   WBC 22.7 (H) 06/03/2017   HGB 8.8 (L) 06/03/2017   HCT 26.5 (L) 06/03/2017   MCV 92.5 06/03/2017   PLT 81 (L) 06/03/2017   Recent Labs    05/15/17 0901 05/22/17 1332 06/02/17 0949  06/03/17 0200  NA 140 133* 137 140  K 3.6 4.1 3.8 4.0  CL 105 97* 103 106  CO2 _0 GLUCOSE 126* 154* 103* 106*  BUN 10 27* 8 7  CREATININE 1.02* 0.96 1.11* 0.96  CALCIUM 9.1 9.2 9.4 8.8*  GFRNONAA 58* >60 52* >60  GFRAA >60 >60 >60 >60  PROT 6.5  --  6.9 6.3*  ALBUMIN 4.0  --  4.2 3.9  AST 21  --  20 18  ALT 12*  --  12* 10*  ALKPHOS 115  --  151* 135*  BILITOT 0.4  --  0.7 0.8    RADIOGRAPHIC STUDIES: I have personally reviewed the radiological images as listed and agreed with the findings in the report. Dg Chest 2 View  Result Date: 06/02/2017 CLINICAL DATA:  Right-sided swelling in the neck.  Fever. EXAM: CHEST  2 VIEW COMPARISON:  Chest CT 12/05/2016 FINDINGS: Right jugular port is stable with the tip in the upper SVC. Both lungs are clear. Heart and mediastinum are within normal limits. Trachea is midline. No pleural effusions. No acute bone abnormality. IMPRESSION: No active cardiopulmonary disease. Stable appearance of the right chest Port-A-Cath. Electronically Signed   By: Markus Daft M.D.   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 Right side chest porta cath with new onset right neck swelling, low-grade fever. EXAM: CT NECK WITH CONTRAST TECHNIQUE: Multidetector CT imaging of the neck was performed using the standard protocol following the bolus administration of intravenous contrast. CONTRAST:  28m ISOVUE-300 IOPAMIDOL (ISOVUE-300) INJECTION 61% COMPARISON:  PET-CT 05/09/2017 and earlier. FINDINGS: Pharynx and larynx: Mild to moderate retropharyngeal space effusion. Mild-to-moderate right parapharyngeal space inflammatory stranding. Negative  left parapharyngeal space. Mild generalized edema in the mucosal space of the hypopharynx and larynx. Mild rightward mass effect on the lower pharynx and larynx. The nasopharynx and soft palate appear stable and within  normal limits. Salivary glands: Inflammatory stranding in the right submandibular space. The right submandibular gland appears to remain normal. Negative sublingual space and left submandibular space. Both parotid glands remain normal. Thyroid: Negative. Lymph nodes: Small hyperenhancing reactive appearing lymph nodes at the right level 2 and level 3 nodal stations. No enlarged or suspicious cervical lymph nodes. Vascular: Thrombosis of the right internal jugular vein. The right subclavian, right innominate vein and visible SVC remain patent, along with left innominate vein, but there is thrombosis of the right IJ from the angle of the mandible caudally to its confluence with the right subclavian vein. This is a segment of about 7 centimeters. New line right IJ approach porta cath is in place. There is no thrombus along the catheter below the level of right IJ clot. Beginning at the angle of the mandible where a retromandibular vein communicates with the right IJ vessel is patent, and patent to the skull base including the right IJ bulb and visible right sigmoid sinus. There is associated inflammatory stranding in an around the right carotid space, tracking to the areas stated above. The other major vascular structures in the neck and at the skull base are patent the left IJ, left subclavian vein, and left innominate vein are patent. Limited intracranial: Negative. Visualized orbits: Negative. Mastoids and visualized paranasal sinuses: Stable and well pneumatized. Skeleton: No acute or suspicious osseous lesion identified. Upper chest: Negative superior mediastinum. Negative visible lung parenchyma. No axillary lymphadenopathy identified. IMPRESSION: 1. Positive for right IJ thrombosis. A segment of about 7 cm of the vessel is thrombosed from the right thoracic inlet to the angle of the mandible. Associated inflammation in and around the right carotid space (see #3), and mild reactive right cervical lymphadenopathy.  2. The SVC, both innominate veins, and right subclavian vein remain patent. And in the upper neck and at the skull base the right IJ remains patent. The visible right sigmoid sinus is patent. Other major vascular structures in the mediastinum, neck, and at the skull base are patent. 3. Mild associated mass effect on the right pharynx with right parapharyngeal space inflammation and mild retropharyngeal space effusion. Critical Value/emergent results were called by telephone at the time of interpretation on 06/02/2017 at 1115 hours to Dr. Darel Hong , who verbally acknowledged these results. Electronically Signed   By: Genevie Ann M.D.   On: 06/02/2017 11:19   Nm Pet Image Restag (ps) Skull Base To Thigh  Result Date: 05/09/2017 CLINICAL DATA:  Subsequent treatment strategy for lymphoma. EXAM: NUCLEAR MEDICINE PET SKULL BASE TO THIGH TECHNIQUE: 12.8 mCi F-18 FDG was injected intravenously. Full-ring PET imaging was performed from the skull base to thigh after the radiotracer. CT data was obtained and used for attenuation correction and anatomic localization. FASTING BLOOD GLUCOSE:  Value: 88 mg/dl COMPARISON:  03/21/2017 FINDINGS: NECK: No hypermetabolic lymph nodes in the neck. CHEST: No hypermetabolic mediastinal or hilar nodes. Index right axillary node measures 0.9 cm and has an SUV max equal to 2.3. Previously 1.8 cm with an SUV max of 50.08. No suspicious pulmonary nodules on the CT scan. ABDOMEN/PELVIS: The spleen measures 10 by 4.8 by 13.4 cm (volume = 340 cm^3). The SUV max associated with the spleen is currently 4.16. Previously the spleen had a volume of 650 cm^3  and had an SUV max of 14.6. No focal areas of hypermetabolism within the spleen. No abnormal uptake within the liver or pancreas. The adrenal glands are unremarkable. No hypermetabolic or enlarged lymph nodes within the abdomen or pelvis. SKELETON: Diffuse radiotracer uptake is identified throughout the axial and appendicular skeleton. The  appearance is most compatible with treatment related changes. IMPRESSION: 1. Interval resolution of hypermetabolic right axillary adenopathy. 2. Decrease in volume and degree of FDG uptake associated with the spleen. 3. Diffuse radiotracer uptake throughout the axial and appendicular skeleton is identified which is favored to represent treatment related changes. This may obscure any underlying focal areas of increased uptake due to tumor. Electronically Signed   By: Kerby Moors M.D.   On: 05/09/2017 10:44   US Venous Img Upper Uni Right  Result Date: 06/02/2017 CLINICAL DATA:  Diffuse large B-cell lymphoma. New onset of right neck swelling. EXAM: RIGHT UPPER EXTREMITY VENOUS DOPPLER ULTRASOUND TECHNIQUE: Gray-scale sonography with graded compression, as well as color Doppler and duplex ultrasound were performed to evaluate the upper extremity deep venous system from the level of the subclavian vein and including the jugular, axillary, basilic, radial, ulnar and upper cephalic vein. Spectral Doppler was utilized to evaluate flow at rest and with distal augmentation maneuvers. COMPARISON:  Neck CT 06/02/2017 FINDINGS: Contralateral Subclavian Vein: No thrombus. Color Doppler flow and normal phasicity. Internal Jugular Vein: Echogenic thrombus in the right internal jugular vein without compressibility. Expansion of the right internal jugular vein suggesting acute or subacute thrombus. Port catheter not imaged on this examination. This right internal jugular vein thrombus is occlusive. Subclavian Vein: No evidence of thrombus. Normal compressibility and color Doppler flow in the right subclavian vein. Axillary Vein: No evidence of thrombus. Normal compressibility, respiratory phasicity and response to augmentation. Cephalic Vein: No evidence of thrombus. Normal compressibility, respiratory phasicity and response to augmentation. Basilic Vein: No evidence of thrombus. Normal compressibility, respiratory phasicity  and response to augmentation. Brachial Veins: No evidence of thrombus. Normal compressibility, respiratory phasicity and response to augmentation. Radial Veins: No evidence of thrombus. Normal compressibility and color Doppler flow. Ulnar Veins: No evidence of thrombus. Normal compressibility and color Doppler flow. Other Findings:  None visualized. IMPRESSION: Positive for occlusive thrombus in the right internal jugular vein. No other thrombus in the right upper extremity. Electronically Signed   By: Markus Daft M.D.   On: 06/02/2017 11:52   Ct Biopsy  Result Date: 05/29/2017 CLINICAL DATA:  Diffuse large B-cell lymphoma, relapsed, preop stem-cell transplant EXAM: CT GUIDED DEEP ILIAC BONE ASPIRATION AND CORE BIOPSY TECHNIQUE: The procedure, risks (including but not limited to bleeding, infection, organ damage ), benefits, and alternatives were explained to the patient. Questions regarding the procedure were encouraged and answered. The patient understands and consents to the procedure. Patient was placed supine on the CT gantry and limited axial scans through the pelvis were obtained. Appropriate skin entry site was identified. Skin site was marked, prepped with chlorhexidine, draped in usual sterile fashion, and infiltrated locally with 1% lidocaine. Intravenous Fentanyl and Versed were administered as conscious sedation during continuous monitoring of the patient's level of consciousness and physiological / cardiorespiratory status by the radiology RN, with a total moderate sedation time of 20 minutes. Under CT fluoroscopic guidance an 11-gauge Cook trocar bone needle was advanced into the right iliac bone just lateral to the sacroiliac joint. Once needle tip position was confirmed, core and aspiration samples were obtained, submitted to pathology for approval. Patient tolerated procedure well.  COMPLICATIONS: COMPLICATIONS none IMPRESSION: 1. Technically successful CT guided right iliac bone core and  aspiration biopsy. Electronically Signed   By: Lucrezia Europe M.D.   On: 05/29/2017 09:31    ASSESSMENT & PLAN:   #62 year old female patient history of relapsed diffuse large B-cell lymphoma currently status post salvage chemotherapy-currently admitted the hospital for right neck pain/swelling.  #Right neck pain/swelling-secondary to IJ DVT-likely secondary to port.  Patient currently on IV heparin.  I recommend explantation of the port given the extensive DVT.  Patient currently finished salvage chemotherapy for now; and port is not needed at this time.  Discussed with Dr. Lucky Cowboy, vascular.  I would recommend switching to Lovenox after the procedure-especially she is awaiting LP the next few weeks at [facility].   #Diffuse large B-cell lymphoma stage IV-relapsed-status post cycle #3 of R-GDP day #8; on 1/09.  Recent bone marrow biopsy; PET scan at [facility] evidence of disease.  Patient currently in complete response from her chemotherapy.  Patient should potentially be able to proceed with autologous stem cell transplant as planned second week of January 2019.   #Leukocytosis/low-grade temperature-patient received Neulasta approximately 10 days ago.  Fevers-question from DVT.  Will continue antibiotics for now.  Await cultures; if negative-recommend transition to short course of Levaquin for 5 days.  Thank you Dr. Earleen Newport for allowing me to participate in the care of your pleasant patient. Please do not hesitate to contact me with questions or concerns in the interim. I have spoke to Dr.Dew. I have also updated  Dr. Evette Doffing; transplant doctor at [facility].   All questions were answered. The patient knows to call the clinic with any problems, questions or concerns.    Cammie Sickle, MD 06/03/2017 1:36 PM

## 2017-06-03 NOTE — Op Note (Signed)
[facility]       Operative Note  Date: 06/03/2017  Preoperative diagnosis:  1.  Right jugular vein DVT, lymphoma no longer using port for chemotherapy  Postoperative diagnosis:  Same as above  Procedures: 1.  Removal of right jugular port a cath  2.  Right jugular venogram and superior venacavogram 3.  Percutaneous transluminal angioplasty of right jugular vein with 7 mm diameter by 6 cm length angioplasty balloon 4.  Mechanical thrombectomy to the right jugular vein using the penumbra cat 6 device  Surgeon: Leotis Pain, MD  Anesthesia: Local with moderate conscious sedation for 30 minutes using 3 mg of Versed and 100 Mcg of Fentanyl  Fluoroscopy time: 2.9 minutes  Contrast used: 20 cc  Estimated blood loss: 100 cc  Indication for the procedure:  The patient is a 62 y.o. female who has completed her therapy for her lymphoma and no longer needs their Port-A-Cath.  Also, she has a right jugular DVT which is significantly symptomatic.  The patient desires to have this removed.  Given her symptomatic jugular vein DVT, consideration for treatment of this thrombosis at the time of removal is also planned and she desires.  Risks and benefits including need for potential replacement with recurrent disease were discussed and patient is agreeable to proceed.  Description of procedure: The patient was brought to the vascular and interventional radiology suite. Moderate conscious sedation was administered during a face to face encounter with the patient throughout the procedure with my supervision of the RN administering medicines and monitoring the patient's vital signs, pulse oximetry, telemetry and mental status throughout from the start of the procedure until the patient was taken to the recovery room.  The right neck chest and shoulder were sterilely prepped and draped, and a sterile surgical field was created. The area was then  anesthetized with 1% lidocaine copiously. The previous incision was reopened and electrocautery used to dissected down to the port and the catheter. These were dissected free and the catheter was gently removed from the vein in its entirety. The port was dissected out from the fibrous connective tissue and the Prolene sutures were removed. The port was then removed disconnecting it from the catheter itself.  The catheter was used as our access point and a Magic torque wire was placed through the catheter portion and the catheter was removed.  A 6 French sheath was then placed with the tip just into the jugular vein and a jugular venogram and superior vena cavagram was performed.  This demonstrated thrombus with near occlusion of the jugular vein across the clavicle until the innominate vein and superior vena cava then normalized without thrombus or narrowing in the innominate vein or superior vena cava.  I then used a 7 mm diameter by 6 cm length angioplasty balloon to treat the near occlusion of the jugular vein.  This was inflated to 14 atm for 1 minute.  Completion venogram did demonstrate marked improvement in the lumen, but significant thrombus was now seen in the jugular vein.  Several passes were then performed with the penumbra cat 6 device over the Magic torque wire and through the 6 French sheath.  This resulted in improvement and removal of significant portions of the thrombus.  There was still mild to moderate thrombosis of the jugular vein following treatment, but the flow lumen was markedly improved.  I elected to terminate the procedure.  The wound was then closed with a 3-0 Vicryl and a 4-0  Monocryl and Dermabond was placed as a dressing. The patient was then taken to the recovery room in stable condition having tolerated the procedure well.  Complications: none  Condition: stable   Leotis Pain, MD 06/03/2017 4:22 PM   This note was created with Dragon Medical transcription system. Any  errors in dictation are purely unintentional.

## 2017-06-04 ENCOUNTER — Other Ambulatory Visit: Payer: Self-pay | Admitting: Internal Medicine

## 2017-06-04 ENCOUNTER — Encounter: Payer: Self-pay | Admitting: Vascular Surgery

## 2017-06-04 ENCOUNTER — Ambulatory Visit: Payer: BLUE CROSS/BLUE SHIELD | Admitting: Internal Medicine

## 2017-06-04 ENCOUNTER — Telehealth: Payer: Self-pay | Admitting: Internal Medicine

## 2017-06-04 ENCOUNTER — Inpatient Hospital Stay
Admit: 2017-06-04 | Discharge: 2017-06-04 | Disposition: A | Discharge status: Home / Self Care | Payer: BLUE CROSS/BLUE SHIELD | Attending: Internal Medicine | Admitting: Internal Medicine

## 2017-06-04 DIAGNOSIS — C8338 Diffuse large B-cell lymphoma, lymph nodes of multiple sites: Secondary | ICD-10-CM

## 2017-06-04 LAB — CBC WITH DIFFERENTIAL/PLATELET
Basophils Absolute: 0 10*3/uL (ref 0–0.1)
Basophils Relative: 0 %
EOS ABS: 0.1 10*3/uL (ref 0–0.7)
EOS PCT: 0 %
HCT: 24 % — ABNORMAL LOW (ref 35.0–47.0)
Hemoglobin: 7.9 g/dL — ABNORMAL LOW (ref 12.0–16.0)
LYMPHS ABS: 1.1 10*3/uL (ref 1.0–3.6)
Lymphocytes Relative: 5 %
MCH: 30.5 pg (ref 26.0–34.0)
MCHC: 33.1 g/dL (ref 32.0–36.0)
MCV: 92.3 fL (ref 80.0–100.0)
MONO ABS: 2 10*3/uL — AB (ref 0.2–0.9)
MONOS PCT: 9 %
Neutro Abs: 19.3 10*3/uL — ABNORMAL HIGH (ref 1.4–6.5)
Neutrophils Relative %: 86 %
PLATELETS: 117 10*3/uL — AB (ref 150–440)
RBC: 2.6 MIL/uL — ABNORMAL LOW (ref 3.80–5.20)
RDW: 23.7 % — AB (ref 11.5–14.5)
WBC: 22.5 10*3/uL — ABNORMAL HIGH (ref 3.6–11.0)

## 2017-06-04 LAB — ECHOCARDIOGRAM COMPLETE
HEIGHTINCHES: 62 in
WEIGHTICAEL: 2820.8 [oz_av]

## 2017-06-04 LAB — GLUCOSE, CAPILLARY: GLUCOSE-CAPILLARY: 94 mg/dL (ref 65–99)

## 2017-06-04 LAB — HEPARIN LEVEL (UNFRACTIONATED): HEPARIN UNFRACTIONATED: 0.58 [IU]/mL (ref 0.30–0.70)

## 2017-06-04 MED ORDER — ENOXAPARIN SODIUM 80 MG/0.8ML ~~LOC~~ SOLN
1.0000 mg/kg | Freq: Two times a day (BID) | SUBCUTANEOUS | Status: DC
  Administered 2017-06-04: 13:00:00 80 mg via SUBCUTANEOUS
  Filled 2017-06-04: qty 0.8

## 2017-06-04 MED ORDER — LEVOFLOXACIN 500 MG PO TABS: 500.0000 mg | ORAL_TABLET | Freq: Every day | ORAL | 0 refills | Status: DC

## 2017-06-04 MED ORDER — LEVOFLOXACIN 500 MG PO TABS
500.0000 mg | ORAL_TABLET | Freq: Every day | ORAL | Status: DC
  Administered 2017-06-04: 500 mg via ORAL
  Filled 2017-06-04: qty 1

## 2017-06-04 MED ORDER — ENOXAPARIN SODIUM 80 MG/0.8ML ~~LOC~~ SOLN: 1.0000 mg/kg | Freq: Two times a day (BID) | SUBCUTANEOUS | 0 refills | Status: DC

## 2017-06-04 NOTE — Plan of Care (Signed)
Alert and oriented patient. No complaints of pain or discomfort. Pt had thrombectomy and port removal done on 06/03/17. Liquid shin intact to incision and no drainage noted. Will continue to monitor. Possible discharge home 06/04/17.  Progressing Education: Knowledge of General Education information will improve 06/04/2017 0350 - Progressing by Cathie Hoops, RN Health Behavior/Discharge Planning: Ability to manage health-related needs will improve 06/04/2017 0350 - Progressing by Cathie Hoops, RN Clinical Measurements: Ability to maintain clinical measurements within normal limits will improve 06/04/2017 0350 - Progressing by Cathie Hoops, RN Will remain free from infection 06/04/2017 0350 - Progressing by Cathie Hoops, RN Diagnostic test results will improve 06/04/2017 0350 - Progressing by Cathie Hoops, RN Respiratory complications will improve 06/04/2017 0350 - Progressing by Cathie Hoops, RN Cardiovascular complication will be avoided 06/04/2017 0350 - Progressing by Cathie Hoops, RN

## 2017-06-04 NOTE — Telephone Encounter (Signed)
Orders have been entered 

## 2017-06-04 NOTE — Progress Notes (Signed)
Spoke to patient regarding plan-Lovenox discharge; abnormal blood counts elevated white count/anemia/low platelets-Neulasta/chemotherapy.  Patient on antibiotics; deferred to Dr. Earleen Newport re: discharge plan. Will order 2 d echo as per transplant planning. Discussed with Dr.Weiting.

## 2017-06-04 NOTE — Progress Notes (Signed)
Discharge instructions given and went over with patient and patients husband at bedside. Prescriptions given and reviewed. Lovenox teaching provided, handout and demonstration. Both patient and patients husband verbalized understanding. All questions answered. Patient discharged home with husband via wheelchair by nursing staff. Madlyn Frankel, RN

## 2017-06-04 NOTE — Progress Notes (Signed)
*  PRELIMINARY RESULTS* Echocardiogram 2D Echocardiogram has been performed.  Sherrie Sport 06/04/2017, 12:43 PM

## 2017-06-04 NOTE — Progress Notes (Signed)
ANTICOAGULATION CONSULT NOTE - Initial Consult  Pharmacy Consult for Enoxaparin (transition from Heparin drip) Indication: pulmonary embolus  No Known Allergies  Patient Measurements: Height: 5\' 2"  (157.5 cm) Weight: 176 lb 4.8 oz (80 kg) IBW/kg (Calculated) : 50.1 Heparin Dosing Weight:    Vital Signs: Temp: 98.5 F (36.9 C) (01/22 1102) Temp Source: Oral (01/22 1102) BP: 131/64 (01/22 0340) Pulse Rate: 101 (01/22 0340)  Labs: Recent Labs    06/02/17 0949  06/03/17 0200 06/03/17 2256 06/04/17 0531  HGB 9.5*  --  8.8*  --  7.9*  HCT 29.4*  --  26.5*  --  24.0*  PLT 61*  --  81*  --  117*  APTT 28  --   --   --   --   LABPROT 12.0  --   --   --   --   INR 0.89  --   --   --   --   HEPARINUNFRC  --    < > 0.61 0.51 0.58  CREATININE 1.11*  --  0.96  --   --    < > = values in this interval not displayed.    Estimated Creatinine Clearance: 59.6 mL/min (by C-G formula based on SCr of 0.96 mg/dL).   Medical History: Past Medical History:  Diagnosis Date  . Arthritis   . Blood dyscrasia   . Cancer ([place])   . Chest pain, unspecified   . Diverticulosis 07/06/15  . Dysrhythmia    tachycardia  . Esophagitis 07/06/15  . Fatigue   . Gastritis 07/06/15  . GERD (gastroesophageal reflux disease)   . 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 Leg cramps   . Lymphoma ([place]) 09/14/15   Monoclonal B cell lymphoma  . Night sweats   . Osteoporosis   . Overweight(278.02)    Obesity  . Palpitations   . Shortness of breath dyspnea   . Tachycardia   . Thrombocytopenia ([place])     Assessment: Pharmacy consulted to dose Lovenox (enoxaparin) for transitioning from Heparin Drip in this 62 year old woman being treated for pulmonary embolism of jugular vein. S/p thrombectomy of jugular vein 06/03/17.   Goal of Therapy:  Monitor platelets by anticoagulation protocol: Yes   Plan:  Lovenox 1 mg/kg (patient weight= 80 mg) subcutaneously Q12h. Give Lovenox within 1 hour after Heparin Drip  discontinued.   Larance Ratledge A 06/04/2017,11:18 AM

## 2017-06-04 NOTE — Discharge Summary (Signed)
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 Arthritis   . Blood dyscrasia   . Cancer ([place])   . Chest pain, unspecified   . Diverticulosis 07/06/15  . Dysrhythmia    tachycardia  . Esophagitis 07/06/15  . Fatigue   . Gastritis 07/06/15  . GERD (gastroesophageal reflux disease)   . 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 Leg cramps   . Lymphoma ([place]) 09/14/15   Monoclonal B cell lymphoma  . Night sweats   . Osteoporosis   . Overweight(278.02)    Obesity  . Palpitations   . Shortness of breath dyspnea   . Tachycardia   . Thrombocytopenia ([place])     HOSPITAL COURSE:   1.  DVT right jugular vein With neck pain on the right side.  Patient was initially started on heparin drip.  Patient was taken to the operating room by Dr. Lucky Cowboy for removal of Port-A-Cath and thrombectomy.  Patient was feeling well after the procedure.  Case discussed with Dr. Rogue Bussing oncology and he would recommend Lovenox injections upon going home because the patient will likely need a lumbar puncture in the future.  Nursing staff helped with teaching of this medication. 2.  Fever and CBC showing toxic granulation.  The patient was put on aggressive antibiotics with vancomycin and Zosyn.  Blood cultures were negative times 2 days.  White blood cell count was believed to be up secondary to  Neulasta.  Patient afebrile upon discharge.  Since the patient is immunocompromised I will put on Levaquin for 7 days.  Fever may have been secondary to the blood clot. 3.  History of monoclonal B-cell lymphoma 4.  History of esophagitis on H2 blocker 5.  Anemia of chronic disease.  Patient was given aggressive IV fluid hydration and had a procedure on the jugular vein.  Patient's hemoglobin upon discharge 7.9. 6.  Thrombocytopenia improved to 117 upon going home  DISCHARGE CONDITIONS:   Satisfactory  CONSULTS OBTAINED:  Treatment Team:  Cammie Sickle, MD Algernon Huxley, MD  DRUG ALLERGIES:  No Known Allergies  DISCHARGE MEDICATIONS:   Allergies as of 06/04/2017   No Known Allergies     Medication List    STOP taking these medications   dexamethasone 4 MG tablet Commonly known as:  DECADRON   prochlorperazine 10 MG tablet Commonly known as:  COMPAZINE   promethazine 25 MG tablet Commonly known as:  PHENERGAN     TAKE these medications   acyclovir 400 MG tablet Commonly known as:  ZOVIRAX Take 1 tablet (400 mg total) 2 (two) times daily by mouth. One pill a day [to prevent shingles]   CALCIUM PO Take 600 mg 2 (two) times daily by mouth. Notes to patient:  > Do Not take within 2 hours of Levaquin >   enoxaparin 80 MG/0.8ML  injection Commonly known as:  LOVENOX Inject 0.8 mLs (80 mg total) into the skin every 12 (twelve) hours.   GAVISCON PO Take by mouth.   Krill Oil 1000 MG Caps Take 1 capsule by mouth daily.   levofloxacin 500 MG tablet Commonly known as:  LEVAQUIN Take 1 tablet (500 mg total) by mouth daily. Notes to patient:  >  Do NOT take within 2 hours of Calcium tablet >   lidocaine-prilocaine cream Commonly known as:  EMLA Apply to affected area once   LORazepam 0.5 MG tablet Commonly known as:  ATIVAN Take 1 tablet (0.5 mg total) by mouth every 8 (eight) hours.   ondansetron 8 MG tablet Commonly known as:  ZOFRAN Take 1 tablet (8 mg  total) 2 (two) times daily as needed by mouth. Start on the 3rd day after cisplatin chemotherapy.   ranitidine 150 MG tablet Commonly known as:  ZANTAC Take 150 mg by mouth 2 (two) times daily.   TYLENOL 8 HOUR 650 MG CR tablet Generic drug:  acetaminophen Take 650 mg by mouth every 8 (eight) hours as needed for pain.   XIIDRA 5 % Soln Generic drug:  Lifitegrast 1 drop.        DISCHARGE INSTRUCTIONS:   Follow-up with Dr. Rogue Bussing 2 weeks  If you experience worsening of your admission symptoms, develop shortness of breath, life threatening emergency, suicidal or homicidal thoughts you must seek medical attention immediately by calling 911 or calling your MD immediately  if symptoms less severe.  You Must read complete instructions/literature along with all the possible adverse reactions/side effects for all the Medicines you take and that have been prescribed to you. Take any new Medicines after you have completely understood and accept all the possible adverse reactions/side effects.   Please note  You were cared for by a hospitalist during your hospital stay. If you have any questions about your discharge medications or the care you received while you were in the hospital after you are discharged, you can call the unit and asked to speak with the hospitalist on call if the hospitalist that took care of you is not available. Once you are discharged, your primary care physician will handle any further medical issues. Please note that NO REFILLS for any discharge medications will be authorized once you are discharged, as it is imperative that you return to your primary care physician (or establish a relationship with a primary care physician if you do not have one) for your aftercare needs so that they can reassess your need for medications and monitor your lab values.    Today   CHIEF COMPLAINT:   Chief Complaint  Patient presents with  . Neck Pain    HISTORY OF PRESENT  ILLNESS:  Stacy Murillo  is a 62 y.o. female came in with neck pain and found to have a DVT in the right internal jugular   VITAL SIGNS:  Blood pressure 131/64, pulse (!) 101, temperature 98.5 F (36.9 C), temperature source Oral, resp. rate 13, height 5\' 2"  (1.575 m), weight 80 kg (176 lb 4.8 oz), SpO2 98 %.    PHYSICAL EXAMINATION:  GENERAL:  62 y.o.-year-old patient lying in the bed with no acute distress.  EYES: Pupils equal, round, reactive to light and accommodation. No scleral icterus. Extraocular muscles intact.  HEENT: Head atraumatic, normocephalic. Oropharynx and nasopharynx clear.  NECK:  Supple, no jugular venous distention. No thyroid enlargement, no tenderness.  LUNGS: Normal breath sounds bilaterally, no wheezing, rales,rhonchi  or crepitation. No use of accessory muscles of respiration.  CARDIOVASCULAR: S1, S2 normal. No murmurs, rubs, or gallops.  ABDOMEN: Soft, non-tender, non-distended. Bowel sounds present. No organomegaly or mass.  EXTREMITIES: No pedal edema, cyanosis, or clubbing.  NEUROLOGIC: Cranial nerves II through XII are intact. Muscle strength 5/5 in all extremities. Sensation intact. Gait not checked.  PSYCHIATRIC: The patient is alert and oriented x 3.  SKIN: No obvious rash, lesion, or ulcer.   DATA REVIEW:   CBC Recent Labs  Lab 06/04/17 0531  WBC 22.5*  HGB 7.9*  HCT 24.0*  PLT 117*    Chemistries  Recent Labs  Lab 06/03/17 0200  NA 140  K 4.0  CL 106  CO2 24  GLUCOSE 106*  BUN 7  CREATININE 0.96  CALCIUM 8.8*  AST 18  ALT 10*  ALKPHOS 135*  BILITOT 0.8     Microbiology Results  Results for orders placed or performed during the hospital encounter of 06/02/17  Blood Culture (routine x 2)     Status: None (Preliminary result)   Collection Time: 06/02/17  9:40 AM  Result Value Ref Range Status   Specimen Description LEFT ANTECUBITAL  Final   Special Requests   Final    BOTTLES DRAWN AEROBIC AND ANAEROBIC Blood Culture results  may not be optimal due to an excessive volume of blood received in culture bottles   Culture   Final    NO GROWTH 2 DAYS Performed at [facility], [address], [place], [place] [address]    Report Status PENDING  Incomplete  Blood Culture (routine x 2)     Status: None (Preliminary result)   Collection Time: 06/02/17  9:49 AM  Result Value Ref Range Status   Specimen Description BLOOD LEFT HAND  Final   Special Requests   Final    BOTTLES DRAWN AEROBIC AND ANAEROBIC Blood Culture adequate volume   Culture   Final    NO GROWTH 2 DAYS Performed at [facility], [address] [place], [place] [address]    Report Status PENDING  Incomplete       Management plans discussed with the patient, family and they are in agreement.  CODE STATUS:  Code Status History    Date Active Date Inactive Code Status Order ID Comments User Context   06/02/2017 12:48 06/04/2017 17:02 Full Code [id number]  Idelle Crouch, MD ED   03/19/2016 09:30 03/23/2016 20:45 Full Code [id number]  Cammie Sickle, MD Inpatient   02/27/2016 11:54 03/03/2016 17:03 Full Code [id number]  Cammie Sickle, MD Inpatient   01/23/2016 10:53 01/28/2016 14:16 Full Code [id number]  Cammie Sickle, MD Inpatient   12/05/2015 12:23 12/10/2015 19:37 Full Code [id number]  Cammie Sickle, MD Inpatient   10/19/2015 21:33 10/20/2015 16:54 Full Code [id number]  Fritzi Mandes, MD Inpatient    Advance Directive Documentation     Most Recent Value  Type of Advance Directive  Healthcare Power of Attorney, Living will  Pre-existing out of facility DNR order (yellow form or pink MOST form)  No data  "MOST" Form in Place?  No data      TOTAL TIME TAKING CARE OF THIS PATIENT: 35 minutes.    Loletha Grayer M.D on 06/04/2017 at 5:31 PM  Between 7am to 6pm - Pager - [phone]  After 6pm go to [email or web address] - password [email or web address]  [employer] Office  [phone]  CC: Primary care physician;  Crecencio Mc, MD

## 2017-06-04 NOTE — Telephone Encounter (Signed)
Please have pt come on 1/25 for cbc/hold tube- LAB only; follow up with me based on her labs from that day.

## 2017-06-05 ENCOUNTER — Inpatient Hospital Stay: Payer: BLUE CROSS/BLUE SHIELD | Admitting: Internal Medicine

## 2017-06-05 ENCOUNTER — Inpatient Hospital Stay: Payer: BLUE CROSS/BLUE SHIELD

## 2017-06-05 ENCOUNTER — Ambulatory Visit: Payer: BLUE CROSS/BLUE SHIELD

## 2017-06-07 ENCOUNTER — Inpatient Hospital Stay: Payer: BLUE CROSS/BLUE SHIELD

## 2017-06-07 ENCOUNTER — Telehealth (INDEPENDENT_AMBULATORY_CARE_PROVIDER_SITE_OTHER): Payer: Self-pay

## 2017-06-07 ENCOUNTER — Telehealth: Payer: Self-pay | Admitting: Internal Medicine

## 2017-06-07 DIAGNOSIS — M1990 Unspecified osteoarthritis, unspecified site: Secondary | ICD-10-CM | POA: Diagnosis not present

## 2017-06-07 DIAGNOSIS — Z86718 Personal history of other venous thrombosis and embolism: Secondary | ICD-10-CM | POA: Diagnosis not present

## 2017-06-07 DIAGNOSIS — K5790 Diverticulosis of intestine, part unspecified, without perforation or abscess without bleeding: Secondary | ICD-10-CM | POA: Diagnosis not present

## 2017-06-07 DIAGNOSIS — E669 Obesity, unspecified: Secondary | ICD-10-CM | POA: Diagnosis not present

## 2017-06-07 DIAGNOSIS — Z7901 Long term (current) use of anticoagulants: Secondary | ICD-10-CM | POA: Diagnosis not present

## 2017-06-07 DIAGNOSIS — C8338 Diffuse large B-cell lymphoma, lymph nodes of multiple sites: Secondary | ICD-10-CM

## 2017-06-07 DIAGNOSIS — Z79899 Other long term (current) drug therapy: Secondary | ICD-10-CM | POA: Diagnosis not present

## 2017-06-07 DIAGNOSIS — R161 Splenomegaly, not elsewhere classified: Secondary | ICD-10-CM | POA: Diagnosis not present

## 2017-06-07 DIAGNOSIS — K5732 Diverticulitis of large intestine without perforation or abscess without bleeding: Secondary | ICD-10-CM | POA: Diagnosis not present

## 2017-06-07 DIAGNOSIS — C7951 Secondary malignant neoplasm of bone: Secondary | ICD-10-CM | POA: Diagnosis not present

## 2017-06-07 DIAGNOSIS — Z5111 Encounter for antineoplastic chemotherapy: Secondary | ICD-10-CM | POA: Diagnosis not present

## 2017-06-07 DIAGNOSIS — K219 Gastro-esophageal reflux disease without esophagitis: Secondary | ICD-10-CM | POA: Diagnosis not present

## 2017-06-07 DIAGNOSIS — R740 Nonspecific elevation of levels of transaminase and lactic acid dehydrogenase [LDH]: Secondary | ICD-10-CM | POA: Diagnosis not present

## 2017-06-07 DIAGNOSIS — Z923 Personal history of irradiation: Secondary | ICD-10-CM | POA: Diagnosis not present

## 2017-06-07 LAB — SAMPLE TO BLOOD BANK

## 2017-06-07 LAB — CBC WITH DIFFERENTIAL/PLATELET
BAND NEUTROPHILS: 2 %
BLASTS: 0 %
Basophils Absolute: 0 10*3/uL (ref 0–0.1)
Basophils Relative: 0 %
EOS ABS: 0.6 10*3/uL (ref 0–0.7)
Eosinophils Relative: 3 %
HCT: 24.9 % — ABNORMAL LOW (ref 35.0–47.0)
HEMOGLOBIN: 8.3 g/dL — AB (ref 12.0–16.0)
Lymphocytes Relative: 5 %
Lymphs Abs: 1 10*3/uL (ref 1.0–3.6)
MCH: 31.1 pg (ref 26.0–34.0)
MCHC: 33.4 g/dL (ref 32.0–36.0)
MCV: 93 fL (ref 80.0–100.0)
MYELOCYTES: 0 %
Metamyelocytes Relative: 0 %
Monocytes Absolute: 1.4 10*3/uL — ABNORMAL HIGH (ref 0.2–0.9)
Monocytes Relative: 7 %
NEUTROS ABS: 16.8 10*3/uL — AB (ref 1.4–6.5)
Neutrophils Relative %: 83 %
Other: 0 %
PROMYELOCYTES ABS: 0 %
Platelets: 256 10*3/uL (ref 150–440)
RBC: 2.68 MIL/uL — ABNORMAL LOW (ref 3.80–5.20)
RDW: 22.9 % — ABNORMAL HIGH (ref 11.5–14.5)
SMEAR REVIEW: ADEQUATE
WBC: 19.8 10*3/uL — AB (ref 3.6–11.0)
nRBC: 0 /100 WBC

## 2017-06-07 LAB — COMPREHENSIVE METABOLIC PANEL
ALT: 7 U/L — ABNORMAL LOW (ref 14–54)
AST: 15 U/L (ref 15–41)
Albumin: 3.8 g/dL (ref 3.5–5.0)
Alkaline Phosphatase: 109 U/L (ref 38–126)
Anion gap: 9 (ref 5–15)
BUN: 7 mg/dL (ref 6–20)
CHLORIDE: 103 mmol/L (ref 101–111)
CO2: 27 mmol/L (ref 22–32)
Calcium: 9.3 mg/dL (ref 8.9–10.3)
Creatinine, Ser: 1.04 mg/dL — ABNORMAL HIGH (ref 0.44–1.00)
GFR calc Af Amer: >60 mL/min (ref >60)
GFR, EST NON AFRICAN AMERICAN: 56 mL/min — AB (ref >60)
Glucose, Bld: 101 mg/dL — ABNORMAL HIGH (ref 65–99)
POTASSIUM: 4.2 mmol/L (ref 3.5–5.1)
SODIUM: 139 mmol/L (ref 135–145)
Total Bilirubin: 0.4 mg/dL (ref 0.3–1.2)
Total Protein: 6.6 g/dL (ref 6.5–8.1)

## 2017-06-07 LAB — CULTURE, BLOOD (ROUTINE X 2)
CULTURE: NO GROWTH
Culture: NO GROWTH
Special Requests: ADEQUATE

## 2017-06-07 LAB — LACTATE DEHYDROGENASE: LDH: 200 U/L — AB (ref 98–192)

## 2017-06-07 LAB — MAGNESIUM: MAGNESIUM: 1.6 mg/dL — AB (ref 1.7–2.4)

## 2017-06-07 NOTE — Telephone Encounter (Signed)
Called the patients husband back to let him know that the symptoms that she is having are normal and that if the headaches continue, that she should follow-up with her primary care doctor in order to resolve the headache issue.

## 2017-06-07 NOTE — Telephone Encounter (Signed)
The patient underwent removal of right jugular port a cath, right jugular venogram and superior venacavogram, percutaneous transluminal angioplasty of right jugular vein with 7 mm diameter by 6 cm length angioplasty balloon, mechanical thrombectomy to the right jugular vein using the penumbra cat 6 device.  Soreness and bruising at the site is to be expected.  If the patient continues to have headaches she should really seek medical attention with her primary care/urgent care.

## 2017-06-07 NOTE — Telephone Encounter (Signed)
Patient is having pain in her head and she is really bruised and lots of soreness.   The patient's husband would like to know if this is normal or to be expected?   The surgery was done on Monday.

## 2017-06-07 NOTE — Telephone Encounter (Signed)
H/B- Please inform patient that-her labs look okay-white count is elevated from the last/but improving; hemoglobin-low but stable from recent chemotherapy.   # Please also inform the 2D echo is normal; and this will be faxed to [facility] as recommended. Please FAX to [facility] to email]  # Follow-up with [place] will be open with as for now; because she will be following up with [facility] next week in anticipation of transplant.

## 2017-06-07 NOTE — Telephone Encounter (Signed)
Patient notified of Dr. Aletha Halim comments and verbalized understanding.

## 2017-06-11 ENCOUNTER — Telehealth: Payer: Self-pay | Admitting: *Deleted

## 2017-06-11 NOTE — Telephone Encounter (Signed)
-----   Message from Shawnee Knapp, RN sent at 06/11/2017  3:31 PM EST ----- Regarding: Echocardiogram Patient called to inquire about echo results being faxed to [facility].  Unable to determine the status from patient chart.

## 2017-06-11 NOTE — Telephone Encounter (Signed)
Contacted patient. Reassured pt that echo results were sent today to Dr. Evette Doffing in c/o Erich Montane at [facility] via secure email. Also explained that [facility] should be able to review these in careeverywehre. She thanked me for returning her phone call.

## 2017-06-12 ENCOUNTER — Ambulatory Visit: Payer: BLUE CROSS/BLUE SHIELD

## 2017-06-12 ENCOUNTER — Other Ambulatory Visit: Payer: BLUE CROSS/BLUE SHIELD

## 2017-06-13 ENCOUNTER — Inpatient Hospital Stay: Payer: BLUE CROSS/BLUE SHIELD | Admitting: Internal Medicine

## 2017-06-13 ENCOUNTER — Encounter: Payer: Self-pay | Admitting: Internal Medicine

## 2017-06-18 ENCOUNTER — Encounter (HOSPITAL_COMMUNITY): Payer: Self-pay

## 2017-06-19 LAB — CHROMOSOME ANALYSIS, BONE MARROW

## 2017-06-20 ENCOUNTER — Other Ambulatory Visit: Payer: Self-pay

## 2017-06-20 ENCOUNTER — Encounter: Payer: Self-pay | Admitting: Emergency Medicine

## 2017-06-20 ENCOUNTER — Emergency Department
Admission: EM | Admit: 2017-06-20 | Discharge: 2017-06-20 | Disposition: A | Discharge status: Home / Self Care | Payer: BLUE CROSS/BLUE SHIELD | Attending: Emergency Medicine | Admitting: Emergency Medicine

## 2017-06-20 DIAGNOSIS — R1010 Upper abdominal pain, unspecified: Secondary | ICD-10-CM | POA: Diagnosis present

## 2017-06-20 DIAGNOSIS — Z5321 Procedure and treatment not carried out due to patient leaving prior to being seen by health care provider: Secondary | ICD-10-CM | POA: Diagnosis not present

## 2017-06-20 NOTE — ED Notes (Signed)
Pt states she was seen yesterday at [facility] and blood work was done. Pt refusing blood work to be done at this time.

## 2017-06-20 NOTE — ED Triage Notes (Addendum)
Pt to triage via w/c with no distress noted, mask in place; pt reports mid upper abd pain x hour; pt reports receiving oxycodone today for cath placement for future stem cells and believes it "reacted with her"; st hx of muscle spasms in bile duct and feels same

## 2017-06-26 ENCOUNTER — Encounter: Payer: Self-pay | Admitting: Internal Medicine

## 2017-07-02 ENCOUNTER — Other Ambulatory Visit: Payer: Self-pay | Admitting: Internal Medicine

## 2017-08-06 ENCOUNTER — Encounter: Payer: Self-pay | Admitting: Internal Medicine

## 2017-08-15 ENCOUNTER — Telehealth: Payer: Self-pay | Admitting: *Deleted

## 2017-08-15 DIAGNOSIS — C8338 Diffuse large B-cell lymphoma, lymph nodes of multiple sites: Secondary | ICD-10-CM

## 2017-08-15 NOTE — Telephone Encounter (Signed)
Faxed Orders received from [facility]- per Dr. Vennie Homans. Patient will need a cbc, metc, mag on 08/28/17

## 2017-08-15 NOTE — Telephone Encounter (Signed)
Dr. Jacinto Reap, I received phone call from [facility] Transplant Team.  [facility]- needs labs drawn[facility] faxing orders to attn. Stacy Murillo.). [facility] asked that I contact the patient with this lab apt. Right now, pt only needs a lab apt and does not need to see Dr. Rogue Bussing at this time.  I contacted Stacy Murillo. She is doing well. I provided her with a lab apt (per [facility] request) for 08/28/17 at 9 am.  Patient will call our office back or send a my chart msg if the plan of care changes for any reason. She is scheduled to have a pet scan. She believes [facility] is setting this pet scan up for her.

## 2017-08-16 NOTE — Addendum Note (Signed)
Addended by: Renita Papa R on: 08/16/2017 12:07 PM   Modules accepted: Orders

## 2017-08-16 NOTE — Telephone Encounter (Signed)
Spoke with Dr. Jacinto Reap. He agreed. He does not need to see the patient on 4/17. Lab only. Dr. B Agreed to orders labs as directed by Dr. Vennie Homans

## 2017-08-28 ENCOUNTER — Inpatient Hospital Stay: Payer: BLUE CROSS/BLUE SHIELD | Attending: Internal Medicine

## 2017-08-28 DIAGNOSIS — Z79899 Other long term (current) drug therapy: Secondary | ICD-10-CM | POA: Diagnosis not present

## 2017-08-28 DIAGNOSIS — R161 Splenomegaly, not elsewhere classified: Secondary | ICD-10-CM | POA: Insufficient documentation

## 2017-08-28 DIAGNOSIS — Z86718 Personal history of other venous thrombosis and embolism: Secondary | ICD-10-CM | POA: Diagnosis not present

## 2017-08-28 DIAGNOSIS — K5790 Diverticulosis of intestine, part unspecified, without perforation or abscess without bleeding: Secondary | ICD-10-CM | POA: Diagnosis not present

## 2017-08-28 DIAGNOSIS — M1990 Unspecified osteoarthritis, unspecified site: Secondary | ICD-10-CM | POA: Diagnosis not present

## 2017-08-28 DIAGNOSIS — C8338 Diffuse large B-cell lymphoma, lymph nodes of multiple sites: Secondary | ICD-10-CM | POA: Insufficient documentation

## 2017-08-28 DIAGNOSIS — Z923 Personal history of irradiation: Secondary | ICD-10-CM | POA: Diagnosis not present

## 2017-08-28 DIAGNOSIS — K5732 Diverticulitis of large intestine without perforation or abscess without bleeding: Secondary | ICD-10-CM | POA: Insufficient documentation

## 2017-08-28 DIAGNOSIS — C7951 Secondary malignant neoplasm of bone: Secondary | ICD-10-CM | POA: Diagnosis not present

## 2017-08-28 DIAGNOSIS — Z7901 Long term (current) use of anticoagulants: Secondary | ICD-10-CM | POA: Insufficient documentation

## 2017-08-28 DIAGNOSIS — R740 Nonspecific elevation of levels of transaminase and lactic acid dehydrogenase [LDH]: Secondary | ICD-10-CM | POA: Diagnosis not present

## 2017-08-28 DIAGNOSIS — E669 Obesity, unspecified: Secondary | ICD-10-CM | POA: Diagnosis not present

## 2017-08-28 DIAGNOSIS — K219 Gastro-esophageal reflux disease without esophagitis: Secondary | ICD-10-CM | POA: Diagnosis not present

## 2017-08-28 LAB — COMPREHENSIVE METABOLIC PANEL
ALBUMIN: 4.3 g/dL (ref 3.5–5.0)
ALT: 14 U/L (ref 14–54)
AST: 20 U/L (ref 15–41)
Alkaline Phosphatase: 47 U/L (ref 38–126)
Anion gap: 8 (ref 5–15)
BUN: 16 mg/dL (ref 6–20)
CHLORIDE: 106 mmol/L (ref 101–111)
CO2: 26 mmol/L (ref 22–32)
Calcium: 9.9 mg/dL (ref 8.9–10.3)
Creatinine, Ser: 1.16 mg/dL — ABNORMAL HIGH (ref 0.44–1.00)
GFR calc Af Amer: 57 mL/min — ABNORMAL LOW (ref >60)
GFR calc non Af Amer: 49 mL/min — ABNORMAL LOW (ref >60)
GLUCOSE: 107 mg/dL — AB (ref 65–99)
Potassium: 5 mmol/L (ref 3.5–5.1)
Sodium: 140 mmol/L (ref 135–145)
Total Bilirubin: 0.6 mg/dL (ref 0.3–1.2)
Total Protein: 6 g/dL — ABNORMAL LOW (ref 6.5–8.1)

## 2017-08-28 LAB — CBC WITH DIFFERENTIAL/PLATELET
BASOS PCT: 1 %
Basophils Absolute: 0 10*3/uL (ref 0–0.1)
Eosinophils Absolute: 0.1 10*3/uL (ref 0–0.7)
Eosinophils Relative: 2 %
HCT: 33.9 % — ABNORMAL LOW (ref 35.0–47.0)
Hemoglobin: 11.8 g/dL — ABNORMAL LOW (ref 12.0–16.0)
LYMPHS ABS: 0.5 10*3/uL — AB (ref 1.0–3.6)
Lymphocytes Relative: 14 %
MCH: 32.5 pg (ref 26.0–34.0)
MCHC: 34.8 g/dL (ref 32.0–36.0)
MCV: 93.4 fL (ref 80.0–100.0)
Monocytes Absolute: 0.4 10*3/uL (ref 0.2–0.9)
Monocytes Relative: 10 %
Neutro Abs: 2.7 10*3/uL (ref 1.4–6.5)
Neutrophils Relative %: 73 %
PLATELETS: 116 10*3/uL — AB (ref 150–440)
RBC: 3.63 MIL/uL — AB (ref 3.80–5.20)
RDW: 16.7 % — ABNORMAL HIGH (ref 11.5–14.5)
WBC: 3.7 10*3/uL (ref 3.6–11.0)

## 2017-08-28 LAB — MAGNESIUM: MAGNESIUM: 1.8 mg/dL (ref 1.7–2.4)

## 2017-08-28 MED ORDER — SODIUM CHLORIDE 0.9% FLUSH
10.0000 mL | Freq: Once | INTRAVENOUS | Status: DC
  Filled 2017-08-28: qty 10

## 2017-08-28 MED ORDER — HEPARIN SOD (PORK) LOCK FLUSH 100 UNIT/ML IV SOLN: 500.0000 [IU] | Freq: Once | INTRAVENOUS | Status: DC

## 2017-09-12 ENCOUNTER — Encounter: Payer: Self-pay | Admitting: Internal Medicine

## 2017-09-12 DIAGNOSIS — C8338 Diffuse large B-cell lymphoma, lymph nodes of multiple sites: Secondary | ICD-10-CM

## 2017-09-12 NOTE — Telephone Encounter (Signed)
Spoke with Dr. Rogue Bussing regarding patient's mychart msg.  Dr. Rogue Bussing would like to see the patient on 09/25/17 with Labs- cbc, metc, mag. I have added the labs per md order. Patient contacted with this apt via telephone by Jordan Hawks- scheduler.

## 2017-09-25 ENCOUNTER — Inpatient Hospital Stay: Payer: BLUE CROSS/BLUE SHIELD | Attending: Internal Medicine

## 2017-09-25 ENCOUNTER — Inpatient Hospital Stay (HOSPITAL_BASED_OUTPATIENT_CLINIC_OR_DEPARTMENT_OTHER): Payer: BLUE CROSS/BLUE SHIELD | Admitting: Internal Medicine

## 2017-09-25 ENCOUNTER — Encounter: Payer: Self-pay | Admitting: Internal Medicine

## 2017-09-25 VITALS — BP 106/74 | HR 91 | Temp 97.8°F | Resp 16 | Wt 156.6 lb

## 2017-09-25 DIAGNOSIS — H532 Diplopia: Secondary | ICD-10-CM | POA: Insufficient documentation

## 2017-09-25 DIAGNOSIS — Z9221 Personal history of antineoplastic chemotherapy: Secondary | ICD-10-CM | POA: Diagnosis not present

## 2017-09-25 DIAGNOSIS — Z9484 Stem cells transplant status: Secondary | ICD-10-CM | POA: Insufficient documentation

## 2017-09-25 DIAGNOSIS — C8338 Diffuse large B-cell lymphoma, lymph nodes of multiple sites: Secondary | ICD-10-CM

## 2017-09-25 LAB — CBC WITH DIFFERENTIAL/PLATELET
BASOS ABS: 0 10*3/uL (ref 0–0.1)
BASOS PCT: 1 %
Eosinophils Absolute: 0.1 10*3/uL (ref 0–0.7)
Eosinophils Relative: 2 %
HEMATOCRIT: 30.3 % — AB (ref 35.0–47.0)
Hemoglobin: 10.9 g/dL — ABNORMAL LOW (ref 12.0–16.0)
Lymphocytes Relative: 14 %
Lymphs Abs: 0.5 10*3/uL — ABNORMAL LOW (ref 1.0–3.6)
MCH: 33.7 pg (ref 26.0–34.0)
MCHC: 35.9 g/dL (ref 32.0–36.0)
MCV: 93.9 fL (ref 80.0–100.0)
MONO ABS: 0.5 10*3/uL (ref 0.2–0.9)
Monocytes Relative: 13 %
NEUTROS ABS: 2.7 10*3/uL (ref 1.4–6.5)
Neutrophils Relative %: 70 %
PLATELETS: 118 10*3/uL — AB (ref 150–440)
RBC: 3.23 MIL/uL — ABNORMAL LOW (ref 3.80–5.20)
RDW: 14.3 % (ref 11.5–14.5)
WBC: 3.8 10*3/uL (ref 3.6–11.0)

## 2017-09-25 LAB — COMPREHENSIVE METABOLIC PANEL
ALBUMIN: 4.6 g/dL (ref 3.5–5.0)
ALK PHOS: 46 U/L (ref 38–126)
ALT: 15 U/L (ref 14–54)
ANION GAP: 10 (ref 5–15)
AST: 22 U/L (ref 15–41)
BILIRUBIN TOTAL: 1.2 mg/dL (ref 0.3–1.2)
BUN: 24 mg/dL — ABNORMAL HIGH (ref 6–20)
CALCIUM: 9.8 mg/dL (ref 8.9–10.3)
CO2: 23 mmol/L (ref 22–32)
Chloride: 104 mmol/L (ref 101–111)
Creatinine, Ser: 1.27 mg/dL — ABNORMAL HIGH (ref 0.44–1.00)
GFR, EST AFRICAN AMERICAN: 51 mL/min — AB (ref >60)
GFR, EST NON AFRICAN AMERICAN: 44 mL/min — AB (ref >60)
GLUCOSE: 110 mg/dL — AB (ref 65–99)
POTASSIUM: 4.4 mmol/L (ref 3.5–5.1)
Sodium: 137 mmol/L (ref 135–145)
TOTAL PROTEIN: 6.4 g/dL — AB (ref 6.5–8.1)

## 2017-09-25 LAB — MAGNESIUM: Magnesium: 1.7 mg/dL (ref 1.7–2.4)

## 2017-09-25 NOTE — Progress Notes (Signed)
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 CT- 1-2 CM LN subpectoral/medistinal/ retro-peritoneal/pelvic/ Right inguinal LN 1.6cm- Bx- DLBCL- ABC; myc-POS; FISH gene re-arragement-NEG. PET- MULTIPLE LN/ Bone involvement; R-CHOP x6- CR'# OCT 2017- PET scan- CR; DEC 22nd 2017- BMBx- "atypical large B cells- <1%  [[facility]; II opinion- Dr.Grover- review of path- not concerning for residual lymphoma]; recommend surviellance  # March 26th  2018PET- NED  # Lumbar puncture- difficult-spinal headache. S/p high dose MXT x4.  ------------------------------------------------------------------- # NOV 2018-recurrent diffuse large B-cell lymphoma [right axilla lymph node biopsy proven]; PET- multiple bone lesions; axillary adenopathy; splenomegaly uptake  # Nov 16th 2018- R-; s/p 3 cycles-CR' Auto-Stem cell Transplant date:07/11/17 [facility]; Dr.Vincent]; May 1st 2019- PET CR         ---------------------------------------------------------- # June 2017-Elevated LFTs- valacylovir/diflucan; resolved.   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   # May 2017- EF- 55-65%   # port flush     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)    Diffuse large B-cell lymphoma of lymph nodes of multiple regions [facility])      INTERVAL HISTORY:  Stacy Murillo 62 y.o.  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  Patient complains of mild fatigue.  Denies any swelling of the legs.  Complains of double vision for the last 1 month; mild blurry vision; no falls.  Denies any headaches.  Denies any neck pain.  She has been evaluated by optometry recently.   She noted to have a " mole"-mid back.  She is awaiting evaluation with dermatology this afternoon   Review of Systems  Constitutional: Positive for malaise/fatigue. Negative for chills, diaphoresis, fever and weight loss.  HENT: Negative for nosebleeds and sore throat.   Eyes: Negative for double vision.  Respiratory: Negative for cough, hemoptysis, sputum production, shortness of breath and wheezing.   Cardiovascular: Negative for chest pain, palpitations, orthopnea and leg swelling.  Gastrointestinal: Negative for abdominal pain, blood in stool, constipation, diarrhea, heartburn, melena, nausea and vomiting.  Genitourinary: Negative for dysuria, frequency and urgency.  Musculoskeletal: Negative for back pain and joint pain.  Skin: Negative.  Negative for itching and rash.  Neurological: Negative for dizziness, tingling, focal weakness, weakness and headaches.  Endo/Heme/Allergies: Does not bruise/bleed easily.  Psychiatric/Behavioral: Negative for depression. The patient is nervous/anxious. The patient does not have insomnia.       PAST MEDICAL HISTORY :  Past Medical History:  Diagnosis Date  . Arthritis   . Blood dyscrasia   . Cancer ([place])   . Chest pain, unspecified   . Diverticulosis 07/06/15  . Dysrhythmia    tachycardia  . Esophagitis 07/06/15  . Fatigue   . Gastritis 07/06/15  .  GERD (gastroesophageal reflux disease)   .  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 Leg cramps   . Lymphoma ([place]) 09/14/15   Monoclonal B cell lymphoma  . Night sweats   . Osteoporosis   . Overweight(278.02)    Obesity  . Palpitations   . Shortness of breath dyspnea   . Tachycardia   . Thrombocytopenia ([place])     PAST SURGICAL HISTORY :   Past Surgical History:  Procedure Laterality Date  . ABDOMINAL HYSTERECTOMY  2005  . BONE MARROW BIOPSY  09/14/15  . CHOLECYSTECTOMY  1997  . COLONOSCOPY  07/05/2014  . ESOPHAGOGASTRODUODENOSCOPY  07/05/2014  . INGUINAL LYMPH NODE BIOPSY Right 10/05/2015   Procedure: INGUINAL LYMPH NODE BIOPSY;  Surgeon: Christene Lye, MD;  Location: [facility] ORS;  Service: General;  Laterality: Right;  . PERIPHERAL VASCULAR CATHETERIZATION N/A 09/27/2015   Procedure: Glori Luis Cath Insertion;  Surgeon: Algernon Huxley, MD;  Location: [place] CV LAB;  Service: Cardiovascular;  Laterality: N/A;  . PORTA CATH REMOVAL Right 06/03/2017   Procedure: PORTA CATH REMOVAL, with venous thrombectomy;  Surgeon: Algernon Huxley, MD;  Location: [place] CV LAB;  Service: Cardiovascular;  Laterality: Right;  . PORTACATH PLACEMENT Right     FAMILY HISTORY :   Family History  Problem Relation Age of Onset  . Heart disease Father        CABG x 4  . Multiple myeloma Father   . Hypertension Mother   . Pancreatic cancer Mother   . Ulcers Mother   . Asthma Mother   . Brain cancer Maternal Uncle   . Multiple myeloma Maternal Uncle   . Diabetes Neg Hx   . Breast cancer Neg Hx     SOCIAL HISTORY:   Social History   Tobacco Use  . Smoking status: Never Smoker  . Smokeless tobacco: Never Used  Substance Use Topics  . Alcohol use: No    Alcohol/week: 0.0 oz  . Drug use: No    ALLERGIES:  is allergic to ondansetron hcl; oxycodone; and rituximab.  MEDICATIONS:  Current Outpatient Medications  Medication Sig Dispense Refill  . acetaminophen (TYLENOL 8 HOUR) 650 MG CR tablet Take 650 mg by mouth every 8 (eight) hours as  needed for pain.    Marland Kitchen acyclovir (ZOVIRAX) 400 MG tablet TAKE 1 TABLET BY MOUTH TWICE DAILY. TAKE1 TABLET A DAY (TO PREVENT SHINGLES) 60 tablet 3  . Alum Hydroxide-Mag Carbonate (GAVISCON PO) Take by mouth.    Marland Kitchen CALCIUM PO Take 600 mg 2 (two) times daily by mouth.     Javier Docker Oil 1000 MG CAPS Take 1 capsule by mouth daily.     Marland Kitchen lidocaine-prilocaine (EMLA) cream Apply to affected area once 30 g 3  . LORazepam (ATIVAN) 0.5 MG tablet Take 1 tablet (0.5 mg total) by mouth every 8 (eight) hours. 30 tablet 0  . ondansetron (ZOFRAN) 8 MG tablet Take 1 tablet (8 mg total) 2 (two) times daily as needed by mouth. Start on the 3rd day after cisplatin chemotherapy. 30 tablet 1  . ranitidine (ZANTAC) 150 MG tablet Take 150 mg by mouth 2 (two) times daily.    Marland Kitchen XIIDRA 5 % SOLN 1 drop.   4   No current facility-administered medications for this visit.    Facility-Administered Medications Ordered in Other Visits  Medication Dose Route Frequency Provider Last Rate Last Dose  . 0.9 %  sodium chloride infusion   Intravenous Continuous Herring, Orville Govern, NP   Stopped  at 10/14/15 1312  . methotrexate (50 mg/ml) 6.3 g in sodium chloride 0.9 % 1,000 mL injection   Intravenous Once Charlaine Dalton R, MD      . sodium chloride flush (NS) 0.9 % injection 10 mL  10 mL Intravenous PRN Cammie Sickle, MD   10 mL at 10/05/16 1400    PHYSICAL EXAMINATION: ECOG PERFORMANCE STATUS: 1 - Symptomatic but completely ambulatory  BP 106/74 (BP Location: Left Arm, Patient Position: Sitting)   Pulse 91   Temp 97.8 F (36.6 C) (Tympanic)   Resp 16   Wt 156 lb 9.6 oz (71 kg)   BMI 28.64 kg/m   Filed Weights   09/25/17 1136 09/25/17 1138  Weight: 156 lb 9.6 oz (71 kg) 156 lb 9.6 oz (71 kg)    GENERAL: Well-nourished well-developed; Alert, no distress and comfortable.  Accompanied by family.  EYES: no pallor or icterus OROPHARYNX: no thrush or ulceration; NECK: supple; no lymph nodes felt. LYMPH:  no palpable  lymphadenopathy in the axillary or inguinal regions LUNGS: Decreased breath sounds auscultation bilaterally. No wheeze or crackles HEART/CVS: regular rate & rhythm and no murmurs; No lower extremity edema ABDOMEN:abdomen soft, non-tender and normal bowel sounds. No hepatomegaly or splenomegaly.  Musculoskeletal:no cyanosis of digits and no clubbing  PSYCH: alert & oriented x 3 with fluent speech NEURO: no focal motor/sensory deficits SKIN:  no rashes or significant lesions    LABORATORY DATA:  I have reviewed the data as listed    Component Value Date/Time   NA 137 09/25/2017 1100   NA 139 06/29/2010 0749   K 4.4 09/25/2017 1100   CL 104 09/25/2017 1100   CO2 23 09/25/2017 1100   GLUCOSE 110 (H) 09/25/2017 1100   BUN 24 (H) 09/25/2017 1100   BUN 16 06/29/2010 0749   CREATININE 1.27 (H) 09/25/2017 1100   CALCIUM 9.8 09/25/2017 1100   PROT 6.4 (L) 09/25/2017 1100   ALBUMIN 4.6 09/25/2017 1100   AST 22 09/25/2017 1100   ALT 15 09/25/2017 1100   ALKPHOS 46 09/25/2017 1100   BILITOT 1.2 09/25/2017 1100   GFRNONAA 44 (L) 09/25/2017 1100   GFRAA 51 (L) 09/25/2017 1100    No results found for: SPEP, UPEP  Lab Results  Component Value Date   WBC 3.8 09/25/2017   NEUTROABS 2.7 09/25/2017   HGB 10.9 (L) 09/25/2017   HCT 30.3 (L) 09/25/2017   MCV 93.9 09/25/2017   PLT 118 (L) 09/25/2017      Chemistry      Component Value Date/Time   NA 137 09/25/2017 1100   NA 139 06/29/2010 0749   K 4.4 09/25/2017 1100   CL 104 09/25/2017 1100   CO2 23 09/25/2017 1100   BUN 24 (H) 09/25/2017 1100   BUN 16 06/29/2010 0749   CREATININE 1.27 (H) 09/25/2017 1100   GLU 93 06/29/2010 0749      Component Value Date/Time   CALCIUM 9.8 09/25/2017 1100   ALKPHOS 46 09/25/2017 1100   AST 22 09/25/2017 1100   ALT 15 09/25/2017 1100   BILITOT 1.2 09/25/2017 1100       RADIOGRAPHIC STUDIES: I have personally reviewed the radiological images as listed and agreed with the findings in the  report. No results found.   ASSESSMENT & PLAN:  Diffuse large B-cell lymphoma of lymph nodes of multiple regions [facility]) # RELAPSED Diffuse large B-cell lymphoma/leukemia-status post autologous stem cell transplant July 11, 2017.   #Sep 11, 2017-PET scan [[facility]]-CR.  Continue surveillance.  #Double vision-unclear etiology; new; question malignancy related versus benign causes.  Otherwise neurologic exam is completely benign.  Will reach out to [facility] eye/optometry regarding their opinion.  Question need for MRI of the brain; will also reach out to Dr. Evette Doffing  #Creatinine 1.27/slightly high from baseline;-recommend increased fluid intake.  # follow up with me in 3 weeks/labs/ cbc/cmp/mag; family will let me know re: frequency of lab visits.    Orders Placed This Encounter  Procedures  . CBC with Differential/Platelet    Standing Status:   Future    Standing Expiration Date:   09/26/2018  . Comprehensive metabolic panel    Standing Status:   Future    Standing Expiration Date:   09/26/2018  . Magnesium    Standing Status:   Future    Standing Expiration Date:   09/25/2018   All questions were answered. The patient knows to call the clinic with any problems, questions or concerns.      Cammie Sickle, MD 09/25/2017 1:23 PM

## 2017-09-25 NOTE — Assessment & Plan Note (Addendum)
#   RELAPSED Diffuse large B-cell lymphoma/leukemia-status post autologous stem cell transplant July 11, 2017.   #Sep 11, 2017-PET scan [[facility]]-CR.  Continue surveillance.  #Double vision-unclear etiology; new; question malignancy related versus benign causes.  Otherwise neurologic exam is completely benign.  Will reach out to [facility] eye/optometry regarding their opinion.  Question need for MRI of the brain; will also reach out to Dr. Evette Doffing  #Creatinine 1.27/slightly high from baseline;-recommend increased fluid intake.  # follow up with me in 3 weeks/labs/ cbc/cmp/mag; family will let me know re: frequency of lab visits.

## 2017-09-27 ENCOUNTER — Encounter: Payer: Self-pay | Admitting: Internal Medicine

## 2017-10-01 ENCOUNTER — Telehealth: Payer: Self-pay | Admitting: Internal Medicine

## 2017-10-01 ENCOUNTER — Telehealth: Payer: Self-pay | Admitting: *Deleted

## 2017-10-01 NOTE — Telephone Encounter (Signed)
Left a message for patient to call us back to discuss regarding her double vision.   I spoke to pt's optometrist.  My recommendation would be-if double vision not improved or getting worse; recommend MRI of the brain stat.  If double vision is stable/improving-then recommend holding off MRI of the brain.

## 2017-10-01 NOTE — Telephone Encounter (Signed)
Spoke to patient regarding my recommendations/will call us if her double vision gets worse; hold off mri brain at this time.

## 2017-10-01 NOTE — Telephone Encounter (Signed)
Dr. Rogue Bussing - patient is returning your phone call per Dan Europe

## 2017-10-16 ENCOUNTER — Inpatient Hospital Stay: Payer: BLUE CROSS/BLUE SHIELD | Attending: Internal Medicine

## 2017-10-16 ENCOUNTER — Encounter: Payer: Self-pay | Admitting: Internal Medicine

## 2017-10-16 ENCOUNTER — Inpatient Hospital Stay (HOSPITAL_BASED_OUTPATIENT_CLINIC_OR_DEPARTMENT_OTHER): Payer: BLUE CROSS/BLUE SHIELD | Admitting: Internal Medicine

## 2017-10-16 VITALS — BP 111/72 | HR 79 | Temp 97.3°F | Resp 18 | Ht 62.0 in | Wt 154.0 lb

## 2017-10-16 DIAGNOSIS — H532 Diplopia: Secondary | ICD-10-CM | POA: Diagnosis not present

## 2017-10-16 DIAGNOSIS — Z9221 Personal history of antineoplastic chemotherapy: Secondary | ICD-10-CM | POA: Insufficient documentation

## 2017-10-16 DIAGNOSIS — C8338 Diffuse large B-cell lymphoma, lymph nodes of multiple sites: Secondary | ICD-10-CM | POA: Insufficient documentation

## 2017-10-16 DIAGNOSIS — Z9484 Stem cells transplant status: Secondary | ICD-10-CM | POA: Diagnosis not present

## 2017-10-16 LAB — CBC WITH DIFFERENTIAL/PLATELET
Basophils Absolute: 0 10*3/uL (ref 0–0.1)
Basophils Relative: 1 %
EOS ABS: 0 10*3/uL (ref 0–0.7)
EOS PCT: 1 %
HCT: 33.5 % — ABNORMAL LOW (ref 35.0–47.0)
Hemoglobin: 11.7 g/dL — ABNORMAL LOW (ref 12.0–16.0)
LYMPHS ABS: 0.6 10*3/uL — AB (ref 1.0–3.6)
Lymphocytes Relative: 11 %
MCH: 33.5 pg (ref 26.0–34.0)
MCHC: 34.9 g/dL (ref 32.0–36.0)
MCV: 96.1 fL (ref 80.0–100.0)
MONO ABS: 0.4 10*3/uL (ref 0.2–0.9)
MONOS PCT: 8 %
Neutro Abs: 4 10*3/uL (ref 1.4–6.5)
Neutrophils Relative %: 79 %
PLATELETS: 114 10*3/uL — AB (ref 150–440)
RBC: 3.49 MIL/uL — ABNORMAL LOW (ref 3.80–5.20)
RDW: 13.6 % (ref 11.5–14.5)
WBC: 5.1 10*3/uL (ref 3.6–11.0)

## 2017-10-16 LAB — COMPREHENSIVE METABOLIC PANEL
ALK PHOS: 52 U/L (ref 38–126)
ALT: 15 U/L (ref 14–54)
ANION GAP: 9 (ref 5–15)
AST: 20 U/L (ref 15–41)
Albumin: 4.5 g/dL (ref 3.5–5.0)
BUN: 18 mg/dL (ref 6–20)
CALCIUM: 9.8 mg/dL (ref 8.9–10.3)
CO2: 25 mmol/L (ref 22–32)
Chloride: 107 mmol/L (ref 101–111)
Creatinine, Ser: 1.08 mg/dL — ABNORMAL HIGH (ref 0.44–1.00)
GFR calc non Af Amer: 53 mL/min — ABNORMAL LOW (ref >60)
Glucose, Bld: 111 mg/dL — ABNORMAL HIGH (ref 65–99)
POTASSIUM: 4.7 mmol/L (ref 3.5–5.1)
SODIUM: 141 mmol/L (ref 135–145)
Total Bilirubin: 0.6 mg/dL (ref 0.3–1.2)
Total Protein: 6.2 g/dL — ABNORMAL LOW (ref 6.5–8.1)

## 2017-10-16 LAB — MAGNESIUM: Magnesium: 1.7 mg/dL (ref 1.7–2.4)

## 2017-10-16 MED ORDER — LORAZEPAM 0.5 MG PO TABS: 0.5000 mg | ORAL_TABLET | Freq: Three times a day (TID) | ORAL | 0 refills | Status: DC

## 2017-10-16 NOTE — Progress Notes (Signed)
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 CT- 1-2 CM LN subpectoral/medistinal/ retro-peritoneal/pelvic/ Right inguinal LN 1.6cm- Bx- DLBCL- ABC; myc-POS; FISH gene re-arragement-NEG. PET- MULTIPLE LN/ Bone involvement; R-CHOP x6- CR'# OCT 2017- PET scan- CR; DEC 22nd 2017- BMBx- "atypical large B cells- <1%  [[facility]; II opinion- Dr.Grover- review of path- not concerning for residual lymphoma]; recommend surviellance  # March 26th  2018PET- NED  # Lumbar puncture- difficult-spinal headache. S/p high dose MXT x4.  ------------------------------------------------------------------- # NOV 2018-recurrent diffuse large B-cell lymphoma [right axilla lymph node biopsy proven]; PET- multiple bone lesions; axillary adenopathy; splenomegaly uptake  # Nov 16th 2018- R-; s/p 3 cycles-CR' Auto-Stem cell Transplant date:07/11/17 [facility]; Dr.Vincent]; May 1st 2019- PET CR.  ---------------------------------------------------------- # June 2017-Elevated LFTs- valacylovir/diflucan; resolved.   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  female pleasant patient above history of diffuse large B-cell lymphoma status post autologous stem cell transplant on February 2018 here for follow-up.  Patient continues to have intermittent double vision.  However this improved after correction of her lenses.  Denies any headaches.  Denies any falls.  Review of Systems  Constitutional: Negative for chills, diaphoresis, fever, malaise/fatigue and weight loss.  HENT: Negative for nosebleeds and sore throat.   Eyes: Positive for double vision (Improving).  Respiratory: Negative for cough, hemoptysis, sputum production, shortness of breath and wheezing.   Cardiovascular: Negative for chest pain, palpitations, orthopnea and leg swelling.  Gastrointestinal: Negative for abdominal pain, blood in stool, constipation, diarrhea, heartburn, melena, nausea and vomiting.  Genitourinary: Negative for dysuria, frequency and urgency.  Musculoskeletal: Negative for back pain and joint pain.  Skin: Negative.  Negative for itching and rash.  Neurological: Negative for dizziness, tingling, focal weakness, weakness and headaches.  Endo/Heme/Allergies: Does not bruise/bleed easily.  Psychiatric/Behavioral: Negative for depression. The patient is not nervous/anxious and does not have insomnia.       PAST MEDICAL HISTORY :  Past Medical History:  Diagnosis Date  . Arthritis   . Blood dyscrasia   . Cancer ([place])   . Chest pain, unspecified   . Diverticulosis 07/06/15  . Dysrhythmia    tachycardia  . Esophagitis 07/06/15  . Fatigue   . Gastritis 07/06/15  . GERD (gastroesophageal reflux disease)   . Hypopharyngeal lesion  07/06/15  . Leg cramps   . Lymphoma ([place]) 09/14/15  Monoclonal B cell lymphoma  . Night sweats   . Osteoporosis   . Overweight(278.02)    Obesity  . Palpitations   . Shortness of breath dyspnea   . Tachycardia   . Thrombocytopenia ([place])     PAST SURGICAL HISTORY :   Past Surgical History:  Procedure Laterality Date  . ABDOMINAL HYSTERECTOMY  2005  . BONE MARROW BIOPSY  09/14/15  . CHOLECYSTECTOMY  1997  . COLONOSCOPY  07/05/2014  . ESOPHAGOGASTRODUODENOSCOPY  07/05/2014  . INGUINAL LYMPH NODE BIOPSY Right 10/05/2015   Procedure: INGUINAL LYMPH NODE BIOPSY;  Surgeon: Christene Lye, MD;  Location: [facility] ORS;  Service: General;  Laterality: Right;  . PERIPHERAL VASCULAR CATHETERIZATION N/A 09/27/2015   Procedure: Glori Luis Cath Insertion;  Surgeon: Algernon Huxley, MD;  Location: [place] CV LAB;  Service: Cardiovascular;  Laterality: N/A;  . PORTA CATH REMOVAL Right 06/03/2017   Procedure: PORTA CATH REMOVAL, with venous thrombectomy;  Surgeon: Algernon Huxley, MD;  Location: [place] CV LAB;  Service: Cardiovascular;  Laterality: Right;  . PORTACATH PLACEMENT Right     FAMILY HISTORY :   Family History  Problem Relation Age of Onset  . Heart disease Father        CABG x 4  . Multiple myeloma Father   . Hypertension Mother   . Pancreatic cancer Mother   . Ulcers Mother   . Asthma Mother   . Brain cancer Maternal Uncle   . Multiple myeloma Maternal Uncle   . Diabetes Neg Hx   . Breast cancer Neg Hx     SOCIAL HISTORY:   Social History   Tobacco Use  . Smoking status: Never Smoker  . Smokeless tobacco: Never Used  Substance Use Topics  . Alcohol use: No    Alcohol/week: 0.0 oz  . Drug use: No    ALLERGIES:  is allergic to ondansetron hcl; oxycodone; and rituximab.  MEDICATIONS:  Current Outpatient Medications  Medication Sig Dispense Refill  . acetaminophen (TYLENOL 8 HOUR) 650 MG CR tablet Take 650 mg by mouth every 8 (eight) hours as needed for pain.    Marland Kitchen  acyclovir (ZOVIRAX) 400 MG tablet TAKE 1 TABLET BY MOUTH TWICE DAILY. TAKE1 TABLET A DAY (TO PREVENT SHINGLES) 60 tablet 3  . Alum Hydroxide-Mag Carbonate (GAVISCON PO) Take by mouth.    . ranitidine (ZANTAC) 150 MG tablet Take 150 mg by mouth 2 (two) times daily.    Marland Kitchen XIIDRA 5 % SOLN 1 drop.   4  . CALCIUM PO Take 600 mg 2 (two) times daily by mouth.     Javier Docker Oil 1000 MG CAPS Take 1 capsule by mouth daily.     Marland Kitchen LORazepam (ATIVAN) 0.5 MG tablet Take 1 tablet (0.5 mg total) by mouth every 8 (eight) hours. 30 tablet 0  . ondansetron (ZOFRAN) 8 MG tablet Take 1 tablet (8 mg total) 2 (two) times daily as needed by mouth. Start on the 3rd day after cisplatin chemotherapy. (Patient not taking: Reported on 10/16/2017) 30 tablet 1   No current facility-administered medications for this visit.    Facility-Administered Medications Ordered in Other Visits  Medication Dose Route Frequency Provider Last Rate Last Dose  . 0.9 %  sodium chloride infusion   Intravenous Continuous Evlyn Kanner, NP   Stopped at 10/14/15 1312  . methotrexate (50 mg/ml) 6.3 g in sodium chloride 0.9 % 1,000 mL injection   Intravenous Once Cammie Sickle, MD      .  sodium chloride flush (NS) 0.9 % injection 10 mL  10 mL Intravenous PRN Cammie Sickle, MD   10 mL at 10/05/16 1400    PHYSICAL EXAMINATION: ECOG PERFORMANCE STATUS: 0 - Asymptomatic  BP 111/72   Pulse 79   Temp (!) 97.3 F (36.3 C) (Tympanic)   Resp 18   Ht 5' 2"  (1.575 m)   Wt 154 lb (69.9 kg)   BMI 28.17 kg/m   Filed Weights   10/16/17 1029  Weight: 154 lb (69.9 kg)    GENERAL: Well-nourished well-developed; Alert, no distress and comfortable..  Accompanied by husband.   EYES: no pallor or icterus OROPHARYNX: no thrush or ulceration; NECK: supple; no lymph nodes felt. LYMPH:  no palpable lymphadenopathy in the axillary or inguinal regions LUNGS: Decreased breath sounds auscultation bilaterally. No wheeze or crackles HEART/CVS:  regular rate & rhythm and no murmurs; No lower extremity edema ABDOMEN:abdomen soft, non-tender and normal bowel sounds. No hepatomegaly or splenomegaly.  Musculoskeletal:no cyanosis of digits and no clubbing  PSYCH: alert & oriented x 3 with fluent speech NEURO: no focal motor/sensory deficits SKIN:  no rashes or significant lesions    LABORATORY DATA:  I have reviewed the data as listed    Component Value Date/Time   NA 141 10/16/2017 1010   NA 139 06/29/2010 0749   K 4.7 10/16/2017 1010   CL 107 10/16/2017 1010   CO2 25 10/16/2017 1010   GLUCOSE 111 (H) 10/16/2017 1010   BUN 18 10/16/2017 1010   BUN 16 06/29/2010 0749   CREATININE 1.08 (H) 10/16/2017 1010   CALCIUM 9.8 10/16/2017 1010   PROT 6.2 (L) 10/16/2017 1010   ALBUMIN 4.5 10/16/2017 1010   AST 20 10/16/2017 1010   ALT 15 10/16/2017 1010   ALKPHOS 52 10/16/2017 1010   BILITOT 0.6 10/16/2017 1010   GFRNONAA 53 (L) 10/16/2017 1010   GFRAA >60 10/16/2017 1010    No results found for: SPEP, UPEP  Lab Results  Component Value Date   WBC 5.1 10/16/2017   NEUTROABS 4.0 10/16/2017   HGB 11.7 (L) 10/16/2017   HCT 33.5 (L) 10/16/2017   MCV 96.1 10/16/2017   PLT 114 (L) 10/16/2017      Chemistry      Component Value Date/Time   NA 141 10/16/2017 1010   NA 139 06/29/2010 0749   K 4.7 10/16/2017 1010   CL 107 10/16/2017 1010   CO2 25 10/16/2017 1010   BUN 18 10/16/2017 1010   BUN 16 06/29/2010 0749   CREATININE 1.08 (H) 10/16/2017 1010   GLU 93 06/29/2010 0749      Component Value Date/Time   CALCIUM 9.8 10/16/2017 1010   ALKPHOS 52 10/16/2017 1010   AST 20 10/16/2017 1010   ALT 15 10/16/2017 1010   BILITOT 0.6 10/16/2017 1010       RADIOGRAPHIC STUDIES: I have personally reviewed the radiological images as listed and agreed with the findings in the report. No results found.   ASSESSMENT & PLAN:  Diffuse large B-cell lymphoma of lymph nodes of multiple regions ([place]) #Relapsed diffuse large B-cell  lymphoma status post autologous stem cell transplant; May 2019 PET scan CR; stable continue surveillance.  #Double vision-likely secondary to refractory errors; discussed with patient's optometrist.  Currently improving.  If getting worse recommend inform us will get MRI of the brain ASAP as patient high risk of leptomeningeal involvement.  #Creatinine 1.07-improved.  Continue increased fluid intake.  #Follow-up in 1 month/labs.   Orders  Placed This Encounter  Procedures  . CBC with Differential    Standing Status:   Future    Standing Expiration Date:   10/17/2018  . Comprehensive metabolic panel    Standing Status:   Future    Standing Expiration Date:   10/17/2018  . Lactate dehydrogenase    Standing Status:   Future    Standing Expiration Date:   10/17/2018   All questions were answered. The patient knows to call the clinic with any problems, questions or concerns.      Cammie Sickle, MD 10/16/2017 8:27 PM

## 2017-10-16 NOTE — Assessment & Plan Note (Addendum)
#  Relapsed diffuse large B-cell lymphoma status post autologous stem cell transplant; May 2019 PET scan CR; stable continue surveillance.  #Double vision-likely secondary to refractory errors; discussed with patient's optometrist.  Currently improving.  If getting worse recommend inform us will get MRI of the brain ASAP as patient high risk of leptomeningeal involvement.  #Creatinine 1.07-improved.  Continue increased fluid intake.  #Follow-up in 1 month/labs.

## 2017-10-29 ENCOUNTER — Telehealth: Payer: Self-pay

## 2017-10-29 DIAGNOSIS — Z1239 Encounter for other screening for malignant neoplasm of breast: Secondary | ICD-10-CM

## 2017-10-29 NOTE — Telephone Encounter (Signed)
Copied from [place] [id number]. Topic: Referral - Request >> Oct 29, 2017  2:31 PM Yvette Rack wrote: Reason for CRM: pt calling for a referral for a mammogram at the hospital

## 2017-10-30 NOTE — Addendum Note (Signed)
Addended by: Adair Laundry on: 10/30/2017 01:13 PM   Modules accepted: Orders

## 2017-10-30 NOTE — Telephone Encounter (Signed)
Mammogram ordered and pt is aware.

## 2017-11-13 ENCOUNTER — Encounter: Payer: Self-pay | Admitting: Internal Medicine

## 2017-11-13 ENCOUNTER — Inpatient Hospital Stay: Payer: BLUE CROSS/BLUE SHIELD | Attending: Internal Medicine

## 2017-11-13 ENCOUNTER — Inpatient Hospital Stay (HOSPITAL_BASED_OUTPATIENT_CLINIC_OR_DEPARTMENT_OTHER): Payer: BLUE CROSS/BLUE SHIELD | Admitting: Internal Medicine

## 2017-11-13 ENCOUNTER — Other Ambulatory Visit: Payer: Self-pay

## 2017-11-13 VITALS — BP 119/78 | HR 69 | Temp 97.9°F | Resp 18 | Ht 62.0 in | Wt 152.2 lb

## 2017-11-13 DIAGNOSIS — D696 Thrombocytopenia, unspecified: Secondary | ICD-10-CM | POA: Diagnosis not present

## 2017-11-13 DIAGNOSIS — Z79899 Other long term (current) drug therapy: Secondary | ICD-10-CM | POA: Diagnosis not present

## 2017-11-13 DIAGNOSIS — C8338 Diffuse large B-cell lymphoma, lymph nodes of multiple sites: Secondary | ICD-10-CM | POA: Diagnosis present

## 2017-11-13 DIAGNOSIS — Z9221 Personal history of antineoplastic chemotherapy: Secondary | ICD-10-CM | POA: Diagnosis not present

## 2017-11-13 DIAGNOSIS — H532 Diplopia: Secondary | ICD-10-CM | POA: Insufficient documentation

## 2017-11-13 LAB — COMPREHENSIVE METABOLIC PANEL
ALT: 12 U/L (ref 0–44)
ANION GAP: 11 (ref 5–15)
AST: 18 U/L (ref 15–41)
Albumin: 4.4 g/dL (ref 3.5–5.0)
Alkaline Phosphatase: 58 U/L (ref 38–126)
BUN: 14 mg/dL (ref 8–23)
CHLORIDE: 103 mmol/L (ref 98–111)
CO2: 24 mmol/L (ref 22–32)
CREATININE: 1.13 mg/dL — AB (ref 0.44–1.00)
Calcium: 9.6 mg/dL (ref 8.9–10.3)
GFR calc non Af Amer: 51 mL/min — ABNORMAL LOW (ref >60)
GFR, EST AFRICAN AMERICAN: 59 mL/min — AB (ref >60)
Glucose, Bld: 116 mg/dL — ABNORMAL HIGH (ref 70–99)
POTASSIUM: 4.7 mmol/L (ref 3.5–5.1)
SODIUM: 138 mmol/L (ref 135–145)
Total Bilirubin: 0.9 mg/dL (ref 0.3–1.2)
Total Protein: 6.2 g/dL — ABNORMAL LOW (ref 6.5–8.1)

## 2017-11-13 LAB — CBC WITH DIFFERENTIAL/PLATELET
Basophils Absolute: 0 10*3/uL (ref 0–0.1)
Basophils Relative: 1 %
Eosinophils Absolute: 0.1 10*3/uL (ref 0–0.7)
Eosinophils Relative: 1 %
HCT: 34.2 % — ABNORMAL LOW (ref 35.0–47.0)
HEMOGLOBIN: 12.3 g/dL (ref 12.0–16.0)
LYMPHS ABS: 0.7 10*3/uL — AB (ref 1.0–3.6)
Lymphocytes Relative: 15 %
MCH: 33.6 pg (ref 26.0–34.0)
MCHC: 36.1 g/dL — AB (ref 32.0–36.0)
MCV: 93.3 fL (ref 80.0–100.0)
MONOS PCT: 8 %
Monocytes Absolute: 0.4 10*3/uL (ref 0.2–0.9)
NEUTROS ABS: 3.5 10*3/uL (ref 1.4–6.5)
NEUTROS PCT: 75 %
Platelets: 117 10*3/uL — ABNORMAL LOW (ref 150–440)
RBC: 3.66 MIL/uL — AB (ref 3.80–5.20)
RDW: 12.7 % (ref 11.5–14.5)
WBC: 4.7 10*3/uL (ref 3.6–11.0)

## 2017-11-13 LAB — LACTATE DEHYDROGENASE: LDH: 115 U/L (ref 98–192)

## 2017-11-13 NOTE — Assessment & Plan Note (Addendum)
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  Stable.  Continue surveillance.   #Double vision -likely secondary to refractory error;  overall improving.  No suspicion for leptomeningeal disease.   #Thrombocytopenia platelets 130s; stable status post stem cell transplant monitor for now.  # palpitations-previous work-up negative for arrhythmia.  Likely anxiety.  Improved with ativan.  #Fatigue-mild to moderate secondary to transplant.  Worse.  Recommend [facility] care program; exercise.  Patient states that she has been exercising quite vigorously.  Monitor closely.  # follow up scan in sep/ [facility] or here. Follow up in [facility] in sep 2019/immuni.  Follow-up in 1 month labs.

## 2017-11-13 NOTE — Progress Notes (Signed)
[place] OFFICE PROGRESS NOTE  Patient Care Team: Crecencio Mc, MD as PCP - General (Internal Medicine) Cammie Sickle, MD as Consulting Physician (Internal Medicine) Christene Lye, MD (General Surgery) [name], MD as Consulting Physician (Cardiology)  Cancer Staging Large cell lymphoma of intra-abdominal lymph nodes [facility]) Staging form: Lymphoid Neoplasms, AJCC 6th Edition - Clinical stage from 09/13/2015: Stage IV - Signed by Cammie Sickle, MD on 10/06/2015    Oncology History   #MAY 2017- LARGE B CELL LYMPHOMA with intravascular features STAGE IV- [BMBx- hypercellular- lymphoproliferative process is mostly seen within small vessels in the bone marrow as well as the surrounding interstitium associated with circulating lymphoma cells in the peripheral blood; ]. CT- 1-2 CM LN subpectoral/medistinal/ retro-peritoneal/pelvic/ Right inguinal LN 1.6cm- Bx- DLBCL- ABC; myc-POS; FISH gene re-arragement-NEG. PET- MULTIPLE LN/ Bone involvement; R-CHOP x6- CR'# OCT 2017- PET scan- CR; DEC 22nd 2017- BMBx- "atypical large B cells- <1%  [[facility]; II opinion- Dr.Grover- review of path- not concerning for residual lymphoma]; recommend surviellance  # March 26th  2018PET- NED  # Lumbar puncture- difficult-spinal headache. S/p high dose MXT x4.  ------------------------------------------------------------------- # NOV 2018-recurrent diffuse large B-cell lymphoma [right axilla lymph node biopsy proven]; PET- multiple bone lesions; axillary adenopathy; splenomegaly uptake  # Nov 16th 2018- R-; s/p 3 cycles-CR' Auto-Stem cell Transplant date:07/11/17 [facility]; Dr.Vincent]; May 1st 2019- PET CR.  ---------------------------------------------------------- # June 2017-Elevated LFTs- valacylovir/diflucan; resolved.   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   # May 2017- EF- 55-65%  --------------------------------------------------   DIAGNOSIS: Diffuse large  B-cell lymphoma  STAGE: 4       ;GOALS: Cure  CURRENT/MOST RECENT THERAPY-autologous stem cell transplant [facility] 28th 2019]-surveillance      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)    Diffuse large B-cell lymphoma of lymph nodes of multiple regions [facility])      INTERVAL HISTORY:  Stacy Murillo 62 y.o.  female pleasant patient above history of large B-cell lymphoma status post autologous stem cell transplant end of February 2019 is here for follow-up.  Patient continues to have intermittent double vision; which is stable/better denies any worsening headaches or neck stiffness.   Patient has intermittent palpitations; which is improved with taking Ativan.   Review of Systems  Constitutional: Negative for chills, diaphoresis, fever, malaise/fatigue and weight loss.  HENT: Negative for nosebleeds and sore throat.   Eyes: Negative for double vision.  Respiratory: Negative for cough, hemoptysis, sputum production, shortness of breath and wheezing.   Cardiovascular: Negative for chest pain, palpitations, orthopnea and leg swelling.  Gastrointestinal: Negative for abdominal pain, blood in stool, constipation, diarrhea, heartburn, melena, nausea and vomiting.  Genitourinary: Negative for dysuria, frequency and urgency.  Musculoskeletal: Negative for back pain and joint pain.  Skin: Negative.  Negative for itching and rash.  Neurological: Negative for dizziness, tingling, focal weakness, weakness and headaches.  Endo/Heme/Allergies: Does not bruise/bleed easily.  Psychiatric/Behavioral: Negative for depression. The patient is not nervous/anxious and does not have insomnia.       PAST MEDICAL HISTORY :  Past Medical History:  Diagnosis Date  . Arthritis   . Blood dyscrasia   . Cancer ([place])   . Chest pain, unspecified   . Diverticulosis 07/06/15  . Dysrhythmia    tachycardia  . Esophagitis 07/06/15  . Fatigue   . Gastritis 07/06/15  . GERD (gastroesophageal  reflux disease)   . 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 Leg cramps   .  Lymphoma ([place]) 09/14/15   Monoclonal B cell lymphoma  . Night sweats   . Osteoporosis   . Overweight(278.02)    Obesity  . Palpitations   . Shortness of breath dyspnea   . Tachycardia   . Thrombocytopenia ([place])     PAST SURGICAL HISTORY :   Past Surgical History:  Procedure Laterality Date  . ABDOMINAL HYSTERECTOMY  2005  . BONE MARROW BIOPSY  09/14/15  . CHOLECYSTECTOMY  1997  . COLONOSCOPY  07/05/2014  . ESOPHAGOGASTRODUODENOSCOPY  07/05/2014  . INGUINAL LYMPH NODE BIOPSY Right 10/05/2015   Procedure: INGUINAL LYMPH NODE BIOPSY;  Surgeon: Christene Lye, MD;  Location: [facility] ORS;  Service: General;  Laterality: Right;  . PERIPHERAL VASCULAR CATHETERIZATION N/A 09/27/2015   Procedure: Glori Luis Cath Insertion;  Surgeon: Algernon Huxley, MD;  Location: [place] CV LAB;  Service: Cardiovascular;  Laterality: N/A;  . PORTA CATH REMOVAL Right 06/03/2017   Procedure: PORTA CATH REMOVAL, with venous thrombectomy;  Surgeon: Algernon Huxley, MD;  Location: [place] CV LAB;  Service: Cardiovascular;  Laterality: Right;  . PORTACATH PLACEMENT Right     FAMILY HISTORY :   Family History  Problem Relation Age of Onset  . Heart disease Father        CABG x 4  . Multiple myeloma Father   . Hypertension Mother   . Pancreatic cancer Mother   . Ulcers Mother   . Asthma Mother   . Brain cancer Maternal Uncle   . Multiple myeloma Maternal Uncle   . Diabetes Neg Hx   . Breast cancer Neg Hx     SOCIAL HISTORY:   Social History   Tobacco Use  . Smoking status: Never Smoker  . Smokeless tobacco: Never Used  Substance Use Topics  . Alcohol use: No    Alcohol/week: 0.0 oz  . Drug use: No    ALLERGIES:  is allergic to ondansetron hcl; oxycodone; and rituximab.  MEDICATIONS:  Current Outpatient Medications  Medication Sig Dispense Refill  . acyclovir (ZOVIRAX) 400 MG tablet TAKE 1 TABLET BY MOUTH TWICE DAILY. TAKE1  TABLET A DAY (TO PREVENT SHINGLES) 60 tablet 3  . CALCIUM PO Take 600 mg 2 (two) times daily by mouth.     Marland Kitchen LORazepam (ATIVAN) 0.5 MG tablet Take 1 tablet (0.5 mg total) by mouth every 8 (eight) hours. 30 tablet 0  . ranitidine (ZANTAC) 150 MG tablet Take 150 mg by mouth 2 (two) times daily.    Marland Kitchen XIIDRA 5 % SOLN 1 drop.   4  . acetaminophen (TYLENOL 8 HOUR) 650 MG CR tablet Take 650 mg by mouth every 8 (eight) hours as needed for pain.    Marland Kitchen Alum Hydroxide-Mag Carbonate (GAVISCON PO) Take 1 tablet by mouth as needed (heart burn).     Javier Docker Oil 1000 MG CAPS Take 1 capsule by mouth daily.     . ondansetron (ZOFRAN) 8 MG tablet Take 1 tablet (8 mg total) 2 (two) times daily as needed by mouth. Start on the 3rd day after cisplatin chemotherapy. (Patient not taking: Reported on 11/13/2017) 30 tablet 1   No current facility-administered medications for this visit.    Facility-Administered Medications Ordered in Other Visits  Medication Dose Route Frequency Provider Last Rate Last Dose  . 0.9 %  sodium chloride infusion   Intravenous Continuous Evlyn Kanner, NP   Stopped at 10/14/15 1312  . methotrexate (50 mg/ml) 6.3 g in sodium chloride 0.9 % 1,000 mL injection  Intravenous Once Charlaine Dalton R, MD      . sodium chloride flush (NS) 0.9 % injection 10 mL  10 mL Intravenous PRN Cammie Sickle, MD   10 mL at 10/05/16 1400    PHYSICAL EXAMINATION: ECOG PERFORMANCE STATUS: 0 - Asymptomatic  BP 119/78   Pulse 69   Temp 97.9 F (36.6 C) (Tympanic)   Resp 18   Ht 5' 2"  (1.575 m)   Wt 152 lb 3.2 oz (69 kg)   BMI 27.84 kg/m   Filed Weights   11/13/17 1007  Weight: 152 lb 3.2 oz (69 kg)    GENERAL: Well-nourished well-developed; Alert, no distress and comfortable.  Accompanied by family.  EYES: no pallor or icterus OROPHARYNX: no thrush or ulceration; NECK: supple; no lymph nodes felt. LYMPH:  no palpable lymphadenopathy in the axillary or inguinal regions LUNGS:  Decreased breath sounds auscultation bilaterally. No wheeze or crackles HEART/CVS: regular rate & rhythm and no murmurs; No lower extremity edema ABDOMEN:abdomen soft, non-tender and normal bowel sounds. No hepatomegaly or splenomegaly.  Musculoskeletal:no cyanosis of digits and no clubbing  PSYCH: alert & oriented x 3 with fluent speech NEURO: no focal motor/sensory deficits SKIN:  no rashes or significant lesions    LABORATORY DATA:  I have reviewed the data as listed    Component Value Date/Time   NA 138 11/13/2017 0943   NA 139 06/29/2010 0749   K 4.7 11/13/2017 0943   CL 103 11/13/2017 0943   CO2 24 11/13/2017 0943   GLUCOSE 116 (H) 11/13/2017 0943   BUN 14 11/13/2017 0943   BUN 16 06/29/2010 0749   CREATININE 1.13 (H) 11/13/2017 0943   CALCIUM 9.6 11/13/2017 0943   PROT 6.2 (L) 11/13/2017 0943   ALBUMIN 4.4 11/13/2017 0943   AST 18 11/13/2017 0943   ALT 12 11/13/2017 0943   ALKPHOS 58 11/13/2017 0943   BILITOT 0.9 11/13/2017 0943   GFRNONAA 51 (L) 11/13/2017 0943   GFRAA 59 (L) 11/13/2017 0943    No results found for: SPEP, UPEP  Lab Results  Component Value Date   WBC 4.7 11/13/2017   NEUTROABS 3.5 11/13/2017   HGB 12.3 11/13/2017   HCT 34.2 (L) 11/13/2017   MCV 93.3 11/13/2017   PLT 117 (L) 11/13/2017      Chemistry      Component Value Date/Time   NA 138 11/13/2017 0943   NA 139 06/29/2010 0749   K 4.7 11/13/2017 0943   CL 103 11/13/2017 0943   CO2 24 11/13/2017 0943   BUN 14 11/13/2017 0943   BUN 16 06/29/2010 0749   CREATININE 1.13 (H) 11/13/2017 0943   GLU 93 06/29/2010 0749      Component Value Date/Time   CALCIUM 9.6 11/13/2017 0943   ALKPHOS 58 11/13/2017 0943   AST 18 11/13/2017 0943   ALT 12 11/13/2017 0943   BILITOT 0.9 11/13/2017 0943       RADIOGRAPHIC STUDIES: I have personally reviewed the radiological images as listed and agreed with the findings in the report. No results found.   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  Stable.  Continue surveillance.   #Double vision -likely secondary to refractory error;  overall improving.  No suspicion for leptomeningeal disease.   #Thrombocytopenia platelets 130s; stable status post stem cell transplant monitor for  now.  # palpitations-previous work-up negative for arrhythmia.  Likely anxiety.  Improved with ativan.  #Fatigue-mild to moderate secondary to transplant.  Worse.  Recommend [facility] care program; exercise.  Patient states that she has been exercising quite vigorously.  Monitor closely.  # follow up scan in sep/ [facility] or here. Follow up in [facility] in sep 2019/immuni   Orders Placed This Encounter  Procedures  . CBC with Differential/Platelet    Standing Status:   Future    Standing Expiration Date:   11/14/2018  . Comprehensive metabolic panel    Standing Status:   Future    Standing Expiration Date:   11/14/2018  . Lactate dehydrogenase    Standing Status:   Future    Standing Expiration Date:   11/14/2018   All questions were answered. The patient knows to call the clinic with any problems, questions or concerns.      Cammie Sickle, MD 11/14/2017 1:45 PM

## 2017-11-15 ENCOUNTER — Ambulatory Visit
Admission: RE | Admit: 2017-11-15 | Discharge: 2017-11-15 | Disposition: A | Discharge status: Home / Self Care | Payer: BLUE CROSS/BLUE SHIELD | Source: Ambulatory Visit | Attending: Internal Medicine | Admitting: Internal Medicine

## 2017-11-15 DIAGNOSIS — Z1239 Encounter for other screening for malignant neoplasm of breast: Secondary | ICD-10-CM

## 2017-11-15 DIAGNOSIS — Z1231 Encounter for screening mammogram for malignant neoplasm of breast: Secondary | ICD-10-CM | POA: Diagnosis not present

## 2017-12-16 ENCOUNTER — Other Ambulatory Visit: Payer: Self-pay | Admitting: Internal Medicine

## 2017-12-17 ENCOUNTER — Ambulatory Visit (INDEPENDENT_AMBULATORY_CARE_PROVIDER_SITE_OTHER): Payer: BLUE CROSS/BLUE SHIELD | Admitting: Internal Medicine

## 2017-12-17 ENCOUNTER — Other Ambulatory Visit (HOSPITAL_COMMUNITY)
Admission: RE | Admit: 2017-12-17 | Discharge: 2017-12-17 | Disposition: A | Discharge status: Home / Self Care | Payer: BLUE CROSS/BLUE SHIELD | Source: Ambulatory Visit | Attending: Internal Medicine | Admitting: Internal Medicine

## 2017-12-17 ENCOUNTER — Encounter: Payer: Self-pay | Admitting: Internal Medicine

## 2017-12-17 VITALS — BP 112/76 | HR 74 | Temp 98.1°F | Resp 14 | Ht 62.0 in | Wt 153.2 lb

## 2017-12-17 DIAGNOSIS — Z0000 Encounter for general adult medical examination without abnormal findings: Secondary | ICD-10-CM | POA: Diagnosis not present

## 2017-12-17 DIAGNOSIS — E782 Mixed hyperlipidemia: Secondary | ICD-10-CM

## 2017-12-17 DIAGNOSIS — R7301 Impaired fasting glucose: Secondary | ICD-10-CM | POA: Diagnosis not present

## 2017-12-17 DIAGNOSIS — Z124 Encounter for screening for malignant neoplasm of cervix: Secondary | ICD-10-CM | POA: Insufficient documentation

## 2017-12-17 DIAGNOSIS — C8333 Diffuse large B-cell lymphoma, intra-abdominal lymph nodes: Secondary | ICD-10-CM

## 2017-12-17 DIAGNOSIS — N951 Menopausal and female climacteric states: Secondary | ICD-10-CM

## 2017-12-17 LAB — POCT GLYCOSYLATED HEMOGLOBIN (HGB A1C): Hemoglobin A1C: 5.1 % (ref 4.0–5.6)

## 2017-12-17 NOTE — Progress Notes (Signed)
Patient ID: Stacy Murillo, female    DOB: [birth year]  Age: 62 y.o. MRN: [id number]  The patient is here for annual PREVENTIVE  examination and management of other chronic and acute problems.    DUE FOR PAP MAMMOGRAM NORMAL.  LAST WEEK COLON 2016   The risk factors are reflected in the social history.  The roster of all physicians providing medical care to patient - is listed in the Snapshot section of the chart.  Activities of daily living:  The patient is 100% independent in all ADLs: dressing, toileting, feeding as well as independent mobility  Home safety : The patient has smoke detectors in the home. They wear seatbelts.  There are no firearms at home. There is no violence in the home.   There is no risks for hepatitis, STDs or HIV. There is no   history of blood transfusion. They have no travel history to infectious disease endemic areas of the world.  The patient has seen their dentist in the last six month. They have seen their eye doctor in the last year. They admit to slight hearing difficulty with regard to whispered voices and some television programs.  They have deferred audiologic testing in the last year.  They do not  have excessive sun exposure. Discussed the need for sun protection: hats, long sleeves and use of sunscreen if there is significant sun exposure.   Diet: the importance of a healthy diet is discussed. They do have a healthy diet.  The benefits of regular aerobic exercise were discussed. She walks 4 times per week ,  20 minutes.   Depression screen: there are no signs or vegative symptoms of depression- irritability, change in appetite, anhedonia, sadness/tearfullness.  Cognitive assessment: the patient manages all their financial and personal affairs and is actively engaged. They could relate day,date,year and events; recalled 2/3 objects at 3 minutes; performed clock-face test normally.  The following portions of the patient's history were reviewed and  updated as appropriate: allergies, current medications, past family history, past medical history,  past surgical history, past social history  and problem list.  Visual acuity was not assessed per patient preference since she has regular follow up with her ophthalmologist. Hearing and body mass index were assessed and reviewed.   During the course of the visit the patient was educated and counseled about appropriate screening and preventive services including : fall prevention , diabetes screening, nutrition counseling, colorectal cancer screening, and recommended immunizations.    CC: The primary encounter diagnosis was Cervical cancer screening. Diagnoses of Impaired fasting glucose, Routine general medical examination at a health care facility, Mixed hyperlipidemia, Vaginal dryness, menopausal, and Diffuse large B-cell lymphoma of intra-abdominal lymph nodes ([name]) were also pertinent to this visit.  1) she is s/p  AUTOLOGOUS STEM CELL TRANSPLANT in Feb 2019 for recurrence of stage IV Large B cell Lymphoma .   PET scan negative May 2019 .  Feeling a little better daily but still notes increased exercise intolerance.   2) vaginal dryness.  No prior trial of lubrication . Surgical hysterectomy remotely. History of DVT Lab Results  Component Value Date   CHOL 216 (H) 10/04/2016   HDL 68.20 10/04/2016   LDLCALC 122 (H) 10/04/2016   LDLDIRECT 139.5 07/17/2012   TRIG 127.0 10/04/2016   CHOLHDL 3 10/04/2016     Lab Results  Component Value Date   HGBA1C 5.1 12/17/2017    History Stacy Murillo has a past medical history of Arthritis, Blood dyscrasia, Cancer ([place]),  Chest pain, unspecified, Diverticulosis (07/06/15), Dysrhythmia, Esophagitis (07/06/15), Fatigue, Gastritis (07/06/15), GERD (gastroesophageal reflux disease), Hypopharyngeal lesion (07/06/15), Leg cramps, Lymphoma ([place]) (09/14/15), Night sweats, Osteoporosis, Overweight(278.02), Palpitations, Shortness of breath dyspnea, Tachycardia, and  Thrombocytopenia ([place]).   She has a past surgical history that includes Abdominal hysterectomy (2005); Bone marrow biopsy (09/14/15); Colonoscopy (07/05/2014); Esophagogastroduodenoscopy (07/05/2014); Portacath placement (Right); Cardiac catheterization (N/A, 09/27/2015); Cholecystectomy (1997); Inguinal lymph node biopsy (Right, 10/05/2015); and PORTA CATH REMOVAL (Right, 06/03/2017).   Her family history includes Asthma in her mother; Brain cancer in her maternal uncle; Heart disease in her father; Hypertension in her mother; Multiple myeloma in her father and maternal uncle; Pancreatic cancer in her mother; Ulcers in her mother.She reports that she has never smoked. She has never used smokeless tobacco. She reports that she does not drink alcohol or use drugs.  Outpatient Medications Prior to Visit  Medication Sig Dispense Refill  . acetaminophen (TYLENOL 8 HOUR) 650 MG CR tablet Take 650 mg by mouth every 8 (eight) hours as needed for pain.    Marland Kitchen acyclovir (ZOVIRAX) 400 MG tablet TAKE 1 TABLET BY MOTUH TWICE DAILY. TAKE 1 TABLET A DAY (TO PREVENT SHINGLES) 60 tablet 3  . ondansetron (ZOFRAN) 8 MG tablet Take 1 tablet (8 mg total) 2 (two) times daily as needed by mouth. Start on the 3rd day after cisplatin chemotherapy. 30 tablet 1  . ranitidine (ZANTAC) 150 MG tablet Take 150 mg by mouth 2 (two) times daily.    Marland Kitchen XIIDRA 5 % SOLN 1 drop.   4  . LORazepam (ATIVAN) 0.5 MG tablet Take 1 tablet (0.5 mg total) by mouth every 8 (eight) hours. 30 tablet 0  . Alum Hydroxide-Mag Carbonate (GAVISCON PO) Take 1 tablet by mouth as needed (heart burn).     . CALCIUM PO Take 600 mg 2 (two) times daily by mouth.     Javier Docker Oil 1000 MG CAPS Take 1 capsule by mouth daily.      Facility-Administered Medications Prior to Visit  Medication Dose Route Frequency Provider Last Rate Last Dose  . 0.9 %  sodium chloride infusion   Intravenous Continuous Evlyn Kanner, NP   Stopped at 10/14/15 1312  . methotrexate (50  mg/ml) 6.3 g in sodium chloride 0.9 % 1,000 mL injection   Intravenous Once Charlaine Dalton R, MD      . sodium chloride flush (NS) 0.9 % injection 10 mL  10 mL Intravenous PRN Cammie Sickle, MD   10 mL at 10/05/16 1400    Review of Systems   Patient denies headache, fevers, malaise, unintentional weight loss, skin rash, eye pain, sinus congestion and sinus pain, sore throat, dysphagia,  hemoptysis , cough, dyspnea, wheezing, chest pain, palpitations, orthopnea, edema, abdominal pain, nausea, melena, diarrhea, constipation, flank pain, dysuria, hematuria, urinary  Frequency, nocturia, numbness, tingling, seizures,  Focal weakness, Loss of consciousness,  Tremor, insomnia, depression, anxiety, and suicidal ideation.      Objective:  BP 112/76 (BP Location: Left Arm, Patient Position: Sitting, Cuff Size: Normal)   Pulse 74   Temp 98.1 F (36.7 C) (Oral)   Resp 14   Ht 5' 2"  (1.575 m)   Wt 153 lb 3.2 oz (69.5 kg)   SpO2 99%   BMI 28.02 kg/m   Physical Exam   General Appearance:    Alert, cooperative, no distress, appears stated age  Head:    Normocephalic, without obvious abnormality, atraumatic  Eyes:    PERRL, conjunctiva/corneas clear, EOM's  intact, fundi    benign, both eyes  Ears:    Normal TM's and external ear canals, both ears  Nose:   Nares normal, septum midline, mucosa normal, no drainage    or sinus tenderness  Throat:   Lips, mucosa, and tongue normal; teeth and gums normal  Neck:   Supple, symmetrical, trachea midline, no adenopathy;    thyroid:  no enlargement/tenderness/nodules; no carotid   bruit or JVD  Back:     Symmetric, no curvature, ROM normal, no CVA tenderness  Lungs:     Clear to auscultation bilaterally, respirations unlabored  Chest Wall:    No tenderness or deformity   Heart:    Regular rate and rhythm, S1 and S2 normal, no murmur, rub   or gallop  Breast Exam:    No tenderness, masses, or nipple abnormality  Abdomen:     Soft,  non-tender, bowel sounds active all four quadrants,    no masses, no organomegaly  Genitalia:    Pelvic: cervix not found , external genitalia normal, no adnexal masses or tenderness, no cervical motion tenderness, rectovaginal septum normal, uterus normal size, shape, and consistency and vagina normal without discharge  Extremities:   Extremities normal, atraumatic, no cyanosis or edema  Pulses:   2+ and symmetric all extremities  Skin:   Skin color, texture, turgor normal, no rashes or lesions  Lymph nodes:   Cervical, supraclavicular, and axillary nodes normal  Neurologic:   CNII-XII intact, normal strength, sensation and reflexes    throughout      Assessment & Plan:   Problem List Items Addressed This Visit    Hyperlipidemia     LDL and triglycerides are at goal without medication.   No changes today  Lab Results  Component Value Date   CHOL 216 (H) 10/04/2016   HDL 68.20 10/04/2016   LDLCALC 122 (H) 10/04/2016   LDLDIRECT 139.5 07/17/2012   TRIG 127.0 10/04/2016   CHOLHDL 3 10/04/2016         Routine general medical examination at a health care facility    age appropriate education and counseling updated, referrals for preventative services and immunizations addressed, dietary and smoking counseling addressed, most recent labs reviewed.  I have personally reviewed and have noted:  1) the patient's medical and social history 2) The pt's use of alcohol, tobacco, and illicit drugs 3) The patient's current medications and supplements 4) Functional ability including ADL's, fall risk, home safety risk, hearing and visual impairment 5) Diet and physical activities 6) Evidence for depression or mood disorder 7) The patient's height, weight, and BMI have been recorded in the chart  I have made referrals, and provided counseling and education based on review of the above      DLBCL (diffuse large B cell lymphoma) ([place])    Diagnosed in 2017 , treated with CHOP,  Recurrent  noted in Feb 2019.   Now s/o autologous stem celll transplant  with remission noted by serial PET scans .      Vaginal dryness, menopausal    She has C/I to treatment with estrogen given her history of DVT        Other Visit Diagnoses    Cervical cancer screening    -  Primary   Relevant Orders   Cytology - PAP   Impaired fasting glucose       Relevant Orders   POCT HgB A1C (Completed)      I have discontinued Ulis Rias B. Keller's Alum Hydroxide-Mag  Carbonate (GAVISCON PO), CALCIUM PO, and Krill Oil. I am also having her maintain her ranitidine, acetaminophen, XIIDRA, ondansetron, and acyclovir.  No orders of the defined types were placed in this encounter.   Medications Discontinued During This Encounter  Medication Reason  . Alum Hydroxide-Mag Carbonate (GAVISCON PO) Patient has not taken in last 30 days  . CALCIUM PO Patient has not taken in last 30 days  . Krill Oil 1000 MG CAPS Patient has not taken in last 30 days    Follow-up: No follow-ups on file.   Crecencio Mc, MD

## 2017-12-17 NOTE — Patient Instructions (Addendum)
I ATTEMPTED A PAP SMEAR TODAY,  BUT I COULD NOT FIND YOUR CERVIX!    If no cervical cells are found,  No future pap smears are needed   Given your history of blood clot,  The vaginal estrogen may not be a  Safe choice for you after all.   OTC vaginal lubricants are available to manage dryness, and noe of them have hormones in them  KY vaginal suppository is worth trying   Health Maintenance for Postmenopausal Women Menopause is a normal process in which your reproductive ability comes to an end. This process happens gradually over a span of months to years, usually between the ages of 52 and 38. Menopause is complete when you have missed 12 consecutive menstrual periods. It is important to talk with your health care provider about some of the most common conditions that affect postmenopausal women, such as heart disease, cancer, and bone loss (osteoporosis). Adopting a healthy lifestyle and getting preventive care can help to promote your health and wellness. Those actions can also lower your chances of developing some of these common conditions. What should I know about menopause? During menopause, you may experience a number of symptoms, such as:  Moderate-to-severe hot flashes.  Night sweats.  Decrease in sex drive.  Mood swings.  Headaches.  Tiredness.  Irritability.  Memory problems.  Insomnia.  Choosing to treat or not to treat menopausal changes is an individual decision that you make with your health care provider. What should I know about hormone replacement therapy and supplements? Hormone therapy products are effective for treating symptoms that are associated with menopause, such as hot flashes and night sweats. Hormone replacement carries certain risks, especially as you become older. If you are thinking about using estrogen or estrogen with progestin treatments, discuss the benefits and risks with your health care provider. What should I know about heart disease  and stroke? Heart disease, heart attack, and stroke become more likely as you age. This may be due, in part, to the hormonal changes that your body experiences during menopause. These can affect how your body processes dietary fats, triglycerides, and cholesterol. Heart attack and stroke are both medical emergencies. There are many things that you can do to help prevent heart disease and stroke:  Have your blood pressure checked at least every 1-2 years. High blood pressure causes heart disease and increases the risk of stroke.  If you are 49-63 years old, ask your health care provider if you should take aspirin to prevent a heart attack or a stroke.  Do not use any tobacco products, including cigarettes, chewing tobacco, or electronic cigarettes. If you need help quitting, ask your health care provider.  It is important to eat a healthy diet and maintain a healthy weight. ? Be sure to include plenty of vegetables, fruits, low-fat dairy products, and lean protein. ? Avoid eating foods that are high in solid fats, added sugars, or salt (sodium).  Get regular exercise. This is one of the most important things that you can do for your health. ? Try to exercise for at least 150 minutes each week. The type of exercise that you do should increase your heart rate and make you sweat. This is known as moderate-intensity exercise. ? Try to do strengthening exercises at least twice each week. Do these in addition to the moderate-intensity exercise.  Know your numbers.Ask your health care provider to check your cholesterol and your blood glucose. Continue to have your blood tested as directed  by your health care provider.  What should I know about cancer screening? There are several types of cancer. Take the following steps to reduce your risk and to catch any cancer development as early as possible. Breast Cancer  Practice breast self-awareness. ? This means understanding how your breasts normally  appear and feel. ? It also means doing regular breast self-exams. Let your health care provider know about any changes, no matter how small.  If you are 38 or older, have a clinician do a breast exam (clinical breast exam or CBE) every year. Depending on your age, family history, and medical history, it may be recommended that you also have a yearly breast X-ray (mammogram).  If you have a family history of breast cancer, talk with your health care provider about genetic screening.  If you are at high risk for breast cancer, talk with your health care provider about having an MRI and a mammogram every year.  Breast cancer (BRCA) gene test is recommended for women who have family members with BRCA-related cancers. Results of the assessment will determine the need for genetic counseling and BRCA1 and for BRCA2 testing. BRCA-related cancers include these types: ? Breast. This occurs in males or females. ? Ovarian. ? Tubal. This may also be called fallopian tube cancer. ? Cancer of the abdominal or pelvic lining (peritoneal cancer). ? Prostate. ? Pancreatic.  Cervical, Uterine, and Ovarian Cancer Your health care provider may recommend that you be screened regularly for cancer of the pelvic organs. These include your ovaries, uterus, and vagina. This screening involves a pelvic exam, which includes checking for microscopic changes to the surface of your cervix (Pap test).  For women ages 21-65, health care providers may recommend a pelvic exam and a Pap test every three years. For women ages 39-65, they may recommend the Pap test and pelvic exam, combined with testing for human papilloma virus (HPV), every five years. Some types of HPV increase your risk of cervical cancer. Testing for HPV may also be done on women of any age who have unclear Pap test results.  Other health care providers may not recommend any screening for nonpregnant women who are considered low risk for pelvic cancer and have no  symptoms. Ask your health care provider if a screening pelvic exam is right for you.  If you have had past treatment for cervical cancer or a condition that could lead to cancer, you need Pap tests and screening for cancer for at least 20 years after your treatment. If Pap tests have been discontinued for you, your risk factors (such as having a new sexual partner) need to be reassessed to determine if you should start having screenings again. Some women have medical problems that increase the chance of getting cervical cancer. In these cases, your health care provider may recommend that you have screening and Pap tests more often.  If you have a family history of uterine cancer or ovarian cancer, talk with your health care provider about genetic screening.  If you have vaginal bleeding after reaching menopause, tell your health care provider.  There are currently no reliable tests available to screen for ovarian cancer.  Lung Cancer Lung cancer screening is recommended for adults 16-27 years old who are at high risk for lung cancer because of a history of smoking. A yearly low-dose CT scan of the lungs is recommended if you:  Currently smoke.  Have a history of at least 30 pack-years of smoking and you currently smoke  or have quit within the past 15 years. A pack-year is smoking an average of one pack of cigarettes per day for one year.  Yearly screening should:  Continue until it has been 15 years since you quit.  Stop if you develop a health problem that would prevent you from having lung cancer treatment.  Colorectal Cancer  This type of cancer can be detected and can often be prevented.  Routine colorectal cancer screening usually begins at age 5 and continues through age 48.  If you have risk factors for colon cancer, your health care provider may recommend that you be screened at an earlier age.  If you have a family history of colorectal cancer, talk with your health care  provider about genetic screening.  Your health care provider may also recommend using home test kits to check for hidden blood in your stool.  A small camera at the end of a tube can be used to examine your colon directly (sigmoidoscopy or colonoscopy). This is done to check for the earliest forms of colorectal cancer.  Direct examination of the colon should be repeated every 5-10 years until age 74. However, if early forms of precancerous polyps or small growths are found or if you have a family history or genetic risk for colorectal cancer, you may need to be screened more often.  Skin Cancer  Check your skin from head to toe regularly.  Monitor any moles. Be sure to tell your health care provider: ? About any new moles or changes in moles, especially if there is a change in a mole's shape or color. ? If you have a mole that is larger than the size of a pencil eraser.  If any of your family members has a history of skin cancer, especially at a young age, talk with your health care provider about genetic screening.  Always use sunscreen. Apply sunscreen liberally and repeatedly throughout the day.  Whenever you are outside, protect yourself by wearing long sleeves, pants, a wide-brimmed hat, and sunglasses.  What should I know about osteoporosis? Osteoporosis is a condition in which bone destruction happens more quickly than new bone creation. After menopause, you may be at an increased risk for osteoporosis. To help prevent osteoporosis or the bone fractures that can happen because of osteoporosis, the following is recommended:  If you are 71-39 years old, get at least 1,000 mg of calcium and at least 600 mg of vitamin D per day.  If you are older than age 86 but younger than age 80, get at least 1,200 mg of calcium and at least 600 mg of vitamin D per day.  If you are older than age 78, get at least 1,200 mg of calcium and at least 800 mg of vitamin D per day.  Smoking and excessive  alcohol intake increase the risk of osteoporosis. Eat foods that are rich in calcium and vitamin D, and do weight-bearing exercises several times each week as directed by your health care provider. What should I know about how menopause affects my mental health? Depression may occur at any age, but it is more common as you become older. Common symptoms of depression include:  Low or sad mood.  Changes in sleep patterns.  Changes in appetite or eating patterns.  Feeling an overall lack of motivation or enjoyment of activities that you previously enjoyed.  Frequent crying spells.  Talk with your health care provider if you think that you are experiencing depression. What should I  know about immunizations? It is important that you get and maintain your immunizations. These include:  Tetanus, diphtheria, and pertussis (Tdap) booster vaccine.  Influenza every year before the flu season begins.  Pneumonia vaccine.  Shingles vaccine.  Your health care provider may also recommend other immunizations. This information is not intended to replace advice given to you by your health care provider. Make sure you discuss any questions you have with your health care provider. Document Released: 06/22/2005 Document Revised: 11/18/2015 Document Reviewed: 02/01/2015 Elsevier Interactive Patient Education  2018 [employer].

## 2017-12-18 ENCOUNTER — Inpatient Hospital Stay (HOSPITAL_BASED_OUTPATIENT_CLINIC_OR_DEPARTMENT_OTHER): Payer: BLUE CROSS/BLUE SHIELD | Admitting: Internal Medicine

## 2017-12-18 ENCOUNTER — Inpatient Hospital Stay: Payer: BLUE CROSS/BLUE SHIELD | Attending: Internal Medicine

## 2017-12-18 ENCOUNTER — Other Ambulatory Visit: Payer: Self-pay

## 2017-12-18 VITALS — BP 105/69 | HR 71 | Temp 97.2°F | Resp 16 | Wt 152.4 lb

## 2017-12-18 DIAGNOSIS — N951 Menopausal and female climacteric states: Secondary | ICD-10-CM | POA: Insufficient documentation

## 2017-12-18 DIAGNOSIS — Z9484 Stem cells transplant status: Secondary | ICD-10-CM | POA: Diagnosis not present

## 2017-12-18 DIAGNOSIS — H532 Diplopia: Secondary | ICD-10-CM | POA: Insufficient documentation

## 2017-12-18 DIAGNOSIS — C8333 Diffuse large B-cell lymphoma, intra-abdominal lymph nodes: Secondary | ICD-10-CM | POA: Diagnosis present

## 2017-12-18 DIAGNOSIS — F411 Generalized anxiety disorder: Secondary | ICD-10-CM | POA: Insufficient documentation

## 2017-12-18 DIAGNOSIS — C8338 Diffuse large B-cell lymphoma, lymph nodes of multiple sites: Secondary | ICD-10-CM

## 2017-12-18 LAB — CBC WITH DIFFERENTIAL/PLATELET
Basophils Absolute: 0 10*3/uL (ref 0–0.1)
Basophils Relative: 0 %
Eosinophils Absolute: 0.2 10*3/uL (ref 0–0.7)
Eosinophils Relative: 4 %
HEMATOCRIT: 36 % (ref 35.0–47.0)
HEMOGLOBIN: 12.5 g/dL (ref 12.0–16.0)
LYMPHS ABS: 0.6 10*3/uL — AB (ref 1.0–3.6)
LYMPHS PCT: 11 %
MCH: 32.6 pg (ref 26.0–34.0)
MCHC: 34.7 g/dL (ref 32.0–36.0)
MCV: 94 fL (ref 80.0–100.0)
Monocytes Absolute: 0.6 10*3/uL (ref 0.2–0.9)
Monocytes Relative: 11 %
NEUTROS ABS: 3.9 10*3/uL (ref 1.4–6.5)
Neutrophils Relative %: 74 %
Platelets: 118 10*3/uL — ABNORMAL LOW (ref 150–440)
RBC: 3.83 MIL/uL (ref 3.80–5.20)
RDW: 12.6 % (ref 11.5–14.5)
WBC: 5.3 10*3/uL (ref 3.6–11.0)

## 2017-12-18 LAB — COMPREHENSIVE METABOLIC PANEL
ALT: 12 U/L (ref 0–44)
ANION GAP: 10 (ref 5–15)
AST: 19 U/L (ref 15–41)
Albumin: 4.4 g/dL (ref 3.5–5.0)
Alkaline Phosphatase: 61 U/L (ref 38–126)
BILIRUBIN TOTAL: 0.7 mg/dL (ref 0.3–1.2)
BUN: 16 mg/dL (ref 8–23)
CHLORIDE: 103 mmol/L (ref 98–111)
CO2: 27 mmol/L (ref 22–32)
Calcium: 9.8 mg/dL (ref 8.9–10.3)
Creatinine, Ser: 1.13 mg/dL — ABNORMAL HIGH (ref 0.44–1.00)
GFR calc Af Amer: 59 mL/min — ABNORMAL LOW (ref >60)
GFR calc non Af Amer: 51 mL/min — ABNORMAL LOW (ref >60)
GLUCOSE: 112 mg/dL — AB (ref 70–99)
POTASSIUM: 4.8 mmol/L (ref 3.5–5.1)
Sodium: 140 mmol/L (ref 135–145)
TOTAL PROTEIN: 6.5 g/dL (ref 6.5–8.1)

## 2017-12-18 LAB — LACTATE DEHYDROGENASE: LDH: 131 U/L (ref 98–192)

## 2017-12-18 MED ORDER — LORAZEPAM 0.5 MG PO TABS: 0.5000 mg | ORAL_TABLET | Freq: Once | ORAL | 0 refills | Status: AC | PRN

## 2017-12-18 NOTE — Assessment & Plan Note (Signed)
  LDL and triglycerides are at goal without medication.   No changes today  Lab Results  Component Value Date   CHOL 216 (H) 10/04/2016   HDL 68.20 10/04/2016   LDLCALC 122 (H) 10/04/2016   LDLDIRECT 139.5 07/17/2012   TRIG 127.0 10/04/2016   CHOLHDL 3 10/04/2016

## 2017-12-18 NOTE — Assessment & Plan Note (Addendum)
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  Stable continue surveillance.  #Double vision-improved after correcting the refractory errors.  Currently wearing contact lens.  No concerns for any leptomeningeal disease.  # palpitations-previous work-up negative for arrhythmia.  Likely anxiety.  Improved with ativan; continue ativan one a day; reocmmend speaking to PCP re: GAD  # follow up scan in sep/ [facility] or here. Follow up in [facility] in sep 2019 immunizations.  # follow up in 3 months/labs [cbc/cmp/ldh]  Cc; Dr.Tullo

## 2017-12-18 NOTE — Assessment & Plan Note (Signed)

## 2017-12-18 NOTE — Progress Notes (Signed)
[place] OFFICE PROGRESS NOTE  Patient Care Team: Crecencio Mc, MD as PCP - General (Internal Medicine) Cammie Sickle, MD as Consulting Physician (Internal Medicine) Christene Lye, MD (General Surgery) [name], MD as Consulting Physician (Cardiology)  Cancer Staging Large cell lymphoma of intra-abdominal lymph nodes [facility]) Staging form: Lymphoid Neoplasms, AJCC 6th Edition - Clinical stage from 09/13/2015: Stage IV - Signed by Cammie Sickle, MD on 10/06/2015    Oncology History   #MAY 2017- LARGE B CELL LYMPHOMA with intravascular features STAGE IV- [BMBx- hypercellular- lymphoproliferative process is mostly seen within small vessels in the bone marrow as well as the surrounding interstitium associated with circulating lymphoma cells in the peripheral blood; ]. CT- 1-2 CM LN subpectoral/medistinal/ retro-peritoneal/pelvic/ Right inguinal LN 1.6cm- Bx- DLBCL- ABC; myc-POS; FISH gene re-arragement-NEG. PET- MULTIPLE LN/ Bone involvement; R-CHOP x6- CR'# OCT 2017- PET scan- CR; DEC 22nd 2017- BMBx- "atypical large B cells- <1%  [[facility]; II opinion- Dr.Grover- review of path- not concerning for residual lymphoma]; recommend surviellance  # March 26th  2018PET- NED  # Lumbar puncture- difficult-spinal headache. S/p high dose MXT x4.  ------------------------------------------------------------------- # NOV 2018-recurrent diffuse large B-cell lymphoma [right axilla lymph node biopsy proven]; PET- multiple bone lesions; axillary adenopathy; splenomegaly uptake  # Nov 16th 2018- R-; s/p 3 cycles-CR' Auto-Stem cell Transplant date:07/11/17 [facility]; Dr.Vincent]; May 1st 2019- PET CR.  ---------------------------------------------------------- # June 2017-Elevated LFTs- valacylovir/diflucan; resolved.   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  Patient double vision is improved.  No headaches.  No neck stiffness.  She has been using contact lenses now.  Patient has episodes of palpitations.  She also has episodes of anxiety needing to take Ativan.  Review of Systems  Constitutional: Negative for chills, diaphoresis, fever, malaise/fatigue and weight loss.  HENT: Negative for nosebleeds and sore throat.   Eyes: Negative for double vision.  Respiratory: Negative for cough, hemoptysis, sputum production, shortness of breath and wheezing.   Cardiovascular: Positive for palpitations. Negative for chest pain, orthopnea and leg swelling.  Gastrointestinal: Negative for abdominal pain, blood in stool, constipation, diarrhea, heartburn, melena, nausea and vomiting.  Genitourinary: Negative for dysuria, frequency and urgency.  Musculoskeletal: Negative for back pain and joint pain.  Skin: Negative.  Negative for itching and rash.  Neurological: Negative for dizziness, tingling, focal weakness, weakness and headaches.  Endo/Heme/Allergies: Does not bruise/bleed easily.  Psychiatric/Behavioral: Negative for depression. The patient is nervous/anxious. The patient does not have insomnia.       PAST MEDICAL HISTORY :  Past Medical History:  Diagnosis Date  . Arthritis   . Blood dyscrasia   . Cancer ([place])   . Chest pain, unspecified   . Diverticulosis 07/06/15  . Dysrhythmia    tachycardia  . Esophagitis 07/06/15  . Fatigue   . Gastritis 07/06/15  . GERD (gastroesophageal reflux disease)    . 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 Leg  cramps   . Lymphoma ([place]) 09/14/15   Monoclonal B cell lymphoma  . Night sweats   . Osteoporosis   . Overweight(278.02)    Obesity  . Palpitations   . Shortness of breath dyspnea   . Tachycardia   . Thrombocytopenia ([place])     PAST SURGICAL HISTORY :   Past Surgical History:  Procedure Laterality Date  . ABDOMINAL HYSTERECTOMY  2005  . BONE MARROW BIOPSY  09/14/15  . CHOLECYSTECTOMY  1997  . COLONOSCOPY  07/05/2014  . ESOPHAGOGASTRODUODENOSCOPY  07/05/2014  . INGUINAL LYMPH NODE BIOPSY Right 10/05/2015   Procedure: INGUINAL LYMPH NODE BIOPSY;  Surgeon: Christene Lye, MD;  Location: [facility] ORS;  Service: General;  Laterality: Right;  . PERIPHERAL VASCULAR CATHETERIZATION N/A 09/27/2015   Procedure: Glori Luis Cath Insertion;  Surgeon: Algernon Huxley, MD;  Location: [place] [facility];  Service: Cardiovascular;  Laterality: N/A;  . PORTA CATH REMOVAL Right 06/03/2017   Procedure: PORTA CATH REMOVAL, with venous thrombectomy;  Surgeon: Algernon Huxley, MD;  Location: [place] [facility];  Service: Cardiovascular;  Laterality: Right;  . PORTACATH PLACEMENT Right     FAMILY HISTORY :   Family History  Problem Relation Age of Onset  . Heart disease Father        CABG x 4  . Multiple myeloma Father   . Hypertension Mother   . Pancreatic cancer Mother   . Ulcers Mother   . Asthma Mother   . Brain cancer Maternal Uncle   . Multiple myeloma Maternal Uncle   . Diabetes Neg Hx   . Breast cancer Neg Hx     SOCIAL HISTORY:   Social History   Tobacco Use  . Smoking status: Never Smoker  . Smokeless tobacco: Never Used  Substance Use Topics  . Alcohol use: No    Alcohol/week: 0.0 standard drinks  . Drug use: No    ALLERGIES:  is allergic to ondansetron hcl; oxycodone; and rituximab.  MEDICATIONS:  Current Outpatient Medications  Medication Sig Dispense Refill  . acetaminophen (TYLENOL 8 HOUR) 650 MG CR tablet Take 650 mg by mouth every 8  (eight) hours as needed for pain.    Marland Kitchen acyclovir (ZOVIRAX) 400 MG tablet TAKE 1 TABLET BY [facility] TWICE DAILY. TAKE 1 TABLET A DAY (TO PREVENT SHINGLES) 60 tablet 3  . LORazepam (ATIVAN) 0.5 MG tablet Take 1 tablet (0.5 mg total) by mouth once as needed for anxiety. 30 tablet 0  . ondansetron (ZOFRAN) 8 MG tablet Take 1 tablet (8 mg total) 2 (two) times daily as needed by mouth. Start on the 3rd day after cisplatin chemotherapy. 30 tablet 1  . ranitidine (ZANTAC) 150 MG tablet Take 150 mg by mouth 2 (two) times daily.    Marland Kitchen XIIDRA 5 % SOLN 1 drop.   4   No current facility-administered medications for this visit.    Facility-Administered Medications Ordered in Other Visits  Medication Dose Route Frequency Provider Last Rate Last Dose  . 0.9 %  sodium chloride infusion   Intravenous Continuous Evlyn Kanner, NP   Stopped at 10/14/15 1312  . methotrexate (50 mg/ml) 6.3 g in sodium chloride 0.9 % 1,000 mL injection   Intravenous Once Charlaine Dalton R, MD      . sodium chloride flush (NS) 0.9 % injection 10 mL  10 mL Intravenous PRN Cammie Sickle, MD   10 mL at 10/05/16 1400    PHYSICAL EXAMINATION: ECOG PERFORMANCE STATUS: 0 - Asymptomatic  BP 105/69 (BP Location: Left Arm, Patient Position: Sitting)   Pulse 71   Temp (!) 97.2 F (36.2 C) (Tympanic)   Resp 16   Wt 152 lb 6.4 oz (69.1 kg)   BMI 27.87 kg/m   Filed Weights   12/18/17 1129  Weight: 152 lb 6.4 oz (69.1 kg)    GENERAL: Well-nourished well-developed; Alert, no distress and comfortable.  Accompanied with husband.  EYES: no pallor or icterus OROPHARYNX: no thrush or ulceration; NECK: supple; no lymph nodes felt. LYMPH:  no palpable lymphadenopathy in the axillary or inguinal regions LUNGS: Decreased breath sounds auscultation bilaterally. No wheeze or crackles HEART/CVS: regular rate & rhythm and no murmurs; No lower extremity edema ABDOMEN:abdomen soft, non-tender and normal bowel sounds. No hepatomegaly or  splenomegaly.  Musculoskeletal:no cyanosis of digits and no clubbing  PSYCH: alert & oriented x 3 with fluent speech NEURO: no focal motor/sensory deficits SKIN:  no rashes or significant lesions    LABORATORY DATA:  I have reviewed the data as listed    Component Value Date/Time   NA 140 12/18/2017 1053   NA 139 06/29/2010 0749   K 4.8 12/18/2017 1053   CL 103 12/18/2017 1053   CO2 27 12/18/2017 1053   GLUCOSE 112 (H) 12/18/2017 1053   BUN 16 12/18/2017 1053   BUN 16 06/29/2010 0749   CREATININE 1.13 (H) 12/18/2017 1053   CALCIUM 9.8 12/18/2017 1053   PROT 6.5 12/18/2017 1053   ALBUMIN 4.4 12/18/2017 1053   AST 19 12/18/2017 1053   ALT 12 12/18/2017 1053   ALKPHOS 61 12/18/2017 1053   BILITOT 0.7 12/18/2017 1053   GFRNONAA 51 (L) 12/18/2017 1053   GFRAA 59 (L) 12/18/2017 1053    No results found for: SPEP, UPEP  Lab Results  Component Value Date   WBC 5.3 12/18/2017   NEUTROABS 3.9 12/18/2017   HGB 12.5 12/18/2017   HCT 36.0 12/18/2017   MCV 94.0 12/18/2017   PLT 118 (L) 12/18/2017      Chemistry      Component Value Date/Time   NA 140 12/18/2017 1053   NA 139 06/29/2010 0749   K 4.8 12/18/2017 1053   CL 103 12/18/2017 1053   CO2 27 12/18/2017 1053   BUN 16 12/18/2017 1053   BUN 16 06/29/2010 0749   CREATININE 1.13 (H) 12/18/2017 1053   GLU 93 06/29/2010 0749      Component Value Date/Time   CALCIUM 9.8 12/18/2017 1053   ALKPHOS 61 12/18/2017 1053   AST 19 12/18/2017 1053   ALT 12 12/18/2017 1053   BILITOT 0.7 12/18/2017 1053       RADIOGRAPHIC STUDIES: I have personally reviewed the radiological images as listed and agreed with the findings in the report. No results found.   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.  Stable continue surveillance.  #Double vision-improved after  correcting the refractory errors.  Currently wearing contact lens.  No concerns for any leptomeningeal disease.  # palpitations-previous work-up negative for arrhythmia.  Likely anxiety.  Improved with ativan; continue ativan one a day; reocmmend speaking to PCP re: GAD  # follow up scan in sep/ [facility] or here. Follow up in [facility] in sep 2019 immunizations.  # follow up in 3 months/labs [cbc/cmp/ldh]  Cc; Dr.Tullo    Orders Placed This Encounter  Procedures  . CBC  with Differential    Standing Status:   Future    Standing Expiration Date:   12/19/2018  . Comprehensive metabolic panel    Standing Status:   Future    Standing Expiration Date:   12/19/2018  . Lactate dehydrogenase    Standing Status:   Future    Standing Expiration Date:   12/19/2018   All questions were answered. The patient knows to call the clinic with any problems, questions or concerns.      Cammie Sickle, MD 12/22/2017 8:16 PM

## 2017-12-18 NOTE — Assessment & Plan Note (Signed)
She has C/I to treatment with estrogen given her history of DVT

## 2017-12-18 NOTE — Assessment & Plan Note (Signed)
Diagnosed in 2017 , treated with CHOP,  Recurrent noted in Feb 2019.   Now s/o autologous stem celll transplant  with remission noted by serial PET scans .

## 2017-12-19 LAB — CYTOLOGY - PAP
Diagnosis: NEGATIVE
HPV: NOT DETECTED

## 2017-12-25 ENCOUNTER — Other Ambulatory Visit: Payer: Self-pay | Admitting: Internal Medicine

## 2017-12-25 MED ORDER — ESCITALOPRAM OXALATE 10 MG PO TABS: 10.0000 mg | ORAL_TABLET | Freq: Every day | ORAL | 2 refills | Status: DC

## 2018-01-15 DIAGNOSIS — Z9484 Stem cells transplant status: Secondary | ICD-10-CM | POA: Insufficient documentation

## 2018-01-15 DIAGNOSIS — Z23 Encounter for immunization: Secondary | ICD-10-CM | POA: Insufficient documentation

## 2018-01-16 ENCOUNTER — Other Ambulatory Visit: Payer: Self-pay | Admitting: *Deleted

## 2018-01-17 MED ORDER — ACYCLOVIR 400 MG PO TABS: ORAL_TABLET | ORAL | 3 refills | Status: DC

## 2018-01-27 IMAGING — CR DG CHEST 2V
1 series · 2 of 2 positions shown · non-contrast
Comparison: None.

CLINICAL DATA: Irregular heartbeat for 2 weeks.  Weakness.

EXAM:
CHEST  2 VIEW

[Series 1: w chest pa · 0.14mm/px · 2 of 2 slices shown]
[im 1/2]
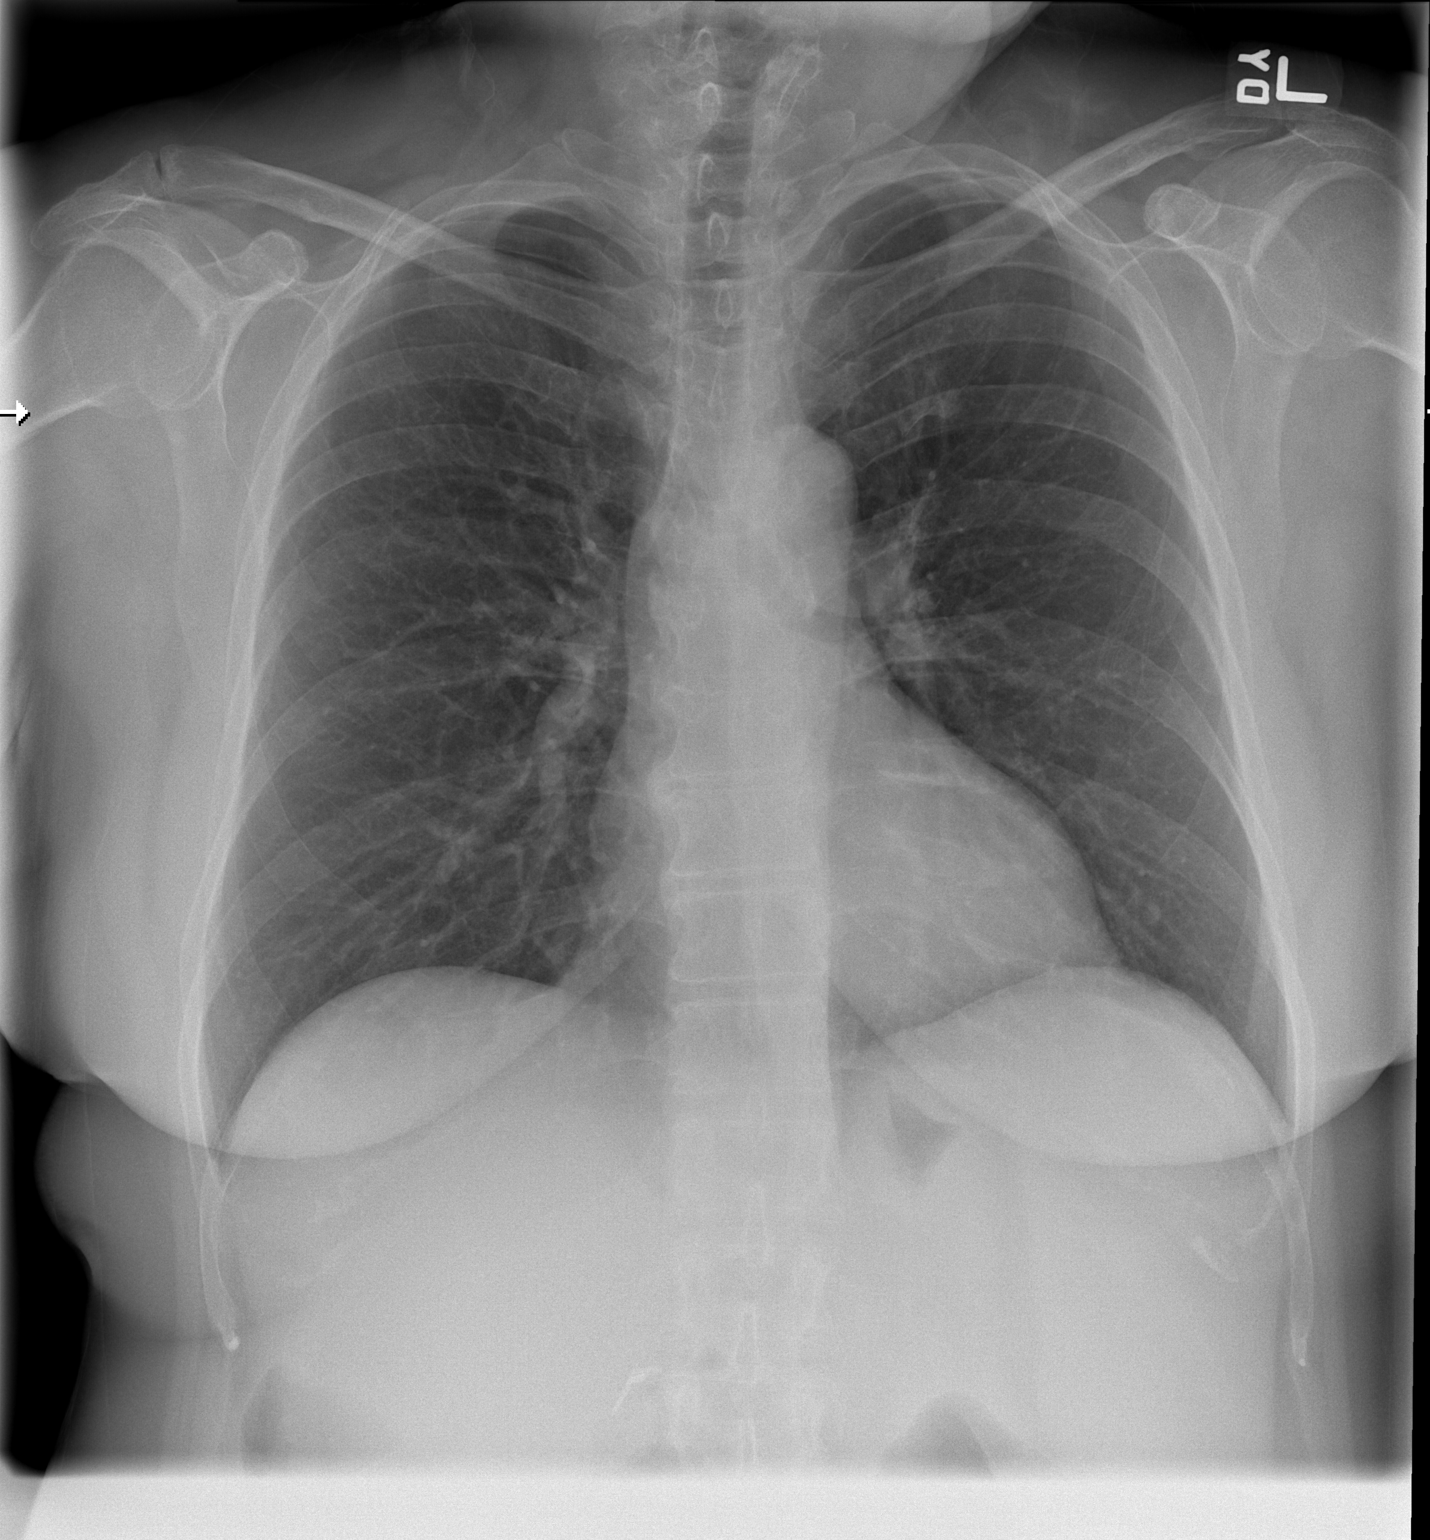
[im 2/2]
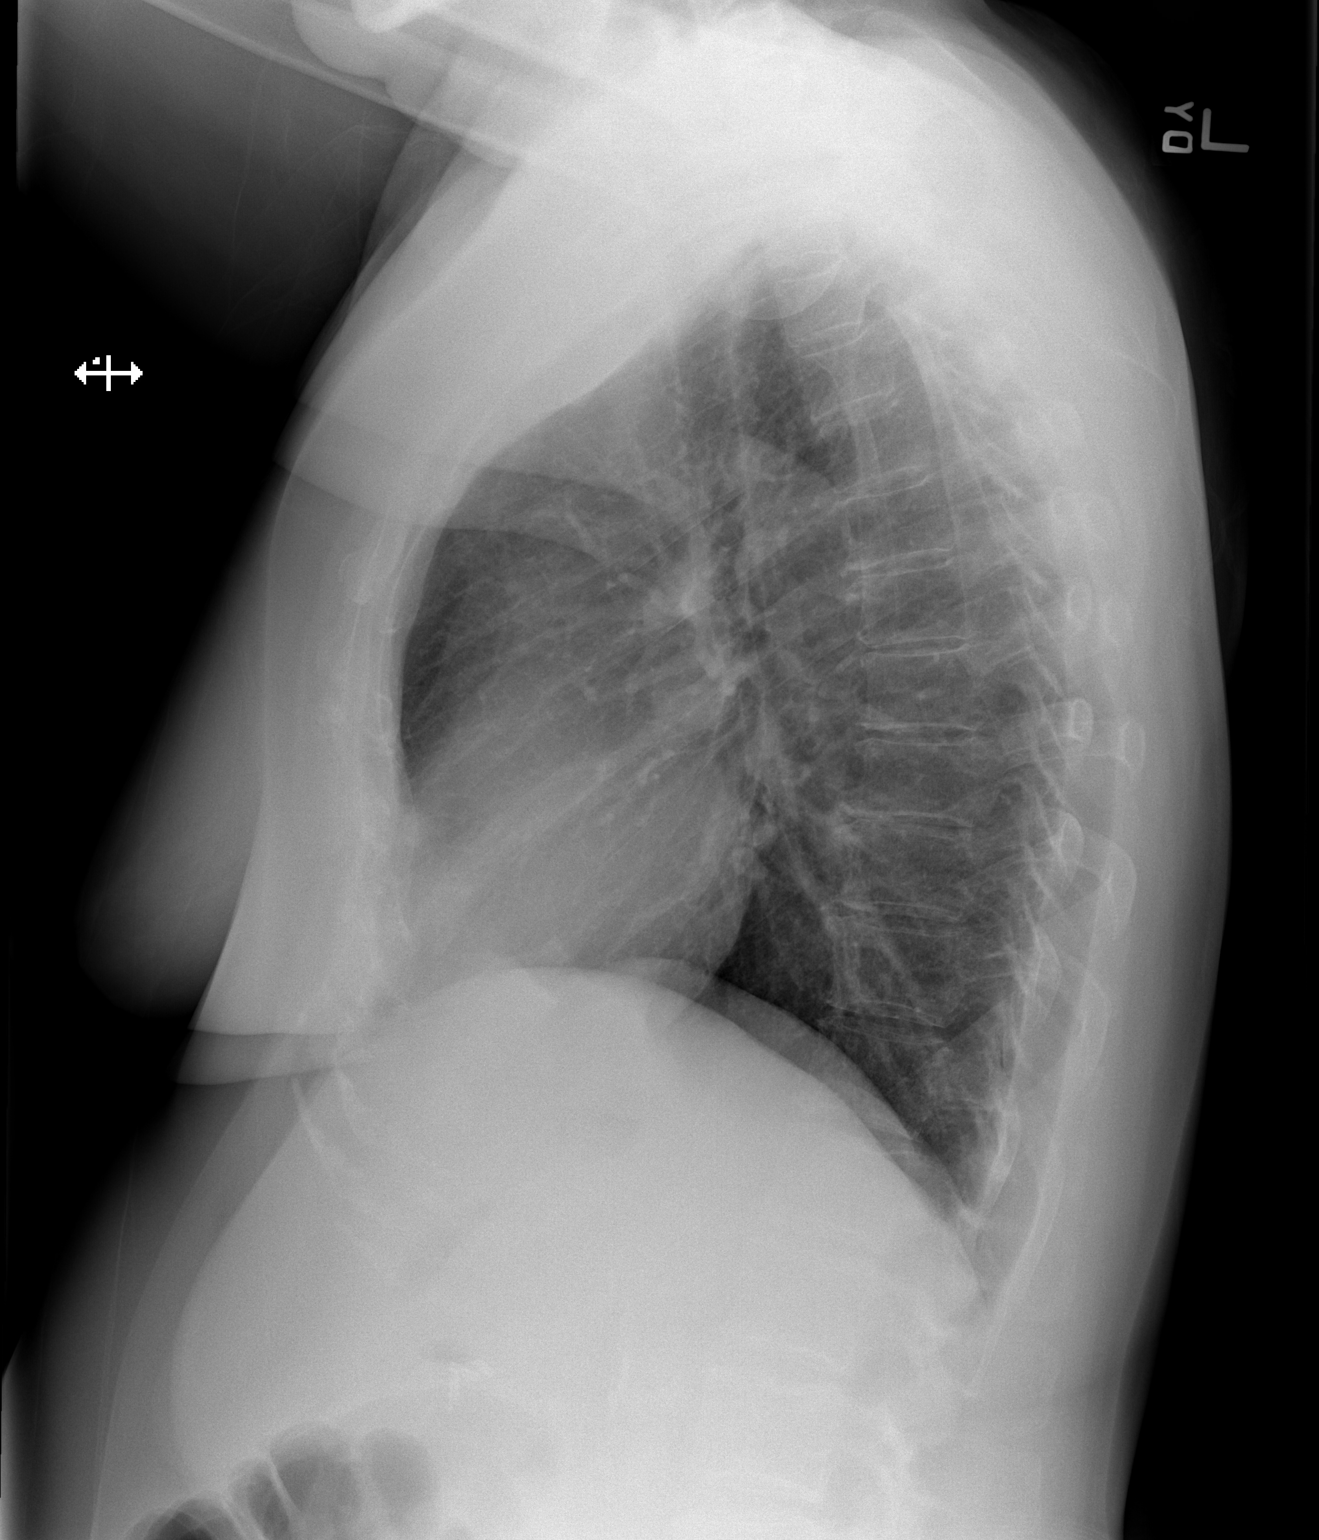

[2 of 2 positions shown; findings below may reference images not displayed]

FINDINGS: The cardiac silhouette, mediastinal and hilar contours are normal.
The lungs are clear. No pleural effusion. The bony thorax is intact.
IMPRESSION: No acute cardiopulmonary findings.

## 2018-02-07 ENCOUNTER — Encounter: Payer: Self-pay | Admitting: Internal Medicine

## 2018-02-07 ENCOUNTER — Ambulatory Visit (INDEPENDENT_AMBULATORY_CARE_PROVIDER_SITE_OTHER): Payer: BLUE CROSS/BLUE SHIELD | Admitting: Internal Medicine

## 2018-02-07 DIAGNOSIS — F411 Generalized anxiety disorder: Secondary | ICD-10-CM | POA: Diagnosis not present

## 2018-02-07 NOTE — Patient Instructions (Signed)
Glad the Lexapro is helping  If you decide to stop it,  Use a taper  2 week or 4 week:  1/2 tablet daily for 1-2 weeks  1/2 tablet every other day for 1-2 weeks,  Then stop  See you in a year

## 2018-02-07 NOTE — Progress Notes (Signed)
Subjective:  Patient ID: Stacy Murillo, female    DOB: 1955-06-02  Age: 62 y.o. MRN: [id number]  CC: The encounter diagnosis was Generalized anxiety disorder.  HPI Stacy Murillo presents for follow up  On anxiety discussed at her annual CPE 6 weeks ago.    She is tolerating the Lexapro .  States that she started t feel better at week 4 .   Less anxious on a daily basis,  no palpitations. No side effects .  Sleeping better   Outpatient Medications Prior to Visit  Medication Sig Dispense Refill  . acetaminophen (TYLENOL 8 HOUR) 650 MG CR tablet Take 650 mg by mouth every 8 (eight) hours as needed for pain.    Marland Kitchen acyclovir (ZOVIRAX) 400 MG tablet TAKE 1 TABLET BY [facility] TWICE DAILY. TAKE 1 TABLET A DAY (TO PREVENT SHINGLES) 60 tablet 3  . escitalopram (LEXAPRO) 10 MG tablet Take 1 tablet (10 mg total) by mouth daily. 30 tablet 2  . ranitidine (ZANTAC) 150 MG tablet Take 150 mg by mouth 2 (two) times daily.    . ondansetron (ZOFRAN) 8 MG tablet Take 1 tablet (8 mg total) 2 (two) times daily as needed by mouth. Start on the 3rd day after cisplatin chemotherapy. (Patient not taking: Reported on 02/07/2018) 30 tablet 1  . XIIDRA 5 % SOLN 1 drop.   4   Facility-Administered Medications Prior to Visit  Medication Dose Route Frequency Provider Last Rate Last Dose  . 0.9 %  sodium chloride infusion   Intravenous Continuous Evlyn Kanner, NP   Stopped at 10/14/15 1312  . methotrexate (50 mg/ml) 6.3 g in sodium chloride 0.9 % 1,000 mL injection   Intravenous Once Charlaine Dalton R, MD      . sodium chloride flush (NS) 0.9 % injection 10 mL  10 mL Intravenous PRN Cammie Sickle, MD   10 mL at 10/05/16 1400    Review of Systems;  Patient denies headache, fevers, malaise, unintentional weight loss, skin rash, eye pain, sinus congestion and sinus pain, sore throat, dysphagia,  hemoptysis , cough, dyspnea, wheezing, chest pain, palpitations, orthopnea, edema, abdominal pain, nausea, melena,  diarrhea, constipation, flank pain, dysuria, hematuria, urinary  Frequency, nocturia, numbness, tingling, seizures,  Focal weakness, Loss of consciousness,  Tremor, insomnia, depression, anxiety, and suicidal ideation.      Objective:  BP 122/70 (BP Location: Left Arm, Patient Position: Sitting, Cuff Size: Normal)   Pulse 77   Temp 98.2 F (36.8 C) (Oral)   Resp 15   Ht 5\' 2"  (1.575 m)   Wt 151 lb 6.4 oz (68.7 kg)   SpO2 99%   BMI 27.69 kg/m   BP Readings from Last 3 Encounters:  02/07/18 122/70  12/18/17 105/69  12/17/17 112/76    Wt Readings from Last 3 Encounters:  02/07/18 151 lb 6.4 oz (68.7 kg)  12/18/17 152 lb 6.4 oz (69.1 kg)  12/17/17 153 lb 3.2 oz (69.5 kg)    General appearance: alert, cooperative and appears stated age Back: symmetric, no curvature. ROM normal. No CVA tenderness. Lungs: clear to auscultation bilaterally Heart: regular rate and rhythm, S1, S2 normal, no murmur, click, rub or gallop Psych: affect normal, makes good eye contact. No fidgeting,  Smiles easily.  Denies suicidal thoughts   Lab Results  Component Value Date   HGBA1C 5.1 12/17/2017   HGBA1C 5.5 08/18/2014    Lab Results  Component Value Date   CREATININE 1.13 (H) 12/18/2017   CREATININE  1.13 (H) 11/13/2017   CREATININE 1.08 (H) 10/16/2017    Lab Results  Component Value Date   WBC 5.3 12/18/2017   HGB 12.5 12/18/2017   HCT 36.0 12/18/2017   PLT 118 (L) 12/18/2017   GLUCOSE 112 (H) 12/18/2017   CHOL 216 (H) 10/04/2016   TRIG 127.0 10/04/2016   HDL 68.20 10/04/2016   LDLDIRECT 139.5 07/17/2012   LDLCALC 122 (H) 10/04/2016   ALT 12 12/18/2017   AST 19 12/18/2017   NA 140 12/18/2017   K 4.8 12/18/2017   CL 103 12/18/2017   CREATININE 1.13 (H) 12/18/2017   BUN 16 12/18/2017   CO2 27 12/18/2017   TSH 1.68 10/04/2016   INR 0.89 06/02/2017   HGBA1C 5.1 12/17/2017   MICROALBUR 0.4 08/11/2013    No results found.  Assessment & Plan:   Problem List Items Addressed  This Visit    Generalized anxiety disorder    Improved with Lexapro.  No side effects perceived.  .  Continue current medication          I have discontinued Stacy Murillo's XIIDRA and ondansetron. I am also having her maintain her ranitidine, acetaminophen, escitalopram, and acyclovir.  No orders of the defined types were placed in this encounter.   Medications Discontinued During This Encounter  Medication Reason  . ondansetron (ZOFRAN) 8 MG tablet Patient has not taken in last 30 days  . XIIDRA 5 % SOLN Patient has not taken in last 30 days    Follow-up: Return in about 1 year (around 02/08/2019).   Crecencio Mc, MD

## 2018-02-09 DIAGNOSIS — F411 Generalized anxiety disorder: Secondary | ICD-10-CM | POA: Insufficient documentation

## 2018-02-09 NOTE — Assessment & Plan Note (Signed)
Improved with Lexapro.  No side effects perceived.  .  Continue current medication

## 2018-02-21 ENCOUNTER — Other Ambulatory Visit: Payer: Self-pay | Admitting: Internal Medicine

## 2018-03-19 ENCOUNTER — Inpatient Hospital Stay (HOSPITAL_BASED_OUTPATIENT_CLINIC_OR_DEPARTMENT_OTHER): Payer: BLUE CROSS/BLUE SHIELD | Admitting: Internal Medicine

## 2018-03-19 ENCOUNTER — Encounter: Payer: Self-pay | Admitting: Internal Medicine

## 2018-03-19 ENCOUNTER — Inpatient Hospital Stay: Payer: BLUE CROSS/BLUE SHIELD | Attending: Internal Medicine

## 2018-03-19 VITALS — BP 130/84 | HR 72 | Temp 97.8°F | Resp 18 | Wt 153.1 lb

## 2018-03-19 DIAGNOSIS — H532 Diplopia: Secondary | ICD-10-CM | POA: Insufficient documentation

## 2018-03-19 DIAGNOSIS — Z9484 Stem cells transplant status: Secondary | ICD-10-CM | POA: Diagnosis not present

## 2018-03-19 DIAGNOSIS — Z86718 Personal history of other venous thrombosis and embolism: Secondary | ICD-10-CM | POA: Diagnosis not present

## 2018-03-19 DIAGNOSIS — D696 Thrombocytopenia, unspecified: Secondary | ICD-10-CM | POA: Insufficient documentation

## 2018-03-19 DIAGNOSIS — C8338 Diffuse large B-cell lymphoma, lymph nodes of multiple sites: Secondary | ICD-10-CM | POA: Diagnosis present

## 2018-03-19 DIAGNOSIS — C8583 Other specified types of non-Hodgkin lymphoma, intra-abdominal lymph nodes: Secondary | ICD-10-CM

## 2018-03-19 DIAGNOSIS — Z7901 Long term (current) use of anticoagulants: Secondary | ICD-10-CM

## 2018-03-19 DIAGNOSIS — Z79899 Other long term (current) drug therapy: Secondary | ICD-10-CM

## 2018-03-19 LAB — LACTATE DEHYDROGENASE: LDH: 124 U/L (ref 98–192)

## 2018-03-19 LAB — CBC WITH DIFFERENTIAL/PLATELET
Abs Immature Granulocytes: 0.02 10*3/uL (ref 0.00–0.07)
BASOS PCT: 0 %
Basophils Absolute: 0 10*3/uL (ref 0.0–0.1)
EOS ABS: 0.1 10*3/uL (ref 0.0–0.5)
Eosinophils Relative: 2 %
HCT: 38.6 % (ref 36.0–46.0)
Hemoglobin: 12.8 g/dL (ref 12.0–15.0)
IMMATURE GRANULOCYTES: 0 %
Lymphocytes Relative: 16 %
Lymphs Abs: 0.8 10*3/uL (ref 0.7–4.0)
MCH: 31 pg (ref 26.0–34.0)
MCHC: 33.2 g/dL (ref 30.0–36.0)
MCV: 93.5 fL (ref 80.0–100.0)
MONOS PCT: 7 %
Monocytes Absolute: 0.3 10*3/uL (ref 0.1–1.0)
NEUTROS PCT: 75 %
NRBC: 0 % (ref 0.0–0.2)
Neutro Abs: 3.4 10*3/uL (ref 1.7–7.7)
PLATELETS: 130 10*3/uL — AB (ref 150–400)
RBC: 4.13 MIL/uL (ref 3.87–5.11)
RDW: 12 % (ref 11.5–15.5)
WBC: 4.6 10*3/uL (ref 4.0–10.5)

## 2018-03-19 LAB — COMPREHENSIVE METABOLIC PANEL
ALT: 10 U/L (ref 0–44)
ANION GAP: 9 (ref 5–15)
AST: 17 U/L (ref 15–41)
Albumin: 4.7 g/dL (ref 3.5–5.0)
Alkaline Phosphatase: 62 U/L (ref 38–126)
BUN: 17 mg/dL (ref 8–23)
CALCIUM: 10.4 mg/dL — AB (ref 8.9–10.3)
CHLORIDE: 100 mmol/L (ref 98–111)
CO2: 29 mmol/L (ref 22–32)
CREATININE: 1.07 mg/dL — AB (ref 0.44–1.00)
GFR, EST NON AFRICAN AMERICAN: 54 mL/min — AB (ref >60)
Glucose, Bld: 102 mg/dL — ABNORMAL HIGH (ref 70–99)
Potassium: 4.9 mmol/L (ref 3.5–5.1)
SODIUM: 138 mmol/L (ref 135–145)
Total Bilirubin: 0.9 mg/dL (ref 0.3–1.2)
Total Protein: 6.5 g/dL (ref 6.5–8.1)

## 2018-03-19 NOTE — Progress Notes (Signed)
Pt in for follow up denies any difficulties or concerns today.  

## 2018-03-19 NOTE — Assessment & Plan Note (Addendum)
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   # Continue surveillance; no obvious progression/recurrence noted/see discussion below  #Thrombocytopenia mild.  Intermittent.  138.  However recently in September platelets in [facility] 154.  # slightly elevted ca-10.6 normal is 10.4.  Doubt related to malignancy related hypercalcemia.  Recheck ca-/cbc in 2 weeks.   #Double vision-improved after correcting the refractory errors-stable.  # s/p [facility] in sep 2019 immunizations.  # DISPOSITION:  # repeat CBC/bmp/ldh in 2 weeks [calcium] # follow up in 3 months/labs [cbc/cmp/ldh]- Dr.B  Cc; Dr.Tullo

## 2018-03-19 NOTE — Progress Notes (Signed)
[place] OFFICE PROGRESS NOTE  Patient Care Team: Crecencio Mc, MD as PCP - General (Internal Medicine) Cammie Sickle, MD as Consulting Physician (Internal Medicine) Christene Lye, MD (General Surgery) [name], MD as Consulting Physician (Cardiology)  Cancer Staging Large cell lymphoma of intra-abdominal lymph nodes [facility]) Staging form: Lymphoid Neoplasms, AJCC 6th Edition - Clinical stage from 09/13/2015: Stage IV - Signed by Cammie Sickle, MD on 10/06/2015    Oncology History   #MAY 2017- LARGE B CELL LYMPHOMA with intravascular features STAGE IV- [BMBx- hypercellular- lymphoproliferative process is mostly seen within small vessels in the bone marrow as well as the surrounding interstitium associated with circulating lymphoma cells in the peripheral blood; ]. CT- 1-2 CM LN subpectoral/medistinal/ retro-peritoneal/pelvic/ Right inguinal LN 1.6cm- Bx- DLBCL- ABC; myc-POS; FISH gene re-arragement-NEG. PET- MULTIPLE LN/ Bone involvement; R-CHOP x6- CR'# OCT 2017- PET scan- CR; DEC 22nd 2017- BMBx- "atypical large B cells- <1%  [[facility]; II opinion- Dr.Grover- review of path- not concerning for residual lymphoma]; recommend surviellance  # March 26th  2018PET- NED  # Lumbar puncture- difficult-spinal headache. S/p high dose MXT x4.  ------------------------------------------------------------------- # NOV 2018-recurrent diffuse large B-cell lymphoma [right axilla lymph node biopsy proven]; PET- multiple bone lesions; axillary adenopathy; splenomegaly uptake  # Nov 16th 2018- R-; s/p 3 cycles-CR' Auto-Stem cell Transplant date:07/11/17 [facility]; Dr.Vincent]; May 1st 2019- PET CR.  ---------------------------------------------------------- # June 2017-Elevated LFTs- valacylovir/diflucan; resolved.   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  Her double vision is improved.  She continues to wear prism.  Patient was evaluated at [facility] in [place] booster injections/vaccinations.  Denies any unusual leg cramps.  Denies any unusual aches and pains.  No nausea no vomiting.  No headaches.  Review of Systems  Constitutional: Negative for chills, diaphoresis, fever, malaise/fatigue and weight loss.  HENT: Negative for nosebleeds and sore throat.   Eyes: Negative for double vision.  Respiratory: Negative for cough, hemoptysis, sputum production, shortness of breath and wheezing.   Cardiovascular: Negative for chest pain, orthopnea and leg swelling.  Gastrointestinal: Negative for abdominal pain, blood in stool, constipation, diarrhea, heartburn, melena, nausea and vomiting.  Genitourinary: Negative for dysuria, frequency and urgency.  Musculoskeletal: Negative for back pain and joint pain.  Skin: Negative.  Negative for itching and rash.  Neurological: Negative for dizziness, tingling, focal weakness, weakness and headaches.  Endo/Heme/Allergies: Does not bruise/bleed easily.  Psychiatric/Behavioral: Negative for depression. The patient does not have insomnia.       PAST MEDICAL HISTORY :  Past Medical History:  Diagnosis Date  . Arthritis   . Blood dyscrasia   . Cancer ([place])   . Chest pain, unspecified   . Diverticulosis 07/06/15  . Dysrhythmia    tachycardia  . Esophagitis 07/06/15  . Fatigue   . Gastritis 07/06/15  . GERD (gastroesophageal reflux disease)   .  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 Leg cramps   .  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 Night sweats   . Osteoporosis   . Overweight(278.02)    Obesity  . Palpitations   . Shortness of breath dyspnea   . Tachycardia   . Thrombocytopenia ([place])     PAST SURGICAL HISTORY :   Past Surgical History:  Procedure Laterality Date  . ABDOMINAL HYSTERECTOMY  2005  . BONE MARROW BIOPSY  09/14/15  . CHOLECYSTECTOMY  1997  . COLONOSCOPY  07/05/2014  . ESOPHAGOGASTRODUODENOSCOPY  07/05/2014  . INGUINAL LYMPH NODE BIOPSY Right 10/05/2015   Procedure: INGUINAL LYMPH NODE BIOPSY;  Surgeon: Christene Lye, MD;  Location: [facility] ORS;  Service: General;  Laterality: Right;  . PERIPHERAL VASCULAR CATHETERIZATION N/A 09/27/2015   Procedure: Glori Luis Cath Insertion;  Surgeon: Algernon Huxley, MD;  Location: [facility] CV LAB;  Service: Cardiovascular;  Laterality: N/A;  . PORTA CATH REMOVAL Right 06/03/2017   Procedure: PORTA CATH REMOVAL, with venous thrombectomy;  Surgeon: Algernon Huxley, MD;  Location: [facility] CV LAB;  Service: Cardiovascular;  Laterality: Right;  . PORTACATH PLACEMENT Right     FAMILY HISTORY :   Family History  Problem Relation Age of Onset  . Heart disease Father        CABG x 4  . Multiple myeloma Father   . Hypertension Mother   . Pancreatic cancer Mother   . Ulcers Mother   . Asthma Mother   . Brain cancer Maternal Uncle   . Multiple myeloma Maternal Uncle   . Diabetes Neg Hx   . Breast cancer Neg Hx     SOCIAL HISTORY:   Social History   Tobacco Use  . Smoking status: Never Smoker  . Smokeless tobacco: Never Used  Substance Use Topics  . Alcohol use: No    Alcohol/week: 0.0 standard drinks  . Drug use: No    ALLERGIES:  is allergic to ondansetron hcl; oxycodone; and rituximab.  MEDICATIONS:  Current Outpatient Medications  Medication Sig Dispense Refill  . acetaminophen (TYLENOL 8 HOUR) 650 MG CR tablet Take 650 mg by mouth every 8  (eight) hours as needed for pain.    Marland Kitchen acyclovir (ZOVIRAX) 400 MG tablet TAKE 1 TABLET BY [facility] TWICE DAILY. TAKE 1 TABLET A DAY (TO PREVENT SHINGLES) 60 tablet 3  . escitalopram (LEXAPRO) 10 MG tablet TAKE ONE TABLET EVERY DAY 30 tablet 2   No current facility-administered medications for this visit.    Facility-Administered Medications Ordered in Other Visits  Medication Dose Route Frequency Provider Last Rate Last Dose  . 0.9 %  sodium chloride infusion   Intravenous Continuous Evlyn Kanner, NP   Stopped at 10/14/15 1312  . methotrexate (50 mg/ml) 6.3 g in sodium chloride 0.9 % 1,000 mL injection   Intravenous Once Charlaine Dalton R, MD      . sodium chloride flush (NS) 0.9 % injection 10 mL  10 mL Intravenous PRN Cammie Sickle, MD   10 mL at 10/05/16 1400    PHYSICAL EXAMINATION: ECOG PERFORMANCE STATUS: 0 - Asymptomatic  BP 130/84 (BP Location: Left Arm, Patient Position: Sitting)   Pulse 72   Temp 97.8 F (36.6 C) (Oral)   Resp 18   Wt 153 lb 2 oz (69.5 kg)   BMI 28.01 kg/m   Filed Weights   03/19/18 1044  Weight: 153 lb 2 oz (69.5 kg)   Physical Exam  Constitutional: She is oriented to person, place, and time and well-developed, well-nourished,  and in no distress.  HENT:  Head: Normocephalic and atraumatic.  Mouth/Throat: Oropharynx is clear and moist. No oropharyngeal exudate.  Eyes: Pupils are equal, round, and reactive to light.  Neck: Normal range of motion. Neck supple.  Cardiovascular: Normal rate and regular rhythm.  Pulmonary/Chest: No respiratory distress. She has no wheezes.  Abdominal: Soft. Bowel sounds are normal. She exhibits no distension and no mass. There is no tenderness. There is no rebound and no guarding.  Musculoskeletal: Normal range of motion. She exhibits no edema or tenderness.  Neurological: She is alert and oriented to person, place, and time.  Skin: Skin is warm.  Psychiatric: Affect normal.       LABORATORY DATA:   I have reviewed the data as listed    Component Value Date/Time   NA 138 03/19/2018 0956   NA 139 06/29/2010 0749   K 4.9 03/19/2018 0956   CL 100 03/19/2018 0956   CO2 29 03/19/2018 0956   GLUCOSE 102 (H) 03/19/2018 0956   BUN 17 03/19/2018 0956   BUN 16 06/29/2010 0749   CREATININE 1.07 (H) 03/19/2018 0956   CALCIUM 10.4 (H) 03/19/2018 0956   PROT 6.5 03/19/2018 0956   ALBUMIN 4.7 03/19/2018 0956   AST 17 03/19/2018 0956   ALT 10 03/19/2018 0956   ALKPHOS 62 03/19/2018 0956   BILITOT 0.9 03/19/2018 0956   GFRNONAA 54 (L) 03/19/2018 0956   GFRAA >60 03/19/2018 0956    No results found for: SPEP, UPEP  Lab Results  Component Value Date   WBC 4.6 03/19/2018   NEUTROABS 3.4 03/19/2018   HGB 12.8 03/19/2018   HCT 38.6 03/19/2018   MCV 93.5 03/19/2018   PLT 130 (L) 03/19/2018      Chemistry      Component Value Date/Time   NA 138 03/19/2018 0956   NA 139 06/29/2010 0749   K 4.9 03/19/2018 0956   CL 100 03/19/2018 0956   CO2 29 03/19/2018 0956   BUN 17 03/19/2018 0956   BUN 16 06/29/2010 0749   CREATININE 1.07 (H) 03/19/2018 0956   GLU 93 06/29/2010 0749      Component Value Date/Time   CALCIUM 10.4 (H) 03/19/2018 0956   ALKPHOS 62 03/19/2018 0956   AST 17 03/19/2018 0956   ALT 10 03/19/2018 0956   BILITOT 0.9 03/19/2018 0956       RADIOGRAPHIC STUDIES: I have personally reviewed the radiological images as listed and agreed with the findings in the report. No results found.   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   # Continue surveillance; no obvious progression/recurrence noted/see discussion below  #Thrombocytopenia mild.  Intermittent.  138.  However recently in September platelets in [facility] 154.  # slightly elevted ca-10.6 normal is 10.4.  Doubt related to malignancy  related hypercalcemia.  Recheck ca-/cbc in 2 weeks.   #Double vision-improved after correcting the refractory errors-stable.  # s/p [facility] in sep 2019 immunizations.  # DISPOSITION:  # repeat CBC/bmp/ldh in 2 weeks [calcium] # follow up in 3 months/labs [cbc/cmp/ldh]- Dr.B  Cc; Dr.Tullo    Orders Placed This Encounter  Procedures  . CBC with Differential/Platelet    Standing Status:   Future    Standing Expiration Date:   03/20/2019  . Basic metabolic panel    Standing Status:   Future  Standing Expiration Date:   03/20/2019  . Lactate dehydrogenase    Standing Status:   Future    Standing Expiration Date:   03/20/2019   All questions were answered. The patient knows to call the clinic with any problems, questions or concerns.      Cammie Sickle, MD 03/19/2018 2:10 PM

## 2018-04-02 ENCOUNTER — Inpatient Hospital Stay: Payer: BLUE CROSS/BLUE SHIELD

## 2018-04-02 DIAGNOSIS — C8583 Other specified types of non-Hodgkin lymphoma, intra-abdominal lymph nodes: Secondary | ICD-10-CM

## 2018-04-02 DIAGNOSIS — C8338 Diffuse large B-cell lymphoma, lymph nodes of multiple sites: Secondary | ICD-10-CM | POA: Diagnosis not present

## 2018-04-02 LAB — CBC WITH DIFFERENTIAL/PLATELET
ABS IMMATURE GRANULOCYTES: 0.02 10*3/uL (ref 0.00–0.07)
Basophils Absolute: 0 10*3/uL (ref 0.0–0.1)
Basophils Relative: 1 %
EOS PCT: 2 %
Eosinophils Absolute: 0.1 10*3/uL (ref 0.0–0.5)
HEMATOCRIT: 35.2 % — AB (ref 36.0–46.0)
HEMOGLOBIN: 12 g/dL (ref 12.0–15.0)
Immature Granulocytes: 1 %
LYMPHS ABS: 0.8 10*3/uL (ref 0.7–4.0)
LYMPHS PCT: 18 %
MCH: 31.5 pg (ref 26.0–34.0)
MCHC: 34.1 g/dL (ref 30.0–36.0)
MCV: 92.4 fL (ref 80.0–100.0)
MONO ABS: 0.4 10*3/uL (ref 0.1–1.0)
Monocytes Relative: 10 %
NEUTROS ABS: 2.9 10*3/uL (ref 1.7–7.7)
Neutrophils Relative %: 68 %
PLATELETS: 111 10*3/uL — AB (ref 150–400)
RBC: 3.81 MIL/uL — AB (ref 3.87–5.11)
RDW: 11.9 % (ref 11.5–15.5)
WBC: 4.3 10*3/uL (ref 4.0–10.5)
nRBC: 0 % (ref 0.0–0.2)

## 2018-04-02 LAB — BASIC METABOLIC PANEL
Anion gap: 7 (ref 5–15)
BUN: 15 mg/dL (ref 8–23)
CHLORIDE: 101 mmol/L (ref 98–111)
CO2: 28 mmol/L (ref 22–32)
CREATININE: 1 mg/dL (ref 0.44–1.00)
Calcium: 9.5 mg/dL (ref 8.9–10.3)
GFR calc Af Amer: >60 mL/min (ref >60)
GFR calc non Af Amer: 59 mL/min — ABNORMAL LOW (ref >60)
GLUCOSE: 90 mg/dL (ref 70–99)
POTASSIUM: 4.5 mmol/L (ref 3.5–5.1)
Sodium: 136 mmol/L (ref 135–145)

## 2018-04-02 LAB — LACTATE DEHYDROGENASE: LDH: 126 U/L (ref 98–192)

## 2018-04-04 ENCOUNTER — Telehealth: Payer: Self-pay | Admitting: Internal Medicine

## 2018-04-04 ENCOUNTER — Encounter: Payer: Self-pay | Admitting: Internal Medicine

## 2018-04-04 NOTE — Telephone Encounter (Signed)
My chart message sent  Order CBC in 1 month.  Thank you

## 2018-04-21 ENCOUNTER — Encounter: Payer: Self-pay | Admitting: Internal Medicine

## 2018-04-22 ENCOUNTER — Telehealth: Payer: Self-pay | Admitting: Internal Medicine

## 2018-04-22 NOTE — Telephone Encounter (Signed)
Spoke to patient/husband regarding insurance issues.  I informed that I would be happy to help them out any way I can.  They will let me know of their preference.

## 2018-04-22 NOTE — Telephone Encounter (Signed)
Left a message for the patient's husband to call us back to discuss insurance issues.

## 2018-05-02 ENCOUNTER — Inpatient Hospital Stay: Payer: BLUE CROSS/BLUE SHIELD | Attending: Internal Medicine

## 2018-05-02 ENCOUNTER — Other Ambulatory Visit: Payer: Self-pay

## 2018-05-02 DIAGNOSIS — Z9484 Stem cells transplant status: Secondary | ICD-10-CM | POA: Insufficient documentation

## 2018-05-02 DIAGNOSIS — C8338 Diffuse large B-cell lymphoma, lymph nodes of multiple sites: Secondary | ICD-10-CM

## 2018-05-02 DIAGNOSIS — Z79899 Other long term (current) drug therapy: Secondary | ICD-10-CM | POA: Insufficient documentation

## 2018-05-02 DIAGNOSIS — D696 Thrombocytopenia, unspecified: Secondary | ICD-10-CM | POA: Insufficient documentation

## 2018-05-02 DIAGNOSIS — Z7901 Long term (current) use of anticoagulants: Secondary | ICD-10-CM | POA: Diagnosis not present

## 2018-05-02 DIAGNOSIS — Z86718 Personal history of other venous thrombosis and embolism: Secondary | ICD-10-CM | POA: Insufficient documentation

## 2018-05-02 DIAGNOSIS — H532 Diplopia: Secondary | ICD-10-CM | POA: Diagnosis not present

## 2018-05-02 LAB — CBC WITH DIFFERENTIAL/PLATELET
ABS IMMATURE GRANULOCYTES: 0.04 10*3/uL (ref 0.00–0.07)
Basophils Absolute: 0 10*3/uL (ref 0.0–0.1)
Basophils Relative: 1 %
Eosinophils Absolute: 0.1 10*3/uL (ref 0.0–0.5)
Eosinophils Relative: 2 %
HEMATOCRIT: 35.6 % — AB (ref 36.0–46.0)
HEMOGLOBIN: 12.1 g/dL (ref 12.0–15.0)
Immature Granulocytes: 1 %
LYMPHS ABS: 0.9 10*3/uL (ref 0.7–4.0)
LYMPHS PCT: 16 %
MCH: 30.9 pg (ref 26.0–34.0)
MCHC: 34 g/dL (ref 30.0–36.0)
MCV: 91 fL (ref 80.0–100.0)
Monocytes Absolute: 0.5 10*3/uL (ref 0.1–1.0)
Monocytes Relative: 10 %
NEUTROS ABS: 3.8 10*3/uL (ref 1.7–7.7)
Neutrophils Relative %: 70 %
Platelets: 127 10*3/uL — ABNORMAL LOW (ref 150–400)
RBC: 3.91 MIL/uL (ref 3.87–5.11)
RDW: 12.1 % (ref 11.5–15.5)
WBC: 5.3 10*3/uL (ref 4.0–10.5)
nRBC: 0 % (ref 0.0–0.2)

## 2018-05-05 ENCOUNTER — Telehealth: Payer: Self-pay | Admitting: Internal Medicine

## 2018-05-05 NOTE — Telephone Encounter (Signed)
Mychart message sent.

## 2018-06-12 ENCOUNTER — Other Ambulatory Visit: Payer: Self-pay

## 2018-06-12 DIAGNOSIS — C8333 Diffuse large B-cell lymphoma, intra-abdominal lymph nodes: Secondary | ICD-10-CM

## 2018-06-18 ENCOUNTER — Inpatient Hospital Stay: Payer: BLUE CROSS/BLUE SHIELD | Attending: Internal Medicine

## 2018-06-18 ENCOUNTER — Other Ambulatory Visit: Payer: Self-pay

## 2018-06-18 ENCOUNTER — Inpatient Hospital Stay (HOSPITAL_BASED_OUTPATIENT_CLINIC_OR_DEPARTMENT_OTHER): Payer: BLUE CROSS/BLUE SHIELD | Admitting: Internal Medicine

## 2018-06-18 ENCOUNTER — Encounter: Payer: Self-pay | Admitting: Internal Medicine

## 2018-06-18 VITALS — BP 127/74 | HR 69 | Temp 97.6°F | Resp 20 | Ht 62.0 in | Wt 160.0 lb

## 2018-06-18 DIAGNOSIS — Z7901 Long term (current) use of anticoagulants: Secondary | ICD-10-CM

## 2018-06-18 DIAGNOSIS — C8338 Diffuse large B-cell lymphoma, lymph nodes of multiple sites: Secondary | ICD-10-CM | POA: Diagnosis present

## 2018-06-18 DIAGNOSIS — Z86718 Personal history of other venous thrombosis and embolism: Secondary | ICD-10-CM

## 2018-06-18 DIAGNOSIS — Z79899 Other long term (current) drug therapy: Secondary | ICD-10-CM

## 2018-06-18 DIAGNOSIS — Z9484 Stem cells transplant status: Secondary | ICD-10-CM

## 2018-06-18 DIAGNOSIS — D696 Thrombocytopenia, unspecified: Secondary | ICD-10-CM

## 2018-06-18 DIAGNOSIS — H532 Diplopia: Secondary | ICD-10-CM | POA: Diagnosis not present

## 2018-06-18 DIAGNOSIS — C8333 Diffuse large B-cell lymphoma, intra-abdominal lymph nodes: Secondary | ICD-10-CM

## 2018-06-18 DIAGNOSIS — C8583 Other specified types of non-Hodgkin lymphoma, intra-abdominal lymph nodes: Secondary | ICD-10-CM

## 2018-06-18 LAB — COMPREHENSIVE METABOLIC PANEL
ALT: 12 U/L (ref 0–44)
AST: 19 U/L (ref 15–41)
Albumin: 4.4 g/dL (ref 3.5–5.0)
Alkaline Phosphatase: 64 U/L (ref 38–126)
Anion gap: 5 (ref 5–15)
BILIRUBIN TOTAL: 1 mg/dL (ref 0.3–1.2)
BUN: 9 mg/dL (ref 8–23)
CO2: 29 mmol/L (ref 22–32)
CREATININE: 1 mg/dL (ref 0.44–1.00)
Calcium: 9.2 mg/dL (ref 8.9–10.3)
Chloride: 104 mmol/L (ref 98–111)
GFR calc Af Amer: >60 mL/min (ref >60)
GFR calc non Af Amer: 59 mL/min — ABNORMAL LOW (ref >60)
Glucose, Bld: 98 mg/dL (ref 70–99)
Potassium: 4.9 mmol/L (ref 3.5–5.1)
Sodium: 138 mmol/L (ref 135–145)
Total Protein: 6.2 g/dL — ABNORMAL LOW (ref 6.5–8.1)

## 2018-06-18 LAB — CBC WITH DIFFERENTIAL/PLATELET
Abs Immature Granulocytes: 0.02 10*3/uL (ref 0.00–0.07)
Basophils Absolute: 0 10*3/uL (ref 0.0–0.1)
Basophils Relative: 1 %
EOS ABS: 0.1 10*3/uL (ref 0.0–0.5)
EOS PCT: 2 %
HCT: 34 % — ABNORMAL LOW (ref 36.0–46.0)
Hemoglobin: 11.6 g/dL — ABNORMAL LOW (ref 12.0–15.0)
Immature Granulocytes: 1 %
Lymphocytes Relative: 10 %
Lymphs Abs: 0.4 10*3/uL — ABNORMAL LOW (ref 0.7–4.0)
MCH: 31.9 pg (ref 26.0–34.0)
MCHC: 34.1 g/dL (ref 30.0–36.0)
MCV: 93.4 fL (ref 80.0–100.0)
MONO ABS: 0.6 10*3/uL (ref 0.1–1.0)
Monocytes Relative: 15 %
Neutro Abs: 2.8 10*3/uL (ref 1.7–7.7)
Neutrophils Relative %: 71 %
Platelets: 103 10*3/uL — ABNORMAL LOW (ref 150–400)
RBC: 3.64 MIL/uL — ABNORMAL LOW (ref 3.87–5.11)
RDW: 13 % (ref 11.5–15.5)
WBC: 3.9 10*3/uL — ABNORMAL LOW (ref 4.0–10.5)
nRBC: 0 % (ref 0.0–0.2)

## 2018-06-18 LAB — LACTATE DEHYDROGENASE: LDH: 144 U/L (ref 98–192)

## 2018-06-18 NOTE — Assessment & Plan Note (Addendum)
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  #Clinically no evidence of recurrence.  Continue surveillance.  LDH is normal.  Awaiting appointment at [facility] for immunization on March 4  #Thrombocytopenia mild intermittent-likely secondary to stem cell transplant.  Today platelets of 103- white count 3.9 normal ANC; hemoglobin 11.6; monitor closely.  #Mild hypercalcemia-currently resolved calcium normal.  #Double vision-improved after correcting the refractory errors-stable  # DISPOSITION:  # follow up in 3 months-MD/labs [cbc/cmp/ldh]- Dr.B

## 2018-06-18 NOTE — Progress Notes (Signed)
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 CT- 1-2 CM LN subpectoral/medistinal/ retro-peritoneal/pelvic/ Right inguinal LN 1.6cm- Bx- DLBCL- ABC; myc-POS; FISH gene re-arragement-NEG. PET- MULTIPLE LN/ Bone involvement; R-CHOP x6- CR'# OCT 2017- PET scan- CR; DEC 22nd 2017- BMBx- "atypical large B cells- <1%  [[facility]; II opinion- Dr.Grover- review of path- not concerning for residual lymphoma]; recommend surviellance  # March 26th  2018PET- NED  # Lumbar puncture- difficult-spinal headache. S/p high dose MXT x4.  ------------------------------------------------------------------- # NOV 2018-recurrent diffuse large B-cell lymphoma [right axilla lymph node biopsy proven]; PET- multiple bone lesions; axillary adenopathy; splenomegaly uptake  # Nov 16th 2018- R-; s/p 3 cycles-CR' Auto-Stem cell Transplant date:07/11/17 [facility]; Dr.Vincent]; May 1st 2019- PET CR.  ---------------------------------------------------------- # June 2017-Elevated LFTs- valacylovir/diflucan; resolved.   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  Patient appetite is good patient is gaining weight.  No nausea no vomiting.  No unusual continue legs.  No headaches.  She continues to wear prism on her right eye or double vision.   Review of Systems  Constitutional: Negative for chills, diaphoresis, fever, malaise/fatigue and weight loss.  HENT: Negative for nosebleeds and sore throat.   Eyes: Negative for double vision.  Respiratory: Negative for cough, hemoptysis, sputum production, shortness of breath and wheezing.   Cardiovascular: Negative for chest pain, orthopnea and leg swelling.  Gastrointestinal: Negative for abdominal pain, blood in stool, constipation, diarrhea, heartburn, melena, nausea and vomiting.  Genitourinary: Negative for dysuria, frequency and urgency.  Musculoskeletal: Negative for back pain and joint pain.  Skin: Negative.  Negative for itching and rash.  Neurological: Negative for dizziness, tingling, focal weakness, weakness and headaches.  Endo/Heme/Allergies: Does not bruise/bleed easily.  Psychiatric/Behavioral: Negative for depression. The patient does not have insomnia.       PAST MEDICAL HISTORY :  Past Medical History:  Diagnosis Date  . Arthritis   . Blood dyscrasia   . Cancer ([place])   . Chest pain, unspecified   . Diverticulosis 07/06/15  . Dysrhythmia    tachycardia  . Esophagitis 07/06/15  . Fatigue   . Gastritis 07/06/15  . GERD (gastroesophageal reflux disease)   . 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 Leg cramps   . Lymphoma ([place]) 09/14/15  Monoclonal B cell lymphoma  . Night sweats   . Osteoporosis   . Overweight(278.02)    Obesity  . Palpitations   . Shortness of breath dyspnea   . Tachycardia   . Thrombocytopenia ([place])     PAST SURGICAL HISTORY :   Past Surgical History:  Procedure Laterality Date  . ABDOMINAL HYSTERECTOMY  2005  . BONE MARROW BIOPSY  09/14/15  . CHOLECYSTECTOMY  1997  . COLONOSCOPY  07/05/2014  . ESOPHAGOGASTRODUODENOSCOPY  07/05/2014  . INGUINAL LYMPH NODE BIOPSY Right 10/05/2015   Procedure: INGUINAL LYMPH NODE BIOPSY;  Surgeon: Christene Lye, MD;  Location: [facility] ORS;  Service: General;  Laterality: Right;  . PERIPHERAL VASCULAR CATHETERIZATION N/A 09/27/2015   Procedure: Glori Luis Cath Insertion;  Surgeon: Algernon Huxley, MD;  Location: [facility];  Service: Cardiovascular;  Laterality: N/A;  . PORTA CATH REMOVAL Right 06/03/2017   Procedure: PORTA CATH REMOVAL, with venous thrombectomy;  Surgeon: Algernon Huxley, MD;  Location: [facility];  Service: Cardiovascular;  Laterality: Right;  . PORTACATH PLACEMENT Right     FAMILY HISTORY :   Family History  Problem Relation Age of Onset  . Heart disease Father        CABG x 4  . Multiple myeloma Father   . Hypertension Mother   . Pancreatic cancer Mother   . Ulcers Mother   . Asthma Mother   . Brain cancer Maternal Uncle   . Multiple myeloma Maternal Uncle   . Diabetes Neg Hx   . Breast cancer Neg Hx     SOCIAL HISTORY:   Social History   Tobacco Use  . Smoking status: Never Smoker  . Smokeless tobacco: Never Used  Substance Use Topics  . Alcohol use: No    Alcohol/week: 0.0 standard drinks  . Drug use: No    ALLERGIES:  is allergic to ondansetron hcl; oxycodone; and rituximab.  MEDICATIONS:  Current Outpatient Medications  Medication Sig Dispense Refill  . acetaminophen (TYLENOL 8 HOUR) 650 MG CR tablet Take 650 mg by mouth every 8 (eight) hours as needed for pain.    Marland Kitchen acyclovir (ZOVIRAX) 400 MG tablet TAKE 1  TABLET BY [facility] TWICE DAILY. TAKE 1 TABLET A DAY (TO PREVENT SHINGLES) 60 tablet 3  . escitalopram (LEXAPRO) 10 MG tablet TAKE ONE TABLET EVERY DAY 30 tablet 2   No current facility-administered medications for this visit.    Facility-Administered Medications Ordered in Other Visits  Medication Dose Route Frequency Provider Last Rate Last Dose  . 0.9 %  sodium chloride infusion   Intravenous Continuous Evlyn Kanner, NP   Stopped at 10/14/15 1312  . methotrexate (50 mg/ml) 6.3 g in sodium chloride 0.9 % 1,000 mL injection   Intravenous Once Charlaine Dalton R, MD      . sodium chloride flush (NS) 0.9 % injection 10 mL  10 mL Intravenous PRN Cammie Sickle, MD   10 mL at 10/05/16 1400    PHYSICAL EXAMINATION: ECOG PERFORMANCE STATUS: 0 - Asymptomatic  BP 127/74   Pulse 69   Temp 97.6 F (36.4 C) (Tympanic)   Resp 20   Ht 5' 2"  (1.575 m)   Wt 160 lb (72.6 kg)   BMI 29.26 kg/m   Filed Weights   06/18/18 1023  Weight: 160 lb (72.6 kg)   Physical Exam  Constitutional: She is oriented to person, place, and time and well-developed, well-nourished, and in no distress.  Accompanied by husband.  HENT:  Head: Normocephalic and atraumatic.  Mouth/Throat: Oropharynx is clear and moist. No oropharyngeal exudate.  Eyes: Pupils are equal, round, and reactive to light.  Neck: Normal range of motion. Neck supple.  Cardiovascular: Normal rate and regular rhythm.  Pulmonary/Chest: No respiratory distress. She has no wheezes.  Abdominal: Soft. Bowel sounds are normal. She exhibits no distension and no mass. There is no abdominal tenderness. There is no rebound and no guarding.  Musculoskeletal: Normal range of motion.        General: No tenderness or edema.  Neurological: She is alert and oriented to person, place, and time.  Skin: Skin is warm.  Psychiatric: Affect normal.       LABORATORY DATA:  I have reviewed the data as listed    Component Value Date/Time   NA 138  06/18/2018 1004   NA 139 06/29/2010 0749   K 4.9 06/18/2018 1004   CL 104 06/18/2018 1004   CO2 29 06/18/2018 1004   GLUCOSE 98 06/18/2018 1004   BUN 9 06/18/2018 1004   BUN 16 06/29/2010 0749   CREATININE 1.00 06/18/2018 1004   CALCIUM 9.2 06/18/2018 1004   PROT 6.2 (L) 06/18/2018 1004   ALBUMIN 4.4 06/18/2018 1004   AST 19 06/18/2018 1004   ALT 12 06/18/2018 1004   ALKPHOS 64 06/18/2018 1004   BILITOT 1.0 06/18/2018 1004   GFRNONAA 59 (L) 06/18/2018 1004   GFRAA >60 06/18/2018 1004    No results found for: SPEP, UPEP  Lab Results  Component Value Date   WBC 3.9 (L) 06/18/2018   NEUTROABS 2.8 06/18/2018   HGB 11.6 (L) 06/18/2018   HCT 34.0 (L) 06/18/2018   MCV 93.4 06/18/2018   PLT 103 (L) 06/18/2018      Chemistry      Component Value Date/Time   NA 138 06/18/2018 1004   NA 139 06/29/2010 0749   K 4.9 06/18/2018 1004   CL 104 06/18/2018 1004   CO2 29 06/18/2018 1004   BUN 9 06/18/2018 1004   BUN 16 06/29/2010 0749   CREATININE 1.00 06/18/2018 1004   GLU 93 06/29/2010 0749      Component Value Date/Time   CALCIUM 9.2 06/18/2018 1004   ALKPHOS 64 06/18/2018 1004   AST 19 06/18/2018 1004   ALT 12 06/18/2018 1004   BILITOT 1.0 06/18/2018 1004       RADIOGRAPHIC STUDIES: I have personally reviewed the radiological images as listed and agreed with the findings in the report. No results found.   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  #Clinically no evidence of recurrence.  Continue surveillance.  LDH is normal.  Awaiting appointment at [facility] for immunization on March 4  #Thrombocytopenia mild intermittent-likely secondary to stem cell transplant.  Today platelets of 103- white count 3.9 normal ANC; hemoglobin 11.6; monitor closely.  #Mild hypercalcemia-currently resolved calcium  normal.  #Double vision-improved after correcting the refractory errors-stable  # DISPOSITION:  # follow up in 3 months-MD/labs [cbc/cmp/ldh]- Dr.B    No orders of the defined types were placed in this encounter.  All questions were answered. The patient knows to call the clinic with any problems, questions or concerns.      Cammie Sickle, MD 06/18/2018 12:20 PM

## 2018-06-23 ENCOUNTER — Other Ambulatory Visit: Payer: Self-pay | Admitting: Internal Medicine

## 2018-08-05 IMAGING — CT NM PET TUM IMG RESTAG (PS) SKULL BASE T - THIGH
10 series · 25 of 25 positions shown · non-contrast
Comparison: 11/11/2015

CLINICAL DATA: Subsequent treatment strategy for large cell
lymphoma of intra abdominal lymph nodes. Status post right inguinal
node biopsy 10/05/2015. Chemotherapy 3 weeks ago..

EXAM:
NUCLEAR MEDICINE PET SKULL BASE TO THIGH
TECHNIQUE: 13.2 mCi F-18 FDG was injected intravenously. Full-ring PET imaging
was performed from the skull base to thigh after the radiotracer. CT
data was obtained and used for attenuation correction and anatomic
localization.
FASTING BLOOD GLUCOSE:  Value: 91 mg/dl

[Series 3: ct wb 5.0 b30f · axial · 5.0mm · 0.98mm/px · z∈[-1396,-528]mm · 3 of 290 slices shown]
[im 1/290]
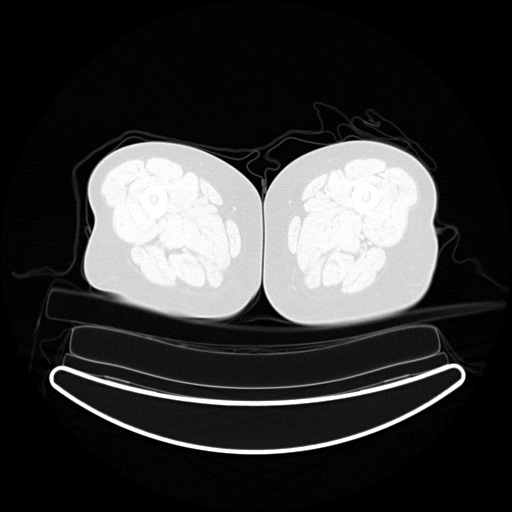
[im 145/290]
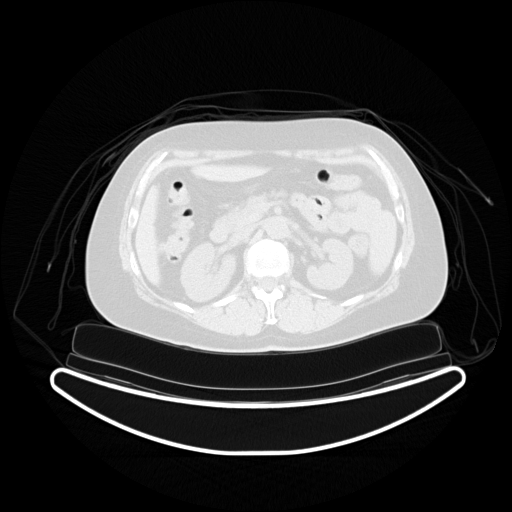
[im 290/290  brain]
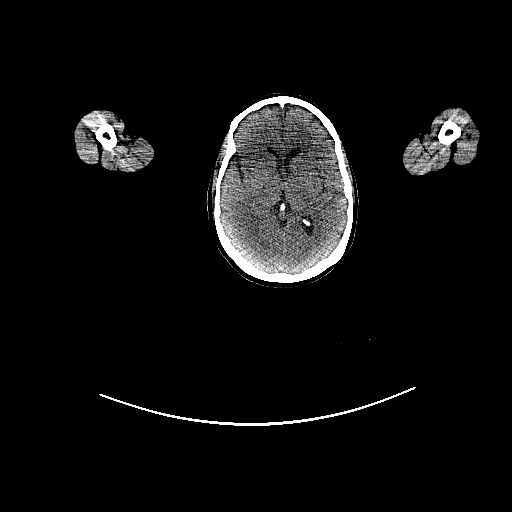

[Series 4: pet wb (ac) · axial · 5.0mm · 4.07mm/px · z∈[-1396,-528]mm · 3 of 290 slices shown]
[im 1/290]
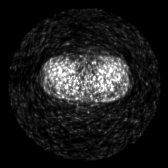
[im 145/290]
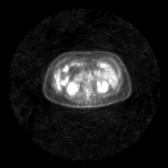
[im 290/290]
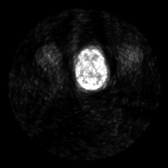

[Series 5: pet wb uncorrected (nac) · axial · 5.0mm · 4.07mm/px · z∈[-1396,-528]mm · 4 of 290 slices shown]
[im 1/290]
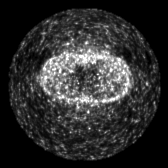
[im 97/290]
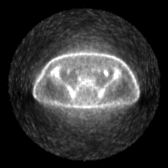
[im 193/290]
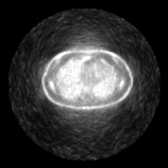
[im 290/290]
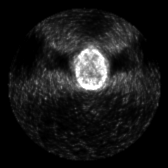

[Series 603: pet/ct axial · 4 of 289 slices shown]
[im 1/289]
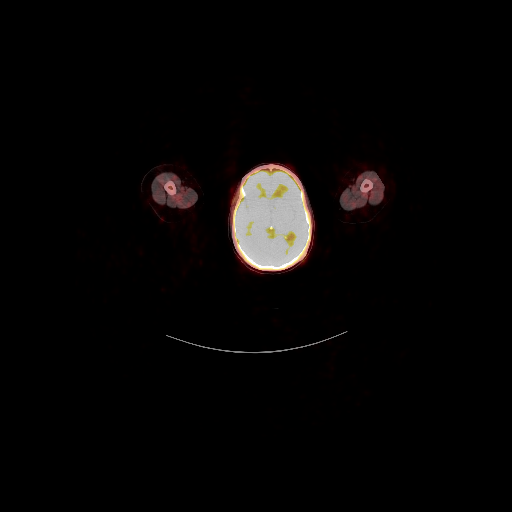
[im 97/289]
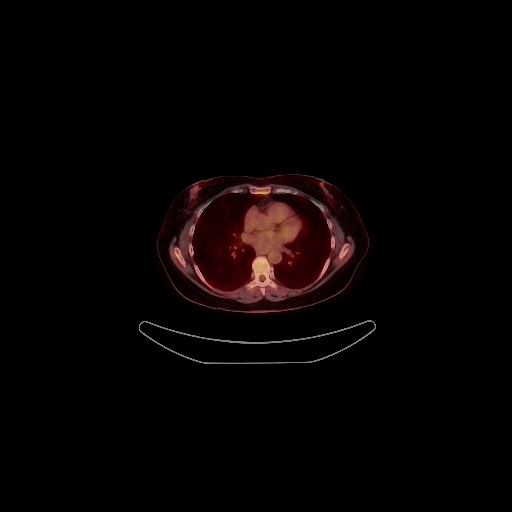
[im 193/289]
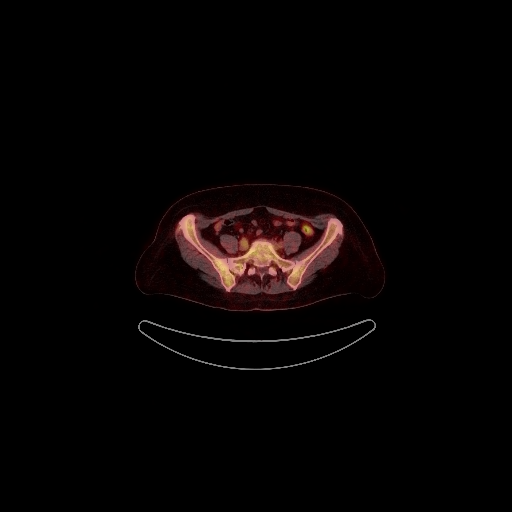
[im 289/289]
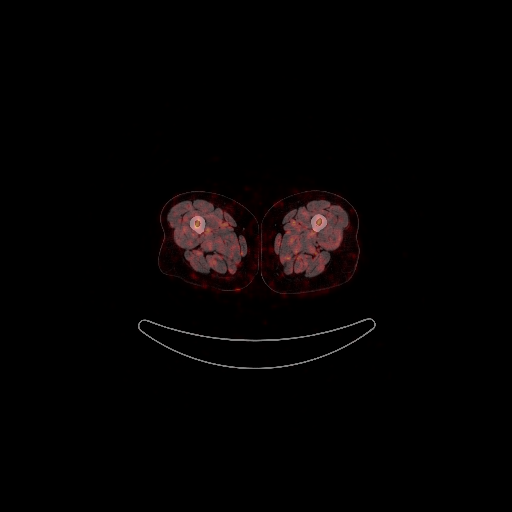

[Series 604: pet/ct coronal · 1 of 73 slices shown]
[im 1/73]
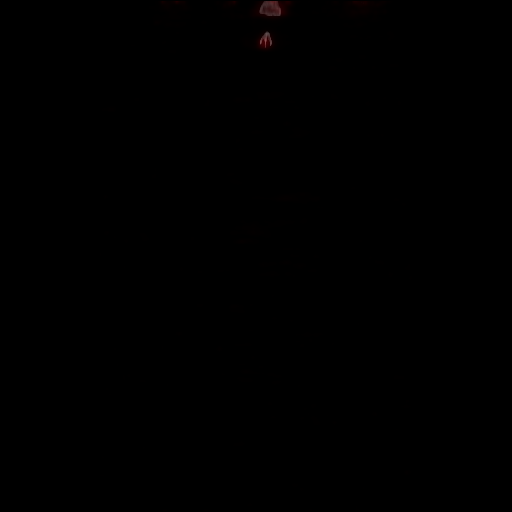

[Series 605: pet/ct sagittal · 2 of 131 slices shown]
[im 1/131]
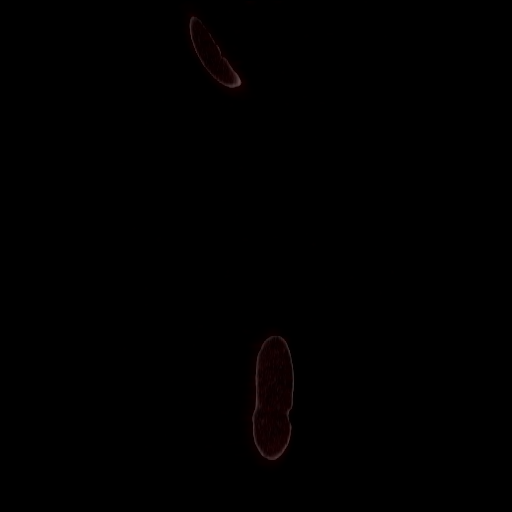
[im 131/131]
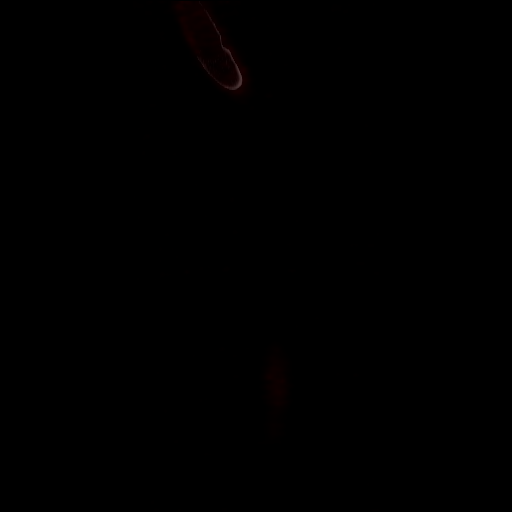

[Series 606: pet axial · 4 of 288 slices shown]
[im 1/288]
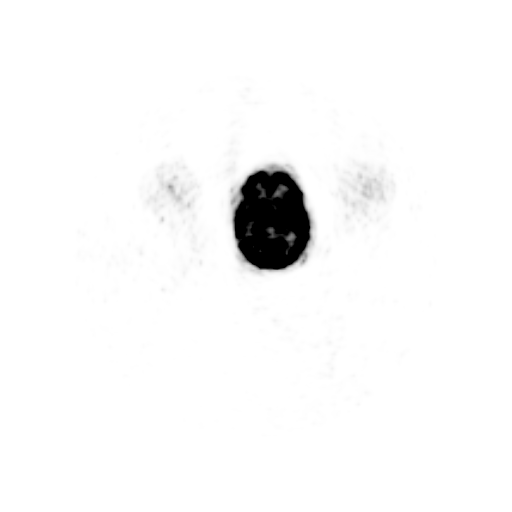
[im 96/288]
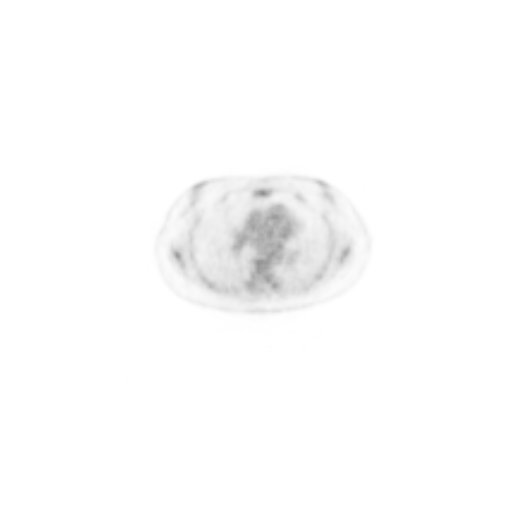
[im 192/288]
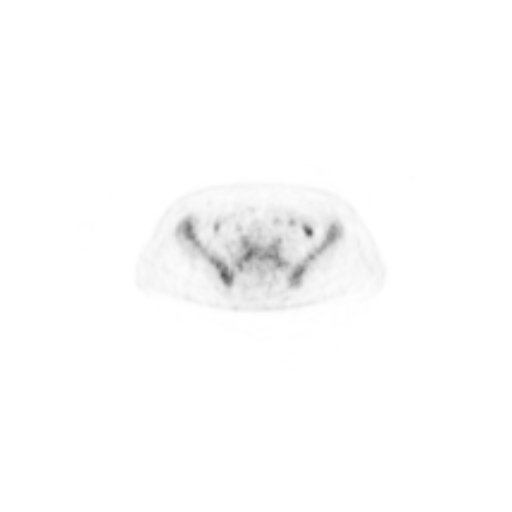
[im 288/288]
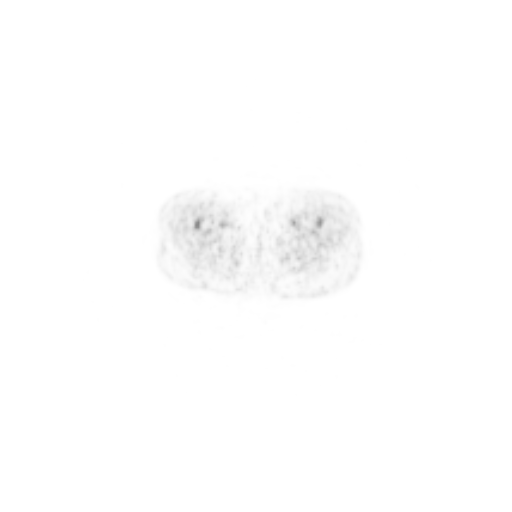

[Series 607: pet coronal · 1 of 86 slices shown]
[im 1/86]
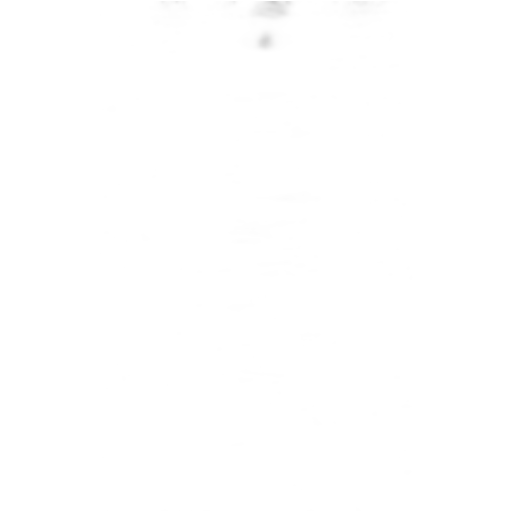

[Series 608: pet sagittal · 2 of 152 slices shown]
[im 1/152]
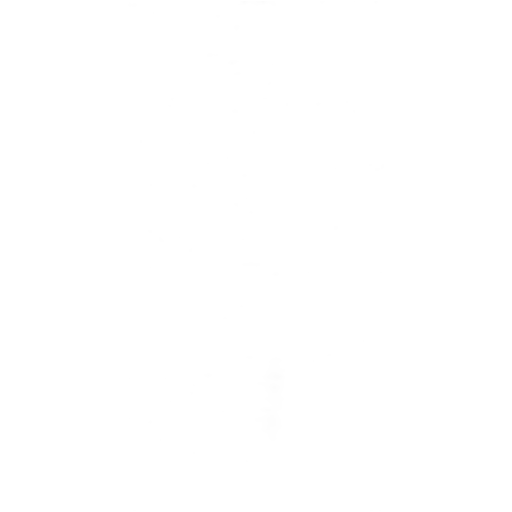
[im 152/152]
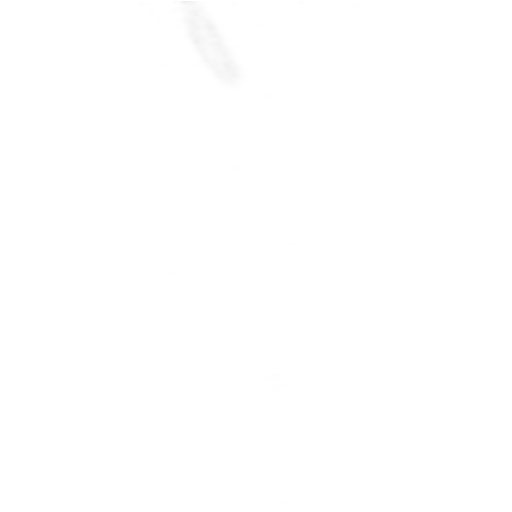

[results mm oncology reading · 0.93mm/px · 1 of 2 slices shown]
[im 1/2]
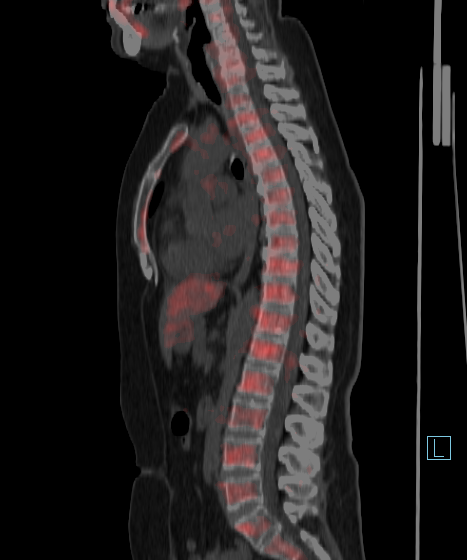

[25 of 25 positions shown; findings below may reference images not displayed]

FINDINGS: NECK

No areas of abnormal hypermetabolism.

CHEST

No thoracic nodal or pulmonary parenchymal hypermetabolism.

ABDOMEN/PELVIS

No abdominal pelvic nodal hypermetabolism. No splenic
hypermetabolism.

SKELETON

Redemonstration of diffuse low-level marrow hypermetabolism.

CT IMAGES PERFORMED FOR ATTENUATION CORRECTION

No cervical adenopathy. A right-sided Port-A-Cath which terminates
at the low SVC. Heart size upper normal. Tiny hiatal hernia.
Cholecystectomy. Abdominal aortic atherosclerosis. No adenopathy
within the abdomen or pelvis. Hysterectomy.
IMPRESSION: 1. No findings to suggest residual or recurrent lymphoma.
2. Low-level marrow hypermetabolism, similar. This is likely related
to stimulation by chemotherapy.
3. Aortic atherosclerosis.

## 2018-08-25 ENCOUNTER — Other Ambulatory Visit: Payer: Self-pay | Admitting: Internal Medicine

## 2018-09-12 ENCOUNTER — Telehealth: Payer: Self-pay | Admitting: *Deleted

## 2018-09-12 NOTE — Telephone Encounter (Signed)
Contacted patient regarding the need for a telehealth visit next Tuesday 5/5. She was instructed to keep her lab apt as scheduled. She is agreeable to doximetry virtual visit.

## 2018-09-16 ENCOUNTER — Inpatient Hospital Stay (HOSPITAL_BASED_OUTPATIENT_CLINIC_OR_DEPARTMENT_OTHER): Payer: BLUE CROSS/BLUE SHIELD | Admitting: Internal Medicine

## 2018-09-16 ENCOUNTER — Inpatient Hospital Stay: Payer: BLUE CROSS/BLUE SHIELD | Attending: Internal Medicine

## 2018-09-16 ENCOUNTER — Encounter: Payer: Self-pay | Admitting: Internal Medicine

## 2018-09-16 ENCOUNTER — Other Ambulatory Visit: Payer: Self-pay | Admitting: *Deleted

## 2018-09-16 ENCOUNTER — Other Ambulatory Visit: Payer: Self-pay

## 2018-09-16 DIAGNOSIS — H532 Diplopia: Secondary | ICD-10-CM | POA: Diagnosis not present

## 2018-09-16 DIAGNOSIS — Z7901 Long term (current) use of anticoagulants: Secondary | ICD-10-CM | POA: Diagnosis not present

## 2018-09-16 DIAGNOSIS — C8583 Other specified types of non-Hodgkin lymphoma, intra-abdominal lymph nodes: Secondary | ICD-10-CM

## 2018-09-16 DIAGNOSIS — Z79899 Other long term (current) drug therapy: Secondary | ICD-10-CM | POA: Diagnosis not present

## 2018-09-16 DIAGNOSIS — Z9484 Stem cells transplant status: Secondary | ICD-10-CM | POA: Diagnosis not present

## 2018-09-16 DIAGNOSIS — C8338 Diffuse large B-cell lymphoma, lymph nodes of multiple sites: Secondary | ICD-10-CM | POA: Insufficient documentation

## 2018-09-16 DIAGNOSIS — D696 Thrombocytopenia, unspecified: Secondary | ICD-10-CM | POA: Insufficient documentation

## 2018-09-16 DIAGNOSIS — Z86718 Personal history of other venous thrombosis and embolism: Secondary | ICD-10-CM | POA: Insufficient documentation

## 2018-09-16 LAB — CBC WITH DIFFERENTIAL/PLATELET
Abs Immature Granulocytes: 0.02 10*3/uL (ref 0.00–0.07)
Basophils Absolute: 0 10*3/uL (ref 0.0–0.1)
Basophils Relative: 1 %
Eosinophils Absolute: 0.1 10*3/uL (ref 0.0–0.5)
Eosinophils Relative: 3 %
HCT: 36.6 % (ref 36.0–46.0)
Hemoglobin: 12.4 g/dL (ref 12.0–15.0)
Immature Granulocytes: 0 %
Lymphocytes Relative: 21 %
Lymphs Abs: 0.9 10*3/uL (ref 0.7–4.0)
MCH: 32 pg (ref 26.0–34.0)
MCHC: 33.9 g/dL (ref 30.0–36.0)
MCV: 94.3 fL (ref 80.0–100.0)
Monocytes Absolute: 0.5 10*3/uL (ref 0.1–1.0)
Monocytes Relative: 11 %
Neutro Abs: 2.9 10*3/uL (ref 1.7–7.7)
Neutrophils Relative %: 64 %
Platelets: 131 10*3/uL — ABNORMAL LOW (ref 150–400)
RBC: 3.88 MIL/uL (ref 3.87–5.11)
RDW: 12.4 % (ref 11.5–15.5)
WBC: 4.5 10*3/uL (ref 4.0–10.5)
nRBC: 0 % (ref 0.0–0.2)

## 2018-09-16 LAB — COMPREHENSIVE METABOLIC PANEL WITH GFR
ALT: 9 U/L (ref 0–44)
AST: 16 U/L (ref 15–41)
Albumin: 4.2 g/dL (ref 3.5–5.0)
Alkaline Phosphatase: 69 U/L (ref 38–126)
Anion gap: 8 (ref 5–15)
BUN: 15 mg/dL (ref 8–23)
CO2: 28 mmol/L (ref 22–32)
Calcium: 9.1 mg/dL (ref 8.9–10.3)
Chloride: 105 mmol/L (ref 98–111)
Creatinine, Ser: 1.08 mg/dL — ABNORMAL HIGH (ref 0.44–1.00)
GFR calc Af Amer: >60 mL/min
GFR calc non Af Amer: 54 mL/min — ABNORMAL LOW
Glucose, Bld: 92 mg/dL (ref 70–99)
Potassium: 4.8 mmol/L (ref 3.5–5.1)
Sodium: 141 mmol/L (ref 135–145)
Total Bilirubin: 0.8 mg/dL (ref 0.3–1.2)
Total Protein: 5.8 g/dL — ABNORMAL LOW (ref 6.5–8.1)

## 2018-09-16 LAB — LACTATE DEHYDROGENASE: LDH: 136 U/L (ref 98–192)

## 2018-09-16 NOTE — Assessment & Plan Note (Signed)
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  #No clinical evidence of recurrence.  Stable; platelets 127 white count normal at 4 hemoglobin normal at 12.4.  #Double vision-improved after correcting the refractory errors-improved/resolved.  #Infectious prophylaxis-stem cell transplant more than 1 year-I think it is reasonable to come off shingles prophylaxis.   # DISPOSITION:  # follow up in 3 months-MD clinic/labs [cbc/cmp/ldh]- Dr.B

## 2018-09-16 NOTE — Progress Notes (Signed)
I connected with Saniyah B San [place]ino on 09/16/18 at 11:30 AM EDT by video enabled telemedicine visit and verified that I am speaking with the correct person using two identifiers.  I discussed the limitations, risks, security and privacy concerns of performing an evaluation and management service by telemedicine and the availability of in-person appointments. I also discussed with the patient that there may be a patient responsible charge related to this service. The patient expressed understanding and agreed to proceed.    Other persons participating in the visit and their role in the encounter: husband  Patient's location: home  Provider's location: home   Oncology History   #MAY 2017- [place] with intravascular features STAGE IV- [BMBx- hypercellular- lymphoproliferative process is mostly seen within small vessels in the bone marrow as well as the surrounding interstitium associated with circulating lymphoma cells in the peripheral blood; ]. CT- 1-2 CM LN subpectoral/medistinal/ retro-peritoneal/pelvic/ Right inguinal LN 1.6cm- Bx- DLBCL- ABC; myc-POS; FISH gene re-arragement-NEG. PET- MULTIPLE LN/ Bone involvement; R-CHOP x6- CR'# OCT 2017- PET scan- CR; DEC 22nd 2017- BMBx- "atypical large B cells- <1%  [[facility]; II opinion- Dr.Grover- review of path- not concerning for residual lymphoma]; recommend surviellance  # March 26th  2018PET- NED  # Lumbar puncture- difficult-spinal headache. S/p high dose MXT x4.  ------------------------------------------------------------------- # NOV 2018-recurrent diffuse large B-cell lymphoma [right axilla lymph node biopsy proven]; PET- multiple bone lesions; axillary adenopathy; splenomegaly uptake  # Nov 16th 2018- R-; s/p 3 cycles-CR' Auto-Stem cell Transplant date:07/11/17 [facility]; Dr.Vincent]; May 1st 2019- PET CR.  ---------------------------------------------------------- # June 2017-Elevated LFTs- valacylovir/diflucan; resolved.   #LEFT LE CALF  DVT- on eliquis; STOP NOV 2017.   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  female with history of diffuse large B-cell lymphoma status post stem cell transplant February 2019.  Patient denies any lumps or bumps.  Appetite is good but no weight loss.  No night sweats.  No chest pain or shortness with the cough.  Observation/objective: Hemoglobin 12.4.  Platelets 127.  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  #No clinical evidence of recurrence.  Stable; platelets 127 white count normal at 4 hemoglobin normal at 12.4.  #Double vision-improved after correcting the refractory errors-improved/resolved.  #Infectious prophylaxis-stem cell transplant more than 1 year-I think it is reasonable to come off shingles prophylaxis.   # DISPOSITION:  # follow up in 3 months-MD clinic/labs [cbc/cmp/ldh]- Dr.B     Follow-up instructions:  I discussed the assessment and treatment plan with the patient.  The patient was provided an opportunity to ask questions and all were answered.  The patient agreed with the plan and demonstrated understanding of instructions.  The patient was advised to call back or seek an in person evaluation if the symptoms worsen or if the condition fails to improve as anticipated.   Dr. Charlaine Dalton [facility] 09/16/2018 11:45 AM

## 2018-12-16 ENCOUNTER — Other Ambulatory Visit: Payer: Self-pay

## 2018-12-17 ENCOUNTER — Other Ambulatory Visit: Payer: Self-pay

## 2018-12-17 ENCOUNTER — Inpatient Hospital Stay: Payer: BLUE CROSS/BLUE SHIELD | Attending: Internal Medicine

## 2018-12-17 ENCOUNTER — Inpatient Hospital Stay (HOSPITAL_BASED_OUTPATIENT_CLINIC_OR_DEPARTMENT_OTHER): Payer: BLUE CROSS/BLUE SHIELD | Admitting: Internal Medicine

## 2018-12-17 DIAGNOSIS — C8338 Diffuse large B-cell lymphoma, lymph nodes of multiple sites: Secondary | ICD-10-CM | POA: Diagnosis not present

## 2018-12-17 DIAGNOSIS — Z86718 Personal history of other venous thrombosis and embolism: Secondary | ICD-10-CM | POA: Diagnosis not present

## 2018-12-17 DIAGNOSIS — Z79899 Other long term (current) drug therapy: Secondary | ICD-10-CM | POA: Insufficient documentation

## 2018-12-17 DIAGNOSIS — C8583 Other specified types of non-Hodgkin lymphoma, intra-abdominal lymph nodes: Secondary | ICD-10-CM | POA: Diagnosis not present

## 2018-12-17 DIAGNOSIS — D696 Thrombocytopenia, unspecified: Secondary | ICD-10-CM | POA: Diagnosis not present

## 2018-12-17 LAB — CBC WITH DIFFERENTIAL/PLATELET
Abs Immature Granulocytes: 0.02 10*3/uL (ref 0.00–0.07)
Basophils Absolute: 0 10*3/uL (ref 0.0–0.1)
Basophils Relative: 0 %
Eosinophils Absolute: 0.1 10*3/uL (ref 0.0–0.5)
Eosinophils Relative: 2 %
HCT: 37.2 % (ref 36.0–46.0)
Hemoglobin: 12.7 g/dL (ref 12.0–15.0)
Immature Granulocytes: 0 %
Lymphocytes Relative: 23 %
Lymphs Abs: 1 10*3/uL (ref 0.7–4.0)
MCH: 31.1 pg (ref 26.0–34.0)
MCHC: 34.1 g/dL (ref 30.0–36.0)
MCV: 91.2 fL (ref 80.0–100.0)
Monocytes Absolute: 0.4 10*3/uL (ref 0.1–1.0)
Monocytes Relative: 9 %
Neutro Abs: 3 10*3/uL (ref 1.7–7.7)
Neutrophils Relative %: 66 %
Platelets: 131 10*3/uL — ABNORMAL LOW (ref 150–400)
RBC: 4.08 MIL/uL (ref 3.87–5.11)
RDW: 12.2 % (ref 11.5–15.5)
WBC: 4.6 10*3/uL (ref 4.0–10.5)
nRBC: 0 % (ref 0.0–0.2)

## 2018-12-17 LAB — COMPREHENSIVE METABOLIC PANEL
ALT: 9 U/L (ref 0–44)
AST: 15 U/L (ref 15–41)
Albumin: 4.6 g/dL (ref 3.5–5.0)
Alkaline Phosphatase: 70 U/L (ref 38–126)
Anion gap: 8 (ref 5–15)
BUN: 15 mg/dL (ref 8–23)
CO2: 28 mmol/L (ref 22–32)
Calcium: 9.4 mg/dL (ref 8.9–10.3)
Chloride: 104 mmol/L (ref 98–111)
Creatinine, Ser: 1.04 mg/dL — ABNORMAL HIGH (ref 0.44–1.00)
GFR calc Af Amer: >60 mL/min (ref >60)
GFR calc non Af Amer: 57 mL/min — ABNORMAL LOW (ref >60)
Glucose, Bld: 102 mg/dL — ABNORMAL HIGH (ref 70–99)
Potassium: 4.3 mmol/L (ref 3.5–5.1)
Sodium: 140 mmol/L (ref 135–145)
Total Bilirubin: 0.8 mg/dL (ref 0.3–1.2)
Total Protein: 6.6 g/dL (ref 6.5–8.1)

## 2018-12-17 LAB — LACTATE DEHYDROGENASE: LDH: 127 U/L (ref 98–192)

## 2018-12-17 NOTE — Progress Notes (Signed)
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 CT- 1-2 CM LN subpectoral/medistinal/ retro-peritoneal/pelvic/ Right inguinal LN 1.6cm- Bx- DLBCL- ABC; myc-POS; FISH gene re-arragement-NEG. PET- MULTIPLE LN/ Bone involvement; R-CHOP x6- CR'# OCT 2017- PET scan- CR; DEC 22nd 2017- BMBx- "atypical large B cells- <1%  [[facility]; II opinion- Dr.Grover- review of path- not concerning for residual lymphoma]; recommend surviellance  # March 26th  2018PET- NED  # Lumbar puncture- difficult-spinal headache. S/p high dose MXT x4.  ------------------------------------------------------------------- # NOV 2018-recurrent diffuse large B-cell lymphoma [right axilla lymph node biopsy proven]; PET- multiple bone lesions; axillary adenopathy; splenomegaly uptake  # Nov 16th 2018- R-; s/p 3 cycles-CR' Auto-Stem cell Transplant date: 07/11/17 [facility]; Dr.Vincent]; May 1st 2019- PET CR.   #August 2019-double vision; secondary to refractive error-prism improved ---------------------------------------------------------- # June 2017-Elevated LFTs- valacylovir/diflucan; resolved.   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  Patient denies any unusual lumps or bumps.  Appetite is good.  No weight loss.  Nausea no vomiting.  No headaches.  Review of Systems  Constitutional: Negative for chills, diaphoresis, fever, malaise/fatigue and weight loss.  HENT: Negative for nosebleeds and sore throat.   Eyes: Negative for double vision.  Respiratory: Negative for cough, hemoptysis, sputum production, shortness of breath and wheezing.   Cardiovascular: Negative for chest pain, orthopnea and leg swelling.  Gastrointestinal: Negative for abdominal pain, blood in stool, constipation, diarrhea, heartburn, melena, nausea and vomiting.  Genitourinary: Negative for dysuria, frequency and urgency.  Musculoskeletal: Negative for back pain and joint pain.  Skin: Negative.  Negative for itching and rash.  Neurological: Negative for dizziness, tingling, focal weakness, weakness and headaches.  Endo/Heme/Allergies: Does not bruise/bleed easily.  Psychiatric/Behavioral: Negative for depression. The patient does not have insomnia.       PAST MEDICAL HISTORY :  Past Medical History:  Diagnosis Date  . Arthritis   . Blood dyscrasia   . Cancer ([place])   . Chest pain, unspecified   . Diverticulosis 07/06/15  . Dysrhythmia    tachycardia  . Esophagitis 07/06/15  . Fatigue   . Gastritis 07/06/15  . GERD (gastroesophageal reflux disease)   . 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 Leg cramps   . Lymphoma ([place])  09/14/15   Monoclonal B cell lymphoma  .  Night sweats   . Osteoporosis   . Overweight(278.02)    Obesity  . Palpitations   . Shortness of breath dyspnea   . Tachycardia   . Thrombocytopenia ([place])     PAST SURGICAL HISTORY :   Past Surgical History:  Procedure Laterality Date  . ABDOMINAL HYSTERECTOMY  2005  . BONE MARROW BIOPSY  09/14/15  . CHOLECYSTECTOMY  1997  . COLONOSCOPY  07/05/2014  . ESOPHAGOGASTRODUODENOSCOPY  07/05/2014  . INGUINAL LYMPH NODE BIOPSY Right 10/05/2015   Procedure: INGUINAL LYMPH NODE BIOPSY;  Surgeon: Christene Lye, MD;  Location: [facility] ORS;  Service: General;  Laterality: Right;  . PERIPHERAL VASCULAR CATHETERIZATION N/A 09/27/2015   Procedure: Glori Luis Cath Insertion;  Surgeon: Algernon Huxley, MD;  Location: [place] CV LAB;  Service: Cardiovascular;  Laterality: N/A;  . PORTA CATH REMOVAL Right 06/03/2017   Procedure: PORTA CATH REMOVAL, with venous thrombectomy;  Surgeon: Algernon Huxley, MD;  Location: [place] CV LAB;  Service: Cardiovascular;  Laterality: Right;  . PORTACATH PLACEMENT Right     FAMILY HISTORY :   Family History  Problem Relation Age of Onset  . Heart disease Father        CABG x 4  . Multiple myeloma Father   . Hypertension Mother   . Pancreatic cancer Mother   . Ulcers Mother   . Asthma Mother   . Brain cancer Maternal Uncle   . Multiple myeloma Maternal Uncle   . Diabetes Neg Hx   . Breast cancer Neg Hx     SOCIAL HISTORY:   Social History   Tobacco Use  . Smoking status: Never Smoker  . Smokeless tobacco: Never Used  Substance Use Topics  . Alcohol use: No    Alcohol/week: 0.0 standard drinks  . Drug use: No    ALLERGIES:  is allergic to ondansetron hcl; oxycodone; and rituximab.  MEDICATIONS:  Current Outpatient Medications  Medication Sig Dispense Refill  . acetaminophen (TYLENOL 8 HOUR) 650 MG CR tablet Take 650 mg by mouth every 8 (eight) hours as needed for pain.    Marland Kitchen escitalopram (LEXAPRO) 10 MG  tablet TAKE ONE TABLET EVERY DAY 90 tablet 1  . acyclovir (ZOVIRAX) 400 MG tablet TAKE ONE TABLET TWICE DAILY 60 tablet 3   No current facility-administered medications for this visit.    Facility-Administered Medications Ordered in Other Visits  Medication Dose Route Frequency Provider Last Rate Last Dose  . 0.9 %  sodium chloride infusion   Intravenous Continuous Evlyn Kanner, NP   Stopped at 10/14/15 1312  . methotrexate (50 mg/ml) 6.3 g in sodium chloride 0.9 % 1,000 mL injection   Intravenous Once Charlaine Dalton R, MD      . sodium chloride flush (NS) 0.9 % injection 10 mL  10 mL Intravenous PRN Cammie Sickle, MD   10 mL at 10/05/16 1400    PHYSICAL EXAMINATION: ECOG PERFORMANCE STATUS: 0 - Asymptomatic  BP 120/81   Pulse 74   Temp (!) 97.2 F (36.2 C)   Resp 16   Wt 177 lb 12.8 oz (80.6 kg)   BMI 32.52 kg/m   Filed Weights   12/17/18 1027  Weight: 177 lb 12.8 oz (80.6 kg)   Physical Exam  Constitutional: She is oriented to person, place, and time and well-developed, well-nourished, and in no distress.  Accompanied by husband.  HENT:  Head: Normocephalic and atraumatic.  Mouth/Throat: Oropharynx is clear and moist. No oropharyngeal exudate.  Eyes: Pupils  are equal, round, and reactive to light.  Neck: Normal range of motion. Neck supple.  Cardiovascular: Normal rate and regular rhythm.  Pulmonary/Chest: No respiratory distress. She has no wheezes.  Abdominal: Soft. Bowel sounds are normal. She exhibits no distension and no mass. There is no abdominal tenderness. There is no rebound and no guarding.  Musculoskeletal: Normal range of motion.        General: No tenderness or edema.  Neurological: She is alert and oriented to person, place, and time.  Skin: Skin is warm.  Psychiatric: Affect normal.       LABORATORY DATA:  I have reviewed the data as listed    Component Value Date/Time   NA 140 12/17/2018 1015   NA 139 06/29/2010 0749   K 4.3  12/17/2018 1015   CL 104 12/17/2018 1015   CO2 28 12/17/2018 1015   GLUCOSE 102 (H) 12/17/2018 1015   BUN 15 12/17/2018 1015   BUN 16 06/29/2010 0749   CREATININE 1.04 (H) 12/17/2018 1015   CALCIUM 9.4 12/17/2018 1015   PROT 6.6 12/17/2018 1015   ALBUMIN 4.6 12/17/2018 1015   AST 15 12/17/2018 1015   ALT 9 12/17/2018 1015   ALKPHOS 70 12/17/2018 1015   BILITOT 0.8 12/17/2018 1015   GFRNONAA 57 (L) 12/17/2018 1015   GFRAA >60 12/17/2018 1015    No results found for: SPEP, UPEP  Lab Results  Component Value Date   WBC 4.6 12/17/2018   NEUTROABS 3.0 12/17/2018   HGB 12.7 12/17/2018   HCT 37.2 12/17/2018   MCV 91.2 12/17/2018   PLT 131 (L) 12/17/2018      Chemistry      Component Value Date/Time   NA 140 12/17/2018 1015   NA 139 06/29/2010 0749   K 4.3 12/17/2018 1015   CL 104 12/17/2018 1015   CO2 28 12/17/2018 1015   BUN 15 12/17/2018 1015   BUN 16 06/29/2010 0749   CREATININE 1.04 (H) 12/17/2018 1015   GLU 93 06/29/2010 0749      Component Value Date/Time   CALCIUM 9.4 12/17/2018 1015   ALKPHOS 70 12/17/2018 1015   AST 15 12/17/2018 1015   ALT 9 12/17/2018 1015   BILITOT 0.8 12/17/2018 1015       RADIOGRAPHIC STUDIES: I have personally reviewed the radiological images as listed and agreed with the findings in the report. No results found.   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  # No clinical evidence of recurrence.  Stable.  Platelets 131; white count normal at 4 hemoglobin normal at 12.7.  Discussed treatment options including CarT cell therapy if relapse is noted.  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  # Immunization pending in sep 2020-at [facility].  # DISPOSITION:  # follow up in 3 months-MD /labs [cbc/cmp/ldh]- Dr.B    Orders Placed This Encounter   Procedures  . CBC with Differential    Standing Status:   Future    Standing Expiration Date:   12/17/2019  . Comprehensive metabolic panel    Standing Status:   Future    Standing Expiration Date:   12/17/2019  . Lactate dehydrogenase    Standing Status:   Future    Standing Expiration Date:   12/17/2019   All questions were answered. The patient knows to call the clinic with any  problems, questions or concerns.      Cammie Sickle, MD 12/17/2018 12:35 PM

## 2018-12-17 NOTE — Progress Notes (Signed)
Patient does not offer any problems today.  

## 2018-12-17 NOTE — Assessment & Plan Note (Addendum)
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  # No clinical evidence of recurrence.  Stable.  Platelets 131; white count normal at 4 hemoglobin normal at 12.7.  Discussed treatment options including CarT cell therapy if relapse is noted.  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  # Immunization pending in sep 2020-at [facility].  # DISPOSITION:  # follow up in 3 months-MD /labs [cbc/cmp/ldh]- Dr.B

## 2018-12-19 ENCOUNTER — Other Ambulatory Visit: Payer: Self-pay

## 2018-12-19 ENCOUNTER — Encounter: Payer: BLUE CROSS/BLUE SHIELD | Admitting: Internal Medicine

## 2018-12-22 ENCOUNTER — Ambulatory Visit (INDEPENDENT_AMBULATORY_CARE_PROVIDER_SITE_OTHER): Payer: BLUE CROSS/BLUE SHIELD | Admitting: Internal Medicine

## 2018-12-22 ENCOUNTER — Encounter: Payer: Self-pay | Admitting: Internal Medicine

## 2018-12-22 ENCOUNTER — Other Ambulatory Visit: Payer: Self-pay

## 2018-12-22 VITALS — BP 116/78 | HR 75 | Temp 98.3°F | Resp 16 | Ht 62.0 in | Wt 178.1 lb

## 2018-12-22 DIAGNOSIS — R635 Abnormal weight gain: Secondary | ICD-10-CM

## 2018-12-22 DIAGNOSIS — Z0000 Encounter for general adult medical examination without abnormal findings: Secondary | ICD-10-CM

## 2018-12-22 DIAGNOSIS — Z1239 Encounter for other screening for malignant neoplasm of breast: Secondary | ICD-10-CM

## 2018-12-22 MED ORDER — ESCITALOPRAM OXALATE 10 MG PO TABS: 10.0000 mg | ORAL_TABLET | Freq: Every day | ORAL | 2 refills | Status: DC

## 2018-12-22 MED ORDER — ESCITALOPRAM OXALATE 10 MG PO TABS: 10.0000 mg | ORAL_TABLET | Freq: Every day | ORAL | 1 refills | Status: DC

## 2018-12-22 NOTE — Progress Notes (Signed)
Patient ID: Stacy Murillo, female    DOB: [birth year]  Age: 63 y.o. MRN: [id number]  The patient is here for annual preventive  examination and management of other chronic and acute problems.preventive he risk factors are reflected in the social history.  The roster of all physicians providing medical care to patient - is listed in the Snapshot section of the chart.  Activities of daily living:  The patient is 100% independent in all ADLs: dressing, toileting, feeding as well as independent mobility  Home safety : The patient has smoke detectors in the home. They wear seatbelts.  There are no firearms at home. There is no violence in the home.   There is no risks for hepatitis, STDs or HIV. There is no   history of blood transfusion. They have no travel history to infectious disease endemic areas of the world.  The patient has seen their dentist in the last six month. They have seen their eye doctor in the last year. They admit to slight hearing difficulty with regard to whispered voices and some television programs.  They have deferred audiologic testing in the last year.  They do not  have excessive sun exposure. Discussed the need for sun protection: hats, long sleeves and use of sunscreen if there is significant sun exposure.   Diet: the importance of a healthy diet is discussed. They do have a healthy diet.  The benefits of regular aerobic exercise were discussed. She walks 4 times per week ,  20 minutes.   Depression screen: there are no signs or vegative symptoms of depression- irritability, change in appetite, anhedonia, sadness/tearfullness.  Cognitive assessment: the patient manages all their financial and personal affairs and is actively engaged. They could relate day,date,year and events; recalled 2/3 objects at 3 minutes; performed clock-face test normally.  The following portions of the patient's history were reviewed and updated as appropriate: allergies, current medications, past  family history, past medical history,  past surgical history, past social history  and problem list.  Visual acuity was not assessed per patient preference since she has regular follow up with her ophthalmologist. Hearing and body mass index were assessed and reviewed.   During the course of the visit the patient was educated and counseled about appropriate screening and preventive services including : fall prevention , diabetes screening, nutrition counseling, colorectal cancer screening, and recommended immunizations.    CC: The primary encounter diagnosis was Breast cancer screening. Diagnoses of Weight gain, Routine general medical examination at a health care facility, and Weight gain, abnormal were also pertinent to this visit.  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   She has a past surgical history that includes Abdominal hysterectomy (2005); Bone marrow biopsy (09/14/15); Colonoscopy (07/05/2014); Esophagogastroduodenoscopy (07/05/2014); Portacath placement (Right); Cardiac catheterization (N/A, 09/27/2015); Cholecystectomy (1997); Inguinal lymph node biopsy (Right, 10/05/2015); and PORTA CATH REMOVAL (Right, 06/03/2017).   Her family history includes Asthma in her mother; Brain cancer in her maternal uncle; Heart disease in her father; Hypertension in her mother; Multiple myeloma in her father and maternal uncle; Pancreatic cancer in her mother; Ulcers in her mother.She reports that she has never smoked. She has never used smokeless tobacco. She reports  that she  does not drink alcohol or use drugs.  Outpatient Medications Prior to Visit  Medication Sig Dispense Refill  . acetaminophen (TYLENOL 8 HOUR) 650 MG CR tablet Take 650 mg by mouth every 8 (eight) hours as needed for pain.    Marland Kitchen escitalopram (LEXAPRO) 10 MG tablet TAKE ONE TABLET EVERY DAY 90 tablet 1  . acyclovir (ZOVIRAX) 400 MG tablet TAKE ONE TABLET TWICE DAILY 60 tablet 3   Facility-Administered Medications Prior to Visit  Medication Dose Route Frequency Provider Last Rate Last Dose  . 0.9 %  sodium chloride infusion   Intravenous Continuous Evlyn Kanner, NP   Stopped at 10/14/15 1312  . methotrexate (50 mg/ml) 6.3 g in sodium chloride 0.9 % 1,000 mL injection   Intravenous Once Charlaine Dalton R, MD      . sodium chloride flush (NS) 0.9 % injection 10 mL  10 mL Intravenous PRN Cammie Sickle, MD   10 mL at 10/05/16 1400    Review of Systems   Patient denies headache, fevers, malaise, unintentional weight loss, skin rash, eye pain, sinus congestion and sinus pain, sore throat, dysphagia,  hemoptysis , cough, dyspnea, wheezing, chest pain, palpitations, orthopnea, edema, abdominal pain, nausea, melena, diarrhea, constipation, flank pain, dysuria, hematuria, urinary  Frequency, nocturia, numbness, tingling, seizures,  Focal weakness, Loss of consciousness,  Tremor, insomnia, depression, anxiety, and suicidal ideation.      Objective:  BP 116/78 (BP Location: Left Arm, Patient Position: Sitting, Cuff Size: Large)   Pulse 75   Temp 98.3 F (36.8 C) (Oral)   Resp 16   Ht _0  (1.575 m)   Wt 178 lb 1.9 oz (80.8 kg)   SpO2 98%   BMI 32.58 kg/m   Physical Exam   General appearance: alert, cooperative and appears stated age Head: Normocephalic, without obvious abnormality, atraumatic Eyes: conjunctivae/corneas clear. PERRL, EOM's intact. Fundi benign. Ears: normal TM's and external ear canals both ears Nose: Nares normal. Septum midline. Mucosa normal. No  drainage or sinus tenderness. Throat: lips, mucosa, and tongue normal; teeth and gums normal Neck: no adenopathy, no carotid bruit, no JVD, supple, symmetrical, trachea midline and thyroid not enlarged, symmetric, no tenderness/mass/nodules Lungs: clear to auscultation bilaterally Breasts: normal appearance, no masses or tenderness Heart: regular rate and rhythm, S1, S2 normal, no murmur, click, rub or gallop Abdomen: soft, non-tender; bowel sounds normal; no masses,  no organomegaly Extremities: extremities normal, atraumatic, no cyanosis or edema Pulses: 2+ and symmetric Skin: Skin color, texture, turgor normal. No rashes or lesions Neurologic: Alert and oriented X 3, normal strength and tone. Normal symmetric reflexes. Normal coordination and gait.      Assessment & Plan:   Problem List Items Addressed This Visit      Unprioritized   Routine general medical examination at a health care facility    age appropriate education and counseling updated, referrals for preventative services and immunizations addressed, dietary and smoking counseling addressed, most recent labs reviewed.  I have personally reviewed and have noted:  1) the patient's medical and social history 2) The pt's use of alcohol, tobacco, and illicit drugs 3) The patient's current medications and supplements 4) Functional ability including ADL's, fall risk, home safety risk, hearing and visual impairment 5) Diet and physical activities 6) Evidence for depression or mood disorder 7) The patient's height, weight, and BMI have been recorded in the chart  I have made referrals, and provided counseling and education based on review of the above  Weight gain, abnormal    Screening for thyroid,  Diabetes,  Hyperlipidemia   Lab Results  Component Value Date   TSH 2.30 12/22/2018   Lab Results  Component Value Date   HGBA1C 5.4 12/22/2018          Other Visit Diagnoses    Breast cancer screening    -   Primary   Relevant Orders   MM 3D SCREEN BREAST BILATERAL   Weight gain       Relevant Orders   Hemoglobin A1c (Completed)   Comprehensive metabolic panel (Completed)   Lipid panel (Completed)   TSH (Completed)      I have discontinued Ulis Rias B. Macauley's acyclovir. I am also having her maintain her acetaminophen and escitalopram.  Meds ordered this encounter  Medications  . DISCONTD: escitalopram (LEXAPRO) 10 MG tablet    Sig: Take 1 tablet (10 mg total) by mouth daily.    Dispense:  90 tablet    Refill:  1    PATIENT NEEDS REFILLSTHANKS  . escitalopram (LEXAPRO) 10 MG tablet    Sig: Take 1 tablet (10 mg total) by mouth daily.    Dispense:  90 tablet    Refill:  2    Medications Discontinued During This Encounter  Medication Reason  . acyclovir (ZOVIRAX) 400 MG tablet Error  . escitalopram (LEXAPRO) 10 MG tablet Reorder  . escitalopram (LEXAPRO) 10 MG tablet Reorder    Follow-up: No follow-ups on file.   Crecencio Mc, MD

## 2018-12-22 NOTE — Patient Instructions (Signed)

## 2018-12-23 ENCOUNTER — Encounter: Payer: Self-pay | Admitting: Internal Medicine

## 2018-12-23 DIAGNOSIS — R635 Abnormal weight gain: Secondary | ICD-10-CM | POA: Insufficient documentation

## 2018-12-23 LAB — COMPREHENSIVE METABOLIC PANEL
ALT: 8 U/L (ref 0–35)
AST: 11 U/L (ref 0–37)
Albumin: 4.6 g/dL (ref 3.5–5.2)
Alkaline Phosphatase: 74 U/L (ref 39–117)
BUN: 13 mg/dL (ref 6–23)
CO2: 30 mEq/L (ref 19–32)
Calcium: 9.6 mg/dL (ref 8.4–10.5)
Chloride: 101 mEq/L (ref 96–112)
Creatinine, Ser: 1.04 mg/dL (ref 0.40–1.20)
GFR: 53.26 mL/min — ABNORMAL LOW (ref >60.00)
Glucose, Bld: 95 mg/dL (ref 70–99)
Potassium: 4.2 mEq/L (ref 3.5–5.1)
Sodium: 139 mEq/L (ref 135–145)
Total Bilirubin: 0.5 mg/dL (ref 0.2–1.2)
Total Protein: 5.8 g/dL — ABNORMAL LOW (ref 6.0–8.3)

## 2018-12-23 LAB — LDL CHOLESTEROL, DIRECT: Direct LDL: 142 mg/dL

## 2018-12-23 LAB — TSH: TSH: 2.3 u[IU]/mL (ref 0.35–4.50)

## 2018-12-23 LAB — LIPID PANEL
Cholesterol: 242 mg/dL — ABNORMAL HIGH (ref 0–200)
HDL: 59.6 mg/dL (ref >39.00)
NonHDL: 182.3
Total CHOL/HDL Ratio: 4
Triglycerides: 269 mg/dL — ABNORMAL HIGH (ref 0.0–149.0)
VLDL: 53.8 mg/dL — ABNORMAL HIGH (ref 0.0–40.0)

## 2018-12-23 LAB — HEMOGLOBIN A1C: Hgb A1c MFr Bld: 5.4 % (ref 4.6–6.5)

## 2018-12-23 NOTE — Assessment & Plan Note (Signed)
Screening for thyroid,  Diabetes,  Hyperlipidemia   Lab Results  Component Value Date   TSH 2.30 12/22/2018   Lab Results  Component Value Date   HGBA1C 5.4 12/22/2018

## 2018-12-23 NOTE — Assessment & Plan Note (Signed)

## 2019-02-04 ENCOUNTER — Ambulatory Visit: Payer: BLUE CROSS/BLUE SHIELD

## 2019-02-10 ENCOUNTER — Ambulatory Visit: Payer: Self-pay | Admitting: *Deleted

## 2019-02-10 ENCOUNTER — Other Ambulatory Visit: Payer: Self-pay

## 2019-02-10 DIAGNOSIS — Z20822 Contact with and (suspected) exposure to covid-19: Secondary | ICD-10-CM

## 2019-02-10 DIAGNOSIS — R110 Nausea: Secondary | ICD-10-CM

## 2019-02-10 NOTE — Addendum Note (Signed)
Addended by: Crecencio Mc on: 02/10/2019 01:08 PM   Modules accepted: Orders

## 2019-02-10 NOTE — Telephone Encounter (Signed)
Contacted pt regarding her nausea, and attempts to vomit; she has anti-nausea medicine (Zofran & Compazine) left from cancer treatment and has helped; it started on 02/04/2019 and has progressively worse; the pt says that otherwise she feel ok; recommendations made per nurse triage protocol; she verbalized understanding, and says that she will be going out of town on Thursday for her son's wedding; the pt sees Dr Derrel Nip, [facility]; she can be contacted at [phone]; will route to office for final dispoition.  Reason for Disposition . Nausea lasts > 1 week  Answer Assessment - Initial Assessment Questions 1. NAUSEA SEVERITY: "How bad is the nausea?" (e.g., mild, moderate, severe; dehydration, weight loss)   - MILD: loss of appetite without change in eating habits   - MODERATE: decreased oral intake without significant weight loss, dehydration, or malnutrition   - SEVERE: inadequate caloric or fluid intake, significant weight loss, symptoms of dehydration     moderate 2. ONSET: "When did the nausea begin?"     12/04/2018 3. VOMITING: "Any vomiting?" If so, ask: "How many times today?"     Yes; happens every time she eats 4. RECURRENT SYMPTOM: "Have you had nausea before?" If so, ask: "When was the last time?" "What happened that time?"     no 5. CAUSE: "What do you think is causing the nausea?"    No idea 6. PREGNANCY: "Is there any chance you are pregnant?" (e.g., unprotected intercourse, missed birth control pill, broken condom)    no  Protocols used: NAUSEA-A-AH

## 2019-02-10 NOTE — Telephone Encounter (Signed)
No appts left today. Please advise.

## 2019-02-10 NOTE — Telephone Encounter (Signed)
I suggest she get COVID TESTING TODAY AND HAVE ORDERED THE TEST   BECAUSE  IN THE ABSENCE OF OTHER SYMPTOMS (ABD PAIN, UTI  SYMPTOMS  THERE IS NO OTHER DIAGNOSIS MORE PROBABLE AND MORE LIKELY TO BE CONTAGIOUS THAN COVID

## 2019-02-10 NOTE — Telephone Encounter (Signed)
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

## 2019-02-11 LAB — NOVEL CORONAVIRUS, NAA: SARS-CoV-2, NAA: NOT DETECTED

## 2019-02-12 ENCOUNTER — Ambulatory Visit: Payer: BLUE CROSS/BLUE SHIELD | Admitting: Internal Medicine

## 2019-02-16 ENCOUNTER — Other Ambulatory Visit: Payer: Self-pay

## 2019-02-16 ENCOUNTER — Inpatient Hospital Stay: Payer: BLUE CROSS/BLUE SHIELD

## 2019-02-16 ENCOUNTER — Other Ambulatory Visit: Payer: Self-pay | Admitting: Oncology

## 2019-02-16 ENCOUNTER — Other Ambulatory Visit: Payer: Self-pay | Admitting: *Deleted

## 2019-02-16 ENCOUNTER — Inpatient Hospital Stay (HOSPITAL_BASED_OUTPATIENT_CLINIC_OR_DEPARTMENT_OTHER): Payer: BLUE CROSS/BLUE SHIELD | Admitting: Oncology

## 2019-02-16 ENCOUNTER — Inpatient Hospital Stay: Payer: BLUE CROSS/BLUE SHIELD | Attending: Oncology

## 2019-02-16 ENCOUNTER — Telehealth: Payer: Self-pay | Admitting: *Deleted

## 2019-02-16 ENCOUNTER — Encounter: Payer: Self-pay | Admitting: Internal Medicine

## 2019-02-16 ENCOUNTER — Telehealth: Payer: Self-pay | Admitting: Internal Medicine

## 2019-02-16 ENCOUNTER — Encounter: Payer: Self-pay | Admitting: Oncology

## 2019-02-16 VITALS — BP 110/75 | HR 80 | Temp 97.7°F | Resp 16 | Wt 167.2 lb

## 2019-02-16 DIAGNOSIS — R350 Frequency of micturition: Secondary | ICD-10-CM

## 2019-02-16 DIAGNOSIS — R112 Nausea with vomiting, unspecified: Secondary | ICD-10-CM | POA: Diagnosis not present

## 2019-02-16 DIAGNOSIS — R634 Abnormal weight loss: Secondary | ICD-10-CM | POA: Insufficient documentation

## 2019-02-16 DIAGNOSIS — E860 Dehydration: Secondary | ICD-10-CM | POA: Diagnosis not present

## 2019-02-16 DIAGNOSIS — Z79899 Other long term (current) drug therapy: Secondary | ICD-10-CM | POA: Diagnosis not present

## 2019-02-16 DIAGNOSIS — C8583 Other specified types of non-Hodgkin lymphoma, intra-abdominal lymph nodes: Secondary | ICD-10-CM | POA: Diagnosis not present

## 2019-02-16 DIAGNOSIS — R7989 Other specified abnormal findings of blood chemistry: Secondary | ICD-10-CM

## 2019-02-16 DIAGNOSIS — C8338 Diffuse large B-cell lymphoma, lymph nodes of multiple sites: Secondary | ICD-10-CM | POA: Diagnosis present

## 2019-02-16 DIAGNOSIS — B370 Candidal stomatitis: Secondary | ICD-10-CM | POA: Diagnosis not present

## 2019-02-16 LAB — URINALYSIS, COMPLETE (UACMP) WITH MICROSCOPIC
Bacteria, UA: NONE SEEN
Bilirubin Urine: NEGATIVE
Glucose, UA: NEGATIVE mg/dL
Ketones, ur: 5 mg/dL — AB
Nitrite: NEGATIVE
Protein, ur: 100 mg/dL — AB
Specific Gravity, Urine: 1.019 (ref 1.005–1.030)
Squamous Epithelial / HPF: NONE SEEN (ref 0–5)
WBC, UA: >50 WBC/hpf — ABNORMAL HIGH (ref 0–5)
pH: 6 (ref 5.0–8.0)

## 2019-02-16 LAB — COMPREHENSIVE METABOLIC PANEL
ALT: 25 U/L (ref 0–44)
AST: 19 U/L (ref 15–41)
Albumin: 4.7 g/dL (ref 3.5–5.0)
Alkaline Phosphatase: 73 U/L (ref 38–126)
Anion gap: 12 (ref 5–15)
BUN: 19 mg/dL (ref 8–23)
CO2: 26 mmol/L (ref 22–32)
Calcium: 9.2 mg/dL (ref 8.9–10.3)
Chloride: 102 mmol/L (ref 98–111)
Creatinine, Ser: 1.63 mg/dL — ABNORMAL HIGH (ref 0.44–1.00)
GFR calc Af Amer: 38 mL/min — ABNORMAL LOW (ref >60)
GFR calc non Af Amer: 33 mL/min — ABNORMAL LOW (ref >60)
Glucose, Bld: 126 mg/dL — ABNORMAL HIGH (ref 70–99)
Potassium: 3.9 mmol/L (ref 3.5–5.1)
Sodium: 140 mmol/L (ref 135–145)
Total Bilirubin: 1.2 mg/dL (ref 0.3–1.2)
Total Protein: 6.6 g/dL (ref 6.5–8.1)

## 2019-02-16 LAB — CBC WITH DIFFERENTIAL/PLATELET
Abs Immature Granulocytes: 0.03 10*3/uL (ref 0.00–0.07)
Basophils Absolute: 0.1 10*3/uL (ref 0.0–0.1)
Basophils Relative: 1 %
Eosinophils Absolute: 0.1 10*3/uL (ref 0.0–0.5)
Eosinophils Relative: 1 %
HCT: 38.9 % (ref 36.0–46.0)
Hemoglobin: 13.4 g/dL (ref 12.0–15.0)
Immature Granulocytes: 0 %
Lymphocytes Relative: 14 %
Lymphs Abs: 1.2 10*3/uL (ref 0.7–4.0)
MCH: 30.9 pg (ref 26.0–34.0)
MCHC: 34.4 g/dL (ref 30.0–36.0)
MCV: 89.8 fL (ref 80.0–100.0)
Monocytes Absolute: 0.5 10*3/uL (ref 0.1–1.0)
Monocytes Relative: 6 %
Neutro Abs: 7 10*3/uL (ref 1.7–7.7)
Neutrophils Relative %: 78 %
Platelets: 135 10*3/uL — ABNORMAL LOW (ref 150–400)
RBC: 4.33 MIL/uL (ref 3.87–5.11)
RDW: 12.3 % (ref 11.5–15.5)
WBC: 8.9 10*3/uL (ref 4.0–10.5)
nRBC: 0 % (ref 0.0–0.2)

## 2019-02-16 LAB — LACTATE DEHYDROGENASE: LDH: 125 U/L (ref 98–192)

## 2019-02-16 LAB — MAGNESIUM: Magnesium: 2.2 mg/dL (ref 1.7–2.4)

## 2019-02-16 MED ORDER — FLUCONAZOLE 200 MG PO TABS: 200.0000 mg | ORAL_TABLET | Freq: Every day | ORAL | 0 refills | Status: DC

## 2019-02-16 MED ORDER — CIPROFLOXACIN HCL 500 MG PO TABS: 500.0000 mg | ORAL_TABLET | Freq: Two times a day (BID) | ORAL | 0 refills | Status: DC

## 2019-02-16 MED ORDER — NYSTATIN 100000 UNIT/ML MT SUSP: 5.0000 mL | Freq: Four times a day (QID) | OROMUCOSAL | 0 refills | Status: DC

## 2019-02-16 MED ORDER — SODIUM CHLORIDE 0.9 % IV SOLN
Freq: Once | INTRAVENOUS | Status: AC
  Administered 2019-02-16: 12:00:00 via INTRAVENOUS
  Filled 2019-02-16: qty 250

## 2019-02-16 NOTE — Progress Notes (Signed)
UA positive for UTI. RX Cipro 500 mg BID X 7 days.   Faythe Casa, NP 02/16/2019 3:13 PM

## 2019-02-16 NOTE — Telephone Encounter (Signed)
Patient husband called reporting that she is nauseated and that the zofran and compazine are not controlling it and that she has not been able to eat for 2 weeks because of it. He has called her GI doctor and they cannot see her for a while. He is asking if Dr B can help with this. Please advise

## 2019-02-16 NOTE — Patient Instructions (Signed)
I am so sorry you are not feeling well today.  Today while you were here we gave you 1 L of normal saline (IV fluids) and 10 mg Compazine for nausea.  Continue Zofran or Compazine as needed for nausea.  Your labs revealed an elevated creatinine which typically means dehydration.  Continue to try to push fluids as much as you can.  Once the thrush resolves, normal oral intake including liquids and food should be tolerated.  I have sent 2 prescriptions to your pharmacy for thrush.  They are Diflucan and nystatin.  Please call with any questions or concerns.  It was so nice to meet you.  Dr. Rogue Bussing would like to see you back in approximately 2 weeks to make sure that symptoms have resolved completely.  Faythe Casa, NP 02/16/2019 12:19 PM  Fluconazole tablets What is this medicine? FLUCONAZOLE (floo KON na zole) is an antifungal medicine. It is used to treat certain kinds of fungal or yeast infections. This medicine may be used for other purposes; ask your health care provider or pharmacist if you have questions. COMMON BRAND NAME(S): Diflucan What should I tell my health care provider before I take this medicine? They need to know if you have any of these conditions:  history of irregular heart beat  kidney disease  an unusual or allergic reaction to fluconazole, other azole antifungals, medicines, foods, dyes, or preservatives  pregnant or trying to get pregnant  breast-feeding How should I use this medicine? Take this medicine by mouth. Follow the directions on the prescription label. Do not take your medicine more often than directed. Talk to your pediatrician regarding the use of this medicine in children. Special care may be needed. This medicine has been used in children as young as 43 months of age. Overdosage: If you think you have taken too much of this medicine contact a poison control center or emergency room at once. NOTE: This medicine is only for you. Do not share this  medicine with others. What if I miss a dose? If you miss a dose, take it as soon as you can. If it is almost time for your next dose, take only that dose. Do not take double or extra doses. What may interact with this medicine? Do not take this medicine with any of the following medications:  astemizole  certain medicines for irregular heart beat like dronedarone, quinidine  cisapride  erythromycin  lomitapide  other medicines that prolong the QT interval (cause an abnormal heart rhythm)  pimozide  terfenadine  thioridazine  tolvaptan This medicine may also interact with the following medications:  antiviral medicines for HIV or AIDS  birth control pills  certain antibiotics like rifabutin, rifampin  certain medicines for blood pressure like amlodipine, isradipine, felodipine, hydrochlorothiazide, losartan, nifedipine  certain medicines for cancer like cyclophosphamide, vinblastine, vincristine  certain medicines for cholesterol like atorvastatin, lovastatin, fluvastatin, simvastatin  certain medicines for depression, anxiety, or psychotic disturbances like amitriptyline, midazolam, nortriptyline, triazolam  certain medicines for diabetes like glipizide, glyburide, tolbutamide  certain medicines for pain like alfentanil, fentanyl, methadone  certain medicines for seizures like carbamazepine, phenytoin  certain medicines that treat or prevent blood clots like warfarin  dofetilide  halofantrine  medicines that lower your chance of fighting infection like cyclosporine, prednisone, tacrolimus  NSAIDS, medicines for pain and inflammation, like celecoxib, diclofenac, flurbiprofen, ibuprofen, meloxicam, naproxen  other medicines for fungal infections  sirolimus  theophylline  tofacitinib  ziprasidone This list may not describe all possible interactions.  Give your health care provider a list of all the medicines, herbs, non-prescription drugs, or dietary  supplements you use. Also tell them if you smoke, drink alcohol, or use illegal drugs. Some items may interact with your medicine. What should I watch for while using this medicine? Visit your doctor or health care professional for regular checkups. If you are taking this medicine for a long time you may need blood work. Tell your doctor if your symptoms do not improve. Some fungal infections need many weeks or months of treatment to cure. Alcohol can increase possible damage to your liver. Avoid alcoholic drinks. If you have a vaginal infection, do not have sex until you have finished your treatment. You can wear a sanitary napkin. Do not use tampons. Wear freshly washed cotton, not synthetic, panties. What side effects may I notice from receiving this medicine? Side effects that you should report to your doctor or health care professional as soon as possible:  allergic reactions like skin rash or itching, hives, swelling of the lips, mouth, tongue, or throat  dark urine  feeling dizzy or faint  irregular heartbeat or chest pain  redness, blistering, peeling or loosening of the skin, including inside the mouth  trouble breathing  unusual bruising or bleeding  vomiting  yellowing of the eyes or skin Side effects that usually do not require medical attention (report to your doctor or health care professional if they continue or are bothersome):  changes in how food tastes  diarrhea  headache  stomach upset or nausea This list may not describe all possible side effects. Call your doctor for medical advice about side effects. You may report side effects to FDA at 1-800-FDA-1088. Where should I keep my medicine? Keep out of the reach of children. Store at room temperature below 30 degrees C (86 degrees F). Throw away any medicine after the expiration date. NOTE: This sheet is a summary. It may not cover all possible information. If you have questions about this medicine, talk to  your doctor, pharmacist, or health care provider.  2020 Elsevier/Gold Standard (2018-04-21 11:58:26)  Nystatin oral suspension What is this medicine? NYSTATIN (nye STAT in) is an antifungal medicine. It is used to treat certain kinds of fungal or yeast infections. This medicine may be used for other purposes; ask your health care provider or pharmacist if you have questions. COMMON BRAND NAME(S): Mycostatin, Nystex What should I tell my health care provider before I take this medicine? They need to know if you have any of these conditions:  diabetes  kidney disease  an unusual or allergic reaction to nystatin, ethylenediamine, parabens, thimerosal, other foods, dyes or preservatives  pregnant or trying to get pregnant  breast-feeding How should I use this medicine? Follow the directions on the prescription label. Shake well before using. Use a specially marked dropper to measure every dose. Ask your pharmacist if you do not have one. Put one half of the dose in each side of your mouth. Swish the medicine around in your mouth and gargle. Hold your dose in your mouth for as long as you can. Swallow or spit out as directed by your doctor. Take your medicine at regular intervals. Do not take your medicine more often than directed. Do not skip doses or stop your medicine early even if you feel better. Do not stop taking except on your doctor's advice. Talk to your pediatrician regarding the use of this medicine in children. Special care may be needed. Overdosage: If you  think you have taken too much of this medicine contact a poison control center or emergency room at once. NOTE: This medicine is only for you. Do not share this medicine with others. What if I miss a dose? If you miss a dose, take it as soon as you can. If it is almost time for your next dose, take only that dose. Do not take double or extra doses. What may interact with this medicine? Interactions are not expected. This list  may not describe all possible interactions. Give your health care provider a list of all the medicines, herbs, non-prescription drugs, or dietary supplements you use. Also tell them if you smoke, drink alcohol, or use illegal drugs. Some items may interact with your medicine. What should I watch for while using this medicine? Tell your doctor or health care professional if your symptoms do not improve or get worse. If you wear dentures talk to your doctor about how to clean them. What side effects may I notice from receiving this medicine? Side effects that you should report to your doctor or health care professional as soon as possible:  allergic reactions like skin rash, itching or hives, swelling of the face, lips, or tongue  fast heart beat  redness, blistering, peeling or loosening of the skin, including inside the mouth  trouble breathing Side effects that usually do not require medical attention (report to your doctor or health care professional if they continue or are bothersome):  diarrhea  muscle aches or pains  nausea, vomiting  stomach upset This list may not describe all possible side effects. Call your doctor for medical advice about side effects. You may report side effects to FDA at 1-800-FDA-1088. Where should I keep my medicine? Keep out of the reach of children. Store at room temperature between 15 and 25 degrees C (59 and 77 degrees F). Protect from light. Throw away any unused medicine after the expiration date. NOTE: This sheet is a summary. It may not cover all possible information. If you have questions about this medicine, talk to your doctor, pharmacist, or health care provider.  2020 Elsevier/Gold Standard (2008-06-01 13:21:00)    Oral Thrush, Adult  Oral thrush is an infection in your mouth and throat. It causes white patches on your tongue and in your mouth. Follow these instructions at home: Helping with soreness   To lessen your pain: ? Drink  cold liquids, like water and iced tea. ? Eat frozen ice pops or frozen juices. ? Eat foods that are easy to swallow, like gelatin and ice cream. ? Drink from a straw if the patches in your mouth are painful. General instructions  Take or use over-the-counter and prescription medicines only as told by your doctor. Medicine for oral thrush may be something to swallow, or it may be something to put on the infected area.  Eat plain yogurt that has live cultures in it. Read the label to make sure.  If you wear dentures: ? Take out your dentures before you go to bed. ? Brush them well. ? Soak them in a denture cleaner.  Rinse your mouth with warm salt-water many times a day. To make the salt-water mixture, completely dissolve 1/2-1 teaspoon of salt in 1 cup of warm water. Contact a doctor if:  Your problems are getting worse.  Your problems do not get better in less than 7 days with treatment.  Your infection is spreading. This may show as white patches on the skin outside of your  mouth.  You are nursing your baby and you have redness and pain in the nipples. This information is not intended to replace advice given to you by your health care provider. Make sure you discuss any questions you have with your health care provider. Document Released: 07/25/2009 Document Revised: 08/02/2017 Document Reviewed: 01/23/2016 Elsevier Patient Education  2020 [employer].

## 2019-02-16 NOTE — Progress Notes (Addendum)
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 CT- 1-2 CM LN subpectoral/medistinal/ retro-peritoneal/pelvic/ Right inguinal LN 1.6cm- Bx- DLBCL- ABC; myc-POS; FISH gene re-arragement-NEG. PET- MULTIPLE LN/ Bone involvement; R-CHOP x6- CR'# OCT 2017- PET scan- CR; DEC 22nd 2017- BMBx- "atypical large B cells- <1%  [[facility]; II opinion- Dr.Grover- review of path- not concerning for residual lymphoma]; recommend surviellance  # March 26th  2018PET- NED  # Lumbar puncture- difficult-spinal headache. S/p high dose MXT x4.  ------------------------------------------------------------------- # NOV 2018-recurrent diffuse large B-cell lymphoma [right axilla lymph node biopsy proven]; PET- multiple bone lesions; axillary adenopathy; splenomegaly uptake  # Nov 16th 2018- R-; s/p 3 cycles-CR' Auto-Stem cell Transplant date: 07/11/17 [facility]; Dr.Vincent]; May 1st 2019- PET CR.   #August 2019-double vision; secondary to refractive error-prism improved ---------------------------------------------------------- # June 2017-Elevated LFTs- valacylovir/diflucan; resolved.    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  She is able to tolerate liquids well.  States symptoms started after getting recent immunizations at [facility].  States she also has noticed her tongue has a coating on it.  She brushes her teeth several times per day and is unable to get coating off.  States her gag reflex is very sensitive.  Sometimes she vomits while brushing her teeth.  Endorses intermittent urinary urgency but denies dysuria or flank pain. She denies any new medications.  She denies any suspicious foods or drinks.  She denies any fevers or illnesses, chest pain, constipation or diarrhea.    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  She is currently not on any prophylactic medications.  She was evaluated last month by Dr. Rogue Bussing and she was doing well.  Lab work was stable.  She was [facility] hepatitis B, Haemophilus B poly-sac tetanus toxoid, flu and pneumonia.   ECOG FS:1 - Symptomatic but completely ambulatory  Review of systems- Review of Systems  Constitutional: Positive for malaise/fatigue.  HENT:       White coating on tongue  Gastrointestinal: Positive for nausea and vomiting.  Genitourinary: Positive for urgency.     Current treatment-status post autoLog a stem cell transplant in February 2019-active surveillance.  Allergies  Allergen Reactions  . Ondansetron Hcl Other (See Comments)    Severe  constipation   . Oxycodone     Biliary colic with opioids s/p cholecystectomy    . Rituximab Other (See Comments)    Pt reports throat tightness and facial swelling and redness during infusion of Rituxan     Past Medical History:  Diagnosis Date  . Arthritis   . Blood dyscrasia   . Cancer ([place])   . Chest pain, unspecified   . Diverticulosis 07/06/15  . Dysrhythmia    tachycardia  . Esophagitis 07/06/15  . Fatigue   . Gastritis 07/06/15  . GERD (gastroesophageal reflux disease)   . 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 Leg cramps   . Lymphoma ([place]) 09/14/15   Monoclonal B cell lymphoma  . Night sweats   . Osteoporosis   . Overweight(278.02)    Obesity  . Palpitations   . Shortness of breath dyspnea   . Tachycardia   . Thrombocytopenia ([place])      Past Surgical History:  Procedure Laterality Date  . ABDOMINAL HYSTERECTOMY  2005  . BONE MARROW BIOPSY  09/14/15  . CHOLECYSTECTOMY  1997  . COLONOSCOPY  07/05/2014  . ESOPHAGOGASTRODUODENOSCOPY  07/05/2014  . INGUINAL LYMPH NODE BIOPSY Right 10/05/2015   Procedure: INGUINAL LYMPH NODE BIOPSY;  Surgeon: Christene Lye, MD;  Location: [facility] ORS;  Service: General;  Laterality: Right;  . PERIPHERAL VASCULAR CATHETERIZATION N/A 09/27/2015   Procedure: Glori Luis Cath Insertion;  Surgeon: Algernon Huxley, MD;  Location: [place] CV LAB;  Service: Cardiovascular;  Laterality: N/A;  . PORTA CATH REMOVAL Right 06/03/2017   Procedure: PORTA CATH REMOVAL, with venous thrombectomy;  Surgeon: Algernon Huxley, MD;  Location: [place] CV LAB;  Service: Cardiovascular;  Laterality: Right;  . PORTACATH PLACEMENT Right     Social History   Socioeconomic History  . Marital status: Married    Spouse name: Not on file  . Number of children: Not on file  . Years of education: Not on file  . Highest education level: Not on file  Occupational History  . Not on file  Social Needs  . Financial  resource strain: Not on file  . Food insecurity    Worry: Not on file    Inability: Not on file  . Transportation needs    Medical: Not on file    Non-medical: Not on file  Tobacco Use  . Smoking status: Never Smoker  . Smokeless tobacco: Never Used  Substance and Sexual Activity  . Alcohol use: No    Alcohol/week: 0.0 standard drinks  . Drug use: No  . Sexual activity: Yes    Birth control/protection: None  Lifestyle  . Physical activity    Days per week: Not on file    Minutes per session: Not on file  . Stress: Not on file  Relationships  . Social Herbalist on phone: Not on file    Gets together: Not on file    Attends religious service: Not on file    Active member of club or organization: Not on file    Attends meetings of clubs or organizations: Not on file    Relationship status: Not on file  . Intimate partner violence    Fear of current or ex partner: Not on file    Emotionally abused: Not on file    Physically abused: Not on file    Forced sexual activity: Not on file  Other Topics Concern  . Not on file  Social History Narrative   Married   Does not get regular exercise    Family History  Problem Relation Age of Onset  . Heart disease Father        CABG x 4  . Multiple myeloma Father   . Hypertension Mother   . Pancreatic cancer Mother   . Ulcers Mother   . Asthma Mother   . Brain cancer Maternal Uncle   . Multiple myeloma Maternal Uncle   . Diabetes Neg Hx   . Breast cancer Neg Hx      Current Outpatient Medications:  .  acetaminophen (TYLENOL 8 HOUR) 650 MG CR tablet, Take 650 mg by mouth every 8 (eight) hours as needed for pain., Disp: , Rfl:  .  escitalopram (LEXAPRO) 10 MG tablet, Take 1 tablet (10 mg total) by mouth daily., Disp: 90 tablet, Rfl: 2 .  ondansetron (ZOFRAN) 4 MG tablet, Take 4 mg by mouth every 8 (eight) hours as needed for nausea or vomiting., Disp: , Rfl:  .  prochlorperazine (COMPAZINE) 10 MG tablet, Take 10 mg  by mouth every 6 (six) hours as needed for nausea or vomiting., Disp: , Rfl:  .  fluconazole (DIFLUCAN) 200 MG tablet, Take 1 tablet (200 mg total) by mouth daily., Disp: 10 tablet, Rfl: 0 .  nystatin (MYCOSTATIN) 100000 UNIT/ML suspension, Take 5 mLs (500,000 Units total) by mouth 4 (four) times daily., Disp: 473 mL, Rfl: 0 No current facility-administered medications for this visit.   Facility-Administered Medications Ordered in Other Visits:  .  0.9 %  sodium chloride infusion, , Intravenous, Continuous, Herring, Orville Govern, NP, Stopped at 10/14/15 1312 .  methotrexate (50 mg/ml) 6.3 g in sodium chloride 0.9 % 1,000 mL injection, , Intravenous, Once, Brahmanday, Govinda R, MD .  sodium chloride flush (NS) 0.9 % injection 10 mL, 10 mL, Intravenous, PRN, Cammie Sickle, MD, 10 mL at 10/05/16 1400  Physical exam:  Vitals:   02/16/19 1047 02/16/19 1050  BP: 110/75   Pulse: 80   Resp: 16   Temp: 98.1 F (36.7 C) 97.7 F (36.5 C)  TempSrc: Tympanic Tympanic  Weight: 167 lb 3.2 oz (75.8 kg)    Physical Exam HENT:     Mouth/Throat:     Pharynx: No oropharyngeal exudate.     Comments: White plaque on tongue Neurological:     Mental Status: She is alert.     Media Information    Document Information   CMP Latest Ref Rng & Units 02/16/2019  Glucose 70 - 99 mg/dL 126(H)  BUN 8 - 23 mg/dL 19  Creatinine 0.44 - 1.00 mg/dL 1.63(H)  Sodium 135 - 145 mmol/L 140  Potassium 3.5 - 5.1 mmol/L 3.9  Chloride 98 - 111 mmol/L 102  CO2 22 - 32 mmol/L 26  Calcium 8.9 - 10.3 mg/dL 9.2  Total Protein 6.5 - 8.1 g/dL 6.6  Total Bilirubin 0.3 - 1.2 mg/dL 1.2  Alkaline Phos 38 - 126 U/L 73  AST 15 - 41 U/L 19  ALT 0 - 44 U/L 25   CBC Latest Ref Rng & Units 02/16/2019  WBC 4.0 - 10.5 K/uL 8.9  Hemoglobin 12.0 - 15.0 g/dL 13.4  Hematocrit 36.0 - 46.0 % 38.9  Platelets 150 - 400 K/uL 135(L)    No images are attached to the encounter.  No results found.  Assessment and plan- Patient  is a 63 y.o. female who presents to [facility] for nausea and vomiting for about 2-1/2 weeks.  Large B-cell lymphoma: Status post autoLogus stem cell transplant in February 2019.  Currently on surveillance.  Followed by [facility] bone marrow transplant team.  Recently had 38-monthimmunizations which included pneumonia, flu, Tdap and hepatitis B.  No clinical evidence of recurrence.  Blood counts have been stable.  Plan is for surveillance at this time.  Last imaging was PET scan from May 2019 which was negative for disease.  They discussed additional treatment options including car T-cell therapy if she relapses.  Nausea/vomiting: Likely due to the presence of oral candidiasis.  On examination patient has white plaques on the palate and tongue.  She has not recently been on steroids.  Given, she is dehydrated due to vomiting from oral candidiasis we will put her on both fluconazole and nystatin suspension for treatment.   Dehydration: Due to oral candidiasis causing her nausea and vomiting.  Continue antinausea medicine.  She will receive IV fluids see below.  She will also receive 10 mg compazine for nausea.  Instructed her to push fluids.  Increased urinary frequency: We will check urinalysis and urine culture to rule out UTI.  Plan: Stat labs. (elevated creatinine 1.63) Give 1 L NaCl. Give 10 mg Compazine. RX Diflucan 200 mg daily x10 days. Rx nystatin suspension.  Take 5 ml every 6 hours swish and swallow X 10 days.   Disposition: RTC on Thursday for lab and re-evaluation.  Return to clinic in 2 weeks with labs and follow-up.  Visit Diagnosis 1. Thrush, oral   2. 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[place]   3. Urinary frequency     Patient expressed understanding and was in agreement with this plan. She also understands that She can call clinic at any time with any questions, concerns, or complaints.   Greater than 50% was spent in counseling and coordination of care with this  patient including but not limited to discussion of the relevant topics above (See A&P) including, but not limited to diagnosis and management of acute and chronic medical conditions.   Thank you for allowing me to participate in the care of this very pleasant patient.    JJacquelin Hawking NP CR[facility] LelandCell - [phone]- [phone]/09/2018 12:46 PM

## 2019-02-16 NOTE — Telephone Encounter (Signed)
Spoke to patient feeling poorly poor p.o. intake/nausea plus vomiting.  No fevers or chills.  No dysuria.  Difficulty keeping food down.  Suggestive UTI-start ciprofloxacin/question thrush on Diflucan.  Discussed with Faythe Casa.

## 2019-02-16 NOTE — Progress Notes (Signed)
What are your thoughts?

## 2019-02-16 NOTE — Telephone Encounter (Signed)
Stacy Murillo this has already been handled. Patient already scheduled to come into the clinic this am.  I spoke with husband. Patient is neg. For covid-19 on 9/29. No fevers, no cough/shortness of breath. No constipation/no diarrhea. No fevers.

## 2019-02-17 ENCOUNTER — Inpatient Hospital Stay: Payer: BLUE CROSS/BLUE SHIELD

## 2019-02-17 ENCOUNTER — Telehealth: Payer: Self-pay | Admitting: Internal Medicine

## 2019-02-17 ENCOUNTER — Other Ambulatory Visit: Payer: Self-pay

## 2019-02-17 ENCOUNTER — Inpatient Hospital Stay (HOSPITAL_BASED_OUTPATIENT_CLINIC_OR_DEPARTMENT_OTHER): Payer: BLUE CROSS/BLUE SHIELD | Admitting: Oncology

## 2019-02-17 ENCOUNTER — Encounter: Payer: Self-pay | Admitting: Oncology

## 2019-02-17 ENCOUNTER — Other Ambulatory Visit: Payer: Self-pay | Admitting: Oncology

## 2019-02-17 ENCOUNTER — Telehealth: Payer: Self-pay | Admitting: Oncology

## 2019-02-17 ENCOUNTER — Ambulatory Visit
Admission: RE | Admit: 2019-02-17 | Discharge: 2019-02-17 | Disposition: A | Discharge status: Home / Self Care | Payer: BLUE CROSS/BLUE SHIELD | Source: Ambulatory Visit | Attending: Oncology | Admitting: Oncology

## 2019-02-17 VITALS — BP 134/72 | HR 65 | Temp 97.7°F | Resp 16

## 2019-02-17 DIAGNOSIS — R7989 Other specified abnormal findings of blood chemistry: Secondary | ICD-10-CM

## 2019-02-17 DIAGNOSIS — R112 Nausea with vomiting, unspecified: Secondary | ICD-10-CM

## 2019-02-17 DIAGNOSIS — C8583 Other specified types of non-Hodgkin lymphoma, intra-abdominal lymph nodes: Secondary | ICD-10-CM

## 2019-02-17 DIAGNOSIS — C8338 Diffuse large B-cell lymphoma, lymph nodes of multiple sites: Secondary | ICD-10-CM | POA: Diagnosis not present

## 2019-02-17 DIAGNOSIS — R350 Frequency of micturition: Secondary | ICD-10-CM

## 2019-02-17 LAB — COMPREHENSIVE METABOLIC PANEL
ALT: 21 U/L (ref 0–44)
AST: 16 U/L (ref 15–41)
Albumin: 4.4 g/dL (ref 3.5–5.0)
Alkaline Phosphatase: 75 U/L (ref 38–126)
Anion gap: 7 (ref 5–15)
BUN: 14 mg/dL (ref 8–23)
CO2: 25 mmol/L (ref 22–32)
Calcium: 9.3 mg/dL (ref 8.9–10.3)
Chloride: 103 mmol/L (ref 98–111)
Creatinine, Ser: 1.23 mg/dL — ABNORMAL HIGH (ref 0.44–1.00)
GFR calc Af Amer: 54 mL/min — ABNORMAL LOW (ref >60)
GFR calc non Af Amer: 46 mL/min — ABNORMAL LOW (ref >60)
Glucose, Bld: 123 mg/dL — ABNORMAL HIGH (ref 70–99)
Potassium: 3.3 mmol/L — ABNORMAL LOW (ref 3.5–5.1)
Sodium: 135 mmol/L (ref 135–145)
Total Bilirubin: 0.9 mg/dL (ref 0.3–1.2)
Total Protein: 6.4 g/dL — ABNORMAL LOW (ref 6.5–8.1)

## 2019-02-17 LAB — URINE CULTURE: Culture: <10000 — AB

## 2019-02-17 MED ORDER — SODIUM CHLORIDE 0.9 % IV SOLN
Freq: Once | INTRAVENOUS | Status: AC
  Administered 2019-02-17: 12:00:00 via INTRAVENOUS
  Filled 2019-02-17: qty 250

## 2019-02-17 MED ORDER — PROCHLORPERAZINE EDISYLATE 10 MG/2ML IJ SOLN
10.0000 mg | Freq: Once | INTRAMUSCULAR | Status: AC
  Administered 2019-02-17: 10 mg via INTRAVENOUS
  Filled 2019-02-17: qty 2

## 2019-02-17 MED ORDER — GADOBUTROL 1 MMOL/ML IV SOLN
7.0000 mL | Freq: Once | INTRAVENOUS | Status: AC | PRN
  Administered 2019-02-17: 7 mL via INTRAVENOUS

## 2019-02-17 NOTE — Progress Notes (Signed)
Call patient to inform her of her urinalysis results.  Urinalysis reveals moderate amount of blood, ketones, moderate leukocytes and protein in her urine.  Consulted with Dr. per Monday who recommends treatment for urinary tract infection.  Rx ciprofloxacin 500 mg bid x7 days sent to her pharmacy.  She also began treatment yesterday for possible oral candidiasis.  Nausea and vomiting is not any better.  States every time she eats the food "hits the middle of her esophagus and comes right back up".  This morning she attempted to eat a bagel which resulted in immediate vomiting.  Have consulted with Dr. Gildardo Griffes who recommends bringing the patient in for further evaluation. She is agreeable.   Faythe Casa, NP 02/17/2019 10:08 AM

## 2019-02-17 NOTE — Telephone Encounter (Signed)
I spoke to patient's husband-the results of the MRI brain concerning for malignancy [recurrent lymphoma versus glioma].  Recommend urgent evaluation-at [facility] emergency room.  Recommend calling Dr. Autumn Patty office in the morning tomorrow.  Also recommend taking the patient in the emergency room at [place] AM.   I will also plan to reach out [facility]/BMT in the morning tomorrow.  Discussed with Dr. Mike Gip also.

## 2019-02-17 NOTE — Telephone Encounter (Signed)
Call patient to inform her of her urinalysis results.  Urinalysis reveals moderate amount of blood, ketones, moderate leukocytes and protein in her urine.  Consulted with Dr. Rogue Bussing who recommends treatment for urinary tract infection.  Rx ciprofloxacin 500 mg bid x7 days sent to her pharmacy.  She also began treatment yesterday for possible oral candidiasis.  Nausea and vomiting is not any better.  States every time she eats the food "hits the middle of her esophagus and comes right back up".  This morning she attempted to eat a bagel which resulted in immediate vomiting.  Have consulted with Dr. Rogue Bussing who recommends bringing the patient in for further evaluation. She is agreeable.   Faythe Casa, NP 02/17/2019 10:08 AM

## 2019-02-17 NOTE — Progress Notes (Signed)
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 CT- 1-2 CM LN subpectoral/medistinal/ retro-peritoneal/pelvic/ Right inguinal LN 1.6cm- Bx- DLBCL- ABC; myc-POS; FISH gene re-arragement-NEG. PET- MULTIPLE LN/ Bone involvement; R-CHOP x6- CR'# OCT 2017- PET scan- CR; DEC 22nd 2017- BMBx- "atypical large B cells- <1%  [[facility]; II opinion- Dr.Grover- review of path- not concerning for residual lymphoma]; recommend surviellance  # March 26th  2018PET- NED  # Lumbar puncture- difficult-spinal headache. S/p high dose MXT x4.  ------------------------------------------------------------------- # NOV 2018-recurrent diffuse large B-cell lymphoma [right axilla lymph node biopsy proven]; PET- multiple bone lesions; axillary adenopathy; splenomegaly uptake  # Nov 16th 2018- R-; s/p 3 cycles-CR' Auto-Stem cell Transplant date: 07/11/17 [facility]; Dr.Vincent]; May 1st 2019- PET CR.   #August 2019-double vision; secondary to refractive error-prism improved ---------------------------------------------------------- # June 2017-Elevated LFTs- valacylovir/diflucan; resolved.   #LEFT LE CALF DVT-  on eliquis; STOP NOV 2017.   # May 2017- EF- 55-65%  --------------------------------------------------   DIAGNOSIS: Diffuse large B-cell lymphoma  STAGE: 4       ;GOALS: Cure  CURRENT/MOST RECENT THERAPY-autologous stem cell transplant [facility] 28th 2019]-surveillance    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)  DLBCL (diffuse large B cell lymphoma) (HCC)    Interval history-patient presents to [facility] today for worsening nausea and vomiting; mainly with solid foods.  She was evaluated yesterday in symptom management for same complaints.  She was diagnosed with possible oral candidiasis and a urinary tract infection.  She was started on nystatin oral suspension, Diflucan and Cipro 500 mg twice daily.  She has started her nystatin and Diflucan.  Her husband will be picking up her antibiotic this morning.  She continues to tolerate liquids and all of her medications well.  As previously stated, symptoms began shortly after receiving immunizations at [facility] on 02/02/2019.   Called patient this morning to check up on her and her husband is very concerned given she is unable to consume any solid foods.  She attempted to eat a bagel this morning and immediately vomited it up.  She proceeded to have orange Jell-O which she was able to tolerate.  She is taking her antinausea medications intermittently.  Patient also had mechanical fall prior to leaving clinic yesterday stating she tripped over her shoe and fell hitting her head on the ground.  She had some bleeding from her lower lip but otherwise appeared uninjured.  Today she complains of sore bottom lip and neck pain thought to be due to fall.  She denies any other injury.  She denies any fevers, illness, easy bleeding or bruising, chest pain, shortness of breath, abdominal pain or neurological symptoms.  Reviewed notes from Dr. Evette Doffing bone marrow and stem cell transplant physician at [facility] who evaluated patient on 02/02/2019 and states  she  complained of relatively mild increased fatigue and decreased appetite.  He had low concern at that time of disease recurrence but asked that if she did not improve over the next 1 to 2 weeks she should be reevaluated.  ECOG FS:1 - Symptomatic but completely ambulatory  Review of systems- Review of Systems  Constitutional: Positive for malaise/fatigue.  HENT:       White coating on tongue  Gastrointestinal: Positive for nausea and vomiting.  Musculoskeletal: Positive for falls.  Neurological: Positive for dizziness and weakness.     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   Jugular vein occlusion, 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   Leg cramps    Lymphoma ([place]) 09/14/15   Monoclonal B cell lymphoma   Night sweats    Osteoporosis    Overweight(278.02)    Obesity   Palpitations    Shortness of breath dyspnea    Tachycardia    Thrombocytopenia (HCC)      Past Surgical History:  Procedure Laterality Date   ABDOMINAL HYSTERECTOMY  2005   BONE MARROW BIOPSY  09/14/15   CHOLECYSTECTOMY  1997   COLONOSCOPY  07/05/2014   ESOPHAGOGASTRODUODENOSCOPY  07/05/2014   INGUINAL LYMPH NODE BIOPSY Right 10/05/2015   Procedure: INGUINAL LYMPH NODE BIOPSY;  Surgeon: Christene Lye, MD;  Location: [facility] ORS;  Service:  General;  Laterality: Right;   PERIPHERAL VASCULAR CATHETERIZATION N/A 09/27/2015   Procedure: Glori Luis Cath Insertion;  Surgeon: Algernon Huxley, MD;  Location: [facility] LAB;  Service: Cardiovascular;  Laterality: N/A;   PORTA CATH REMOVAL Right 06/03/2017   Procedure: PORTA CATH REMOVAL, with venous thrombectomy;  Surgeon: Algernon Huxley, MD;  Location: [place] [facility] LAB;  Service: Cardiovascular;  Laterality: Right;   PORTACATH PLACEMENT Right     Social History   Socioeconomic History   Marital status: Married    Spouse name: Not on file   Number of children: Not on file   Years of education: Not on file   Highest education level: Not on file  Occupational History   Not on file  Social Needs   Financial resource strain: Not on file   Food insecurity    Worry: Not on file    Inability: Not on file   Transportation needs    Medical: Not on file    Non-medical: Not on file  Tobacco Use   Smoking status: Never Smoker   Smokeless tobacco: Never Used  Substance and Sexual Activity   Alcohol use: No    Alcohol/week: 0.0 standard drinks   Drug use: No   Sexual activity: Yes    Birth control/protection: None  Lifestyle   Physical activity    Days per week: Not on file    Minutes per  session: Not on file   Stress: Not on file  Relationships   Social connections    Talks on phone: Not on file    Gets together: Not on file    Attends religious service: Not on file    Active member of club or organization: Not on file    Attends meetings of clubs or organizations: Not on file    Relationship status: Not on file   Intimate partner violence    Fear of current or ex partner: Not on file    Emotionally abused: Not on file    Physically abused: Not on file    Forced sexual activity: Not on file  Other Topics Concern   Not on file  Social History Narrative   Married   Does not get regular exercise    Family History  Problem Relation Age of Onset    Heart disease Father        CABG x 4   Multiple myeloma Father    Hypertension Mother    Pancreatic cancer Mother    Ulcers Mother    Asthma Mother    Brain cancer Maternal Uncle    Multiple myeloma Maternal Uncle    Diabetes Neg Hx    Breast cancer Neg Hx      Current Outpatient Medications:    acetaminophen (TYLENOL 8 HOUR) 650 MG CR tablet, Take 650 mg by mouth every 8 (eight) hours as needed for pain., Disp: , Rfl:    ciprofloxacin (CIPRO) 500 MG tablet, Take 1 tablet (500 mg total) by mouth 2 (two) times daily., Disp: 14 tablet, Rfl: 0   escitalopram (LEXAPRO) 10 MG tablet, Take 1 tablet (10 mg total) by mouth daily., Disp: 90 tablet, Rfl: 2   fluconazole (DIFLUCAN) 200 MG tablet, Take 1 tablet (200 mg total) by mouth daily., Disp: 10 tablet, Rfl: 0   nystatin (MYCOSTATIN) 100000 UNIT/ML suspension, Take 5 mLs (500,000 Units total) by mouth 4 (four) times daily., Disp: 473 mL, Rfl: 0   ondansetron (ZOFRAN) 4 MG tablet, Take 4 mg by mouth every 8 (eight) hours as needed for nausea or vomiting., Disp: , Rfl:    prochlorperazine (COMPAZINE) 10 MG tablet, Take 10 mg by mouth every 6 (six) hours as needed for nausea or vomiting., Disp: , Rfl:  No current facility-administered medications for this visit.   Facility-Administered Medications Ordered in Other Visits:    0.9 %  sodium chloride infusion, , Intravenous, Continuous, Herring, Orville Govern, NP, Stopped at 10/14/15 1312   methotrexate (50 mg/ml) 6.3 g in sodium chloride 0.9 % 1,000 mL injection, , Intravenous, Once, Brahmanday, Govinda R, MD   sodium chloride flush (NS) 0.9 % injection 10 mL, 10 mL, Intravenous, PRN, Charlaine Dalton R, MD, 10 mL at 10/05/16 1400  Physical exam:  Vitals:   02/17/19 1157  BP: 134/72  Pulse: 65  Resp: 16  Temp: 97.7 F (36.5 C)  TempSrc: Tympanic   Physical Exam Constitutional:      Appearance: Normal appearance.  HENT:     Head: Normocephalic and atraumatic.  Eyes:       Pupils: Pupils are equal, round, and reactive to light.  Neck:     Musculoskeletal: Normal range of motion.  Cardiovascular:     Rate and Rhythm: Normal rate and regular rhythm.     Heart sounds: Normal heart sounds. No murmur.  Pulmonary:     Effort: Pulmonary effort is normal.     Breath sounds: Normal breath  sounds. No wheezing.  Abdominal:     General: Bowel sounds are normal. There is no distension.     Palpations: Abdomen is soft.     Tenderness: There is no abdominal tenderness.  Musculoskeletal: Normal range of motion.  Skin:    General: Skin is warm and dry.     Findings: No rash.     Comments: Lower lip abrasion  Neurological:     Mental Status: She is alert and oriented to person, place, and time.  Psychiatric:        Judgment: Judgment normal.      CMP Latest Ref Rng & Units 02/17/2019  Glucose 70 - 99 mg/dL 123(H)  BUN 8 - 23 mg/dL 14  Creatinine 0.44 - 1.00 mg/dL 1.23(H)  Sodium 135 - 145 mmol/L 135  Potassium 3.5 - 5.1 mmol/L 3.3(L)  Chloride 98 - 111 mmol/L 103  CO2 22 - 32 mmol/L 25  Calcium 8.9 - 10.3 mg/dL 9.3  Total Protein 6.5 - 8.1 g/dL 6.4(L)  Total Bilirubin 0.3 - 1.2 mg/dL 0.9  Alkaline Phos 38 - 126 U/L 75  AST 15 - 41 U/L 16  ALT 0 - 44 U/L 21   CBC Latest Ref Rng & Units 02/16/2019  WBC 4.0 - 10.5 K/uL 8.9  Hemoglobin 12.0 - 15.0 g/dL 13.4  Hematocrit 36.0 - 46.0 % 38.9  Platelets 150 - 400 K/uL 135(L)    No images are attached to the encounter.  No results found.  Assessment and plan- Patient is a 63 y.o. female who presents to [facility] for persistent nausea and vomiting for greater than 2.5 weeks.  Large B-cell lymphoma: Status post autoLogous stem cell transplant in February 2019.  Currently on surveillance.  Followed by [facility] bone marrow transplant team.  Recently had 54-monthimmunizations which included pneumonia, flu, Tdap and hepatitis B.  No clinical evidence of recurrence.  Blood counts have been stable.  Plan is for surveillance  at this time.  Last imaging was a PET scan from May 2019 which is negative for disease.  They discussed additional treatment options including car T-cell therapy if she relapses.  Nausea/vomiting: Unclear etiology.  Previously thought to be due to oral candidiasis and urinary tract infection.  Unfortunately, it is persistent and causing her to become dehydrated and also worrisome for possible metastatic disease to the brain.  Consulted with Dr. BRogue Bussingwho recommends MRI of the brain with and without contrast ASAP.  Dr. BYevette Edwardshad long discussion with husband.   Weight loss: Greater than 10 pound weight loss since August 2020 d/t persistent nausea and vomiting.   Plan: Stat labs. (Creatinine improved 1.23) Vital signs.  Blood pressure stable. Give 1 L NaCl. Give 10 mg Compazine. Stat MRI of the brain.   Disposition: Scheduled for MRI of the brain this evening at 530.  Will call with results. Return to clinic as scheduled on Thursday for reevaluation.  Visit Diagnosis 1. Non-intractable vomiting with nausea, unspecified vomiting type     Patient expressed understanding and was in agreement with this plan. She also understands that She can call clinic at any time with any questions, concerns, or complaints.   Greater than 50% was spent in counseling and coordination of care with this patient including but not limited to discussion of the relevant topics above (See A&P) including, but not limited to diagnosis and management of acute and chronic medical conditions.   Thank you for allowing me to participate in the care of this very  pleasant patient.    Jacquelin Hawking, NP [place] at [facility] Cell - [phone] Pager- [phone] 02/17/2019 2:56 PM

## 2019-02-18 ENCOUNTER — Telehealth: Payer: Self-pay | Admitting: Internal Medicine

## 2019-02-18 MED ORDER — ENOXAPARIN SODIUM 40 MG/0.4ML ~~LOC~~ SOLN
40.00 | SUBCUTANEOUS | Status: DC
Start: 2019-02-19 — End: 2019-02-18

## 2019-02-18 MED ORDER — ONDANSETRON 4 MG PO TBDP
4.00 | ORAL_TABLET | ORAL | Status: DC
Start: ? — End: 2019-02-18

## 2019-02-18 NOTE — Telephone Encounter (Signed)
Spoke with Dr. Evette Doffing BMT at [facility] updated regarding the recent MRI findings-suggestive of recurrent lymphoma.  Spoke to patient's husband regarding my communication with Dr. Evette Doffing.  They are currently in the emergency room at [facility].   FYI- Dr. Derrel Nip

## 2019-02-18 NOTE — Telephone Encounter (Signed)
I have sent email to Dr. Mayford Knife BMT this morning.  Awaiting reply.  Spoke to patient's husband this morning; he is awaiting to connect to BMT department at [facility].   Recommend getting the MRI on a disc/from [facility].  Recommend going to [facility] ER this morning.

## 2019-02-19 ENCOUNTER — Inpatient Hospital Stay: Payer: BLUE CROSS/BLUE SHIELD

## 2019-02-19 ENCOUNTER — Inpatient Hospital Stay: Payer: BLUE CROSS/BLUE SHIELD | Admitting: Oncology

## 2019-02-19 DIAGNOSIS — R112 Nausea with vomiting, unspecified: Secondary | ICD-10-CM | POA: Insufficient documentation

## 2019-02-19 DIAGNOSIS — G939 Disorder of brain, unspecified: Secondary | ICD-10-CM | POA: Insufficient documentation

## 2019-02-22 LAB — CULTURE, BLOOD (ROUTINE X 2)
Culture: NO GROWTH
Culture: NO GROWTH
Special Requests: ADEQUATE
Special Requests: ADEQUATE

## 2019-02-22 MED ORDER — ESCITALOPRAM OXALATE 10 MG PO TABS
10.00 | ORAL_TABLET | ORAL | Status: DC
Start: 2019-03-05 — End: 2019-02-22

## 2019-02-22 MED ORDER — FLUCONAZOLE 200 MG PO TABS
200.00 | ORAL_TABLET | ORAL | Status: DC
Start: 2019-02-25 — End: 2019-02-22

## 2019-02-22 MED ORDER — ACETAMINOPHEN 325 MG PO TABS
650.00 | ORAL_TABLET | ORAL | Status: DC
Start: ? — End: 2019-02-22

## 2019-02-25 MED ORDER — MIDODRINE HCL 5 MG PO TABS
5.00 | ORAL_TABLET | ORAL | Status: DC
Start: 2019-03-04 — End: 2019-02-25

## 2019-02-26 ENCOUNTER — Telehealth: Payer: Self-pay | Admitting: Internal Medicine

## 2019-02-26 ENCOUNTER — Encounter: Payer: Self-pay | Admitting: Internal Medicine

## 2019-02-26 NOTE — Telephone Encounter (Signed)
Spoke to husband/patient- I agree proceeding if Biopsy of brain lesion.as LP x2 neg;. Await appt here; pending results. Aware that I will be out for 2 week; stating this weekend.

## 2019-03-03 ENCOUNTER — Other Ambulatory Visit: Payer: BLUE CROSS/BLUE SHIELD

## 2019-03-03 ENCOUNTER — Encounter: Payer: Self-pay | Admitting: Internal Medicine

## 2019-03-03 ENCOUNTER — Ambulatory Visit: Payer: BLUE CROSS/BLUE SHIELD | Admitting: Internal Medicine

## 2019-03-04 MED ORDER — GENERIC EXTERNAL MEDICATION
Status: DC
Start: ? — End: 2019-03-04

## 2019-03-04 MED ORDER — ONDANSETRON HCL 4 MG/2ML IJ SOLN
4.00 | INTRAMUSCULAR | Status: DC
Start: ? — End: 2019-03-04

## 2019-03-04 MED ORDER — GENERIC EXTERNAL MEDICATION
6.25 | Status: DC
Start: ? — End: 2019-03-04

## 2019-03-04 MED ORDER — SENNOSIDES 8.6 MG PO TABS
2.00 | ORAL_TABLET | ORAL | Status: DC
Start: ? — End: 2019-03-04

## 2019-03-04 MED ORDER — MEPERIDINE HCL 25 MG/ML IJ SOLN
12.50 | INTRAMUSCULAR | Status: DC
Start: ? — End: 2019-03-04

## 2019-03-04 MED ORDER — FAMOTIDINE 20 MG PO TABS
20.00 | ORAL_TABLET | ORAL | Status: DC
Start: 2019-03-04 — End: 2019-03-04

## 2019-03-05 ENCOUNTER — Encounter: Payer: Self-pay | Admitting: Oncology

## 2019-03-05 ENCOUNTER — Other Ambulatory Visit: Payer: Self-pay

## 2019-03-06 ENCOUNTER — Other Ambulatory Visit: Payer: Self-pay | Admitting: *Deleted

## 2019-03-06 ENCOUNTER — Other Ambulatory Visit: Payer: BLUE CROSS/BLUE SHIELD

## 2019-03-06 ENCOUNTER — Telehealth: Payer: Self-pay | Admitting: *Deleted

## 2019-03-06 ENCOUNTER — Inpatient Hospital Stay (HOSPITAL_BASED_OUTPATIENT_CLINIC_OR_DEPARTMENT_OTHER): Payer: BLUE CROSS/BLUE SHIELD | Admitting: Oncology

## 2019-03-06 ENCOUNTER — Other Ambulatory Visit: Payer: Self-pay

## 2019-03-06 VITALS — BP 96/66 | HR 82 | Temp 98.5°F | Ht 62.0 in | Wt 171.0 lb

## 2019-03-06 DIAGNOSIS — Z5111 Encounter for antineoplastic chemotherapy: Secondary | ICD-10-CM

## 2019-03-06 DIAGNOSIS — Z7189 Other specified counseling: Secondary | ICD-10-CM | POA: Diagnosis not present

## 2019-03-06 DIAGNOSIS — C8338 Diffuse large B-cell lymphoma, lymph nodes of multiple sites: Secondary | ICD-10-CM

## 2019-03-06 DIAGNOSIS — C8589 Other specified types of non-Hodgkin lymphoma, extranodal and solid organ sites: Secondary | ICD-10-CM

## 2019-03-06 MED ORDER — DEXAMETHASONE 4 MG PO TABS: 8.0000 mg | ORAL_TABLET | Freq: Two times a day (BID) | ORAL | 0 refills | Status: DC

## 2019-03-06 MED ORDER — PANTOPRAZOLE SODIUM 20 MG PO TBEC: 20.0000 mg | DELAYED_RELEASE_TABLET | Freq: Every day | ORAL | 0 refills | Status: DC

## 2019-03-06 NOTE — Telephone Encounter (Signed)
Stacy Murillo informed of response and he said to let Dr Janese Banks know that they have still not heard from [facility]

## 2019-03-06 NOTE — Telephone Encounter (Signed)
Patient went to get COVID test today, they were not doing any more today. Husband asks what they are to do. Please advise

## 2019-03-06 NOTE — Telephone Encounter (Signed)
I have messaged Dr. Thera Flake and spoke to her as well. [facility] should call them either today or by Monday morning. Please ask them to call us at Monday morning 8.30 am if they dont hear from [facility]

## 2019-03-06 NOTE — Telephone Encounter (Signed)
Tell them that's ok. We will let [facility] know about it

## 2019-03-06 NOTE — Telephone Encounter (Signed)
Stacy Murillo informed to call at 830 Monday if they have not heard from [facility]

## 2019-03-08 DIAGNOSIS — Z7189 Other specified counseling: Secondary | ICD-10-CM | POA: Insufficient documentation

## 2019-03-08 DIAGNOSIS — C8339 Primary central nervous system lymphoma: Secondary | ICD-10-CM | POA: Insufficient documentation

## 2019-03-08 DIAGNOSIS — C8589 Other specified types of non-Hodgkin lymphoma, extranodal and solid organ sites: Secondary | ICD-10-CM | POA: Insufficient documentation

## 2019-03-08 NOTE — Progress Notes (Signed)
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 CT- 1-2 CM LN subpectoral/medistinal/ retro-peritoneal/pelvic/ Right inguinal LN 1.6cm- Bx- DLBCL- ABC; myc-POS; FISH gene re-arragement-NEG. PET- MULTIPLE LN/ Bone involvement; R-CHOP x6- CR'# OCT 2017- PET scan- CR; DEC 22nd 2017- BMBx- "atypical large B cells- <1%  [[facility]; II opinion- Dr.Grover- review of path- not concerning for residual lymphoma]; recommend surviellance  # March 26th  2018PET- NED  # Lumbar puncture- difficult-spinal headache. S/p high dose MXT x4.  ------------------------------------------------------------------- # NOV 2018-recurrent diffuse large B-cell lymphoma [right axilla lymph node biopsy proven]; PET- multiple bone lesions; axillary adenopathy; splenomegaly uptake  # Nov 16th 2018- R-; s/p 3 cycles-CR' Auto-Stem cell Transplant date: 07/11/17 [facility]; Dr.Vincent]; May 1st 2019- PET CR.   #August 2019-double vision; secondary to refractive error-prism improved ---------------------------------------------------------- # June 2017-Elevated LFTs-  valacylovir/diflucan; resolved.   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   # May 2017- EF- 55-65%  --------------------------------------------------   DIAGNOSIS: Diffuse large B-cell lymphoma  STAGE: 4       ;GOALS: Cure  CURRENT/MOST RECENT THERAPY-autologous stem cell transplant [February 28th 2019]-surveillance    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)  DLBCL (diffuse large B cell lymphoma) (HCC)     Interval history- patient was having persistent nausea/vomiting which prompted mri brain. MRI showed large 5 cm corpus callosum mass extending into the frontal lobes. She was admitted to [facility]. LP X2 negative for malignancy. Pet ct negative for systemic recurrence. Bone marrow biopsy negative. Brain biopsy showed dlbcl similar to patients prior lymphoma  Presently patient reports some fatigue. Appetite is fair. Nausea is tolerable. Denies an focal weakness or numbness  ECOG PS- 1 Pain scale- 0   Review of systems- Review of Systems  Constitutional: Positive for malaise/fatigue. Negative for chills, fever and weight loss.  HENT: Negative for congestion, ear discharge and nosebleeds.   Eyes: Negative for blurred vision.  Respiratory: Negative for cough, hemoptysis, sputum production, shortness of breath and wheezing.   Cardiovascular: Negative for chest pain, palpitations, orthopnea and claudication.  Gastrointestinal: Positive for nausea. Negative for abdominal pain, blood in stool, constipation, diarrhea, heartburn, melena and vomiting.  Genitourinary: Negative for dysuria, flank pain, frequency, hematuria and urgency.  Musculoskeletal: Negative for back pain, joint pain and myalgias.  Skin: Negative for rash.  Neurological: Negative for dizziness, tingling, focal weakness, seizures, weakness and headaches.  Endo/Heme/Allergies: Does not bruise/bleed easily.  Psychiatric/Behavioral: Negative for depression and suicidal ideas. The patient does not have  insomnia.       Allergies  Allergen Reactions  . Ondansetron Hcl Other (See Comments)    Severe constipation   . Oxycodone     Biliary colic with opioids s/p cholecystectomy    .  Rituximab Other (See Comments)    Pt reports throat tightness and facial swelling and redness during infusion of Rituxan     Past Medical History:  Diagnosis Date  . Arthritis   . Blood dyscrasia   . Cancer ([place])   . Chest pain, unspecified   . Diverticulosis 07/06/15  . Dysrhythmia    tachycardia  . Esophagitis 07/06/15  . Fatigue   . Gastritis 07/06/15  . GERD (gastroesophageal reflux disease)   . 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 Leg cramps   . 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 Night sweats   . Osteoporosis   . Overweight(278.02)    Obesity  . Palpitations   . Shortness of breath dyspnea   . Tachycardia   . Thrombocytopenia ([place])      Past Surgical History:  Procedure Laterality Date  . ABDOMINAL HYSTERECTOMY  2005  . BONE MARROW BIOPSY  09/14/15  . CHOLECYSTECTOMY  1997  . COLONOSCOPY  07/05/2014  . ESOPHAGOGASTRODUODENOSCOPY  07/05/2014  . INGUINAL LYMPH NODE BIOPSY Right 10/05/2015   Procedure: INGUINAL LYMPH NODE BIOPSY;  Surgeon: Christene Lye, MD;  Location: [facility] ORS;  Service: General;  Laterality: Right;  . PERIPHERAL VASCULAR CATHETERIZATION N/A 09/27/2015   Procedure: Glori Luis Cath Insertion;  Surgeon: Algernon Huxley, MD;  Location: [place] [facility];  Service: Cardiovascular;  Laterality: N/A;  . PORTA CATH REMOVAL Right 06/03/2017   Procedure: PORTA CATH REMOVAL, with venous thrombectomy;  Surgeon: Algernon Huxley, MD;  Location: [place] [facility];  Service: Cardiovascular;  Laterality: Right;  . PORTACATH PLACEMENT Right     Social History   Socioeconomic History  . Marital status: Married    Spouse name: Not on file  . Number of children: Not on file  . Years of education: Not on file  . Highest  education level: Not on file  Occupational History  . Not on file  Social Needs  . Financial resource strain: Not on file  . Food insecurity    Worry: Not on file    Inability: Not on file  . Transportation needs    Medical: Not on file    Non-medical: Not on file  Tobacco Use  . Smoking status: Never Smoker  . Smokeless tobacco: Never Used  Substance and Sexual Activity  . Alcohol use: No    Alcohol/week: 0.0 standard drinks  . Drug use: No  . Sexual activity: Yes    Birth control/protection: None  Lifestyle  . Physical activity    Days per week: Not on file    Minutes per session: Not on file  . Stress: Not on file  Relationships  . Social Herbalist on phone: Not on file    Gets together: Not on file    Attends religious service: Not on file    Active member of club or organization: Not on file    Attends meetings of clubs or organizations: Not on file    Relationship status: Not on file  . Intimate partner violence    Fear of current or ex partner: Not on file    Emotionally abused: Not on file    Physically abused: Not on file    Forced sexual activity: Not on file  Other Topics Concern  . Not on file  Social History Narrative   Married   Does not get regular exercise    Family History  Problem Relation Age of Onset  . Heart disease Father        CABG x 4  . Multiple myeloma Father   . Hypertension Mother   . Pancreatic cancer Mother   . Ulcers Mother   . Asthma Mother   . Brain cancer Maternal Uncle   . Multiple myeloma Maternal Uncle   . Diabetes Neg Hx   . Breast cancer Neg Hx      Current Outpatient Medications:  .  acetaminophen (TYLENOL 8 HOUR) 650 MG CR tablet, Take 650 mg by mouth every 8 (eight) hours as needed for pain., Disp: , Rfl:  .  escitalopram (LEXAPRO) 10 MG tablet, Take 1 tablet (10 mg total) by mouth daily., Disp: 90 tablet, Rfl: 2 .  midodrine (PROAMATINE) 5 MG tablet, Take 1 tablet by mouth 3 (three) times daily.,  Disp: , Rfl:  .  nystatin (MYCOSTATIN) 100000 UNIT/ML suspension, Take 5 mLs (500,000 Units total) by mouth 4 (four) times daily., Disp: 473 mL, Rfl: 0 .  dexamethasone (DECADRON) 4 MG tablet, Take 2 tablets (8 mg total) by mouth 2 (two) times daily with a meal., Disp: 20 tablet, Rfl: 0 .  ondansetron (ZOFRAN) 4 MG tablet, Take 4 mg by mouth every 8 (eight) hours as needed for nausea or vomiting., Disp: , Rfl:  .  pantoprazole (PROTONIX) 20 MG tablet, Take 1 tablet (20 mg total) by mouth daily., Disp: 20 tablet, Rfl: 0 .  prochlorperazine (COMPAZINE) 10 MG tablet, Take 10 mg by mouth every 6 (six) hours as needed for nausea or vomiting., Disp: , Rfl:  No current facility-administered medications for this visit.   Facility-Administered Medications Ordered in Other Visits:  .  0.9 %  sodium chloride infusion, , Intravenous, Continuous, Herring, Orville Govern, NP, Stopped at 10/14/15 1312 .  methotrexate (50 mg/ml) 6.3 g in sodium chloride 0.9 % 1,000 mL injection, , Intravenous, Once, Brahmanday, Govinda R, MD .  sodium chloride flush (NS) 0.9 % injection 10 mL, 10 mL, Intravenous, PRN, Cammie Sickle, MD, 10 mL at 10/05/16 1400  Physical exam:  Vitals:   03/06/19 1314  BP: 96/66  Pulse: 82  Temp: 98.5 F (36.9 C)  TempSrc: Tympanic  Weight: 171 lb (77.6 kg)  Height: 5' 2"  (1.575 m)   Physical Exam Constitutional:      General: She is not in acute distress. HENT:     Head: Normocephalic and atraumatic.     Comments: She is sitting in a wheechair Eyes:     Pupils: Pupils are equal, round, and reactive to light.  Neck:     Musculoskeletal: Normal range of motion.  Cardiovascular:     Rate and Rhythm: Normal rate and regular rhythm.     Heart sounds: Normal heart sounds.  Pulmonary:     Effort: Pulmonary effort is normal.     Breath sounds: Normal breath sounds.  Abdominal:     General: Bowel sounds are normal.     Palpations: Abdomen is soft.  Skin:    General: Skin is warm  and dry.  Neurological:     General: No focal deficit present.     Mental Status: She is alert and oriented to person, place, and time.      CMP Latest Ref Rng & Units 02/17/2019  Glucose 70 - 99 mg/dL 123(H)  BUN 8 - 23 mg/dL 14  Creatinine 0.44 - 1.00 mg/dL 1.23(H)  Sodium 135 - 145 mmol/L 135  Potassium 3.5 - 5.1  mmol/L 3.3(L)  Chloride 98 - 111 mmol/L 103  CO2 22 - 32 mmol/L 25  Calcium 8.9 - 10.3 mg/dL 9.3  Total Protein 6.5 - 8.1 g/dL 6.4(L)  Total Bilirubin 0.3 - 1.2 mg/dL 0.9  Alkaline Phos 38 - 126 U/L 75  AST 15 - 41 U/L 16  ALT 0 - 44 U/L 21   CBC Latest Ref Rng & Units 02/16/2019  WBC 4.0 - 10.5 K/uL 8.9  Hemoglobin 12.0 - 15.0 g/dL 13.4  Hematocrit 36.0 - 46.0 % 38.9  Platelets 150 - 400 K/uL 135(L)    No images are attached to the encounter.  Mr Jeri Cos Wo Contrast  Addendum Date: 02/17/2019   ADDENDUM REPORT: 02/17/2019 18:49 ADDENDUM: These results were called by telephone at the time of interpretation on 02/17/2019 at 6:48 pm to provider Mike Gip MD, who verbally acknowledged these results. Electronically Signed   By: Franchot Gallo M.D.   On: 02/17/2019 18:49   Result Date: 02/17/2019 CLINICAL DATA:  Non intractable nausea vomiting. History of monoclonal B-cell lymphoma 09/14/2015 EXAM: MRI HEAD WITHOUT AND WITH CONTRAST TECHNIQUE: Multiplanar, multiecho pulse sequences of the brain and surrounding structures were obtained without and with intravenous contrast. CONTRAST:  59m GADAVIST GADOBUTROL 1 MMOL/ML IV SOLN COMPARISON:  MRI head 09/26/2015.  CT 09/30/2015 FINDINGS: Brain: Interval development of mass lesion in the anterior corpus callosum extending into the frontal lobes bilaterally. The corpus callosum is enlarged and hyperintense on T2. The mass measures approximately 3.7 x 5.1 cm. No significant enhancement is seen within the mass. Mild facilitated diffusion. No associated hemorrhage. In addition, there is mild diffuse dural enhancement without nodularity.  The dura is mildly thickened. This has progressed since the prior MRI of 09/26/2015 when there was mild dural thickening and enhancement. Negative for acute infarct. Ventricle size normal. No midline shift. Vascular: Normal arterial flow voids Skull and upper cervical spine: Negative Sinuses/Orbits: Mild mucosal edema paranasal sinuses.  Normal orbit Other: None IMPRESSION: Compared with 2017, interval development nonenhancing mass lesion in the anterior corpus callosum extending into the frontal lobes bilaterally. Given history of B-cell lymphoma, this may represent secondary lymphoma of the brain however it is atypical that there is no enhancement. Differential includes glioblastoma which also typically enhances and is necrotic which is not present on the current study. Mild dural thickening and enhancement bilaterally, with progression since 2017. This could be due to secondary lymphoma. CSF sampling suggested. Electronically Signed: By: CFranchot GalloM.D. On: 02/17/2019 18:34     Assessment and plan- Patient is a 63y.o. female with recurrent dlbcl s/p ASCT now with isolated cns relapse  [facility] called me yesterday to discuss patients case as she was notified about the results of patients brain biopsy. 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 Dr. GThera Flakerecommends R-MVP chemotherapy Day1 and day 15- rituxan. Day 2 - high dose methotrexate and vincristine and procarbazine day 7- day 13 every 28 days for up to 6 cycles. Treatment will be offered with potential curative intent.   In Dr. BAletha Halimabsence- I would like patient [facility] and [facility] have already been made for the same. Patient will get admitted for port placement and inpatient chemo on 03/09/19.  Upon Dr. BAletha Halimreturn- he will discuss with patient/ Dr. GThera Flake as  to where she will receive  subsequent treatments- [facility] or [place].  I have explained all this to patient and her husband Joe in detail.   I have prescribed decadron 8 mg BID along with protonix 20 mg daily.Patient is aware she will be receiving a call from [facility] regarding her inpatient admission.further f/u with Dr. Rogue Bussing to be finalized upon his return   Visit Diagnosis 1. Primary CNS lymphoma ([place])   2. Goals of care, counseling/discussion      Dr. Randa Evens, MD, MPH [facility] [phone] 03/08/2019 7:01 PM

## 2019-03-12 ENCOUNTER — Encounter: Payer: Self-pay | Admitting: Internal Medicine

## 2019-03-12 MED ORDER — ESCITALOPRAM OXALATE 10 MG PO TABS
10.00 | ORAL_TABLET | ORAL | Status: DC
Start: 2019-03-18 — End: 2019-03-12

## 2019-03-12 MED ORDER — MIDODRINE HCL 5 MG PO TABS
5.00 | ORAL_TABLET | ORAL | Status: DC
Start: 2019-03-12 — End: 2019-03-12

## 2019-03-12 MED ORDER — CARBOXYMETHYLCELLULOSE SODIUM 0.25 % OP SOLN
1.00 | OPHTHALMIC | Status: DC
Start: 2019-03-17 — End: 2019-03-12

## 2019-03-12 MED ORDER — SODIUM CHLORIDE FLUSH 0.9 % IV SOLN
10.00 | INTRAVENOUS | Status: DC
Start: 2019-03-17 — End: 2019-03-12

## 2019-03-12 MED ORDER — DIPHENHYDRAMINE HCL 50 MG/ML IJ SOLN
25.00 | INTRAMUSCULAR | Status: DC
Start: ? — End: 2019-03-12

## 2019-03-12 MED ORDER — SODIUM CHLORIDE 0.9 % IV SOLN
20.00 | INTRAVENOUS | Status: DC
Start: ? — End: 2019-03-12

## 2019-03-12 MED ORDER — GENERIC EXTERNAL MEDICATION
1.00 | Status: DC
Start: 2019-03-12 — End: 2019-03-12

## 2019-03-12 MED ORDER — PROCARBAZINE HCL 50 MG PO CAPS
100.00 | ORAL_CAPSULE | ORAL | Status: DC
Start: 2019-03-13 — End: 2019-03-12

## 2019-03-12 MED ORDER — LUBRIDERM DAILY MOISTURE EX LOTN
1.00 | TOPICAL_LOTION | CUTANEOUS | Status: DC
Start: ? — End: 2019-03-12

## 2019-03-12 MED ORDER — DOCUSATE SODIUM 100 MG PO CAPS
100.00 | ORAL_CAPSULE | ORAL | Status: DC
Start: 2019-03-13 — End: 2019-03-12

## 2019-03-12 MED ORDER — LOPERAMIDE HCL 2 MG PO CAPS
4.00 | ORAL_CAPSULE | ORAL | Status: DC
Start: ? — End: 2019-03-12

## 2019-03-12 MED ORDER — ENOXAPARIN SODIUM 40 MG/0.4ML ~~LOC~~ SOLN
40.00 | SUBCUTANEOUS | Status: DC
Start: 2019-03-12 — End: 2019-03-12

## 2019-03-12 MED ORDER — GENERIC EXTERNAL MEDICATION
10.00 | Status: DC
Start: ? — End: 2019-03-12

## 2019-03-12 MED ORDER — PROCHLORPERAZINE MALEATE 10 MG PO TABS
10.00 | ORAL_TABLET | ORAL | Status: DC
Start: ? — End: 2019-03-12

## 2019-03-12 MED ORDER — EPINEPHRINE 0.3 MG/0.3ML IJ SOAJ
0.30 | INTRAMUSCULAR | Status: DC
Start: ? — End: 2019-03-12

## 2019-03-12 MED ORDER — MEPERIDINE HCL 25 MG/ML IJ SOLN
25.00 | INTRAMUSCULAR | Status: DC
Start: ? — End: 2019-03-12

## 2019-03-12 MED ORDER — ACETAMINOPHEN 325 MG PO TABS
650.00 | ORAL_TABLET | ORAL | Status: DC
Start: ? — End: 2019-03-12

## 2019-03-12 MED ORDER — GENERIC EXTERNAL MEDICATION
150.00 | Status: DC
Start: ? — End: 2019-03-12

## 2019-03-12 MED ORDER — GENERIC EXTERNAL MEDICATION
Status: DC
Start: ? — End: 2019-03-12

## 2019-03-12 MED ORDER — ONDANSETRON HCL 8 MG PO TABS
8.00 | ORAL_TABLET | ORAL | Status: DC
Start: 2019-03-13 — End: 2019-03-12

## 2019-03-12 MED ORDER — METHYLPREDNISOLONE SODIUM SUCC 125 MG IJ SOLR
125.00 | INTRAMUSCULAR | Status: DC
Start: ? — End: 2019-03-12

## 2019-03-12 MED ORDER — SENNOSIDES 8.6 MG PO TABS
2.00 | ORAL_TABLET | ORAL | Status: DC
Start: 2019-03-12 — End: 2019-03-12

## 2019-03-12 MED ORDER — LOPERAMIDE HCL 2 MG PO CAPS
2.00 | ORAL_CAPSULE | ORAL | Status: DC
Start: ? — End: 2019-03-12

## 2019-03-12 MED ORDER — POLYETHYLENE GLYCOL 3350 17 G PO PACK
17.00 | PACK | ORAL | Status: DC
Start: 2019-03-12 — End: 2019-03-12

## 2019-03-12 MED ORDER — SODIUM CHLORIDE 0.9 % IV SOLN
1000.00 | INTRAVENOUS | Status: DC
Start: ? — End: 2019-03-12

## 2019-03-12 MED ORDER — LEUCOVORIN CALCIUM 25 MG PO TABS
25.00 | ORAL_TABLET | ORAL | Status: DC
Start: 2019-03-12 — End: 2019-03-12

## 2019-03-12 MED ORDER — DIPHENHYDRAMINE HCL 50 MG/ML IJ SOLN
50.00 | INTRAMUSCULAR | Status: DC
Start: ? — End: 2019-03-12

## 2019-03-12 MED ORDER — FAMOTIDINE 20 MG/2ML IV SOLN
20.00 | INTRAVENOUS | Status: DC
Start: ? — End: 2019-03-12

## 2019-03-16 ENCOUNTER — Encounter: Payer: Self-pay | Admitting: Internal Medicine

## 2019-03-17 ENCOUNTER — Other Ambulatory Visit: Payer: Self-pay | Admitting: *Deleted

## 2019-03-17 ENCOUNTER — Encounter: Payer: Self-pay | Admitting: Internal Medicine

## 2019-03-17 ENCOUNTER — Telehealth: Payer: Self-pay | Admitting: Internal Medicine

## 2019-03-17 DIAGNOSIS — C8338 Diffuse large B-cell lymphoma, lymph nodes of multiple sites: Secondary | ICD-10-CM

## 2019-03-17 MED ORDER — GENERIC EXTERNAL MEDICATION
30.00 | Status: DC
Start: 2019-03-18 — End: 2019-03-17

## 2019-03-17 MED ORDER — VALACYCLOVIR HCL 500 MG PO TABS
500.00 | ORAL_TABLET | ORAL | Status: DC
Start: 2019-03-18 — End: 2019-03-17

## 2019-03-17 MED ORDER — MIDODRINE HCL 5 MG PO TABS
5.00 | ORAL_TABLET | ORAL | Status: DC
Start: 2019-03-17 — End: 2019-03-17

## 2019-03-17 MED ORDER — SALINE NASAL SPRAY 0.65 % NA SOLN
2.00 | NASAL | Status: DC
Start: 2019-03-17 — End: 2019-03-17

## 2019-03-17 MED ORDER — MELATONIN 3 MG PO TABS
3.00 | ORAL_TABLET | ORAL | Status: DC
Start: 2019-03-17 — End: 2019-03-17

## 2019-03-17 MED ORDER — POTASSIUM CHLORIDE 20 MEQ/100ML IV SOLN
20.00 | INTRAVENOUS | Status: DC
Start: ? — End: 2019-03-17

## 2019-03-17 MED ORDER — LEVOFLOXACIN 500 MG PO TABS
500.00 | ORAL_TABLET | ORAL | Status: DC
Start: 2019-03-18 — End: 2019-03-17

## 2019-03-17 MED ORDER — ONDANSETRON 4 MG PO TBDP
4.00 | ORAL_TABLET | ORAL | Status: DC
Start: ? — End: 2019-03-17

## 2019-03-17 MED ORDER — SENNOSIDES 8.6 MG PO TABS
2.00 | ORAL_TABLET | ORAL | Status: DC
Start: 2019-03-17 — End: 2019-03-17

## 2019-03-17 MED ORDER — FAMOTIDINE 20 MG PO TABS
20.00 | ORAL_TABLET | ORAL | Status: DC
Start: 2019-03-18 — End: 2019-03-17

## 2019-03-17 MED ORDER — DEXAMETHASONE 4 MG PO TABS
4.00 | ORAL_TABLET | ORAL | Status: DC
Start: 2019-03-17 — End: 2019-03-17

## 2019-03-17 MED ORDER — DOCUSATE SODIUM 100 MG PO CAPS
100.00 | ORAL_CAPSULE | ORAL | Status: DC
Start: 2019-03-17 — End: 2019-03-17

## 2019-03-17 MED ORDER — ALUM & MAG HYDROXIDE-SIMETH 400-400-40 MG/5ML PO SUSP
30.00 | ORAL | Status: DC
Start: ? — End: 2019-03-17

## 2019-03-17 NOTE — Telephone Encounter (Signed)
Late entry-spoke to patient has been on 11/02.  Patient currently admitted to [facility] awaiting discharge after stabilization of blood pressures.  Husband concerned about post discharge follow-up.   Will reach out to Dr. Thera Flake to discuss patient's plan of care/post discharge follow up.   GB

## 2019-03-18 ENCOUNTER — Inpatient Hospital Stay: Payer: BLUE CROSS/BLUE SHIELD | Admitting: Internal Medicine

## 2019-03-18 ENCOUNTER — Inpatient Hospital Stay: Payer: BLUE CROSS/BLUE SHIELD

## 2019-03-19 ENCOUNTER — Inpatient Hospital Stay: Payer: BLUE CROSS/BLUE SHIELD | Attending: Internal Medicine

## 2019-03-19 ENCOUNTER — Inpatient Hospital Stay: Payer: BLUE CROSS/BLUE SHIELD

## 2019-03-19 ENCOUNTER — Inpatient Hospital Stay (HOSPITAL_BASED_OUTPATIENT_CLINIC_OR_DEPARTMENT_OTHER): Payer: BLUE CROSS/BLUE SHIELD | Admitting: Internal Medicine

## 2019-03-19 ENCOUNTER — Other Ambulatory Visit: Payer: Self-pay

## 2019-03-19 DIAGNOSIS — I951 Orthostatic hypotension: Secondary | ICD-10-CM | POA: Insufficient documentation

## 2019-03-19 DIAGNOSIS — R7989 Other specified abnormal findings of blood chemistry: Secondary | ICD-10-CM | POA: Insufficient documentation

## 2019-03-19 DIAGNOSIS — D72829 Elevated white blood cell count, unspecified: Secondary | ICD-10-CM | POA: Insufficient documentation

## 2019-03-19 DIAGNOSIS — K5900 Constipation, unspecified: Secondary | ICD-10-CM | POA: Diagnosis not present

## 2019-03-19 DIAGNOSIS — C8583 Other specified types of non-Hodgkin lymphoma, intra-abdominal lymph nodes: Secondary | ICD-10-CM

## 2019-03-19 DIAGNOSIS — Z79899 Other long term (current) drug therapy: Secondary | ICD-10-CM | POA: Diagnosis not present

## 2019-03-19 DIAGNOSIS — C8338 Diffuse large B-cell lymphoma, lymph nodes of multiple sites: Secondary | ICD-10-CM | POA: Diagnosis not present

## 2019-03-19 DIAGNOSIS — C8599 Non-Hodgkin lymphoma, unspecified, extranodal and solid organ sites: Secondary | ICD-10-CM | POA: Diagnosis not present

## 2019-03-19 LAB — CBC WITH DIFFERENTIAL/PLATELET
Abs Immature Granulocytes: 1.54 10*3/uL — ABNORMAL HIGH (ref 0.00–0.07)
Basophils Absolute: 0 10*3/uL (ref 0.0–0.1)
Basophils Relative: 0 %
Eosinophils Absolute: 0 10*3/uL (ref 0.0–0.5)
Eosinophils Relative: 0 %
HCT: 31.1 % — ABNORMAL LOW (ref 36.0–46.0)
Hemoglobin: 10.6 g/dL — ABNORMAL LOW (ref 12.0–15.0)
Immature Granulocytes: 5 %
Lymphocytes Relative: 4 %
Lymphs Abs: 1.1 10*3/uL (ref 0.7–4.0)
MCH: 30.9 pg (ref 26.0–34.0)
MCHC: 34.1 g/dL (ref 30.0–36.0)
MCV: 90.7 fL (ref 80.0–100.0)
Monocytes Absolute: 0.4 10*3/uL (ref 0.1–1.0)
Monocytes Relative: 1 %
Neutro Abs: 28.3 10*3/uL — ABNORMAL HIGH (ref 1.7–7.7)
Neutrophils Relative %: 90 %
Platelets: 46 10*3/uL — ABNORMAL LOW (ref 150–400)
RBC: 3.43 MIL/uL — ABNORMAL LOW (ref 3.87–5.11)
RDW: 12.5 % (ref 11.5–15.5)
WBC: 31.4 10*3/uL — ABNORMAL HIGH (ref 4.0–10.5)
nRBC: 0 % (ref 0.0–0.2)

## 2019-03-19 LAB — COMPREHENSIVE METABOLIC PANEL
ALT: 214 U/L — ABNORMAL HIGH (ref 0–44)
AST: 47 U/L — ABNORMAL HIGH (ref 15–41)
Albumin: 4.2 g/dL (ref 3.5–5.0)
Alkaline Phosphatase: 126 U/L (ref 38–126)
Anion gap: 12 (ref 5–15)
BUN: 21 mg/dL (ref 8–23)
CO2: 22 mmol/L (ref 22–32)
Calcium: 9.3 mg/dL (ref 8.9–10.3)
Chloride: 101 mmol/L (ref 98–111)
Creatinine, Ser: 1.31 mg/dL — ABNORMAL HIGH (ref 0.44–1.00)
GFR calc Af Amer: 50 mL/min — ABNORMAL LOW (ref >60)
GFR calc non Af Amer: 43 mL/min — ABNORMAL LOW (ref >60)
Glucose, Bld: 122 mg/dL — ABNORMAL HIGH (ref 70–99)
Potassium: 4 mmol/L (ref 3.5–5.1)
Sodium: 135 mmol/L (ref 135–145)
Total Bilirubin: 1.2 mg/dL (ref 0.3–1.2)
Total Protein: 6.2 g/dL — ABNORMAL LOW (ref 6.5–8.1)

## 2019-03-19 LAB — SAMPLE TO BLOOD BANK

## 2019-03-19 LAB — LACTATE DEHYDROGENASE: LDH: 156 U/L (ref 98–192)

## 2019-03-19 NOTE — Assessment & Plan Note (Addendum)
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  S/p cycle #1 at [facility].  Discussed with Dr. Thera Flake.  Would recommend continued chemotherapy treatments at [facility].  We will be happy to coordinate supportive care/rituximab treatment locally subcu if needed.  # Orthostatic hypotension/labile blood pressures-on midodrine; today systolic blood pressure 90.  Discussed the patient's husband to be aware of the potential falls.  # Constipation: Continue laxatives.  # Slightly elevated creatinine-1.3.  Baseline 1.0 creatinine.  Recommend increased fluid intake.  #Supportive care:  #Granix x4 days [starting 11/04]- Leucocytosis-32.  #Transfusion support:No PRBC;  #Antimicrobial prophylaxis-Levaquin/  # DISPOSITION: # labs- 11/09- cbc/bmp/holdtube/ possible 1 unit platelets [irradiated].  # Follow up with MD- TBD-Dr.B

## 2019-03-19 NOTE — Progress Notes (Signed)
I connected with Stacy Murillo on 03/19/19 at  9:00 AM EST by video enabled telemedicine visit and verified that I am speaking with the correct person using two identifiers.  I discussed the limitations, risks, security and privacy concerns of performing an evaluation and management service by telemedicine and the availability of in-person appointments. I also discussed with the patient that there may be a patient responsible charge related to this service. The patient expressed understanding and agreed to proceed.    Other persons participating in the visit and their role in the encounter: RN/medical reconciliation Patient's location: Office Provider's location: Home  Oncology History Overview Note  #MAY 2017- [place] with intravascular features STAGE IV-  [BMBx- hypercellular- lymphoproliferative process is mostly seen within small vessels in the bone marrow as well as the surrounding interstitium associated with circulating lymphoma cells in the peripheral blood; ]. CT- 1-2 CM LN subpectoral/medistinal/ retro-peritoneal/pelvic/ Right inguinal LN 1.6cm- Bx- DLBCL- ABC; myc-POS; FISH gene re-arragement-NEG. PET- MULTIPLE LN/ Bone involvement; R-CHOP x6- CR'# OCT 2017- PET scan- CR; DEC 22nd 2017- BMBx- "atypical large B cells- <1%  [[facility]; II opinion- Dr.Grover- review of path- not concerning for residual lymphoma]; recommend surviellance  # March 26th  2018PET- NED  # Lumbar puncture- difficult-spinal headache. S/p high dose MXT x4.  ------------------------------------------------------------------- # NOV 2018-recurrent diffuse large B-cell lymphoma [right axilla lymph node biopsy proven]; PET- multiple bone lesions; axillary adenopathy; splenomegaly uptake  # Nov 16th 2018- R-; s/p 3 cycles-CR' Auto-Stem cell Transplant date: 07/11/17 [facility]; Dr.Vincent]; May 1st 2019- PET CR.   # OCT 2020- MASS  spanning the corpus callosum. Brain Biopsy  at [facility]-DLBCL. CD20+. EBV -, Ki67+, Pax5+.  Port placed 10/27, R-MPV started 10/27 [facility]; Dr.Grover]. ---------------------------------------------------------- # June 2017-Elevated LFTs- valacylovir/diflucan; resolved.   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   # May 2017- EF- 55-65%  #August 2019-double vision; secondary to refractive error-prism improved. --------------------------------------------------   DIAGNOSIS: Diffuse large B-cell lymphoma of Brain  STAGE: 4       ;GOALS: Palliative  CURRENT/MOST RECENT THERAPY-R-MPV    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)  DLBCL (diffuse large B cell lymphoma) (HCC)   Chief Complaint: Diffuse large B-cell lymphoma of the brain  History of present illness:Stacy Murillo 63 y.o.  female with history of diffuse large B-cell lymphoma recurrent involving the brain currently on R-MPV is here for follow-up.  Patient is currently s/p cycle #1 R-MPV.  Patient started Neupogen 11/04; today dose #2/of plan for treatments.  As per the husband patient has been doing fairly well.  However continues to have episodes of lightheadedness dizziness especially on standing.  She continues to go around with a wheelchair at home.  Mild constipation.  Otherwise no fevers or chills.  No headaches.  Husband still concerned about lack of improvement of memory.  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  S/p cycle #1 at [facility].  Discussed with Dr. Thera Flake.  Would recommend continued chemotherapy treatments at [facility].  We will be happy to coordinate supportive care/rituximab treatment locally subcu if needed.  # Orthostatic hypotension/labile blood pressures-on midodrine; today systolic blood pressure 90.  Discussed the patient's husband to be aware of the potential falls.  # Constipation: Continue laxatives.  # Slightly elevated  creatinine-1.3.  Baseline 1.0 creatinine.  Recommend increased fluid intake.  #Supportive care:  #Granix x4  days [starting 11/04]- Leucocytosis-32.  #Transfusion support:No PRBC;  #Antimicrobial prophylaxis-Levaquin/  # DISPOSITION: # labs- 11/09- cbc/bmp/holdtube/ possible 1 unit platelets [irradiated].  # Follow up with MD- TBD-Dr.B     Follow-up instructions:  I discussed the assessment and treatment plan with the patient.  The patient was provided an opportunity to ask questions and all were answered.  The patient agreed with the plan and demonstrated understanding of instructions.  The patient was advised to call back or seek an in person evaluation if the symptoms worsen or if the condition fails to improve as anticipated.  Dr. Charlaine Dalton Botines at [facility] 03/19/2019 9:39 AM

## 2019-03-23 ENCOUNTER — Other Ambulatory Visit: Payer: Self-pay

## 2019-03-23 ENCOUNTER — Inpatient Hospital Stay: Payer: BLUE CROSS/BLUE SHIELD

## 2019-03-23 DIAGNOSIS — C8338 Diffuse large B-cell lymphoma, lymph nodes of multiple sites: Secondary | ICD-10-CM | POA: Diagnosis not present

## 2019-03-23 DIAGNOSIS — C8599 Non-Hodgkin lymphoma, unspecified, extranodal and solid organ sites: Secondary | ICD-10-CM

## 2019-03-23 LAB — CBC WITH DIFFERENTIAL/PLATELET
Abs Immature Granulocytes: 0.93 10*3/uL — ABNORMAL HIGH (ref 0.00–0.07)
Basophils Absolute: 0 10*3/uL (ref 0.0–0.1)
Basophils Relative: 0 %
Eosinophils Absolute: 0.1 10*3/uL (ref 0.0–0.5)
Eosinophils Relative: 1 %
HCT: 34 % — ABNORMAL LOW (ref 36.0–46.0)
Hemoglobin: 11.9 g/dL — ABNORMAL LOW (ref 12.0–15.0)
Immature Granulocytes: 9 %
Lymphocytes Relative: 12 %
Lymphs Abs: 1.2 10*3/uL (ref 0.7–4.0)
MCH: 31.8 pg (ref 26.0–34.0)
MCHC: 35 g/dL (ref 30.0–36.0)
MCV: 90.9 fL (ref 80.0–100.0)
Monocytes Absolute: 1.1 10*3/uL — ABNORMAL HIGH (ref 0.1–1.0)
Monocytes Relative: 11 %
Neutro Abs: 7 10*3/uL (ref 1.7–7.7)
Neutrophils Relative %: 67 %
Platelets: 41 10*3/uL — ABNORMAL LOW (ref 150–400)
RBC: 3.74 MIL/uL — ABNORMAL LOW (ref 3.87–5.11)
RDW: 13.5 % (ref 11.5–15.5)
Smear Review: NORMAL
WBC: 10.3 10*3/uL (ref 4.0–10.5)
nRBC: 0.8 % — ABNORMAL HIGH (ref 0.0–0.2)

## 2019-03-23 LAB — BASIC METABOLIC PANEL
Anion gap: 9 (ref 5–15)
BUN: 20 mg/dL (ref 8–23)
CO2: 24 mmol/L (ref 22–32)
Calcium: 9.3 mg/dL (ref 8.9–10.3)
Chloride: 103 mmol/L (ref 98–111)
Creatinine, Ser: 1.38 mg/dL — ABNORMAL HIGH (ref 0.44–1.00)
GFR calc Af Amer: 47 mL/min — ABNORMAL LOW (ref >60)
GFR calc non Af Amer: 40 mL/min — ABNORMAL LOW (ref >60)
Glucose, Bld: 147 mg/dL — ABNORMAL HIGH (ref 70–99)
Potassium: 4.6 mmol/L (ref 3.5–5.1)
Sodium: 136 mmol/L (ref 135–145)

## 2019-03-23 LAB — SAMPLE TO BLOOD BANK

## 2019-04-01 ENCOUNTER — Encounter: Payer: Self-pay | Admitting: *Deleted

## 2019-04-01 ENCOUNTER — Other Ambulatory Visit: Payer: Self-pay | Admitting: *Deleted

## 2019-04-01 ENCOUNTER — Encounter: Payer: Self-pay | Admitting: Internal Medicine

## 2019-04-01 DIAGNOSIS — C8338 Diffuse large B-cell lymphoma, lymph nodes of multiple sites: Secondary | ICD-10-CM

## 2019-04-01 MED ORDER — POTASSIUM CHLORIDE CRYS ER 10 MEQ PO TBCR
40.00 | EXTENDED_RELEASE_TABLET | ORAL | Status: DC
Start: ? — End: 2019-04-01

## 2019-04-01 MED ORDER — POLYETHYLENE GLYCOL 3350 17 G PO PACK
17.00 | PACK | ORAL | Status: DC
Start: 2019-04-02 — End: 2019-04-01

## 2019-04-01 MED ORDER — SODIUM CHLORIDE 0.9 % IV SOLN
20.00 | INTRAVENOUS | Status: DC
Start: ? — End: 2019-04-01

## 2019-04-01 MED ORDER — LOPERAMIDE HCL 2 MG PO CAPS
4.00 | ORAL_CAPSULE | ORAL | Status: DC
Start: ? — End: 2019-04-01

## 2019-04-01 MED ORDER — LUBRIDERM DAILY MOISTURE EX LOTN
1.00 | TOPICAL_LOTION | CUTANEOUS | Status: DC
Start: ? — End: 2019-04-01

## 2019-04-01 MED ORDER — METHYLPREDNISOLONE SODIUM SUCC 125 MG IJ SOLR
125.00 | INTRAMUSCULAR | Status: DC
Start: ? — End: 2019-04-01

## 2019-04-01 MED ORDER — LEUCOVORIN CALCIUM 25 MG PO TABS
25.00 | ORAL_TABLET | ORAL | Status: DC
Start: 2019-04-01 — End: 2019-04-01

## 2019-04-01 MED ORDER — GENERIC EXTERNAL MEDICATION
Status: DC
Start: ? — End: 2019-04-01

## 2019-04-01 MED ORDER — POLYETHYLENE GLYCOL 3350 17 G PO PACK
17.00 | PACK | ORAL | Status: DC
Start: ? — End: 2019-04-01

## 2019-04-01 MED ORDER — FAMOTIDINE 20 MG PO TABS
20.00 | ORAL_TABLET | ORAL | Status: DC
Start: 2019-04-02 — End: 2019-04-01

## 2019-04-01 MED ORDER — PROCHLORPERAZINE MALEATE 10 MG PO TABS
10.00 | ORAL_TABLET | ORAL | Status: DC
Start: ? — End: 2019-04-01

## 2019-04-01 MED ORDER — LOPERAMIDE HCL 2 MG PO CAPS
2.00 | ORAL_CAPSULE | ORAL | Status: DC
Start: ? — End: 2019-04-01

## 2019-04-01 MED ORDER — ALTEPLASE 2 MG IJ SOLR
2.00 | INTRAMUSCULAR | Status: DC
Start: ? — End: 2019-04-01

## 2019-04-01 MED ORDER — SODIUM CHLORIDE FLUSH 0.9 % IV SOLN
10.00 | INTRAVENOUS | Status: DC
Start: 2019-04-01 — End: 2019-04-01

## 2019-04-01 MED ORDER — GENERIC EXTERNAL MEDICATION
10.00 | Status: DC
Start: ? — End: 2019-04-01

## 2019-04-01 MED ORDER — DIPHENHYDRAMINE HCL 50 MG/ML IJ SOLN
25.00 | INTRAMUSCULAR | Status: DC
Start: ? — End: 2019-04-01

## 2019-04-01 MED ORDER — SENNOSIDES 8.6 MG PO TABS
2.00 | ORAL_TABLET | ORAL | Status: DC
Start: 2019-04-01 — End: 2019-04-01

## 2019-04-01 MED ORDER — DEXAMETHASONE 2 MG PO TABS
2.00 | ORAL_TABLET | ORAL | Status: DC
Start: 2019-04-01 — End: 2019-04-01

## 2019-04-01 MED ORDER — MEPERIDINE HCL 25 MG/ML IJ SOLN
25.00 | INTRAMUSCULAR | Status: DC
Start: ? — End: 2019-04-01

## 2019-04-01 MED ORDER — ESCITALOPRAM OXALATE 10 MG PO TABS
10.00 | ORAL_TABLET | ORAL | Status: DC
Start: 2019-04-02 — End: 2019-04-01

## 2019-04-01 MED ORDER — FAMOTIDINE 20 MG/2ML IV SOLN
20.00 | INTRAVENOUS | Status: DC
Start: ? — End: 2019-04-01

## 2019-04-01 MED ORDER — EPINEPHRINE 0.3 MG/0.3ML IJ SOAJ
0.30 | INTRAMUSCULAR | Status: DC
Start: ? — End: 2019-04-01

## 2019-04-01 MED ORDER — GENERIC EXTERNAL MEDICATION
150.00 | Status: DC
Start: ? — End: 2019-04-01

## 2019-04-01 MED ORDER — POTASSIUM CHLORIDE CRYS ER 20 MEQ PO TBCR
60.00 | EXTENDED_RELEASE_TABLET | ORAL | Status: DC
Start: ? — End: 2019-04-01

## 2019-04-01 MED ORDER — SODIUM CHLORIDE 0.9 % IV SOLN
1000.00 | INTRAVENOUS | Status: DC
Start: ? — End: 2019-04-01

## 2019-04-01 MED ORDER — ACETAMINOPHEN 325 MG PO TABS
650.00 | ORAL_TABLET | ORAL | Status: DC
Start: ? — End: 2019-04-01

## 2019-04-01 MED ORDER — VALACYCLOVIR HCL 500 MG PO TABS
500.00 | ORAL_TABLET | ORAL | Status: DC
Start: 2019-04-02 — End: 2019-04-01

## 2019-04-01 MED ORDER — MIDODRINE HCL 5 MG PO TABS
5.00 | ORAL_TABLET | ORAL | Status: DC
Start: 2019-04-01 — End: 2019-04-01

## 2019-04-01 MED ORDER — CARBOXYMETHYLCELLULOSE SODIUM 0.25 % OP SOLN
1.00 | OPHTHALMIC | Status: DC
Start: ? — End: 2019-04-01

## 2019-04-01 MED ORDER — POTASSIUM CHLORIDE CRYS ER 10 MEQ PO TBCR
20.00 | EXTENDED_RELEASE_TABLET | ORAL | Status: DC
Start: ? — End: 2019-04-01

## 2019-04-06 ENCOUNTER — Other Ambulatory Visit: Payer: Self-pay

## 2019-04-06 ENCOUNTER — Inpatient Hospital Stay: Payer: BLUE CROSS/BLUE SHIELD

## 2019-04-06 DIAGNOSIS — C8338 Diffuse large B-cell lymphoma, lymph nodes of multiple sites: Secondary | ICD-10-CM

## 2019-04-06 LAB — CBC WITH DIFFERENTIAL/PLATELET
Abs Immature Granulocytes: 3.2 10*3/uL — ABNORMAL HIGH (ref 0.00–0.07)
Band Neutrophils: 5 %
Basophils Absolute: 0 10*3/uL (ref 0.0–0.1)
Basophils Relative: 0 %
Eosinophils Absolute: 0.4 10*3/uL (ref 0.0–0.5)
Eosinophils Relative: 1 %
HCT: 31.4 % — ABNORMAL LOW (ref 36.0–46.0)
Hemoglobin: 10.6 g/dL — ABNORMAL LOW (ref 12.0–15.0)
Lymphocytes Relative: 9 %
Lymphs Abs: 3.2 10*3/uL (ref 0.7–4.0)
MCH: 32.5 pg (ref 26.0–34.0)
MCHC: 33.8 g/dL (ref 30.0–36.0)
MCV: 96.3 fL (ref 80.0–100.0)
Metamyelocytes Relative: 2 %
Monocytes Absolute: 2.8 10*3/uL — ABNORMAL HIGH (ref 0.1–1.0)
Monocytes Relative: 8 %
Myelocytes: 4 %
Neutro Abs: 25.6 10*3/uL — ABNORMAL HIGH (ref 1.7–7.7)
Neutrophils Relative %: 68 %
Platelets: 94 10*3/uL — ABNORMAL LOW (ref 150–400)
Promyelocytes Relative: 3 %
RBC: 3.26 MIL/uL — ABNORMAL LOW (ref 3.87–5.11)
RDW: 17 % — ABNORMAL HIGH (ref 11.5–15.5)
Smear Review: NORMAL
WBC: 35 10*3/uL — ABNORMAL HIGH (ref 4.0–10.5)
nRBC: 1.1 % — ABNORMAL HIGH (ref 0.0–0.2)

## 2019-04-06 LAB — COMPREHENSIVE METABOLIC PANEL
ALT: 26 U/L (ref 0–44)
AST: 23 U/L (ref 15–41)
Albumin: 4 g/dL (ref 3.5–5.0)
Alkaline Phosphatase: 141 U/L — ABNORMAL HIGH (ref 38–126)
Anion gap: 9 (ref 5–15)
BUN: 26 mg/dL — ABNORMAL HIGH (ref 8–23)
CO2: 22 mmol/L (ref 22–32)
Calcium: 8.9 mg/dL (ref 8.9–10.3)
Chloride: 102 mmol/L (ref 98–111)
Creatinine, Ser: 1.33 mg/dL — ABNORMAL HIGH (ref 0.44–1.00)
GFR calc Af Amer: 49 mL/min — ABNORMAL LOW (ref >60)
GFR calc non Af Amer: 42 mL/min — ABNORMAL LOW (ref >60)
Glucose, Bld: 116 mg/dL — ABNORMAL HIGH (ref 70–99)
Potassium: 4.4 mmol/L (ref 3.5–5.1)
Sodium: 133 mmol/L — ABNORMAL LOW (ref 135–145)
Total Bilirubin: 0.8 mg/dL (ref 0.3–1.2)
Total Protein: 6.1 g/dL — ABNORMAL LOW (ref 6.5–8.1)

## 2019-04-06 LAB — PHOSPHORUS: Phosphorus: 3.4 mg/dL (ref 2.5–4.6)

## 2019-04-06 LAB — MAGNESIUM: Magnesium: 2.1 mg/dL (ref 1.7–2.4)

## 2019-04-07 ENCOUNTER — Telehealth: Payer: Self-pay | Admitting: Internal Medicine

## 2019-04-07 NOTE — Telephone Encounter (Signed)
Spoke to patient/husband regarding recent blood work; patient asymptomatic.  Continue follow-up with [facility] as planned tomorrow-subcu Rituxan/Covid testing.

## 2019-04-08 LAB — NOVEL CORONAVIRUS, NAA: SARS-CoV-2, NAA: NOT DETECTED

## 2019-04-14 ENCOUNTER — Encounter: Payer: Self-pay | Admitting: Internal Medicine

## 2019-04-14 MED ORDER — CVS LORATADINE-D 24 HOUR 10-240 MG PO TB24
25.00 | ORAL_TABLET | ORAL | Status: DC
Start: 2019-04-14 — End: 2019-04-14

## 2019-04-14 MED ORDER — BARIJODEEL PO
10.00 | ORAL | Status: DC
Start: 2019-04-14 — End: 2019-04-14

## 2019-04-14 MED ORDER — ANTIHISTAMINE DECONGESTANT 6-120 MG OR TB12
0.30 | ORAL_TABLET | ORAL | Status: DC
Start: ? — End: 2019-04-14

## 2019-04-14 MED ORDER — MELLARIL 150 MG OR TABS
125.00 | ORAL_TABLET | ORAL | Status: DC
Start: ? — End: 2019-04-14

## 2019-04-14 MED ORDER — LOSARTAN POTASSIUM (ANGIOTENSIN II RECEPTOR ANTAGONISTS)
20.00 | Status: DC
Start: ? — End: 2019-04-14

## 2019-04-14 MED ORDER — PEDIASURE 1.0 CAL/FIBER PO LIQD
2.00 | ORAL | Status: DC
Start: ? — End: 2019-04-14

## 2019-04-14 MED ORDER — LEXIVA PO
1.00 | ORAL | Status: DC
Start: ? — End: 2019-04-14

## 2019-04-14 MED ORDER — Medication
7.50 | Status: DC
Start: 2019-04-15 — End: 2019-04-14

## 2019-04-14 MED ORDER — Medication
8.00 | Status: DC
Start: 2019-04-15 — End: 2019-04-14

## 2019-04-14 MED ORDER — PRO HERBS ENERGY PO TABS
20.00 | ORAL_TABLET | ORAL | Status: DC
Start: 2019-04-15 — End: 2019-04-14

## 2019-04-14 MED ORDER — CROSS-LINKED HYALURONATE
1.00 | Status: DC
Start: 2019-04-14 — End: 2019-04-14

## 2019-04-14 MED ORDER — DIAZEPAM 2.5 MG RE MISC
2.00 | RECTAL | Status: DC
Start: 2019-04-14 — End: 2019-04-14

## 2019-04-14 MED ORDER — TANNIC-12 60-5-10 MG OR TABS
25.00 | ORAL_TABLET | ORAL | Status: DC
Start: ? — End: 2019-04-14

## 2019-04-14 MED ORDER — Medication
Status: DC
Start: ? — End: 2019-04-14

## 2019-04-14 MED ORDER — QUINERVA 260 MG PO TABS
650.00 | ORAL_TABLET | ORAL | Status: DC
Start: ? — End: 2019-04-14

## 2019-04-14 MED ORDER — AMMONUL 10-10 % IV SOLN
5.00 | INTRAVENOUS | Status: DC
Start: ? — End: 2019-04-14

## 2019-04-14 MED ORDER — PEDIASURE 1.0 CAL/FIBER PO LIQD
4.00 | ORAL | Status: DC
Start: ? — End: 2019-04-14

## 2019-04-14 MED ORDER — BIO T PRES PO
100.00 | ORAL | Status: DC
Start: 2019-04-15 — End: 2019-04-14

## 2019-04-14 MED ORDER — Medication
10.00 | Status: DC
Start: ? — End: 2019-04-14

## 2019-04-14 MED ORDER — BARO-CAT PO
1000.00 | ORAL | Status: DC
Start: ? — End: 2019-04-14

## 2019-04-14 MED ORDER — EXCEDRIN QUICKTABS 500-65 MG PO TBDP
10.00 | ORAL_TABLET | ORAL | Status: DC
Start: ? — End: 2019-04-14

## 2019-04-14 MED ORDER — MAVIK 1 MG PO TABS
2.00 | ORAL_TABLET | ORAL | Status: DC
Start: 2019-04-15 — End: 2019-04-14

## 2019-04-14 MED ORDER — BARO-CAT PO
20.00 | ORAL | Status: DC
Start: ? — End: 2019-04-14

## 2019-04-14 MED ORDER — Medication
150.00 | Status: DC
Start: ? — End: 2019-04-14

## 2019-04-14 MED ORDER — SB BRONCHIAL 12.5-200 MG PO TABS
25.00 | ORAL_TABLET | ORAL | Status: DC
Start: ? — End: 2019-04-14

## 2019-04-14 MED ORDER — GRANDPAS INDIAN CORN SOAP EX
500.00 | CUTANEOUS | Status: DC
Start: 2019-04-15 — End: 2019-04-14

## 2019-04-14 MED ORDER — METHOTREXATE SODIUM LPF 25 MG/ML IJ SOLN
1.00 | INTRAMUSCULAR | Status: DC
Start: ? — End: 2019-04-14

## 2019-04-14 MED ORDER — Medication
10.00 | Status: DC
Start: 2019-04-15 — End: 2019-04-14

## 2019-04-15 ENCOUNTER — Encounter: Payer: Self-pay | Admitting: Internal Medicine

## 2019-04-15 ENCOUNTER — Telehealth: Payer: Self-pay | Admitting: *Deleted

## 2019-04-15 DIAGNOSIS — C8338 Diffuse large B-cell lymphoma, lymph nodes of multiple sites: Secondary | ICD-10-CM

## 2019-04-15 NOTE — Telephone Encounter (Signed)
Patient being d/c from [facility]. Needs additional lab apts. Spoke with Dr. Jacinto Reap - v/o to  "order- 12/04 cbc/bmp; possible 1unit platlet transfusion; 12/07- MD; cbc/holdtube/possible 1 unit platelets/PRBC- will need irradiated products."

## 2019-04-15 NOTE — Telephone Encounter (Signed)
apts made - orders entered.

## 2019-04-16 ENCOUNTER — Encounter: Payer: Self-pay | Admitting: Internal Medicine

## 2019-04-17 ENCOUNTER — Inpatient Hospital Stay (HOSPITAL_BASED_OUTPATIENT_CLINIC_OR_DEPARTMENT_OTHER): Payer: BLUE CROSS/BLUE SHIELD | Admitting: Oncology

## 2019-04-17 ENCOUNTER — Inpatient Hospital Stay: Payer: BLUE CROSS/BLUE SHIELD

## 2019-04-17 ENCOUNTER — Other Ambulatory Visit: Payer: Self-pay

## 2019-04-17 ENCOUNTER — Inpatient Hospital Stay: Payer: BLUE CROSS/BLUE SHIELD | Attending: Internal Medicine

## 2019-04-17 DIAGNOSIS — R42 Dizziness and giddiness: Secondary | ICD-10-CM

## 2019-04-17 DIAGNOSIS — I951 Orthostatic hypotension: Secondary | ICD-10-CM | POA: Diagnosis not present

## 2019-04-17 DIAGNOSIS — Z5111 Encounter for antineoplastic chemotherapy: Secondary | ICD-10-CM | POA: Diagnosis not present

## 2019-04-17 DIAGNOSIS — Z79899 Other long term (current) drug therapy: Secondary | ICD-10-CM | POA: Diagnosis not present

## 2019-04-17 DIAGNOSIS — C8338 Diffuse large B-cell lymphoma, lymph nodes of multiple sites: Secondary | ICD-10-CM | POA: Diagnosis not present

## 2019-04-17 DIAGNOSIS — M1990 Unspecified osteoarthritis, unspecified site: Secondary | ICD-10-CM | POA: Diagnosis not present

## 2019-04-17 DIAGNOSIS — D72829 Elevated white blood cell count, unspecified: Secondary | ICD-10-CM | POA: Diagnosis not present

## 2019-04-17 DIAGNOSIS — K219 Gastro-esophageal reflux disease without esophagitis: Secondary | ICD-10-CM | POA: Insufficient documentation

## 2019-04-17 DIAGNOSIS — N183 Chronic kidney disease, stage 3 unspecified: Secondary | ICD-10-CM | POA: Insufficient documentation

## 2019-04-17 LAB — COMPREHENSIVE METABOLIC PANEL
ALT: 51 U/L — ABNORMAL HIGH (ref 0–44)
AST: 24 U/L (ref 15–41)
Albumin: 3.5 g/dL (ref 3.5–5.0)
Alkaline Phosphatase: 56 U/L (ref 38–126)
Anion gap: 6 (ref 5–15)
BUN: 24 mg/dL — ABNORMAL HIGH (ref 8–23)
CO2: 22 mmol/L (ref 22–32)
Calcium: 8 mg/dL — ABNORMAL LOW (ref 8.9–10.3)
Chloride: 103 mmol/L (ref 98–111)
Creatinine, Ser: 1.49 mg/dL — ABNORMAL HIGH (ref 0.44–1.00)
GFR calc Af Amer: 43 mL/min — ABNORMAL LOW (ref >60)
GFR calc non Af Amer: 37 mL/min — ABNORMAL LOW (ref >60)
Glucose, Bld: 105 mg/dL — ABNORMAL HIGH (ref 70–99)
Potassium: 4.4 mmol/L (ref 3.5–5.1)
Sodium: 131 mmol/L — ABNORMAL LOW (ref 135–145)
Total Bilirubin: 1 mg/dL (ref 0.3–1.2)
Total Protein: 5.2 g/dL — ABNORMAL LOW (ref 6.5–8.1)

## 2019-04-17 LAB — CBC WITH DIFFERENTIAL/PLATELET
Abs Immature Granulocytes: 0.1 10*3/uL — ABNORMAL HIGH (ref 0.00–0.07)
Basophils Absolute: 0 10*3/uL (ref 0.0–0.1)
Basophils Relative: 0 %
Eosinophils Absolute: 0 10*3/uL (ref 0.0–0.5)
Eosinophils Relative: 0 %
HCT: 27.6 % — ABNORMAL LOW (ref 36.0–46.0)
Hemoglobin: 9.5 g/dL — ABNORMAL LOW (ref 12.0–15.0)
Immature Granulocytes: 1 %
Lymphocytes Relative: 14 %
Lymphs Abs: 1.3 10*3/uL (ref 0.7–4.0)
MCH: 33.3 pg (ref 26.0–34.0)
MCHC: 34.4 g/dL (ref 30.0–36.0)
MCV: 96.8 fL (ref 80.0–100.0)
Monocytes Absolute: 0.2 10*3/uL (ref 0.1–1.0)
Monocytes Relative: 2 %
Neutro Abs: 8 10*3/uL — ABNORMAL HIGH (ref 1.7–7.7)
Neutrophils Relative %: 83 %
Platelets: 94 10*3/uL — ABNORMAL LOW (ref 150–400)
RBC: 2.85 MIL/uL — ABNORMAL LOW (ref 3.87–5.11)
RDW: 17.8 % — ABNORMAL HIGH (ref 11.5–15.5)
WBC: 9.6 10*3/uL (ref 4.0–10.5)
nRBC: 0 % (ref 0.0–0.2)

## 2019-04-17 LAB — SAMPLE TO BLOOD BANK

## 2019-04-17 MED ORDER — HEPARIN SOD (PORK) LOCK FLUSH 100 UNIT/ML IV SOLN
500.0000 [IU] | Freq: Once | INTRAVENOUS | Status: AC
  Administered 2019-04-17: 500 [IU] via INTRAVENOUS
  Filled 2019-04-17: qty 5

## 2019-04-17 NOTE — Progress Notes (Signed)
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 CT- 1-2 CM LN subpectoral/medistinal/ retro-peritoneal/pelvic/ Right inguinal LN 1.6cm- Bx- DLBCL- ABC; myc-POS; FISH gene re-arragement-NEG. PET- MULTIPLE LN/ Bone involvement; R-CHOP x6- CR'# OCT 2017- PET scan- CR; DEC 22nd 2017- BMBx- "atypical large B cells- <1%  [[facility]; II opinion- Dr.Grover- review of path- not concerning for residual lymphoma]; recommend surviellance  # March 26th  2018PET- NED  # Lumbar puncture- difficult-spinal headache. S/p high dose MXT x4.  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   # OCT 2020- MASS  spanning the corpus callosum. Brain Biopsy  at [facility]-DLBCL. CD20+. EBV -, Ki67+, Pax5+. Port placed 10/27, R-MPV started 10/27 [facility]; Dr.Grover]. ---------------------------------------------------------- # June  2017-Elevated LFTs- valacylovir/diflucan; resolved.   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   # May 2017- EF- 55-65%  #August 2019-double vision; secondary to refractive error-prism improved. 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  She is status post cycle 3 R-MPV odd cycle with procarbazine which was completed on 04/14/2019 at [facility]. Has tolerated chemotherapy well with ongoing orthostatic hypotension.  Currently on midodrine 7.5 3 times daily.  Continues to have significant orthostatic hypotension.  She completed 2 doses of procarbazine on 12/2 and 12/3 for a total of 7 days. Began Neupogen today for 4 days.  Dexamethasone recently tapered to 2 mg twice daily from 4 mg.  Currently on Bactrim for prophylaxis.  Next cycle will begin on 04/24/2019.  Patient states that her dizziness continues to be persistent especially when standing and slightly worse since her last cycle of chemotherapy.  States her head becomes "hot" which forces her to sit down or lay down.  Symptoms resolve with rest.  Reports "only wanting to sleep".  She is fairly inactive at home.  She stands only to move to and from rooms and use the bathroom.  She feels tired.  Patient and husband question if this is normal side effects from chemo or if this can be fixed.   She denies any fevers or illness, easy bleeding or bruising, weight loss, chest pain, nausea, vomiting, constipation or diarrhea.  She denies any urinary concerns.  ECOG FS:2 - Symptomatic, <50% confined to bed  Review of systems- Review of Systems  Constitutional: Positive for  malaise/fatigue.  Neurological: Positive for dizziness and weakness.     Current treatment-status post 3 cycles of R-MPV.  Requires hospitalization for each treatment.  Recently discharged on 04/14/2019.  Next treatment scheduled for 04/24/2019 at [facility].  Allergies  Allergen Reactions  . Ondansetron Hcl Other (See Comments)    Severe constipation   . Oxycodone     Biliary colic with opioids s/p cholecystectomy    . Rituximab Other (See Comments)    Pt reports throat tightness and facial swelling and redness during infusion of Rituxan     Past Medical History:  Diagnosis Date  . Arthritis   . Blood dyscrasia   . Cancer ([place])   . Chest pain, unspecified   . Diverticulosis 07/06/15  . Dysrhythmia    tachycardia  . Esophagitis 07/06/15  . Fatigue   . Gastritis 07/06/15  . GERD (gastroesophageal reflux disease)   . 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 Leg cramps   . Lymphoma ([place]) 09/14/15   Monoclonal B cell lymphoma  . Night sweats   . Osteoporosis   . Overweight(278.02)    Obesity  . Palpitations   . Shortness of breath dyspnea   . Tachycardia   . Thrombocytopenia ([place])      Past Surgical History:  Procedure Laterality Date  . ABDOMINAL HYSTERECTOMY  2005  . BONE MARROW BIOPSY  09/14/15  . CHOLECYSTECTOMY  1997  . COLONOSCOPY  07/05/2014  . ESOPHAGOGASTRODUODENOSCOPY  07/05/2014  . INGUINAL LYMPH NODE BIOPSY Right 10/05/2015   Procedure: INGUINAL LYMPH NODE BIOPSY;  Surgeon: Christene Lye, MD;  Location: [facility] ORS;  Service: General;  Laterality: Right;  . PERIPHERAL VASCULAR CATHETERIZATION N/A 09/27/2015   Procedure: Glori Luis Cath Insertion;  Surgeon: Algernon Huxley, MD;  Location: [place] CV LAB;  Service: Cardiovascular;  Laterality: N/A;  . PORTA CATH REMOVAL Right 06/03/2017   Procedure: PORTA CATH REMOVAL, with venous thrombectomy;  Surgeon: Algernon Huxley, MD;  Location: [place] CV LAB;  Service: Cardiovascular;   Laterality: Right;  . PORTACATH PLACEMENT Right     Social History   Socioeconomic History  . Marital status: Married    Spouse name: Not on file  . Number of children: Not on file  . Years of education: Not on file  . Highest education level: Not on file  Occupational History  . Not on file  Tobacco Use  . Smoking status: Never Smoker  . Smokeless tobacco: Never Used  Substance and Sexual Activity  . Alcohol use: No    Alcohol/week: 0.0 standard drinks  . Drug use: No  . Sexual activity: Yes    Birth control/protection: None  Other Topics Concern  . Not on file  Social History Narrative   Married   Does not get regular exercise   Social Determinants of Health   Financial Resource Strain:   . Difficulty of Paying Living Expenses: Not on file  Food Insecurity:   . Worried About Charity fundraiser in the Last Year: Not on file  . Ran Out of Food in the Last Year: Not on file  Transportation Needs:   . Lack of Transportation (Medical): Not on file  . Lack of Transportation (Non-Medical): Not on file  Physical Activity:   . Days of Exercise per Week: Not on file  . Minutes of Exercise per Session: Not on file  Stress:   . Feeling of Stress : Not on  file  Social Connections:   . Frequency of Communication with Friends and Family: Not on file  . Frequency of Social Gatherings with Friends and Family: Not on file  . Attends Religious Services: Not on file  . Active Member of Clubs or Organizations: Not on file  . Attends Archivist Meetings: Not on file  . Marital Status: Not on file  Intimate Partner Violence:   . Fear of Current or Ex-Partner: Not on file  . Emotionally Abused: Not on file  . Physically Abused: Not on file  . Sexually Abused: Not on file    Family History  Problem Relation Age of Onset  . Heart disease Father        CABG x 4  . Multiple myeloma Father   . Hypertension Mother   . Pancreatic cancer Mother   . Ulcers Mother   .  Asthma Mother   . Brain cancer Maternal Uncle   . Multiple myeloma Maternal Uncle   . Diabetes Neg Hx   . Breast cancer Neg Hx      Current Outpatient Medications:  .  acetaminophen (TYLENOL 8 HOUR) 650 MG CR tablet, Take 650 mg by mouth every 8 (eight) hours as needed for pain., Disp: , Rfl:  .  Carboxymethylcellulose Sodium (THERATEARS) 0.25 % SOLN, Apply 1 drop to eye every 4 (four) hours as needed for dry eyes., Disp: , Rfl:  .  dexamethasone (DECADRON) 4 MG tablet, Take 1 tablet by mouth every 12 (twelve) hours., Disp: , Rfl:  .  escitalopram (LEXAPRO) 10 MG tablet, Take 1 tablet (10 mg total) by mouth daily., Disp: 90 tablet, Rfl: 2 .  famotidine (PEPCID) 20 MG tablet, Take by mouth., Disp: , Rfl:  .  midodrine (PROAMATINE) 5 MG tablet, Take 1 tablet by mouth 3 (three) times daily., Disp: , Rfl:  .  nystatin (MYCOSTATIN) 100000 UNIT/ML suspension, Take 5 mLs (500,000 Units total) by mouth 4 (four) times daily., Disp: 473 mL, Rfl: 0 .  ondansetron (ZOFRAN) 4 MG tablet, Take 4 mg by mouth every 8 (eight) hours as needed for nausea or vomiting., Disp: , Rfl:  .  ondansetron (ZOFRAN-ODT) 4 MG disintegrating tablet, Take 1 tablet by mouth every 12 (twelve) hours., Disp: , Rfl:  .  procarbazine (MATULANE) 50 MG capsule, Take 4 capsules by mouth daily., Disp: , Rfl:  .  prochlorperazine (COMPAZINE) 10 MG tablet, Take 10 mg by mouth every 6 (six) hours as needed for nausea or vomiting., Disp: , Rfl:  .  sodium chloride (ALTAMIST SPRAY) 0.65 % nasal spray, Place 2 sprays into the nose every 6 (six) hours as needed., Disp: , Rfl:  .  sulfamethoxazole-trimethoprim (BACTRIM) 400-80 MG tablet, 40-80 mg Every Tuesday Thursday and Saturday, Disp: , Rfl:  .  Tbo-Filgrastim (GRANIX) 300 MCG/0.5ML SOSY injection, Inject into the skin., Disp: , Rfl:  .  valACYclovir (VALTREX) 500 MG tablet, Take 1 tablet by mouth daily., Disp: , Rfl:  .  XIIDRA 5 % SOLN, Place 1 drop into both eyes 2 (two) times daily.,  Disp: , Rfl:  No current facility-administered medications for this visit.  Facility-Administered Medications Ordered in Other Visits:  .  0.9 %  sodium chloride infusion, , Intravenous, Continuous, Herring, Orville Govern, NP, Stopped at 10/14/15 1312 .  methotrexate (50 mg/ml) 6.3 g in sodium chloride 0.9 % 1,000 mL injection, , Intravenous, Once, Brahmanday, Govinda R, MD .  sodium chloride flush (NS) 0.9 % injection 10 mL, 10 mL,  Intravenous, PRN, Cammie Sickle, MD, 10 mL at 10/05/16 1400  Physical exam: There were no vitals filed for this visit. Physical Exam Constitutional:      Appearance: Normal appearance.  HENT:     Head: Normocephalic and atraumatic.  Eyes:     Pupils: Pupils are equal, round, and reactive to light.  Cardiovascular:     Rate and Rhythm: Normal rate and regular rhythm.     Heart sounds: Normal heart sounds. No murmur.  Pulmonary:     Effort: Pulmonary effort is normal.     Breath sounds: Normal breath sounds. No wheezing.  Abdominal:     General: Bowel sounds are normal. There is no distension.     Palpations: Abdomen is soft.     Tenderness: There is no abdominal tenderness.  Musculoskeletal:        General: Normal range of motion.     Cervical back: Normal range of motion.  Skin:    General: Skin is warm and dry.     Findings: No rash.  Neurological:     Mental Status: She is alert and oriented to person, place, and time.     Cranial Nerves: Cranial nerves are intact.     Sensory: Sensation is intact.     Motor: Weakness present.  Psychiatric:        Judgment: Judgment normal.      CMP Latest Ref Rng & Units 05/01/2019  Glucose 70 - 99 mg/dL 132(H)  BUN 8 - 23 mg/dL 18  Creatinine 0.44 - 1.00 mg/dL 1.21(H)  Sodium 135 - 145 mmol/L 136  Potassium 3.5 - 5.1 mmol/L 4.4  Chloride 98 - 111 mmol/L 104  CO2 22 - 32 mmol/L 21(L)  Calcium 8.9 - 10.3 mg/dL 9.3  Total Protein 6.5 - 8.1 g/dL -  Total Bilirubin 0.3 - 1.2 mg/dL -  Alkaline Phos 38  - 126 U/L -  AST 15 - 41 U/L -  ALT 0 - 44 U/L -   CBC Latest Ref Rng & Units 05/04/2019  WBC 4.0 - 10.5 K/uL 13.2(H)  Hemoglobin 12.0 - 15.0 g/dL 8.9(L)  Hematocrit 36.0 - 46.0 % 27.8(L)  Platelets 150 - 400 K/uL 48(L)    No images are attached to the encounter.  No results found.  Assessment and plan- Patient is a 63 y.o. female who presents to symptom management for persistent and worsening dizziness, weakness and fatigue.  Large B-cell lymphoma with CNS relapse: Status post [facility] stem cell transplant in February 2019.  Recurrence noted after MRI of brain showed concerns of malignancy.  Had lumbar puncture x2 which was negative.  PET CT negative for systemic recurrence.  Brain biopsy revealed DLBCL similar to prior lymphoma. Has received 3 cycles of R-MPV.  Requires hospitalization for each treatment.  Recently discharged on 04/14/2019.  Next treatment scheduled for 04/24/2019 at [facility].  Dizziness: Likely multifactorial.  Dr. Yevette Edwards assessed patient and spoke with [facility] oncologist. Her Decadron was recently decreased from 4 mg twice daily to 2 mg twice daily.  Possible adrenal insufficiency verse treatment realted.  We will add appropriate labs and hydrate. See plan below.   Plan: Labs. (CBC,CMP, cortisol, ACTH) Continue midodrine 7.5 twice daily. IV fluids in clinic.  Patient notes improvement with Iv fluids.  Continue decreased decadron as prescribed by [facility].   Disposition: RTC a scheduled on Monday to see Dr. Jacinto Reap.  Visit Diagnosis 1. Diffuse large B-cell lymphoma of lymph nodes of multiple regions ([place])   2.  Dizziness     Patient expressed understanding and was in agreement with this plan. She also understands that She can call clinic at any time with any questions, concerns, or complaints.   Greater than 50% was spent in counseling and coordination of care with this patient including but not limited to discussion of the relevant topics above (See A&P) including, but not  limited to diagnosis and management of acute and chronic medical conditions.   Thank you for allowing me to participate in the care of this very pleasant patient.    Jacquelin Hawking, NP [place] [facility] Cell - [phone] Pager- [phone] 05/18/2019 9:09 AM

## 2019-04-19 ENCOUNTER — Other Ambulatory Visit: Payer: Self-pay

## 2019-04-19 DIAGNOSIS — C8338 Diffuse large B-cell lymphoma, lymph nodes of multiple sites: Secondary | ICD-10-CM

## 2019-04-20 ENCOUNTER — Other Ambulatory Visit: Payer: Self-pay | Admitting: Licensed Clinical Social Worker

## 2019-04-20 ENCOUNTER — Other Ambulatory Visit: Payer: Self-pay

## 2019-04-20 ENCOUNTER — Inpatient Hospital Stay: Payer: BLUE CROSS/BLUE SHIELD

## 2019-04-20 ENCOUNTER — Inpatient Hospital Stay (HOSPITAL_BASED_OUTPATIENT_CLINIC_OR_DEPARTMENT_OTHER): Payer: BLUE CROSS/BLUE SHIELD | Admitting: Internal Medicine

## 2019-04-20 DIAGNOSIS — C8338 Diffuse large B-cell lymphoma, lymph nodes of multiple sites: Secondary | ICD-10-CM

## 2019-04-20 DIAGNOSIS — D696 Thrombocytopenia, unspecified: Secondary | ICD-10-CM

## 2019-04-20 DIAGNOSIS — C8599 Non-Hodgkin lymphoma, unspecified, extranodal and solid organ sites: Secondary | ICD-10-CM | POA: Diagnosis not present

## 2019-04-20 LAB — COMPREHENSIVE METABOLIC PANEL
ALT: 30 U/L (ref 0–44)
AST: 18 U/L (ref 15–41)
Albumin: 4.2 g/dL (ref 3.5–5.0)
Alkaline Phosphatase: 89 U/L (ref 38–126)
Anion gap: 9 (ref 5–15)
BUN: 26 mg/dL — ABNORMAL HIGH (ref 8–23)
CO2: 21 mmol/L — ABNORMAL LOW (ref 22–32)
Calcium: 9 mg/dL (ref 8.9–10.3)
Chloride: 103 mmol/L (ref 98–111)
Creatinine, Ser: 1.39 mg/dL — ABNORMAL HIGH (ref 0.44–1.00)
GFR calc Af Amer: 46 mL/min — ABNORMAL LOW (ref >60)
GFR calc non Af Amer: 40 mL/min — ABNORMAL LOW (ref >60)
Glucose, Bld: 143 mg/dL — ABNORMAL HIGH (ref 70–99)
Potassium: 5.3 mmol/L — ABNORMAL HIGH (ref 3.5–5.1)
Sodium: 133 mmol/L — ABNORMAL LOW (ref 135–145)
Total Bilirubin: 1 mg/dL (ref 0.3–1.2)
Total Protein: 6.3 g/dL — ABNORMAL LOW (ref 6.5–8.1)

## 2019-04-20 LAB — CBC WITH DIFFERENTIAL/PLATELET
Abs Immature Granulocytes: 1.3 10*3/uL — ABNORMAL HIGH (ref 0.00–0.07)
Basophils Absolute: 0 10*3/uL (ref 0.0–0.1)
Basophils Relative: 0 %
Eosinophils Absolute: 0 10*3/uL (ref 0.0–0.5)
Eosinophils Relative: 0 %
HCT: 29.2 % — ABNORMAL LOW (ref 36.0–46.0)
Hemoglobin: 9.7 g/dL — ABNORMAL LOW (ref 12.0–15.0)
Lymphocytes Relative: 9 %
Lymphs Abs: 2.3 10*3/uL (ref 0.7–4.0)
MCH: 33.6 pg (ref 26.0–34.0)
MCHC: 33.2 g/dL (ref 30.0–36.0)
MCV: 101 fL — ABNORMAL HIGH (ref 80.0–100.0)
Metamyelocytes Relative: 4 %
Monocytes Absolute: 1 10*3/uL (ref 0.1–1.0)
Monocytes Relative: 4 %
Myelocytes: 1 %
Neutro Abs: 21.1 10*3/uL — ABNORMAL HIGH (ref 1.7–7.7)
Neutrophils Relative %: 81 %
Other: 1 %
Platelets: 73 10*3/uL — ABNORMAL LOW (ref 150–400)
RBC: 2.89 MIL/uL — ABNORMAL LOW (ref 3.87–5.11)
RDW: 20.1 % — ABNORMAL HIGH (ref 11.5–15.5)
Smear Review: NORMAL
WBC: 26.1 10*3/uL — ABNORMAL HIGH (ref 4.0–10.5)
nRBC: 0.8 % — ABNORMAL HIGH (ref 0.0–0.2)

## 2019-04-20 LAB — SAMPLE TO BLOOD BANK

## 2019-04-20 NOTE — Progress Notes (Signed)
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 CT- 1-2 CM LN subpectoral/medistinal/ retro-peritoneal/pelvic/ Right inguinal LN 1.6cm- Bx- DLBCL- ABC; myc-POS; FISH gene re-arragement-NEG. PET- MULTIPLE LN/ Bone involvement; R-CHOP x6- CR'# OCT 2017- PET scan- CR; DEC 22nd 2017- BMBx- "atypical large B cells- <1%  [[facility]; II opinion- Dr.Grover- review of path- not concerning for residual lymphoma]; recommend surviellance  # March 26th  2018PET- NED  # Lumbar puncture- difficult-spinal headache. S/p high dose MXT x4.  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   # OCT 2020- MASS  spanning the corpus callosum. Brain Biopsy  at [facility]-DLBCL. CD20+. EBV -, Ki67+, Pax5+. Port placed 10/27, R-MPV started 10/27 [facility]; Dr.Grover]. ---------------------------------------------------------- # June 2017-Elevated LFTs-  valacylovir/diflucan; resolved.   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   # May 2017- EF- 55-65%  #August 2019-double vision; secondary to refractive error-prism improved. 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  Patient has been diagnosed with severe orthostatic hypotension-question autonomic unclear etiology.  Patient is currently on midodrine.  Patient was evaluated in the symptom management clinic on 12/04-received IV fluids; otherwise no blood products.  Patient continues to feel mildly dizzy when she stands up.  Otherwise no falls.  No vomiting.  No nausea.  No headaches.  Review of Systems  Constitutional: Positive for malaise/fatigue. Negative for chills, diaphoresis, fever and weight loss.  HENT: Negative for nosebleeds and sore throat.   Eyes: Negative for double vision.  Respiratory: Negative for cough, hemoptysis, sputum production and wheezing.   Cardiovascular: Negative for chest pain, orthopnea and leg swelling.  Gastrointestinal: Negative for abdominal pain, blood in stool, constipation, diarrhea, heartburn, melena, nausea and vomiting.  Genitourinary: Negative for dysuria, frequency and urgency.  Musculoskeletal: Negative for back pain and joint pain.  Skin: Negative.  Negative for itching and rash.  Neurological: Positive for dizziness. Negative for tingling, focal weakness and weakness.  Endo/Heme/Allergies: Does not bruise/bleed easily.  Psychiatric/Behavioral: Negative for depression. The patient does not have  insomnia.       PAST MEDICAL HISTORY :  Past Medical History:  Diagnosis Date  . Arthritis   . Blood dyscrasia   . Cancer ([place])   .  Chest pain, unspecified   . Diverticulosis 07/06/15  . Dysrhythmia    tachycardia  . Esophagitis 07/06/15  . Fatigue   . Gastritis 07/06/15  . GERD (gastroesophageal reflux disease)   . 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 Leg cramps   . 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 Night sweats   . Osteoporosis   . Overweight(278.02)    Obesity  . Palpitations   . Shortness of breath dyspnea   . Tachycardia   . Thrombocytopenia ([place])     PAST SURGICAL HISTORY :   Past Surgical History:  Procedure Laterality Date  . ABDOMINAL HYSTERECTOMY  2005  . BONE MARROW BIOPSY  09/14/15  . CHOLECYSTECTOMY  1997  . COLONOSCOPY  07/05/2014  . ESOPHAGOGASTRODUODENOSCOPY  07/05/2014  . INGUINAL LYMPH NODE BIOPSY Right 10/05/2015   Procedure: INGUINAL LYMPH NODE BIOPSY;  Surgeon: Christene Lye, MD;  Location: [facility] ORS;  Service: General;  Laterality: Right;  . PERIPHERAL VASCULAR CATHETERIZATION N/A 09/27/2015   Procedure: Glori Luis Cath Insertion;  Surgeon: Algernon Huxley, MD;  Location: [place] CV LAB;  Service: Cardiovascular;  Laterality: N/A;  . PORTA CATH REMOVAL Right 06/03/2017   Procedure: PORTA CATH REMOVAL, with venous thrombectomy;  Surgeon: Algernon Huxley, MD;  Location: [place] CV [facility];  Service: Cardiovascular;  Laterality: Right;  . PORTACATH PLACEMENT Right     FAMILY HISTORY :   Family History  Problem Relation Age of Onset  . Heart disease Father        CABG x 4  . Multiple myeloma Father   . Hypertension Mother   . Pancreatic cancer Mother   . Ulcers Mother   . Asthma Mother   . Brain cancer Maternal Uncle   . Multiple myeloma Maternal Uncle   . Diabetes Neg Hx   . Breast cancer Neg Hx     SOCIAL HISTORY:   Social History   Tobacco Use  . Smoking status: Never  Smoker  . Smokeless tobacco: Never Used  Substance Use Topics  . Alcohol use: No    Alcohol/week: 0.0 standard drinks  . Drug use: No    ALLERGIES:  is allergic to ondansetron hcl; oxycodone; and rituximab.  MEDICATIONS:  Current Outpatient Medications  Medication Sig Dispense Refill  . acetaminophen (TYLENOL 8 HOUR) 650 MG CR tablet Take 650 mg by mouth every 8 (eight) hours as needed for pain.    . Carboxymethylcellulose Sodium (THERATEARS) 0.25 % SOLN Apply 1 drop to eye every 4 (four) hours as needed for dry eyes.    Marland Kitchen dexamethasone (DECADRON) 4 MG tablet Take 1 tablet by mouth every 12 (twelve) hours.    Marland Kitchen escitalopram (LEXAPRO) 10 MG tablet Take 1 tablet (10 mg total) by mouth daily. 90 tablet 2  . famotidine (PEPCID) 20 MG tablet Take by mouth.    . midodrine (PROAMATINE) 5 MG tablet Take 1 tablet by mouth 3 (three) times daily.    . ondansetron (ZOFRAN-ODT) 4 MG disintegrating tablet Take 1 tablet by mouth every 12 (twelve) hours.    . procarbazine (MATULANE) 50 MG capsule Take 4 capsules by mouth daily.    Marland Kitchen sulfamethoxazole-trimethoprim (BACTRIM) 400-80 MG tablet 40-80 mg Every Tuesday Thursday and Saturday    . Tbo-Filgrastim (GRANIX) 300 MCG/0.5ML SOSY injection Inject into the skin.    . valACYclovir (VALTREX) 500 MG tablet Take 1 tablet by mouth daily.    Marland Kitchen XIIDRA 5 %  SOLN Place 1 drop into both eyes 2 (two) times daily.    Marland Kitchen nystatin (MYCOSTATIN) 100000 UNIT/ML suspension Take 5 mLs (500,000 Units total) by mouth 4 (four) times daily. (Patient not taking: Reported on 03/19/2019) 473 mL 0  . ondansetron (ZOFRAN) 4 MG tablet Take 4 mg by mouth every 8 (eight) hours as needed for nausea or vomiting.    . prochlorperazine (COMPAZINE) 10 MG tablet Take 10 mg by mouth every 6 (six) hours as needed for nausea or vomiting.    . sodium chloride (ALTAMIST SPRAY) 0.65 % nasal spray Place 2 sprays into the nose every 6 (six) hours as needed.     No current facility-administered  medications for this visit.    Facility-Administered Medications Ordered in Other Visits  Medication Dose Route Frequency Provider Last Rate Last Dose  . 0.9 %  sodium chloride infusion   Intravenous Continuous Evlyn Kanner, NP   Stopped at 10/14/15 1312  . methotrexate (50 mg/ml) 6.3 g in sodium chloride 0.9 % 1,000 mL injection   Intravenous Once Charlaine Dalton R, MD      . sodium chloride flush (NS) 0.9 % injection 10 mL  10 mL Intravenous PRN Cammie Sickle, MD   10 mL at 10/05/16 1400    PHYSICAL EXAMINATION: ECOG PERFORMANCE STATUS: 0 - Asymptomatic  BP 116/71 (BP Location: Right Arm, Patient Position: Sitting)   Pulse 95   Temp 97.8 F (36.6 C) (Tympanic)   Resp 18   Ht 5' 2"  (1.575 m)   Wt 168 lb (76.2 kg)   BMI 30.73 kg/m   Filed Weights   04/20/19 0900  Weight: 168 lb (76.2 kg)   Physical Exam  Constitutional: She is oriented to person, place, and time and well-developed, well-nourished, and in no distress.  Accompanied by husband.  She in a wheelchair.  HENT:  Head: Normocephalic and atraumatic.  Mouth/Throat: Oropharynx is clear and moist. No oropharyngeal exudate.  Eyes: Pupils are equal, round, and reactive to light.  Neck: Normal range of motion. Neck supple.  Cardiovascular: Normal rate and regular rhythm.  Pulmonary/Chest: Effort normal and breath sounds normal. No respiratory distress. She has no wheezes.  Abdominal: Soft. Bowel sounds are normal. She exhibits no distension and no mass. There is no abdominal tenderness. There is no rebound and no guarding.  Musculoskeletal: Normal range of motion.        General: No tenderness or edema.  Neurological: She is alert and oriented to person, place, and time.  Skin: Skin is warm.  Psychiatric: Affect normal.    LABORATORY DATA:  I have reviewed the data as listed    Component Value Date/Time   NA 133 (L) 04/20/2019 0832   NA 139 06/29/2010 0749   K 5.3 (H) 04/20/2019 0832   CL 103  04/20/2019 0832   CO2 21 (L) 04/20/2019 0832   GLUCOSE 143 (H) 04/20/2019 0832   BUN 26 (H) 04/20/2019 0832   BUN 16 06/29/2010 0749   CREATININE 1.39 (H) 04/20/2019 0832   CALCIUM 9.0 04/20/2019 0832   PROT 6.3 (L) 04/20/2019 0832   ALBUMIN 4.2 04/20/2019 0832   AST 18 04/20/2019 0832   ALT 30 04/20/2019 0832   ALKPHOS 89 04/20/2019 0832   BILITOT 1.0 04/20/2019 0832   GFRNONAA 40 (L) 04/20/2019 0832   GFRAA 46 (L) 04/20/2019 0832    No results found for: SPEP, UPEP  Lab Results  Component Value Date   WBC 26.1 (H) 04/20/2019  NEUTROABS 21.1 (H) 04/20/2019   HGB 9.7 (L) 04/20/2019   HCT 29.2 (L) 04/20/2019   MCV 101.0 (H) 04/20/2019   PLT 73 (L) 04/20/2019      Chemistry      Component Value Date/Time   NA 133 (L) 04/20/2019 0832   NA 139 06/29/2010 0749   K 5.3 (H) 04/20/2019 0832   CL 103 04/20/2019 0832   CO2 21 (L) 04/20/2019 0832   BUN 26 (H) 04/20/2019 0832   BUN 16 06/29/2010 0749   CREATININE 1.39 (H) 04/20/2019 0832   GLU 93 06/29/2010 0749      Component Value Date/Time   CALCIUM 9.0 04/20/2019 0832   ALKPHOS 89 04/20/2019 0832   AST 18 04/20/2019 0832   ALT 30 04/20/2019 0832   BILITOT 1.0 04/20/2019 0832       RADIOGRAPHIC STUDIES: I have personally reviewed the radiological images as listed and agreed with the findings in the report. No results found.   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  Patient tolerating treatment fairly well except for orthostatic hypotension [see below].  Stable.  #Patient awaiting Rituxan infusion on 12/10; and admission for high-dose methotrexate on 12/11.   #Today white count is 26; hemoglobin is 9.7; platelets 73-leukocytosis secondary to recent Granix injections.  No PRBC/platelet transfusions needed today.   # Orthostatic hypotension/labile blood pressures-on midodrine; today  systolic blood pressure 189Q.  Unclear etiology.  If not improved as per Dr. Thera Flake might get repeat MRI brain sooner.  #Today potassium was 5.3; patient not on any supplementation.  Monitor closely patient awaiting repeat labs in 2 days from now.   #CKD-III- Question new baseline. creatinine-1.3/secondary to ongoing chemotherapy.  # DISPOSITION: #No transfusions needed today.  # 11/18-[facility]- labs- hold tube; cbc/bmp/ possible PRBC # Follow up with 12/21 MD- labs- hold tube; cbc/bmp/ possible PRBC- dr.b     Orders Placed This Encounter  Procedures  . CBC with Differential    Standing Status:   Future    Standing Expiration Date:   07/19/2019  . Basic metabolic panel    Standing Status:   Future    Standing Expiration Date:   07/19/2019  . Hold Tube- Blood Bank    Standing Status:   Future    Standing Expiration Date:   07/19/2019   All questions were answered. The patient knows to call the clinic with any problems, questions or concerns.      Cammie Sickle, MD 04/20/2019 12:16 PM

## 2019-04-20 NOTE — Assessment & Plan Note (Addendum)
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  Patient tolerating treatment fairly well except for orthostatic hypotension [see below].  Stable.  #Patient awaiting Rituxan infusion on 12/10; and admission for high-dose methotrexate on 12/11.   #Today white count is 26; hemoglobin is 9.7; platelets 73-leukocytosis secondary to recent Granix injections.  No PRBC/platelet transfusions needed today.   # Orthostatic hypotension/labile blood pressures-on midodrine; today systolic blood pressure [id number].  Unclear etiology.  If not improved as per Dr. Thera Flake might get repeat MRI brain sooner.  #Today potassium was 5.3; patient not on any supplementation.  Monitor closely patient awaiting repeat labs in 2 days from now.   #CKD-III- Question new baseline. creatinine-1.3/secondary to ongoing chemotherapy.  # DISPOSITION: #No transfusions needed today.  # 11/18-[facility]- labs- hold tube; cbc/bmp/ possible PRBC # Follow up with 12/21 MD- labs- hold tube; cbc/bmp/ possible PRBC- dr.b

## 2019-04-22 LAB — PREPARE PLATELET PHERESIS: Unit division: 0

## 2019-04-22 LAB — BPAM PLATELET PHERESIS
Blood Product Expiration Date: 202012102359
Unit Type and Rh: 6200

## 2019-04-22 LAB — NOVEL CORONAVIRUS, NAA: SARS-CoV-2, NAA: NOT DETECTED

## 2019-04-28 MED ORDER — VALACYCLOVIR HCL 500 MG PO TABS
500.00 | ORAL_TABLET | ORAL | Status: DC
Start: 2019-04-29 — End: 2019-04-28

## 2019-04-28 MED ORDER — SODIUM CHLORIDE 0.9 % IV SOLN
1000.00 | INTRAVENOUS | Status: DC
Start: ? — End: 2019-04-28

## 2019-04-28 MED ORDER — PROCHLORPERAZINE MALEATE 10 MG PO TABS
10.00 | ORAL_TABLET | ORAL | Status: DC
Start: ? — End: 2019-04-28

## 2019-04-28 MED ORDER — LOPERAMIDE HCL 2 MG PO CAPS
4.00 | ORAL_CAPSULE | ORAL | Status: DC
Start: ? — End: 2019-04-28

## 2019-04-28 MED ORDER — GENERIC EXTERNAL MEDICATION
Status: DC
Start: ? — End: 2019-04-28

## 2019-04-28 MED ORDER — METHYLPREDNISOLONE SODIUM SUCC 125 MG IJ SOLR
125.00 | INTRAMUSCULAR | Status: DC
Start: ? — End: 2019-04-28

## 2019-04-28 MED ORDER — MEPERIDINE HCL 25 MG/ML IJ SOLN
25.00 | INTRAMUSCULAR | Status: DC
Start: ? — End: 2019-04-28

## 2019-04-28 MED ORDER — LIDOCAINE VISCOUS HCL 2 % MT SOLN
15.00 | OROMUCOSAL | Status: DC
Start: ? — End: 2019-04-28

## 2019-04-28 MED ORDER — SODIUM CHLORIDE FLUSH 0.9 % IV SOLN
10.00 | INTRAVENOUS | Status: DC
Start: 2019-04-28 — End: 2019-04-28

## 2019-04-28 MED ORDER — ACETAMINOPHEN 325 MG PO TABS
650.00 | ORAL_TABLET | ORAL | Status: DC
Start: ? — End: 2019-04-28

## 2019-04-28 MED ORDER — SENNOSIDES 8.6 MG PO TABS
2.00 | ORAL_TABLET | ORAL | Status: DC
Start: 2019-04-29 — End: 2019-04-28

## 2019-04-28 MED ORDER — LOPERAMIDE HCL 2 MG PO CAPS
2.00 | ORAL_CAPSULE | ORAL | Status: DC
Start: ? — End: 2019-04-28

## 2019-04-28 MED ORDER — POTASSIUM CHLORIDE CRYS ER 10 MEQ PO TBCR
20.00 | EXTENDED_RELEASE_TABLET | ORAL | Status: DC
Start: ? — End: 2019-04-28

## 2019-04-28 MED ORDER — POTASSIUM CHLORIDE CRYS ER 20 MEQ PO TBCR
60.00 | EXTENDED_RELEASE_TABLET | ORAL | Status: DC
Start: ? — End: 2019-04-28

## 2019-04-28 MED ORDER — GENERIC EXTERNAL MEDICATION
7.50 | Status: DC
Start: 2019-04-28 — End: 2019-04-28

## 2019-04-28 MED ORDER — DIPHENHYDRAMINE HCL 50 MG/ML IJ SOLN
25.00 | INTRAMUSCULAR | Status: DC
Start: ? — End: 2019-04-28

## 2019-04-28 MED ORDER — FAMOTIDINE 20 MG PO TABS
20.00 | ORAL_TABLET | ORAL | Status: DC
Start: 2019-04-29 — End: 2019-04-28

## 2019-04-28 MED ORDER — DEXAMETHASONE 2 MG PO TABS
2.00 | ORAL_TABLET | ORAL | Status: DC
Start: 2019-04-29 — End: 2019-04-28

## 2019-04-28 MED ORDER — PROCHLORPERAZINE EDISYLATE 10 MG/2ML IJ SOLN
10.00 | INTRAMUSCULAR | Status: DC
Start: ? — End: 2019-04-28

## 2019-04-28 MED ORDER — ESCITALOPRAM OXALATE 10 MG PO TABS
10.00 | ORAL_TABLET | ORAL | Status: DC
Start: 2019-04-29 — End: 2019-04-28

## 2019-04-28 MED ORDER — FAMOTIDINE 20 MG/2ML IV SOLN
20.00 | INTRAVENOUS | Status: DC
Start: ? — End: 2019-04-28

## 2019-04-28 MED ORDER — EPINEPHRINE 0.3 MG/0.3ML IJ SOAJ
0.30 | INTRAMUSCULAR | Status: DC
Start: ? — End: 2019-04-28

## 2019-04-28 MED ORDER — CARBOXYMETHYLCELLULOSE SODIUM 1 % OP GEL
1.00 | OPHTHALMIC | Status: DC
Start: 2019-04-28 — End: 2019-04-28

## 2019-04-28 MED ORDER — CARBOXYMETHYLCELLULOSE SODIUM 0.25 % OP SOLN
1.00 | OPHTHALMIC | Status: DC
Start: ? — End: 2019-04-28

## 2019-04-28 MED ORDER — SODIUM CHLORIDE 0.9 % IV SOLN
20.00 | INTRAVENOUS | Status: DC
Start: ? — End: 2019-04-28

## 2019-04-28 MED ORDER — POTASSIUM CHLORIDE CRYS ER 10 MEQ PO TBCR
40.00 | EXTENDED_RELEASE_TABLET | ORAL | Status: DC
Start: ? — End: 2019-04-28

## 2019-05-01 ENCOUNTER — Other Ambulatory Visit: Payer: Self-pay

## 2019-05-01 ENCOUNTER — Inpatient Hospital Stay: Payer: BLUE CROSS/BLUE SHIELD

## 2019-05-01 ENCOUNTER — Inpatient Hospital Stay (HOSPITAL_BASED_OUTPATIENT_CLINIC_OR_DEPARTMENT_OTHER): Payer: BLUE CROSS/BLUE SHIELD | Admitting: Hospice and Palliative Medicine

## 2019-05-01 ENCOUNTER — Encounter: Payer: Self-pay | Admitting: Internal Medicine

## 2019-05-01 VITALS — BP 98/67 | HR 107 | Temp 98.5°F | Resp 20 | Ht 62.0 in | Wt 176.0 lb

## 2019-05-01 DIAGNOSIS — C8599 Non-Hodgkin lymphoma, unspecified, extranodal and solid organ sites: Secondary | ICD-10-CM

## 2019-05-01 DIAGNOSIS — C8338 Diffuse large B-cell lymphoma, lymph nodes of multiple sites: Secondary | ICD-10-CM

## 2019-05-01 LAB — BASIC METABOLIC PANEL
Anion gap: 11 (ref 5–15)
BUN: 18 mg/dL (ref 8–23)
CO2: 21 mmol/L — ABNORMAL LOW (ref 22–32)
Calcium: 9.3 mg/dL (ref 8.9–10.3)
Chloride: 104 mmol/L (ref 98–111)
Creatinine, Ser: 1.21 mg/dL — ABNORMAL HIGH (ref 0.44–1.00)
GFR calc Af Amer: 55 mL/min — ABNORMAL LOW (ref >60)
GFR calc non Af Amer: 47 mL/min — ABNORMAL LOW (ref >60)
Glucose, Bld: 132 mg/dL — ABNORMAL HIGH (ref 70–99)
Potassium: 4.4 mmol/L (ref 3.5–5.1)
Sodium: 136 mmol/L (ref 135–145)

## 2019-05-01 LAB — CBC WITH DIFFERENTIAL/PLATELET
Abs Immature Granulocytes: 0 10*3/uL (ref 0.00–0.07)
Band Neutrophils: 8 %
Basophils Absolute: 0 10*3/uL (ref 0.0–0.1)
Basophils Relative: 0 %
Eosinophils Absolute: 0 10*3/uL (ref 0.0–0.5)
Eosinophils Relative: 0 %
HCT: 25.8 % — ABNORMAL LOW (ref 36.0–46.0)
Hemoglobin: 8.5 g/dL — ABNORMAL LOW (ref 12.0–15.0)
Lymphocytes Relative: 3 %
Lymphs Abs: 1.5 10*3/uL (ref 0.7–4.0)
MCH: 34.6 pg — ABNORMAL HIGH (ref 26.0–34.0)
MCHC: 32.9 g/dL (ref 30.0–36.0)
MCV: 104.9 fL — ABNORMAL HIGH (ref 80.0–100.0)
Monocytes Absolute: 1 10*3/uL (ref 0.1–1.0)
Monocytes Relative: 2 %
Neutro Abs: 47.4 10*3/uL — ABNORMAL HIGH (ref 1.7–7.7)
Neutrophils Relative %: 87 %
Platelets: 71 10*3/uL — ABNORMAL LOW (ref 150–400)
RBC: 2.46 MIL/uL — ABNORMAL LOW (ref 3.87–5.11)
RDW: 20.5 % — ABNORMAL HIGH (ref 11.5–15.5)
Smear Review: NORMAL
WBC: 49.9 10*3/uL — ABNORMAL HIGH (ref 4.0–10.5)
nRBC: 0 % (ref 0.0–0.2)

## 2019-05-01 LAB — SAMPLE TO BLOOD BANK

## 2019-05-01 NOTE — Progress Notes (Signed)
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  She was seen in the clinic by Dr. Rogue Bussing on 04/20/2019.  She was found to have intermittent orthostatic hypotension unclear etiology.  Patient is on midodrine.  She received Rituxan infusion on 12/10 and admission for high-dose methotrexate on 12/11 at [facility].  [facility] visit was scheduled for 12/18 for follow-up.  Denies any neurologic complaints. Denies recent fevers or illnesses. Denies any easy bleeding or bruising. Reports good appetite and denies weight loss. Denies chest pain. Denies any nausea, vomiting, constipation, or diarrhea. Denies urinary complaints. Patient offers no further specific complaints today.  ECOG FS:2 - Symptomatic, <50% confined to bed  Review of systems- Review of Systems  Constitutional: Negative for chills, fever and weight loss.  Respiratory: Negative for cough and shortness of breath.   Gastrointestinal: Negative for abdominal pain, constipation, diarrhea, nausea and vomiting.  Genitourinary: Negative for dysuria, flank pain, frequency, hematuria and urgency.  Neurological: Positive for dizziness and weakness. Negative for focal weakness and headaches.     Current treatment-R-MPV at [facility]  Allergies  Allergen Reactions  . Ondansetron Hcl Other (See Comments)    Severe constipation   . Oxycodone    Biliary colic with opioids s/p cholecystectomy    . Rituximab Other (See Comments)    Pt reports throat tightness and facial swelling and redness during infusion of Rituxan    Past Medical History:  Diagnosis Date  . Arthritis   . Blood dyscrasia   . Cancer ([place])   . Chest pain, unspecified   . Diverticulosis 07/06/15  . Dysrhythmia    tachycardia  . Esophagitis 07/06/15  . Fatigue   . Gastritis 07/06/15  . GERD (gastroesophageal reflux disease)   . 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 Leg cramps   . Lymphoma ([place]) 09/14/15   Monoclonal B cell lymphoma  . Night sweats   . Osteoporosis   . Overweight(278.02)    Obesity  . Palpitations   . Shortness of breath dyspnea   . Tachycardia   . Thrombocytopenia ([place])     Past Surgical History:  Procedure Laterality Date  . ABDOMINAL HYSTERECTOMY  2005  . BONE MARROW BIOPSY  09/14/15  . CHOLECYSTECTOMY  1997  . COLONOSCOPY  07/05/2014  . ESOPHAGOGASTRODUODENOSCOPY  07/05/2014  . INGUINAL LYMPH NODE BIOPSY Right 10/05/2015   Procedure: INGUINAL LYMPH NODE BIOPSY;  Surgeon: Christene Lye, MD;  Location: [facility] ORS;  Service: General;  Laterality: Right;  . PERIPHERAL VASCULAR CATHETERIZATION N/A 09/27/2015   Procedure: Glori Luis Cath Insertion;  Surgeon: Algernon Huxley, MD;  Location: [facility];  Service: Cardiovascular;  Laterality: N/A;  . PORTA CATH REMOVAL Right 06/03/2017   Procedure: PORTA CATH REMOVAL, with venous thrombectomy;  Surgeon: Algernon Huxley, MD;  Location: [facility];  Service: Cardiovascular;  Laterality: Right;  . PORTACATH PLACEMENT Right     Social History  Socioeconomic History  . Marital status: Married    Spouse name: Not on file  . Number of children: Not on file  . Years of education: Not on file  . Highest education level: Not on file  Occupational History  . Not on file  Tobacco Use  . Smoking status: Never Smoker  . Smokeless tobacco:  Never Used  Substance and Sexual Activity  . Alcohol use: No    Alcohol/week: 0.0 standard drinks  . Drug use: No  . Sexual activity: Yes    Birth control/protection: None  Other Topics Concern  . Not on file  Social History Narrative   Married   Does not get regular exercise   Social Determinants of Health   Financial Resource Strain:   . Difficulty of Paying Living Expenses: Not on file  Food Insecurity:   . Worried About Charity fundraiser in the Last Year: Not on file  . Ran Out of Food in the Last Year: Not on file  Transportation Needs:   . Lack of Transportation (Medical): Not on file  . Lack of Transportation (Non-Medical): Not on file  Physical Activity:   . Days of Exercise per Week: Not on file  . Minutes of Exercise per Session: Not on file  Stress:   . Feeling of Stress : Not on file  Social Connections:   . Frequency of Communication with Friends and Family: Not on file  . Frequency of Social Gatherings with Friends and Family: Not on file  . Attends Religious Services: Not on file  . Active Member of Clubs or Organizations: Not on file  . Attends Archivist Meetings: Not on file  . Marital Status: Not on file  Intimate Partner Violence:   . Fear of Current or Ex-Partner: Not on file  . Emotionally Abused: Not on file  . Physically Abused: Not on file  . Sexually Abused: Not on file    Family History  Problem Relation Age of Onset  . Heart disease Father        CABG x 4  . Multiple myeloma Father   . Hypertension Mother   . Pancreatic cancer Mother   . Ulcers Mother   . Asthma Mother   . Brain cancer Maternal Uncle   . Multiple myeloma Maternal Uncle   . Diabetes Neg Hx   . Breast cancer Neg Hx      Current Outpatient Medications:  .  acetaminophen (TYLENOL 8 HOUR) 650 MG CR tablet, Take 650 mg by mouth every 8 (eight) hours as needed for pain., Disp: , Rfl:  .  Carboxymethylcellulose Sodium (THERATEARS) 0.25 % SOLN, Apply 1 drop to  eye every 4 (four) hours as needed for dry eyes., Disp: , Rfl:  .  dexamethasone (DECADRON) 4 MG tablet, Take 1 tablet by mouth every 12 (twelve) hours., Disp: , Rfl:  .  escitalopram (LEXAPRO) 10 MG tablet, Take 1 tablet (10 mg total) by mouth daily., Disp: 90 tablet, Rfl: 2 .  famotidine (PEPCID) 20 MG tablet, Take by mouth., Disp: , Rfl:  .  midodrine (PROAMATINE) 5 MG tablet, Take 1 tablet by mouth 3 (three) times daily., Disp: , Rfl:  .  nystatin (MYCOSTATIN) 100000 UNIT/ML suspension, Take 5 mLs (500,000 Units total) by mouth 4 (four) times daily. (Patient not taking: Reported on 03/19/2019), Disp: 473 mL, Rfl: 0 .  ondansetron (ZOFRAN) 4 MG tablet, Take 4 mg by mouth every 8 (eight) hours as needed for nausea or  vomiting., Disp: , Rfl:  .  ondansetron (ZOFRAN-ODT) 4 MG disintegrating tablet, Take 1 tablet by mouth every 12 (twelve) hours., Disp: , Rfl:  .  procarbazine (MATULANE) 50 MG capsule, Take 4 capsules by mouth daily., Disp: , Rfl:  .  prochlorperazine (COMPAZINE) 10 MG tablet, Take 10 mg by mouth every 6 (six) hours as needed for nausea or vomiting., Disp: , Rfl:  .  sodium chloride (ALTAMIST SPRAY) 0.65 % nasal spray, Place 2 sprays into the nose every 6 (six) hours as needed., Disp: , Rfl:  .  sulfamethoxazole-trimethoprim (BACTRIM) 400-80 MG tablet, 40-80 mg Every Tuesday Thursday and Saturday, Disp: , Rfl:  .  Tbo-Filgrastim (GRANIX) 300 MCG/0.5ML SOSY injection, Inject into the skin., Disp: , Rfl:  .  valACYclovir (VALTREX) 500 MG tablet, Take 1 tablet by mouth daily., Disp: , Rfl:  .  XIIDRA 5 % SOLN, Place 1 drop into both eyes 2 (two) times daily., Disp: , Rfl:  No current facility-administered medications for this visit.  Facility-Administered Medications Ordered in Other Visits:  .  0.9 %  sodium chloride infusion, , Intravenous, Continuous, Herring, Orville Govern, NP, Stopped at 10/14/15 1312 .  methotrexate (50 mg/ml) 6.3 g in sodium chloride 0.9 % 1,000 mL injection, ,  Intravenous, Once, Brahmanday, Govinda R, MD .  sodium chloride flush (NS) 0.9 % injection 10 mL, 10 mL, Intravenous, PRN, Cammie Sickle, MD, 10 mL at 10/05/16 1400  Physical exam: There were no vitals filed for this visit. Physical Exam Constitutional:      Appearance: Normal appearance.  Cardiovascular:     Rate and Rhythm: Normal rate and regular rhythm.     Pulses: Normal pulses.     Heart sounds: Normal heart sounds.  Pulmonary:     Effort: Pulmonary effort is normal.     Breath sounds: Normal breath sounds.  Abdominal:     General: Abdomen is flat. Bowel sounds are normal.     Palpations: Abdomen is soft.  Musculoskeletal:        General: Normal range of motion.     Cervical back: Normal range of motion and neck supple.  Skin:    General: Skin is warm and dry.  Neurological:     General: No focal deficit present.     Mental Status: She is alert and oriented to person, place, and time.      CMP Latest Ref Rng & Units 04/20/2019  Glucose 70 - 99 mg/dL 143(H)  BUN 8 - 23 mg/dL 26(H)  Creatinine 0.44 - 1.00 mg/dL 1.39(H)  Sodium 135 - 145 mmol/L 133(L)  Potassium 3.5 - 5.1 mmol/L 5.3(H)  Chloride 98 - 111 mmol/L 103  CO2 22 - 32 mmol/L 21(L)  Calcium 8.9 - 10.3 mg/dL 9.0  Total Protein 6.5 - 8.1 g/dL 6.3(L)  Total Bilirubin 0.3 - 1.2 mg/dL 1.0  Alkaline Phos 38 - 126 U/L 89  AST 15 - 41 U/L 18  ALT 0 - 44 U/L 30   CBC Latest Ref Rng & Units 04/20/2019  WBC 4.0 - 10.5 K/uL 26.1(H)  Hemoglobin 12.0 - 15.0 g/dL 9.7(L)  Hematocrit 36.0 - 46.0 % 29.2(L)  Platelets 150 - 400 K/uL 73(L)    No images are attached to the encounter.  No results found.  Assessment and plan- Patient is a 63 y.o. 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  Large  B-cell lymphoma with CNS relapse: Status post rituximab/methotrexate last week.  CBC and BMET  stable today.  Likely hemoglobin and platelets at nadir.  Does not require transfusion or IV fluids today.  Dizziness: Likely multifactorial.    Patient is on dexamethasone and midodrine.  Symptoms overall are stable.  She has had no falls.  Her husband assists her with ambulating in the home with use of a walker and transport chair.  Discussed home safety and fall mitigation strategies.  May benefit from evaluation with clinic OT, although patient says that she is seen PT/OT at [facility].  Dispo: Follow-up visit with Dr. Rogue Bussing on 12/21  Case and plan discussed with Dr. Rogue Bussing.  Thank you for allowing me to participate in the care of this very pleasant patient.   Time Total: 15 minutes  Visit consisted of counseling and education dealing with the complex and emotionally intense issues of symptom management and palliative care in the setting of serious and potentially life-threatening illness.Greater than 50%  of this time was spent counseling and coordinating care related to the above assessment and plan.  Signed by: Altha Harm, PhD, NP-C

## 2019-05-04 ENCOUNTER — Inpatient Hospital Stay: Payer: BLUE CROSS/BLUE SHIELD

## 2019-05-04 ENCOUNTER — Other Ambulatory Visit: Payer: Self-pay

## 2019-05-04 ENCOUNTER — Inpatient Hospital Stay (HOSPITAL_BASED_OUTPATIENT_CLINIC_OR_DEPARTMENT_OTHER): Payer: BLUE CROSS/BLUE SHIELD | Admitting: Internal Medicine

## 2019-05-04 DIAGNOSIS — C8583 Other specified types of non-Hodgkin lymphoma, intra-abdominal lymph nodes: Secondary | ICD-10-CM

## 2019-05-04 DIAGNOSIS — C8338 Diffuse large B-cell lymphoma, lymph nodes of multiple sites: Secondary | ICD-10-CM

## 2019-05-04 LAB — CBC WITH DIFFERENTIAL/PLATELET
Abs Immature Granulocytes: 1.7 10*3/uL — ABNORMAL HIGH (ref 0.00–0.07)
Band Neutrophils: 3 %
Basophils Absolute: 0 10*3/uL (ref 0.0–0.1)
Basophils Relative: 0 %
Eosinophils Absolute: 0 10*3/uL (ref 0.0–0.5)
Eosinophils Relative: 0 %
HCT: 27.8 % — ABNORMAL LOW (ref 36.0–46.0)
Hemoglobin: 8.9 g/dL — ABNORMAL LOW (ref 12.0–15.0)
Lymphocytes Relative: 31 %
Lymphs Abs: 4.1 10*3/uL — ABNORMAL HIGH (ref 0.7–4.0)
MCH: 35 pg — ABNORMAL HIGH (ref 26.0–34.0)
MCHC: 32 g/dL (ref 30.0–36.0)
MCV: 109.4 fL — ABNORMAL HIGH (ref 80.0–100.0)
Metamyelocytes Relative: 5 %
Monocytes Absolute: 0.8 10*3/uL (ref 0.1–1.0)
Monocytes Relative: 6 %
Myelocytes: 5 %
Neutro Abs: 6.6 10*3/uL (ref 1.7–7.7)
Neutrophils Relative %: 47 %
Platelets: 48 10*3/uL — ABNORMAL LOW (ref 150–400)
Promyelocytes Relative: 3 %
RBC: 2.54 MIL/uL — ABNORMAL LOW (ref 3.87–5.11)
RDW: 22.5 % — ABNORMAL HIGH (ref 11.5–15.5)
Smear Review: NORMAL
WBC: 13.2 10*3/uL — ABNORMAL HIGH (ref 4.0–10.5)
nRBC: 3.7 % — ABNORMAL HIGH (ref 0.0–0.2)

## 2019-05-04 LAB — SAMPLE TO BLOOD BANK

## 2019-05-04 NOTE — Assessment & Plan Note (Addendum)
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  Patient tolerating treatment fairly well except for orthostatic hypotension [see below].  Stable  # Patient awaiting Rituxan infusion on 12/23; and admission for high-dose methotrexate on 12/28.  Also awaiting further work-up with MRI brain/PET scan.  #Shortness of breath on exertion/tachycardia-with ambulation-pulse ox 100%; heart rate low 100s.  Low clinical suspicion for PE.  Discussed with patient and family.  Recommend getting a pulse ox at home-monitor closely.   #Today white count is 13; hemoglobin is 8.9; platelets 48-leukocytosis secondary to recent Granix injections.  No PRBC/platelet transfusions needed today.   # Orthostatic hypotension/labile blood pressures-on midodrine- clinically stable.   #CKD-III- Question new baseline. creatinine-1.2/secondary to ongoing chemotherapy.  #Epistaxis-mildly low platelets; [40s]; more suggestive of dry mucosal membranes.  Recommend continued Vaseline/saline spray.  # DISPOSITION: #No transfusions needed today.  # Follow up on 11/08- MD; labs- cbc/cmp/HOLD tube/ possible 1 unit PRBC/1 unit platelets-Dr.B

## 2019-05-04 NOTE — Patient Instructions (Signed)
#   get pulse ox; check levels daily; call us if oxygen levels- persistently around 90s or below 90s.

## 2019-05-04 NOTE — Progress Notes (Signed)
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 CT- 1-2 CM LN subpectoral/medistinal/ retro-peritoneal/pelvic/ Right inguinal LN 1.6cm- Bx- DLBCL- ABC; myc-POS; FISH gene re-arragement-NEG. PET- MULTIPLE LN/ Bone involvement; R-CHOP x6- CR'# OCT 2017- PET scan- CR; DEC 22nd 2017- BMBx- "atypical large B cells- <1%  [[facility]; II opinion- Dr.Grover- review of path- not concerning for residual lymphoma]; recommend surviellance  # March 26th  2018PET- NED  # Lumbar puncture- difficult-spinal headache. S/p high dose MXT x4.  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   # OCT 2020- MASS  spanning the corpus callosum. Brain Biopsy  at [facility]-DLBCL. CD20+. EBV -, Ki67+, Pax5+. Port placed 10/27, R-MPV started 10/27 [facility]; Dr.Grover]. ---------------------------------------------------------- # June 2017-Elevated LFTs-  valacylovir/diflucan; resolved.   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   # May 2017- EF- 55-65%  #August 2019-double vision; secondary to refractive error-prism improved. 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  Patient was recently seen in the symptomatic management clinic last week.  Patient did not need any blood/platelet transfusion.  Patient today complains of increasing shortness of breath especially exertion.  Denies any headaches.  Denies any nausea vomiting.  No cough.  No fevers.  She also feels dizzy when she stands up.  However no falls.  Review of Systems  Constitutional: Positive for malaise/fatigue. Negative for chills, diaphoresis, fever and weight loss.  HENT: Negative for nosebleeds and sore throat.   Eyes: Negative for double vision.  Respiratory: Positive for shortness of breath. Negative for cough, hemoptysis, sputum production and wheezing.   Cardiovascular: Negative for chest pain, orthopnea and leg swelling.  Gastrointestinal: Negative for abdominal pain, blood in stool, constipation, diarrhea, heartburn, melena, nausea and vomiting.  Genitourinary: Negative for dysuria, frequency and urgency.  Musculoskeletal: Negative for back pain and joint pain.  Skin: Negative.  Negative for itching and rash.  Neurological: Positive for dizziness. Negative for tingling, focal weakness and weakness.  Endo/Heme/Allergies: Does not bruise/bleed easily.  Psychiatric/Behavioral: Negative for depression. The patient does not  have insomnia.       PAST MEDICAL HISTORY :  Past Medical History:  Diagnosis Date  . Arthritis   . Blood dyscrasia   .  Cancer ([place])   . Chest pain, unspecified   . Diverticulosis 07/06/15  . Dysrhythmia    tachycardia  . Esophagitis 07/06/15  . Fatigue   . Gastritis 07/06/15  . GERD (gastroesophageal reflux disease)   . 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 Leg cramps   . 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 Night sweats   . Osteoporosis   . Overweight(278.02)    Obesity  . Palpitations   . Shortness of breath dyspnea   . Tachycardia   . Thrombocytopenia ([place])     PAST SURGICAL HISTORY :   Past Surgical History:  Procedure Laterality Date  . ABDOMINAL HYSTERECTOMY  2005  . BONE MARROW BIOPSY  09/14/15  . CHOLECYSTECTOMY  1997  . COLONOSCOPY  07/05/2014  . ESOPHAGOGASTRODUODENOSCOPY  07/05/2014  . INGUINAL LYMPH NODE BIOPSY Right 10/05/2015   Procedure: INGUINAL LYMPH NODE BIOPSY;  Surgeon: Christene Lye, MD;  Location: [facility] ORS;  Service: General;  Laterality: Right;  . PERIPHERAL VASCULAR CATHETERIZATION N/A 09/27/2015   Procedure: Glori Luis Cath Insertion;  Surgeon: Algernon Huxley, MD;  Location: [place] [facility];  Service: Cardiovascular;  Laterality: N/A;  . PORTA CATH REMOVAL Right 06/03/2017   Procedure: PORTA CATH REMOVAL, with venous thrombectomy;  Surgeon: Algernon Huxley, MD;  Location: [place] [facility];  Service: Cardiovascular;  Laterality: Right;  . PORTACATH PLACEMENT Right     FAMILY HISTORY :   Family History  Problem Relation Age of Onset  . Heart disease Father        CABG x 4  . Multiple myeloma Father   . Hypertension Mother   . Pancreatic cancer Mother   . Ulcers Mother   . Asthma Mother   . Brain cancer Maternal Uncle   . Multiple myeloma Maternal Uncle   . Diabetes Neg Hx   . Breast cancer Neg Hx     SOCIAL HISTORY:   Social History   Tobacco Use  . Smoking status:  Never Smoker  . Smokeless tobacco: Never Used  Substance Use Topics  . Alcohol use: No    Alcohol/week: 0.0 standard drinks  . Drug use: No    ALLERGIES:  is allergic to ondansetron hcl; oxycodone; and rituximab.  MEDICATIONS:  Current Outpatient Medications  Medication Sig Dispense Refill  . acetaminophen (TYLENOL 8 HOUR) 650 MG CR tablet Take 650 mg by mouth every 8 (eight) hours as needed for pain.    . Carboxymethylcellulose Sodium (THERATEARS) 0.25 % SOLN Apply 1 drop to eye every 4 (four) hours as needed for dry eyes.    . cefdinir (OMNICEF) 300 MG capsule Take 1 capsule by mouth 2 (two) times daily.    Marland Kitchen dexamethasone (DECADRON) 4 MG tablet Take 1 tablet by mouth every 12 (twelve) hours.    Marland Kitchen escitalopram (LEXAPRO) 10 MG tablet Take 1 tablet (10 mg total) by mouth daily. 90 tablet 2  . famotidine (PEPCID) 20 MG tablet Take by mouth.    . midodrine (PROAMATINE) 5 MG tablet Take 1 tablet by mouth 3 (three) times daily.    Marland Kitchen nystatin (MYCOSTATIN) 100000 UNIT/ML suspension Take 5 mLs (500,000 Units total) by mouth 4 (four) times daily. 473 mL 0  . ondansetron (ZOFRAN) 4 MG tablet Take 4 mg by mouth every 8 (eight) hours as needed for nausea or vomiting.    . ondansetron (ZOFRAN-ODT) 4 MG disintegrating tablet Take 1 tablet by mouth every  12 (twelve) hours.    . procarbazine (MATULANE) 50 MG capsule Take 4 capsules by mouth daily.    . prochlorperazine (COMPAZINE) 10 MG tablet Take 10 mg by mouth every 6 (six) hours as needed for nausea or vomiting.    . sodium chloride (ALTAMIST SPRAY) 0.65 % nasal spray Place 2 sprays into the nose every 6 (six) hours as needed.    . sulfamethoxazole-trimethoprim (BACTRIM) 400-80 MG tablet 40-80 mg Every Tuesday Thursday and Saturday    . Tbo-Filgrastim (GRANIX) 300 MCG/0.5ML SOSY injection Inject into the skin.    . valACYclovir (VALTREX) 500 MG tablet Take 1 tablet by mouth daily.    Marland Kitchen XIIDRA 5 % SOLN Place 1 drop into both eyes 2 (two) times  daily.     No current facility-administered medications for this visit.   Facility-Administered Medications Ordered in Other Visits  Medication Dose Route Frequency Provider Last Rate Last Admin  . 0.9 %  sodium chloride infusion   Intravenous Continuous Evlyn Kanner, NP   Stopped at 10/14/15 1312  . methotrexate (50 mg/ml) 6.3 g in sodium chloride 0.9 % 1,000 mL injection   Intravenous Once Charlaine Dalton R, MD      . sodium chloride flush (NS) 0.9 % injection 10 mL  10 mL Intravenous PRN Cammie Sickle, MD   10 mL at 10/05/16 1400    PHYSICAL EXAMINATION: ECOG PERFORMANCE STATUS: 0 - Asymptomatic  BP 131/80 (BP Location: Left Arm, Patient Position: Sitting, Cuff Size: Normal)   Pulse (!) 116   Temp 97.8 F (36.6 C) (Tympanic)   Ht 5' 2"  (1.575 m)   Wt 171 lb 6 oz (77.7 kg)   SpO2 97%   BMI 31.34 kg/m   Filed Weights   05/01/19 1200  Weight: 171 lb 6 oz (77.7 kg)   Physical Exam  Constitutional: She is oriented to person, place, and time and well-developed, well-nourished, and in no distress.  Accompanied by husband.  She in a wheelchair.  HENT:  Head: Normocephalic and atraumatic.  Mouth/Throat: Oropharynx is clear and moist. No oropharyngeal exudate.  Eyes: Pupils are equal, round, and reactive to light.  Cardiovascular: Normal rate and regular rhythm.  Pulmonary/Chest: Effort normal and breath sounds normal. No respiratory distress. She has no wheezes.  Abdominal: Soft. Bowel sounds are normal. She exhibits no distension and no mass. There is no abdominal tenderness. There is no rebound and no guarding.  Musculoskeletal:        General: No tenderness or edema. Normal range of motion.     Cervical back: Normal range of motion and neck supple.  Neurological: She is alert and oriented to person, place, and time.  Skin: Skin is warm.  Psychiatric: Affect normal.    LABORATORY DATA:  I have reviewed the data as listed    Component Value Date/Time   NA  136 05/01/2019 0900   NA 139 06/29/2010 0749   K 4.4 05/01/2019 0900   CL 104 05/01/2019 0900   CO2 21 (L) 05/01/2019 0900   GLUCOSE 132 (H) 05/01/2019 0900   BUN 18 05/01/2019 0900   BUN 16 06/29/2010 0749   CREATININE 1.21 (H) 05/01/2019 0900   CALCIUM 9.3 05/01/2019 0900   PROT 6.3 (L) 04/20/2019 0832   ALBUMIN 4.2 04/20/2019 0832   AST 18 04/20/2019 0832   ALT 30 04/20/2019 0832   ALKPHOS 89 04/20/2019 0832   BILITOT 1.0 04/20/2019 0832   GFRNONAA 47 (L) 05/01/2019 0900   GFRAA  55 (L) 05/01/2019 0900    No results found for: SPEP, UPEP  Lab Results  Component Value Date   WBC 13.2 (H) 05/04/2019   NEUTROABS 6.6 05/04/2019   HGB 8.9 (L) 05/04/2019   HCT 27.8 (L) 05/04/2019   MCV 109.4 (H) 05/04/2019   PLT 48 (L) 05/04/2019      Chemistry      Component Value Date/Time   NA 136 05/01/2019 0900   NA 139 06/29/2010 0749   K 4.4 05/01/2019 0900   CL 104 05/01/2019 0900   CO2 21 (L) 05/01/2019 0900   BUN 18 05/01/2019 0900   BUN 16 06/29/2010 0749   CREATININE 1.21 (H) 05/01/2019 0900   GLU 93 06/29/2010 0749      Component Value Date/Time   CALCIUM 9.3 05/01/2019 0900   ALKPHOS 89 04/20/2019 0832   AST 18 04/20/2019 0832   ALT 30 04/20/2019 0832   BILITOT 1.0 04/20/2019 0832       RADIOGRAPHIC STUDIES: I have personally reviewed the radiological images as listed and agreed with the findings in the report. No results found.   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  Patient tolerating treatment fairly well except for orthostatic hypotension [see below].  Stable  # Patient awaiting Rituxan infusion on 12/23; and admission for high-dose methotrexate on 12/28.  Also awaiting further work-up with MRI brain/PET scan.  #Shortness of breath on exertion/tachycardia-with ambulation-pulse ox 100%; heart rate low 100s.  Low clinical  suspicion for PE.  Discussed with patient and family.  Recommend getting a pulse ox at home-monitor closely.   #Today white count is 13; hemoglobin is 8.9; platelets 48-leukocytosis secondary to recent Granix injections.  No PRBC/platelet transfusions needed today.   # Orthostatic hypotension/labile blood pressures-on midodrine- clinically stable.   #CKD-III- Question new baseline. creatinine-1.2/secondary to ongoing chemotherapy.  #Epistaxis-mildly low platelets; [40s]; more suggestive of dry mucosal membranes.  Recommend continued Vaseline/saline spray.  # DISPOSITION: #No transfusions needed today.  # Follow up on 11/08- MD; labs- cbc/cmp/HOLD tube/ possible 1 unit PRBC/1 unit platelets-Dr.B     Orders Placed This Encounter  Procedures  . CBC with Differential    Standing Status:   Future    Standing Expiration Date:   05/03/2020  . Comprehensive metabolic panel    Standing Status:   Future    Standing Expiration Date:   05/03/2020  . Hold Tube- Blood Bank    Standing Status:   Future    Standing Expiration Date:   05/03/2020   All questions were answered. The patient knows to call the clinic with any problems, questions or concerns.      Cammie Sickle, MD 05/04/2019 1:14 PM

## 2019-05-06 LAB — NOVEL CORONAVIRUS, NAA: SARS-CoV-2, NAA: NOT DETECTED

## 2019-05-12 ENCOUNTER — Other Ambulatory Visit: Payer: Self-pay | Admitting: *Deleted

## 2019-05-12 DIAGNOSIS — C8583 Other specified types of non-Hodgkin lymphoma, intra-abdominal lymph nodes: Secondary | ICD-10-CM

## 2019-05-16 MED ORDER — GENERIC EXTERNAL MEDICATION
Status: DC
Start: ? — End: 2019-05-16

## 2019-05-16 MED ORDER — MEPERIDINE HCL 25 MG/ML IJ SOLN
25.00 | INTRAMUSCULAR | Status: DC
Start: ? — End: 2019-05-16

## 2019-05-16 MED ORDER — FAMOTIDINE 20 MG/2ML IV SOLN
20.00 | INTRAVENOUS | Status: DC
Start: ? — End: 2019-05-16

## 2019-05-16 MED ORDER — FAMOTIDINE 20 MG PO TABS
20.00 | ORAL_TABLET | ORAL | Status: DC
Start: 2019-05-17 — End: 2019-05-16

## 2019-05-16 MED ORDER — SENNOSIDES 8.6 MG PO TABS
2.00 | ORAL_TABLET | ORAL | Status: DC
Start: 2019-05-17 — End: 2019-05-16

## 2019-05-16 MED ORDER — ONDANSETRON HCL 8 MG PO TABS
8.00 | ORAL_TABLET | ORAL | Status: DC
Start: 2019-05-17 — End: 2019-05-16

## 2019-05-16 MED ORDER — SALINE NASAL SPRAY 0.65 % NA SOLN
1.00 | NASAL | Status: DC
Start: ? — End: 2019-05-16

## 2019-05-16 MED ORDER — DSS 100 MG PO CAPS
100.00 | ORAL_CAPSULE | ORAL | Status: DC
Start: ? — End: 2019-05-16

## 2019-05-16 MED ORDER — SODIUM CHLORIDE 0.9 % IV SOLN
1000.00 | INTRAVENOUS | Status: DC
Start: ? — End: 2019-05-16

## 2019-05-16 MED ORDER — LOPERAMIDE HCL 2 MG PO CAPS
2.00 | ORAL_CAPSULE | ORAL | Status: DC
Start: ? — End: 2019-05-16

## 2019-05-16 MED ORDER — POTASSIUM CHLORIDE CRYS ER 10 MEQ PO TBCR
20.00 | EXTENDED_RELEASE_TABLET | ORAL | Status: DC
Start: ? — End: 2019-05-16

## 2019-05-16 MED ORDER — VALACYCLOVIR HCL 500 MG PO TABS
500.00 | ORAL_TABLET | ORAL | Status: DC
Start: 2019-05-17 — End: 2019-05-16

## 2019-05-16 MED ORDER — PROCHLORPERAZINE EDISYLATE 10 MG/2ML IJ SOLN
10.00 | INTRAMUSCULAR | Status: DC
Start: ? — End: 2019-05-16

## 2019-05-16 MED ORDER — PROCHLORPERAZINE MALEATE 10 MG PO TABS
10.00 | ORAL_TABLET | ORAL | Status: DC
Start: ? — End: 2019-05-16

## 2019-05-16 MED ORDER — SODIUM CHLORIDE FLUSH 0.9 % IV SOLN
10.00 | INTRAVENOUS | Status: DC
Start: 2019-05-16 — End: 2019-05-16

## 2019-05-16 MED ORDER — POTASSIUM CHLORIDE CRYS ER 20 MEQ PO TBCR
60.00 | EXTENDED_RELEASE_TABLET | ORAL | Status: DC
Start: ? — End: 2019-05-16

## 2019-05-16 MED ORDER — LOPERAMIDE HCL 2 MG PO CAPS
4.00 | ORAL_CAPSULE | ORAL | Status: DC
Start: ? — End: 2019-05-16

## 2019-05-16 MED ORDER — METHYLPREDNISOLONE SODIUM SUCC 125 MG IJ SOLR
125.00 | INTRAMUSCULAR | Status: DC
Start: ? — End: 2019-05-16

## 2019-05-16 MED ORDER — PROCARBAZINE HCL 50 MG PO CAPS
100.00 | ORAL_CAPSULE | ORAL | Status: DC
Start: 2019-05-17 — End: 2019-05-16

## 2019-05-16 MED ORDER — ACETAMINOPHEN 325 MG PO TABS
650.00 | ORAL_TABLET | ORAL | Status: DC
Start: ? — End: 2019-05-16

## 2019-05-16 MED ORDER — DEXAMETHASONE 0.5 MG PO TABS
0.50 | ORAL_TABLET | ORAL | Status: DC
Start: 2019-05-17 — End: 2019-05-16

## 2019-05-16 MED ORDER — POLYETHYLENE GLYCOL 3350 17 GM/SCOOP PO POWD
17.00 | ORAL | Status: DC
Start: ? — End: 2019-05-16

## 2019-05-16 MED ORDER — CARBOXYMETHYLCELLULOSE SODIUM 0.25 % OP SOLN
1.00 | OPHTHALMIC | Status: DC
Start: ? — End: 2019-05-16

## 2019-05-16 MED ORDER — ESCITALOPRAM OXALATE 10 MG PO TABS
10.00 | ORAL_TABLET | ORAL | Status: DC
Start: 2019-05-17 — End: 2019-05-16

## 2019-05-16 MED ORDER — POTASSIUM CHLORIDE CRYS ER 10 MEQ PO TBCR
40.00 | EXTENDED_RELEASE_TABLET | ORAL | Status: DC
Start: ? — End: 2019-05-16

## 2019-05-16 MED ORDER — EPINEPHRINE 0.3 MG/0.3ML IJ SOAJ
0.30 | INTRAMUSCULAR | Status: DC
Start: ? — End: 2019-05-16

## 2019-05-16 MED ORDER — DIPHENHYDRAMINE HCL 50 MG/ML IJ SOLN
25.00 | INTRAMUSCULAR | Status: DC
Start: ? — End: 2019-05-16

## 2019-05-16 MED ORDER — GENERIC EXTERNAL MEDICATION
7.50 | Status: DC
Start: 2019-05-16 — End: 2019-05-16

## 2019-05-16 MED ORDER — SODIUM CHLORIDE 0.9 % IV SOLN
20.00 | INTRAVENOUS | Status: DC
Start: ? — End: 2019-05-16

## 2019-05-18 ENCOUNTER — Inpatient Hospital Stay: Payer: Medicare Other | Attending: Internal Medicine

## 2019-05-18 ENCOUNTER — Inpatient Hospital Stay: Payer: Medicare Other

## 2019-05-18 ENCOUNTER — Encounter: Payer: Self-pay | Admitting: Internal Medicine

## 2019-05-18 ENCOUNTER — Other Ambulatory Visit: Payer: Self-pay

## 2019-05-18 DIAGNOSIS — N183 Chronic kidney disease, stage 3 unspecified: Secondary | ICD-10-CM | POA: Diagnosis not present

## 2019-05-18 DIAGNOSIS — C8338 Diffuse large B-cell lymphoma, lymph nodes of multiple sites: Secondary | ICD-10-CM | POA: Insufficient documentation

## 2019-05-18 DIAGNOSIS — D72829 Elevated white blood cell count, unspecified: Secondary | ICD-10-CM | POA: Diagnosis not present

## 2019-05-18 DIAGNOSIS — Z79899 Other long term (current) drug therapy: Secondary | ICD-10-CM | POA: Diagnosis not present

## 2019-05-18 DIAGNOSIS — I951 Orthostatic hypotension: Secondary | ICD-10-CM | POA: Diagnosis not present

## 2019-05-18 DIAGNOSIS — C8583 Other specified types of non-Hodgkin lymphoma, intra-abdominal lymph nodes: Secondary | ICD-10-CM

## 2019-05-18 LAB — CBC WITH DIFFERENTIAL/PLATELET
Abs Immature Granulocytes: 0.05 10*3/uL (ref 0.00–0.07)
Basophils Absolute: 0 10*3/uL (ref 0.0–0.1)
Basophils Relative: 1 %
Eosinophils Absolute: 0 10*3/uL (ref 0.0–0.5)
Eosinophils Relative: 1 %
HCT: 22.9 % — ABNORMAL LOW (ref 36.0–46.0)
Hemoglobin: 7.4 g/dL — ABNORMAL LOW (ref 12.0–15.0)
Immature Granulocytes: 1 %
Lymphocytes Relative: 20 %
Lymphs Abs: 1.2 10*3/uL (ref 0.7–4.0)
MCH: 35.9 pg — ABNORMAL HIGH (ref 26.0–34.0)
MCHC: 32.3 g/dL (ref 30.0–36.0)
MCV: 111.2 fL — ABNORMAL HIGH (ref 80.0–100.0)
Monocytes Absolute: 0.4 10*3/uL (ref 0.1–1.0)
Monocytes Relative: 8 %
Neutro Abs: 4.2 10*3/uL (ref 1.7–7.7)
Neutrophils Relative %: 69 %
Platelets: 70 10*3/uL — ABNORMAL LOW (ref 150–400)
RBC: 2.06 MIL/uL — ABNORMAL LOW (ref 3.87–5.11)
RDW: 17.7 % — ABNORMAL HIGH (ref 11.5–15.5)
WBC: 5.9 10*3/uL (ref 4.0–10.5)
nRBC: 0 % (ref 0.0–0.2)

## 2019-05-18 LAB — COMPREHENSIVE METABOLIC PANEL
ALT: 42 U/L (ref 0–44)
AST: 19 U/L (ref 15–41)
Albumin: 3.8 g/dL (ref 3.5–5.0)
Alkaline Phosphatase: 47 U/L (ref 38–126)
Anion gap: 10 (ref 5–15)
BUN: 10 mg/dL (ref 8–23)
CO2: 23 mmol/L (ref 22–32)
Calcium: 8.8 mg/dL — ABNORMAL LOW (ref 8.9–10.3)
Chloride: 103 mmol/L (ref 98–111)
Creatinine, Ser: 1.43 mg/dL — ABNORMAL HIGH (ref 0.44–1.00)
GFR calc Af Amer: 45 mL/min — ABNORMAL LOW (ref >60)
GFR calc non Af Amer: 39 mL/min — ABNORMAL LOW (ref >60)
Glucose, Bld: 95 mg/dL (ref 70–99)
Potassium: 3.9 mmol/L (ref 3.5–5.1)
Sodium: 136 mmol/L (ref 135–145)
Total Bilirubin: 0.9 mg/dL (ref 0.3–1.2)
Total Protein: 5.9 g/dL — ABNORMAL LOW (ref 6.5–8.1)

## 2019-05-18 LAB — SAMPLE TO BLOOD BANK

## 2019-05-18 MED ORDER — PROCARBAZINE HCL 50 MG PO CAPS
100.00 | ORAL_CAPSULE | ORAL | Status: DC
Start: 2019-05-17 — End: 2019-05-18

## 2019-05-18 MED ORDER — PROCHLORPERAZINE MALEATE 10 MG PO TABS
10.00 | ORAL_TABLET | ORAL | Status: DC
Start: ? — End: 2019-05-18

## 2019-05-18 MED ORDER — ACETAMINOPHEN 325 MG PO TABS
650.00 | ORAL_TABLET | ORAL | Status: DC
Start: ? — End: 2019-05-18

## 2019-05-18 MED ORDER — ONDANSETRON HCL 8 MG PO TABS
8.00 | ORAL_TABLET | ORAL | Status: DC
Start: 2019-05-17 — End: 2019-05-18

## 2019-05-18 MED ORDER — PROCHLORPERAZINE EDISYLATE 10 MG/2ML IJ SOLN
10.00 | INTRAMUSCULAR | Status: DC
Start: ? — End: 2019-05-18

## 2019-05-21 ENCOUNTER — Inpatient Hospital Stay: Payer: Medicare Other

## 2019-05-21 ENCOUNTER — Inpatient Hospital Stay: Payer: Medicare Other | Admitting: Internal Medicine

## 2019-05-21 ENCOUNTER — Encounter: Payer: Self-pay | Admitting: Internal Medicine

## 2019-05-22 ENCOUNTER — Encounter: Payer: Self-pay | Admitting: Internal Medicine

## 2019-05-22 ENCOUNTER — Ambulatory Visit: Payer: BLUE CROSS/BLUE SHIELD

## 2019-05-22 ENCOUNTER — Telehealth: Payer: Self-pay | Admitting: Internal Medicine

## 2019-05-22 ENCOUNTER — Other Ambulatory Visit: Payer: BLUE CROSS/BLUE SHIELD

## 2019-05-22 ENCOUNTER — Ambulatory Visit: Payer: BLUE CROSS/BLUE SHIELD | Admitting: Internal Medicine

## 2019-05-22 NOTE — Telephone Encounter (Signed)
On 1/07-spoke to patient/husband-regarding ongoing care at [facility].  Discussed that I am in complete agreement of the recommendations from Dr. Thera Flake.  Will reach out to Dr. Thera Flake.

## 2019-05-25 ENCOUNTER — Telehealth: Payer: Self-pay | Admitting: Internal Medicine

## 2019-05-25 ENCOUNTER — Inpatient Hospital Stay: Payer: Medicare Other | Admitting: Internal Medicine

## 2019-05-25 ENCOUNTER — Inpatient Hospital Stay: Payer: Medicare Other

## 2019-05-25 NOTE — Telephone Encounter (Signed)
On 1/8-spoke to patient husband at length regarding the treatment plan moving forward.  Also reviewed my discussion with Dr. Lambert Mody that I am in agreement with the plan laid out at [facility].  Patient is most likely not to get chemotherapy at this time; awaiting radiation oncology evaluation week of 11th.  Also discussed that although the goal of treatment is cure-unfortunately patient is high risk of relapse/incurable disease.  The chance of cure is quite small and she is unfortunately at a high risk of death from the disease.   Recommendation go to Dr. Eather Colas office regarding follow-up blood work; will cancel appointments at the cancer center on 1/11.  Family will contact us for follow-up.

## 2019-05-26 ENCOUNTER — Encounter: Payer: Self-pay | Admitting: Internal Medicine

## 2019-05-28 ENCOUNTER — Inpatient Hospital Stay: Payer: Medicare Other

## 2019-05-28 ENCOUNTER — Ambulatory Visit: Payer: BLUE CROSS/BLUE SHIELD | Admitting: Internal Medicine

## 2019-07-31 ENCOUNTER — Other Ambulatory Visit: Payer: Self-pay | Admitting: Internal Medicine

## 2019-08-12 LAB — HEPATIC FUNCTION PANEL
ALT: 8 (ref 7–35)
AST: 20 (ref 13–35)
Alkaline Phosphatase: 54 (ref 25–125)
Bilirubin, Total: 0.5

## 2019-08-12 LAB — CBC AND DIFFERENTIAL
HCT: 36 (ref 36–46)
Hemoglobin: 12.3 (ref 12.0–16.0)
Neutrophils Absolute: 4
Platelets: 118 — AB (ref 150–399)
WBC: 4.6

## 2019-08-12 LAB — COMPREHENSIVE METABOLIC PANEL
Albumin: 4.3 (ref 3.5–5.0)
Calcium: 9.6 (ref 8.7–10.7)
GFR calc Af Amer: 71
GFR calc non Af Amer: 61

## 2019-08-12 LAB — BASIC METABOLIC PANEL
BUN: 8 (ref 4–21)
CO2: 28 — AB (ref 13–22)
Chloride: 105 (ref 99–108)
Creatinine: 1 (ref 0.5–1.1)
Glucose: 88
Potassium: 3.9 (ref 3.4–5.3)
Sodium: 138 (ref 137–147)

## 2019-08-12 LAB — CBC: RBC: 3.69 — AB (ref 3.87–5.11)

## 2019-08-20 ENCOUNTER — Encounter: Payer: Self-pay | Admitting: *Deleted

## 2019-08-20 ENCOUNTER — Encounter: Payer: Self-pay | Admitting: Internal Medicine

## 2019-08-20 ENCOUNTER — Other Ambulatory Visit: Payer: Self-pay | Admitting: *Deleted

## 2019-08-20 ENCOUNTER — Telehealth: Payer: Self-pay | Admitting: *Deleted

## 2019-08-20 DIAGNOSIS — C8583 Other specified types of non-Hodgkin lymphoma, intra-abdominal lymph nodes: Secondary | ICD-10-CM

## 2019-08-20 NOTE — Telephone Encounter (Signed)
msg sent on mychart to patient's husband. Dr. Rogue Bussing attempted to reach pt. MD would like to see patient in 1 week with cbc/met/ldh. msg to husband to determine best day for an apt for patient. Lab orders added.

## 2019-08-25 ENCOUNTER — Inpatient Hospital Stay: Payer: Medicare Other | Attending: Internal Medicine

## 2019-08-25 ENCOUNTER — Other Ambulatory Visit: Payer: Self-pay

## 2019-08-25 ENCOUNTER — Inpatient Hospital Stay (HOSPITAL_BASED_OUTPATIENT_CLINIC_OR_DEPARTMENT_OTHER): Payer: Medicare Other | Admitting: Internal Medicine

## 2019-08-25 ENCOUNTER — Encounter: Payer: Self-pay | Admitting: Internal Medicine

## 2019-08-25 DIAGNOSIS — Z807 Family history of other malignant neoplasms of lymphoid, hematopoietic and related tissues: Secondary | ICD-10-CM | POA: Diagnosis not present

## 2019-08-25 DIAGNOSIS — C8583 Other specified types of non-Hodgkin lymphoma, intra-abdominal lymph nodes: Secondary | ICD-10-CM

## 2019-08-25 DIAGNOSIS — Z90710 Acquired absence of both cervix and uterus: Secondary | ICD-10-CM | POA: Diagnosis not present

## 2019-08-25 DIAGNOSIS — Z79899 Other long term (current) drug therapy: Secondary | ICD-10-CM | POA: Diagnosis not present

## 2019-08-25 DIAGNOSIS — E669 Obesity, unspecified: Secondary | ICD-10-CM | POA: Diagnosis not present

## 2019-08-25 DIAGNOSIS — Z8249 Family history of ischemic heart disease and other diseases of the circulatory system: Secondary | ICD-10-CM | POA: Diagnosis not present

## 2019-08-25 DIAGNOSIS — Z808 Family history of malignant neoplasm of other organs or systems: Secondary | ICD-10-CM | POA: Diagnosis not present

## 2019-08-25 DIAGNOSIS — Z800 Family history of malignant neoplasm of digestive organs: Secondary | ICD-10-CM | POA: Insufficient documentation

## 2019-08-25 DIAGNOSIS — C8338 Diffuse large B-cell lymphoma, lymph nodes of multiple sites: Secondary | ICD-10-CM | POA: Insufficient documentation

## 2019-08-25 DIAGNOSIS — Z7952 Long term (current) use of systemic steroids: Secondary | ICD-10-CM | POA: Insufficient documentation

## 2019-08-25 DIAGNOSIS — R5383 Other fatigue: Secondary | ICD-10-CM | POA: Diagnosis not present

## 2019-08-25 DIAGNOSIS — I951 Orthostatic hypotension: Secondary | ICD-10-CM | POA: Diagnosis not present

## 2019-08-25 LAB — CBC WITH DIFFERENTIAL/PLATELET
Abs Immature Granulocytes: 0.01 10*3/uL (ref 0.00–0.07)
Basophils Absolute: 0 10*3/uL (ref 0.0–0.1)
Basophils Relative: 1 %
Eosinophils Absolute: 0.1 10*3/uL (ref 0.0–0.5)
Eosinophils Relative: 3 %
HCT: 37.3 % (ref 36.0–46.0)
Hemoglobin: 12.6 g/dL (ref 12.0–15.0)
Immature Granulocytes: 0 %
Lymphocytes Relative: 11 %
Lymphs Abs: 0.5 10*3/uL — ABNORMAL LOW (ref 0.7–4.0)
MCH: 31.8 pg (ref 26.0–34.0)
MCHC: 33.8 g/dL (ref 30.0–36.0)
MCV: 94.2 fL (ref 80.0–100.0)
Monocytes Absolute: 0.5 10*3/uL (ref 0.1–1.0)
Monocytes Relative: 11 %
Neutro Abs: 3.2 10*3/uL (ref 1.7–7.7)
Neutrophils Relative %: 74 %
Platelets: 127 10*3/uL — ABNORMAL LOW (ref 150–400)
RBC: 3.96 MIL/uL (ref 3.87–5.11)
RDW: 11.9 % (ref 11.5–15.5)
WBC: 4.3 10*3/uL (ref 4.0–10.5)
nRBC: 0 % (ref 0.0–0.2)

## 2019-08-25 LAB — COMPREHENSIVE METABOLIC PANEL
ALT: 12 U/L (ref 0–44)
AST: 18 U/L (ref 15–41)
Albumin: 4.3 g/dL (ref 3.5–5.0)
Alkaline Phosphatase: 45 U/L (ref 38–126)
Anion gap: 12 (ref 5–15)
BUN: 10 mg/dL (ref 8–23)
CO2: 25 mmol/L (ref 22–32)
Calcium: 9.5 mg/dL (ref 8.9–10.3)
Chloride: 103 mmol/L (ref 98–111)
Creatinine, Ser: 1.19 mg/dL — ABNORMAL HIGH (ref 0.44–1.00)
GFR calc Af Amer: 55 mL/min — ABNORMAL LOW (ref >60)
GFR calc non Af Amer: 48 mL/min — ABNORMAL LOW (ref >60)
Glucose, Bld: 142 mg/dL — ABNORMAL HIGH (ref 70–99)
Potassium: 4.4 mmol/L (ref 3.5–5.1)
Sodium: 140 mmol/L (ref 135–145)
Total Bilirubin: 0.8 mg/dL (ref 0.3–1.2)
Total Protein: 6.5 g/dL (ref 6.5–8.1)

## 2019-08-25 LAB — LACTATE DEHYDROGENASE: LDH: 134 U/L (ref 98–192)

## 2019-08-25 NOTE — Progress Notes (Signed)
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 CT- 1-2 CM LN subpectoral/medistinal/ retro-peritoneal/pelvic/ Right inguinal LN 1.6cm- Bx- DLBCL- ABC; myc-POS; FISH gene re-arragement-NEG. PET- MULTIPLE LN/ Bone involvement; R-CHOP x6- CR'# OCT 2017- PET scan- CR; DEC 22nd 2017- BMBx- "atypical large B cells- <1%  [[facility]; II opinion- Dr.Grover- review of path- not concerning for residual lymphoma]; recommend surviellance  # March 26th  2018PET- NED  # Lumbar puncture- difficult-spinal headache. S/p high dose MXT x4.  ------------------------------------------------------------------- # NOV 2018-recurrent diffuse large B-cell lymphoma [right axilla lymph node biopsy proven]; PET- multiple bone lesions; axillary adenopathy; splenomegaly uptake  # Nov 16th 2018- R-; s/p 3 cycles-CR' Auto-Stem cell Transplant date: 07/11/17 [facility]; Dr.Vincent]; May 1st 2019- PET CR.   # OCT 2020- MASS  spanning the corpus callosum. Brain Biopsy  at [facility]-DLBCL. CD20+. EBV -, Ki67+, Pax5+. Port placed 10/27, R-MPV started 10/27 [facility]; Dr.Grover]. s/p WBRT ; MARCH 31st- MRI- remission/Stable disease   ---------------------------------------------------------- # June 2017-Elevated LFTs- valacylovir/diflucan; resolved.   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   # May 2017- EF- 55-65%  #August 2019-double vision; secondary to refractive error-prism improved. 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  She is also status post whole brain radiation at [facility].  The most recent MRI brain August 12, 2019-stable/remission.  Patient unfortunately continues to have significant issues with orthostatic hypotension; currently on midodrine.  She as per the husband recently fell.  She has been working with physical therapy with some improvement.   Patient continues to complain of extreme fatigue.  States that she would "sleep for 24 hours if allowed".  No nausea no vomiting.  No headaches.  Appetite is fair.   As per the husband patient having memory issues; especially short-term.  Also concerned about executive functions.  Review of Systems  Constitutional: Positive for malaise/fatigue. Negative for chills, diaphoresis, fever and weight loss.  HENT: Negative for nosebleeds and sore throat.   Eyes: Negative for double vision.  Respiratory: Negative for cough, hemoptysis, sputum production and wheezing.   Cardiovascular: Negative for chest pain, orthopnea and leg swelling.  Gastrointestinal: Negative for abdominal pain, blood in stool, constipation, diarrhea, heartburn, melena, nausea and vomiting.  Genitourinary: Negative for dysuria, frequency and  urgency.  Musculoskeletal: Negative for back pain and joint pain.  Skin: Negative.  Negative for itching and rash.  Neurological: Positive for dizziness. Negative for tingling, focal  weakness and weakness.  Endo/Heme/Allergies: Does not bruise/bleed easily.  Psychiatric/Behavioral: Positive for memory loss. Negative for depression. The patient does not have insomnia.       PAST MEDICAL HISTORY :  Past Medical History:  Diagnosis Date  . Arthritis   . Blood dyscrasia   . Cancer ([place])   . Chest pain, unspecified   . Diverticulosis 07/06/15  . Dysrhythmia    tachycardia  . Esophagitis 07/06/15  . Fatigue   . Gastritis 07/06/15  . GERD (gastroesophageal reflux disease)   . 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 Leg cramps   . 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 Night sweats   . Osteoporosis   . Overweight(278.02)    Obesity  . Palpitations   . Shortness of breath dyspnea   . Tachycardia   . Thrombocytopenia ([place])     PAST SURGICAL HISTORY :   Past Surgical History:  Procedure Laterality Date  . ABDOMINAL HYSTERECTOMY  2005  . BONE MARROW BIOPSY  09/14/15  . CHOLECYSTECTOMY  1997  . COLONOSCOPY  07/05/2014  . ESOPHAGOGASTRODUODENOSCOPY  07/05/2014  . INGUINAL LYMPH NODE BIOPSY Right 10/05/2015   Procedure: INGUINAL LYMPH NODE BIOPSY;  Surgeon: Christene Lye, MD;  Location: [facility] ORS;  Service: General;  Laterality: Right;  . PERIPHERAL VASCULAR CATHETERIZATION N/A 09/27/2015   Procedure: Glori Luis Cath Insertion;  Surgeon: Algernon Huxley, MD;  Location: [place] CV LAB;  Service: Cardiovascular;  Laterality: N/A;  . PORTA CATH REMOVAL Right 06/03/2017   Procedure: PORTA CATH REMOVAL, with venous thrombectomy;  Surgeon: Algernon Huxley, MD;  Location: [place] CV LAB;  Service: Cardiovascular;  Laterality: Right;  . PORTACATH PLACEMENT Right     FAMILY HISTORY :   Family History  Problem Relation Age of Onset   . Heart disease Father        CABG x 4  . Multiple myeloma Father   . Hypertension Mother   . Pancreatic cancer Mother   . Ulcers Mother   . Asthma Mother   . Brain cancer Maternal Uncle   . Multiple myeloma Maternal Uncle   . Diabetes Neg Hx   . Breast cancer Neg Hx     SOCIAL HISTORY:   Social History   Tobacco Use  . Smoking status: Never Smoker  . Smokeless tobacco: Never Used  Substance Use Topics  . Alcohol use: No    Alcohol/week: 0.0 standard drinks  . Drug use: No    ALLERGIES:  is allergic to ondansetron hcl; oxycodone; and rituximab.  MEDICATIONS:  Current Outpatient Medications  Medication Sig Dispense Refill  . acetaminophen (TYLENOL 8 HOUR) 650 MG CR tablet Take 650 mg by mouth every 8 (eight) hours as needed for pain.    . Carboxymethylcellulose Sodium (THERATEARS) 0.25 % SOLN Apply 1 drop to eye every 4 (four) hours as needed for dry eyes.    Marland Kitchen dexamethasone (DECADRON) 4 MG tablet Take 1 tablet by mouth every 12 (twelve) hours.    Marland Kitchen escitalopram (LEXAPRO) 10 MG tablet TAKE ONE TABLET EVERY DAY 90 tablet 2  . famotidine (PEPCID) 20 MG tablet Take by mouth.    . midodrine (PROAMATINE) 5 MG tablet Take 1 tablet by mouth 3 (three) times daily.    Marland Kitchen nystatin (MYCOSTATIN) 100000 UNIT/ML suspension Take 5 mLs (500,000 Units total) by mouth 4 (four) times daily. 473 mL 0  . ondansetron (ZOFRAN) 4 MG tablet Take  4 mg by mouth every 8 (eight) hours as needed for nausea or vomiting.    . ondansetron (ZOFRAN-ODT) 4 MG disintegrating tablet Take 1 tablet by mouth every 12 (twelve) hours.    . procarbazine (MATULANE) 50 MG capsule Take 4 capsules by mouth daily.    . prochlorperazine (COMPAZINE) 10 MG tablet Take 10 mg by mouth every 6 (six) hours as needed for nausea or vomiting.    . sodium chloride (ALTAMIST SPRAY) 0.65 % nasal spray Place 2 sprays into the nose every 6 (six) hours as needed.    . sulfamethoxazole-trimethoprim (BACTRIM) 400-80 MG tablet 40-80 mg Every  Tuesday Thursday and Saturday    . Tbo-Filgrastim (GRANIX) 300 MCG/0.5ML SOSY injection Inject into the skin.    . valACYclovir (VALTREX) 500 MG tablet Take 1 tablet by mouth daily.    Marland Kitchen XIIDRA 5 % SOLN Place 1 drop into both eyes 2 (two) times daily.     No current facility-administered medications for this visit.   Facility-Administered Medications Ordered in Other Visits  Medication Dose Route Frequency Provider Last Rate Last Admin  . 0.9 %  sodium chloride infusion   Intravenous Continuous Evlyn Kanner, NP   Stopped at 10/14/15 1312  . methotrexate (50 mg/ml) 6.3 g in sodium chloride 0.9 % 1,000 mL injection   Intravenous Once Charlaine Dalton R, MD      . sodium chloride flush (NS) 0.9 % injection 10 mL  10 mL Intravenous PRN Cammie Sickle, MD   10 mL at 10/05/16 1400    PHYSICAL EXAMINATION: ECOG PERFORMANCE STATUS: 0 - Asymptomatic  BP 113/84 (BP Location: Left Arm, Patient Position: Sitting, Cuff Size: Normal)   Pulse 86   Temp (!) 96.2 F (35.7 C) (Tympanic)   Wt 173 lb (78.5 kg)   BMI 31.64 kg/m   Filed Weights   08/25/19 1022  Weight: 173 lb (78.5 kg)   Physical Exam  Constitutional: She is oriented to person, place, and time and well-developed, well-nourished, and in no distress.  Accompanied by husband.  She in a wheelchair.  HENT:  Head: Normocephalic and atraumatic.  Mouth/Throat: Oropharynx is clear and moist. No oropharyngeal exudate.  Eyes: Pupils are equal, round, and reactive to light.  Cardiovascular: Normal rate and regular rhythm.  Pulmonary/Chest: Effort normal and breath sounds normal. No respiratory distress. She has no wheezes.  Abdominal: Soft. Bowel sounds are normal. She exhibits no distension and no mass. There is no abdominal tenderness. There is no rebound and no guarding.  Musculoskeletal:        General: No tenderness or edema. Normal range of motion.     Cervical back: Normal range of motion and neck supple.  Neurological:  She is alert and oriented to person, place, and time.  Skin: Skin is warm.  Psychiatric: Affect normal.    LABORATORY DATA:  I have reviewed the data as listed    Component Value Date/Time   NA 140 08/25/2019 1003   NA 139 06/29/2010 0749   K 4.4 08/25/2019 1003   CL 103 08/25/2019 1003   CO2 25 08/25/2019 1003   GLUCOSE 142 (H) 08/25/2019 1003   BUN 10 08/25/2019 1003   BUN 16 06/29/2010 0749   CREATININE 1.19 (H) 08/25/2019 1003   CALCIUM 9.5 08/25/2019 1003   PROT 6.5 08/25/2019 1003   ALBUMIN 4.3 08/25/2019 1003   AST 18 08/25/2019 1003   ALT 12 08/25/2019 1003   ALKPHOS 45 08/25/2019 1003   BILITOT  0.8 08/25/2019 1003   GFRNONAA 48 (L) 08/25/2019 1003   GFRAA 55 (L) 08/25/2019 1003    No results found for: SPEP, UPEP  Lab Results  Component Value Date   WBC 4.3 08/25/2019   NEUTROABS 3.2 08/25/2019   HGB 12.6 08/25/2019   HCT 37.3 08/25/2019   MCV 94.2 08/25/2019   PLT 127 (L) 08/25/2019      Chemistry      Component Value Date/Time   NA 140 08/25/2019 1003   NA 139 06/29/2010 0749   K 4.4 08/25/2019 1003   CL 103 08/25/2019 1003   CO2 25 08/25/2019 1003   BUN 10 08/25/2019 1003   BUN 16 06/29/2010 0749   CREATININE 1.19 (H) 08/25/2019 1003   GLU 93 06/29/2010 0749      Component Value Date/Time   CALCIUM 9.5 08/25/2019 1003   ALKPHOS 45 08/25/2019 1003   AST 18 08/25/2019 1003   ALT 12 08/25/2019 1003   BILITOT 0.8 08/25/2019 1003       RADIOGRAPHIC STUDIES: I have personally reviewed the radiological images as listed and agreed with the findings in the report. No results found.   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 S/p WBRT- March 31st  2021-MRI- STABLE [[facility]].  Patient has been considered for consolidation cytarabine [see discussion below].  CBC/chemistries within normal limits except for mildly low  platelets in 120s  #Again discussed with the patient and husband regarding the absence of any significant data the benefits of cytarabine consolidation.  However, if patient had best health-this would be one of the best options available.  See below  #Gait instability/orthostatic hypotension/falls-continues to be a problem for the patient; this might be a deterrent ongoing consolidation therapy/aggressive treatment options.  I recommend patient continue work with physical therapy [[facility]].  #Neurocognitive issues/lack of motivation/extreme fatigue-multifactorial [malignancy; chemotherapy radiation]-recommend continue Namenda.  Also encouraged to keep the patient's appointment with neurocognitive assessment.  Discussed that I would be happy to refill her medications.  #Dentist appointment-okay from hematology standpoint.   #Discussed the patient's overall prognosis is guarded at this time; and I will reach out to Dr. Leilani Able regarding treatment plan/future follow-ups.   # DISPOSITION: # follow up in 1 months; MD; labs- cbc/cmp/ldh; port flush-Dr.B     Orders Placed This Encounter  Procedures  . CBC with Differential    Standing Status:   Future    Standing Expiration Date:   08/24/2020  . Comprehensive metabolic panel    Standing Status:   Future    Standing Expiration Date:   08/24/2020  . Lactate dehydrogenase    Standing Status:   Future    Standing Expiration Date:   08/24/2020   All questions were answered. The patient knows to call the clinic with any problems, questions or concerns.      Cammie Sickle, MD 08/26/2019 10:30 AM

## 2019-08-25 NOTE — Assessment & Plan Note (Addendum)
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 S/p WBRT- March 31st  2021-MRI- STABLE [[facility]].  Patient has been considered for consolidation cytarabine [see discussion below].  CBC/chemistries within normal limits except for mildly low platelets in 120s  #Again discussed with the patient and husband regarding the absence of any significant data the benefits of cytarabine consolidation.  However, if patient had best health-this would be one of the best options available.  See below  #Gait instability/orthostatic hypotension/falls-continues to be a problem for the patient; this might be a deterrent ongoing consolidation therapy/aggressive treatment options.  I recommend patient continue work with physical therapy [[facility]].  #Neurocognitive issues/lack of motivation/extreme fatigue-multifactorial [malignancy; chemotherapy radiation]-recommend continue Namenda.  Also encouraged to keep the patient's appointment with neurocognitive assessment.  Discussed that I would be happy to refill her medications.  #Dentist appointment-okay from hematology standpoint.   #Discussed the patient's overall prognosis is guarded at this time; and I will reach out to Dr. Leilani Able regarding treatment plan/future follow-ups.   # DISPOSITION: # follow up in 1 months; MD; labs- cbc/cmp/ldh; port flush-Dr.B

## 2019-08-30 ENCOUNTER — Encounter: Payer: Self-pay | Admitting: Internal Medicine

## 2019-08-31 ENCOUNTER — Telehealth: Payer: Self-pay | Admitting: Internal Medicine

## 2019-08-31 NOTE — Telephone Encounter (Signed)
On 4/16-spoke to patient regarding my discussion with Dr. Thera Flake at [facility].  I think is reasonable to hold off any consolidation therapy given patient's poor performance status/risk of worsening potential side effects.  We will be happy to follow the patient locally.  Encourage the patient to keep the appointment with Dr. Dr. Thera Flake as planned in April.   Patient's husband unavailable.  Patient states that she will inform husband of our discussion.  Follow-up as planned  FYI.

## 2019-09-01 ENCOUNTER — Other Ambulatory Visit: Payer: Self-pay | Admitting: Internal Medicine

## 2019-09-01 ENCOUNTER — Telehealth: Payer: Self-pay | Admitting: Internal Medicine

## 2019-09-01 MED ORDER — MEMANTINE HCL 10 MG PO TABS: 10.0000 mg | ORAL_TABLET | Freq: Two times a day (BID) | ORAL | 3 refills | Status: DC

## 2019-09-01 NOTE — Telephone Encounter (Signed)
On 4/19-spoke to patient's husband regarding discussion with Dr. Thera Flake.  Recommend holding consolidation cytarabine therapy given patient's poor performance status risk of falls.  Will refill Namenda.  Recommend follow up with [facility] as planned; patient's husband to call to reschedule appointment with [place] if needed.    FYI

## 2019-09-01 NOTE — Progress Notes (Signed)
Reviewed the [facility] chart; refilled Namenda 10 mg twice daily.

## 2019-09-14 ENCOUNTER — Encounter: Payer: Self-pay | Admitting: Internal Medicine

## 2019-09-18 ENCOUNTER — Other Ambulatory Visit: Payer: Self-pay

## 2019-09-22 ENCOUNTER — Other Ambulatory Visit: Payer: Self-pay

## 2019-09-22 ENCOUNTER — Inpatient Hospital Stay (HOSPITAL_BASED_OUTPATIENT_CLINIC_OR_DEPARTMENT_OTHER): Payer: Medicare Other | Admitting: Internal Medicine

## 2019-09-22 ENCOUNTER — Inpatient Hospital Stay: Payer: Medicare Other | Attending: Internal Medicine

## 2019-09-22 ENCOUNTER — Encounter: Payer: Self-pay | Admitting: Internal Medicine

## 2019-09-22 VITALS — BP 140/84 | HR 77 | Temp 96.8°F | Resp 20 | Ht 62.0 in | Wt 173.0 lb

## 2019-09-22 DIAGNOSIS — C8599 Non-Hodgkin lymphoma, unspecified, extranodal and solid organ sites: Secondary | ICD-10-CM

## 2019-09-22 DIAGNOSIS — Z90710 Acquired absence of both cervix and uterus: Secondary | ICD-10-CM | POA: Diagnosis not present

## 2019-09-22 DIAGNOSIS — Z800 Family history of malignant neoplasm of digestive organs: Secondary | ICD-10-CM | POA: Insufficient documentation

## 2019-09-22 DIAGNOSIS — Z79899 Other long term (current) drug therapy: Secondary | ICD-10-CM | POA: Insufficient documentation

## 2019-09-22 DIAGNOSIS — Z8249 Family history of ischemic heart disease and other diseases of the circulatory system: Secondary | ICD-10-CM | POA: Insufficient documentation

## 2019-09-22 DIAGNOSIS — E669 Obesity, unspecified: Secondary | ICD-10-CM | POA: Diagnosis not present

## 2019-09-22 DIAGNOSIS — C8338 Diffuse large B-cell lymphoma, lymph nodes of multiple sites: Secondary | ICD-10-CM | POA: Diagnosis present

## 2019-09-22 DIAGNOSIS — Z808 Family history of malignant neoplasm of other organs or systems: Secondary | ICD-10-CM | POA: Diagnosis not present

## 2019-09-22 DIAGNOSIS — Z807 Family history of other malignant neoplasms of lymphoid, hematopoietic and related tissues: Secondary | ICD-10-CM | POA: Insufficient documentation

## 2019-09-22 DIAGNOSIS — C8583 Other specified types of non-Hodgkin lymphoma, intra-abdominal lymph nodes: Secondary | ICD-10-CM

## 2019-09-22 DIAGNOSIS — I951 Orthostatic hypotension: Secondary | ICD-10-CM | POA: Insufficient documentation

## 2019-09-22 DIAGNOSIS — R2689 Other abnormalities of gait and mobility: Secondary | ICD-10-CM | POA: Insufficient documentation

## 2019-09-22 LAB — CBC WITH DIFFERENTIAL/PLATELET
Abs Immature Granulocytes: 0.03 10*3/uL (ref 0.00–0.07)
Basophils Absolute: 0 10*3/uL (ref 0.0–0.1)
Basophils Relative: 1 %
Eosinophils Absolute: 0.1 10*3/uL (ref 0.0–0.5)
Eosinophils Relative: 2 %
HCT: 33.6 % — ABNORMAL LOW (ref 36.0–46.0)
Hemoglobin: 11.6 g/dL — ABNORMAL LOW (ref 12.0–15.0)
Immature Granulocytes: 1 %
Lymphocytes Relative: 11 %
Lymphs Abs: 0.6 10*3/uL — ABNORMAL LOW (ref 0.7–4.0)
MCH: 32 pg (ref 26.0–34.0)
MCHC: 34.5 g/dL (ref 30.0–36.0)
MCV: 92.6 fL (ref 80.0–100.0)
Monocytes Absolute: 0.5 10*3/uL (ref 0.1–1.0)
Monocytes Relative: 10 %
Neutro Abs: 3.9 10*3/uL (ref 1.7–7.7)
Neutrophils Relative %: 75 %
Platelets: 124 10*3/uL — ABNORMAL LOW (ref 150–400)
RBC: 3.63 MIL/uL — ABNORMAL LOW (ref 3.87–5.11)
RDW: 12.5 % (ref 11.5–15.5)
WBC: 5.1 10*3/uL (ref 4.0–10.5)
nRBC: 0 % (ref 0.0–0.2)

## 2019-09-22 LAB — LACTATE DEHYDROGENASE: LDH: 121 U/L (ref 98–192)

## 2019-09-22 LAB — COMPREHENSIVE METABOLIC PANEL
ALT: 17 U/L (ref 0–44)
AST: 18 U/L (ref 15–41)
Albumin: 4.2 g/dL (ref 3.5–5.0)
Alkaline Phosphatase: 54 U/L (ref 38–126)
Anion gap: 9 (ref 5–15)
BUN: 20 mg/dL (ref 8–23)
CO2: 26 mmol/L (ref 22–32)
Calcium: 9.5 mg/dL (ref 8.9–10.3)
Chloride: 105 mmol/L (ref 98–111)
Creatinine, Ser: 1.13 mg/dL — ABNORMAL HIGH (ref 0.44–1.00)
GFR calc Af Amer: 59 mL/min — ABNORMAL LOW (ref >60)
GFR calc non Af Amer: 51 mL/min — ABNORMAL LOW (ref >60)
Glucose, Bld: 127 mg/dL — ABNORMAL HIGH (ref 70–99)
Potassium: 3.9 mmol/L (ref 3.5–5.1)
Sodium: 140 mmol/L (ref 135–145)
Total Bilirubin: 0.9 mg/dL (ref 0.3–1.2)
Total Protein: 6.4 g/dL — ABNORMAL LOW (ref 6.5–8.1)

## 2019-09-22 NOTE — Progress Notes (Signed)
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 CT- 1-2 CM LN subpectoral/medistinal/ retro-peritoneal/pelvic/ Right inguinal LN 1.6cm- Bx- DLBCL- ABC; myc-POS; FISH gene re-arragement-NEG. PET- MULTIPLE LN/ Bone involvement; R-CHOP x6- CR'# OCT 2017- PET scan- CR; DEC 22nd 2017- BMBx- "atypical large B cells- <1%  [[facility]; II opinion- Dr.Grover- review of path- not concerning for residual lymphoma]; recommend surviellance  # March 26th  2018PET- NED  # Lumbar puncture- difficult-spinal headache. S/p high dose MXT x4.  ------------------------------------------------------------------- # NOV 2018-recurrent diffuse large B-cell lymphoma [right axilla lymph node biopsy proven]; PET- multiple bone lesions; axillary adenopathy; splenomegaly uptake  # Nov 16th 2018- R-; s/p 3 cycles-CR' Auto-Stem cell Transplant date: 07/11/17 [facility]; Dr.Vincent]; May 1st 2019- PET CR.   # OCT 2020- MASS  spanning the corpus callosum. Brain Biopsy  at [facility]-DLBCL. CD20+. EBV -, Ki67+, Pax5+. Port placed 10/27, R-MPV started 10/27 [facility]; Dr.Grover]. s/p WBRT ; MARCH 31st- MRI- remission/Stable disease   ---------------------------------------------------------- # June 2017-Elevated LFTs- valacylovir/diflucan; resolved.   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   # May 2017- EF- 55-65%  #August 2019-double vision; secondary to refractive error-prism improved. 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   The interim patient was evaluated at [facility] consideration of consolidation cytarabine therapy.  Given patient borderline performance status/it was not offered.  Patient is currently at home.  She continues to have episodes of dizzy spells/low blood pressure when standing; however currently stable.  No falls.  She has undergone significant physical therapy with moderate improvement.   Review of Systems  Constitutional: Positive for malaise/fatigue. Negative for chills, diaphoresis, fever and weight loss.  HENT: Negative for nosebleeds and sore throat.   Eyes: Negative for double vision.  Respiratory: Negative for cough, hemoptysis, sputum production and wheezing.   Cardiovascular: Negative for chest pain, orthopnea and leg swelling.  Gastrointestinal: Negative for abdominal pain, blood in stool, constipation, diarrhea, heartburn, melena, nausea and vomiting.  Genitourinary: Negative for dysuria, frequency and urgency.  Musculoskeletal: Negative for back pain and joint pain.  Skin: Negative.  Negative for itching and rash.  Neurological: Positive for dizziness. Negative for tingling, focal  weakness and weakness.  Endo/Heme/Allergies: Does not bruise/bleed easily.  Psychiatric/Behavioral: Positive for memory loss. Negative for depression. The patient does not have insomnia.       PAST MEDICAL HISTORY :  Past Medical History:  Diagnosis Date  . Arthritis   . Blood dyscrasia   . Cancer ([place])   . Chest pain, unspecified   . Diverticulosis 07/06/15  . Dysrhythmia    tachycardia  . Esophagitis 07/06/15  . Fatigue   . Gastritis 07/06/15  . GERD (gastroesophageal reflux disease)   . 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 Leg cramps   . 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 Night sweats   . Osteoporosis   . Overweight(278.02)    Obesity  . Palpitations   . Shortness of breath dyspnea   . Tachycardia   . Thrombocytopenia ([place])     PAST SURGICAL HISTORY :   Past Surgical History:  Procedure Laterality Date  . ABDOMINAL HYSTERECTOMY  2005  . BONE MARROW BIOPSY  09/14/15  . CHOLECYSTECTOMY  1997  . COLONOSCOPY  07/05/2014  . ESOPHAGOGASTRODUODENOSCOPY  07/05/2014  . INGUINAL LYMPH NODE BIOPSY Right 10/05/2015   Procedure: INGUINAL LYMPH NODE BIOPSY;  Surgeon: Christene Lye, MD;  Location: [facility] ORS;  Service: General;  Laterality: Right;  . PERIPHERAL VASCULAR CATHETERIZATION N/A 09/27/2015   Procedure: Glori Luis Cath Insertion;  Surgeon: Algernon Huxley, MD;  Location: [place] [facility];  Service: Cardiovascular;  Laterality: N/A;  . PORTA CATH REMOVAL Right 06/03/2017   Procedure: PORTA CATH REMOVAL, with venous thrombectomy;  Surgeon: Algernon Huxley, MD;  Location: [place] CV [facility];  Service: Cardiovascular;  Laterality: Right;  . PORTACATH PLACEMENT Right     FAMILY HISTORY :   Family History  Problem Relation Age of Onset  . Heart disease Father        CABG x 4  . Multiple myeloma Father   . Hypertension Mother   . Pancreatic cancer Mother   . Ulcers Mother   . Asthma Mother   . Brain cancer  Maternal Uncle   . Multiple myeloma Maternal Uncle   . Diabetes Neg Hx   . Breast cancer Neg Hx     SOCIAL HISTORY:   Social History   Tobacco Use  . Smoking status: Never Smoker  . Smokeless tobacco: Never Used  Substance Use Topics  . Alcohol use: No    Alcohol/week: 0.0 standard drinks  . Drug use: No    ALLERGIES:  is allergic to ondansetron hcl; oxycodone; and rituximab.  MEDICATIONS:  Current Outpatient Medications  Medication Sig Dispense Refill  . acetaminophen (TYLENOL 8 HOUR) 650 MG CR tablet Take 650 mg by mouth every 8 (eight) hours as needed for pain.    Marland Kitchen escitalopram (LEXAPRO) 10 MG tablet TAKE ONE TABLET EVERY DAY 90 tablet 2  . memantine (NAMENDA) 10 MG tablet Take 1 tablet (10 mg total) by mouth 2 (two) times daily. 60 tablet 3  . midodrine (PROAMATINE) 5 MG tablet Take 1 tablet by mouth 3 (three) times daily.    . ondansetron (ZOFRAN) 4 MG tablet Take 4 mg by mouth every 8 (eight) hours as needed for nausea or vomiting.    . ondansetron (ZOFRAN-ODT) 4 MG disintegrating tablet Take 1 tablet by mouth every 12 (twelve) hours.    . prochlorperazine (COMPAZINE) 10 MG tablet Take 10 mg by mouth every 6 (six) hours as needed for nausea or vomiting.    . sodium chloride (ALTAMIST SPRAY) 0.65 % nasal spray Place 2 sprays into the nose every 6 (six) hours as needed.    Marland Kitchen XIIDRA 5 % SOLN Place 1 drop into  both eyes 2 (two) times daily.     No current facility-administered medications for this visit.   Facility-Administered Medications Ordered in Other Visits  Medication Dose Route Frequency Provider Last Rate Last Admin  . 0.9 %  sodium chloride infusion   Intravenous Continuous Evlyn Kanner, NP   Stopped at 10/14/15 1312  . methotrexate (50 mg/ml) 6.3 g in sodium chloride 0.9 % 1,000 mL injection   Intravenous Once Charlaine Dalton R, MD      . sodium chloride flush (NS) 0.9 % injection 10 mL  10 mL Intravenous PRN Cammie Sickle, MD   10 mL at 10/05/16  1400    PHYSICAL EXAMINATION: ECOG PERFORMANCE STATUS: 0 - Asymptomatic  BP 140/84   Pulse 77   Temp (!) 96.8 F (36 C) (Tympanic)   Resp 20   Ht 5' 2"  (1.575 m)   Wt 173 lb (78.5 kg)   BMI 31.64 kg/m   Filed Weights   09/22/19 1442  Weight: 173 lb (78.5 kg)   Physical Exam  Constitutional: She is oriented to person, place, and time and well-developed, well-nourished, and in no distress.  Accompanied by husband.  She in a wheelchair.  HENT:  Head: Normocephalic and atraumatic.  Mouth/Throat: Oropharynx is clear and moist. No oropharyngeal exudate.  Eyes: Pupils are equal, round, and reactive to light.  Cardiovascular: Normal rate and regular rhythm.  Pulmonary/Chest: Effort normal and breath sounds normal. No respiratory distress. She has no wheezes.  Abdominal: Soft. Bowel sounds are normal. She exhibits no distension and no mass. There is no abdominal tenderness. There is no rebound and no guarding.  Musculoskeletal:        General: No tenderness or edema. Normal range of motion.     Cervical back: Normal range of motion and neck supple.  Neurological: She is alert and oriented to person, place, and time.  Skin: Skin is warm.  Psychiatric: Affect normal.    LABORATORY DATA:  I have reviewed the data as listed    Component Value Date/Time   NA 140 09/22/2019 1418   NA 138 08/12/2019 0000   K 3.9 09/22/2019 1418   CL 105 09/22/2019 1418   CO2 26 09/22/2019 1418   GLUCOSE 127 (H) 09/22/2019 1418   BUN 20 09/22/2019 1418   BUN 8 08/12/2019 0000   CREATININE 1.13 (H) 09/22/2019 1418   CALCIUM 9.5 09/22/2019 1418   PROT 6.4 (L) 09/22/2019 1418   ALBUMIN 4.2 09/22/2019 1418   AST 18 09/22/2019 1418   ALT 17 09/22/2019 1418   ALKPHOS 54 09/22/2019 1418   BILITOT 0.9 09/22/2019 1418   GFRNONAA 51 (L) 09/22/2019 1418   GFRAA 59 (L) 09/22/2019 1418    No results found for: SPEP, UPEP  Lab Results  Component Value Date   WBC 5.1 09/22/2019   NEUTROABS 3.9  09/22/2019   HGB 11.6 (L) 09/22/2019   HCT 33.6 (L) 09/22/2019   MCV 92.6 09/22/2019   PLT 124 (L) 09/22/2019      Chemistry      Component Value Date/Time   NA 140 09/22/2019 1418   NA 138 08/12/2019 0000   K 3.9 09/22/2019 1418   CL 105 09/22/2019 1418   CO2 26 09/22/2019 1418   BUN 20 09/22/2019 1418   BUN 8 08/12/2019 0000   CREATININE 1.13 (H) 09/22/2019 1418   GLU 88 08/12/2019 0000      Component Value Date/Time   CALCIUM 9.5 09/22/2019 1418  ALKPHOS 54 09/22/2019 1418   AST 18 09/22/2019 1418   ALT 17 09/22/2019 1418   BILITOT 0.9 09/22/2019 1418       RADIOGRAPHIC STUDIES: I have personally reviewed the radiological images as listed and agreed with the findings in the report. No results found.   ASSESSMENT & PLAN:  DLBCL (diffuse large B cell lymphoma) (HCC)     Secondary non-EBV mediated lymphoma of brain (HCC) #Recurrent diffuse large cell lymphoma of the brain-sp  R-MPV's [with intrathecal cytarabine]. S/p WBRT- March 31st  2021-MRI- STABLE [[facility]].  Declined consolidation therapy/borderline performance status; more risk versus benefits.  #Clinically stable; plan repeat MRI in July 2 week 2021.  #Gait instability/orthostatic hypotension-s/p physical therapy; overall stable.  #Neurocognitive issues/lack of motivation/extreme fatigue-multifactorial [malignancy; chemotherapy radiation]-recommend continue Namenda. Stable.    # DISPOSITION: # follow up in 2nd week of July 2021- MD; labs- cbc/cmp/ldh; port flush; MRI brain-prior- Dr.B   Orders Placed This Encounter  Procedures  . MR Brain W Wo Contrast    Standing Status:   Future    Standing Expiration Date:   09/21/2020    Order Specific Question:   ** REASON FOR EXAM (FREE TEXT)    Answer:   lymphoma of brain    Order Specific Question:   If indicated for the ordered procedure, I authorize the administration of contrast media per Radiology protocol    Answer:   Yes    Order Specific Question:    What is the patient's sedation requirement?    Answer:   No Sedation    Order Specific Question:   Does the patient have a pacemaker or implanted devices?    Answer:   No    Order Specific Question:   Use SRS Protocol?    Answer:   No    Order Specific Question:   Radiology Contrast Protocol - do NOT remove file path    Answer:   [email or web address]    Order Specific Question:   Preferred imaging location?    Answer:   [facility] (table limit - 550lbs)   All questions were answered. The patient knows to call the clinic with any problems, questions or concerns.      Cammie Sickle, MD 10/07/2019 5:12 PM

## 2019-10-07 NOTE — Assessment & Plan Note (Addendum)
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 S/p WBRT- March 31st  2021-MRI- STABLE [[facility]].  Declined consolidation therapy/borderline performance status; more risk versus benefits.  #Clinically stable; plan repeat MRI in July 2 week 2021.  #Gait instability/orthostatic hypotension-s/p physical therapy; overall stable.  #Neurocognitive issues/lack of motivation/extreme fatigue-multifactorial [malignancy; chemotherapy radiation]-recommend continue Namenda. Stable.    # DISPOSITION: # follow up in 2nd week of July 2021- MD; labs- cbc/cmp/ldh; port flush; MRI brain-prior- Dr.B

## 2019-11-06 ENCOUNTER — Other Ambulatory Visit: Payer: Self-pay

## 2019-11-06 ENCOUNTER — Ambulatory Visit
Admission: RE | Admit: 2019-11-06 | Discharge: 2019-11-06 | Disposition: A | Discharge status: Home / Self Care | Payer: Medicare Other | Source: Ambulatory Visit | Attending: Internal Medicine | Admitting: Internal Medicine

## 2019-11-06 DIAGNOSIS — C8599 Non-Hodgkin lymphoma, unspecified, extranodal and solid organ sites: Secondary | ICD-10-CM | POA: Diagnosis not present

## 2019-11-06 MED ORDER — GADOBUTROL 1 MMOL/ML IV SOLN
7.5000 mL | Freq: Once | INTRAVENOUS | Status: AC | PRN
  Administered 2019-11-06: 7.5 mL via INTRAVENOUS

## 2019-11-23 ENCOUNTER — Other Ambulatory Visit: Payer: Self-pay | Admitting: *Deleted

## 2019-11-23 DIAGNOSIS — C8583 Other specified types of non-Hodgkin lymphoma, intra-abdominal lymph nodes: Secondary | ICD-10-CM

## 2019-11-24 ENCOUNTER — Inpatient Hospital Stay (HOSPITAL_BASED_OUTPATIENT_CLINIC_OR_DEPARTMENT_OTHER): Payer: Medicare Other | Admitting: Internal Medicine

## 2019-11-24 ENCOUNTER — Encounter: Payer: Self-pay | Admitting: Internal Medicine

## 2019-11-24 ENCOUNTER — Inpatient Hospital Stay: Payer: Medicare Other | Attending: Internal Medicine

## 2019-11-24 ENCOUNTER — Other Ambulatory Visit: Payer: Self-pay

## 2019-11-24 DIAGNOSIS — Z79899 Other long term (current) drug therapy: Secondary | ICD-10-CM | POA: Diagnosis not present

## 2019-11-24 DIAGNOSIS — I951 Orthostatic hypotension: Secondary | ICD-10-CM | POA: Insufficient documentation

## 2019-11-24 DIAGNOSIS — C8583 Other specified types of non-Hodgkin lymphoma, intra-abdominal lymph nodes: Secondary | ICD-10-CM

## 2019-11-24 DIAGNOSIS — C8338 Diffuse large B-cell lymphoma, lymph nodes of multiple sites: Secondary | ICD-10-CM | POA: Diagnosis present

## 2019-11-24 DIAGNOSIS — R2689 Other abnormalities of gait and mobility: Secondary | ICD-10-CM | POA: Diagnosis not present

## 2019-11-24 DIAGNOSIS — C8599 Non-Hodgkin lymphoma, unspecified, extranodal and solid organ sites: Secondary | ICD-10-CM | POA: Diagnosis not present

## 2019-11-24 LAB — CBC WITH DIFFERENTIAL/PLATELET
Abs Immature Granulocytes: 0.02 10*3/uL (ref 0.00–0.07)
Basophils Absolute: 0 10*3/uL (ref 0.0–0.1)
Basophils Relative: 1 %
Eosinophils Absolute: 0.1 10*3/uL (ref 0.0–0.5)
Eosinophils Relative: 1 %
HCT: 33.9 % — ABNORMAL LOW (ref 36.0–46.0)
Hemoglobin: 12 g/dL (ref 12.0–15.0)
Immature Granulocytes: 0 %
Lymphocytes Relative: 11 %
Lymphs Abs: 0.6 10*3/uL — ABNORMAL LOW (ref 0.7–4.0)
MCH: 31.4 pg (ref 26.0–34.0)
MCHC: 35.4 g/dL (ref 30.0–36.0)
MCV: 88.7 fL (ref 80.0–100.0)
Monocytes Absolute: 0.6 10*3/uL (ref 0.1–1.0)
Monocytes Relative: 10 %
Neutro Abs: 4 10*3/uL (ref 1.7–7.7)
Neutrophils Relative %: 77 %
Platelets: 117 10*3/uL — ABNORMAL LOW (ref 150–400)
RBC: 3.82 MIL/uL — ABNORMAL LOW (ref 3.87–5.11)
RDW: 12.6 % (ref 11.5–15.5)
WBC: 5.3 10*3/uL (ref 4.0–10.5)
nRBC: 0 % (ref 0.0–0.2)

## 2019-11-24 LAB — COMPREHENSIVE METABOLIC PANEL
ALT: 21 U/L (ref 0–44)
AST: 22 U/L (ref 15–41)
Albumin: 4.2 g/dL (ref 3.5–5.0)
Alkaline Phosphatase: 68 U/L (ref 38–126)
Anion gap: 8 (ref 5–15)
BUN: 18 mg/dL (ref 8–23)
CO2: 26 mmol/L (ref 22–32)
Calcium: 9.1 mg/dL (ref 8.9–10.3)
Chloride: 105 mmol/L (ref 98–111)
Creatinine, Ser: 1.04 mg/dL — ABNORMAL HIGH (ref 0.44–1.00)
GFR calc Af Amer: >60 mL/min (ref >60)
GFR calc non Af Amer: 56 mL/min — ABNORMAL LOW (ref >60)
Glucose, Bld: 99 mg/dL (ref 70–99)
Potassium: 3.7 mmol/L (ref 3.5–5.1)
Sodium: 139 mmol/L (ref 135–145)
Total Bilirubin: 0.7 mg/dL (ref 0.3–1.2)
Total Protein: 6.2 g/dL — ABNORMAL LOW (ref 6.5–8.1)

## 2019-11-24 LAB — LACTATE DEHYDROGENASE: LDH: 116 U/L (ref 98–192)

## 2019-11-24 MED ORDER — SODIUM CHLORIDE 0.9% FLUSH
10.0000 mL | Freq: Once | INTRAVENOUS | Status: AC
  Administered 2019-11-24: 10 mL via INTRAVENOUS
  Filled 2019-11-24: qty 10

## 2019-11-24 MED ORDER — HEPARIN SOD (PORK) LOCK FLUSH 100 UNIT/ML IV SOLN
500.0000 [IU] | Freq: Once | INTRAVENOUS | Status: AC
  Administered 2019-11-24: 500 [IU] via INTRAVENOUS
  Filled 2019-11-24: qty 5

## 2019-11-24 NOTE — Assessment & Plan Note (Addendum)
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 S/p WBRT; July st,2021-MRI-posttreatment changes; no obvious evidence of recurrence.  Continue surveillance with imaging of the 4-6 months.  We will decide at next visit  #Gait instability/orthostatic hypotension-s/p physical therapy; continue midodrine; stable  #Neurocognitive issues/lack of motivation/extreme fatigue-multifactorial [malignancy; chemotherapy radiation]-stable  # Vaccinations- recommend calling [facility] re: Vaccinations; as per recommendations would schedule locally.  # DISPOSITION: #Follow up in 3 months MD; labs- cbc/cmp/ldh; port flush;- Dr.B

## 2019-11-24 NOTE — Progress Notes (Signed)
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 CT- 1-2 CM LN subpectoral/medistinal/ retro-peritoneal/pelvic/ Right inguinal LN 1.6cm- Bx- DLBCL- ABC; myc-POS; FISH gene re-arragement-NEG. PET- MULTIPLE LN/ Bone involvement; R-CHOP x6- CR'# OCT 2017- PET scan- CR; DEC 22nd 2017- BMBx- "atypical large B cells- <1%  [[facility]; II opinion- Dr.Grover- review of path- not concerning for residual lymphoma]; recommend surviellance  # March 26th  2018PET- NED  # Lumbar puncture- difficult-spinal headache. S/p high dose MXT x4.  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   # OCT 2020- MASS  spanning the corpus callosum. Brain Biopsy  at [facility]-DLBCL. CD20+. EBV -, Ki67+, Pax5+. Port placed 10/27, R-MPV started 10/27 [facility]; Dr.Grover]. s/p WBRT ; MARCH 31st- MRI- remission/Stable disease   ---------------------------------------------------------- # June 2017-Elevated LFTs- valacylovir/diflucan; resolved.   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   # May 2017- EF- 55-65%  #August 2019-double vision; secondary to refractive error-prism improved. 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   Patient was evaluated with radiation oncology [facility]; on-line Consultation.  Her blood pressure still drops on standing.  Follow-up status post physical therapy she has noted to have moderate improvement.  No falls.  Complains of memory/neurocognitive issues.  No headaches.  No nausea no vomiting.   Review of Systems  Constitutional: Positive for malaise/fatigue. Negative for chills, diaphoresis, fever and weight loss.  HENT: Negative for nosebleeds and sore throat.   Eyes: Negative for double vision.  Respiratory: Negative for cough, hemoptysis, sputum production and wheezing.   Cardiovascular: Negative for chest pain, orthopnea and leg swelling.  Gastrointestinal: Negative for abdominal pain, blood in stool, constipation, diarrhea, heartburn, melena, nausea and vomiting.  Genitourinary: Negative for dysuria, frequency and urgency.  Musculoskeletal: Negative for back pain and joint pain.  Skin: Negative.  Negative for itching and rash.  Neurological: Positive for dizziness. Negative for tingling, focal weakness and weakness.  Endo/Heme/Allergies: Does not bruise/bleed easily.   Psychiatric/Behavioral: Positive for memory loss. Negative for depression. The patient does not have insomnia.       PAST MEDICAL HISTORY :  Past Medical History:  Diagnosis Date  . Arthritis   .  Blood dyscrasia   . Cancer ([place])   . Chest pain, unspecified   . Diverticulosis 07/06/15  . Dysrhythmia    tachycardia  . Esophagitis 07/06/15  . Fatigue   . Gastritis 07/06/15  . GERD (gastroesophageal reflux disease)   . 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 Leg cramps   . Lymphoma ([place]) 09/14/15   Monoclonal B cell lymphoma  . Night sweats   . Osteoporosis   . Overweight(278.02)    Obesity  . Palpitations   . Shortness of breath dyspnea   . Tachycardia   . Thrombocytopenia ([place])     PAST SURGICAL HISTORY :   Past Surgical History:  Procedure Laterality Date  . ABDOMINAL HYSTERECTOMY  2005  . BONE MARROW BIOPSY  09/14/15  . CHOLECYSTECTOMY  1997  . COLONOSCOPY  07/05/2014  . ESOPHAGOGASTRODUODENOSCOPY  07/05/2014  . INGUINAL LYMPH NODE BIOPSY Right 10/05/2015   Procedure: INGUINAL LYMPH NODE BIOPSY;  Surgeon: Christene Lye, MD;  Location: [facility] ORS;  Service: General;  Laterality: Right;  . PERIPHERAL VASCULAR CATHETERIZATION N/A 09/27/2015   Procedure: Glori Luis Cath Insertion;  Surgeon: Algernon Huxley, MD;  Location: [place] [facility];  Service: Cardiovascular;  Laterality: N/A;  . PORTA CATH REMOVAL Right 06/03/2017   Procedure: PORTA CATH REMOVAL, with venous thrombectomy;  Surgeon: Algernon Huxley, MD;  Location: [place] CV LAB;  Service: Cardiovascular;  Laterality: Right;  . PORTACATH PLACEMENT Right     FAMILY HISTORY :   Family History  Problem Relation Age of Onset  . Heart disease Father        CABG x 4  . Multiple myeloma Father   . Hypertension Mother   . Pancreatic cancer Mother   . Ulcers Mother   . Asthma Mother   . Brain cancer Maternal Uncle   . Multiple myeloma Maternal Uncle   . Diabetes Neg Hx   .  Breast cancer Neg Hx     SOCIAL HISTORY:   Social History   Tobacco Use  . Smoking status: Never Smoker  . Smokeless tobacco: Never Used  Vaping Use  . Vaping Use: Never used  Substance Use Topics  . Alcohol use: No    Alcohol/week: 0.0 standard drinks  . Drug use: No    ALLERGIES:  is allergic to ondansetron hcl, oxycodone, and rituximab.  MEDICATIONS:  Current Outpatient Medications  Medication Sig Dispense Refill  . acetaminophen (TYLENOL 8 HOUR) 650 MG CR tablet Take 650 mg by mouth every 8 (eight) hours as needed for pain.    Marland Kitchen escitalopram (LEXAPRO) 10 MG tablet TAKE ONE TABLET EVERY DAY 90 tablet 2  . memantine (NAMENDA) 10 MG tablet Take 1 tablet (10 mg total) by mouth 2 (two) times daily. 60 tablet 3  . midodrine (PROAMATINE) 5 MG tablet Take 1 tablet by mouth 3 (three) times daily.    . ondansetron (ZOFRAN) 4 MG tablet Take 4 mg by mouth every 8 (eight) hours as needed for nausea or vomiting.    . ondansetron (ZOFRAN-ODT) 4 MG disintegrating tablet Take 1 tablet by mouth every 12 (twelve) hours.    . sodium chloride (ALTAMIST SPRAY) 0.65 % nasal spray Place 2 sprays into the nose every 6 (six) hours as needed.     No current facility-administered medications for this visit.   Facility-Administered Medications Ordered in Other Visits  Medication Dose Route Frequency Provider Last Rate Last Admin  . 0.9 %  sodium  chloride infusion   Intravenous Continuous Evlyn Kanner, NP   Stopped at 10/14/15 1312  . methotrexate (50 mg/ml) 6.3 g in sodium chloride 0.9 % 1,000 mL injection   Intravenous Once Charlaine Dalton R, MD      . sodium chloride flush (NS) 0.9 % injection 10 mL  10 mL Intravenous PRN Cammie Sickle, MD   10 mL at 10/05/16 1400    PHYSICAL EXAMINATION: ECOG PERFORMANCE STATUS: 0 - Asymptomatic  BP 117/80 (BP Location: Left Arm, Patient Position: Sitting, Cuff Size: Normal)   Pulse 65   Temp 97.7 F (36.5 C) (Tympanic)   Resp 16   Wt 171 lb  3.2 oz (77.7 kg)   SpO2 99%   BMI 31.31 kg/m   Filed Weights   11/24/19 1510  Weight: 171 lb 3.2 oz (77.7 kg)   Physical Exam Constitutional:      Comments: Ambulating independently.  Accompanied by husband.    HENT:     Head: Normocephalic and atraumatic.     Mouth/Throat:     Pharynx: No oropharyngeal exudate.  Eyes:     Pupils: Pupils are equal, round, and reactive to light.  Cardiovascular:     Rate and Rhythm: Normal rate and regular rhythm.  Pulmonary:     Effort: Pulmonary effort is normal. No respiratory distress.     Breath sounds: Normal breath sounds. No wheezing.  Abdominal:     General: Bowel sounds are normal. There is no distension.     Palpations: Abdomen is soft. There is no mass.     Tenderness: There is no abdominal tenderness. There is no guarding or rebound.  Musculoskeletal:        General: No tenderness. Normal range of motion.     Cervical back: Normal range of motion and neck supple.  Skin:    General: Skin is warm.  Neurological:     Mental Status: She is alert and oriented to person, place, and time.  Psychiatric:        Mood and Affect: Affect normal.     LABORATORY DATA:  I have reviewed the data as listed    Component Value Date/Time   NA 139 11/24/2019 1442   NA 138 08/12/2019 0000   K 3.7 11/24/2019 1442   CL 105 11/24/2019 1442   CO2 26 11/24/2019 1442   GLUCOSE 99 11/24/2019 1442   BUN 18 11/24/2019 1442   BUN 8 08/12/2019 0000   CREATININE 1.04 (H) 11/24/2019 1442   CALCIUM 9.1 11/24/2019 1442   PROT 6.2 (L) 11/24/2019 1442   ALBUMIN 4.2 11/24/2019 1442   AST 22 11/24/2019 1442   ALT 21 11/24/2019 1442   ALKPHOS 68 11/24/2019 1442   BILITOT 0.7 11/24/2019 1442   GFRNONAA 56 (L) 11/24/2019 1442   GFRAA >60 11/24/2019 1442    No results found for: SPEP, UPEP  Lab Results  Component Value Date   WBC 5.3 11/24/2019   NEUTROABS 4.0 11/24/2019   HGB 12.0 11/24/2019   HCT 33.9 (L) 11/24/2019   MCV 88.7 11/24/2019    PLT 117 (L) 11/24/2019      Chemistry      Component Value Date/Time   NA 139 11/24/2019 1442   NA 138 08/12/2019 0000   K 3.7 11/24/2019 1442   CL 105 11/24/2019 1442   CO2 26 11/24/2019 1442   BUN 18 11/24/2019 1442   BUN 8 08/12/2019 0000   CREATININE 1.04 (H) 11/24/2019 1442   GLU  88 08/12/2019 0000      Component Value Date/Time   CALCIUM 9.1 11/24/2019 1442   ALKPHOS 68 11/24/2019 1442   AST 22 11/24/2019 1442   ALT 21 11/24/2019 1442   BILITOT 0.7 11/24/2019 1442       RADIOGRAPHIC STUDIES: I have personally reviewed the radiological images as listed and agreed with the findings in the report. No results found.   ASSESSMENT & PLAN:  Secondary non-EBV mediated lymphoma of brain ([place]) #Recurrent diffuse large cell lymphoma of the brain-sp  R-MPV's [with intrathecal cytarabine]. S/p WBRT; July st,2021-MRI-posttreatment changes; no obvious evidence of recurrence.  Continue surveillance with imaging of the 4-6 months.  We will decide at next visit  #Gait instability/orthostatic hypotension-s/p physical therapy; continue midodrine; stable  #Neurocognitive issues/lack of motivation/extreme fatigue-multifactorial [malignancy; chemotherapy radiation]-stable  # Vaccinations- recommend calling [facility] re: Vaccinations; as per recommendations would schedule locally.  # DISPOSITION: #Follow up in 3 months MD; labs- cbc/cmp/ldh; port flush;- Dr.B   Orders Placed This Encounter  Procedures  . CBC with Differential    Standing Status:   Future    Standing Expiration Date:   11/23/2020  . Comprehensive metabolic panel    Standing Status:   Future    Standing Expiration Date:   11/23/2020  . Lactate dehydrogenase    Standing Status:   Future    Standing Expiration Date:   11/23/2020   All questions were answered. The patient knows to call the clinic with any problems, questions or concerns.      Cammie Sickle, MD 11/24/2019 9:02 PM

## 2019-12-11 ENCOUNTER — Encounter: Payer: Self-pay | Admitting: Internal Medicine

## 2019-12-16 ENCOUNTER — Other Ambulatory Visit: Payer: Self-pay | Admitting: *Deleted

## 2019-12-16 ENCOUNTER — Other Ambulatory Visit: Payer: Self-pay | Admitting: Internal Medicine

## 2019-12-16 ENCOUNTER — Encounter: Payer: Self-pay | Admitting: *Deleted

## 2019-12-16 ENCOUNTER — Inpatient Hospital Stay: Payer: Medicare Other

## 2019-12-16 DIAGNOSIS — C8583 Other specified types of non-Hodgkin lymphoma, intra-abdominal lymph nodes: Secondary | ICD-10-CM

## 2019-12-16 DIAGNOSIS — Z9189 Other specified personal risk factors, not elsewhere classified: Secondary | ICD-10-CM

## 2019-12-16 DIAGNOSIS — Z23 Encounter for immunization: Secondary | ICD-10-CM

## 2019-12-16 DIAGNOSIS — D899 Disorder involving the immune mechanism, unspecified: Secondary | ICD-10-CM

## 2019-12-16 DIAGNOSIS — D849 Immunodeficiency, unspecified: Secondary | ICD-10-CM

## 2019-12-16 MED ORDER — PNEUMOCOCCAL VAC POLYVALENT 25 MCG/0.5ML IJ INJ: 0.5000 mL | INJECTION | Freq: Once | INTRAMUSCULAR | Status: DC

## 2019-12-16 MED ORDER — MEASLES, MUMPS & RUBELLA VAC IJ SOLR: 0.5000 mL | Freq: Once | INTRAMUSCULAR | Status: DC

## 2019-12-16 NOTE — Addendum Note (Signed)
Addended by: Adelina Mings on: 12/16/2019 01:18 PM   Modules accepted: Orders

## 2019-12-16 NOTE — Progress Notes (Signed)
Vaccines are ordered as per recommendations from [facility].  H/C-  Please inform pt/schedule.

## 2019-12-22 ENCOUNTER — Other Ambulatory Visit: Payer: Self-pay

## 2019-12-22 ENCOUNTER — Inpatient Hospital Stay: Payer: Medicare Other | Attending: Internal Medicine

## 2019-12-22 DIAGNOSIS — Z23 Encounter for immunization: Secondary | ICD-10-CM | POA: Diagnosis not present

## 2019-12-22 DIAGNOSIS — C8338 Diffuse large B-cell lymphoma, lymph nodes of multiple sites: Secondary | ICD-10-CM | POA: Insufficient documentation

## 2019-12-22 MED ORDER — MEASLES, MUMPS & RUBELLA VAC IJ SOLR
0.5000 mL | Freq: Once | INTRAMUSCULAR | Status: AC
  Administered 2019-12-22: 0.5 mL via SUBCUTANEOUS
  Filled 2019-12-22: qty 0.5

## 2019-12-22 MED ORDER — PNEUMOCOCCAL VAC POLYVALENT 25 MCG/0.5ML IJ INJ
0.5000 mL | INJECTION | Freq: Once | INTRAMUSCULAR | Status: AC
  Administered 2019-12-22: 0.5 mL via INTRAMUSCULAR
  Filled 2019-12-22 (×4): qty 0.5

## 2020-02-23 ENCOUNTER — Inpatient Hospital Stay (HOSPITAL_BASED_OUTPATIENT_CLINIC_OR_DEPARTMENT_OTHER): Payer: Medicare Other | Admitting: Internal Medicine

## 2020-02-23 ENCOUNTER — Inpatient Hospital Stay: Payer: Medicare Other | Attending: Internal Medicine

## 2020-02-23 ENCOUNTER — Other Ambulatory Visit: Payer: Self-pay

## 2020-02-23 VITALS — BP 127/74 | HR 61 | Temp 97.6°F | Resp 20 | Ht 62.0 in | Wt 178.4 lb

## 2020-02-23 DIAGNOSIS — Z79899 Other long term (current) drug therapy: Secondary | ICD-10-CM | POA: Insufficient documentation

## 2020-02-23 DIAGNOSIS — I951 Orthostatic hypotension: Secondary | ICD-10-CM | POA: Insufficient documentation

## 2020-02-23 DIAGNOSIS — Z8249 Family history of ischemic heart disease and other diseases of the circulatory system: Secondary | ICD-10-CM | POA: Insufficient documentation

## 2020-02-23 DIAGNOSIS — C8599 Non-Hodgkin lymphoma, unspecified, extranodal and solid organ sites: Secondary | ICD-10-CM

## 2020-02-23 DIAGNOSIS — R2689 Other abnormalities of gait and mobility: Secondary | ICD-10-CM | POA: Insufficient documentation

## 2020-02-23 DIAGNOSIS — Z9221 Personal history of antineoplastic chemotherapy: Secondary | ICD-10-CM | POA: Diagnosis not present

## 2020-02-23 DIAGNOSIS — Z9484 Stem cells transplant status: Secondary | ICD-10-CM | POA: Diagnosis not present

## 2020-02-23 DIAGNOSIS — Z86718 Personal history of other venous thrombosis and embolism: Secondary | ICD-10-CM | POA: Diagnosis not present

## 2020-02-23 DIAGNOSIS — Z833 Family history of diabetes mellitus: Secondary | ICD-10-CM | POA: Insufficient documentation

## 2020-02-23 DIAGNOSIS — Z8572 Personal history of non-Hodgkin lymphomas: Secondary | ICD-10-CM | POA: Diagnosis present

## 2020-02-23 DIAGNOSIS — Z800 Family history of malignant neoplasm of digestive organs: Secondary | ICD-10-CM | POA: Diagnosis not present

## 2020-02-23 LAB — CBC WITH DIFFERENTIAL/PLATELET
Abs Immature Granulocytes: 0.02 10*3/uL (ref 0.00–0.07)
Basophils Absolute: 0 10*3/uL (ref 0.0–0.1)
Basophils Relative: 1 %
Eosinophils Absolute: 0.1 10*3/uL (ref 0.0–0.5)
Eosinophils Relative: 1 %
HCT: 32.8 % — ABNORMAL LOW (ref 36.0–46.0)
Hemoglobin: 11.6 g/dL — ABNORMAL LOW (ref 12.0–15.0)
Immature Granulocytes: 0 %
Lymphocytes Relative: 13 %
Lymphs Abs: 0.7 10*3/uL (ref 0.7–4.0)
MCH: 32.1 pg (ref 26.0–34.0)
MCHC: 35.4 g/dL (ref 30.0–36.0)
MCV: 90.9 fL (ref 80.0–100.0)
Monocytes Absolute: 0.6 10*3/uL (ref 0.1–1.0)
Monocytes Relative: 11 %
Neutro Abs: 4 10*3/uL (ref 1.7–7.7)
Neutrophils Relative %: 74 %
Platelets: 125 10*3/uL — ABNORMAL LOW (ref 150–400)
RBC: 3.61 MIL/uL — ABNORMAL LOW (ref 3.87–5.11)
RDW: 13.5 % (ref 11.5–15.5)
WBC: 5.4 10*3/uL (ref 4.0–10.5)
nRBC: 0 % (ref 0.0–0.2)

## 2020-02-23 LAB — COMPREHENSIVE METABOLIC PANEL
ALT: 13 U/L (ref 0–44)
AST: 15 U/L (ref 15–41)
Albumin: 4.5 g/dL (ref 3.5–5.0)
Alkaline Phosphatase: 68 U/L (ref 38–126)
Anion gap: 8 (ref 5–15)
BUN: 12 mg/dL (ref 8–23)
CO2: 27 mmol/L (ref 22–32)
Calcium: 8.9 mg/dL (ref 8.9–10.3)
Chloride: 106 mmol/L (ref 98–111)
Creatinine, Ser: 1.02 mg/dL — ABNORMAL HIGH (ref 0.44–1.00)
GFR, Estimated: 58 mL/min — ABNORMAL LOW (ref >60)
Glucose, Bld: 110 mg/dL — ABNORMAL HIGH (ref 70–99)
Potassium: 3.6 mmol/L (ref 3.5–5.1)
Sodium: 141 mmol/L (ref 135–145)
Total Bilirubin: 0.7 mg/dL (ref 0.3–1.2)
Total Protein: 6.4 g/dL — ABNORMAL LOW (ref 6.5–8.1)

## 2020-02-23 LAB — LACTATE DEHYDROGENASE: LDH: 125 U/L (ref 98–192)

## 2020-02-23 MED ORDER — SODIUM CHLORIDE 0.9% FLUSH
10.0000 mL | Freq: Once | INTRAVENOUS | Status: AC
  Administered 2020-02-23: 10 mL via INTRAVENOUS
  Filled 2020-02-23: qty 10

## 2020-02-23 MED ORDER — HEPARIN SOD (PORK) LOCK FLUSH 100 UNIT/ML IV SOLN
500.0000 [IU] | Freq: Once | INTRAVENOUS | Status: AC
  Administered 2020-02-23: 500 [IU] via INTRAVENOUS
  Filled 2020-02-23: qty 5

## 2020-02-23 MED ORDER — HEPARIN SOD (PORK) LOCK FLUSH 100 UNIT/ML IV SOLN
INTRAVENOUS | Status: AC
  Filled 2020-02-23: qty 5

## 2020-02-23 NOTE — Assessment & Plan Note (Addendum)
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 S/p WBRT; June 25th 2021-MRI-posttreatment changes; no obvious evidence of recurrence.  Continue surveillance with imaging of the 4-6 months.will order MRI brain today/ in 2 months.   #Gait instability/orthostatic hypotension-s/p physical therapy; continue midodrine- STABLE.   #Neurocognitive issues/lack of motivation/extreme fatigue-multifactorial [malignancy; chemotherapy radiation]- STABLE.   # ? HSV-discussed that patient is immunocompromise given multiple rounds of chemotherapy/prior stem cell transplant.  And has risk of HSV.  # Chest pressure- only on walking- recommend evaluation with PCP/Cardiology.   # DISPOSITION: #Follow up in 2 months MD; labs- cbc/cmp/ldh; port flush;MRI prior-- Dr.B

## 2020-02-23 NOTE — Progress Notes (Signed)
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 CT- 1-2 CM LN subpectoral/medistinal/ retro-peritoneal/pelvic/ Right inguinal LN 1.6cm- Bx- DLBCL- ABC; myc-POS; FISH gene re-arragement-NEG. PET- MULTIPLE LN/ Bone involvement; R-CHOP x6- CR'# OCT 2017- PET scan- CR; DEC 22nd 2017- BMBx- "atypical large B cells- <1%  [[facility]; II opinion- Dr.Grover- review of path- not concerning for residual lymphoma]; recommend surviellance  # March 26th  2018PET- NED  # Lumbar puncture- difficult-spinal headache. S/p high dose MXT x4.  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   # OCT 2020- MASS  spanning the corpus callosum. Brain Biopsy  at [facility]-DLBCL. CD20+. EBV -, Ki67+, Pax5+. Port placed 10/27, R-MPV started 10/27 [facility]; Dr.Grover]. s/p WBRT ; MARCH 31st- MRI- remission/Stable disease   ---------------------------------------------------------- # June 2017-Elevated LFTs- valacylovir/diflucan; resolved.   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   # May 2017- EF- 55-65%  #August 2019-double vision; secondary to refractive error-prism improved. 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  Patient continues to have fatigue.  She continues to have memory/neurocognitive impairment.  She continues to have gait instability; however no falls.  Denies any worsening headaches.  No nausea no vomiting.  Patient notes episodes of chest pressure especially on walking some time.  Chest pressure resolves at rest.  Patient denies any chest pain at rest.  Review of Systems  Constitutional: Positive for malaise/fatigue. Negative for chills, diaphoresis, fever and weight loss.  HENT: Negative for nosebleeds and sore throat.   Eyes: Negative for double vision.  Respiratory: Negative for cough, hemoptysis, sputum production and wheezing.   Cardiovascular: Negative for chest pain, orthopnea and leg swelling.  Gastrointestinal: Negative for abdominal pain, blood in stool, constipation, diarrhea, heartburn, melena, nausea and vomiting.  Genitourinary: Negative for dysuria, frequency and urgency.  Musculoskeletal: Negative for back pain and joint pain.  Skin: Negative.  Negative for itching and rash.  Neurological: Positive for dizziness. Negative for tingling, focal weakness and weakness.   Endo/Heme/Allergies: Does not bruise/bleed easily.  Psychiatric/Behavioral: Positive for memory loss. Negative for depression. The patient does not have insomnia.       PAST MEDICAL HISTORY :  Past Medical History:  Diagnosis Date  . Arthritis   . Blood dyscrasia   . Cancer ([place])   . Chest pain, unspecified   . Diverticulosis 07/06/15  . Dysrhythmia    tachycardia  . Esophagitis 07/06/15  . Fatigue   . Gastritis 07/06/15  . GERD (gastroesophageal reflux disease)   . 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 Leg cramps   . Lymphoma ([place]) 09/14/15   Monoclonal B cell lymphoma  . Night sweats   . Osteoporosis   . Overweight(278.02)    Obesity  . Palpitations   . Shortness of breath dyspnea   . Tachycardia   . Thrombocytopenia ([place])     PAST SURGICAL HISTORY :   Past Surgical History:  Procedure Laterality Date  . ABDOMINAL HYSTERECTOMY  2005  . BONE MARROW BIOPSY  09/14/15  . CHOLECYSTECTOMY  1997  . COLONOSCOPY  07/05/2014  . ESOPHAGOGASTRODUODENOSCOPY  07/05/2014  . INGUINAL LYMPH NODE BIOPSY Right 10/05/2015   Procedure: INGUINAL LYMPH NODE BIOPSY;  Surgeon: Christene Lye, MD;  Location: [facility] ORS;  Service: General;  Laterality: Right;  . PERIPHERAL VASCULAR CATHETERIZATION N/A 09/27/2015   Procedure: Glori Luis Cath Insertion;  Surgeon: Algernon Huxley, MD;  Location: [place] CV LAB;  Service: Cardiovascular;  Laterality: N/A;  . PORTA CATH REMOVAL Right 06/03/2017   Procedure: PORTA CATH REMOVAL, with venous thrombectomy;  Surgeon: Algernon Huxley, MD;  Location: [place] CV LAB;  Service: Cardiovascular;  Laterality: Right;  . PORTACATH PLACEMENT Right     FAMILY HISTORY :   Family History  Problem Relation Age of Onset  . Heart disease Father        CABG x 4  . Multiple myeloma Father   . Hypertension Mother   . Pancreatic cancer Mother   . Ulcers Mother   . Asthma Mother   . Brain cancer Maternal Uncle   .  Multiple myeloma Maternal Uncle   . Diabetes Neg Hx   . Breast cancer Neg Hx     SOCIAL HISTORY:   Social History   Tobacco Use  . Smoking status: Never Smoker  . Smokeless tobacco: Never Used  Vaping Use  . Vaping Use: Never used  Substance Use Topics  . Alcohol use: No    Alcohol/week: 0.0 standard drinks  . Drug use: No    ALLERGIES:  is allergic to ondansetron hcl, oxycodone, and rituximab.  MEDICATIONS:  Current Outpatient Medications  Medication Sig Dispense Refill  . acetaminophen (TYLENOL 8 HOUR) 650 MG CR tablet Take 650 mg by mouth every 8 (eight) hours as needed for pain.    Marland Kitchen escitalopram (LEXAPRO) 10 MG tablet TAKE ONE TABLET EVERY DAY 90 tablet 2  . memantine (NAMENDA) 10 MG tablet Take 1 tablet (10 mg total) by mouth 2 (two) times daily. 60 tablet 3  . midodrine (PROAMATINE) 5 MG tablet Take 1 tablet by mouth 3 (three) times daily.    . ondansetron (ZOFRAN) 4 MG tablet Take 4 mg by mouth every 8 (eight) hours as needed for nausea or vomiting.    . ondansetron (ZOFRAN-ODT) 4 MG disintegrating tablet Take 1 tablet by mouth every 12 (twelve) hours.    . sodium chloride (ALTAMIST SPRAY) 0.65 % nasal spray Place 2 sprays into the nose every 6 (six) hours as needed.     No current facility-administered medications for this visit.   Facility-Administered Medications Ordered in Other Visits  Medication Dose Route Frequency Provider Last Rate  Last Admin  . 0.9 %  sodium chloride infusion   Intravenous Continuous Evlyn Kanner, NP   Stopped at 10/14/15 1312  . methotrexate (50 mg/ml) 6.3 g in sodium chloride 0.9 % 1,000 mL injection   Intravenous Once Charlaine Dalton R, MD      . sodium chloride flush (NS) 0.9 % injection 10 mL  10 mL Intravenous PRN Cammie Sickle, MD   10 mL at 10/05/16 1400    PHYSICAL EXAMINATION: ECOG PERFORMANCE STATUS: 0 - Asymptomatic  BP 127/74 (BP Location: Left Arm, Patient Position: Sitting)   Pulse 61   Temp 97.6 F (36.4  C) (Tympanic)   Resp 20   Ht 5' 2"  (1.575 m)   Wt 178 lb 6.4 oz (80.9 kg)   BMI 32.63 kg/m   Filed Weights   02/23/20 1430  Weight: 178 lb 6.4 oz (80.9 kg)   Physical Exam Constitutional:      Comments: Ambulating independently.  Accompanied by husband.    HENT:     Head: Normocephalic and atraumatic.     Mouth/Throat:     Pharynx: No oropharyngeal exudate.  Eyes:     Pupils: Pupils are equal, round, and reactive to light.  Cardiovascular:     Rate and Rhythm: Normal rate and regular rhythm.  Pulmonary:     Effort: Pulmonary effort is normal. No respiratory distress.     Breath sounds: Normal breath sounds. No wheezing.  Abdominal:     General: Bowel sounds are normal. There is no distension.     Palpations: Abdomen is soft. There is no mass.     Tenderness: There is no abdominal tenderness. There is no guarding or rebound.  Musculoskeletal:        General: No tenderness. Normal range of motion.     Cervical back: Normal range of motion and neck supple.  Skin:    General: Skin is warm.  Neurological:     Mental Status: She is alert and oriented to person, place, and time.  Psychiatric:        Mood and Affect: Affect normal.     LABORATORY DATA:  I have reviewed the data as listed    Component Value Date/Time   NA 141 02/23/2020 1411   NA 138 08/12/2019 0000   K 3.6 02/23/2020 1411   CL 106 02/23/2020 1411   CO2 27 02/23/2020 1411   GLUCOSE 110 (H) 02/23/2020 1411   BUN 12 02/23/2020 1411   BUN 8 08/12/2019 0000   CREATININE 1.02 (H) 02/23/2020 1411   CALCIUM 8.9 02/23/2020 1411   PROT 6.4 (L) 02/23/2020 1411   ALBUMIN 4.5 02/23/2020 1411   AST 15 02/23/2020 1411   ALT 13 02/23/2020 1411   ALKPHOS 68 02/23/2020 1411   BILITOT 0.7 02/23/2020 1411   GFRNONAA 58 (L) 02/23/2020 1411   GFRAA >60 11/24/2019 1442    No results found for: SPEP, UPEP  Lab Results  Component Value Date   WBC 5.4 02/23/2020   NEUTROABS 4.0 02/23/2020   HGB 11.6 (L)  02/23/2020   HCT 32.8 (L) 02/23/2020   MCV 90.9 02/23/2020   PLT 125 (L) 02/23/2020      Chemistry      Component Value Date/Time   NA 141 02/23/2020 1411   NA 138 08/12/2019 0000   K 3.6 02/23/2020 1411   CL 106 02/23/2020 1411   CO2 27 02/23/2020 1411   BUN 12 02/23/2020 1411   BUN 8 08/12/2019 0000  CREATININE 1.02 (H) 02/23/2020 1411   GLU 88 08/12/2019 0000      Component Value Date/Time   CALCIUM 8.9 02/23/2020 1411   ALKPHOS 68 02/23/2020 1411   AST 15 02/23/2020 1411   ALT 13 02/23/2020 1411   BILITOT 0.7 02/23/2020 1411       RADIOGRAPHIC STUDIES: I have personally reviewed the radiological images as listed and agreed with the findings in the report. No results found.   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 S/p WBRT; June 25th 2021-MRI-posttreatment changes; no obvious evidence of recurrence.  Continue surveillance with imaging of the 4-6 months.will order MRI brain today/ in 2 months.   #Gait instability/orthostatic hypotension-s/p physical therapy; continue midodrine- STABLE.   #Neurocognitive issues/lack of motivation/extreme fatigue-multifactorial [malignancy; chemotherapy radiation]- STABLE.   # ? HSV-discussed that patient is immunocompromise given multiple rounds of chemotherapy/prior stem cell transplant.  And has risk of HSV.  # Chest pressure- only on walking- recommend evaluation with PCP/Cardiology.   # DISPOSITION: #Follow up in 2 months MD; labs- cbc/cmp/ldh; port flush;MRI prior-- Dr.B   Orders Placed This Encounter  Procedures  . MR Brain W Wo Contrast    Standing Status:   Future    Standing Expiration Date:   02/22/2021    Order Specific Question:   If indicated for the ordered procedure, I authorize the administration of contrast media per Radiology protocol    Answer:   Yes    Order Specific Question:    What is the patient's sedation requirement?    Answer:   No Sedation    Order Specific Question:   Does the patient have a pacemaker or implanted devices?    Answer:   No    Order Specific Question:   Use SRS Protocol?    Answer:   No    Order Specific Question:   Preferred imaging location?    Answer:   [facility] (table limit - 550lbs)  . CBC with Differential    Standing Status:   Future    Standing Expiration Date:   02/22/2021  . Comprehensive metabolic panel    Standing Status:   Future    Standing Expiration Date:   02/22/2021  . Lactate dehydrogenase    Standing Status:   Future    Standing Expiration Date:   02/22/2021   All questions were answered. The patient knows to call the clinic with any problems, questions or concerns.      Cammie Sickle, MD 02/23/2020 4:44 PM

## 2020-04-15 ENCOUNTER — Other Ambulatory Visit: Payer: Self-pay

## 2020-04-18 ENCOUNTER — Other Ambulatory Visit: Payer: Self-pay

## 2020-04-18 ENCOUNTER — Ambulatory Visit
Admission: RE | Admit: 2020-04-18 | Discharge: 2020-04-18 | Disposition: A | Discharge status: Home / Self Care | Payer: Medicare Other | Source: Ambulatory Visit | Attending: Internal Medicine | Admitting: Internal Medicine

## 2020-04-18 DIAGNOSIS — C8599 Non-Hodgkin lymphoma, unspecified, extranodal and solid organ sites: Secondary | ICD-10-CM | POA: Insufficient documentation

## 2020-04-18 MED ORDER — GADOBUTROL 1 MMOL/ML IV SOLN
8.0000 mL | Freq: Once | INTRAVENOUS | Status: AC | PRN
  Administered 2020-04-18: 8 mL via INTRAVENOUS

## 2020-04-19 ENCOUNTER — Encounter: Payer: Self-pay | Admitting: Internal Medicine

## 2020-04-19 ENCOUNTER — Inpatient Hospital Stay: Payer: Medicare Other | Attending: Internal Medicine

## 2020-04-19 ENCOUNTER — Ambulatory Visit (INDEPENDENT_AMBULATORY_CARE_PROVIDER_SITE_OTHER): Payer: Medicare Other | Admitting: Internal Medicine

## 2020-04-19 ENCOUNTER — Other Ambulatory Visit: Payer: Self-pay

## 2020-04-19 ENCOUNTER — Inpatient Hospital Stay (HOSPITAL_BASED_OUTPATIENT_CLINIC_OR_DEPARTMENT_OTHER): Payer: Medicare Other | Admitting: Internal Medicine

## 2020-04-19 VITALS — BP 119/81 | HR 78 | Temp 96.2°F | Resp 18 | Wt 170.6 lb

## 2020-04-19 VITALS — BP 106/70 | HR 67 | Temp 98.4°F | Resp 15 | Ht 62.0 in | Wt 171.0 lb

## 2020-04-19 DIAGNOSIS — C8589 Other specified types of non-Hodgkin lymphoma, extranodal and solid organ sites: Secondary | ICD-10-CM

## 2020-04-19 DIAGNOSIS — Z79899 Other long term (current) drug therapy: Secondary | ICD-10-CM | POA: Insufficient documentation

## 2020-04-19 DIAGNOSIS — I951 Orthostatic hypotension: Secondary | ICD-10-CM | POA: Diagnosis not present

## 2020-04-19 DIAGNOSIS — Z780 Asymptomatic menopausal state: Secondary | ICD-10-CM

## 2020-04-19 DIAGNOSIS — F411 Generalized anxiety disorder: Secondary | ICD-10-CM

## 2020-04-19 DIAGNOSIS — Z0000 Encounter for general adult medical examination without abnormal findings: Secondary | ICD-10-CM

## 2020-04-19 DIAGNOSIS — Z923 Personal history of irradiation: Secondary | ICD-10-CM | POA: Diagnosis not present

## 2020-04-19 DIAGNOSIS — C8599 Non-Hodgkin lymphoma, unspecified, extranodal and solid organ sites: Secondary | ICD-10-CM | POA: Diagnosis not present

## 2020-04-19 DIAGNOSIS — Z1231 Encounter for screening mammogram for malignant neoplasm of breast: Secondary | ICD-10-CM | POA: Diagnosis not present

## 2020-04-19 DIAGNOSIS — G3184 Mild cognitive impairment, so stated: Secondary | ICD-10-CM | POA: Diagnosis not present

## 2020-04-19 DIAGNOSIS — C8338 Diffuse large B-cell lymphoma, lymph nodes of multiple sites: Secondary | ICD-10-CM | POA: Diagnosis present

## 2020-04-19 LAB — COMPREHENSIVE METABOLIC PANEL
ALT: 12 U/L (ref 0–44)
AST: 19 U/L (ref 15–41)
Albumin: 4.2 g/dL (ref 3.5–5.0)
Alkaline Phosphatase: 67 U/L (ref 38–126)
Anion gap: 6 (ref 5–15)
BUN: 15 mg/dL (ref 8–23)
CO2: 29 mmol/L (ref 22–32)
Calcium: 9.3 mg/dL (ref 8.9–10.3)
Chloride: 103 mmol/L (ref 98–111)
Creatinine, Ser: 1.14 mg/dL — ABNORMAL HIGH (ref 0.44–1.00)
GFR, Estimated: 53 mL/min — ABNORMAL LOW (ref >60)
Glucose, Bld: 129 mg/dL — ABNORMAL HIGH (ref 70–99)
Potassium: 3.7 mmol/L (ref 3.5–5.1)
Sodium: 138 mmol/L (ref 135–145)
Total Bilirubin: 0.6 mg/dL (ref 0.3–1.2)
Total Protein: 6 g/dL — ABNORMAL LOW (ref 6.5–8.1)

## 2020-04-19 LAB — CBC WITH DIFFERENTIAL/PLATELET
Abs Immature Granulocytes: 0.02 10*3/uL (ref 0.00–0.07)
Basophils Absolute: 0 10*3/uL (ref 0.0–0.1)
Basophils Relative: 1 %
Eosinophils Absolute: 0.1 10*3/uL (ref 0.0–0.5)
Eosinophils Relative: 2 %
HCT: 34.2 % — ABNORMAL LOW (ref 36.0–46.0)
Hemoglobin: 12 g/dL (ref 12.0–15.0)
Immature Granulocytes: 1 %
Lymphocytes Relative: 14 %
Lymphs Abs: 0.6 10*3/uL — ABNORMAL LOW (ref 0.7–4.0)
MCH: 32.2 pg (ref 26.0–34.0)
MCHC: 35.1 g/dL (ref 30.0–36.0)
MCV: 91.7 fL (ref 80.0–100.0)
Monocytes Absolute: 0.3 10*3/uL (ref 0.1–1.0)
Monocytes Relative: 7 %
Neutro Abs: 3.3 10*3/uL (ref 1.7–7.7)
Neutrophils Relative %: 75 %
Platelets: 131 10*3/uL — ABNORMAL LOW (ref 150–400)
RBC: 3.73 MIL/uL — ABNORMAL LOW (ref 3.87–5.11)
RDW: 12.7 % (ref 11.5–15.5)
WBC: 4.4 10*3/uL (ref 4.0–10.5)
nRBC: 0 % (ref 0.0–0.2)

## 2020-04-19 LAB — LACTATE DEHYDROGENASE: LDH: 119 U/L (ref 98–192)

## 2020-04-19 MED ORDER — HEPARIN SOD (PORK) LOCK FLUSH 100 UNIT/ML IV SOLN
500.0000 [IU] | Freq: Once | INTRAVENOUS | Status: AC
  Administered 2020-04-19: 500 [IU] via INTRAVENOUS
  Filled 2020-04-19: qty 5

## 2020-04-19 MED ORDER — HEPARIN SOD (PORK) LOCK FLUSH 100 UNIT/ML IV SOLN
INTRAVENOUS | Status: AC
  Filled 2020-04-19: qty 5

## 2020-04-19 MED ORDER — ALPRAZOLAM 0.25 MG PO TABS: 0.2500 mg | ORAL_TABLET | Freq: Every evening | ORAL | 0 refills | Status: DC | PRN

## 2020-04-19 MED ORDER — SODIUM CHLORIDE 0.9% FLUSH
10.0000 mL | Freq: Once | INTRAVENOUS | Status: AC
  Administered 2020-04-19: 10 mL via INTRAVENOUS
  Filled 2020-04-19: qty 10

## 2020-04-19 NOTE — Progress Notes (Signed)
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 CT- 1-2 CM LN subpectoral/medistinal/ retro-peritoneal/pelvic/ Right inguinal LN 1.6cm- Bx- DLBCL- ABC; myc-POS; FISH gene re-arragement-NEG. PET- MULTIPLE LN/ Bone involvement; R-CHOP x6- CR'# OCT 2017- PET scan- CR; DEC 22nd 2017- BMBx- "atypical large B cells- <1%  [[facility]; II opinion- Dr.Grover- review of path- not concerning for residual lymphoma]; recommend surviellance  # March 26th  2018PET- NED  # Lumbar puncture- difficult-spinal headache. S/p high dose MXT x4.  ------------------------------------------------------------------- # NOV 2018-recurrent diffuse large B-cell lymphoma [right axilla lymph node biopsy proven]; PET- multiple bone lesions; axillary adenopathy; splenomegaly uptake  # Nov 16th 2018- R-; s/p 3 cycles-CR' Auto-Stem cell Transplant date: 07/11/17 [facility]; Dr.Vincent]; May 1st 2019- PET CR.   # OCT 2020- MASS  spanning the corpus callosum. Brain Biopsy  at [facility]-DLBCL. CD20+. EBV -, Ki67+, Pax5+. Port placed 10/27, R-MPV started 10/27 [facility]; Dr.Grover]. s/p WBRT ; MARCH 31st- MRI- remission/Stable disease   ---------------------------------------------------------- # June 2017-Elevated LFTs- valacylovir/diflucan; resolved.   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   # May 2017- EF- 55-65%  #August 2019-double vision; secondary to refractive error-prism improved. 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  Patient continues to have mild to moderate fatigue.  Continues to have memory/neurocognitive impairment.  Gait stability stable no recent falls.  Denies any worsening headaches no nausea no vomiting.  No new shortness of breath or cough.  No chest pain.  Review of Systems  Constitutional: Positive for malaise/fatigue. Negative for chills, diaphoresis, fever and weight loss.  HENT: Negative for nosebleeds and sore throat.   Eyes: Negative for double vision.  Respiratory: Negative for cough, hemoptysis, sputum production and wheezing.   Cardiovascular: Negative for chest pain, orthopnea and leg swelling.  Gastrointestinal: Negative for abdominal pain, blood in stool, constipation, diarrhea, heartburn, melena, nausea and vomiting.  Genitourinary: Negative for dysuria, frequency and urgency.  Musculoskeletal: Negative for back pain and joint pain.  Skin: Negative.  Negative for itching and rash.  Neurological: Positive for dizziness. Negative for tingling, focal weakness and weakness.  Endo/Heme/Allergies: Does not bruise/bleed easily.   Psychiatric/Behavioral: Positive for memory loss. Negative for depression. The patient does not have insomnia.       PAST MEDICAL HISTORY :  Past Medical History:  Diagnosis Date  . Arthritis   . Blood dyscrasia   .  Cancer ([place])   . Chest pain, unspecified   . Diverticulosis 07/06/15  . Dysrhythmia    tachycardia  . Esophagitis 07/06/15  . Fatigue   . Gastritis 07/06/15  . GERD (gastroesophageal reflux disease)   . 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 Leg cramps   . Lymphoma ([place]) 09/14/15   Monoclonal B cell lymphoma  . Night sweats   . Osteoporosis   . Overweight(278.02)    Obesity  . Palpitations   . Shortness of breath dyspnea   . Tachycardia   . Thrombocytopenia ([place])     PAST SURGICAL HISTORY :   Past Surgical History:  Procedure Laterality Date  . ABDOMINAL HYSTERECTOMY  2005  . BONE MARROW BIOPSY  09/14/15  . CHOLECYSTECTOMY  1997  . COLONOSCOPY  07/05/2014  . ESOPHAGOGASTRODUODENOSCOPY  07/05/2014  . INGUINAL LYMPH NODE BIOPSY Right 10/05/2015   Procedure: INGUINAL LYMPH NODE BIOPSY;  Surgeon: Christene Lye, MD;  Location: [facility] ORS;  Service: General;  Laterality: Right;  . PERIPHERAL VASCULAR CATHETERIZATION N/A 09/27/2015   Procedure: Glori Luis Cath Insertion;  Surgeon: Algernon Huxley, MD;  Location: [place] CV LAB;  Service: Cardiovascular;  Laterality: N/A;  . PORTA CATH REMOVAL Right 06/03/2017   Procedure: PORTA CATH REMOVAL, with venous thrombectomy;  Surgeon: Algernon Huxley, MD;  Location: [place] CV LAB;  Service: Cardiovascular;  Laterality: Right;  . PORTACATH PLACEMENT Right     FAMILY HISTORY :   Family History  Problem Relation Age of Onset  . Heart disease Father        CABG x 4  . Multiple myeloma Father   . Hypertension Mother   . Pancreatic cancer Mother   . Ulcers Mother   . Asthma Mother   . Brain cancer Maternal Uncle   . Multiple myeloma Maternal Uncle   . Diabetes Neg Hx   .  Breast cancer Neg Hx     SOCIAL HISTORY:   Social History   Tobacco Use  . Smoking status: Never Smoker  . Smokeless tobacco: Never Used  Vaping Use  . Vaping Use: Never used  Substance Use Topics  . Alcohol use: No    Alcohol/week: 0.0 standard drinks  . Drug use: No    ALLERGIES:  is allergic to ondansetron hcl, oxycodone, and rituximab.  MEDICATIONS:  Current Outpatient Medications  Medication Sig Dispense Refill  . acetaminophen (TYLENOL 8 HOUR) 650 MG CR tablet Take 650 mg by mouth every 8 (eight) hours as needed for pain.    . midodrine (PROAMATINE) 5 MG tablet Take 1 tablet by mouth 3 (three) times daily.    . ondansetron (ZOFRAN) 4 MG tablet Take 4 mg by mouth every 8 (eight) hours as needed for nausea or vomiting. (Patient not taking: Reported on 04/19/2020)    . ondansetron (ZOFRAN-ODT) 4 MG disintegrating tablet Take 1 tablet by mouth every 12 (twelve) hours. (Patient not taking: Reported on 04/19/2020)     No current facility-administered medications for this visit.   Facility-Administered Medications Ordered in Other Visits  Medication Dose Route Frequency Provider Last Rate Last Admin  . 0.9 %  sodium chloride infusion   Intravenous Continuous Evlyn Kanner, NP   Stopped at 10/14/15 1312  . methotrexate (50 mg/ml) 6.3 g in sodium chloride 0.9 % 1,000 mL injection   Intravenous Once Charlaine Dalton R, MD      . sodium chloride flush (NS) 0.9 % injection 10 mL  10 mL Intravenous PRN Cammie Sickle, MD   10 mL at 10/05/16 1400    PHYSICAL EXAMINATION: ECOG PERFORMANCE STATUS: 0 - Asymptomatic  BP 119/81 (BP Location: Left Arm, Patient Position: Standing)   Pulse 78   Temp (!) 96.2 F (35.7 C) (Tympanic)   Resp 18   Wt 170 lb 9.6 oz (77.4 kg)   SpO2 100%   BMI 31.20 kg/m   Filed Weights   04/19/20 1101  Weight: 170 lb 9.6 oz (77.4 kg)   Physical Exam Constitutional:      Comments: Ambulating independently.  Accompanied by husband.     HENT:     Head: Normocephalic and atraumatic.     Mouth/Throat:     Pharynx: No oropharyngeal exudate.  Eyes:     Pupils: Pupils are equal, round, and reactive to light.  Cardiovascular:     Rate and Rhythm: Normal rate and regular rhythm.  Pulmonary:     Effort: Pulmonary effort is normal. No respiratory distress.     Breath sounds: Normal breath sounds. No wheezing.  Abdominal:     General: Bowel sounds are normal. There is no distension.     Palpations: Abdomen is soft. There is no mass.     Tenderness: There is no abdominal tenderness. There is no guarding or rebound.  Musculoskeletal:        General: No tenderness. Normal range of motion.     Cervical back: Normal range of motion and neck supple.  Skin:    General: Skin is warm.  Neurological:     Mental Status: She is alert and oriented to person, place, and time.  Psychiatric:        Mood and Affect: Affect normal.     LABORATORY DATA:  I have reviewed the data as listed    Component Value Date/Time   NA 138 04/19/2020 1042   NA 138 08/12/2019 0000   K 3.7 04/19/2020 1042   CL 103 04/19/2020 1042   CO2 29 04/19/2020 1042   GLUCOSE 129 (H) 04/19/2020 1042   BUN 15 04/19/2020 1042   BUN 8 08/12/2019 0000   CREATININE 1.14 (H) 04/19/2020 1042   CALCIUM 9.3 04/19/2020 1042   PROT 6.0 (L) 04/19/2020 1042   ALBUMIN 4.2 04/19/2020 1042   AST 19 04/19/2020 1042   ALT 12 04/19/2020 1042   ALKPHOS 67 04/19/2020 1042   BILITOT 0.6 04/19/2020 1042   GFRNONAA 53 (L) 04/19/2020 1042   GFRAA >60 11/24/2019 1442    No results found for: SPEP, UPEP  Lab Results  Component Value Date   WBC 4.4 04/19/2020   NEUTROABS 3.3 04/19/2020   HGB 12.0 04/19/2020   HCT 34.2 (L) 04/19/2020   MCV 91.7 04/19/2020   PLT 131 (L) 04/19/2020      Chemistry      Component Value Date/Time   NA 138 04/19/2020 1042   NA 138 08/12/2019 0000   K 3.7 04/19/2020 1042   CL 103 04/19/2020 1042   CO2 29 04/19/2020 1042   BUN 15  04/19/2020 1042   BUN 8 08/12/2019 0000   CREATININE 1.14 (H) 04/19/2020 1042   GLU 88 08/12/2019 0000      Component Value Date/Time   CALCIUM 9.3 04/19/2020 1042   ALKPHOS 67 04/19/2020 1042   AST 19 04/19/2020 1042   ALT 12 04/19/2020 1042   BILITOT 0.6 04/19/2020 1042       RADIOGRAPHIC STUDIES: I have personally reviewed the radiological images  as listed and agreed with the findings in the report. MR Brain W Wo Contrast  Result Date: 04/18/2020 CLINICAL DATA:  CNS lymphoma post radiation and chemotherapy. EXAM: MRI HEAD WITHOUT AND WITH CONTRAST TECHNIQUE: Multiplanar, multiecho pulse sequences of the brain and surrounding structures were obtained without and with intravenous contrast. CONTRAST:  27m GADAVIST GADOBUTROL 1 MMOL/ML IV SOLN COMPARISON:  MRI head 11/06/2019, 02/17/2019 FINDINGS: Brain: Extensive white matter hyperintensities seen throughout both cerebral hemispheres, stable and felt to be treatment related. Ventricle size mildly prominent but unchanged.  No acute infarct. Chronic hemorrhage left frontal lobe unchanged likely due to biopsy. No mass lesion identified. No abnormal enhancement. Previously noted dural thickening and enhancement has resolved. Vascular: Normal arterial flow voids Skull and upper cervical spine: No focal skeletal lesion. Normal bone marrow. Sinuses/Orbits: Mild mucosal edema paranasal sinuses. Negative orbit Other: None IMPRESSION: No change from the prior MRI Extensive white matter hyperintensities compatible with treatment effect. No mass or abnormal enhancement. Electronically Signed   By: CFranchot GalloM.D.   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 S/p WBRT; DEC 6th, 2021- MRI Brain STABLE; no evidence of any recurrent disease.  #Gait instability/orthostatic hypotension-s/p physical therapy;  continue midodrine [ MRI December 2021 white matter changes-suggestive of radiation therapy] overall stable.  # Neurocognitive issues/lack of motivation/extreme fatigue-multifactorial [malignancy; chemotherapy radiation]-stable  # DISPOSITION: # in 2 months- cbc/cmp/ldh/port flush #Follow up in 4 months MD; labs- cbc/cmp/ldh; port flush;MRI brain prior-- Dr.B  # I reviewed the blood work- with the patient in detail; also reviewed the imaging independently [as summarized above]; and with the patient in detail.     Orders Placed This Encounter  Procedures  . MR Brain W Wo Contrast    Standing Status:   Future    Standing Expiration Date:   04/19/2021    Order Specific Question:   If indicated for the ordered procedure, I authorize the administration of contrast media per Radiology protocol    Answer:   Yes    Order Specific Question:   What is the patient's sedation requirement?    Answer:   No Sedation    Order Specific Question:   Does the patient have a pacemaker or implanted devices?    Answer:   No    Order Specific Question:   Use SRS Protocol?    Answer:   No    Order Specific Question:   [facility]   All questions were answered. The patient knows to call the clinic with any problems, questions or concerns.      GCammie Sickle MD 04/19/2020 12:36 PM

## 2020-04-19 NOTE — Assessment & Plan Note (Addendum)
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 S/p WBRT; DEC 6th, 2021- MRI Brain STABLE; no evidence of any recurrent disease.  #Gait instability/orthostatic hypotension-s/p physical therapy; continue midodrine [ MRI December 2021 white matter changes-suggestive of radiation therapy] overall stable.  # Neurocognitive issues/lack of motivation/extreme fatigue-multifactorial [malignancy; chemotherapy radiation]-stable  # DISPOSITION: # in 2 months- cbc/cmp/ldh/port flush #Follow up in 4 months MD; labs- cbc/cmp/ldh; port flush;MRI brain prior-- Dr.B  # I reviewed the blood work- with the patient in detail; also reviewed the imaging independently [as summarized above]; and with the patient in detail.

## 2020-04-19 NOTE — Progress Notes (Addendum)
Patient ID: Stacy Murillo, female    DOB: April 05, 1956  Age: 65 y.o. MRN: [id number]  The patient is here for  Follow up and management of other chronic and acute problems. She is accompanied by her husband due to her cognitive changes.  This visit occurred during the SARS-CoV-2 public health emergency.  Safety protocols were in place, including screening questions prior to the visit, additional usage of staff PPE, and extensive cleaning of exam room while observing appropriate contact time as indicated for disinfecting solutions.      The risk factors are reflected in the social Stacy.  The roster of all physicians providing medical care to patient - is listed in the Snapshot section of the chart.  Activities of daily living:  The patient is 100% independent in all ADLs: dressing, toileting, feeding as well as independent mobility  Home safety : The patient has smoke detectors in the home. They wear seatbelts.  There are no firearms at home. There is no violence in the home.   There is no risks for hepatitis, STDs or HIV. There is no   Stacy of blood transfusion. They have no travel Stacy to infectious disease endemic areas of the world.  The patient has seen their dentist in the last six month. They have seen their eye doctor in the last year. They admit to slight hearing difficulty with regard to whispered voices and some television programs.  She has  deferred audiologic testing in the last year.  They do not  have excessive sun exposure. Discussed the need for sun protection: hats, long sleeves and use of sunscreen if there is significant sun exposure.   Diet: the importance of a healthy diet is discussed. They do have a healthy diet.  The benefits of regular aerobic exercise were discussed. She walks 4 times per week ,  20 minutes.   Depression screen: there are no signs or vegative symptoms of depression- irritability, change in appetite, anhedonia, sadness/tearfullness. However she  has been more anxious due to cognitive changes that have occurred in the setting of lymphoma with CNS involvement   Cognitive assessment: the patient is no longer managing her financial affairs due to short term memory loss,  But she  is actively engaged. She could relate day but not date,year and events; recalled 1/3 objects at 3 minutes.   The following portions of the patient's Stacy were reviewed and updated as appropriate: allergies, current medications, past family Stacy, past medical Stacy,  past surgical Stacy, past social Stacy  and problem list.  Visual acuity was not assessed per patient preference since she has regular follow up with her ophthalmologist. Hearing and body mass index were assessed and reviewed.   During the course of the visit the patient was educated and counseled about appropriate screening and preventive services including : fall prevention , diabetes screening, nutrition counseling, colorectal cancer screening, and recommended immunizations.    CC: The primary encounter diagnosis was Routine general medical examination at a health care facility. Diagnoses of Postmenopausal estrogen deficiency, Encounter for screening mammogram for malignant neoplasm of breast, Primary CNS lymphoma ([place]), Mild cognitive impairment with memory loss, and Generalized anxiety disorder were also pertinent to this visit.  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 currently s/p R-methotrexate procarbazine/steroid; also status post consolidation radiation  Goals of care are Palliative per last Onc note.  Sept 2021.   She has had significant  Cognitive changes due to brain irradiation   [place] sleeping due to anxiety..  3 grown children still needing financial help,  Cites this situation as the main source of anxiety . lexapro did not help.    Some balance issues due to orthostatic hypotension .  Now taking midodrine  for drops of 30 pts with upright positioning/standing.  Has been taking  7.5 mg  tid since February.  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   She has a past surgical Stacy that includes Abdominal hysterectomy (2005); Bone marrow biopsy (09/14/15); Colonoscopy (07/05/2014); Esophagogastroduodenoscopy (07/05/2014); Portacath placement (Right); Cardiac catheterization (N/A, 09/27/2015); Cholecystectomy (1997); Inguinal lymph node biopsy (Right, 10/05/2015); and PORTA CATH REMOVAL (Right, 06/03/2017).   Her family Stacy includes Asthma in her mother; Brain cancer in her maternal uncle; Heart disease in her father; Hypertension in her mother; Multiple myeloma in her father and maternal uncle; Pancreatic cancer in her mother; Ulcers in her mother.She reports that she has never smoked. She has never used smokeless tobacco. She reports that she does not drink alcohol and does not use drugs.  Outpatient Medications Prior to Visit  Medication Sig Dispense Refill  . acetaminophen (TYLENOL 8 HOUR) 650 MG CR tablet Take 650 mg by mouth every 8 (eight) hours as needed for pain.    . midodrine (PROAMATINE) 5 MG tablet Take 1.5 tablets by mouth 3 (three) times daily.     . ondansetron (ZOFRAN-ODT) 4 MG disintegrating tablet Take 1 tablet by mouth every 12 (twelve) hours.     . ondansetron (ZOFRAN) 4 MG tablet Take 4 mg by mouth every 8 (eight) hours as needed for nausea or vomiting. (Patient not taking: Reported on 04/19/2020)     Facility-Administered Medications Prior to  Visit  Medication Dose Route Frequency Provider Last Rate Last Admin  . 0.9 %  sodium chloride infusion   Intravenous Continuous Evlyn Kanner, NP   Stopped at 10/14/15 1312  . methotrexate (50 mg/ml) 6.3 g in sodium chloride 0.9 % 1,000 mL injection   Intravenous Once Charlaine Dalton R, MD      . sodium chloride flush (NS) 0.9 % injection 10 mL  10 mL Intravenous PRN Cammie Sickle, MD   10 mL at 10/05/16 1400    Review of Systems   Patient denies headache, fevers, malaise, unintentional weight loss, skin rash, eye pain, sinus congestion and sinus pain, sore throat, dysphagia,  hemoptysis , cough, dyspnea, wheezing, chest pain, palpitations, orthopnea, edema, abdominal pain, nausea, melena, diarrhea, constipation, flank pain, dysuria, hematuria, urinary  Frequency, nocturia, numbness, tingling, seizures,  Focal weakness, Loss of consciousness,  Tremor, insomnia, depression, anxiety, and suicidal ideation.      Objective:  BP 106/70 (BP Location: Left Arm, Patient Position: Sitting, Cuff Size: Large)   Pulse 67   Temp 98.4 F (36.9 C) (Oral)   Resp 15   Ht 5' 2"  (1.575 m)   Wt 171 lb (77.6 kg)   SpO2 99%   BMI 31.28 kg/m   Physical Exam  General appearance: alert, cooperative and appears stated age Head: Normocephalic, without obvious abnormality, atraumatic Eyes: conjunctivae/corneas clear. PERRL, EOM's intact. Fundi benign. Ears: normal TM's and external ear canals both  ears Nose: Nares normal. Septum midline. Mucosa normal. No drainage or sinus tenderness. Throat: lips, mucosa, and tongue normal; teeth and gums normal Neck: no adenopathy, no carotid bruit, no JVD, supple, symmetrical, trachea midline and thyroid not enlarged, symmetric, no tenderness/mass/nodules Lungs: clear to auscultation bilaterally Breasts: normal appearance, no masses or tenderness Heart: regular rate and rhythm, S1, S2 normal, no murmur, click, rub or gallop Abdomen: soft, non-tender;  bowel sounds normal; no masses,  no organomegaly Extremities: extremities normal, atraumatic, no cyanosis or edema Pulses: 2+ and symmetric Skin: Skin color, texture, turgor normal. No rashes or lesions Neurologic: Alert and oriented X 3, normal strength and tone. Normal symmetric reflexes. Normal coordination and gait.    Assessment & Plan:   Problem List Items Addressed This Visit      Unprioritized   Screening for breast cancer    It is not clear whether it is in her best interest to proceed with mammograms especially since her port a cath appears to have dropped slightly and is in the field that would likely be crushed by the mammography       Generalized anxiety disorder    Adding alprazolam for insomnia  Continue lexapro.  The risks and benefits of benzodiazepine use were discussed with patient today including excessive sedation leading to respiratory depression,  impaired thinking/driving, and addiction.  Patient was advised to avoid concurrent use with alcohol, to use medication only as needed and not to share with others  .        Relevant Medications   ALPRAZolam (XANAX) 0.25 MG tablet   Primary CNS lymphoma ([facility])    S.p intrathecal chemo and brain irradiation with diffuse white matter changes resulting in  cognitive decline.  Treatment is considered palliative       Relevant Medications   ALPRAZolam (XANAX) 0.25 MG tablet   Mild cognitive impairment with memory loss    Secondary to brain irradiation for treatment of recurrent large B cell lymphoma with CNS involvement.       RESOLVED: Routine general medical examination at a health care facility - Primary    age appropriate education and counseling updated, referrals for preventative services and immunizations addressed, dietary and smoking counseling addressed, most recent labs reviewed.  I have personally reviewed and have noted:  1) the patient's medical and social Stacy 2) The pt's use of alcohol, tobacco, and  illicit drugs 3) The patient's current medications and supplements 4) Functional ability including ADL's, fall risk, home safety risk, hearing and visual impairment 5) Diet and physical activities 6) Evidence for depression or mood disorder 7) The patient's height, weight, and BMI have been recorded in the chart  I have made referrals, and provided counseling and education based on review of the above       Other Visit Diagnoses    Postmenopausal estrogen deficiency       Relevant Orders   DG Bone Density      I am having Stacy Murillo start on ALPRAZolam. I am also having her maintain her acetaminophen, midodrine, and ondansetron.  Meds ordered this encounter  Medications  . ALPRAZolam (XANAX) 0.25 MG tablet    Sig: Take 1 tablet (0.25 mg total) by mouth at bedtime as needed for anxiety.    Dispense:  20 tablet    Refill:  0    Medications Discontinued During This Encounter  Medication Reason  . ondansetron (ZOFRAN) 4 MG tablet      A total of 40 minutes  was spent with patient and husband,  more than half of which was spent in counseling patient on the above mentioned issues , reviewing and explaining recent labs, specialist's notes,  imaging studies done, and coordination of care.  Follow-up: Return in about 6 months (around 10/18/2020).   Crecencio Mc, MD

## 2020-04-19 NOTE — Patient Instructions (Signed)
I have prescribed alprazolam to help you sleep when you are anxious.  If you find yourself needing to use it daily,  We should consider a safer daily alternative that is not habit forming.  (like trazodone,  Or melatonin   I have ordered a bone density test  We will check with [facility] about the safety of a mammogram with your porta cath    Health Maintenance After Age 65 After age 65, you are at a higher risk for certain long-term diseases and infections as well as injuries from falls. Falls are a major cause of broken bones and head injuries in people who are older than age 88. Getting regular preventive care can help to keep you healthy and well. Preventive care includes getting regular testing and making lifestyle changes as recommended by your health care provider. Talk with your health care provider about:  Which screenings and tests you should have. A screening is a test that checks for a disease when you have no symptoms.  A diet and exercise plan that is right for you. What should I know about screenings and tests to prevent falls? Screening and testing are the best ways to find a health problem early. Early diagnosis and treatment give you the best chance of managing medical conditions that are common after age 65. Certain conditions and lifestyle choices may make you more likely to have a fall. Your health care provider may recommend:  Regular vision checks. Poor vision and conditions such as cataracts can make you more likely to have a fall. If you wear glasses, make sure to get your prescription updated if your vision changes.  Medicine review. Work with your health care provider to regularly review all of the medicines you are taking, including over-the-counter medicines. Ask your health care provider about any side effects that may make you more likely to have a fall. Tell your health care provider if any medicines that you take make you feel dizzy or sleepy.  Osteoporosis  screening. Osteoporosis is a condition that causes the bones to get weaker. This can make the bones weak and cause them to break more easily.  Blood pressure screening. Blood pressure changes and medicines to control blood pressure can make you feel dizzy.  Strength and balance checks. Your health care provider may recommend certain tests to check your strength and balance while standing, walking, or changing positions.  Foot health exam. Foot pain and numbness, as well as not wearing proper footwear, can make you more likely to have a fall.  Depression screening. You may be more likely to have a fall if you have a fear of falling, feel emotionally low, or feel unable to do activities that you used to do.  Alcohol use screening. Using too much alcohol can affect your balance and may make you more likely to have a fall. What actions can I take to lower my risk of falls? General instructions  Talk with your health care provider about your risks for falling. Tell your health care provider if: ? You fall. Be sure to tell your health care provider about all falls, even ones that seem minor. ? You feel dizzy, sleepy, or off-balance.  Take over-the-counter and prescription medicines only as told by your health care provider. These include any supplements.  Eat a healthy diet and maintain a healthy weight. A healthy diet includes low-fat dairy products, low-fat (lean) meats, and fiber from whole grains, beans, and lots of fruits and vegetables. Home safety  Remove any tripping hazards, such as rugs, cords, and clutter.  Install safety equipment such as grab bars in bathrooms and safety rails on stairs.  Keep rooms and walkways well-lit. Activity   Follow a regular exercise program to stay fit. This will help you maintain your balance. Ask your health care provider what types of exercise are appropriate for you.  If you need a cane or walker, use it as recommended by your health care  provider.  Wear supportive shoes that have nonskid soles. Lifestyle  Do not drink alcohol if your health care provider tells you not to drink.  If you drink alcohol, limit how much you have: ? 0-1 drink a day for women. ? 0-2 drinks a day for men.  Be aware of how much alcohol is in your drink. In the [place] one drink equals one typical bottle of beer (12 oz), one-half glass of wine (5 oz), or one shot of hard liquor (1 oz).  Do not use any products that contain nicotine or tobacco, such as cigarettes and e-cigarettes. If you need help quitting, ask your health care provider. Summary  Having a healthy lifestyle and getting preventive care can help to protect your health and wellness after age 65.  Screening and testing are the best way to find a health problem early and help you avoid having a fall. Early diagnosis and treatment give you the best chance for managing medical conditions that are more common for people who are older than age 65.  Falls are a major cause of broken bones and head injuries in people who are older than age 65. Take precautions to prevent a fall at home.  Work with your health care provider to learn what changes you can make to improve your health and wellness and to prevent falls. This information is not intended to replace advice given to you by your health care provider. Make sure you discuss any questions you have with your health care provider. Document Revised: 08/21/2018 Document Reviewed: 03/13/2017 Elsevier Patient Education  2020 [employer].

## 2020-04-21 ENCOUNTER — Encounter: Payer: Self-pay | Admitting: Internal Medicine

## 2020-04-21 DIAGNOSIS — G3184 Mild cognitive impairment, so stated: Secondary | ICD-10-CM | POA: Insufficient documentation

## 2020-04-21 DIAGNOSIS — F0280 Dementia in other diseases classified elsewhere without behavioral disturbance: Secondary | ICD-10-CM | POA: Insufficient documentation

## 2020-04-21 NOTE — Assessment & Plan Note (Signed)
S.p intrathecal chemo and brain irradiation with diffuse white matter changes resulting in  cognitive decline.  Treatment is considered palliative

## 2020-04-21 NOTE — Assessment & Plan Note (Signed)
Secondary to brain irradiation for treatment of recurrent large B cell lymphoma with CNS involvement.

## 2020-04-21 NOTE — Assessment & Plan Note (Signed)
It is not clear whether it is in her best interest to proceed with mammograms especially since her port a cath appears to have dropped slightly and is in the field that would likely be crushed by the mammography

## 2020-04-21 NOTE — Assessment & Plan Note (Signed)

## 2020-04-25 ENCOUNTER — Telehealth: Payer: Self-pay | Admitting: Internal Medicine

## 2020-04-25 NOTE — Telephone Encounter (Signed)
On 12/10-left a voicemail for the patient's husband to discuss patient's need for mammograms the context of her lymphoma of the pain.  I think is reasonable to hold off mammograms while patient is still at high risk for recurrent malignancy in the brain.  We will also discuss at subsequent visits.  FYI-Dr.Tullo.

## 2020-04-27 NOTE — Assessment & Plan Note (Signed)
Adding alprazolam for insomnia  Continue lexapro.  The risks and benefits of benzodiazepine use were discussed with patient today including excessive sedation leading to respiratory depression,  impaired thinking/driving, and addiction.  Patient was advised to avoid concurrent use with alcohol, to use medication only as needed and not to share with others  .

## 2020-04-27 NOTE — Addendum Note (Signed)
Addended by: Crecencio Mc on: 04/27/2020 01:18 PM   Modules accepted: Level of Service

## 2020-05-26 ENCOUNTER — Encounter: Payer: Self-pay | Admitting: Internal Medicine

## 2020-06-21 ENCOUNTER — Inpatient Hospital Stay: Payer: Medicare Other | Attending: Internal Medicine

## 2020-06-21 DIAGNOSIS — Z95828 Presence of other vascular implants and grafts: Secondary | ICD-10-CM

## 2020-06-21 DIAGNOSIS — C8599 Non-Hodgkin lymphoma, unspecified, extranodal and solid organ sites: Secondary | ICD-10-CM | POA: Diagnosis present

## 2020-06-21 DIAGNOSIS — Z452 Encounter for adjustment and management of vascular access device: Secondary | ICD-10-CM | POA: Insufficient documentation

## 2020-06-21 MED ORDER — HEPARIN SOD (PORK) LOCK FLUSH 100 UNIT/ML IV SOLN
500.0000 [IU] | Freq: Once | INTRAVENOUS | Status: AC
  Administered 2020-06-21: 500 [IU] via INTRAVENOUS
  Filled 2020-06-21: qty 5

## 2020-06-21 MED ORDER — SODIUM CHLORIDE 0.9% FLUSH
10.0000 mL | Freq: Once | INTRAVENOUS | Status: AC
  Administered 2020-06-21: 10 mL via INTRAVENOUS
  Filled 2020-06-21: qty 10

## 2020-06-21 MED ORDER — HEPARIN SOD (PORK) LOCK FLUSH 100 UNIT/ML IV SOLN
INTRAVENOUS | Status: AC
  Filled 2020-06-21: qty 5

## 2020-06-28 ENCOUNTER — Ambulatory Visit (INDEPENDENT_AMBULATORY_CARE_PROVIDER_SITE_OTHER): Payer: Medicare Other

## 2020-06-28 VITALS — Ht 62.0 in | Wt 171.0 lb

## 2020-06-28 DIAGNOSIS — Z0000 Encounter for general adult medical examination without abnormal findings: Secondary | ICD-10-CM | POA: Diagnosis not present

## 2020-06-28 NOTE — Progress Notes (Signed)
Subjective:   Stacy Murillo is a 65 y.o. female who presents for an Initial Medicare Annual Wellness Visit.  Review of Systems    No ROS.  Medicare Wellness Virtual Visit.   Cardiac Risk Factors include: advanced age (>78mn, >>8women)     Objective:    Today's Vitals   06/28/20 1139  Weight: 171 lb (77.6 kg)  Height: 5' 2"  (1.575 m)   Body mass index is 31.28 kg/m.  Advanced Directives 06/28/2020 04/19/2020 11/24/2019 09/22/2019 05/01/2019 05/01/2019 04/20/2019  Does Patient Have a Medical Advance Directive? Yes Yes Yes Yes Yes Yes Yes  Type of AParamedicof ASandy ValleyLiving will H[place]doLiving will H[place]Living will Healthcare Power of AWhitesboroLiving will HIndian SpringsLiving will HRobert LeeLiving will  Does patient want to make changes to medical advance directive? No - Patient declined - No - Patient declined No - Patient declined No - Patient declined No - Patient declined -  Copy of HRush[place]in Chart? No - copy requested - No - copy requested No - copy requested No - copy requested No - copy requested No - copy requested  Would patient like information on creating a medical advance directive? - - - No - Patient declined No - Patient declined No - Patient declined -  Some encounter information is confidential and restricted. Go to Review Flowsheets activity to see all data.    Current Medications (verified) Outpatient Encounter Medications as of 06/28/2020  Medication Sig  . acetaminophen (TYLENOL 8 HOUR) 650 MG CR tablet Take 650 mg by mouth every 8 (eight) hours as needed for pain.  .Marland KitchenALPRAZolam (XANAX) 0.25 MG tablet Take 1 tablet (0.25 mg total) by mouth at bedtime as needed for anxiety.  . midodrine (PROAMATINE) 5 MG tablet Take 1.5 tablets by mouth 3 (three) times daily.   . ondansetron (ZOFRAN-ODT) 4 MG disintegrating  tablet Take 1 tablet by mouth every 12 (twelve) hours.    Facility-Administered Encounter Medications as of 06/28/2020  Medication  . 0.9 %  sodium chloride infusion  . methotrexate (50 mg/ml) 6.3 g in sodium chloride 0.9 % 1,000 mL injection  . sodium chloride flush (NS) 0.9 % injection 10 mL    Allergies (verified) Ondansetron hcl, Oxycodone, and Rituximab   History: Past Medical History:  Diagnosis Date  . Arthritis   . Cancer ([place]   . Diverticulosis 07/06/15  . Dysrhythmia    tachycardia  . Esophagitis 07/06/15  . Fatigue   . Gastritis 07/06/15  . GERD (gastroesophageal reflux disease)   . 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 Leg cramps   . 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 Night sweats   . Osteoporosis   . Overweight(278.02)    Obesity  . Palpitations   . Shortness of breath dyspnea   . Tachycardia   . Thrombocytopenia (HWhite[place]    Past Surgical History:  Procedure Laterality Date  . ABDOMINAL HYSTERECTOMY  2005  . BONE MARROW BIOPSY  09/14/15  . CHOLECYSTECTOMY  1997  . COLONOSCOPY  07/05/2014  . ESOPHAGOGASTRODUODENOSCOPY  07/05/2014  . INGUINAL LYMPH NODE BIOPSY Right 10/05/2015   Procedure: INGUINAL LYMPH NODE BIOPSY;  Surgeon: SChristene Lye MD;  Location: [facility] ORS;  Service: General;  Laterality: Right;  . PERIPHERAL VASCULAR CATHETERIZATION N/A 09/27/2015   Procedure: PGlori LuisCath Insertion;  Surgeon:  Algernon Huxley, MD;  Location: [place] [facility];  Service: Cardiovascular;  Laterality: N/A;  . PORTA CATH REMOVAL Right 06/03/2017   Procedure: PORTA CATH REMOVAL, with venous thrombectomy;  Surgeon: Algernon Huxley, MD;  Location: [place] [facility];  Service: Cardiovascular;  Laterality: Right;  . PORTACATH PLACEMENT Right    Family History  Problem Relation Age of Onset  . Heart disease Father        CABG x 4  . Multiple myeloma Father   . Hypertension Mother   . Pancreatic cancer Mother    . Ulcers Mother   . Asthma Mother   . Brain cancer Maternal Uncle   . Multiple myeloma Maternal Uncle   . Diabetes Neg Hx   . Breast cancer Neg Hx    Social History   Socioeconomic History  . Marital status: Married    Spouse name: Not on file  . Number of children: Not on file  . Years of education: Not on file  . Highest education level: Not on file  Occupational History  . Not on file  Tobacco Use  . Smoking status: Never Smoker  . Smokeless tobacco: Never Used  Vaping Use  . Vaping Use: Never used  Substance and Sexual Activity  . Alcohol use: No    Alcohol/week: 0.0 standard drinks  . Drug use: No  . Sexual activity: Yes    Birth control/protection: None  Other Topics Concern  . Not on file  Social History Narrative   Married   Does not get regular exercise   Social Determinants of Health   Financial Resource Strain: Low Risk   . Difficulty of Paying Living Expenses: Not hard at all  Food Insecurity: No Food Insecurity  . Worried About Charity fundraiser in the Last Year: Never true  . Ran Out of Food in the Last Year: Never true  Transportation Needs: No Transportation Needs  . Lack of Transportation (Medical): No  . Lack of Transportation (Non-Medical): No  Physical Activity: Sufficiently Active  . Days of Exercise per Week: 5 days  . Minutes of Exercise per Session: 30 min  Stress: No Stress Concern Present  . Feeling of Stress : Not at all  Social Connections: Unknown  . Frequency of Communication with Friends and Family: Not on file  . Frequency of Social Gatherings with Friends and Family: Not on file  . Attends Religious Services: Not on file  . Active Member of Clubs or Organizations: Not on file  . Attends Archivist Meetings: Not on file  . Marital Status: Married    Tobacco Counseling Counseling given: Not Answered   Clinical Intake:  Pre-visit preparation completed: Yes        Diabetes: No  How often do you need to  have someone help you when you read instructions, pamphlets, or other written materials from your doctor or pharmacy?: 1 - Never   Interpreter Needed?: No      Activities of Daily Living In your present state of health, do you have any difficulty performing the following activities: 06/28/2020  Hearing? N  Vision? N  Difficulty concentrating or making decisions? Y  Comment Difficulty remembering since radiation/chemo. Followed by physician.  Walking or climbing stairs? N  Dressing or bathing? N  Doing errands, shopping? Y  Preparing Food and eating ? N  Using the Toilet? N  In the past six months, have you accidently leaked urine? N  Do you have problems  with loss of bowel control? N  Managing your Medications? Y  Comment Husband assist as needed  Managing your Finances? Y  Comment Husband assist as needed  Housekeeping or managing your Housekeeping? N  Some recent data might be hidden    Patient Care Team: Crecencio Mc, MD as PCP - General (Internal Medicine) Cammie Sickle, MD as Consulting Physician (Internal Medicine) Christene Lye, MD (General Surgery) [name], MD as Consulting Physician (Cardiology)  Indicate any recent Medical Services you may have received from other than [facility] providers in the past year (date may be approximate).     Assessment:   This is a routine wellness examination for Lollie.  I connected with Luma today by telephone and verified that I am speaking with the correct person using two identifiers. Location patient: home Location provider: work Persons participating in the virtual visit: patient, Marine scientist.    I discussed the limitations, risks, security and privacy concerns of performing an evaluation and management service by telephone and the availability of in person appointments. The patient expressed understanding and verbally consented to this telephonic visit.    Interactive audio and video telecommunications were  attempted between this provider and patient, however failed, due to patient having technical difficulties OR patient did not have access to video capability.  We continued and completed visit with audio only.  Some vital signs may be absent or patient reported.   Hearing/Vision screen  Hearing Screening   125Hz  250Hz  500Hz  1000Hz  2000Hz  3000Hz  4000Hz  6000Hz  8000Hz   Right ear:           Left ear:           Comments: Patient is able to hear conversational tones without difficulty.  No issues reported.    Vision Screening Comments: Wears corrective lenses Visual acuity not assessed, virtual visit.  They have seen their ophthalmologist in the last 12 months.   Dietary issues and exercise activities discussed: Current Exercise Habits: Home exercise routine, Type of exercise: walking, Time (Minutes): 30, Frequency (Times/Week): 5, Weekly Exercise (Minutes/Week): 150, Intensity: Mild  Healthy diet Good water intake  Goals    . Follow up with Primary Care Provider     Schedule mammogram as directed Schedule bone density      Depression Screen PHQ 2/9 Scores 06/28/2020 04/19/2020 12/17/2017 07/20/2014 05/31/2014  PHQ - 2 Score 0 1 0 0 0  PHQ- 9 Score - 6 1 - -    Fall Risk Fall Risk  06/28/2020 04/19/2020 05/15/2017 05/01/2017 07/20/2014  Falls in the past year? 0 1 No No No  Number falls in past yr: 0 0 - - -  Injury with Fall? 0 0 - - -  Risk for fall due to : - - - - -  Follow up Falls evaluation completed Falls evaluation completed - - -    FALL RISK PREVENTION PERTAINING TO THE HOME: Handrails in use when climbing stairs? Yes Home free of loose throw rugs in walkways, pet beds, electrical cords, etc? Yes  Adequate lighting in your home to reduce risk of falls? Yes   ASSISTIVE DEVICES UTILIZED TO PREVENT FALLS: Life alert? No  Use of a cane, walker or w/c? No  Grab bars in the bathroom? Yes  Shower chair or bench in shower? Yes  Elevated toilet seat or a handicapped toilet? Yes    TIMED UP AND GO: Was the test performed? No . Virtual visit.   Cognitive Function:  Patient is alert.  MMSE/6CIT  deferred per patient preference.  Notes difficulty remembering post radiation/chemotherapy. Followed by physician.     Immunizations Immunization History  Administered Date(s) Administered  . DTaP / Hep B / IPV 01/15/2018, 02/02/2019  . HiB (PRP-T) 01/15/2018, 02/02/2019  . Influenza Split 02/12/2011, 02/17/2012, 03/08/2014  . Influenza Whole 05/14/2009  . Influenza, High Dose Seasonal PF 02/17/2020  . Influenza,inj,Quad PF,6+ Mos 01/28/2018, 02/02/2019  . Influenza-Unspecified 03/13/2013, 01/22/2017, 01/28/2018  . Janssen (J&J) SARS-COV-2 Vaccination 08/19/2019  . MMR 12/22/2019  . Moderna Sars-Covid-2 Vaccination 02/21/2020  . Pneumococcal Conjugate-13 01/15/2018, 02/02/2019  . Pneumococcal Polysaccharide-23 12/22/2019  . Tdap 04/06/2014  . Zoster 07/18/2011  . Zoster Recombinat (Shingrix) 01/15/2018   Health Maintenance Health Maintenance  Topic Date Due  . MAMMOGRAM  11/16/2018  . TETANUS/TDAP  04/06/2024  . COLONOSCOPY (Pts 45-51yr Insurance coverage will need to be confirmed)  06/19/2024  . INFLUENZA VACCINE  Completed  . DEXA SCAN  Completed  . COVID-19 Vaccine  Completed  . Hepatitis C Screening  Completed  . PNA vac Low Risk Adult  Completed   Colorectal cancer screening: Type of screening: Colonoscopy. Completed 06/19/14. Repeat every 10 years  Bone density- plans to schedule.   Lung Cancer Screening: (Low Dose CT Chest recommended if Age 66-80years, 30 pack-year currently smoking OR have quit w/in 15years.) does not qualify.   Hepatitis C Screening: Completed 10/19/15.  Vision Screening: Recommended annual ophthalmology exams for early detection of glaucoma and other disorders of the eye. Is the patient up to date with their annual eye exam?  Yes   Dental Screening: Recommended annual dental exams for proper oral hygiene. Visits every 6  months.   Community Resource Referral / Chronic Care Management: CRR required this visit?  No   CCM required this visit?  No      Plan:   Keep all routine maintenance appointments.   Cpe 04/20/21 @ 10:00  I have personally reviewed and noted the following in the patient's chart:   . Medical and social history . Use of alcohol, tobacco or illicit drugs  . Current medications and supplements . Functional ability and status . Nutritional status . Physical activity . Advanced directives . List of other physicians . Hospitalizations, surgeries, and ER visits in previous 12 months . Vitals . Screenings to include cognitive, depression, and falls . Referrals and appointments  In addition, I have reviewed and discussed with patient certain preventive protocols, quality metrics, and best practice recommendations. A written personalized care plan for preventive services as well as general preventive health recommendations were provided to patient via mychart.     OVarney Biles LPN   26/29/5284

## 2020-06-28 NOTE — Patient Instructions (Addendum)
Stacy Murillo , Thank you for taking time to come for your Medicare Wellness Visit. I appreciate your ongoing commitment to your health goals. Please review the following plan we discussed and let me know if I can assist you in the future.   These are the goals we discussed: Goals    . Follow up with Primary Care Provider     Schedule mammogram as directed Schedule bone density       This is a list of the screening recommended for you and due dates:  Health Maintenance  Topic Date Due  . Mammogram  11/16/2018  . Tetanus Vaccine  04/06/2024  . Colon Cancer Screening  06/19/2024  . Flu Shot  Completed  . DEXA scan (bone density measurement)  Completed  . COVID-19 Vaccine  Completed  .  Hepatitis C: One time screening is recommended by Center for Disease Control  (CDC) for  adults born from [birth year] through 1966.   Completed  . Pneumonia vaccines  Completed    Immunizations Immunization History  Administered Date(s) Administered  . DTaP / Hep B / IPV 01/15/2018, 02/02/2019  . HiB (PRP-T) 01/15/2018, 02/02/2019  . Influenza Split 02/12/2011, 02/17/2012, 03/08/2014  . Influenza Whole 05/14/2009  . Influenza, High Dose Seasonal PF 02/17/2020  . Influenza,inj,Quad PF,6+ Mos 01/28/2018, 02/02/2019  . Influenza-Unspecified 03/13/2013, 01/22/2017, 01/28/2018  . Janssen (J&J) SARS-COV-2 Vaccination 08/19/2019  . MMR 12/22/2019  . Moderna Sars-Covid-2 Vaccination 02/21/2020  . Pneumococcal Conjugate-13 01/15/2018, 02/02/2019  . Pneumococcal Polysaccharide-23 12/22/2019  . Tdap 04/06/2014  . Zoster 07/18/2011  . Zoster Recombinat (Shingrix) 01/15/2018   Keep all routine maintenance appointments.   Cpe 04/20/21 @ 10:00  Advanced directives: End of life planning; Advance aging; Advanced directives discussed.  Copy of current HCPOA/Living Will requested.    Conditions/risks identified: none new.  Follow up in one year for your annual wellness visit.   Preventive Care 63 Years and  Older, Female Preventive care refers to lifestyle choices and visits with your health care provider that can promote health and wellness. What does preventive care include?  A yearly physical exam. This is also called an annual well check.  Dental exams once or twice a year.  Routine eye exams. Ask your health care provider how often you should have your eyes checked.  Personal lifestyle choices, including:  Daily care of your teeth and gums.  Regular physical activity.  Eating a healthy diet.  Avoiding tobacco and drug use.  Limiting alcohol use.  Practicing safe sex.  Taking low-dose aspirin every day.  Taking vitamin and mineral supplements as recommended by your health care provider. What happens during an annual well check? The services and screenings done by your health care provider during your annual well check will depend on your age, overall health, lifestyle risk factors, and family history of disease. Counseling  Your health care provider may ask you questions about your:  Alcohol use.  Tobacco use.  Drug use.  Emotional well-being.  Home and relationship well-being.  Sexual activity.  Eating habits.  History of falls.  Memory and ability to understand (cognition).  Work and work Statistician.  Reproductive health. Screening  You may have the following tests or measurements:  Height, weight, and BMI.  Blood pressure.  Lipid and cholesterol levels. These may be checked every 5 years, or more frequently if you are over 42 years old.  Skin check.  Lung cancer screening. You may have this screening every year starting at age 71 if  you have a 30-pack-year history of smoking and currently smoke or have quit within the past 15 years.  Fecal occult blood test (FOBT) of the stool. You may have this test every year starting at age 81.  Flexible sigmoidoscopy or colonoscopy. You may have a sigmoidoscopy every 5 years or a colonoscopy every 10 years  starting at age 75.  Hepatitis C blood test.  Hepatitis B blood test.  Sexually transmitted disease (STD) testing.  Diabetes screening. This is done by checking your blood sugar (glucose) after you have not eaten for a while (fasting). You may have this done every 1-3 years.  Bone density scan. This is done to screen for osteoporosis. You may have this done starting at age 68.  Mammogram. This may be done every 1-2 years. Talk to your health care provider about how often you should have regular mammograms. Talk with your health care provider about your test results, treatment options, and if necessary, the need for more tests. Vaccines  Your health care provider may recommend certain vaccines, such as:  Influenza vaccine. This is recommended every year.  Tetanus, diphtheria, and acellular pertussis (Tdap, Td) vaccine. You may need a Td booster every 10 years.  Zoster vaccine. You may need this after age 29.  Pneumococcal 13-valent conjugate (PCV13) vaccine. One dose is recommended after age 74.  Pneumococcal polysaccharide (PPSV23) vaccine. One dose is recommended after age 67. Talk to your health care provider about which screenings and vaccines you need and how often you need them. This information is not intended to replace advice given to you by your health care provider. Make sure you discuss any questions you have with your health care provider. Document Released: 05/27/2015 Document Revised: 01/18/2016 Document Reviewed: 03/01/2015 Elsevier Interactive Patient Education  2017 [employer] Prevention in the Home Falls can cause injuries. They can happen to people of all ages. There are many things you can do to make your home safe and to help prevent falls. What can I do on the outside of my home?  Regularly fix the edges of walkways and driveways and fix any cracks.  Remove anything that might make you trip as you walk through a door, such as a raised step or  threshold.  Trim any bushes or trees on the path to your home.  Use bright outdoor lighting.  Clear any walking paths of anything that might make someone trip, such as rocks or tools.  Regularly check to see if handrails are loose or broken. Make sure that both sides of any steps have handrails.  Any raised decks and porches should have guardrails on the edges.  Have any leaves, snow, or ice cleared regularly.  Use sand or salt on walking paths during winter.  Clean up any spills in your garage right away. This includes oil or grease spills. What can I do in the bathroom?  Use night lights.  Install grab bars by the toilet and in the tub and shower. Do not use towel bars as grab bars.  Use non-skid mats or decals in the tub or shower.  If you need to sit down in the shower, use a plastic, non-slip stool.  Keep the floor dry. Clean up any water that spills on the floor as soon as it happens.  Remove soap buildup in the tub or shower regularly.  Attach bath mats securely with double-sided non-slip rug tape.  Do not have throw rugs and other things on the floor that  can make you trip. What can I do in the bedroom?  Use night lights.  Make sure that you have a light by your bed that is easy to reach.  Do not use any sheets or blankets that are too big for your bed. They should not hang down onto the floor.  Have a firm chair that has side arms. You can use this for support while you get dressed.  Do not have throw rugs and other things on the floor that can make you trip. What can I do in the kitchen?  Clean up any spills right away.  Avoid walking on wet floors.  Keep items that you use a lot in easy-to-reach places.  If you need to reach something above you, use a strong step stool that has a grab bar.  Keep electrical cords out of the way.  Do not use floor polish or wax that makes floors slippery. If you must use wax, use non-skid floor wax.  Do not have  throw rugs and other things on the floor that can make you trip. What can I do with my stairs?  Do not leave any items on the stairs.  Make sure that there are handrails on both sides of the stairs and use them. Fix handrails that are broken or loose. Make sure that handrails are as long as the stairways.  Check any carpeting to make sure that it is firmly attached to the stairs. Fix any carpet that is loose or worn.  Avoid having throw rugs at the top or bottom of the stairs. If you do have throw rugs, attach them to the floor with carpet tape.  Make sure that you have a light switch at the top of the stairs and the bottom of the stairs. If you do not have them, ask someone to add them for you. What else can I do to help prevent falls?  Wear shoes that:  Do not have high heels.  Have rubber bottoms.  Are comfortable and fit you well.  Are closed at the toe. Do not wear sandals.  If you use a stepladder:  Make sure that it is fully opened. Do not climb a closed stepladder.  Make sure that both sides of the stepladder are locked into place.  Ask someone to hold it for you, if possible.  Clearly mark and make sure that you can see:  Any grab bars or handrails.  First and last steps.  Where the edge of each step is.  Use tools that help you move around (mobility aids) if they are needed. These include:  Canes.  Walkers.  Scooters.  Crutches.  Turn on the lights when you go into a dark area. Replace any light bulbs as soon as they burn out.  Set up your furniture so you have a clear path. Avoid moving your furniture around.  If any of your floors are uneven, fix them.  If there are any pets around you, be aware of where they are.  Review your medicines with your doctor. Some medicines can make you feel dizzy. This can increase your chance of falling. Ask your doctor what other things that you can do to help prevent falls. This information is not intended to  replace advice given to you by your health care provider. Make sure you discuss any questions you have with your health care provider. Document Released: 02/24/2009 Document Revised: 10/06/2015 Document Reviewed: 06/04/2014 Elsevier Interactive Patient Education  2017 [employer].

## 2020-07-15 ENCOUNTER — Other Ambulatory Visit: Payer: Self-pay

## 2020-07-15 ENCOUNTER — Inpatient Hospital Stay (HOSPITAL_BASED_OUTPATIENT_CLINIC_OR_DEPARTMENT_OTHER): Payer: Medicare Other | Admitting: Nurse Practitioner

## 2020-07-15 ENCOUNTER — Encounter: Payer: Self-pay | Admitting: Internal Medicine

## 2020-07-15 ENCOUNTER — Ambulatory Visit
Admission: RE | Admit: 2020-07-15 | Discharge: 2020-07-15 | Disposition: A | Discharge status: Home / Self Care | Payer: Medicare Other | Source: Ambulatory Visit | Attending: Nurse Practitioner | Admitting: Nurse Practitioner

## 2020-07-15 ENCOUNTER — Ambulatory Visit
Admission: RE | Admit: 2020-07-15 | Discharge: 2020-07-15 | Disposition: A | Discharge status: Home / Self Care | Payer: Medicare Other | Source: Home / Self Care | Attending: Nurse Practitioner | Admitting: Nurse Practitioner

## 2020-07-15 ENCOUNTER — Inpatient Hospital Stay: Payer: Medicare Other

## 2020-07-15 ENCOUNTER — Inpatient Hospital Stay: Payer: Medicare Other | Attending: Internal Medicine

## 2020-07-15 ENCOUNTER — Encounter: Payer: Self-pay | Admitting: Nurse Practitioner

## 2020-07-15 ENCOUNTER — Telehealth: Payer: Self-pay

## 2020-07-15 ENCOUNTER — Other Ambulatory Visit: Payer: Self-pay | Admitting: *Deleted

## 2020-07-15 VITALS — BP 119/64 | HR 64 | Temp 97.9°F | Resp 20

## 2020-07-15 DIAGNOSIS — M79605 Pain in left leg: Secondary | ICD-10-CM | POA: Insufficient documentation

## 2020-07-15 DIAGNOSIS — C8599 Non-Hodgkin lymphoma, unspecified, extranodal and solid organ sites: Secondary | ICD-10-CM

## 2020-07-15 DIAGNOSIS — M25562 Pain in left knee: Secondary | ICD-10-CM | POA: Insufficient documentation

## 2020-07-15 DIAGNOSIS — Z8572 Personal history of non-Hodgkin lymphomas: Secondary | ICD-10-CM | POA: Insufficient documentation

## 2020-07-15 DIAGNOSIS — Z95828 Presence of other vascular implants and grafts: Secondary | ICD-10-CM

## 2020-07-15 DIAGNOSIS — Z791 Long term (current) use of non-steroidal anti-inflammatories (NSAID): Secondary | ICD-10-CM | POA: Diagnosis not present

## 2020-07-15 DIAGNOSIS — Z9484 Stem cells transplant status: Secondary | ICD-10-CM | POA: Diagnosis not present

## 2020-07-15 DIAGNOSIS — M238X2 Other internal derangements of left knee: Secondary | ICD-10-CM | POA: Insufficient documentation

## 2020-07-15 DIAGNOSIS — Z9221 Personal history of antineoplastic chemotherapy: Secondary | ICD-10-CM | POA: Insufficient documentation

## 2020-07-15 LAB — COMPREHENSIVE METABOLIC PANEL
ALT: 43 U/L (ref 0–44)
AST: 33 U/L (ref 15–41)
Albumin: 4.2 g/dL (ref 3.5–5.0)
Alkaline Phosphatase: 89 U/L (ref 38–126)
Anion gap: 10 (ref 5–15)
BUN: 13 mg/dL (ref 8–23)
CO2: 27 mmol/L (ref 22–32)
Calcium: 9.3 mg/dL (ref 8.9–10.3)
Chloride: 103 mmol/L (ref 98–111)
Creatinine, Ser: 1.1 mg/dL — ABNORMAL HIGH (ref 0.44–1.00)
GFR, Estimated: 55 mL/min — ABNORMAL LOW (ref >60)
Glucose, Bld: 111 mg/dL — ABNORMAL HIGH (ref 70–99)
Potassium: 4.8 mmol/L (ref 3.5–5.1)
Sodium: 140 mmol/L (ref 135–145)
Total Bilirubin: 0.6 mg/dL (ref 0.3–1.2)
Total Protein: 6.1 g/dL — ABNORMAL LOW (ref 6.5–8.1)

## 2020-07-15 LAB — CBC WITH DIFFERENTIAL/PLATELET
Abs Immature Granulocytes: 0.01 10*3/uL (ref 0.00–0.07)
Basophils Absolute: 0 10*3/uL (ref 0.0–0.1)
Basophils Relative: 1 %
Eosinophils Absolute: 0.1 10*3/uL (ref 0.0–0.5)
Eosinophils Relative: 2 %
HCT: 36.9 % (ref 36.0–46.0)
Hemoglobin: 12.6 g/dL (ref 12.0–15.0)
Immature Granulocytes: 0 %
Lymphocytes Relative: 13 %
Lymphs Abs: 0.5 10*3/uL — ABNORMAL LOW (ref 0.7–4.0)
MCH: 31 pg (ref 26.0–34.0)
MCHC: 34.1 g/dL (ref 30.0–36.0)
MCV: 90.9 fL (ref 80.0–100.0)
Monocytes Absolute: 0.6 10*3/uL (ref 0.1–1.0)
Monocytes Relative: 18 %
Neutro Abs: 2.4 10*3/uL (ref 1.7–7.7)
Neutrophils Relative %: 66 %
Platelets: 87 10*3/uL — ABNORMAL LOW (ref 150–400)
RBC: 4.06 MIL/uL (ref 3.87–5.11)
RDW: 12.3 % (ref 11.5–15.5)
Smear Review: NORMAL
WBC: 3.6 10*3/uL — ABNORMAL LOW (ref 4.0–10.5)
nRBC: 0 % (ref 0.0–0.2)

## 2020-07-15 LAB — LACTATE DEHYDROGENASE: LDH: 190 U/L (ref 98–192)

## 2020-07-15 LAB — MAGNESIUM: Magnesium: 2.3 mg/dL (ref 1.7–2.4)

## 2020-07-15 MED ORDER — ONDANSETRON HCL 4 MG PO TABS
8.0000 mg | ORAL_TABLET | Freq: Once | ORAL | Status: AC
  Administered 2020-07-15: 8 mg via ORAL
  Filled 2020-07-15: qty 2

## 2020-07-15 MED ORDER — ALTEPLASE 2 MG IJ SOLR
2.0000 mg | Freq: Once | INTRAMUSCULAR | Status: DC
  Filled 2020-07-15: qty 2

## 2020-07-15 MED ORDER — ALTEPLASE 2 MG IJ SOLR
2.0000 mg | Freq: Once | INTRAMUSCULAR | Status: AC
  Administered 2020-07-15: 2 mg
  Filled 2020-07-15: qty 2

## 2020-07-15 MED ORDER — SODIUM CHLORIDE 0.9% FLUSH
10.0000 mL | Freq: Once | INTRAVENOUS | Status: AC
  Administered 2020-07-15: 10 mL via INTRAVENOUS
  Filled 2020-07-15: qty 10

## 2020-07-15 MED ORDER — HEPARIN SOD (PORK) LOCK FLUSH 100 UNIT/ML IV SOLN
500.0000 [IU] | Freq: Once | INTRAVENOUS | Status: AC
  Administered 2020-07-15: 500 [IU] via INTRAVENOUS
  Filled 2020-07-15: qty 5

## 2020-07-15 MED ORDER — OXYCODONE HCL 5 MG PO TABS
5.0000 mg | ORAL_TABLET | Freq: Once | ORAL | Status: AC
  Administered 2020-07-15: 5 mg via ORAL
  Filled 2020-07-15: qty 1

## 2020-07-15 MED ORDER — OXYCODONE HCL 5 MG PO TABS: 5.0000 mg | ORAL_TABLET | Freq: Four times a day (QID) | ORAL | 0 refills | Status: DC | PRN

## 2020-07-15 NOTE — Telephone Encounter (Signed)
Re: last MyChart message: Spoke to Wille Glaser, pt's husband, and informed him that Dr. Rogue Bussing recommends labs and to be seen in [facility]. Joe agrees to this and will have pt here at 1030.

## 2020-07-15 NOTE — Progress Notes (Signed)
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 CT- 1-2 CM LN subpectoral/medistinal/ retro-peritoneal/pelvic/ Right inguinal LN 1.6cm- Bx- DLBCL- ABC; myc-POS; FISH gene re-arragement-NEG. PET- MULTIPLE LN/ Bone involvement; R-CHOP x6- CR'# OCT 2017- PET scan- CR; DEC 22nd 2017- BMBx- "atypical large B cells- <1%  [[facility]; II opinion- Dr.Grover- review of path- not concerning for residual lymphoma]; recommend surviellance  # March 26th  2018PET- NED  # Lumbar puncture- difficult-spinal headache. S/p high dose MXT x4.  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   # OCT 2020- MASS  spanning the corpus callosum. Brain Biopsy  at [facility]-DLBCL. CD20+. EBV -, Ki67+, Pax5+. Port placed 10/27, R-MPV started 10/27 [facility]; Dr.Grover]. s/p WBRT ; MARCH 31st- MRI- remission/Stable disease   ---------------------------------------------------------- # June 2017-Elevated LFTs- valacylovir/diflucan; resolved.   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   # May 2017- EF- 55-65%  #August 2019-double vision; secondary to refractive error-prism improved. 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  Pain started a few days ago and has gradually worsened in intensity since onset.  She was seen by orthopedics yesterday with x-ray showing arthritis.  She was prescribed meloxicam which she is now taken 2 doses of and feels the pain is worsening.  Alternating with Tylenol without relief.  Complains of shooting pain that starts the medial left knee and radiates up and down her leg. Keeps her from sleeping. Was offered steroid injection yesterday but she declined.   Review of systems- Review of Systems  Constitutional: Negative for chills, diaphoresis, fever, malaise/fatigue and weight loss.  HENT: Negative for congestion, ear discharge, ear pain, nosebleeds, sinus pain and sore throat.   Eyes: Negative for pain and redness.  Respiratory: Negative for cough, hemoptysis, sputum production, shortness of breath, wheezing and stridor.   Cardiovascular: Negative for chest pain, palpitations and leg swelling.  Gastrointestinal: Negative for abdominal pain, constipation, diarrhea, nausea and vomiting.  Musculoskeletal: Positive for joint pain. Negative for falls and myalgias.  Skin: Negative for rash.  Neurological: Negative for dizziness, loss of consciousness, weakness and headaches.   Psychiatric/Behavioral: Positive for memory loss. Negative for depression. The patient is not nervous/anxious and does not have insomnia.  All other systems reviewed and are negative.    Allergies  Allergen Reactions  . Ondansetron Hcl Other (See Comments)    Severe constipation   . Oxycodone     Biliary colic with opioids s/p cholecystectomy    . Rituximab Other (See Comments)    Pt reports throat tightness and facial swelling and redness during infusion of Rituxan    Past Medical History:  Diagnosis Date  . Arthritis   . Cancer ([place])   . Diverticulosis 07/06/15  . Dysrhythmia    tachycardia  . Esophagitis 07/06/15  . Fatigue   . Gastritis 07/06/15  . GERD (gastroesophageal reflux disease)   . 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 Leg cramps   . Lymphoma ([place]) 09/14/15   Monoclonal B cell lymphoma  . Night sweats   . Osteoporosis   . Overweight(278.02)    Obesity  . Palpitations   . Shortness of breath dyspnea   . Tachycardia   . Thrombocytopenia ([place])     Past Surgical History:  Procedure Laterality Date  . ABDOMINAL HYSTERECTOMY  2005  . BONE MARROW BIOPSY  09/14/15  . CHOLECYSTECTOMY  1997  . COLONOSCOPY  07/05/2014  . ESOPHAGOGASTRODUODENOSCOPY  07/05/2014  . INGUINAL LYMPH NODE BIOPSY Right 10/05/2015   Procedure: INGUINAL LYMPH NODE BIOPSY;  Surgeon: Christene Lye, MD;  Location: [facility] ORS;  Service: General;  Laterality: Right;  . PERIPHERAL VASCULAR CATHETERIZATION N/A 09/27/2015   Procedure: Glori Luis Cath Insertion;  Surgeon: Algernon Huxley, MD;  Location: [place] [facility];  Service: Cardiovascular;  Laterality: N/A;  . PORTA CATH REMOVAL Right 06/03/2017   Procedure: PORTA CATH REMOVAL, with venous thrombectomy;  Surgeon: Algernon Huxley, MD;  Location: [place] [facility];  Service: Cardiovascular;  Laterality: Right;  . PORTACATH PLACEMENT Right     Social History   Socioeconomic History  . Marital status:  Married    Spouse name: Not on file  . Number of children: Not on file  . Years of education: Not on file  . Highest education level: Not on file  Occupational History  . Not on file  Tobacco Use  . Smoking status: Never Smoker  . Smokeless tobacco: Never Used  Vaping Use  . Vaping Use: Never used  Substance and Sexual Activity  . Alcohol use: No    Alcohol/week: 0.0 standard drinks  . Drug use: No  . Sexual activity: Yes    Birth control/protection: None  Other Topics Concern  . Not on file  Social History Narrative   Married   Does not get regular exercise   Social Determinants of Health   Financial Resource Strain: Low Risk   . Difficulty of Paying Living Expenses: Not hard at all  Food Insecurity: No Food Insecurity  . Worried About Charity fundraiser in the Last Year: Never true  . Ran Out of Food in the Last Year: Never true  Transportation Needs: No Transportation Needs  . Lack of Transportation (Medical): No  . Lack of Transportation (Non-Medical): No  Physical Activity: Sufficiently Active  . Days of Exercise per Week: 5 days  . Minutes of Exercise per Session: 30 min  Stress: No Stress Concern Present  . Feeling of Stress : Not at all  Social Connections: Unknown  . Frequency of Communication with Friends and Family: Not on file  . Frequency of Social Gatherings with Friends and Family: Not on file  . Attends Religious  Services: Not on file  . Active Member of Clubs or Organizations: Not on file  . Attends Archivist Meetings: Not on file  . Marital Status: Married  Human resources officer Violence: Not At Risk  . Fear of Current or Ex-Partner: No  . Emotionally Abused: No  . Physically Abused: No  . Sexually Abused: No    Family History  Problem Relation Age of [place]  . Heart disease Father        CABG x 4  . Multiple myeloma Father   . Hypertension Mother   . Pancreatic cancer Mother   . Ulcers Mother   . Asthma Mother   . Brain cancer  Maternal Uncle   . Multiple myeloma Maternal Uncle   . Diabetes Neg Hx   . Breast cancer Neg Hx      Current Outpatient Medications:  .  acetaminophen (TYLENOL 8 HOUR) 650 MG CR tablet, Take 650 mg by mouth every 8 (eight) hours as needed for pain., Disp: , Rfl:  .  ALPRAZolam (XANAX) 0.25 MG tablet, Take 1 tablet (0.25 mg total) by mouth at bedtime as needed for anxiety., Disp: 20 tablet, Rfl: 0 .  midodrine (PROAMATINE) 5 MG tablet, Take 1.5 tablets by mouth 3 (three) times daily. , Disp: , Rfl:  .  ondansetron (ZOFRAN-ODT) 4 MG disintegrating tablet, Take 1 tablet by mouth every 12 (twelve) hours. , Disp: , Rfl:  No current facility-administered medications for this visit.  Facility-Administered Medications Ordered in Other Visits:  .  0.9 %  sodium chloride infusion, , Intravenous, Continuous, Herring, Orville Govern, NP, Stopped at 10/14/15 1312 .  methotrexate (50 mg/ml) 6.3 g in sodium chloride 0.9 % 1,000 mL injection, , Intravenous, Once, Brahmanday, Govinda R, MD .  sodium chloride flush (NS) 0.9 % injection 10 mL, 10 mL, Intravenous, PRN, Charlaine Dalton R, MD, 10 mL at 10/05/16 1400  Physical exam:  Vitals:   07/15/20 1100  BP: 119/64  Pulse: 64  Resp: 20  Temp: 97.9 F (36.6 C)  TempSrc: Tympanic  SpO2: 100%   Physical Exam Constitutional:      Appearance: She is well-developed and well-nourished.  HENT:     Head: Atraumatic.     Nose: Nose normal.     Mouth/Throat:     Mouth: Oropharynx is clear and moist.     Pharynx: No oropharyngeal exudate.  Eyes:     General: No scleral icterus.    Conjunctiva/sclera: Conjunctivae normal.     Pupils: Pupils are equal, round, and reactive to light.  Cardiovascular:     Rate and Rhythm: Normal rate and regular rhythm.  Pulmonary:     Effort: Pulmonary effort is normal.     Breath sounds: Normal breath sounds.  Abdominal:     General: Bowel sounds are normal. There is no distension.     Palpations: Abdomen is soft.   Musculoskeletal:        General: Tenderness present. No swelling, deformity, signs of injury or edema. Normal range of motion.     Cervical back: Normal range of motion and neck supple.     Right knee: No swelling, deformity, effusion, erythema or crepitus. Normal range of motion. No tenderness.     Left knee: Crepitus present. No swelling, deformity, effusion, erythema or bony tenderness. Normal range of motion. Tenderness present over the MCL. No medial joint line, LCL, ACL or PCL tenderness. No LCL laxity, MCL laxity, ACL laxity or PCL laxity.Normal alignment.  Instability Tests: Anterior drawer test negative. Posterior drawer test negative.     Right lower leg: No edema.     Left lower leg: No edema.  Skin:    General: Skin is warm and dry.  Neurological:     Mental Status: She is alert and oriented to person, place, and time.  Psychiatric:        Mood and Affect: Mood and affect normal.      CMP Latest Ref Rng & Units 04/19/2020  Glucose 70 - 99 mg/dL 129(H)  BUN 8 - 23 mg/dL 15  Creatinine 0.44 - 1.00 mg/dL 1.14(H)  Sodium 135 - 145 mmol/L 138  Potassium 3.5 - 5.1 mmol/L 3.7  Chloride 98 - 111 mmol/L 103  CO2 22 - 32 mmol/L 29  Calcium 8.9 - 10.3 mg/dL 9.3  Total Protein 6.5 - 8.1 g/dL 6.0(L)  Total Bilirubin 0.3 - 1.2 mg/dL 0.6  Alkaline Phos 38 - 126 U/L 67  AST 15 - 41 U/L 19  ALT 0 - 44 U/L 12   CBC Latest Ref Rng & Units 04/19/2020  WBC 4.0 - 10.5 K/uL 4.4  Hemoglobin 12.0 - 15.0 g/dL 12.0  Hematocrit 36.0 - 46.0 % 34.2(L)  Platelets 150 - 400 K/uL 131(L)    No images are attached to the encounter.  No results found.  Assessment and plan- Patient is a 65 y.o. female who presents to Symptom Management Clinic for left knee pain. Etiology unclear. Imaging at [facility] revealed arthritis per patient. Crepitus on exam. Will get imaging of hip and femur given reproducible pain today. If negative, discussed u/s with Dr. Rogue Bussing and possible pet scan in setting of DLBCL.  Dr. Rogue Bussing independently assessed patient and contributed to plan of care. Oxycodone for pain in clinic with improvement. Zofran prior to for nausea prophylaxis. Start oxycodone 5 mg q6h prn for severe pain unrelieved by Tylenol. Bowel prophylaxis recommended. Fall precautions reviewed.   Return to clinic if symptoms worsen or don't improve in interim.    Visit Diagnosis 1. Left leg pain   2. Secondary non-EBV mediated lymphoma of brain [facility])     Patient expressed understanding and was in agreement with this plan. She also understands that She can call clinic at any time with any questions, concerns, or complaints.   Thank you for allowing me to participate in the care of this very pleasant patient.   Beckey Rutter, DNP, AGNP-C [place] [facility] [place]

## 2020-07-19 ENCOUNTER — Ambulatory Visit
Admission: RE | Admit: 2020-07-19 | Discharge: 2020-07-19 | Disposition: A | Discharge status: Home / Self Care | Payer: Medicare Other | Source: Ambulatory Visit | Attending: Nurse Practitioner | Admitting: Nurse Practitioner

## 2020-07-19 ENCOUNTER — Other Ambulatory Visit: Payer: Self-pay

## 2020-07-19 DIAGNOSIS — M79605 Pain in left leg: Secondary | ICD-10-CM | POA: Insufficient documentation

## 2020-07-21 ENCOUNTER — Telehealth: Payer: Self-pay | Admitting: Nurse Practitioner

## 2020-07-21 ENCOUNTER — Encounter: Payer: Self-pay | Admitting: Nurse Practitioner

## 2020-07-21 ENCOUNTER — Other Ambulatory Visit: Payer: Self-pay | Admitting: Nurse Practitioner

## 2020-07-21 DIAGNOSIS — M79605 Pain in left leg: Secondary | ICD-10-CM

## 2020-07-21 MED ORDER — ONDANSETRON 4 MG PO TBDP: 4.0000 mg | ORAL_TABLET | Freq: Three times a day (TID) | ORAL | 2 refills | Status: DC | PRN

## 2020-07-21 NOTE — Progress Notes (Signed)
Images reviewed with Dr. Rogue Bussing. Etiology unclear. Recommend evaluation with dermatology as planned. Patient previously had elevation of LFTs when taking valtrex. Husband to update [place] once patient has seen dermatology.

## 2020-07-21 NOTE — Telephone Encounter (Signed)
Spoke to husband regarding results of xrays which were unremarkable for etiology of patient's pain. Patient continues to have pain and now has rash in the area of pain. He had questioned shingles and has made appointment to see dermatology tomorrow. He will send picture of rash to [place] via mychart for review. If clinically consistent with shingles, suspect this is  Underlying etiology for her pain. However, if not, plan for whole body pet scan at Dr. Aletha Halim recommendation. Patient's husband in agreement.

## 2020-07-22 ENCOUNTER — Encounter: Payer: Self-pay | Admitting: Nurse Practitioner

## 2020-07-22 ENCOUNTER — Other Ambulatory Visit: Payer: Self-pay | Admitting: Internal Medicine

## 2020-07-22 NOTE — Telephone Encounter (Signed)
Refilled: 04/19/2020 Last OV: 04/19/2020 Next OV: 04/20/2021

## 2020-07-23 ENCOUNTER — Encounter: Payer: Self-pay | Admitting: Internal Medicine

## 2020-07-28 ENCOUNTER — Telehealth: Payer: Self-pay | Admitting: *Deleted

## 2020-07-28 NOTE — Telephone Encounter (Signed)
Patient reports that her knee pain has improved. It's not completely resolved. The rash is almost gone. She is still waiting on the dermatology report.  She would like to wait another week before considering any additional imaging is considered.  Husband is optimistic that patient is getting better. Patient stated that tylenol is working for her pain. She takes one oxycodone at bedtime to help with the pain.  Husband/pt will call cancer center next week with their decision whether or not to pursue imaging.  I will touch base with them in 1 week unless husband messages the office sooner.

## 2020-07-29 ENCOUNTER — Encounter: Payer: Self-pay | Admitting: Internal Medicine

## 2020-07-29 NOTE — Telephone Encounter (Signed)
Lauren - path report from leg skin bx on the chart - consistent with shingles.

## 2020-08-01 ENCOUNTER — Telehealth: Payer: Self-pay | Admitting: Nurse Practitioner

## 2020-08-01 DIAGNOSIS — B029 Zoster without complications: Secondary | ICD-10-CM

## 2020-08-01 NOTE — Telephone Encounter (Signed)
Spoke to patient's husband. Pain is improving gradually. Lesions have begun to crust over. They will keep follow up with ortho as scheduled. Advised that if pain persists or worsen to notify cancer center so that we could consider imaging.

## 2020-08-02 ENCOUNTER — Telehealth: Payer: Self-pay | Admitting: Internal Medicine

## 2020-08-02 NOTE — Telephone Encounter (Signed)
On 3/22-spoke to patient/husband regarding hip pain; recent diagnosis of shingles-currently on patient.  Awaiting reevaluation with orthopedics.  If no clear explanation noted for the ongoing hip pain-recommend PET scan.  Patient/husband will inform/update [place]. Keep appt as planned. GB

## 2020-08-03 NOTE — Telephone Encounter (Signed)
So do we need to order PET or are we holding off for the moment

## 2020-08-03 NOTE — Telephone Encounter (Signed)
HOLD PET for now. GB

## 2020-08-04 ENCOUNTER — Encounter: Payer: Self-pay | Admitting: Internal Medicine

## 2020-08-07 ENCOUNTER — Other Ambulatory Visit: Payer: Self-pay | Admitting: Internal Medicine

## 2020-08-07 MED ORDER — GABAPENTIN 300 MG PO CAPS: 300.0000 mg | ORAL_CAPSULE | Freq: Two times a day (BID) | ORAL | 3 refills | Status: DC

## 2020-08-19 ENCOUNTER — Ambulatory Visit
Admission: RE | Admit: 2020-08-19 | Discharge: 2020-08-19 | Disposition: A | Discharge status: Home / Self Care | Payer: Medicare Other | Source: Ambulatory Visit | Attending: Internal Medicine | Admitting: Internal Medicine

## 2020-08-19 ENCOUNTER — Other Ambulatory Visit: Payer: Self-pay

## 2020-08-19 DIAGNOSIS — C8599 Non-Hodgkin lymphoma, unspecified, extranodal and solid organ sites: Secondary | ICD-10-CM

## 2020-08-19 MED ORDER — GADOBUTROL 1 MMOL/ML IV SOLN
7.5000 mL | Freq: Once | INTRAVENOUS | Status: AC | PRN
  Administered 2020-08-19: 7.5 mL via INTRAVENOUS

## 2020-08-23 ENCOUNTER — Inpatient Hospital Stay: Payer: Medicare Other | Attending: Internal Medicine

## 2020-08-23 ENCOUNTER — Inpatient Hospital Stay (HOSPITAL_BASED_OUTPATIENT_CLINIC_OR_DEPARTMENT_OTHER): Payer: Medicare Other | Admitting: Internal Medicine

## 2020-08-23 ENCOUNTER — Other Ambulatory Visit: Payer: Self-pay | Admitting: *Deleted

## 2020-08-23 ENCOUNTER — Other Ambulatory Visit: Payer: Self-pay

## 2020-08-23 DIAGNOSIS — C8338 Diffuse large B-cell lymphoma, lymph nodes of multiple sites: Secondary | ICD-10-CM | POA: Diagnosis present

## 2020-08-23 DIAGNOSIS — I951 Orthostatic hypotension: Secondary | ICD-10-CM | POA: Insufficient documentation

## 2020-08-23 DIAGNOSIS — C8583 Other specified types of non-Hodgkin lymphoma, intra-abdominal lymph nodes: Secondary | ICD-10-CM

## 2020-08-23 DIAGNOSIS — R530 Neoplastic (malignant) related fatigue: Secondary | ICD-10-CM | POA: Diagnosis not present

## 2020-08-23 DIAGNOSIS — C8599 Non-Hodgkin lymphoma, unspecified, extranodal and solid organ sites: Secondary | ICD-10-CM

## 2020-08-23 LAB — COMPREHENSIVE METABOLIC PANEL
ALT: 16 U/L (ref 0–44)
AST: 20 U/L (ref 15–41)
Albumin: 4.5 g/dL (ref 3.5–5.0)
Alkaline Phosphatase: 72 U/L (ref 38–126)
Anion gap: 9 (ref 5–15)
BUN: 21 mg/dL (ref 8–23)
CO2: 26 mmol/L (ref 22–32)
Calcium: 9.2 mg/dL (ref 8.9–10.3)
Chloride: 105 mmol/L (ref 98–111)
Creatinine, Ser: 1.13 mg/dL — ABNORMAL HIGH (ref 0.44–1.00)
GFR, Estimated: 54 mL/min — ABNORMAL LOW (ref >60)
Glucose, Bld: 110 mg/dL — ABNORMAL HIGH (ref 70–99)
Potassium: 3.9 mmol/L (ref 3.5–5.1)
Sodium: 140 mmol/L (ref 135–145)
Total Bilirubin: 0.6 mg/dL (ref 0.3–1.2)
Total Protein: 6.2 g/dL — ABNORMAL LOW (ref 6.5–8.1)

## 2020-08-23 LAB — CBC WITH DIFFERENTIAL/PLATELET
Abs Immature Granulocytes: 0.03 10*3/uL (ref 0.00–0.07)
Basophils Absolute: 0 10*3/uL (ref 0.0–0.1)
Basophils Relative: 1 %
Eosinophils Absolute: 0.1 10*3/uL (ref 0.0–0.5)
Eosinophils Relative: 1 %
HCT: 34.2 % — ABNORMAL LOW (ref 36.0–46.0)
Hemoglobin: 11.8 g/dL — ABNORMAL LOW (ref 12.0–15.0)
Immature Granulocytes: 1 %
Lymphocytes Relative: 11 %
Lymphs Abs: 0.6 10*3/uL — ABNORMAL LOW (ref 0.7–4.0)
MCH: 31.8 pg (ref 26.0–34.0)
MCHC: 34.5 g/dL (ref 30.0–36.0)
MCV: 92.2 fL (ref 80.0–100.0)
Monocytes Absolute: 0.5 10*3/uL (ref 0.1–1.0)
Monocytes Relative: 11 %
Neutro Abs: 3.7 10*3/uL (ref 1.7–7.7)
Neutrophils Relative %: 75 %
Platelets: 129 10*3/uL — ABNORMAL LOW (ref 150–400)
RBC: 3.71 MIL/uL — ABNORMAL LOW (ref 3.87–5.11)
RDW: 14.5 % (ref 11.5–15.5)
WBC: 4.9 10*3/uL (ref 4.0–10.5)
nRBC: 0 % (ref 0.0–0.2)

## 2020-08-23 LAB — LACTATE DEHYDROGENASE: LDH: 134 U/L (ref 98–192)

## 2020-08-23 MED ORDER — HEPARIN SOD (PORK) LOCK FLUSH 100 UNIT/ML IV SOLN
500.0000 [IU] | Freq: Once | INTRAVENOUS | Status: AC
Start: 2020-08-23 — End: 2020-08-23
  Administered 2020-08-23: 500 [IU] via INTRAVENOUS
  Filled 2020-08-23: qty 5

## 2020-08-23 MED ORDER — SODIUM CHLORIDE 0.9% FLUSH
10.0000 mL | INTRAVENOUS | Status: DC | PRN
  Administered 2020-08-23: 10 mL via INTRAVENOUS
  Filled 2020-08-23: qty 10

## 2020-08-23 MED ORDER — HEPARIN SOD (PORK) LOCK FLUSH 100 UNIT/ML IV SOLN
500.0000 [IU] | Freq: Once | INTRAVENOUS | Status: AC
  Administered 2020-08-23: 500 [IU] via INTRAVENOUS
  Filled 2020-08-23: qty 5

## 2020-08-23 NOTE — Progress Notes (Signed)
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 CT- 1-2 CM LN subpectoral/medistinal/ retro-peritoneal/pelvic/ Right inguinal LN 1.6cm- Bx- DLBCL- ABC; myc-POS; FISH gene re-arragement-NEG. PET- MULTIPLE LN/ Bone involvement; R-CHOP x6- CR'# OCT 2017- PET scan- CR; DEC 22nd 2017- BMBx- "atypical large B cells- <1%  [[facility]; II opinion- Dr.Grover- review of path- not concerning for residual lymphoma]; recommend surviellance  # March 26th  2018PET- NED  # Lumbar puncture- difficult-spinal headache. S/p high dose MXT x4.  ------------------------------------------------------------------- # NOV 2018-recurrent diffuse large B-cell lymphoma [right axilla lymph node biopsy proven]; PET- multiple bone lesions; axillary adenopathy; splenomegaly uptake  # Nov 16th 2018- R-; s/p 3 cycles-CR' Auto-Stem cell Transplant date: 07/11/17 [facility]; Dr.Vincent]; May 1st 2019- PET CR.   # OCT 2020- MASS  spanning the corpus callosum. Brain Biopsy  at [facility]-DLBCL. CD20+. EBV -, Ki67+, Pax5+. Port placed 10/27, R-MPV started 10/27 [facility]; Dr.Grover]. s/p WBRT ; MARCH 31st- MRI- remission/Stable disease   ---------------------------------------------------------- # June 2017-Elevated LFTs- valacylovir/diflucan; resolved.   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   # May 2017- EF- 55-65%  #August 2019-double vision; secondary to refractive error-prism improved. 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  In the interim patient was diagnosed with shingles of her left lower extremity.  Status post treatment with acyclovir.  Patient developed sharp shooting pain from the hips down to the knee.  Patient is status post evaluation with orthopedics.  Currently on gabapentin.  Notes to have significant improvement of the pain.  Otherwise no nausea no vomiting.  Chronic fatigue.  Otherwise no chest pain.  No headaches.  Review of Systems  Constitutional: Positive for malaise/fatigue. Negative for chills, diaphoresis, fever and weight loss.  HENT: Negative for nosebleeds and sore throat.   Eyes: Negative for double vision.  Respiratory: Negative for cough, hemoptysis, sputum production and wheezing.   Cardiovascular: Negative for chest pain, orthopnea and leg swelling.  Gastrointestinal: Negative for abdominal pain, blood in stool, constipation, diarrhea, heartburn, melena, nausea and vomiting.  Genitourinary: Negative for dysuria, frequency and urgency.  Musculoskeletal: Negative for back pain and joint pain.  Skin: Negative.  Negative for itching and rash.   Neurological: Positive for dizziness. Negative for tingling, focal weakness and weakness.  Endo/Heme/Allergies: Does not bruise/bleed easily.  Psychiatric/Behavioral: Positive for memory loss. Negative  for depression. The patient does not have insomnia.       PAST MEDICAL HISTORY :  Past Medical History:  Diagnosis Date  . Arthritis   . Cancer ([place])   . Diverticulosis 07/06/15  . Dysrhythmia    tachycardia  . Esophagitis 07/06/15  . Fatigue   . Gastritis 07/06/15  . GERD (gastroesophageal reflux disease)   . 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 Leg cramps   . Lymphoma ([place]) 09/14/15   Monoclonal B cell lymphoma  . Night sweats   . Osteoporosis   . Overweight(278.02)    Obesity  . Palpitations   . Shortness of breath dyspnea   . Tachycardia   . Thrombocytopenia ([place])     PAST SURGICAL HISTORY :   Past Surgical History:  Procedure Laterality Date  . ABDOMINAL HYSTERECTOMY  2005  . BONE MARROW BIOPSY  09/14/15  . CHOLECYSTECTOMY  1997  . COLONOSCOPY  07/05/2014  . ESOPHAGOGASTRODUODENOSCOPY  07/05/2014  . INGUINAL LYMPH NODE BIOPSY Right 10/05/2015   Procedure: INGUINAL LYMPH NODE BIOPSY;  Surgeon: Christene Lye, MD;  Location: [facility] ORS;  Service: General;  Laterality: Right;  . PERIPHERAL VASCULAR CATHETERIZATION N/A 09/27/2015   Procedure: Glori Luis Cath Insertion;  Surgeon: Algernon Huxley, MD;  Location: [place] CV LAB;  Service: Cardiovascular;  Laterality: N/A;  . PORTA CATH REMOVAL Right 06/03/2017   Procedure: PORTA CATH REMOVAL, with venous thrombectomy;  Surgeon: Algernon Huxley, MD;  Location: [place] CV LAB;  Service: Cardiovascular;  Laterality: Right;  . PORTACATH PLACEMENT Right     FAMILY HISTORY :   Family History  Problem Relation Age of Onset  . Heart disease Father        CABG x 4  . Multiple myeloma Father   . Hypertension Mother   . Pancreatic cancer Mother   . Ulcers Mother   . Asthma Mother    . Brain cancer Maternal Uncle   . Multiple myeloma Maternal Uncle   . Diabetes Neg Hx   . Breast cancer Neg Hx     SOCIAL HISTORY:   Social History   Tobacco Use  . Smoking status: Never Smoker  . Smokeless tobacco: Never Used  Vaping Use  . Vaping Use: Never used  Substance Use Topics  . Alcohol use: No    Alcohol/week: 0.0 standard drinks  . Drug use: No    ALLERGIES:  is allergic to ondansetron hcl, oxycodone, and rituximab.  MEDICATIONS:  Current Outpatient Medications  Medication Sig Dispense Refill  . acetaminophen (TYLENOL) 650 MG CR tablet Take 650 mg by mouth every 8 (eight) hours as needed for pain.    Marland Kitchen ALPRAZolam (XANAX) 0.25 MG tablet TAKE ONE TABLET AT BEDTIME AS NEEDED FORANXIETY 20 tablet 2  . gabapentin (NEURONTIN) 300 MG capsule Take 1 capsule (300 mg total) by mouth 2 (two) times daily. 60 capsule 3  . meloxicam (MOBIC) 15 MG tablet Take 15 mg by mouth daily.    . midodrine (PROAMATINE) 5 MG tablet Take 1.5 tablets by mouth 3 (three) times daily.     . ondansetron (ZOFRAN-ODT) 4 MG disintegrating tablet Take 1 tablet (4 mg total) by mouth every 8 (eight) hours as needed for nausea or vomiting. (Patient not taking: Reported on 08/23/2020) 60 tablet 2   No current facility-administered medications for this visit.   Facility-Administered Medications Ordered in Other Visits  Medication Dose Route Frequency Provider Last Rate Last Admin  .  0.9 %  sodium chloride infusion   Intravenous Continuous Evlyn Kanner, NP   Stopped at 10/14/15 1312  . methotrexate (50 mg/ml) 6.3 g in sodium chloride 0.9 % 1,000 mL injection   Intravenous Once Charlaine Dalton R, MD      . sodium chloride flush (NS) 0.9 % injection 10 mL  10 mL Intravenous PRN Cammie Sickle, MD   10 mL at 10/05/16 1400  . sodium chloride flush (NS) 0.9 % injection 10 mL  10 mL Intravenous PRN Cammie Sickle, MD   10 mL at 08/23/20 1033  . sodium chloride flush (NS) 0.9 % injection 10  mL  10 mL Intravenous PRN Charlaine Dalton R, MD   10 mL at 08/23/20 1034    PHYSICAL EXAMINATION: ECOG PERFORMANCE STATUS: 0 - Asymptomatic  BP 120/80   Pulse 72   Temp (!) 96.9 F (36.1 C) (Tympanic)   Resp 20   Ht 5' 2"  (1.575 m)   Wt 177 lb (80.3 kg)   BMI 32.37 kg/m   Filed Weights   08/23/20 1035  Weight: 177 lb (80.3 kg)   Physical Exam Constitutional:      Comments: Ambulating independently.  Accompanied by husband.    HENT:     Head: Normocephalic and atraumatic.     Mouth/Throat:     Pharynx: No oropharyngeal exudate.  Eyes:     Pupils: Pupils are equal, round, and reactive to light.  Cardiovascular:     Rate and Rhythm: Normal rate and regular rhythm.  Pulmonary:     Effort: Pulmonary effort is normal. No respiratory distress.     Breath sounds: Normal breath sounds. No wheezing.  Abdominal:     General: Bowel sounds are normal. There is no distension.     Palpations: Abdomen is soft. There is no mass.     Tenderness: There is no abdominal tenderness. There is no guarding or rebound.  Musculoskeletal:        General: No tenderness. Normal range of motion.     Cervical back: Normal range of motion and neck supple.  Skin:    General: Skin is warm.  Neurological:     Mental Status: She is alert and oriented to person, place, and time.  Psychiatric:        Mood and Affect: Affect normal.     LABORATORY DATA:  I have reviewed the data as listed    Component Value Date/Time   NA 140 08/23/2020 1030   NA 138 08/12/2019 0000   K 3.9 08/23/2020 1030   CL 105 08/23/2020 1030   CO2 26 08/23/2020 1030   GLUCOSE 110 (H) 08/23/2020 1030   BUN 21 08/23/2020 1030   BUN 8 08/12/2019 0000   CREATININE 1.13 (H) 08/23/2020 1030   CALCIUM 9.2 08/23/2020 1030   PROT 6.2 (L) 08/23/2020 1030   ALBUMIN 4.5 08/23/2020 1030   AST 20 08/23/2020 1030   ALT 16 08/23/2020 1030   ALKPHOS 72 08/23/2020 1030   BILITOT 0.6 08/23/2020 1030   GFRNONAA 54 (L) 08/23/2020  1030   GFRAA >60 11/24/2019 1442    No results found for: SPEP, UPEP  Lab Results  Component Value Date   WBC 4.9 08/23/2020   NEUTROABS 3.7 08/23/2020   HGB 11.8 (L) 08/23/2020   HCT 34.2 (L) 08/23/2020   MCV 92.2 08/23/2020   PLT 129 (L) 08/23/2020      Chemistry      Component Value Date/Time  NA 140 08/23/2020 1030   NA 138 08/12/2019 0000   K 3.9 08/23/2020 1030   CL 105 08/23/2020 1030   CO2 26 08/23/2020 1030   BUN 21 08/23/2020 1030   BUN 8 08/12/2019 0000   CREATININE 1.13 (H) 08/23/2020 1030   GLU 88 08/12/2019 0000      Component Value Date/Time   CALCIUM 9.2 08/23/2020 1030   ALKPHOS 72 08/23/2020 1030   AST 20 08/23/2020 1030   ALT 16 08/23/2020 1030   BILITOT 0.6 08/23/2020 1030       RADIOGRAPHIC STUDIES: I have personally reviewed the radiological images as listed and agreed with the findings in the report. No results found.   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 S/p WBRT; April 8th, 2022-brain MRI-no evidence of any recurrent disease; signal changes consistent with treatment changes.  No abnormal enhancement concerning for recurrent disease or leptomeningeal involvement.  #Left lower extremity pain-shooting/likely secondary to postherpetic neuralgia-continue gabapentin 300 mg twice a day; continue meloxicam 1 pill every other day for knee pain.  Leg cramping-question neuropathy versus others.  Electrolytes normal.  Monitor for now  #Gait instability/orthostatic hypotension-s/p physical therapy; continue midodrine-stable.  # Neurocognitive issues/lack of motivation/extreme fatigue-multifactorial [malignancy; chemotherapy radiation] stable  # DISPOSITION: #Follow up in 2 months MD; labs- cbc/cmp/ldh; port flush [double lumen];- Dr.B  # I reviewed the blood work- with the patient in detail; also reviewed the imaging  independently [as summarized above]; and with the patient in detail.        No orders of the defined types were placed in this encounter.  All questions were answered. The patient knows to call the clinic with any problems, questions or concerns.      Cammie Sickle, MD 08/23/2020 1:00 PM

## 2020-08-23 NOTE — Assessment & Plan Note (Addendum)
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 S/p WBRT; April 8th, 2022-brain MRI-no evidence of any recurrent disease; signal changes consistent with treatment changes.  No abnormal enhancement concerning for recurrent disease or leptomeningeal involvement.  #Left lower extremity pain-shooting/likely secondary to postherpetic neuralgia-continue gabapentin 300 mg twice a day; continue meloxicam 1 pill every other day for knee pain.  Leg cramping-question neuropathy versus others.  Electrolytes normal.  Monitor for now  #Gait instability/orthostatic hypotension-s/p physical therapy; continue midodrine-stable.  # Neurocognitive issues/lack of motivation/extreme fatigue-multifactorial [malignancy; chemotherapy radiation] stable  # DISPOSITION: #Follow up in 2 months MD; labs- cbc/cmp/ldh; port flush [double lumen];- Dr.B  # I reviewed the blood work- with the patient in detail; also reviewed the imaging independently [as summarized above]; and with the patient in detail.

## 2020-08-23 NOTE — Progress Notes (Signed)
Husband would like to discuss the neuropathic pain in left knee/leg. Husband would like Dr. B to manage the gabapentin dosing.

## 2020-09-02 ENCOUNTER — Other Ambulatory Visit: Payer: Self-pay | Admitting: Internal Medicine

## 2020-10-18 ENCOUNTER — Emergency Department
Admission: EM | Admit: 2020-10-18 | Discharge: 2020-10-18 | Disposition: A | Discharge status: Home / Self Care | Payer: Medicare Other | Attending: Emergency Medicine | Admitting: Emergency Medicine

## 2020-10-18 ENCOUNTER — Other Ambulatory Visit: Payer: Self-pay

## 2020-10-18 ENCOUNTER — Encounter: Payer: Self-pay | Admitting: Emergency Medicine

## 2020-10-18 DIAGNOSIS — R531 Weakness: Secondary | ICD-10-CM | POA: Diagnosis present

## 2020-10-18 DIAGNOSIS — Z853 Personal history of malignant neoplasm of breast: Secondary | ICD-10-CM | POA: Diagnosis not present

## 2020-10-18 DIAGNOSIS — N390 Urinary tract infection, site not specified: Secondary | ICD-10-CM | POA: Diagnosis not present

## 2020-10-18 DIAGNOSIS — T50Z95A Adverse effect of other vaccines and biological substances, initial encounter: Secondary | ICD-10-CM

## 2020-10-18 DIAGNOSIS — Z20822 Contact with and (suspected) exposure to covid-19: Secondary | ICD-10-CM | POA: Diagnosis not present

## 2020-10-18 LAB — URINALYSIS, COMPLETE (UACMP) WITH MICROSCOPIC
Bilirubin Urine: NEGATIVE
Glucose, UA: NEGATIVE mg/dL
Ketones, ur: NEGATIVE mg/dL
Nitrite: NEGATIVE
Protein, ur: NEGATIVE mg/dL
Specific Gravity, Urine: 1.009 (ref 1.005–1.030)
WBC, UA: >50 WBC/hpf — ABNORMAL HIGH (ref 0–5)
pH: 6 (ref 5.0–8.0)

## 2020-10-18 LAB — BASIC METABOLIC PANEL
Anion gap: 7 (ref 5–15)
BUN: 15 mg/dL (ref 8–23)
CO2: 26 mmol/L (ref 22–32)
Calcium: 9.2 mg/dL (ref 8.9–10.3)
Chloride: 103 mmol/L (ref 98–111)
Creatinine, Ser: 1.05 mg/dL — ABNORMAL HIGH (ref 0.44–1.00)
GFR, Estimated: 59 mL/min — ABNORMAL LOW (ref >60)
Glucose, Bld: 112 mg/dL — ABNORMAL HIGH (ref 70–99)
Potassium: 3.7 mmol/L (ref 3.5–5.1)
Sodium: 136 mmol/L (ref 135–145)

## 2020-10-18 LAB — CBC
HCT: 35.2 % — ABNORMAL LOW (ref 36.0–46.0)
Hemoglobin: 12.3 g/dL (ref 12.0–15.0)
MCH: 32.6 pg (ref 26.0–34.0)
MCHC: 34.9 g/dL (ref 30.0–36.0)
MCV: 93.4 fL (ref 80.0–100.0)
Platelets: 104 10*3/uL — ABNORMAL LOW (ref 150–400)
RBC: 3.77 MIL/uL — ABNORMAL LOW (ref 3.87–5.11)
RDW: 12.4 % (ref 11.5–15.5)
WBC: 4.8 10*3/uL (ref 4.0–10.5)
nRBC: 0 % (ref 0.0–0.2)

## 2020-10-18 LAB — RESP PANEL BY RT-PCR (FLU A&B, COVID) ARPGX2
Influenza A by PCR: NEGATIVE
Influenza B by PCR: NEGATIVE
SARS Coronavirus 2 by RT PCR: NEGATIVE

## 2020-10-18 MED ORDER — KETOROLAC TROMETHAMINE 30 MG/ML IJ SOLN
30.0000 mg | Freq: Once | INTRAMUSCULAR | Status: AC
  Administered 2020-10-18: 30 mg via INTRAVENOUS
  Filled 2020-10-18: qty 1

## 2020-10-18 MED ORDER — METOCLOPRAMIDE HCL 5 MG/ML IJ SOLN
10.0000 mg | Freq: Once | INTRAMUSCULAR | Status: AC
  Administered 2020-10-18: 10 mg via INTRAVENOUS
  Filled 2020-10-18: qty 2

## 2020-10-18 MED ORDER — SODIUM CHLORIDE 0.9 % IV SOLN
1.0000 g | Freq: Once | INTRAVENOUS | Status: AC
  Administered 2020-10-18: 1 g via INTRAVENOUS
  Filled 2020-10-18: qty 10

## 2020-10-18 MED ORDER — CEPHALEXIN 500 MG PO CAPS: 500.0000 mg | ORAL_CAPSULE | Freq: Three times a day (TID) | ORAL | 0 refills | Status: DC

## 2020-10-18 MED ORDER — SODIUM CHLORIDE 0.9 % IV BOLUS
1000.0000 mL | Freq: Once | INTRAVENOUS | Status: AC
  Administered 2020-10-18: 1000 mL via INTRAVENOUS

## 2020-10-18 NOTE — ED Notes (Signed)
Pt ambulatory to bedside commode with RN assistance.

## 2020-10-18 NOTE — ED Triage Notes (Signed)
Patient states she was fine today until she was walking from the den to the kitchen when she ? Had a syncopal event.  Patient states she "went down" but cannot recall what happened.  AAOx3.  Skin warm and dry. NAD

## 2020-10-18 NOTE — ED Notes (Signed)
Urine culture add on via lab.  

## 2020-10-18 NOTE — ED Notes (Signed)
ED Provider at bedside. 

## 2020-10-18 NOTE — ED Triage Notes (Signed)
Pt comes into the ED [facility] from home with c/o fell earlier today refused EMS, called back out for worsening weakness and HA, had covid booster yesterday..   Temp 101.1 CBG116 79HR 99%RA 14RR 136/69

## 2020-10-18 NOTE — ED Provider Notes (Signed)
[facility] Emergency Department Provider Note  Time seen: 8:04 PM  I have reviewed the triage vital signs and the nursing notes.   HISTORY  Chief Complaint Loss of Consciousness   HPI Stacy Murillo is a 65 y.o. female with a past medical history of arthritis, gastric reflux, presents to the emergency department for a fall and generalized weakness.  According to the patient she had her second booster shot yesterday.  Today around lunchtime she states she felt very weak and fell to the ground.  Patient denies injuring herself states she more slumped down to the ground but denies LOC.  Patient found to be febrile by EMS to 101, currently 99.4 in the emergency department.  Patient denies any chest pain at any point denies abdominal pain states nausea but denies any vomiting or diarrhea.  Past Medical History:  Diagnosis Date  . Arthritis   . Cancer ([place])   . Diverticulosis 07/06/15  . Dysrhythmia    tachycardia  . Esophagitis 07/06/15  . Fatigue   . Gastritis 07/06/15  . GERD (gastroesophageal reflux disease)   . 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 Leg cramps   . Lymphoma ([place]) 09/14/15   Monoclonal B cell lymphoma  . Night sweats   . Osteoporosis   . Overweight(278.02)    Obesity  . Palpitations   . Shortness of breath dyspnea   . Tachycardia   . Thrombocytopenia [facility])     Patient Active Problem List   Diagnosis Date Noted  . Mild cognitive impairment with memory loss 04/21/2020  . 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) 03/19/2019  . 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 Goals of care, counseling/discussion 03/08/2019  . Intractable nausea and vomiting 02/19/2019  . Lesion of frontal lobe of brain 02/19/2019  . Weight gain, abnormal 12/23/2018  . Generalized anxiety disorder 02/09/2018  . History of autologous stem cell transplant ([place]) 01/15/2018  . Immunization due 01/15/2018  . Vaginal  dryness, menopausal 12/18/2017  . Stem cell transplant candidate 05/30/2017  . Diverticulitis large intestine w/o perforation or abscess w/bleeding 02/17/2017  . DLBCL (diffuse large B cell lymphoma) ([place]) 05/25/2016  . Elevated LFTs 10/27/2015  . DVT (deep venous thrombosis) ([place]) 10/27/2015  . Generalized headaches 10/27/2015  . Oral mucositis due to antineoplastic therapy 10/14/2015  . 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[place]) 09/21/2015  . Palpitation 09/14/2015  . SOB (shortness of breath) on exertion 09/14/2015  . Left knee pain 07/20/2014  . Achilles tendonitis 07/20/2014  . Esophageal reflux 04/06/2014  . Special screening for malignant neoplasms, colon 04/06/2014  . Screening for breast cancer 07/10/2011  . Osteopenia 07/06/2011  . Vitamin D deficiency 07/06/2011  . Hyperlipidemia 07/06/2011  . Disorder of bone and cartilage 07/06/2011  . Osteoporosis   . Overweight 10/19/2008    Past Surgical History:  Procedure Laterality Date  . ABDOMINAL HYSTERECTOMY  2005  . BONE MARROW BIOPSY  09/14/15  . CHOLECYSTECTOMY  1997  . COLONOSCOPY  07/05/2014  . ESOPHAGOGASTRODUODENOSCOPY  07/05/2014  . INGUINAL LYMPH NODE BIOPSY Right 10/05/2015   Procedure: INGUINAL LYMPH NODE BIOPSY;  Surgeon: Christene Lye, MD;  Location: [facility] ORS;  Service: General;  Laterality: Right;  . PERIPHERAL VASCULAR CATHETERIZATION N/A 09/27/2015   Procedure: Glori Luis Cath Insertion;  Surgeon: Algernon Huxley, MD;  Location: [place] CV LAB;  Service: Cardiovascular;  Laterality: N/A;  . PORTA CATH REMOVAL Right 06/03/2017  Procedure: PORTA CATH REMOVAL, with venous thrombectomy;  Surgeon: Algernon Huxley, MD;  Location: [place] CV LAB;  Service: Cardiovascular;  Laterality: Right;  . PORTACATH PLACEMENT Right     Prior to Admission medications   Medication Sig Start Date End Date Taking? Authorizing Provider  acetaminophen (TYLENOL) 650 MG CR tablet Take 650 mg by mouth every 8  (eight) hours as needed for pain.    [provider]  ALPRAZolam Duanne Moron) 0.25 MG tablet TAKE ONE TABLET AT BEDTIME AS NEEDED FORANXIETY 07/22/20   Crecencio Mc, MD  gabapentin (NEURONTIN) 300 MG capsule TAKE 1 CAPUSLE BY MOUTH AT NIGHT. THE NEXT DAY TAKE 1 CAPSULE DURING THE DAY AND THEN AT NIGHT. CONTINUE ON 2 CAPSULES DAILY 09/02/20   Crecencio Mc, MD  meloxicam (MOBIC) 15 MG tablet Take 15 mg by mouth daily. 07/14/20   [provider]  midodrine (PROAMATINE) 5 MG tablet Take 1.5 tablets by mouth 3 (three) times daily.  03/04/19   [provider]  ondansetron (ZOFRAN-ODT) 4 MG disintegrating tablet Take 1 tablet (4 mg total) by mouth every 8 (eight) hours as needed for nausea or vomiting. Patient not taking: Reported on 08/23/2020 07/21/20   Verlon Au, NP    Allergies  Allergen Reactions  . Ondansetron Hcl Other (See Comments)    Severe constipation   . Oxycodone     Biliary colic with opioids s/p cholecystectomy    . Rituximab Other (See Comments)    Pt reports throat tightness and facial swelling and redness during infusion of Rituxan    Family History  Problem Relation Age of Onset  . Heart disease Father        CABG x 4  . Multiple myeloma Father   . Hypertension Mother   . Pancreatic cancer Mother   . Ulcers Mother   . Asthma Mother   . Brain cancer Maternal Uncle   . Multiple myeloma Maternal Uncle   . Diabetes Neg Hx   . Breast cancer Neg Hx     Social History Social History   Tobacco Use  . Smoking status: Never Smoker  . Smokeless tobacco: Never Used  Vaping Use  . Vaping Use: Never used  Substance Use Topics  . Alcohol use: No    Alcohol/week: 0.0 standard drinks  . Drug use: No    Review of Systems Constitutional: Found to have a fever by EMS, low-grade in the emergency department.  Generalized weakness today. Cardiovascular: Negative for chest pain. Respiratory: Negative for shortness of breath. Gastrointestinal:  Negative for abdominal pain Musculoskeletal: Negative for musculoskeletal complaints Neurological: Negative for headache All other ROS negative  ____________________________________________   PHYSICAL EXAM:  VITAL SIGNS: ED Triage Vitals  Enc Vitals Group     BP 10/18/20 1858 (!) 172/90     Pulse Rate 10/18/20 1858 91     Resp 10/18/20 1858 16     Temp 10/18/20 1858 99.4 F (37.4 C)     Temp Source 10/18/20 1858 Oral     SpO2 10/18/20 1858 100 %     Weight 10/18/20 1856 177 lb 0.5 oz (80.3 kg)     Height 10/18/20 1856 5' 3"  (1.6 m)     Head Circumference --      Peak Flow --      Pain Score 10/18/20 1855 0     Pain Loc --      Pain Edu? --      Excl. in [place]? --  Constitutional: Alert and oriented. Well appearing and in no distress. Eyes: Normal exam ENT      Head: Normocephalic and atraumatic.      Mouth/Throat: Mucous membranes are moist. Cardiovascular: Normal rate, regular rhythm.  Respiratory: Normal respiratory effort without tachypnea nor retractions. Breath sounds are clear Gastrointestinal: Soft and nontender. No distention. Musculoskeletal: Nontender with normal range of motion in all extremities.  Neurologic:  Normal speech and language. No gross focal neurologic deficits  Skin:  Skin is warm, dry and intact.  Psychiatric: Mood and affect are normal.   ____________________________________________    EKG  EKG viewed and interpreted by myself shows a normal sinus rhythm 86 bpm with a narrow QRS, normal axis, normal intervals, no concerning ST changes.  ____________________________________________   INITIAL IMPRESSION / ASSESSMENT AND PLAN / ED COURSE  Pertinent labs & imaging results that were available during my care of the patient were reviewed by me and considered in my medical decision making (see chart for details).   Patient presents emergency department for weakness and a fall today due to weakness.  Patient had a booster shot yesterday, found  to have a fever by EMS, low-grade in the emergency department.  Patient denies any cough shortness of breath vomiting or diarrhea does state nausea.  Suspect vaccine related fever however we will check labs, urinalysis and a COVID swab as a precaution.  We will dose IV fluids nausea medication and Toradol and reassess.  Patient agreeable to plan of care.  Patient's COVID is negative.  Urinalysis appears consistent with urinary tract infection.  We will dose antibiotics and discharge with the same.  Fever could either be related to urinary tract infection versus vaccine.  Discussed with the patient supportive care at home including plenty of fluids and rest and continued antibiotics for the next 7 days.  Patient agreeable to plan of care.  Zacari Radick [name] was evaluated in Emergency Department on 10/18/2020 for the symptoms described in the history of present illness. She was evaluated in the context of the global COVID-19 pandemic, which necessitated consideration that the patient might be at risk for infection with the SARS-CoV-2 virus that causes COVID-19. Institutional protocols and algorithms that pertain to the evaluation of patients at risk for COVID-19 are in a state of rapid change based on information released by regulatory bodies including the CDC and federal and state organizations. These policies and algorithms were followed during the patient's care in the ED.  ____________________________________________   FINAL CLINICAL IMPRESSION(S) / ED DIAGNOSES  Fever Weakness Urinary tract infection   Harvest Dark, MD 10/18/20 2259

## 2020-10-18 NOTE — Discharge Instructions (Addendum)
Take antibiotic as prescribed. Return to the ER for worsening symptoms, persistent vomiting, fever or other concerns. 

## 2020-10-21 LAB — URINE CULTURE: Culture: >=100000 — AB

## 2020-10-24 ENCOUNTER — Other Ambulatory Visit: Payer: Self-pay

## 2020-10-24 DIAGNOSIS — C8599 Non-Hodgkin lymphoma, unspecified, extranodal and solid organ sites: Secondary | ICD-10-CM

## 2020-10-24 DIAGNOSIS — C8583 Other specified types of non-Hodgkin lymphoma, intra-abdominal lymph nodes: Secondary | ICD-10-CM

## 2020-10-25 ENCOUNTER — Inpatient Hospital Stay: Payer: Medicare Other | Attending: Internal Medicine

## 2020-10-25 ENCOUNTER — Other Ambulatory Visit: Payer: Self-pay

## 2020-10-25 ENCOUNTER — Encounter: Payer: Self-pay | Admitting: Internal Medicine

## 2020-10-25 ENCOUNTER — Inpatient Hospital Stay (HOSPITAL_BASED_OUTPATIENT_CLINIC_OR_DEPARTMENT_OTHER): Payer: Medicare Other | Admitting: Internal Medicine

## 2020-10-25 VITALS — BP 120/74 | HR 74 | Temp 98.0°F | Resp 16 | Ht 63.0 in | Wt 183.0 lb

## 2020-10-25 DIAGNOSIS — M25562 Pain in left knee: Secondary | ICD-10-CM | POA: Diagnosis present

## 2020-10-25 DIAGNOSIS — C8583 Other specified types of non-Hodgkin lymphoma, intra-abdominal lymph nodes: Secondary | ICD-10-CM

## 2020-10-25 DIAGNOSIS — I951 Orthostatic hypotension: Secondary | ICD-10-CM | POA: Insufficient documentation

## 2020-10-25 DIAGNOSIS — Z8572 Personal history of non-Hodgkin lymphomas: Secondary | ICD-10-CM | POA: Diagnosis present

## 2020-10-25 DIAGNOSIS — Z95828 Presence of other vascular implants and grafts: Secondary | ICD-10-CM

## 2020-10-25 DIAGNOSIS — C8599 Non-Hodgkin lymphoma, unspecified, extranodal and solid organ sites: Secondary | ICD-10-CM

## 2020-10-25 DIAGNOSIS — Z79899 Other long term (current) drug therapy: Secondary | ICD-10-CM | POA: Insufficient documentation

## 2020-10-25 LAB — COMPREHENSIVE METABOLIC PANEL
ALT: 33 U/L (ref 0–44)
AST: 24 U/L (ref 15–41)
Albumin: 4.4 g/dL (ref 3.5–5.0)
Alkaline Phosphatase: 85 U/L (ref 38–126)
Anion gap: 9 (ref 5–15)
BUN: 13 mg/dL (ref 8–23)
CO2: 27 mmol/L (ref 22–32)
Calcium: 9.4 mg/dL (ref 8.9–10.3)
Chloride: 103 mmol/L (ref 98–111)
Creatinine, Ser: 1.04 mg/dL — ABNORMAL HIGH (ref 0.44–1.00)
GFR, Estimated: 59 mL/min — ABNORMAL LOW (ref >60)
Glucose, Bld: 140 mg/dL — ABNORMAL HIGH (ref 70–99)
Potassium: 4 mmol/L (ref 3.5–5.1)
Sodium: 139 mmol/L (ref 135–145)
Total Bilirubin: 0.6 mg/dL (ref 0.3–1.2)
Total Protein: 6.5 g/dL (ref 6.5–8.1)

## 2020-10-25 LAB — CBC WITH DIFFERENTIAL/PLATELET
Abs Immature Granulocytes: 0.02 10*3/uL (ref 0.00–0.07)
Basophils Absolute: 0.1 10*3/uL (ref 0.0–0.1)
Basophils Relative: 1 %
Eosinophils Absolute: 0.1 10*3/uL (ref 0.0–0.5)
Eosinophils Relative: 2 %
HCT: 34.8 % — ABNORMAL LOW (ref 36.0–46.0)
Hemoglobin: 12.3 g/dL (ref 12.0–15.0)
Immature Granulocytes: 0 %
Lymphocytes Relative: 14 %
Lymphs Abs: 0.8 10*3/uL (ref 0.7–4.0)
MCH: 32.7 pg (ref 26.0–34.0)
MCHC: 35.3 g/dL (ref 30.0–36.0)
MCV: 92.6 fL (ref 80.0–100.0)
Monocytes Absolute: 0.5 10*3/uL (ref 0.1–1.0)
Monocytes Relative: 8 %
Neutro Abs: 4.4 10*3/uL (ref 1.7–7.7)
Neutrophils Relative %: 75 %
Platelets: 143 10*3/uL — ABNORMAL LOW (ref 150–400)
RBC: 3.76 MIL/uL — ABNORMAL LOW (ref 3.87–5.11)
RDW: 11.9 % (ref 11.5–15.5)
WBC: 5.9 10*3/uL (ref 4.0–10.5)
nRBC: 0 % (ref 0.0–0.2)

## 2020-10-25 LAB — LACTATE DEHYDROGENASE: LDH: 163 U/L (ref 98–192)

## 2020-10-25 MED ORDER — HEPARIN SOD (PORK) LOCK FLUSH 100 UNIT/ML IV SOLN
500.0000 [IU] | Freq: Once | INTRAVENOUS | Status: AC
Start: 2020-10-25 — End: 2020-10-25
  Administered 2020-10-25: 500 [IU] via INTRAVENOUS
  Filled 2020-10-25: qty 5

## 2020-10-25 MED ORDER — SODIUM CHLORIDE 0.9% FLUSH
10.0000 mL | Freq: Once | INTRAVENOUS | Status: AC
  Administered 2020-10-25: 10 mL via INTRAVENOUS
  Filled 2020-10-25: qty 10

## 2020-10-25 NOTE — Progress Notes (Signed)
Wants to talk about backing off of gabapentin. She was on it for nerve pain from shingles. Was given by Dr. Derrel Nip. Nerve pain has improved but wanted your input.

## 2020-10-25 NOTE — Assessment & Plan Note (Addendum)
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 S/p WBRT; April 8th, 2022-brain MRI-no evidence of any recurrent disease; signal changes consistent with treatment changes.  No abnormal enhancement concerning for recurrent disease or leptomeningeal involvement.  No clinical evidence of progression.  Stable.  #Left lower extremity pain-shooting/likely secondary to postherpetic neuralgia- Improved-Taper-continue gabapentin 300 mg over 2 weeks or so.   #Gait instability/orthostatic hypotension-s/p physical therapy; continue midodrine-STABLE.   # Insomina-recommend melatonin  # Neurocognitive issues/lack of motivation/extreme fatigue-multifactorial [malignancy; chemotherapy radiation] -stable   # DISPOSITION: #Follow up in 3 months MD; labs- cbc/cmp/ldh; port flush [double lumen];MRI brain- Dr.B

## 2020-10-25 NOTE — Progress Notes (Signed)
[facility] OFFICE PROGRESS NOTE  Patient Care Team: Crecencio Mc, MD as PCP - General (Internal Medicine) Cammie Sickle, MD as Consulting Physician (Internal Medicine) Christene Lye, MD (General Surgery) [name], MD as Consulting Physician (Cardiology)  Cancer Staging Large cell lymphoma of intra-abdominal lymph nodes [facility]) Staging form: Lymphoid Neoplasms, AJCC 6th Edition - Clinical stage from 09/13/2015: Stage IV - Signed by Cammie Sickle, MD on 10/06/2015 Specimen type: Core Needle Biopsy    Oncology History Overview Note  #MAY 2017- 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 CT- 1-2 CM LN subpectoral/medistinal/ retro-peritoneal/pelvic/ Right inguinal LN 1.6cm- Bx- DLBCL- ABC; myc-POS; FISH gene re-arragement-NEG. PET- MULTIPLE LN/ Bone involvement; R-CHOP x6- CR'# OCT 2017- PET scan- CR; DEC 22nd 2017- BMBx- "atypical large B cells- <1%  [[facility]; II opinion- Dr.Grover- review of path- not concerning for residual lymphoma]; recommend surviellance  # March 26th  2018PET- NED  # Lumbar puncture- difficult-spinal headache. S/p high dose MXT x4.  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   # OCT 2020- MASS  spanning the corpus callosum. Brain Biopsy  at [facility]-DLBCL. CD20+. EBV -, Ki67+, Pax5+. Port placed 10/27, R-MPV started 10/27 [facility]; Dr.Grover]. s/p WBRT ; MARCH 31st- MRI- remission/Stable disease   ---------------------------------------------------------- # June 2017-Elevated LFTs- valacylovir/diflucan; resolved.   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   # May 2017- EF- 55-65%  #August 2019-double vision; secondary to refractive error-prism improved. 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  Patient interim had episode of UTI f which led to a fall and hit her chin.  S/p treatment with antibiotics.  Symptoms resolved.  She is back to baseline.   Patient's shingles/pain improved.-She is currently on gabapentin.  Interested in tapering the Neurontin.  Patient denies headaches.  Denies any worsening falls.   Review of Systems  Constitutional:  Positive for malaise/fatigue. Negative for chills, diaphoresis, fever and weight loss.  HENT:  Negative for nosebleeds and sore throat.   Eyes:  Negative for double vision.  Respiratory:  Negative for cough, hemoptysis, sputum production and wheezing.   Cardiovascular:  Negative for chest pain, orthopnea and leg swelling.  Gastrointestinal:  Negative for abdominal pain, blood in stool, constipation, diarrhea, heartburn, melena, nausea and vomiting.  Genitourinary:  Negative for dysuria, frequency and urgency.  Musculoskeletal:  Negative for back pain and joint pain.  Skin: Negative.   Negative for itching and rash.  Neurological:  Positive for dizziness. Negative for tingling, focal weakness and  weakness.  Endo/Heme/Allergies:  Does not bruise/bleed easily.  Psychiatric/Behavioral:  Positive for memory loss. Negative for depression. The patient does not have insomnia.      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   Leg cramps    Lymphoma ([place]) 09/14/15   Monoclonal B cell lymphoma   Night sweats    Osteoporosis    Overweight(278.02)    Obesity   Palpitations    Shortness of breath dyspnea    Tachycardia    Thrombocytopenia (HCC)     PAST SURGICAL HISTORY :   Past Surgical History:  Procedure Laterality Date   ABDOMINAL HYSTERECTOMY  2005   BONE MARROW BIOPSY  09/14/15   CHOLECYSTECTOMY  1997   COLONOSCOPY  07/05/2014   ESOPHAGOGASTRODUODENOSCOPY  07/05/2014   INGUINAL LYMPH NODE BIOPSY Right 10/05/2015   Procedure: INGUINAL LYMPH NODE BIOPSY;  Surgeon: Christene Lye, MD;  Location: [facility] OR[facility];  Service: General;  Laterality: Right;   PERIPHERAL VASCULAR CATHETERIZATION N/A 09/27/2015   Procedure: Glori Luis Cath Insertion;  Surgeon: Algernon Huxley, MD;  Location: [place] [facility];  Service: Cardiovascular;  Laterality: N/A;   PORTA CATH REMOVAL Right 06/03/2017   Procedure: PORTA CATH REMOVAL, with venous thrombectomy;  Surgeon: Algernon Huxley, MD;  Location: [place] [facility];  Service: Cardiovascular;  Laterality: Right;   PORTACATH PLACEMENT Right     FAMILY HISTORY :   Family History  Problem Relation Age of Onset   Heart disease Father        CABG x 4   Multiple myeloma Father    Hypertension Mother    Pancreatic cancer Mother    Ulcers Mother    Asthma Mother     Brain cancer Maternal Uncle    Multiple myeloma Maternal Uncle    Diabetes Neg Hx    Breast cancer Neg Hx     SOCIAL HISTORY:   Social History   Tobacco Use   Smoking status: Never   Smokeless tobacco: Never  Vaping Use   Vaping Use: Never used  Substance Use Topics   Alcohol use: No    Alcohol/week: 0.0 standard drinks   Drug use: No    ALLERGIES:  is allergic to ondansetron hcl, oxycodone, and rituximab.  MEDICATIONS:  Current Outpatient Medications  Medication Sig Dispense Refill   acetaminophen (TYLENOL) 650 MG CR tablet Take 650 mg by mouth every 8 (eight) hours as needed for pain.     cephALEXin (KEFLEX) 500 MG capsule Take 1 capsule (500 mg total) by mouth 3 (three) times daily. 21 capsule 0   gabapentin (NEURONTIN) 300 MG capsule TAKE 1 CAPUSLE BY MOUTH AT NIGHT. THE NEXT DAY TAKE 1 CAPSULE DURING THE DAY AND THEN AT NIGHT. CONTINUE ON 2 CAPSULES DAILY 60 capsule 3   meloxicam (MOBIC) 15 MG tablet Take 15 mg by mouth daily.     midodrine (PROAMATINE) 5 MG tablet Take 1.5 tablets by mouth 3 (three) times daily.      ondansetron (ZOFRAN-ODT) 4 MG disintegrating tablet Take 1 tablet (4 mg total) by mouth every 8 (eight) hours as needed for nausea or vomiting. 60 tablet 2   ALPRAZolam Duanne Moron)  0.25 MG tablet TAKE ONE TABLET AT BEDTIME AS NEEDED FORANXIETY (Patient not taking: Reported on 10/25/2020) 20 tablet 2   No current facility-administered medications for this visit.   Facility-Administered Medications Ordered in Other Visits  Medication Dose Route Frequency Provider Last Rate Last Admin   0.9 %  sodium chloride infusion   Intravenous Continuous Herring, Orville Govern, NP   Stopped at 10/14/15 1312   methotrexate (50 mg/ml) 6.3 g in sodium chloride 0.9 % 1,000 mL injection   Intravenous Once Charlaine Dalton R, MD       sodium chloride flush (NS) 0.9 % injection 10 mL  10 mL Intravenous PRN Cammie Sickle, MD   10 mL at 10/05/16 1400    PHYSICAL  EXAMINATION: ECOG PERFORMANCE STATUS: 0 - Asymptomatic  BP 120/74 (BP Location: Left Arm, Patient Position: Sitting, Cuff Size: Large)   Pulse 74   Temp 98 F (36.7 C) (Tympanic)   Resp 16   Ht _0  (1.6 m)   Wt 183 lb (83 kg)   SpO2 98%   BMI 32.42 kg/m   Filed Weights   10/25/20 1109  Weight: 183 lb (83 kg)   Physical Exam Constitutional:      Comments: Ambulating independently.  Accompanied by husband.    HENT:     Head: Normocephalic and atraumatic.     Mouth/Throat:     Pharynx: No oropharyngeal exudate.  Eyes:     Pupils: Pupils are equal, round, and reactive to light.  Cardiovascular:     Rate and Rhythm: Normal rate and regular rhythm.  Pulmonary:     Effort: Pulmonary effort is normal. No respiratory distress.     Breath sounds: Normal breath sounds. No wheezing.  Abdominal:     General: Bowel sounds are normal. There is no distension.     Palpations: Abdomen is soft. There is no mass.     Tenderness: no abdominal tenderness There is no guarding or rebound.  Musculoskeletal:        General: No tenderness. Normal range of motion.     Cervical back: Normal range of motion and neck supple.  Skin:    General: Skin is warm.  Neurological:     Mental Status: She is alert and oriented to person, place, and time.  Psychiatric:        Mood and Affect: Affect normal.    LABORATORY DATA:  I have reviewed the data as listed    Component Value Date/Time   NA 139 10/25/2020 1055   NA 138 08/12/2019 0000   K 4.0 10/25/2020 1055   CL 103 10/25/2020 1055   CO2 27 10/25/2020 1055   GLUCOSE 140 (H) 10/25/2020 1055   BUN 13 10/25/2020 1055   BUN 8 08/12/2019 0000   CREATININE 1.04 (H) 10/25/2020 1055   CALCIUM 9.4 10/25/2020 1055   PROT 6.5 10/25/2020 1055   ALBUMIN 4.4 10/25/2020 1055   AST 24 10/25/2020 1055   ALT 33 10/25/2020 1055   ALKPHOS 85 10/25/2020 1055   BILITOT 0.6 10/25/2020 1055   GFRNONAA 59 (L) 10/25/2020 1055   GFRAA >60 11/24/2019 1442     No results found for: SPEP, UPEP  Lab Results  Component Value Date   WBC 5.9 10/25/2020   NEUTROABS 4.4 10/25/2020   HGB 12.3 10/25/2020   HCT 34.8 (L) 10/25/2020   MCV 92.6 10/25/2020   PLT 143 (L) 10/25/2020      Chemistry      Component Value  Date/Time   NA 139 10/25/2020 1055   NA 138 08/12/2019 0000   K 4.0 10/25/2020 1055   CL 103 10/25/2020 1055   CO2 27 10/25/2020 1055   BUN 13 10/25/2020 1055   BUN 8 08/12/2019 0000   CREATININE 1.04 (H) 10/25/2020 1055   GLU 88 08/12/2019 0000      Component Value Date/Time   CALCIUM 9.4 10/25/2020 1055   ALKPHOS 85 10/25/2020 1055   AST 24 10/25/2020 1055   ALT 33 10/25/2020 1055   BILITOT 0.6 10/25/2020 1055       RADIOGRAPHIC STUDIES: I have personally reviewed the radiological images as listed and agreed with the findings in the report. No results found.   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 S/p WBRT; April 8th, 2022-brain MRI-no evidence of any recurrent disease; signal changes consistent with treatment changes.  No abnormal enhancement concerning for recurrent disease or leptomeningeal involvement.  #Left lower extremity pain-shooting/likely secondary to postherpetic neuralgia- Improved-Taper-continue gabapentin 300 mg over 2 weeks or so.  t   #Gait instability/orthostatic hypotension-s/p physical therapy; continue midodrine-stable.  # Insomina- reocmmend melatonin.   # Neurocognitive issues/lack of motivation/extreme fatigue-multifactorial [malignancy; chemotherapy radiation] -STABLE>   # UTI/ s-  Fall- E.coliBruise- chin s/p fall s/p covid boster  # DISPOSITION: #Follow up in 3 months MD; labs- cbc/cmp/ldh; port flush [double lumen];MRI brain- Dr.B      Orders Placed This Encounter  Procedures   MR Brain W Wo Contrast    Standing Status:   Future    Standing  Expiration Date:   10/25/2021    Order Specific Question:   If indicated for the ordered procedure, I authorize the administration of contrast media per Radiology protocol    Answer:   Yes    Order Specific Question:   What is the patient's sedation requirement?    Answer:   No Sedation    Order Specific Question:   Does the patient have a pacemaker or implanted devices?    Answer:   No    Order Specific Question:   Use SRS Protocol?    Answer:   No    Order Specific Question:   Preferred imaging location?    Answer:   [facility] (table limit - 550lbs)   CBC with Differential/Platelet    Standing Status:   Future    Standing Expiration Date:   10/25/2021   Comprehensive metabolic panel    Standing Status:   Future    Standing Expiration Date:   10/25/2021   Lactate dehydrogenase    Standing Status:   Future    Standing Expiration Date:   10/25/2021   All questions were answered. The patient knows to call the clinic with any problems, questions or concerns.      Cammie Sickle, MD 11/06/2020 9:02 PM

## 2021-01-25 ENCOUNTER — Ambulatory Visit
Admission: RE | Admit: 2021-01-25 | Discharge: 2021-01-25 | Disposition: A | Discharge status: Home / Self Care | Payer: Medicare Other | Source: Ambulatory Visit | Attending: Internal Medicine | Admitting: Internal Medicine

## 2021-01-25 ENCOUNTER — Other Ambulatory Visit: Payer: Self-pay

## 2021-01-25 DIAGNOSIS — C8599 Non-Hodgkin lymphoma, unspecified, extranodal and solid organ sites: Secondary | ICD-10-CM | POA: Insufficient documentation

## 2021-01-25 MED ORDER — GADOBUTROL 1 MMOL/ML IV SOLN
7.5000 mL | Freq: Once | INTRAVENOUS | Status: AC | PRN
  Administered 2021-01-25: 7.5 mL via INTRAVENOUS

## 2021-01-26 ENCOUNTER — Inpatient Hospital Stay: Payer: Medicare Other | Attending: Internal Medicine

## 2021-01-26 ENCOUNTER — Inpatient Hospital Stay (HOSPITAL_BASED_OUTPATIENT_CLINIC_OR_DEPARTMENT_OTHER): Payer: Medicare Other | Admitting: Internal Medicine

## 2021-01-26 ENCOUNTER — Encounter: Payer: Self-pay | Admitting: Internal Medicine

## 2021-01-26 DIAGNOSIS — Z923 Personal history of irradiation: Secondary | ICD-10-CM | POA: Insufficient documentation

## 2021-01-26 DIAGNOSIS — Z9221 Personal history of antineoplastic chemotherapy: Secondary | ICD-10-CM | POA: Insufficient documentation

## 2021-01-26 DIAGNOSIS — C8599 Non-Hodgkin lymphoma, unspecified, extranodal and solid organ sites: Secondary | ICD-10-CM

## 2021-01-26 DIAGNOSIS — Z8572 Personal history of non-Hodgkin lymphomas: Secondary | ICD-10-CM | POA: Diagnosis not present

## 2021-01-26 DIAGNOSIS — Z79899 Other long term (current) drug therapy: Secondary | ICD-10-CM | POA: Diagnosis not present

## 2021-01-26 LAB — CBC WITH DIFFERENTIAL/PLATELET
Abs Immature Granulocytes: 0.02 10*3/uL (ref 0.00–0.07)
Basophils Absolute: 0 10*3/uL (ref 0.0–0.1)
Basophils Relative: 1 %
Eosinophils Absolute: 0.1 10*3/uL (ref 0.0–0.5)
Eosinophils Relative: 2 %
HCT: 37.1 % (ref 36.0–46.0)
Hemoglobin: 12.7 g/dL (ref 12.0–15.0)
Immature Granulocytes: 0 %
Lymphocytes Relative: 12 %
Lymphs Abs: 0.7 10*3/uL (ref 0.7–4.0)
MCH: 31.2 pg (ref 26.0–34.0)
MCHC: 34.2 g/dL (ref 30.0–36.0)
MCV: 91.2 fL (ref 80.0–100.0)
Monocytes Absolute: 0.6 10*3/uL (ref 0.1–1.0)
Monocytes Relative: 10 %
Neutro Abs: 4.2 10*3/uL (ref 1.7–7.7)
Neutrophils Relative %: 75 %
Platelets: 133 10*3/uL — ABNORMAL LOW (ref 150–400)
RBC: 4.07 MIL/uL (ref 3.87–5.11)
RDW: 12.4 % (ref 11.5–15.5)
WBC: 5.7 10*3/uL (ref 4.0–10.5)
nRBC: 0 % (ref 0.0–0.2)

## 2021-01-26 LAB — COMPREHENSIVE METABOLIC PANEL
ALT: 12 U/L (ref 0–44)
AST: 16 U/L (ref 15–41)
Albumin: 4.4 g/dL (ref 3.5–5.0)
Alkaline Phosphatase: 72 U/L (ref 38–126)
Anion gap: 8 (ref 5–15)
BUN: 16 mg/dL (ref 8–23)
CO2: 28 mmol/L (ref 22–32)
Calcium: 9.3 mg/dL (ref 8.9–10.3)
Chloride: 103 mmol/L (ref 98–111)
Creatinine, Ser: 1.11 mg/dL — ABNORMAL HIGH (ref 0.44–1.00)
GFR, Estimated: 55 mL/min — ABNORMAL LOW (ref >60)
Glucose, Bld: 127 mg/dL — ABNORMAL HIGH (ref 70–99)
Potassium: 3.8 mmol/L (ref 3.5–5.1)
Sodium: 139 mmol/L (ref 135–145)
Total Bilirubin: 0.8 mg/dL (ref 0.3–1.2)
Total Protein: 6.6 g/dL (ref 6.5–8.1)

## 2021-01-26 LAB — LACTATE DEHYDROGENASE: LDH: 127 U/L (ref 98–192)

## 2021-01-26 MED ORDER — SODIUM CHLORIDE 0.9% FLUSH
10.0000 mL | Freq: Once | INTRAVENOUS | Status: AC
  Administered 2021-01-26: 10 mL via INTRAVENOUS
  Filled 2021-01-26: qty 10

## 2021-01-26 NOTE — Assessment & Plan Note (Addendum)
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 S/p WBRT; MRI SEP 14th--no evidence of any recurrent disease; signal changes consistent with treatment changes.  No abnormal enhancement concerning for recurrent disease or leptomeningeal involvement.  No clinical evidence of progression- STABLE.  # Left lower extremity pain-shooting/likely secondary to postherpetic neuralgia-improved; off gabapentin.   #Gait instability/orthostatic hypotension-s/p physical therapy; continue midodrine; orthostasis rechecked no significant drop noted.  Reviewed possible tapering of the midodrine [2.5 mg 3 times daily] however husband concerned about changing therapies.  Continue midodrine at this time.  # Neurocognitive issues/lack of motivation/extreme fatigue-multifactorial [malignancy; chemotherapy radiation] -stable.  # Vaccination counselling: Recommend Flu shot; and also recommend #3 booster [2 months from #2 booster].   # DISPOSITION: # port flush in 3 months #Follow up in 6  months MD; labs- cbc/cmp/ldh; port flush [double lumen]-MRI brain Dr.B

## 2021-01-26 NOTE — Progress Notes (Signed)
Patient tolerated port flush/ lab well today, port flushes well. Blood return noted. Patient awaiting for MD apt.

## 2021-01-26 NOTE — Progress Notes (Signed)
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 CT- 1-2 CM LN subpectoral/medistinal/ retro-peritoneal/pelvic/ Right inguinal LN 1.6cm- Bx- DLBCL- ABC; myc-POS; FISH gene re-arragement-NEG. PET- MULTIPLE LN/ Bone involvement; R-CHOP x6- CR'# OCT 2017- PET scan- CR; DEC 22nd 2017- BMBx- "atypical large B cells- <1%  [[facility]; II opinion- Dr.Grover- review of path- not concerning for residual lymphoma]; recommend surviellance  # March 26th  2018PET- NED  # Lumbar puncture- difficult-spinal headache. S/p high dose MXT x4.  ------------------------------------------------------------------- # NOV 2018-recurrent diffuse large B-cell lymphoma [right axilla lymph node biopsy proven]; PET- multiple bone lesions; axillary adenopathy; splenomegaly uptake  # Nov 16th 2018- R-; s/p 3 cycles-CR' Auto-Stem cell Transplant date: 07/11/17 [facility]; Dr.Vincent]; May 1st 2019- PET CR.   # OCT 2020- MASS  spanning the corpus callosum. Brain Biopsy  at [facility]-DLBCL. CD20+. EBV -, Ki67+, Pax5+. Port placed 10/27, R-MPV started 10/27 [facility]; Dr.Grover]. s/p WBRT ; MARCH 31st- MRI- remission/Stable disease   ---------------------------------------------------------- # June 2017-Elevated LFTs- valacylovir/diflucan; resolved.   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   # May 2017- EF- 55-65%  #August 2019-double vision; secondary to refractive error-prism improved. 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  Patient denies any recent hospitalizations.  Patient has weaned herself off gabapentin/has minimal postherpetic neuralgia.  Patient continues to have difficulty with memory/executive functions.  Otherwise no headaches no nausea no vomiting.    Review of Systems  Constitutional:  Positive for malaise/fatigue. Negative for chills, diaphoresis, fever and weight loss.  HENT:  Negative for nosebleeds and sore throat.   Eyes:  Negative for double vision.  Respiratory:  Negative for cough, hemoptysis, sputum production and wheezing.   Cardiovascular:  Negative for chest pain, orthopnea and leg swelling.  Gastrointestinal:  Negative for abdominal pain, blood in stool, constipation, diarrhea, heartburn, melena, nausea and vomiting.  Genitourinary:  Negative for dysuria, frequency and urgency.  Musculoskeletal:  Negative for back pain and joint pain.  Skin: Negative.  Negative for itching and rash.  Neurological:  Positive for dizziness.  Negative for tingling, focal weakness and weakness.  Endo/Heme/Allergies:  Does not bruise/bleed easily.  Psychiatric/Behavioral:  Positive for memory loss. Negative for depression. The patient does  not have insomnia.      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   Leg cramps    Lymphoma ([place]) 09/14/15   Monoclonal B cell lymphoma   Night sweats    Osteoporosis    Overweight(278.02)    Obesity   Palpitations    Shortness of breath dyspnea    Tachycardia    Thrombocytopenia (HCC)     PAST SURGICAL HISTORY :   Past Surgical History:  Procedure Laterality Date   ABDOMINAL HYSTERECTOMY  2005   BONE MARROW BIOPSY  09/14/15   CHOLECYSTECTOMY  1997   COLONOSCOPY  07/05/2014   ESOPHAGOGASTRODUODENOSCOPY  07/05/2014   INGUINAL LYMPH NODE BIOPSY Right 10/05/2015   Procedure: INGUINAL LYMPH NODE BIOPSY;  Surgeon: Christene Lye, MD;  Location: [facility] ORS;  Service: General;  Laterality: Right;   PERIPHERAL VASCULAR CATHETERIZATION N/A 09/27/2015   Procedure: Glori Luis Cath Insertion;  Surgeon: Algernon Huxley, MD;  Location: [place] CV LAB;  Service: Cardiovascular;  Laterality: N/A;   PORTA CATH REMOVAL Right 06/03/2017   Procedure: PORTA CATH REMOVAL, with venous thrombectomy;  Surgeon: Algernon Huxley, MD;  Location: [place] CV LAB;  Service: Cardiovascular;  Laterality: Right;   PORTACATH PLACEMENT Right     FAMILY HISTORY :   Family History  Problem Relation Age of Onset   Heart disease Father        CABG x 4   Multiple myeloma Father    Hypertension Mother    Pancreatic cancer Mother    Ulcers Mother    Asthma Mother    Brain cancer Maternal Uncle    Multiple myeloma Maternal Uncle     Diabetes Neg Hx    Breast cancer Neg Hx     SOCIAL HISTORY:   Social History   Tobacco Use   Smoking status: Never   Smokeless tobacco: Never  Vaping Use   Vaping Use: Never used  Substance Use Topics   Alcohol use: No    Alcohol/week: 0.0 standard drinks   Drug use: No    ALLERGIES:  is allergic to ondansetron hcl, oxycodone, and rituximab.  MEDICATIONS:  Current Outpatient Medications  Medication Sig Dispense Refill   acetaminophen (TYLENOL) 650 MG CR tablet Take 650 mg by mouth every 8 (eight) hours as needed for pain.     ALPRAZolam (XANAX) 0.25 MG tablet TAKE ONE TABLET AT BEDTIME AS NEEDED FORANXIETY 20 tablet 2   midodrine (PROAMATINE) 5 MG tablet Take 1.5 tablets by mouth 3 (three) times daily.      ondansetron (ZOFRAN-ODT) 4 MG disintegrating tablet Take 1 tablet (4 mg total) by mouth every 8 (eight) hours as needed for nausea or vomiting. 60 tablet 2   gabapentin (NEURONTIN) 300 MG capsule TAKE 1 CAPUSLE BY MOUTH AT NIGHT. THE NEXT DAY TAKE 1 CAPSULE DURING THE DAY AND THEN AT NIGHT. CONTINUE ON 2 CAPSULES DAILY (Patient not taking: Reported on 01/26/2021) 60 capsule 3   No current facility-administered medications for this visit.   Facility-Administered Medications Ordered in Other Visits  Medication Dose Route Frequency Provider Last Rate Last Admin   0.9 %  sodium chloride infusion   Intravenous  Continuous Evlyn Kanner, NP   Stopped at 10/14/15 1312   methotrexate (50 mg/ml) 6.3 g in sodium chloride 0.9 % 1,000 mL injection   Intravenous Once Charlaine Dalton R, MD       sodium chloride flush (NS) 0.9 % injection 10 mL  10 mL Intravenous PRN Cammie Sickle, MD   10 mL at 10/05/16 1400    PHYSICAL EXAMINATION: ECOG PERFORMANCE STATUS: 0 - Asymptomatic  BP 125/72 (BP Location: Left Arm, Patient Position: Sitting)   Pulse 69   Temp (!) 97.3 F (36.3 C) (Tympanic)   Resp 18   Wt 184 lb (83.5 kg)   SpO2 99%   BMI 32.59 kg/m   Filed Weights    01/26/21 1110  Weight: 184 lb (83.5 kg)   Physical Exam Constitutional:      Comments: Ambulating independently.  Accompanied by husband.    HENT:     Head: Normocephalic and atraumatic.     Mouth/Throat:     Pharynx: No oropharyngeal exudate.  Eyes:     Pupils: Pupils are equal, round, and reactive to light.  Cardiovascular:     Rate and Rhythm: Normal rate and regular rhythm.  Pulmonary:     Effort: Pulmonary effort is normal. No respiratory distress.     Breath sounds: Normal breath sounds. No wheezing.  Abdominal:     General: Bowel sounds are normal. There is no distension.     Palpations: Abdomen is soft. There is no mass.     Tenderness: There is no abdominal tenderness. There is no guarding or rebound.  Musculoskeletal:        General: No tenderness. Normal range of motion.     Cervical back: Normal range of motion and neck supple.  Skin:    General: Skin is warm.  Neurological:     Mental Status: She is alert and oriented to person, place, and time.  Psychiatric:        Mood and Affect: Affect normal.    LABORATORY DATA:  I have reviewed the data as listed    Component Value Date/Time   NA 139 01/26/2021 0958   NA 138 08/12/2019 0000   K 3.8 01/26/2021 0958   CL 103 01/26/2021 0958   CO2 28 01/26/2021 0958   GLUCOSE 127 (H) 01/26/2021 0958   BUN 16 01/26/2021 0958   BUN 8 08/12/2019 0000   CREATININE 1.11 (H) 01/26/2021 0958   CALCIUM 9.3 01/26/2021 0958   PROT 6.6 01/26/2021 0958   ALBUMIN 4.4 01/26/2021 0958   AST 16 01/26/2021 0958   ALT 12 01/26/2021 0958   ALKPHOS 72 01/26/2021 0958   BILITOT 0.8 01/26/2021 0958   GFRNONAA 55 (L) 01/26/2021 0958   GFRAA >60 11/24/2019 1442    No results found for: SPEP, UPEP  Lab Results  Component Value Date   WBC 5.7 01/26/2021   NEUTROABS 4.2 01/26/2021   HGB 12.7 01/26/2021   HCT 37.1 01/26/2021   MCV 91.2 01/26/2021   PLT 133 (L) 01/26/2021      Chemistry      Component Value Date/Time   NA 139  01/26/2021 0958   NA 138 08/12/2019 0000   K 3.8 01/26/2021 0958   CL 103 01/26/2021 0958   CO2 28 01/26/2021 0958   BUN 16 01/26/2021 0958   BUN 8 08/12/2019 0000   CREATININE 1.11 (H) 01/26/2021 0958   GLU 88 08/12/2019 0000      Component Value Date/Time  CALCIUM 9.3 01/26/2021 0958   ALKPHOS 72 01/26/2021 0958   AST 16 01/26/2021 0958   ALT 12 01/26/2021 0958   BILITOT 0.8 01/26/2021 0958       RADIOGRAPHIC STUDIES: I have personally reviewed the radiological images as listed and agreed with the findings in the report. MR Brain W Wo Contrast  Result Date: 01/25/2021 CLINICAL DATA:  Secondary non EBV mediated lymphoma of the brain. Follow-up. EXAM: MRI HEAD WITHOUT AND WITH CONTRAST TECHNIQUE: Multiplanar, multiecho pulse sequences of the brain and surrounding structures were obtained without and with intravenous contrast. CONTRAST:  7.43m GADAVIST GADOBUTROL 1 MMOL/ML IV SOLN COMPARISON:  08/19/2020.  04/18/2020. FINDINGS: Brain: No change since the previous examination. Brainstem and cerebellum appear normal. Cerebral hemispheres show widespread increased T2 and FLAIR signal throughout the white matter without evidence of focal mass, restricted diffusion or abnormal enhancement. Small cystic area in the left frontal white matter is unchanged. No evidence of cortical stroke or lesion. No hemorrhage, hydrocephalus or extra-axial fluid collection. No abnormal enhancement of the brain or leptomeninges occurs. Vascular: Major vessels at the base of the brain show flow. Skull and upper cervical spine: Negative Sinuses/Orbits: Clear/normal Other: None IMPRESSION: No change since the previous examination. No evidence of residual or recurrent lymphoma. Widespread abnormal T2 and FLAIR signal throughout the white matter of the cerebral hemispheres consistent with post treatment changes. Electronically Signed   By: MNelson ChimesM.D.   On: 01/25/2021 15:59     ASSESSMENT & PLAN:  Secondary  non-EBV mediated lymphoma of brain ([place] #Recurrent diffuse large cell lymphoma of the brain-sp  R-MPV's [with intrathecal cytarabine]. S/p WBRT; MRI SEP 14th--no evidence of any recurrent disease; signal changes consistent with treatment changes.  No abnormal enhancement concerning for recurrent disease or leptomeningeal involvement.  No clinical evidence of progression- STABLE.  # Left lower extremity pain-shooting/likely secondary to postherpetic neuralgia-improved; off gabapentin.    #Gait instability/orthostatic hypotension-s/p physical therapy; continue midodrine; orthostasis rechecked no significant drop noted.  Reviewed possible tapering of the midodrine [2.5 mg 3 times daily] however husband concerned about changing therapies.  Continue midodrine at this time.  # Neurocognitive issues/lack of motivation/extreme fatigue-multifactorial [malignancy; chemotherapy radiation] -stable.  # Vaccination counselling: Recommend Flu shot; and also recommend #3 booster [2 months from #2 booster].   # DISPOSITION: # port flush in 3 months #Follow up in 6  months MD; labs- cbc/cmp/ldh; port flush [double lumen]-MRI brain Dr.B      Orders Placed This Encounter  Procedures   MR BRAIN W WO CONTRAST    Standing Status:   Future    Standing Expiration Date:   01/26/2022    Order Specific Question:   If indicated for the ordered procedure, I authorize the administration of contrast media per Radiology protocol    Answer:   Yes    Order Specific Question:   What is the patient's sedation requirement?    Answer:   No Sedation    Order Specific Question:   Does the patient have a pacemaker or implanted devices?    Answer:   No    Order Specific Question:   Use SRS Protocol?    Answer:   No    Order Specific Question:   [facility]    All questions were answered. The patient knows to call the clinic with any problems, questions  or concerns.      GLenetta QuakerR  Rogue Bussing, MD 01/26/2021 7:23 PM

## 2021-01-26 NOTE — Progress Notes (Signed)
Pt in for follow up with husband.  Denies any concerns today.  States had a mole excised by dermatologist last week.

## 2021-01-27 ENCOUNTER — Telehealth: Payer: Self-pay | Admitting: Internal Medicine

## 2021-01-27 NOTE — Telephone Encounter (Signed)
Spoke with patient's husband to confirm upcoming appointments (including a Brain MRI in March 2023). He was agreeable to all days and times and stated he would also check MyChart. Sending AVS in the mail.

## 2021-03-13 ENCOUNTER — Telehealth: Payer: Self-pay

## 2021-03-13 ENCOUNTER — Other Ambulatory Visit (INDEPENDENT_AMBULATORY_CARE_PROVIDER_SITE_OTHER): Payer: Medicare Other

## 2021-03-13 DIAGNOSIS — B9620 Unspecified Escherichia coli [E. coli] as the cause of diseases classified elsewhere: Secondary | ICD-10-CM

## 2021-03-13 DIAGNOSIS — R300 Dysuria: Secondary | ICD-10-CM

## 2021-03-13 DIAGNOSIS — N390 Urinary tract infection, site not specified: Secondary | ICD-10-CM

## 2021-03-13 LAB — URINALYSIS, ROUTINE W REFLEX MICROSCOPIC
Bilirubin Urine: NEGATIVE
Ketones, ur: NEGATIVE
Nitrite: POSITIVE — AB
Specific Gravity, Urine: 1.025 (ref 1.000–1.030)
Urine Glucose: NEGATIVE
Urobilinogen, UA: 0.2 (ref 0.0–1.0)
pH: 6 (ref 5.0–8.0)

## 2021-03-13 NOTE — Telephone Encounter (Signed)
Pt's husband called stating pt believes she has a UTI. I have placed the orders for pt to drop off urine today and scheduled for a follow up on Friday.

## 2021-03-16 DIAGNOSIS — B9620 Unspecified Escherichia coli [E. coli] as the cause of diseases classified elsewhere: Secondary | ICD-10-CM | POA: Insufficient documentation

## 2021-03-16 DIAGNOSIS — N390 Urinary tract infection, site not specified: Secondary | ICD-10-CM | POA: Insufficient documentation

## 2021-03-16 LAB — URINE CULTURE
MICRO NUMBER:: 12572380
SPECIMEN QUALITY:: ADEQUATE

## 2021-03-16 MED ORDER — CIPROFLOXACIN HCL 250 MG PO TABS: 250.0000 mg | ORAL_TABLET | Freq: Two times a day (BID) | ORAL | 0 refills | Status: AC

## 2021-03-16 NOTE — Addendum Note (Signed)
Addended by: Crecencio Mc on: 03/16/2021 09:44 PM   Modules accepted: Orders

## 2021-03-24 ENCOUNTER — Encounter: Payer: Self-pay | Admitting: Internal Medicine

## 2021-03-24 ENCOUNTER — Ambulatory Visit (INDEPENDENT_AMBULATORY_CARE_PROVIDER_SITE_OTHER): Payer: Medicare Other | Admitting: Internal Medicine

## 2021-03-24 VITALS — Ht 63.0 in

## 2021-03-24 DIAGNOSIS — N390 Urinary tract infection, site not specified: Secondary | ICD-10-CM | POA: Diagnosis not present

## 2021-03-24 DIAGNOSIS — C8333 Diffuse large B-cell lymphoma, intra-abdominal lymph nodes: Secondary | ICD-10-CM | POA: Diagnosis not present

## 2021-03-24 DIAGNOSIS — I951 Orthostatic hypotension: Secondary | ICD-10-CM

## 2021-03-24 DIAGNOSIS — B9620 Unspecified Escherichia coli [E. coli] as the cause of diseases classified elsewhere: Secondary | ICD-10-CM

## 2021-03-24 DIAGNOSIS — E782 Mixed hyperlipidemia: Secondary | ICD-10-CM | POA: Diagnosis not present

## 2021-03-24 DIAGNOSIS — C8599 Non-Hodgkin lymphoma, unspecified, extranodal and solid organ sites: Secondary | ICD-10-CM

## 2021-03-24 DIAGNOSIS — M8588 Other specified disorders of bone density and structure, other site: Secondary | ICD-10-CM

## 2021-03-24 NOTE — Progress Notes (Addendum)
Virtual Visit converted to Telephone Note  This visit type was conducted due to national recommendations for restrictions regarding the COVID-19 pandemic (e.g. social distancing).  This format is felt to be most appropriate for this patient at this time.  All issues noted in this document were discussed and addressed.  No physical exam was performed (except for noted visual exam findings with Video Visits).   I connected withNAME@ on 03/24/21 at 11:30 AM EST by  telephone and verified that I am speaking with the correct person using two identifiers. Location patient: home Location provider: work or home office Persons participating in the virtual visit: patient, provider and patient's husband   I discussed the limitations, risks, security and privacy concerns of performing an evaluation and management service by telephone and the availability of in person appointments. I also discussed with the patient that there may be a patient responsible charge related to this service. The patient expressed understanding and agreed to proceed.  Interactive audio and video telecommunications were attempted between this provider and patient, however failed, due to patient having technical difficulties  We continued and completed visit with audio only.  Reason for visit: follow up on recent UTI  HPI:  65 yr old female with  recurrent large  cell lymphoma diagnosed in 2017 at Stage 3, with CNS involvement  by biopsy in Oct 2020,   s/p XRT and palliative treatment with continued loss of executive function/mild cognitive impairment ,  recently treated in ED in June 2022 for profound weakness secondary to UTI  now with recurrent UTI  which presented with back pain   UTI confirmed,  secondary to E Coli  treated with cipro  bid x 5 days ending Nov 8 and still taking probiotic  Her back pain ha resolved,  but she is bothered by recurrent muscle cramps;  all over,  has been going on for months.  History of Orthostatic  hypotension:  has been managed with midodrine  current dose  7.5 mg  tid ,  no orthostatics have  been checked per patient and husband. Patient feels fine on current dose,  not sure if episodes of feeling off balance are due to [facility]  or due to changes in brain volume due to lymphomas of the brain  that was treated with XRT ,  then MTC?steroids. .  Home BP readings have been > 220 systolic.    ROS: See pertinent positives and negatives per HPI.  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   Leg cramps    Lymphoma ([place]) 09/14/15   Monoclonal B cell lymphoma   Night sweats    Osteoporosis    Overweight(278.02)    Obesity   Palpitations    Shortness of breath dyspnea    Tachycardia    Thrombocytopenia (HCC)     Past Surgical History:  Procedure Laterality Date   ABDOMINAL HYSTERECTOMY  2005   BONE MARROW BIOPSY  09/14/15   CHOLECYSTECTOMY  1997   COLONOSCOPY  07/05/2014   ESOPHAGOGASTRODUODENOSCOPY  07/05/2014   INGUINAL LYMPH NODE BIOPSY Right 10/05/2015   Procedure: INGUINAL LYMPH NODE BIOPSY;  Surgeon: Christene Lye, MD;  Location: [facility] ORS;  Service: General;  Laterality:  Right;   PERIPHERAL VASCULAR CATHETERIZATION N/A 09/27/2015   Procedure: Glori Luis Cath Insertion;  Surgeon: Algernon Huxley, MD;  Location: [place] CV LAB;  Service: Cardiovascular;  Laterality: N/A;   PORTA CATH REMOVAL Right 06/03/2017   Procedure: PORTA CATH REMOVAL, with venous thrombectomy;  Surgeon: Algernon Huxley, MD;  Location: [place] CV LAB;  Service: Cardiovascular;  Laterality: Right;   PORTACATH PLACEMENT Right     Family History  Problem Relation Age of Onset   Heart disease Father        CABG x 4   Multiple myeloma Father     Hypertension Mother    Pancreatic cancer Mother    Ulcers Mother    Asthma Mother    Brain cancer Maternal Uncle    Multiple myeloma Maternal Uncle    Diabetes Neg Hx    Breast cancer Neg Hx     SOCIAL HX:   reports that she has never smoked. She has never used smokeless tobacco. She reports that she does not drink alcohol and does not use drugs.    Current Outpatient Medications:    acetaminophen (TYLENOL) 650 MG CR tablet, Take 650 mg by mouth every 8 (eight) hours as needed for pain., Disp: , Rfl:    ALPRAZolam (XANAX) 0.25 MG tablet, TAKE ONE TABLET AT BEDTIME AS NEEDED FORANXIETY, Disp: 20 tablet, Rfl: 2   midodrine (PROAMATINE) 5 MG tablet, Take 1.5 tablets by mouth 3 (three) times daily. , Disp: , Rfl:    ondansetron (ZOFRAN-ODT) 4 MG disintegrating tablet, Take 1 tablet (4 mg total) by mouth every 8 (eight) hours as needed for nausea or vomiting., Disp: 60 tablet, Rfl: 2 No current facility-administered medications for this visit.  Facility-Administered Medications Ordered in Other Visits:    0.9 %  sodium chloride infusion, , Intravenous, Continuous, Herring, Orville Govern, NP, Stopped at 10/14/15 1312   methotrexate (50 mg/ml) 6.3 g in sodium chloride 0.9 % 1,000 mL injection, , Intravenous, Once, Brahmanday, Lenetta Quaker R, MD   sodium chloride flush (NS) 0.9 % injection 10 mL, 10 mL, Intravenous, PRN, Cammie Sickle, MD, 10 mL at 10/05/16 1400  EXAM:  VITALS per patient if applicable:   General impression: alert, cooperative and articulate.  No signs of being in distress  Lungs: speech is fluent sentence length suggests that patient is not short of breath and not punctuated by cough, sneezing or sniffing. Marland Kitchen   Psych: affect normal.  speech is articulate and non pressured .  Denies suicidal thoughts  _0   ASSESSMENT AND PLAN:  Discussed the following assessment and plan:  Recurrent UTI - Plan: Urinalysis, Routine w reflex microscopic, Urine Culture  E. 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 coli UTI (urinary tract infection) Presented with low back pain.  Treated with Cipro .  Advised to consider use of cranberry juice or tablets daily for prevention, and  increased water intake during sympotms   DLBCL (diffuse large B cell lymphoma) ([place]) Diagnosed in 2017 , treated with CHOP,  Recurrence noted in Feb 2018 treated with  autologous stem celll transplant  Followed until brain mass spanning corpus callosum in Oct  2020 confirmed with brain biopsy .    Hyperlipidemia  LDL and triglycerides have been at  goal without Given goals of care, no changes today  Lab Results  Component Value Date   CHOL 242 (H) 12/22/2018   HDL 59.60 12/22/2018   LDLCALC 122 (H) 10/04/2016   LDLDIRECT 142.0 12/22/2018   TRIG 269.0 (H) 12/22/2018   CHOLHDL 4 12/22/2018     Secondary non-EBV mediated lymphoma of brain (HCC) S.p intrathecal chemo and brain irradiation with diffuse white matter changes resulting in  cognitive decline.  n recurrence by Sept 2022 MRI  Treatment is considered palliative   Osteopenia Last DEXA 2012,  T [place] -2.3 in spine,   > -2.0 in femur .  Repeat ordered   Orthostatic hypotension advvised to continue midodrine 7.5 mg tid for now,  Will plan to decrease dose gradually for SBP > 140    I discussed the assessment and treatment plan with the patient. The patient was provided an opportunity to ask questions and all were answered. The patient agreed with the plan and demonstrated an understanding of the instructions.   The patient was advised to call back or seek an in-person evaluation if the symptoms worsen or if the condition fails to improve as anticipated.   I spent 30 minutes dedicated to the care of this patient on the date of this encounter  to include pre-visit review of his medical history,  non Face-to-face time with the patient , and post visit ordering of testing and therapeutics.    Crecencio Mc, MD

## 2021-03-25 DIAGNOSIS — I951 Orthostatic hypotension: Secondary | ICD-10-CM | POA: Insufficient documentation

## 2021-03-25 DIAGNOSIS — I959 Hypotension, unspecified: Secondary | ICD-10-CM | POA: Insufficient documentation

## 2021-03-25 DIAGNOSIS — I9589 Other hypotension: Secondary | ICD-10-CM | POA: Insufficient documentation

## 2021-03-25 NOTE — Assessment & Plan Note (Signed)
S.p intrathecal chemo and brain irradiation with diffuse white matter changes resulting in  cognitive decline.  n recurrence by Sept 2022 MRI  Treatment is considered palliative

## 2021-03-25 NOTE — Assessment & Plan Note (Signed)
advvised to continue midodrine 7.5 mg tid for now,  Will plan to decrease dose gradually for SBP > 140

## 2021-03-25 NOTE — Assessment & Plan Note (Signed)
Last DEXA 2012,  T [place] -2.3 in spine,   > -2.0 in femur .  Repeat ordered

## 2021-03-25 NOTE — Assessment & Plan Note (Signed)
  LDL and triglycerides have been at  goal without Given goals of care, no changes today  Lab Results  Component Value Date   CHOL 242 (H) 12/22/2018   HDL 59.60 12/22/2018   LDLCALC 122 (H) 10/04/2016   LDLDIRECT 142.0 12/22/2018   TRIG 269.0 (H) 12/22/2018   CHOLHDL 4 12/22/2018

## 2021-03-25 NOTE — Assessment & Plan Note (Addendum)
Diagnosed in 2017 , treated with CHOP,  Recurrence noted in Feb 2018 treated with  autologous stem celll transplant  Followed until brain mass spanning corpus callosum in Oct  2020 confirmed with brain biopsy .

## 2021-03-25 NOTE — Assessment & Plan Note (Signed)
Presented with low back pain.  Treated with Cipro .  Advised to consider use of cranberry juice or tablets daily for prevention, and  increased water intake during sympotms

## 2021-03-31 ENCOUNTER — Telehealth: Payer: Medicare Other | Admitting: Internal Medicine

## 2021-04-20 ENCOUNTER — Other Ambulatory Visit: Payer: Self-pay

## 2021-04-20 ENCOUNTER — Encounter: Payer: Self-pay | Admitting: Internal Medicine

## 2021-04-20 ENCOUNTER — Other Ambulatory Visit: Payer: Self-pay | Admitting: *Deleted

## 2021-04-20 ENCOUNTER — Ambulatory Visit (INDEPENDENT_AMBULATORY_CARE_PROVIDER_SITE_OTHER): Payer: Medicare Other | Admitting: Internal Medicine

## 2021-04-20 VITALS — BP 106/66 | HR 80 | Temp 95.7°F | Ht 63.0 in | Wt 186.4 lb

## 2021-04-20 DIAGNOSIS — Z79899 Other long term (current) drug therapy: Secondary | ICD-10-CM

## 2021-04-20 DIAGNOSIS — C8583 Other specified types of non-Hodgkin lymphoma, intra-abdominal lymph nodes: Secondary | ICD-10-CM

## 2021-04-20 DIAGNOSIS — R7301 Impaired fasting glucose: Secondary | ICD-10-CM

## 2021-04-20 DIAGNOSIS — F411 Generalized anxiety disorder: Secondary | ICD-10-CM

## 2021-04-20 DIAGNOSIS — E782 Mixed hyperlipidemia: Secondary | ICD-10-CM | POA: Diagnosis not present

## 2021-04-20 DIAGNOSIS — Z1231 Encounter for screening mammogram for malignant neoplasm of breast: Secondary | ICD-10-CM | POA: Diagnosis not present

## 2021-04-20 DIAGNOSIS — I951 Orthostatic hypotension: Secondary | ICD-10-CM

## 2021-04-20 LAB — CBC WITH DIFFERENTIAL/PLATELET
Basophils Absolute: 0.1 10*3/uL (ref 0.0–0.1)
Basophils Relative: 0.8 % (ref 0.0–3.0)
Eosinophils Absolute: 0.1 10*3/uL (ref 0.0–0.7)
Eosinophils Relative: 1.1 % (ref 0.0–5.0)
HCT: 38.3 % (ref 36.0–46.0)
Hemoglobin: 12.9 g/dL (ref 12.0–15.0)
Lymphocytes Relative: 14 % (ref 12.0–46.0)
Lymphs Abs: 0.9 10*3/uL (ref 0.7–4.0)
MCHC: 33.8 g/dL (ref 30.0–36.0)
MCV: 90.6 fl (ref 78.0–100.0)
Monocytes Absolute: 0.5 10*3/uL (ref 0.1–1.0)
Monocytes Relative: 7.5 % (ref 3.0–12.0)
Neutro Abs: 5 10*3/uL (ref 1.4–7.7)
Neutrophils Relative %: 76.6 % (ref 43.0–77.0)
Platelets: 176 10*3/uL (ref 150.0–400.0)
RBC: 4.22 Mil/uL (ref 3.87–5.11)
RDW: 12.8 % (ref 11.5–15.5)
WBC: 6.5 10*3/uL (ref 4.0–10.5)

## 2021-04-20 LAB — COMPREHENSIVE METABOLIC PANEL
ALT: 17 U/L (ref 0–35)
AST: 15 U/L (ref 0–37)
Albumin: 4.5 g/dL (ref 3.5–5.2)
Alkaline Phosphatase: 77 U/L (ref 39–117)
BUN: 14 mg/dL (ref 6–23)
CO2: 30 mEq/L (ref 19–32)
Calcium: 9.9 mg/dL (ref 8.4–10.5)
Chloride: 104 mEq/L (ref 96–112)
Creatinine, Ser: 1.14 mg/dL (ref 0.40–1.20)
GFR: 50.04 mL/min — ABNORMAL LOW (ref >60.00)
Glucose, Bld: 90 mg/dL (ref 70–99)
Potassium: 4.2 mEq/L (ref 3.5–5.1)
Sodium: 142 mEq/L (ref 135–145)
Total Bilirubin: 0.5 mg/dL (ref 0.2–1.2)
Total Protein: 6 g/dL (ref 6.0–8.3)

## 2021-04-20 LAB — LDL CHOLESTEROL, DIRECT: Direct LDL: 139 mg/dL

## 2021-04-20 LAB — TSH: TSH: 4.49 u[IU]/mL (ref 0.35–5.50)

## 2021-04-20 LAB — LIPID PANEL
Cholesterol: 239 mg/dL — ABNORMAL HIGH (ref 0–200)
HDL: 67.8 mg/dL (ref >39.00)
NonHDL: 171.26
Total CHOL/HDL Ratio: 4
Triglycerides: 292 mg/dL — ABNORMAL HIGH (ref 0.0–149.0)
VLDL: 58.4 mg/dL — ABNORMAL HIGH (ref 0.0–40.0)

## 2021-04-20 LAB — HEMOGLOBIN A1C: Hgb A1c MFr Bld: 5.4 % (ref 4.6–6.5)

## 2021-04-20 NOTE — Assessment & Plan Note (Signed)
,    Will plan to decrease dose gradually for SBP > 140

## 2021-04-20 NOTE — Assessment & Plan Note (Signed)
Last DEXA 2012,  T [place] -2.3 in spine,   > -2.0 in femur .  Repeat ordered

## 2021-04-20 NOTE — Assessment & Plan Note (Addendum)
Nonfasting LDL is < 160; triglycerides are elevated due to  Non fasting status  Lab Results  Component Value Date   CHOL 239 (H) 04/20/2021   HDL 67.80 04/20/2021   LDLCALC 122 (H) 10/04/2016   LDLDIRECT 139.0 04/20/2021   TRIG 292.0 (H) 04/20/2021   CHOLHDL 4 04/20/2021

## 2021-04-20 NOTE — Assessment & Plan Note (Addendum)
Using alprazolam for insomnia  Did not tolerated  lexapro.  The risks and benefits of benzodiazepine use were discussed with patient today including excessive sedation leading to respiratory depression,  impaired thinking/driving, and addiction.  Patient was advised to avoid concurrent use with alcohol, to use medication only as needed and not to share with others  .

## 2021-04-20 NOTE — Patient Instructions (Signed)
NO MORE COVID BOOSTERS!  YOUR RECENT EPISODE OF ENCEPHALITIS COULD HAVE BEEN WORSE   CHECK WITH CANCER CENTER RECORDS ABOUT PNEUMONIA VACCINATIONS   COLONOSCOPY NOT DUE UNTIL 2026  NO MAMMOGRAMS WHILE PORT IS IN PLACE

## 2021-04-20 NOTE — Progress Notes (Signed)
Patient ID: Stacy Murillo, female    DOB: 10/16/1955  Age: 65 y.o. MRN: [id number]  The patient is here for annual follow up and management of other chronic and acute problems.   The risk factors are reflected in the social history.  The roster of all physicians providing medical care to patient - is listed in the Snapshot section of the chart.  Activities of daily living:  The patient is 100% independent in all ADLs: dressing, toileting, feeding as well as independent mobility  Home safety : The patient has smoke detectors in the home. They wear seatbelts.  There are no firearms at home. There is no violence in the home.   There is no risks for hepatitis, STDs or HIV. There is no   history of blood transfusion. They have no travel history to infectious disease endemic areas of the world.  The patient has seen their dentist in the last six month. They have seen their eye doctor in the last year. They deny any hearing difficulty with regard to whispered voices and some television programs.  They have deferred audiologic testing in the last year.  They do not  have excessive sun exposure. Discussed the need for sun protection: hats, long sleeves and use of sunscreen if there is significant sun exposure.   Diet: the importance of a healthy diet is discussed. They do have a healthy diet.  The benefits of regular aerobic exercise were discussed. She walks 4 times per week  with her husband for 30  minutes.   Depression screen: there are no signs or vegative symptoms of depression- irritability, change in appetite, anhedonia, sadness/tearfullness.  Cognitive assessment: the patient has developed cognitive changes due to  lymphoma involving the brain .  Her husband manages their financial and personal affairs and is actively engaged.   The following portions of the patient's history were reviewed and updated as appropriate: allergies, current medications, past family history, past medical history,   past surgical history, past social history  and problem list.  Visual acuity was not assessed per patient preference since she has regular follow up with her ophthalmologist. Hearing and body mass index were assessed and reviewed.   During the course of the visit the patient was educated and counseled about appropriate screening and preventive services including : fall prevention , diabetes screening, nutrition counseling, colorectal cancer screening, and recommended immunizations.    CC: The primary encounter diagnosis was Mixed hyperlipidemia. Diagnoses of Impaired fasting glucose, Long-term use of high-risk medication, Encounter for screening mammogram for malignant neoplasm of breast, Generalized anxiety disorder, and Orthostatic hypotension were also pertinent to this visit.   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 is here for follow-up.  patient continues to have difficulty with memory/executive functions      2) Post herpetic neuralgia:  she  has weaned herself off gabapentin/has minimal postherpetic neuralgia.   3)  had a Post COVID booster vaccination reaction that lasted about 24 hours and started with high fevers,  rigors, followed by inability to ambulate , occurred in June after first booster .  Syndrome suggestive of encephalitis   4) diplopia managed with prism lenses,  due to brain irradiation.  Prism at last visit   [facility].  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   She has a past surgical history that includes Abdominal hysterectomy (2005); Bone marrow biopsy (09/14/15); Colonoscopy (07/05/2014); Esophagogastroduodenoscopy (07/05/2014); Portacath placement (Right); Cardiac catheterization (N/A, 09/27/2015); Cholecystectomy (1997); Inguinal lymph node biopsy (Right, 10/05/2015); and PORTA CATH REMOVAL (Right, 06/03/2017).   Her family history includes Asthma in her mother; Brain cancer in her maternal uncle; Heart disease in her father; Hypertension in her mother; Multiple myeloma in her father and maternal uncle; Pancreatic cancer in her mother; Ulcers in her mother.She reports that she has never smoked. She has never used smokeless tobacco. She reports that she does not drink alcohol and does not use drugs.  Outpatient Medications Prior to Visit  Medication Sig Dispense Refill   acetaminophen (TYLENOL) 650 MG CR tablet Take 650 mg by mouth every 8 (eight) hours as needed for pain.     ALPRAZolam (XANAX) 0.25 MG tablet TAKE ONE TABLET AT BEDTIME AS NEEDED FORANXIETY 20 tablet 2   midodrine (PROAMATINE) 5 MG tablet Take 1.5 tablets by mouth 3 (three) times daily.      ondansetron (ZOFRAN-ODT) 4 MG disintegrating tablet Take 1 tablet (4 mg total) by mouth every 8 (eight) hours as needed for nausea or vomiting. 60 tablet 2   Facility-Administered Medications Prior to Visit  Medication Dose Route Frequency Provider Last Rate Last Admin   0.9 %  sodium chloride infusion   Intravenous Continuous Herring, Orville Govern, NP   Stopped at 10/14/15 1312   methotrexate (50 mg/ml) 6.3 g in sodium chloride 0.9 % 1,000 mL injection   Intravenous Once Charlaine Dalton R, MD       sodium chloride flush (NS) 0.9 % injection 10 mL  10 mL Intravenous PRN Cammie Sickle, MD   10 mL at 10/05/16 1400    Review of Systems  Patient denies headache, fevers, malaise, unintentional weight loss, skin rash, eye pain, sinus  congestion and sinus pain, sore throat, dysphagia,  hemoptysis , cough, dyspnea, wheezing, chest pain, palpitations, orthopnea, edema, abdominal pain, nausea, melena, diarrhea, constipation, flank pain, dysuria, hematuria, urinary  Frequency, nocturia, numbness, tingling, seizures,  Focal weakness, Loss of consciousness,  Tremor, insomnia, depression, anxiety, and suicidal ideation.     Objective:  BP 106/66 (BP Location: Left Arm, Patient Position: Sitting, Cuff Size: Normal)   Pulse 80   Temp (!) 95.7 F (35.4 C) (Temporal)   Ht 5' 3" (1.6 m)   Wt 186 lb 6.4 oz (84.6 kg)   SpO2 95%   BMI 33.02 kg/m   Physical Exam  General appearance: alert, cooperative and appears stated age Ears: normal TM's and external ear canals both ears Throat: lips, mucosa, and tongue normal; teeth and gums normal Neck: no adenopathy, no carotid bruit, supple, symmetrical, trachea midline and thyroid not enlarged, symmetric, no tenderness/mass/nodules Back: symmetric, no curvature. ROM normal. No CVA tenderness. Breast: deferred by patient  Lungs: clear to auscultation bilaterally Heart: regular rate and rhythm, S1, S2 normal, no murmur, click, rub or gallop Abdomen: soft, non-tender; bowel sounds normal; no masses,  no organomegaly Pulses: 2+ and symmetric Skin: Skin color, texture, turgor normal. No rashes or lesions Lymph nodes: Cervical, supraclavicular, and axillary nodes normal.   Assessment & Plan:   Problem List Items Addressed This Visit     Screening for breast cancer   Orthostatic hypotension    ,  Will plan to decrease dose gradually for SBP > 140      Hyperlipidemia - Primary    Nonfasting LDL is < 160; triglycerides are elevated due to  Non fasting status  Lab Results  Component Value Date   CHOL 239 (H) 04/20/2021   HDL 67.80 04/20/2021   LDLCALC 122 (H) 10/04/2016   LDLDIRECT 139.0 04/20/2021   TRIG 292.0 (H) 04/20/2021   CHOLHDL 4 04/20/2021         Relevant Orders    Comp Met (CMET) (Completed)   Lipid Profile (Completed)   Generalized anxiety disorder    Using alprazolam for insomnia  Did not tolerated  lexapro.  The risks and benefits of benzodiazepine use were discussed with patient today including excessive sedation leading to respiratory depression,  impaired thinking/driving, and addiction.  Patient was advised to avoid concurrent use with alcohol, to use medication only as needed and not to share with others  .        Other Visit Diagnoses     Impaired fasting glucose       Relevant Orders   Comp Met (CMET) (Completed)   HgB A1c (Completed)   Long-term use of high-risk medication       Relevant Orders   CBC with Differential/Platelet (Completed)   TSH (Completed)       I am having Jailene B. [place] maintain her acetaminophen, midodrine, ondansetron, and ALPRAZolam.  No orders of the defined types were placed in this encounter.   There are no discontinued medications.  Follow-up: No follow-ups on file.   Crecencio Mc, MD

## 2021-04-25 ENCOUNTER — Other Ambulatory Visit: Payer: Self-pay | Admitting: Internal Medicine

## 2021-04-25 NOTE — Telephone Encounter (Signed)
RX Refill: xanax Last Seen: 04-20-21 Last Ordered: 07-22-20 Next Appt: 04-23-22

## 2021-04-27 ENCOUNTER — Inpatient Hospital Stay: Payer: Medicare Other | Attending: Internal Medicine

## 2021-04-27 ENCOUNTER — Inpatient Hospital Stay (HOSPITAL_BASED_OUTPATIENT_CLINIC_OR_DEPARTMENT_OTHER): Payer: Medicare Other | Admitting: Oncology

## 2021-04-27 ENCOUNTER — Other Ambulatory Visit: Payer: Self-pay

## 2021-04-27 ENCOUNTER — Encounter: Payer: Self-pay | Admitting: Oncology

## 2021-04-27 VITALS — BP 114/64 | HR 70 | Temp 97.8°F | Resp 16 | Wt 186.4 lb

## 2021-04-27 DIAGNOSIS — Z923 Personal history of irradiation: Secondary | ICD-10-CM | POA: Diagnosis not present

## 2021-04-27 DIAGNOSIS — Z79899 Other long term (current) drug therapy: Secondary | ICD-10-CM | POA: Insufficient documentation

## 2021-04-27 DIAGNOSIS — Z8572 Personal history of non-Hodgkin lymphomas: Secondary | ICD-10-CM | POA: Diagnosis not present

## 2021-04-27 DIAGNOSIS — C8583 Other specified types of non-Hodgkin lymphoma, intra-abdominal lymph nodes: Secondary | ICD-10-CM | POA: Diagnosis not present

## 2021-04-27 LAB — CBC WITH DIFFERENTIAL/PLATELET
Abs Immature Granulocytes: 0.02 10*3/uL (ref 0.00–0.07)
Basophils Absolute: 0 10*3/uL (ref 0.0–0.1)
Basophils Relative: 1 %
Eosinophils Absolute: 0.1 10*3/uL (ref 0.0–0.5)
Eosinophils Relative: 1 %
HCT: 37.9 % (ref 36.0–46.0)
Hemoglobin: 12.8 g/dL (ref 12.0–15.0)
Immature Granulocytes: 0 %
Lymphocytes Relative: 15 %
Lymphs Abs: 0.9 10*3/uL (ref 0.7–4.0)
MCH: 30.6 pg (ref 26.0–34.0)
MCHC: 33.8 g/dL (ref 30.0–36.0)
MCV: 90.7 fL (ref 80.0–100.0)
Monocytes Absolute: 0.5 10*3/uL (ref 0.1–1.0)
Monocytes Relative: 8 %
Neutro Abs: 4.8 10*3/uL (ref 1.7–7.7)
Neutrophils Relative %: 75 %
Platelets: 145 10*3/uL — ABNORMAL LOW (ref 150–400)
RBC: 4.18 MIL/uL (ref 3.87–5.11)
RDW: 12.3 % (ref 11.5–15.5)
WBC: 6.3 10*3/uL (ref 4.0–10.5)
nRBC: 0 % (ref 0.0–0.2)

## 2021-04-27 LAB — LACTATE DEHYDROGENASE: LDH: 120 U/L (ref 98–192)

## 2021-04-27 LAB — COMPREHENSIVE METABOLIC PANEL
ALT: 15 U/L (ref 0–44)
AST: 19 U/L (ref 15–41)
Albumin: 4.3 g/dL (ref 3.5–5.0)
Alkaline Phosphatase: 71 U/L (ref 38–126)
Anion gap: 10 (ref 5–15)
BUN: 14 mg/dL (ref 8–23)
CO2: 25 mmol/L (ref 22–32)
Calcium: 9 mg/dL (ref 8.9–10.3)
Chloride: 101 mmol/L (ref 98–111)
Creatinine, Ser: 1.09 mg/dL — ABNORMAL HIGH (ref 0.44–1.00)
GFR, Estimated: 56 mL/min — ABNORMAL LOW (ref >60)
Glucose, Bld: 135 mg/dL — ABNORMAL HIGH (ref 70–99)
Potassium: 3.7 mmol/L (ref 3.5–5.1)
Sodium: 136 mmol/L (ref 135–145)
Total Bilirubin: 0.7 mg/dL (ref 0.3–1.2)
Total Protein: 6.3 g/dL — ABNORMAL LOW (ref 6.5–8.1)

## 2021-04-27 MED ORDER — SODIUM CHLORIDE 0.9% FLUSH
10.0000 mL | Freq: Once | INTRAVENOUS | Status: AC
  Administered 2021-04-27: 10 mL via INTRAVENOUS
  Filled 2021-04-27: qty 10

## 2021-04-27 MED ORDER — HEPARIN SOD (PORK) LOCK FLUSH 100 UNIT/ML IV SOLN
500.0000 [IU] | Freq: Once | INTRAVENOUS | Status: AC
  Administered 2021-04-27: 500 [IU] via INTRAVENOUS
  Filled 2021-04-27: qty 5

## 2021-04-27 NOTE — Progress Notes (Signed)
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 CT- 1-2 CM LN subpectoral/medistinal/ retro-peritoneal/pelvic/ Right inguinal LN 1.6cm- Bx- DLBCL- ABC; myc-POS; FISH gene re-arragement-NEG. PET- MULTIPLE LN/ Bone involvement; R-CHOP x6- CR'# OCT 2017- PET scan- CR; DEC 22nd 2017- BMBx- "atypical large B cells- <1%  [[facility]; II opinion- Dr.Grover- review of path- not concerning for residual lymphoma]; recommend surviellance  # March 26th  2018PET- NED  # Lumbar puncture- difficult-spinal headache. S/p high dose MXT x4.  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   # OCT 2020- MASS  spanning the corpus callosum. Brain Biopsy  at [facility]-DLBCL. CD20+. EBV -, Ki67+, Pax5+. Port placed 10/27, R-MPV started 10/27 [facility]; Dr.Grover]. s/p WBRT ; MARCH 31st- MRI- remission/Stable disease   ---------------------------------------------------------- # June 2017-Elevated LFTs- valacylovir/diflucan; resolved.   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   # May 2017- EF- 55-65%  #August 2019-double vision; secondary to refractive error-prism improved. 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 Rogue Bussing for diffuse large B-cell lymphoma in the brain. She is status post  consolidation of radiation and is here for follow-up.  In the interim, she has been diagnosed with an E. coli UTI and treated with Cipro twice daily x5 days.  Overall, she is doing well.  She continues to have some difficulty with memory and stability.  She continues physical therapy twice per week.  She walks a mile outside daily with her husband.  States that she has to hold onto her husband when walking due to fear of falling. Denies any new recurrent headaches, nausea or vomiting.  Her husband mentions that she recently got her fourth COVID booster.  With her third booster she ended up in the hospital due to some neurological type changes and was found to have a UTI and gradually recovered.  With recent booster, she experienced the same symptoms which included symptoms consistent with encephalitis such as loss of body control and movement.  Again, symptoms gradually have improved and lasted for approximately 24 hours.  She has had some problems with low blood pressures and is currently on midodrine  7.5 mg 3 times daily.  BP has been  stable.  Review of Systems  Constitutional:  Positive for malaise/fatigue.  Neurological:  Positive for dizziness and weakness.  Psychiatric/Behavioral:  Positive for memory loss.      PAST MEDICAL HISTORY :  Past Medical History:  Diagnosis Date   Arthritis    Cancer ([place])    Diverticulosis 07/06/15   Dysrhythmia    tachycardia   Esophagitis 07/06/15   Fatigue    Gastritis 07/06/15   GERD (gastroesophageal reflux disease)    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   Jugular vein occlusion, 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   Leg cramps    Lymphoma ([place]) 09/14/15   Monoclonal B cell lymphoma   Night sweats    Osteoporosis    Overweight(278.02)    Obesity   Palpitations    Shortness of breath dyspnea    Tachycardia    Thrombocytopenia (HCC)     PAST SURGICAL HISTORY :   Past Surgical History:  Procedure Laterality Date   ABDOMINAL HYSTERECTOMY  2005   BONE MARROW BIOPSY  09/14/15   CHOLECYSTECTOMY  1997   COLONOSCOPY  07/05/2014   ESOPHAGOGASTRODUODENOSCOPY  07/05/2014   INGUINAL LYMPH NODE BIOPSY Right 10/05/2015   Procedure: INGUINAL LYMPH NODE BIOPSY;  Surgeon: Christene Lye, MD;  Location: [facility] ORS;  Service: General;  Laterality: Right;   PERIPHERAL VASCULAR CATHETERIZATION N/A 09/27/2015   Procedure: Glori Luis Cath Insertion;  Surgeon: Algernon Huxley, MD;  Location: [place] [facility];  Service: Cardiovascular;  Laterality: N/A;   PORTA CATH REMOVAL Right 06/03/2017   Procedure: PORTA CATH REMOVAL, with venous thrombectomy;  Surgeon: Algernon Huxley, MD;  Location: [place] [facility];  Service: Cardiovascular;  Laterality: Right;   PORTACATH PLACEMENT Right     FAMILY HISTORY :   Family History  Problem Relation Age of Onset   Heart disease Father        CABG x 4   Multiple myeloma Father    Hypertension Mother    Pancreatic cancer Mother    Ulcers Mother    Asthma Mother    Brain cancer Maternal Uncle    Multiple myeloma Maternal Uncle    Diabetes Neg Hx    Breast cancer Neg Hx      SOCIAL HISTORY:   Social History   Tobacco Use   Smoking status: Never   Smokeless tobacco: Never  Vaping Use   Vaping Use: Never used  Substance Use Topics   Alcohol use: No    Alcohol/week: 0.0 standard drinks   Drug use: No    ALLERGIES:  is allergic to ondansetron hcl, oxycodone, and rituximab.  MEDICATIONS:  Current Outpatient Medications  Medication Sig Dispense Refill   acetaminophen (TYLENOL) 650 MG CR tablet Take 650 mg by mouth every 8 (eight) hours as needed for pain.     ALPRAZolam (XANAX) 0.25 MG tablet TAKE ONE TABLET AT BEDTIME AS NEEDED FORANXIETY 20 tablet 2   midodrine (PROAMATINE) 5 MG tablet Take 1.5 tablets by mouth 3 (three) times daily.      ondansetron (ZOFRAN-ODT) 4 MG disintegrating tablet Take 1 tablet (4 mg total) by mouth every 8 (eight) hours as needed for nausea or vomiting. 60 tablet 2   No current facility-administered medications for this visit.   Facility-Administered Medications Ordered in Other Visits  Medication Dose Route Frequency Provider Last Rate Last Admin   0.9 %  sodium chloride infusion   Intravenous Continuous Herring, Orville Govern, NP   Stopped  at 10/14/15 1312   methotrexate (50 mg/ml) 6.3 g in sodium chloride 0.9 % 1,000 mL injection   Intravenous Once Charlaine Dalton R, MD       sodium chloride flush (NS) 0.9 % injection 10 mL  10 mL Intravenous PRN Charlaine Dalton R, MD   10 mL at 10/05/16 1400   sodium chloride flush (NS) 0.9 % injection 10 mL  10 mL Intravenous Once Cammie Sickle, MD        PHYSICAL EXAMINATION: ECOG PERFORMANCE STATUS: 0 - Asymptomatic  There were no vitals taken for this visit.  There were no vitals filed for this visit.  Physical Exam Constitutional:      Appearance: Normal appearance.  HENT:     Head: Normocephalic and atraumatic.  Eyes:     Pupils: Pupils are equal, round, and reactive to light.  Cardiovascular:     Rate and Rhythm: Normal rate and regular rhythm.      Heart sounds: Normal heart sounds. No murmur heard. Pulmonary:     Effort: Pulmonary effort is normal.     Breath sounds: Normal breath sounds. No wheezing.  Abdominal:     General: Bowel sounds are normal. There is no distension.     Palpations: Abdomen is soft.     Tenderness: There is no abdominal tenderness.  Musculoskeletal:        General: Normal range of motion.     Cervical back: Normal range of motion.  Skin:    General: Skin is warm and dry.     Findings: No rash.  Neurological:     Mental Status: She is alert and oriented to person, place, and time.     Gait: Gait is intact.  Psychiatric:        Mood and Affect: Mood and affect normal.        Cognition and Memory: Memory normal.        Judgment: Judgment normal.    LABORATORY DATA:  I have reviewed the data as listed    Component Value Date/Time   NA 142 04/20/2021 1046   NA 138 08/12/2019 0000   K 4.2 04/20/2021 1046   CL 104 04/20/2021 1046   CO2 30 04/20/2021 1046   GLUCOSE 90 04/20/2021 1046   BUN 14 04/20/2021 1046   BUN 8 08/12/2019 0000   CREATININE 1.14 04/20/2021 1046   CALCIUM 9.9 04/20/2021 1046   PROT 6.0 04/20/2021 1046   ALBUMIN 4.5 04/20/2021 1046   AST 15 04/20/2021 1046   ALT 17 04/20/2021 1046   ALKPHOS 77 04/20/2021 1046   BILITOT 0.5 04/20/2021 1046   GFRNONAA 55 (L) 01/26/2021 0958   GFRAA >60 11/24/2019 1442    No results found for: SPEP, UPEP  Lab Results  Component Value Date   WBC 6.3 04/27/2021   NEUTROABS 4.8 04/27/2021   HGB 12.8 04/27/2021   HCT 37.9 04/27/2021   MCV 90.7 04/27/2021   PLT 145 (L) 04/27/2021      Chemistry      Component Value Date/Time   NA 142 04/20/2021 1046   NA 138 08/12/2019 0000   K 4.2 04/20/2021 1046   CL 104 04/20/2021 1046   CO2 30 04/20/2021 1046   BUN 14 04/20/2021 1046   BUN 8 08/12/2019 0000   CREATININE 1.14 04/20/2021 1046   GLU 88 08/12/2019 0000      Component Value Date/Time   CALCIUM 9.9 04/20/2021 1046   ALKPHOS 77  04/20/2021 1046  AST 15 04/20/2021 1046   ALT 17 04/20/2021 1046   BILITOT 0.5 04/20/2021 1046       RADIOGRAPHIC STUDIES: I have personally reviewed the radiological images as listed and agreed with the findings in the report. No results found.   ASSESSMENT & PLAN: Stacy Murillo is a 65 year old female who presents for routine follow-up.  Clinically she appears to be doing well.  She is status post intrathecal cytarabine and WBRT.  Most recent imaging from 01/25/2021 was negative for progression of disease.  She is currently on surveillance.  She has been stable on midodrine 7.5 mg 3 times per day for her blood pressure.  Labs from today are unremarkable.  Recommend follow-up as scheduled in 3 to 4 months.  She will have a repeat MRI and see Dr. Rogue Bussing shortly after.  Recent COVID vaccine- Patient and has been spoke with Dr. Derrel Nip regarding the incident.  Dr. Derrel Nip recommended no additional COVID vaccines given symptoms happened for a second time after a COVID vaccine.  She does report receiving the [employer] initial COVID vaccine followed by 2 Moderna boosters.  Disposition- Return to clinic as scheduled in March for MRI of the brain and to see Dr. Rogue Bussing shortly after.  I spent 25 minutes dedicated to the care of this patient (face-to-face and non-face-to-face) on the date of the encounter to include what is described in the assessment and plan.  No problem-specific Assessment & Plan notes found for this encounter.   No orders of the defined types were placed in this encounter.  All questions were answered. The patient knows to call the clinic with any problems, questions or concerns.      Jacquelin Hawking, NP 04/27/2021 11:09 AM

## 2021-04-27 NOTE — Progress Notes (Signed)
Patient has leg cramping in the morning.

## 2021-05-08 ENCOUNTER — Encounter: Payer: Self-pay | Admitting: Internal Medicine

## 2021-05-09 ENCOUNTER — Encounter: Payer: Self-pay | Admitting: Family

## 2021-05-09 ENCOUNTER — Telehealth (INDEPENDENT_AMBULATORY_CARE_PROVIDER_SITE_OTHER): Payer: Medicare Other | Admitting: Family

## 2021-05-09 VITALS — Temp 102.0°F | Ht 63.0 in | Wt 186.5 lb

## 2021-05-09 DIAGNOSIS — U071 COVID-19: Secondary | ICD-10-CM | POA: Diagnosis not present

## 2021-05-09 MED ORDER — MOLNUPIRAVIR EUA 200MG CAPSULE: 4.0000 | ORAL_CAPSULE | Freq: Two times a day (BID) | ORAL | 0 refills | Status: AC

## 2021-05-09 NOTE — Progress Notes (Signed)
MyChart Video Visit    Virtual Visit via Video Note   This visit type was conducted due to national recommendations for restrictions regarding the COVID-19 Pandemic (e.g. social distancing) in an effort to limit this patient's exposure and mitigate transmission in our community. This patient is at least at moderate risk for complications without adequate follow up. This format is felt to be most appropriate for this patient at this time. Physical exam was limited by quality of the video and audio technology used for the visit. CMA was able to get the patient set up on a video visit.  Patient location: Home. Patient and provider in visit Provider location: Office  I discussed the limitations of evaluation and management by telemedicine and the availability of in person appointments. The patient expressed understanding and agreed to proceed.  Visit Date: 05/09/2021  Today's healthcare provider: Jeanie Sewer, NP     Subjective:    Patient ID: Stacy Murillo, female    DOB: July 08, 1955, 65 y.o.   MRN: [id number]  Chief Complaint  Patient presents with   Covid Positive    Symptoms started yesterday. OTC Tylenol helped sore throat. Husband says that symptoms are worse today.    Sore Throat    Tested positive yesterday.     HPI Upper Respiratory Infection: Symptoms include achiness, fever 102, and sore throat.  Onset of symptoms was 1 day ago, gradually worsening since that time. She is not drinking much. Evaluation to date: none.  Treatment to date: none.    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   Leg cramps    Lymphoma ([place]) 09/14/15   Monoclonal B cell lymphoma   Night sweats    Osteoporosis     Overweight(278.02)    Obesity   Palpitations    Shortness of breath dyspnea    Tachycardia    Thrombocytopenia (HCC)     Past Surgical History:  Procedure Laterality Date   ABDOMINAL HYSTERECTOMY  2005   BONE MARROW BIOPSY  09/14/15   CHOLECYSTECTOMY  1997   COLONOSCOPY  07/05/2014   ESOPHAGOGASTRODUODENOSCOPY  07/05/2014   INGUINAL LYMPH NODE BIOPSY Right 10/05/2015   Procedure: INGUINAL LYMPH NODE BIOPSY;  Surgeon: Christene Lye, MD;  Location: [facility];  Service: General;  Laterality: Right;   PERIPHERAL VASCULAR CATHETERIZATION N/A 09/27/2015   Procedure: Glori Luis Cath Insertion;  Surgeon: Algernon Huxley, MD;  Location: [place] [facility];  Service: Cardiovascular;  Laterality: N/A;   PORTA CATH REMOVAL Right 06/03/2017   Procedure: PORTA CATH REMOVAL, with venous thrombectomy;  Surgeon: Algernon Huxley, MD;  Location: [place] [facility];  Service: Cardiovascular;  Laterality: Right;   PORTACATH PLACEMENT Right     Outpatient Medications Prior to Visit  Medication Sig Dispense Refill   acetaminophen (TYLENOL) 650 MG CR tablet Take 650 mg by mouth every 8 (eight) hours as needed for pain.     ALPRAZolam (XANAX) 0.25 MG tablet TAKE ONE TABLET AT BEDTIME AS NEEDED FORANXIETY 20 tablet 2   midodrine (PROAMATINE) 5 MG tablet Take 1.5 tablets by mouth 3 (three) times daily.      ondansetron (ZOFRAN-ODT) 4 MG disintegrating tablet Take 1 tablet (4  mg total) by mouth every 8 (eight) hours as needed for nausea or vomiting. 60 tablet 2   Facility-Administered Medications Prior to Visit  Medication Dose Route Frequency Provider Last Rate Last Admin   0.9 %  sodium chloride infusion   Intravenous Continuous Herring, Orville Govern, NP   Stopped at 10/14/15 1312   methotrexate (50 mg/ml) 6.3 g in sodium chloride 0.9 % 1,000 mL injection   Intravenous Once Charlaine Dalton R, MD       sodium chloride flush (NS) 0.9 % injection 10 mL  10 mL Intravenous PRN Cammie Sickle, MD   10 mL at  10/05/16 1400    Allergies  Allergen Reactions   Ondansetron Hcl Other (See Comments)    Severe constipation    Oxycodone     Biliary colic with opioids s/p cholecystectomy     Rituximab Other (See Comments)    Pt reports throat tightness and facial swelling and redness during infusion of Rituxan        Objective:     Physical Exam Vitals and nursing note reviewed.  Constitutional:      General: She is not in acute distress.    Appearance: Normal appearance.  HENT:     Head: Normocephalic.  Pulmonary:     Effort: No respiratory distress.  Musculoskeletal:     Cervical back: Normal range of motion.  Skin:    General: Skin is dry.     Coloration: Skin is not pale.  Neurological:     Mental Status: She is alert and oriented to person, place, and time.  Psychiatric:        Mood and Affect: Mood normal.   Temp (!) 102 F (38.9 C) (Temporal)    Ht _0  (1.6 m)    Wt 186 lb 8.2 oz (84.6 kg)    BMI 33.04 kg/m   Wt Readings from Last 3 Encounters:  05/09/21 186 lb 8.2 oz (84.6 kg)  04/27/21 186 lb 6.4 oz (84.6 kg)  04/20/21 186 lb 6.4 oz (84.6 kg)       Assessment & Plan:   Problem List Items Addressed This Visit       Other   COVID-19 - Primary    Sending Molnupiravir, pt advised of FDA label for emergency use, how to take, & SE. Advised of CDC guidelines for self isolation/ ending isolation.  Advised of safe practice guidelines. Symptom Tier reviewed.  Encouraged to monitor for any worsening symptoms; watch for increased shortness of breath, weakness, and signs of dehydration. Instructed to rest and hydrate well.  Advised to wear a mask if necessary to leave the house.      Relevant Medications   molnupiravir EUA (LAGEVRIO) 200 mg CAPS capsule     I discussed the assessment and treatment plan with the patient. The patient was provided an opportunity to ask questions and all were answered. The patient agreed with the plan and demonstrated an understanding of  the instructions.   The patient was advised to call back or seek an in-person evaluation if the symptoms worsen or if the condition fails to improve as anticipated.  I provided 23 minutes of face-to-face time during this encounter.   Jeanie Sewer, NP [place] [phone] (phone) [phone] (fax)  [place]

## 2021-05-09 NOTE — Telephone Encounter (Signed)
Spoke with pt's husband and he stated pt has been scheduled for a virtual at 10 am.

## 2021-05-09 NOTE — Assessment & Plan Note (Signed)
Sending Molnupiravir, pt advised of FDA label for emergency use, how to take, & SE. Advised of CDC guidelines for self isolation/ ending isolation.  Advised of safe practice guidelines. Symptom Tier reviewed.  Encouraged to monitor for any worsening symptoms; watch for increased shortness of breath, weakness, and signs of dehydration. Instructed to rest and hydrate well.  Advised to wear a mask if necessary to leave the house.

## 2021-06-06 ENCOUNTER — Encounter: Payer: Self-pay | Admitting: Internal Medicine

## 2021-06-29 ENCOUNTER — Ambulatory Visit (INDEPENDENT_AMBULATORY_CARE_PROVIDER_SITE_OTHER): Payer: Medicare Other

## 2021-06-29 VITALS — Ht 63.0 in | Wt 186.0 lb

## 2021-06-29 DIAGNOSIS — Z0000 Encounter for general adult medical examination without abnormal findings: Secondary | ICD-10-CM

## 2021-06-29 NOTE — Progress Notes (Addendum)
Subjective:   Stacy Murillo is a 66 y.o. female who presents for Medicare Annual (Subsequent) preventive examination.  Review of Systems    No ROS.  Medicare Wellness Virtual Visit.  Visual/audio telehealth visit, UTA vital signs.   See social history for additional risk factors.   Cardiac Risk Factors include: advanced age (>9mn, >>40women)     Objective:    Today's Vitals   06/29/21 0904  Weight: 186 lb (84.4 kg)  Height: _0  (1.6 m)   Body mass index is 32.95 kg/m.  Advanced Directives 06/29/2021 04/27/2021 01/26/2021 10/25/2020 10/18/2020 06/28/2020 04/19/2020  Does Patient Have a Medical Advance Directive? Yes Yes Yes Yes No Yes Yes  Type of AParamedicof AKendrickLiving will HMount JoyLiving will HNorth BendLiving will Living will;Healthcare Power of ADuneanLiving will HFairchildLiving will  Does patient want to make changes to medical advance directive? No - Patient declined - - - - No - Patient declined -  Copy of [place] Chart? No - copy requested - - - - No - copy requested -  Would patient like information on creating a medical advance directive? - - - - No - Patient declined - -  Some encounter information is confidential and restricted. Go to Review Flowsheets activity to see all data.    Current Medications (verified) Outpatient Encounter Medications as of 06/29/2021  Medication Sig   acetaminophen (TYLENOL) 650 MG CR tablet Take 650 mg by mouth every 8 (eight) hours as needed for pain.   ALPRAZolam (XANAX) 0.25 MG tablet TAKE ONE TABLET AT BEDTIME AS NEEDED FORANXIETY   midodrine (PROAMATINE) 5 MG tablet Take 1.5 tablets by mouth 3 (three) times daily.    ondansetron (ZOFRAN-ODT) 4 MG disintegrating tablet Take 1 tablet (4 mg total) by mouth every 8 (eight) hours as needed for nausea or vomiting.   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   Leg cramps    [place]   Monoclonal B cell lymphoma   Night sweats    Osteoporosis    Overweight(278.02)    Obesity   Palpitations    Shortness of breath dyspnea    Tachycardia    [place]rombocyt[place] ([place]    Past Surgical History:  Procedure Laterality Date   ABDOMINAL HYSTERECTOMY  2005   BONE MARROW BIOPSY  09/14/15   CHOLECYSTECTOMY  1997   COLONOSCOPY  07/05/2014   ESOPHAGOGASTRODUODENOSCOPY  07/05/2014   INGUINAL LYMPH NODE BIOPSY Right 10/05/2015   Procedure: INGUINAL LYMPH NODE BIOPSY;  Surgeon: SChristene Lye MD;  Location: [facility];  Service: General;  Laterality: Right;   PERIPHERAL VASCULAR CATHETERIZATION N/A 09/27/2015   Procedure: PGlori LuisCath Insertion;  Surgeon: JAlgernon Huxley MD;  [facility];  Service: Cardiovascular;  Laterality:  N/A;   PORTA CATH REMOVAL Right 06/03/2017   Procedure: PORTA CATH REMOVAL, with venous thrombectomy;  Surgeon: Algernon Huxley, MD;  Location: [place] [facility];  Service: Cardiovascular;  Laterality: Right;   PORTACATH PLACEMENT Right    Family History  Problem Relation Age of Onset   Heart disease Father        CABG x 4   Multiple myeloma Father    Hypertension Mother    Pancreatic cancer Mother    Ulcers Mother    Asthma Mother    Brain cancer Maternal Uncle    Multiple myeloma Maternal Uncle    Diabetes Neg Hx     Breast cancer Neg Hx    Social History   Socioeconomic History   Marital status: Married    Spouse name: Not on file   Number of children: Not on file   Years of education: Not on file   Highest education level: Not on file  Occupational History   Not on file  Tobacco Use   Smoking status: Never   Smokeless tobacco: Never  Vaping Use   Vaping Use: Never used  Substance and Sexual Activity   Alcohol use: No    Alcohol/week: 0.0 standard drinks   Drug use: No   Sexual activity: Yes    Birth control/protection: None  Other Topics Concern   Not on file  Social History Narrative   Married   Does not get regular exercise   Social Determinants of Health   Financial Resource Strain: Low Risk    Difficulty of Paying Living Expenses: Not hard at all  Food Insecurity: No Food Insecurity   Worried About Charity fundraiser in the Last Year: Never true   Ran Out of Food in the Last Year: Never true  Transportation Needs: No Transportation Needs   Lack of Transportation (Medical): No   Lack of Transportation (Non-Medical): No  Physical Activity: Sufficiently Active   Days of Exercise per Week: 5 days   Minutes of Exercise per Session: 30 min  Stress: No Stress Concern Present   Feeling of Stress : Not at all  Social Connections: Unknown   Frequency of Communication with Friends and Family: Not on file   Frequency of Social Gatherings with Friends and Family: Not on file   Attends Religious Services: Not on Electrical engineer or Organizations: Not on file   Attends Archivist Meetings: Not on file   Marital Status: Married    Tobacco Counseling Counseling given: Not Answered   Clinical Intake:  Pre-visit preparation completed: Yes        Diabetes: No  How often do you need to have someone help you when you read instructions, pamphlets, or other written materials from your doctor or pharmacy?: 1 - Never    Interpreter Needed?: No       Activities of Daily Living In your present state of health, do you have any difficulty performing the following activities: 06/29/2021  Hearing? N  Vision? N  Difficulty concentrating or making decisions? Y  Walking or climbing stairs? Y  Dressing or bathing? N  Doing errands, shopping? Y  Preparing Food and eating ? Y  Using the Toilet? N  In the past six months, have you accidently leaked urine? N  Do you have problems with loss of bowel control? N  Managing your Medications? Y  Managing your Finances? Y  Some recent data might be hidden  Patient Care Team: Crecencio Mc, MD as PCP - General (Internal Medicine) Cammie Sickle, MD as Consulting Physician (Internal Medicine) Christene Lye, MD (General Surgery) [name], MD as Consulting Physician (Cardiology)  Indicate any recent Medical Services you may have received from other than [facility] providers in the past year (date may be approximate).     Assessment:   This is a routine wellness examination for Stacy Murillo.  Virtual Visit via Telephone Note  I connected with  Stacy Murillo on 06/29/21 at  9:00 AM EST by telephone and verified that I am speaking with the correct person using two identifiers.  Persons participating in the virtual visit: patient/spouse/Nurse Health Advisor   I discussed the limitations, risks, security and privacy concerns of performing an evaluation and management service by telephone and the availability of in person appointments. The patient expressed understanding and agreed to proceed.  Interactive audio and video telecommunications were attempted between this nurse and patient, however failed, due to patient having technical difficulties OR patient did not have access to video capability.  We continued and completed visit with audio only.  Some vital signs may be absent or patient reported.   Hearing/Vision screen Hearing Screening - Comments:: Patient is able to hear  conversational tones without difficulty.  No issues reported. Vision Screening - Comments:: Wears corrective lenses Visual acuity not assessed, virtual visit. They have seen their ophthalmologist in the last 12 months.   Dietary issues and exercise activities discussed: Current Exercise Habits: Home exercise routine, Type of exercise: walking;stretching;strength training/weights (Pilates, balance exercises 2 days), Time (Minutes): 25, Frequency (Times/Week): 7, Weekly Exercise (Minutes/Week): 175, Intensity: Mild Healthy diet Good water intake   Goals Addressed             This Visit's Progress    Follow up with Primary Care Provider       As needed       Depression Screen PHQ 2/9 Scores 06/29/2021 04/20/2021 06/28/2020 04/19/2020 12/17/2017 07/20/2014 05/31/2014  PHQ - 2 Score 0 1 0 1 0 0 0  PHQ- 9 Score - - - 6 1 - -    Fall Risk Fall Risk  06/29/2021 05/09/2021 04/20/2021 06/28/2020 04/19/2020  Falls in the past year? 0 0 1 0 1  Number falls in past yr: - - 0 0 0  Injury with Fall? - - 0 0 0  Risk for fall due to : - - History of fall(s) - -  Follow up Falls evaluation completed - Falls evaluation completed Falls evaluation completed Falls evaluation completed    [place]: Home free of loose throw rugs in walkways, pet beds, electrical cords, etc? Yes  Adequate lighting in your home to reduce risk of falls? Yes   ASSISTIVE DEVICES UTILIZED TO PREVENT FALLS: Life alert? No  Use of a cane, walker or w/c? No  Grab bars in the bathroom? Yes  Shower chair or bench in shower? No  Elevated toilet seat or a handicapped toilet? Yes   TIMED UP AND GO: Was the test performed? No .   Cognitive Function:  Patient is alert.     Immunizations Immunization History  Administered Date(s) Administered   DTaP / Hep B / IPV 01/15/2018, 02/02/2019   HiB (PRP-T) 01/15/2018, 02/02/2019   Influenza Split 02/12/2011, 02/17/2012, 03/08/2014   Influenza Whole  05/14/2009   Influenza, High Dose Seasonal PF 02/17/2020, 02/02/2021   Influenza,inj,Quad PF,6+ Mos 01/28/2018, 02/02/2019   Influenza-Unspecified 03/13/2013,  01/22/2017, 01/28/2018   Janssen (J&J) SARS-COV-2 Vaccination 08/19/2019   MMR 12/22/2019   Moderna Sars-Covid-2 Vaccination 02/21/2020, 04/10/2021   Pneumococcal Conjugate-13 01/15/2018, 02/02/2019   Pneumococcal Polysaccharide-23 12/22/2019   Tdap 04/06/2014   Zoster Recombinat (Shingrix) 01/15/2018, 07/16/2018   Zoster, Live 07/18/2011   Screening Tests Health Maintenance  Topic Date Due   COVID-19 Vaccine (4 - Booster for Janssen series) 07/15/2021 (Originally 06/05/2021)   TETANUS/TDAP  04/06/2024   COLONOSCOPY (Pts 45-82yr Insurance coverage will need to be confirmed)  06/19/2024   Pneumonia Vaccine 67 Years old  Completed   INFLUENZA VACCINE  Completed   DEXA SCAN  Completed   Hepatitis C Screening  Completed   Zoster Vaccines- Shingrix  Completed   HPV VACCINES  Aged Out   Health Maintenance There are no preventive care reminders to display for this patient.  Lung Cancer Screening: (Low Dose CT Chest recommended if Age 67-80years, 30 pack-year currently smoking OR have quit w/in 15years.) does not qualify.   Vision Screening: Recommended annual ophthalmology exams for early detection of glaucoma and other disorders of the eye. I Dental Screening: Recommended annual dental exams for proper oral hygiene  Community Resource Referral / Chronic Care Management: CRR required this visit?  No   CCM required this visit?  No      Plan:   Keep all routine maintenance appointments.   I have personally reviewed and noted the following in the patients chart:   Medical and social history Use of alcohol, tobacco or illicit drugs  Current medications and supplements including opioid prescriptions.  Functional ability and status Nutritional status Physical activity Advanced directives List of other  physicians Hospitalizations, surgeries, and ER visits in previous 12 months Vitals Screenings to include cognitive, depression, and falls Referrals and appointments  In addition, I have reviewed and discussed with patient certain preventive protocols, quality metrics, and best practice recommendations. A written personalized care plan for preventive services as well as general preventive health recommendations were provided to patient via mychart.     OBrien-Blaney, Jaeanna Mccomber L, LPN   26/76/1951   I have reviewed the above information and agree with above.   TDeborra Medina MD

## 2021-06-29 NOTE — Patient Instructions (Addendum)
Stacy Murillo , Thank you for taking time to come for your Medicare Wellness Visit. I appreciate your ongoing commitment to your health goals. Please review the following plan we discussed and let me know if I can assist you in the future.   These are the goals we discussed:  Goals      Follow up with Primary Care Provider     As needed        This is a list of the screening recommended for you and due dates:  Health Maintenance  Topic Date Due   COVID-19 Vaccine (4 - Booster for Janssen series) 07/15/2021*   Tetanus Vaccine  04/06/2024   Colon Cancer Screening  06/19/2024   Pneumonia Vaccine  Completed   Flu Shot  Completed   DEXA scan (bone density measurement)  Completed   Hepatitis C Screening: USPSTF Recommendation to screen - Ages 50-79 yo.  Completed   Zoster (Shingles) Vaccine  Completed   HPV Vaccine  Aged Out  *Topic was postponed. The date shown is not the original due date.   Advanced directives: End of life planning; Advance aging; Advanced directives discussed.  Copy of current HCPOA/Living Will requested.    Conditions/risks identified: none new  Follow up in one year for your annual wellness visit    Preventive Care 65 Years and Older, Female Preventive care refers to lifestyle choices and visits with your health care provider that can promote health and wellness. What does preventive care include? A yearly physical exam. This is also called an annual well check. Dental exams once or twice a year. Routine eye exams. Ask your health care provider how often you should have your eyes checked. Personal lifestyle choices, including: Daily care of your teeth and gums. Regular physical activity. Eating a healthy diet. Avoiding tobacco and drug use. Limiting alcohol use. Practicing safe sex. Taking low-dose aspirin every day. Taking vitamin and mineral supplements as recommended by your health care provider. What happens during an annual well check? The  services and screenings done by your health care provider during your annual well check will depend on your age, overall health, lifestyle risk factors, and family history of disease. Counseling  Your health care provider may ask you questions about your: Alcohol use. Tobacco use. Drug use. Emotional well-being. Home and relationship well-being. Sexual activity. Eating habits. History of falls. Memory and ability to understand (cognition). Work and work Statistician. Reproductive health. Screening  You may have the following tests or measurements: Height, weight, and BMI. Blood pressure. Lipid and cholesterol levels. These may be checked every 5 years, or more frequently if you are over 76 years old. Skin check. Lung cancer screening. You may have this screening every year starting at age 69 if you have a 30-pack-year history of smoking and currently smoke or have quit within the past 15 years. Fecal occult blood test (FOBT) of the stool. You may have this test every year starting at age 57. Flexible sigmoidoscopy or colonoscopy. You may have a sigmoidoscopy every 5 years or a colonoscopy every 10 years starting at age 70. Hepatitis C blood test. Hepatitis B blood test. Sexually transmitted disease (STD) testing. Diabetes screening. This is done by checking your blood sugar (glucose) after you have not eaten for a while (fasting). You may have this done every 1-3 years. Bone density scan. This is done to screen for osteoporosis. You may have this done starting at age 41. Mammogram. This may be done every 1-2 years. Talk  to your health care provider about how often you should have regular mammograms. Talk with your health care provider about your test results, treatment options, and if necessary, the need for more tests. Vaccines  Your health care provider may recommend certain vaccines, such as: Influenza vaccine. This is recommended every year. Tetanus, diphtheria, and acellular  pertussis (Tdap, Td) vaccine. You may need a Td booster every 10 years. Zoster vaccine. You may need this after age 81. Pneumococcal 13-valent conjugate (PCV13) vaccine. One dose is recommended after age 11. Pneumococcal polysaccharide (PPSV23) vaccine. One dose is recommended after age 61. Talk to your health care provider about which screenings and vaccines you need and how often you need them. This information is not intended to replace advice given to you by your health care provider. Make sure you discuss any questions you have with your health care provider. Document Released: 05/27/2015 Document Revised: 01/18/2016 Document Reviewed: 03/01/2015 Elsevier Interactive Patient Education  2017 [place] Prevention in the Home Falls can cause injuries. They can happen to people of all ages. There are many things you can do to make your home safe and to help prevent falls. What can I do on the outside of my home? Regularly fix the edges of walkways and driveways and fix any cracks. Remove anything that might make you trip as you walk through a door, such as a raised step or threshold. Trim any bushes or trees on the path to your home. Use bright outdoor lighting. Clear any walking paths of anything that might make someone trip, such as rocks or tools. Regularly check to see if handrails are loose or broken. Make sure that both sides of any steps have handrails. Any raised decks and porches should have guardrails on the edges. Have any leaves, snow, or ice cleared regularly. Use sand or salt on walking paths during winter. Clean up any spills in your garage right away. This includes oil or grease spills. What can I do in the bathroom? Use night lights. Install grab bars by the toilet and in the tub and shower. Do not use towel bars as grab bars. Use non-skid mats or decals in the tub or shower. If you need to sit down in the shower, use a plastic, non-slip stool. Keep the floor  dry. Clean up any water that spills on the floor as soon as it happens. Remove soap buildup in the tub or shower regularly. Attach bath mats securely with double-sided non-slip rug tape. Do not have throw rugs and other things on the floor that can make you trip. What can I do in the bedroom? Use night lights. Make sure that you have a light by your bed that is easy to reach. Do not use any sheets or blankets that are too big for your bed. They should not hang down onto the floor. Have a firm chair that has side arms. You can use this for support while you get dressed. Do not have throw rugs and other things on the floor that can make you trip. What can I do in the kitchen? Clean up any spills right away. Avoid walking on wet floors. Keep items that you use a lot in easy-to-reach places. If you need to reach something above you, use a strong step stool that has a grab bar. Keep electrical cords out of the way. Do not use floor polish or wax that makes floors slippery. If you must use wax, use non-skid floor wax. Do  not have throw rugs and other things on the floor that can make you trip. What can I do with my stairs? Do not leave any items on the stairs. Make sure that there are handrails on both sides of the stairs and use them. Fix handrails that are broken or loose. Make sure that handrails are as long as the stairways. Check any carpeting to make sure that it is firmly attached to the stairs. Fix any carpet that is loose or worn. Avoid having throw rugs at the top or bottom of the stairs. If you do have throw rugs, attach them to the floor with carpet tape. Make sure that you have a light switch at the top of the stairs and the bottom of the stairs. If you do not have them, ask someone to add them for you. What else can I do to help prevent falls? Wear shoes that: Do not have high heels. Have rubber bottoms. Are comfortable and fit you well. Are closed at the toe. Do not wear  sandals. If you use a stepladder: Make sure that it is fully opened. Do not climb a closed stepladder. Make sure that both sides of the stepladder are locked into place. Ask someone to hold it for you, if possible. Clearly mark and make sure that you can see: Any grab bars or handrails. First and last steps. Where the edge of each step is. Use tools that help you move around (mobility aids) if they are needed. These include: Canes. Walkers. Scooters. Crutches. Turn on the lights when you go into a dark area. Replace any light bulbs as soon as they burn out. Set up your furniture so you have a clear path. Avoid moving your furniture around. If any of your floors are uneven, fix them. If there are any pets around you, be aware of where they are. Review your medicines with your doctor. Some medicines can make you feel dizzy. This can increase your chance of falling. Ask your doctor what other things that you can do to help prevent falls. This information is not intended to replace advice given to you by your health care provider. Make sure you discuss any questions you have with your health care provider. Document Released: 02/24/2009 Document Revised: 10/06/2015 Document Reviewed: 06/04/2014 Elsevier Interactive Patient Education  2017 [employer].

## 2021-07-18 ENCOUNTER — Other Ambulatory Visit: Payer: Self-pay

## 2021-07-18 ENCOUNTER — Ambulatory Visit
Admission: RE | Admit: 2021-07-18 | Discharge: 2021-07-18 | Disposition: A | Discharge status: Home / Self Care | Payer: Medicare Other | Source: Ambulatory Visit | Attending: Internal Medicine | Admitting: Internal Medicine

## 2021-07-18 DIAGNOSIS — C8599 Non-Hodgkin lymphoma, unspecified, extranodal and solid organ sites: Secondary | ICD-10-CM | POA: Diagnosis not present

## 2021-07-18 MED ORDER — GADOBUTROL 1 MMOL/ML IV SOLN
7.5000 mL | Freq: Once | INTRAVENOUS | Status: AC | PRN
  Administered 2021-07-18: 7.5 mL via INTRAVENOUS

## 2021-07-28 ENCOUNTER — Inpatient Hospital Stay: Payer: Medicare Other | Attending: Internal Medicine

## 2021-07-28 ENCOUNTER — Inpatient Hospital Stay (HOSPITAL_BASED_OUTPATIENT_CLINIC_OR_DEPARTMENT_OTHER): Payer: Medicare Other | Admitting: Internal Medicine

## 2021-07-28 ENCOUNTER — Other Ambulatory Visit: Payer: Self-pay

## 2021-07-28 ENCOUNTER — Encounter: Payer: Self-pay | Admitting: Internal Medicine

## 2021-07-28 VITALS — BP 118/76 | HR 73 | Temp 98.9°F | Resp 18 | Wt 185.6 lb

## 2021-07-28 DIAGNOSIS — R0981 Nasal congestion: Secondary | ICD-10-CM | POA: Insufficient documentation

## 2021-07-28 DIAGNOSIS — Z808 Family history of malignant neoplasm of other organs or systems: Secondary | ICD-10-CM | POA: Diagnosis not present

## 2021-07-28 DIAGNOSIS — Z836 Family history of other diseases of the respiratory system: Secondary | ICD-10-CM | POA: Insufficient documentation

## 2021-07-28 DIAGNOSIS — Z8249 Family history of ischemic heart disease and other diseases of the circulatory system: Secondary | ICD-10-CM | POA: Insufficient documentation

## 2021-07-28 DIAGNOSIS — Z79899 Other long term (current) drug therapy: Secondary | ICD-10-CM | POA: Insufficient documentation

## 2021-07-28 DIAGNOSIS — I951 Orthostatic hypotension: Secondary | ICD-10-CM | POA: Diagnosis not present

## 2021-07-28 DIAGNOSIS — R413 Other amnesia: Secondary | ICD-10-CM | POA: Diagnosis not present

## 2021-07-28 DIAGNOSIS — Z8719 Personal history of other diseases of the digestive system: Secondary | ICD-10-CM | POA: Diagnosis not present

## 2021-07-28 DIAGNOSIS — Z885 Allergy status to narcotic agent status: Secondary | ICD-10-CM | POA: Diagnosis not present

## 2021-07-28 DIAGNOSIS — C8599 Non-Hodgkin lymphoma, unspecified, extranodal and solid organ sites: Secondary | ICD-10-CM

## 2021-07-28 DIAGNOSIS — Z800 Family history of malignant neoplasm of digestive organs: Secondary | ICD-10-CM | POA: Insufficient documentation

## 2021-07-28 DIAGNOSIS — C8338 Diffuse large B-cell lymphoma, lymph nodes of multiple sites: Secondary | ICD-10-CM | POA: Diagnosis present

## 2021-07-28 DIAGNOSIS — R42 Dizziness and giddiness: Secondary | ICD-10-CM | POA: Insufficient documentation

## 2021-07-28 DIAGNOSIS — R5383 Other fatigue: Secondary | ICD-10-CM | POA: Diagnosis not present

## 2021-07-28 DIAGNOSIS — C8583 Other specified types of non-Hodgkin lymphoma, intra-abdominal lymph nodes: Secondary | ICD-10-CM

## 2021-07-28 DIAGNOSIS — Z9049 Acquired absence of other specified parts of digestive tract: Secondary | ICD-10-CM | POA: Insufficient documentation

## 2021-07-28 LAB — CBC WITH DIFFERENTIAL/PLATELET
Abs Immature Granulocytes: 0.03 10*3/uL (ref 0.00–0.07)
Basophils Absolute: 0 10*3/uL (ref 0.0–0.1)
Basophils Relative: 1 %
Eosinophils Absolute: 0.1 10*3/uL (ref 0.0–0.5)
Eosinophils Relative: 1 %
HCT: 35.8 % — ABNORMAL LOW (ref 36.0–46.0)
Hemoglobin: 12.2 g/dL (ref 12.0–15.0)
Immature Granulocytes: 1 %
Lymphocytes Relative: 13 %
Lymphs Abs: 0.9 10*3/uL (ref 0.7–4.0)
MCH: 31.3 pg (ref 26.0–34.0)
MCHC: 34.1 g/dL (ref 30.0–36.0)
MCV: 91.8 fL (ref 80.0–100.0)
Monocytes Absolute: 0.6 10*3/uL (ref 0.1–1.0)
Monocytes Relative: 9 %
Neutro Abs: 5 10*3/uL (ref 1.7–7.7)
Neutrophils Relative %: 75 %
Platelets: 145 10*3/uL — ABNORMAL LOW (ref 150–400)
RBC: 3.9 MIL/uL (ref 3.87–5.11)
RDW: 12.5 % (ref 11.5–15.5)
WBC: 6.6 10*3/uL (ref 4.0–10.5)
nRBC: 0 % (ref 0.0–0.2)

## 2021-07-28 LAB — COMPREHENSIVE METABOLIC PANEL
ALT: 14 U/L (ref 0–44)
AST: 20 U/L (ref 15–41)
Albumin: 4 g/dL (ref 3.5–5.0)
Alkaline Phosphatase: 66 U/L (ref 38–126)
Anion gap: 9 (ref 5–15)
BUN: 15 mg/dL (ref 8–23)
CO2: 26 mmol/L (ref 22–32)
Calcium: 9.2 mg/dL (ref 8.9–10.3)
Chloride: 102 mmol/L (ref 98–111)
Creatinine, Ser: 1.1 mg/dL — ABNORMAL HIGH (ref 0.44–1.00)
GFR, Estimated: 55 mL/min — ABNORMAL LOW (ref >60)
Glucose, Bld: 120 mg/dL — ABNORMAL HIGH (ref 70–99)
Potassium: 3.7 mmol/L (ref 3.5–5.1)
Sodium: 137 mmol/L (ref 135–145)
Total Bilirubin: 0.3 mg/dL (ref 0.3–1.2)
Total Protein: 6.1 g/dL — ABNORMAL LOW (ref 6.5–8.1)

## 2021-07-28 LAB — MAGNESIUM: Magnesium: 2.2 mg/dL (ref 1.7–2.4)

## 2021-07-28 LAB — LACTATE DEHYDROGENASE: LDH: 116 U/L (ref 98–192)

## 2021-07-28 MED ORDER — HEPARIN SOD (PORK) LOCK FLUSH 100 UNIT/ML IV SOLN
500.0000 [IU] | Freq: Once | INTRAVENOUS | Status: AC
Start: 2021-07-28 — End: 2021-07-28
  Administered 2021-07-28: 500 [IU] via INTRAVENOUS
  Filled 2021-07-28: qty 5

## 2021-07-28 MED ORDER — SODIUM CHLORIDE 0.9% FLUSH
10.0000 mL | Freq: Once | INTRAVENOUS | Status: AC
Start: 2021-07-28 — End: 2021-07-28
  Administered 2021-07-28: 10 mL via INTRAVENOUS
  Filled 2021-07-28: qty 10

## 2021-07-28 NOTE — Patient Instructions (Signed)
#  Recommend Slow-Mag-1 pill twice a day.  ?

## 2021-07-28 NOTE — Progress Notes (Signed)
Patient here for follow up. Pt reports that she has been having headaches in the mornings and believes that it is due to sinuses.This just started about a week ago and pt wanting to know if there is a decongestant that she can take that will not interact with midodrine. Pt also reports lower and upper extremity cramps.  ?

## 2021-07-28 NOTE — Assessment & Plan Note (Addendum)
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 S/p WBRT; MRI - 07/18/2021- No evidence for residual or recurrent lymphoma; Stable atrophy and diffuse white matter disease consistent with prior whole brain radiation;  No acute intracranial abnormality or significant interval change. ? ?# Sinuses congestion- recommend anti-histamines; if not improved recommend evaluation with ENT. ? ?# Bil UE/LE- cramping- ? etilogy-check mag; recommend Slow-Mag. ? ??#Gait instability/orthostatic hypotension-s/p physical therapy; continue midodrine; stable. ? ?# Neurocognitive issues/lack of motivation/extreme fatigue-multifactorial [malignancy; chemotherapy radiation] -overall clinically stable.  Declines evaluation with Dr.vaslow.  ? ?# Mediport-  Flushing;but not drawing blood.  Declines dye study; discussed regarding explantation.  Continue port flush for now. ? ?# DISPOSITION: ?# Add Mag today ?# port flush in 2 months; 4 month ?#Follow up in 6  months MD; labs- cbc/cmp/ldh; mag; ; port flush [double lumen]-MRI brain Dr.B ? ? ? ? ? ?

## 2021-07-28 NOTE — Progress Notes (Signed)
[place] ?OFFICE PROGRESS NOTE ? ?Patient Care Team: ?Crecencio Mc, MD as PCP - General (Internal Medicine) ?Cammie Sickle, MD as Consulting Physician (Internal Medicine) ?Christene Lye, MD (General Surgery) ?[name], MD as Consulting Physician (Cardiology) ? ? Cancer Staging  ?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[place]) ?Staging form: Lymphoid Neoplasms, AJCC 6th Edition ?- Clinical stage from 09/13/2015: Stage IV - Signed by Cammie Sickle, MD on 10/06/2015 ?Specimen type: Core Needle Biopsy ? ? ? ?Oncology History Overview Note  ?#MAY 2017- 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 CT- 1-2 CM LN subpectoral/medistinal/ retro-peritoneal/pelvic/ Right inguinal LN 1.6cm- Bx- DLBCL- ABC; myc-POS; FISH gene re-arragement-NEG. PET- MULTIPLE LN/ Bone involvement; R-CHOP x6- CR'# OCT 2017- PET scan- CR; DEC 22nd 2017- BMBx- "atypical large B cells- <1%  [[facility]; II opinion- Dr.Grover- review of path- not concerning for residual lymphoma]; recommend surviellance ? ?# March 26th  2018PET- NED ? ?# Lumbar puncture- difficult-spinal headache. S/p high dose MXT x4.  ?------------------------------------------------------------------- ?# NOV 2018-recurrent diffuse large B-cell lymphoma [right axilla lymph node biopsy proven]; PET- multiple bone lesions; axillary adenopathy; splenomegaly uptake ? 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  ? ?# OCT 2020- MASS  spanning the corpus callosum. Brain Biopsy  at [facility]-DLBCL. CD20+. EBV -, Ki67+, Pax5+. Port placed 10/27, R-MPV started 10/27 [facility]; Dr.Grover]. s/p WBRT ; MARCH 31st- MRI- remission/Stable disease   ?---------------------------------------------------------- ?# June 2017-Elevated LFTs- valacylovir/diflucan; resolved.  ? 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  ? ?# May 2017- EF- 55-65% ? ?#August 2019-double vision; secondary to refractive error-prism improved. ?--------------------------------------------------  ? ?DIAGNOSIS: Diffuse large B-cell lymphoma of Brain ? ?STAGE: 4       ;GOALS: Palliative ? ?CURRENT/MOST RECENT THERAPY-R-MPV ? ?  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  ? ? ?INTERVAL HISTORY: ? ?Stacy Murillo 66 y.o.  female pleasant patient above history of relapsed diffuse large B-cell lymphoma in the brain-currently s/p R-methotrexate procarbazine/steroid; also status post consolidation radiation currently on surveillance is here for follow-up/review results of the brain MRI. ? ?Patient here for follow up. Pt reports that she has been having headaches in the mornings and believes that it is due to sinuses.This just started about a week ago and pt wanting to know if there is a decongestant that she can take that will not interact with midodrine. Pt also reports lower and upper extremity cramps.  ? ?Patient denies any recent hospitalizations.  Patient has weaned herself off gabapentin/has minimal postherpetic neuralgia. ? ?Patient continues to have difficulty with memory/executive functions.  Otherwise no headaches no nausea no vomiting. ? ? ?Review of Systems  ?Constitutional:  Positive for malaise/fatigue. Negative for chills, diaphoresis, fever and weight loss.  ?HENT:  Negative for nosebleeds and sore throat.   ?Eyes:  Negative for double vision.  ?Respiratory:  Negative for cough, hemoptysis, sputum production and wheezing.   ?Cardiovascular:  Negative for chest pain, orthopnea and leg  swelling.  ?Gastrointestinal:  Negative for abdominal pain, blood in stool, constipation, diarrhea,  heartburn, melena, nausea and vomiting.  ?Genitourinary:  Negative for dysuria, frequency and urgency.  ?Musculoskeletal:  Negative for back pain and joint pain.  ?Skin: Negative.  Negative for itching and rash.  ?Neurological:  Positive for dizziness. Negative for tingling, focal weakness and weakness.  ?Endo/Heme/Allergies:  Does not bruise/bleed easily.  ?Psychiatric/Behavioral:  Positive for memory loss. Negative for depression. The patient does not have insomnia.   ?  ? ?PAST MEDICAL HISTORY :  ?Past Medical History:  ?Diagnosis Date  ?? Arthritis   ?? Cancer [facility])   ?? Diverticulosis 07/06/15  ?? Dysrhythmia   ? tachycardia  ?? Esophagitis 07/06/15  ?? Fatigue   ?? Gastritis 07/06/15  ?? GERD (gastroesophageal reflux disease)   ?? 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 Leg cramps   ?? Lymphoma ([place]) 09/14/15  ? Monoclonal B cell lymphoma  ?? Night sweats   ?? Osteoporosis   ?? Overweight(278.02)   ? Obesity  ?? Palpitations   ?? Shortness of breath dyspnea   ?? Tachycardia   ?? Thrombocytopenia ([place])   ? ? ?PAST SURGICAL HISTORY :   ?Past Surgical History:  ?Procedure Laterality Date  ?? ABDOMINAL HYSTERECTOMY  2005  ?? BONE MARROW BIOPSY  09/14/15  ?? CHOLECYSTECTOMY  1997  ?? COLONOSCOPY  07/05/2014  ?? ESOPHAGOGASTRODUODENOSCOPY  07/05/2014  ?? INGUINAL LYMPH NODE BIOPSY Right 10/05/2015  ? Procedure: INGUINAL LYMPH NODE BIOPSY;  Surgeon: Christene Lye, MD;  Location: [facility] ORS;  Service: General;  Laterality: Right;  ?? PERIPHERAL VASCULAR CATHETERIZATION N/A 09/27/2015  ? Procedure: Porta Cath Insertion;  Surgeon: Algernon Huxley, MD;  Location: [facility];  Service: Cardiovascular;  Laterality: N/A;  ?? PORTA CATH REMOVAL Right 06/03/2017  ? Procedure: PORTA CATH REMOVAL, with venous thrombectomy;  Surgeon: Algernon Huxley, MD;  Location: [facility];   Service: Cardiovascular;  Laterality: Right;  ?? PORTACATH PLACEMENT Right   ? ? ?FAMILY HISTORY :   ?Family History  ?Problem Relation Age of Onset  ?? Heart disease Father   ?     CABG x 4  ?? Multiple myeloma Father   ?? Hypertension Mother   ?? Pancreatic cancer Mother   ?? Ulcers Mother   ?? Asthma Mother   ?? Brain cancer Maternal Uncle   ?? Multiple myeloma Maternal Uncle   ?? Diabetes Neg Hx   ?? Breast cancer Neg Hx   ? ? ?SOCIAL HISTORY:   ?Social History  ? ?Tobacco Use  ?? Smoking status: Never  ?? Smokeless tobacco: Never  ?Vaping Use  ?? Vaping Use: Never used  ?Substance Use Topics  ?? Alcohol use: No  ?  Alcohol/week: 0.0 standard drinks  ?? Drug use: No  ? ? ?ALLERGIES:  is allergic to ondansetron hcl, oxycodone, and rituximab. ? ?MEDICATIONS:  ?Current Outpatient Medications  ?Medication Sig Dispense Refill  ?? acetaminophen (TYLENOL) 650 MG CR tablet Take 650 mg by mouth every 8 (eight) hours as needed for pain.    ?? ALPRAZolam (XANAX) 0.25 MG tablet TAKE ONE TABLET AT BEDTIME AS NEEDED FORANXIETY 20 tablet 2  ?? midodrine (PROAMATINE) 5 MG tablet Take 1.5 tablets by mouth 3 (three) times daily.     ?? ondansetron (ZOFRAN-ODT) 4 MG disintegrating tablet Take 1 tablet (4 mg total) by mouth every 8 (eight) hours as needed for nausea or vomiting. 60 tablet 2  ? ?No current facility-administered medications for this visit.  ? ?Facility-Administered Medications  Ordered in Other Visits  ?Medication Dose Route Frequency Provider Last Rate Last Admin  ?? 0.9 %  sodium chloride infusion   Intravenous Continuous Levora Dredge Orville Govern, NP   Stopped at 10/14/15 1312  ?? methotrexate (50 mg/ml) 6.3 g in sodium chloride 0.9 % 1,000 mL injection   Intravenous Once Cammie Sickle, MD      ?? sodium chloride flush (NS) 0.9 % injection 10 mL  10 mL Intravenous PRN Cammie Sickle, MD   10 mL at 10/05/16 1400  ? ? ?PHYSICAL EXAMINATION: ?ECOG PERFORMANCE STATUS: 0 - Asymptomatic ? ?BP 118/76 (BP Location:  Right Arm, Patient Position: Sitting)   Pulse 73   Temp 98.9 ?F (37.2 ?C)   Resp 18   Wt 185 lb 9.6 oz (84.2 kg)   BMI 32.88 kg/m?  ? ?Filed Weights  ? 07/28/21 1057  ?Weight: 185 lb 9.6 oz (84.2 kg)  ? ?Physical E

## 2021-09-27 ENCOUNTER — Inpatient Hospital Stay: Payer: Medicare Other | Attending: Internal Medicine

## 2021-09-27 DIAGNOSIS — Z95828 Presence of other vascular implants and grafts: Secondary | ICD-10-CM

## 2021-09-27 DIAGNOSIS — Z8572 Personal history of non-Hodgkin lymphomas: Secondary | ICD-10-CM | POA: Insufficient documentation

## 2021-09-27 DIAGNOSIS — Z452 Encounter for adjustment and management of vascular access device: Secondary | ICD-10-CM | POA: Diagnosis present

## 2021-09-27 MED ORDER — HEPARIN SOD (PORK) LOCK FLUSH 100 UNIT/ML IV SOLN
500.0000 [IU] | Freq: Once | INTRAVENOUS | Status: AC
Start: 2021-09-27 — End: 2021-09-27
  Administered 2021-09-27: 500 [IU] via INTRAVENOUS
  Filled 2021-09-27: qty 5

## 2021-09-27 MED ORDER — SODIUM CHLORIDE 0.9% FLUSH
10.0000 mL | Freq: Once | INTRAVENOUS | Status: AC
Start: 2021-09-27 — End: 2021-09-27
  Administered 2021-09-27: 10 mL via INTRAVENOUS
  Filled 2021-09-27: qty 10

## 2021-11-27 ENCOUNTER — Inpatient Hospital Stay: Payer: Medicare Other | Attending: Internal Medicine

## 2021-11-27 DIAGNOSIS — Z95828 Presence of other vascular implants and grafts: Secondary | ICD-10-CM

## 2021-11-27 DIAGNOSIS — Z452 Encounter for adjustment and management of vascular access device: Secondary | ICD-10-CM | POA: Diagnosis present

## 2021-11-27 DIAGNOSIS — Z8572 Personal history of non-Hodgkin lymphomas: Secondary | ICD-10-CM | POA: Diagnosis present

## 2021-11-27 MED ORDER — HEPARIN SOD (PORK) LOCK FLUSH 100 UNIT/ML IV SOLN
500.0000 [IU] | Freq: Once | INTRAVENOUS | Status: AC
Start: 2021-11-27 — End: 2021-11-27
  Administered 2021-11-27: 500 [IU] via INTRAVENOUS
  Filled 2021-11-27: qty 5

## 2021-11-27 MED ORDER — SODIUM CHLORIDE 0.9% FLUSH
10.0000 mL | INTRAVENOUS | Status: DC | PRN
  Administered 2021-11-27: 10 mL via INTRAVENOUS
  Filled 2021-11-27: qty 10

## 2021-11-27 MED ORDER — HEPARIN SOD (PORK) LOCK FLUSH 100 UNIT/ML IV SOLN
500.0000 [IU] | Freq: Once | INTRAVENOUS | Status: AC
  Administered 2021-11-27: 500 [IU] via INTRAVENOUS
  Filled 2021-11-27: qty 5

## 2021-12-05 ENCOUNTER — Other Ambulatory Visit: Payer: Self-pay | Admitting: Internal Medicine

## 2021-12-29 ENCOUNTER — Other Ambulatory Visit: Payer: Self-pay | Admitting: Internal Medicine

## 2022-01-25 ENCOUNTER — Ambulatory Visit
Admission: RE | Admit: 2022-01-25 | Discharge: 2022-01-25 | Disposition: A | Discharge status: Home / Self Care | Payer: Medicare Other | Source: Ambulatory Visit | Attending: Internal Medicine | Admitting: Internal Medicine

## 2022-01-25 DIAGNOSIS — C8599 Non-Hodgkin lymphoma, unspecified, extranodal and solid organ sites: Secondary | ICD-10-CM | POA: Diagnosis present

## 2022-01-25 MED ORDER — GADOBUTROL 1 MMOL/ML IV SOLN
8.0000 mL | Freq: Once | INTRAVENOUS | Status: AC | PRN
  Administered 2022-01-25: 8 mL via INTRAVENOUS

## 2022-01-29 ENCOUNTER — Other Ambulatory Visit: Payer: Medicare Other

## 2022-01-29 ENCOUNTER — Inpatient Hospital Stay (HOSPITAL_BASED_OUTPATIENT_CLINIC_OR_DEPARTMENT_OTHER): Payer: Medicare Other | Admitting: Internal Medicine

## 2022-01-29 ENCOUNTER — Ambulatory Visit: Payer: Medicare Other | Admitting: Internal Medicine

## 2022-01-29 ENCOUNTER — Inpatient Hospital Stay: Payer: Medicare Other | Attending: Internal Medicine

## 2022-01-29 ENCOUNTER — Encounter: Payer: Self-pay | Admitting: Internal Medicine

## 2022-01-29 VITALS — BP 133/72 | HR 69 | Temp 99.0°F | Resp 16 | Wt 195.2 lb

## 2022-01-29 DIAGNOSIS — C8599 Non-Hodgkin lymphoma, unspecified, extranodal and solid organ sites: Secondary | ICD-10-CM

## 2022-01-29 DIAGNOSIS — C8288 Other types of follicular lymphoma, lymph nodes of multiple sites: Secondary | ICD-10-CM | POA: Diagnosis not present

## 2022-01-29 DIAGNOSIS — G3184 Mild cognitive impairment, so stated: Secondary | ICD-10-CM

## 2022-01-29 DIAGNOSIS — Z95828 Presence of other vascular implants and grafts: Secondary | ICD-10-CM

## 2022-01-29 DIAGNOSIS — C8333 Diffuse large B-cell lymphoma, intra-abdominal lymph nodes: Secondary | ICD-10-CM | POA: Diagnosis not present

## 2022-01-29 DIAGNOSIS — C8583 Other specified types of non-Hodgkin lymphoma, intra-abdominal lymph nodes: Secondary | ICD-10-CM

## 2022-01-29 LAB — COMPREHENSIVE METABOLIC PANEL
ALT: 13 U/L (ref 0–44)
AST: 23 U/L (ref 15–41)
Albumin: 4.2 g/dL (ref 3.5–5.0)
Alkaline Phosphatase: 66 U/L (ref 38–126)
Anion gap: 8 (ref 5–15)
BUN: 13 mg/dL (ref 8–23)
CO2: 23 mmol/L (ref 22–32)
Calcium: 9.1 mg/dL (ref 8.9–10.3)
Chloride: 106 mmol/L (ref 98–111)
Creatinine, Ser: 1.18 mg/dL — ABNORMAL HIGH (ref 0.44–1.00)
GFR, Estimated: 51 mL/min — ABNORMAL LOW (ref >60)
Glucose, Bld: 140 mg/dL — ABNORMAL HIGH (ref 70–99)
Potassium: 3.4 mmol/L — ABNORMAL LOW (ref 3.5–5.1)
Sodium: 137 mmol/L (ref 135–145)
Total Bilirubin: 0.6 mg/dL (ref 0.3–1.2)
Total Protein: 6.7 g/dL (ref 6.5–8.1)

## 2022-01-29 LAB — CBC WITH DIFFERENTIAL/PLATELET
Abs Immature Granulocytes: 0.03 10*3/uL (ref 0.00–0.07)
Basophils Absolute: 0.1 10*3/uL (ref 0.0–0.1)
Basophils Relative: 1 %
Eosinophils Absolute: 0.1 10*3/uL (ref 0.0–0.5)
Eosinophils Relative: 2 %
HCT: 39.9 % (ref 36.0–46.0)
Hemoglobin: 13.7 g/dL (ref 12.0–15.0)
Immature Granulocytes: 1 %
Lymphocytes Relative: 16 %
Lymphs Abs: 1 10*3/uL (ref 0.7–4.0)
MCH: 30.3 pg (ref 26.0–34.0)
MCHC: 34.3 g/dL (ref 30.0–36.0)
MCV: 88.3 fL (ref 80.0–100.0)
Monocytes Absolute: 0.4 10*3/uL (ref 0.1–1.0)
Monocytes Relative: 7 %
Neutro Abs: 4.9 10*3/uL (ref 1.7–7.7)
Neutrophils Relative %: 73 %
Platelets: 161 10*3/uL (ref 150–400)
RBC: 4.52 MIL/uL (ref 3.87–5.11)
RDW: 12.4 % (ref 11.5–15.5)
WBC: 6.5 10*3/uL (ref 4.0–10.5)
nRBC: 0 % (ref 0.0–0.2)

## 2022-01-29 LAB — MAGNESIUM: Magnesium: 2 mg/dL (ref 1.7–2.4)

## 2022-01-29 LAB — LACTATE DEHYDROGENASE: LDH: 123 U/L (ref 98–192)

## 2022-01-29 MED ORDER — SODIUM CHLORIDE 0.9% FLUSH
10.0000 mL | Freq: Once | INTRAVENOUS | Status: AC
  Administered 2022-01-29: 10 mL via INTRAVENOUS
  Filled 2022-01-29: qty 10

## 2022-01-29 MED ORDER — HEPARIN SOD (PORK) LOCK FLUSH 100 UNIT/ML IV SOLN
500.0000 [IU] | Freq: Once | INTRAVENOUS | Status: AC
  Administered 2022-01-29: 500 [IU] via INTRAVENOUS
  Filled 2022-01-29: qty 5

## 2022-01-29 NOTE — Progress Notes (Signed)
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 CT- 1-2 CM LN subpectoral/medistinal/ retro-peritoneal/pelvic/ Right inguinal LN 1.6cm- Bx- DLBCL- ABC; myc-POS; FISH gene re-arragement-NEG. PET- MULTIPLE LN/ Bone involvement; R-CHOP x6- CR'# OCT 2017- PET scan- CR; DEC 22nd 2017- BMBx- "atypical large B cells- <1%  [[facility]; II opinion- Dr.Grover- review of path- not concerning for residual lymphoma]; recommend surviellance  # March 26th  2018PET- NED  # Lumbar puncture- difficult-spinal headache. S/p high dose MXT x4.  ------------------------------------------------------------------- # NOV 2018-recurrent diffuse large B-cell lymphoma [right axilla lymph node biopsy proven]; PET- multiple bone lesions; axillary adenopathy; splenomegaly uptake  # Nov 16th 2018- R-; s/p 3 cycles-CR' Auto-Stem cell Transplant date: 07/11/17 [facility]; Dr.Vincent]; May 1st 2019- PET CR.   # OCT 2020- MASS  spanning the corpus callosum. Brain Biopsy  at [facility]-DLBCL. CD20+. EBV -, Ki67+, Pax5+. Port placed 10/27, R-MPV started 10/27 [facility]; Dr.Grover]. s/p WBRT ; MARCH 31st- MRI- remission/Stable disease   ---------------------------------------------------------- # June 2017-Elevated LFTs- valacylovir/diflucan; resolved.   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   # May 2017- EF- 55-65%  #August 2019-double vision; secondary to refractive error-prism improved. 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  Patient continues to have intermittent cramps in her hands and legs.  Started in 2021 after her cancer treatment.  Denies any worsening.   Denies any tingling or numbness. Denies headaches, nausea or vomiting or dizziness. Patient denies fever, chills, nausea, vomiting, shortness of breath, cough, abdominal pain, bleeding, bowel or bladder issues. Appetite is good.  Denies any weight loss. Denies pain.  Patient continues to have difficulty with memory/executive functions.   Review of Systems  Constitutional:  Positive for malaise/fatigue. Negative for chills, diaphoresis, fever and weight loss.  HENT:  Negative for nosebleeds and sore throat.   Eyes:  Negative for double vision.  Respiratory:  Negative for cough, hemoptysis, sputum production and wheezing.   Cardiovascular:  Negative for chest pain, orthopnea and leg swelling.  Gastrointestinal:  Negative for abdominal pain, blood in stool, constipation, diarrhea, heartburn, melena,  nausea and vomiting.  Genitourinary:  Negative for dysuria, frequency and urgency.  Musculoskeletal:  Negative for back pain and joint pain.  Hand and leg cramps  Skin: Negative.  Negative for itching and rash.  Neurological:  Negative for tingling, focal weakness and weakness.  Endo/Heme/Allergies:  Does not bruise/bleed easily.  Psychiatric/Behavioral:  Positive for memory loss. Negative for depression. The patient does not have insomnia.       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   Leg cramps    Lymphoma ([place]) 09/14/15   Monoclonal B cell lymphoma   Night sweats    Osteoporosis    Overweight(278.02)    Obesity   Palpitations    Shortness of breath dyspnea    Tachycardia    Thrombocytopenia (HCC)     PAST SURGICAL HISTORY :   Past Surgical History:  Procedure Laterality Date   ABDOMINAL HYSTERECTOMY  2005   BONE MARROW BIOPSY  09/14/15   CHOLECYSTECTOMY  1997   COLONOSCOPY  07/05/2014   ESOPHAGOGASTRODUODENOSCOPY  07/05/2014   INGUINAL LYMPH NODE BIOPSY Right 10/05/2015   Procedure: INGUINAL LYMPH NODE BIOPSY;  Surgeon: Christene Lye, MD;  Location: [facility] ORS;  Service: General;  Laterality: Right;   PERIPHERAL VASCULAR CATHETERIZATION N/A 09/27/2015   Procedure: Glori Luis Cath Insertion;  Surgeon: Algernon Huxley, MD;  Location: [place] CV LAB;  Service: Cardiovascular;  Laterality: N/A;   PORTA CATH REMOVAL Right 06/03/2017   Procedure: PORTA CATH REMOVAL, with venous thrombectomy;  Surgeon: Algernon Huxley, MD;  Location: [place] CV LAB;  Service: Cardiovascular;  Laterality: Right;   PORTACATH PLACEMENT Right     FAMILY HISTORY :   Family History  Problem Relation Age of  Onset   Heart disease Father        CABG x 4   Multiple myeloma Father    Hypertension Mother    Pancreatic cancer Mother    Ulcers Mother    Asthma Mother    Brain cancer Maternal Uncle    Multiple myeloma Maternal Uncle    Diabetes Neg Hx    Breast cancer Neg Hx     SOCIAL HISTORY:   Social History   Tobacco Use   Smoking status: Never   Smokeless tobacco: Never  Vaping Use   Vaping Use: Never used  Substance Use Topics   Alcohol use: No    Alcohol/week: 0.0 standard drinks of alcohol   Drug use: No    ALLERGIES:  is allergic to ondansetron hcl, oxycodone, and rituximab.  MEDICATIONS:  Current Outpatient Medications  Medication Sig Dispense Refill   acetaminophen (TYLENOL) 650 MG CR tablet Take 650 mg by mouth every 8 (eight) hours as needed for pain.     ALPRAZolam (XANAX) 0.25 MG tablet TAKE ONE TABLET AT BEDTIME AS NEEDED FORANXIETY 20 tablet 2   midodrine (PROAMATINE) 5 MG tablet TAKE ONE AND ONE-HALF TABLETS THREE TIMES A DAY 180 tablet 1   ondansetron (ZOFRAN-ODT) 4 MG disintegrating tablet Take 1 tablet (4 mg total) by mouth every 8 (eight) hours as needed for nausea or vomiting. 60 tablet 2   No current facility-administered medications for this visit.   Facility-Administered Medications Ordered in Other Visits  Medication Dose Route Frequency Provider Last Rate Last Admin   0.9 %  sodium  chloride infusion   Intravenous Continuous Evlyn Kanner, NP   Stopped at 10/14/15 1312   methotrexate (50 mg/ml) 6.3 g in sodium chloride 0.9 % 1,000 mL injection   Intravenous Once Charlaine Dalton R, MD       sodium chloride flush (NS) 0.9 % injection 10 mL  10 mL Intravenous PRN Cammie Sickle, MD   10 mL at 10/05/16 1400    PHYSICAL EXAMINATION: ECOG PERFORMANCE STATUS: 0 - Asymptomatic  BP 133/72 (BP Location: Left Arm, Patient Position: Sitting)   Pulse 69   Temp 99 F (37.2 C) (Tympanic)   Resp 16   Wt 195 lb 3.2 oz (88.5 kg)   BMI 34.58 kg/m    Filed Weights   01/29/22 1000  Weight: 195 lb 3.2 oz (88.5 kg)   Physical Exam Constitutional:      Comments: Ambulating independently.  Accompanied by husband.    HENT:     Head: Normocephalic and atraumatic.     Mouth/Throat:     Pharynx: No oropharyngeal exudate.  Eyes:     Pupils: Pupils are equal, round, and reactive to light.  Cardiovascular:     Rate and Rhythm: Normal rate and regular rhythm.  Pulmonary:     Effort: Pulmonary effort is normal. No respiratory distress.     Breath sounds: Normal breath sounds. No wheezing.  Abdominal:     General: Bowel sounds are normal. There is no distension.     Palpations: Abdomen is soft. There is no mass.     Tenderness: There is no abdominal tenderness. There is no guarding or rebound.  Musculoskeletal:        General: No tenderness. Normal range of motion.     Cervical back: Normal range of motion and neck supple.  Skin:    General: Skin is warm.  Neurological:     Mental Status: She is alert and oriented to person, place, and time.  Psychiatric:        Mood and Affect: Affect normal.     LABORATORY DATA:  I have reviewed the data as listed    Component Value Date/Time   NA 137 01/29/2022 0921   NA 138 08/12/2019 0000   K 3.4 (L) 01/29/2022 0921   CL 106 01/29/2022 0921   CO2 23 01/29/2022 0921   GLUCOSE 140 (H) 01/29/2022 0921   BUN 13 01/29/2022 0921   BUN 8 08/12/2019 0000   CREATININE 1.18 (H) 01/29/2022 0921   CALCIUM 9.1 01/29/2022 0921   PROT 6.7 01/29/2022 0921   ALBUMIN 4.2 01/29/2022 0921   AST 23 01/29/2022 0921   ALT 13 01/29/2022 0921   ALKPHOS 66 01/29/2022 0921   BILITOT 0.6 01/29/2022 0921   GFRNONAA 51 (L) 01/29/2022 0921   GFRAA >60 11/24/2019 1442    No results found for: "SPEP", "UPEP"  Lab Results  Component Value Date   WBC 6.5 01/29/2022   NEUTROABS 4.9 01/29/2022   HGB 13.7 01/29/2022   HCT 39.9 01/29/2022   MCV 88.3 01/29/2022   PLT 161 01/29/2022      Chemistry       Component Value Date/Time   NA 137 01/29/2022 0921   NA 138 08/12/2019 0000   K 3.4 (L) 01/29/2022 0921   CL 106 01/29/2022 0921   CO2 23 01/29/2022 0921   BUN 13 01/29/2022 0921   BUN 8 08/12/2019 0000   CREATININE 1.18 (H) 01/29/2022 0921   GLU 88 08/12/2019 0000  Component Value Date/Time   CALCIUM 9.1 01/29/2022 0921   ALKPHOS 66 01/29/2022 0921   AST 23 01/29/2022 0921   ALT 13 01/29/2022 0921   BILITOT 0.6 01/29/2022 0921       RADIOGRAPHIC STUDIES: I have personally reviewed the radiological images as listed and agreed with the findings in the report. MRI brain w and wo contrast 01/25/2022 FINDINGS: Brain: No acute infarction, hydrocephalus, extra-axial collection or mass lesion. Unchanged focal area susceptibility artifact in the left frontal lobe, as well as linear susceptibility artifact along the course of the presumed biopsy tract in the left frontal lobe (series 13, image [id number]). Redemonstrated confluent T2/FLAIR hyperintense signal abnormality involving much of the supratentorial subcortical and periventricular white matter, including the genu and splenium of the corpus callosum with sparing of the bilateral temporal lobes. These findings are favored to represent posttreatment change. No new areas of T2/FLAIR hyperintense signal abnormality are visualized. No evidence of new contrast enhancing lesions. No hydrocephalus. Slightly advanced volume loss for age.   Vascular: Normal flow voids.   Skull and upper cervical spine: Normal marrow signal.   Sinuses/Orbits: Negative.   Other: None.   IMPRESSION: No evidence of residual recurrent lymphoma.    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 S/p WBRT; MRI - 01/25/2022- No evidence for residual or recurrent lymphoma; Stable atrophy and diffuse white matter disease consistent with  prior whole brain radiation;  No acute intracranial abnormality or significant interval change. Repeat MRI brain with and without contrast in 67-month.   # Bil UE/LE- cramping- ? Etiology.  Reports has been going on since 2021 after she completed her lymphoma treatment.  Magnesium is normal.  Potassium is mildly low at 3.4.  Discussed about adding foods in the diet rich in potassium.    #Gait instability/orthostatic hypotension-s/p physical therapy; continue midodrine; stable.   # Neurocognitive issues/lack of motivation/extreme fatigue-multifactorial [malignancy; chemotherapy radiation] -overall clinically stable.  Declines evaluation with Dr.vaslow.    # Mediport-not flushing or drawing blood.  Patient is interested in taking it out.  She will reach out to [facility] next week and then place the order.   # DISPOSITION: #Follow up in 6  months MD; labs- cbc/cmp/ldh; MRI brain Dr.B   Orders Placed This Encounter  Procedures   MR Brain W Wo Contrast    Standing Status:   Future    Standing Expiration Date:   01/29/2023    Scheduling Instructions:     Schedule in 6 months    Order Specific Question:   If indicated for the ordered procedure, I authorize the administration of contrast media per Radiology protocol    Answer:   Yes    Order Specific Question:   What is the patient's sedation requirement?    Answer:   No Sedation    Order Specific Question:   Does the patient have a pacemaker or implanted devices?    Answer:   No    Order Specific Question:   Use SRS Protocol?    Answer:   No    Order Specific Question:   [facility]    [facility]   CBC with Differential    Standing Status:   Future    Standing Expiration Date:   01/29/2023   Comprehensive metabolic panel    Standing Status:   Future  Standing Expiration Date:   01/29/2023   Lactate dehydrogenase    Standing Status:   Future    Standing Expiration Date:    01/30/2023    All questions were answered. The patient knows to call the clinic with any problems, questions or concerns.      Jane Canary, MD 01/29/2022 12:50 PM

## 2022-01-29 NOTE — Progress Notes (Signed)
Patient having leg cramps in the mornings with occasional head cramping.

## 2022-02-03 ENCOUNTER — Other Ambulatory Visit: Payer: Self-pay | Admitting: Internal Medicine

## 2022-02-12 ENCOUNTER — Encounter: Payer: Self-pay | Admitting: Internal Medicine

## 2022-02-14 NOTE — Telephone Encounter (Signed)
Santiago Glad can you  call pt's husband and schedule him for a new pt appt.

## 2022-02-25 ENCOUNTER — Encounter: Payer: Self-pay | Admitting: Intensive Care

## 2022-02-25 ENCOUNTER — Inpatient Hospital Stay
Admission: EM | Admit: 2022-02-25 | Discharge: 2022-02-28 | DRG: 092 | Disposition: A | Discharge status: Skilled Nursing Facility | Payer: Medicare Other | Attending: Internal Medicine | Admitting: Internal Medicine

## 2022-02-25 ENCOUNTER — Emergency Department: Payer: Medicare Other

## 2022-02-25 ENCOUNTER — Other Ambulatory Visit: Payer: Self-pay

## 2022-02-25 DIAGNOSIS — E669 Obesity, unspecified: Secondary | ICD-10-CM | POA: Diagnosis present

## 2022-02-25 DIAGNOSIS — F419 Anxiety disorder, unspecified: Secondary | ICD-10-CM | POA: Diagnosis present

## 2022-02-25 DIAGNOSIS — M810 Age-related osteoporosis without current pathological fracture: Secondary | ICD-10-CM | POA: Diagnosis present

## 2022-02-25 DIAGNOSIS — B9710 Unspecified enterovirus as the cause of diseases classified elsewhere: Secondary | ICD-10-CM | POA: Diagnosis present

## 2022-02-25 DIAGNOSIS — E876 Hypokalemia: Secondary | ICD-10-CM | POA: Diagnosis not present

## 2022-02-25 DIAGNOSIS — B9789 Other viral agents as the cause of diseases classified elsewhere: Secondary | ICD-10-CM | POA: Diagnosis present

## 2022-02-25 DIAGNOSIS — R531 Weakness: Secondary | ICD-10-CM | POA: Diagnosis not present

## 2022-02-25 DIAGNOSIS — Z808 Family history of malignant neoplasm of other organs or systems: Secondary | ICD-10-CM | POA: Diagnosis not present

## 2022-02-25 DIAGNOSIS — R4182 Altered mental status, unspecified: Secondary | ICD-10-CM | POA: Diagnosis present

## 2022-02-25 DIAGNOSIS — J069 Acute upper respiratory infection, unspecified: Secondary | ICD-10-CM | POA: Diagnosis present

## 2022-02-25 DIAGNOSIS — I9589 Other hypotension: Secondary | ICD-10-CM

## 2022-02-25 DIAGNOSIS — Z825 Family history of asthma and other chronic lower respiratory diseases: Secondary | ICD-10-CM

## 2022-02-25 DIAGNOSIS — Z90710 Acquired absence of both cervix and uterus: Secondary | ICD-10-CM

## 2022-02-25 DIAGNOSIS — R030 Elevated blood-pressure reading, without diagnosis of hypertension: Secondary | ICD-10-CM

## 2022-02-25 DIAGNOSIS — Z1152 Encounter for screening for COVID-19: Secondary | ICD-10-CM

## 2022-02-25 DIAGNOSIS — Z800 Family history of malignant neoplasm of digestive organs: Secondary | ICD-10-CM | POA: Diagnosis not present

## 2022-02-25 DIAGNOSIS — M545 Low back pain, unspecified: Secondary | ICD-10-CM | POA: Diagnosis present

## 2022-02-25 DIAGNOSIS — R2689 Other abnormalities of gait and mobility: Secondary | ICD-10-CM | POA: Diagnosis present

## 2022-02-25 DIAGNOSIS — Y92009 Unspecified place in unspecified non-institutional (private) residence as the place of occurrence of the external cause: Secondary | ICD-10-CM

## 2022-02-25 DIAGNOSIS — Z807 Family history of other malignant neoplasms of lymphoid, hematopoietic and related tissues: Secondary | ICD-10-CM | POA: Diagnosis not present

## 2022-02-25 DIAGNOSIS — W19XXXA Unspecified fall, initial encounter: Secondary | ICD-10-CM | POA: Diagnosis not present

## 2022-02-25 DIAGNOSIS — Z951 Presence of aortocoronary bypass graft: Secondary | ICD-10-CM | POA: Diagnosis not present

## 2022-02-25 DIAGNOSIS — Z6836 Body mass index (BMI) 36.0-36.9, adult: Secondary | ICD-10-CM | POA: Diagnosis not present

## 2022-02-25 DIAGNOSIS — Z8249 Family history of ischemic heart disease and other diseases of the circulatory system: Secondary | ICD-10-CM

## 2022-02-25 DIAGNOSIS — R2681 Unsteadiness on feet: Secondary | ICD-10-CM | POA: Diagnosis not present

## 2022-02-25 DIAGNOSIS — C8330 Diffuse large B-cell lymphoma, unspecified site: Secondary | ICD-10-CM | POA: Diagnosis present

## 2022-02-25 DIAGNOSIS — I959 Hypotension, unspecified: Secondary | ICD-10-CM | POA: Diagnosis present

## 2022-02-25 DIAGNOSIS — T483X5A Adverse effect of antitussives, initial encounter: Secondary | ICD-10-CM | POA: Diagnosis present

## 2022-02-25 DIAGNOSIS — Z9049 Acquired absence of other specified parts of digestive tract: Secondary | ICD-10-CM

## 2022-02-25 DIAGNOSIS — N3000 Acute cystitis without hematuria: Secondary | ICD-10-CM

## 2022-02-25 DIAGNOSIS — C8331 Diffuse large B-cell lymphoma, lymph nodes of head, face, and neck: Secondary | ICD-10-CM

## 2022-02-25 DIAGNOSIS — R410 Disorientation, unspecified: Secondary | ICD-10-CM | POA: Diagnosis not present

## 2022-02-25 DIAGNOSIS — N1831 Chronic kidney disease, stage 3a: Secondary | ICD-10-CM | POA: Diagnosis present

## 2022-02-25 DIAGNOSIS — Z923 Personal history of irradiation: Secondary | ICD-10-CM

## 2022-02-25 DIAGNOSIS — Z87440 Personal history of urinary (tract) infections: Secondary | ICD-10-CM

## 2022-02-25 DIAGNOSIS — K219 Gastro-esophageal reflux disease without esophagitis: Secondary | ICD-10-CM | POA: Diagnosis present

## 2022-02-25 DIAGNOSIS — Z9181 History of falling: Secondary | ICD-10-CM

## 2022-02-25 DIAGNOSIS — E860 Dehydration: Secondary | ICD-10-CM | POA: Diagnosis not present

## 2022-02-25 DIAGNOSIS — Z79899 Other long term (current) drug therapy: Secondary | ICD-10-CM

## 2022-02-25 DIAGNOSIS — I950 Idiopathic hypotension: Secondary | ICD-10-CM | POA: Diagnosis not present

## 2022-02-25 DIAGNOSIS — G928 Other toxic encephalopathy: Principal | ICD-10-CM | POA: Diagnosis present

## 2022-02-25 DIAGNOSIS — Z888 Allergy status to other drugs, medicaments and biological substances status: Secondary | ICD-10-CM

## 2022-02-25 DIAGNOSIS — Z885 Allergy status to narcotic agent status: Secondary | ICD-10-CM

## 2022-02-25 DIAGNOSIS — N390 Urinary tract infection, site not specified: Secondary | ICD-10-CM

## 2022-02-25 LAB — CBC WITH DIFFERENTIAL/PLATELET
Abs Immature Granulocytes: 0.04 10*3/uL (ref 0.00–0.07)
Basophils Absolute: 0 10*3/uL (ref 0.0–0.1)
Basophils Relative: 0 %
Eosinophils Absolute: 0.1 10*3/uL (ref 0.0–0.5)
Eosinophils Relative: 1 %
HCT: 40.9 % (ref 36.0–46.0)
Hemoglobin: 13.6 g/dL (ref 12.0–15.0)
Immature Granulocytes: 0 %
Lymphocytes Relative: 7 %
Lymphs Abs: 0.7 10*3/uL (ref 0.7–4.0)
MCH: 29.7 pg (ref 26.0–34.0)
MCHC: 33.3 g/dL (ref 30.0–36.0)
MCV: 89.3 fL (ref 80.0–100.0)
Monocytes Absolute: 1 10*3/uL (ref 0.1–1.0)
Monocytes Relative: 11 %
Neutro Abs: 7.4 10*3/uL (ref 1.7–7.7)
Neutrophils Relative %: 81 %
Platelets: 155 10*3/uL (ref 150–400)
RBC: 4.58 MIL/uL (ref 3.87–5.11)
RDW: 12.7 % (ref 11.5–15.5)
WBC: 9.1 10*3/uL (ref 4.0–10.5)
nRBC: 0 % (ref 0.0–0.2)

## 2022-02-25 LAB — COMPREHENSIVE METABOLIC PANEL
ALT: 19 U/L (ref 0–44)
AST: 28 U/L (ref 15–41)
Albumin: 4.6 g/dL (ref 3.5–5.0)
Alkaline Phosphatase: 79 U/L (ref 38–126)
Anion gap: 10 (ref 5–15)
BUN: 11 mg/dL (ref 8–23)
CO2: 25 mmol/L (ref 22–32)
Calcium: 9.3 mg/dL (ref 8.9–10.3)
Chloride: 101 mmol/L (ref 98–111)
Creatinine, Ser: 1.2 mg/dL — ABNORMAL HIGH (ref 0.44–1.00)
GFR, Estimated: 50 mL/min — ABNORMAL LOW (ref >60)
Glucose, Bld: 118 mg/dL — ABNORMAL HIGH (ref 70–99)
Potassium: 3.4 mmol/L — ABNORMAL LOW (ref 3.5–5.1)
Sodium: 136 mmol/L (ref 135–145)
Total Bilirubin: 1 mg/dL (ref 0.3–1.2)
Total Protein: 7.1 g/dL (ref 6.5–8.1)

## 2022-02-25 LAB — URINALYSIS, ROUTINE W REFLEX MICROSCOPIC
Bacteria, UA: NONE SEEN
Bilirubin Urine: NEGATIVE
Glucose, UA: NEGATIVE mg/dL
Ketones, ur: 5 mg/dL — AB
Leukocytes,Ua: NEGATIVE
Nitrite: NEGATIVE
Protein, ur: 30 mg/dL — AB
Specific Gravity, Urine: 1.013 (ref 1.005–1.030)
pH: 5 (ref 5.0–8.0)

## 2022-02-25 MED ORDER — POTASSIUM CHLORIDE CRYS ER 20 MEQ PO TBCR
40.0000 meq | EXTENDED_RELEASE_TABLET | Freq: Once | ORAL | Status: AC
  Administered 2022-02-25: 40 meq via ORAL
  Filled 2022-02-25: qty 2

## 2022-02-25 MED ORDER — LORAZEPAM 1 MG PO TABS
0.5000 mg | ORAL_TABLET | Freq: Once | ORAL | Status: AC | PRN
  Administered 2022-02-25: 0.5 mg via ORAL
  Filled 2022-02-25: qty 1

## 2022-02-25 MED ORDER — SODIUM CHLORIDE 0.9 % IV BOLUS
500.0000 mL | Freq: Once | INTRAVENOUS | Status: AC
  Administered 2022-02-25: 500 mL via INTRAVENOUS

## 2022-02-25 MED ORDER — GADOBUTROL 1 MMOL/ML IV SOLN
9.0000 mL | Freq: Once | INTRAVENOUS | Status: AC | PRN
  Administered 2022-02-25: 9 mL via INTRAVENOUS

## 2022-02-25 MED ORDER — SODIUM CHLORIDE 0.9 % IV SOLN: INTRAVENOUS | Status: AC

## 2022-02-25 MED ORDER — HYDRALAZINE HCL 20 MG/ML IJ SOLN: 5.0000 mg | Freq: Four times a day (QID) | INTRAMUSCULAR | Status: DC | PRN

## 2022-02-25 MED ORDER — SODIUM CHLORIDE 0.9 % IV SOLN
1.0000 g | INTRAVENOUS | Status: DC
  Administered 2022-02-25 – 2022-02-26 (×2): 1 g via INTRAVENOUS
  Filled 2022-02-25 (×2): qty 10

## 2022-02-25 NOTE — ED Triage Notes (Addendum)
Patient had fall at home today resulting in tailbone pain. Patient unsure what caused her to fall. Patient has had frequent urination the past few days with little results. Family reports she fell backwards. Patient denies hitting head. Hx UTI's  HX lymphoma with radiation and has some short term memory issues as result

## 2022-02-25 NOTE — ED Provider Notes (Signed)
[facility] Provider Note    Event Date/Time   First MD Initiated Contact with Patient 02/25/22 1742     (approximate)   History   Fall   HPI  Stacy Murillo is a 66 y.o. female history of CNS lymphoma presents to the ER for evaluation of fall with reported pain in her tailbone.  The fall was witnessed.  Did not strike her head.  Has had some increasing unsteadiness over the past 2 days was given cough syrup Delsym when family noticed that she was having increased confusion.  Children are currently caring for her while her husband is in the hospital status post CABG.  Reportedly the patient has acted like this when she is been diagnosed with a urinary tract infection in the past.  Not currently on any antibiotics.  They did test for COVID twice and both have been negative.  She denies any cough or congestion.  On my exam she denies any back pain or discomfort.     Physical Exam   Triage Vital Signs: ED Triage Vitals  Enc Vitals Group     BP 02/25/22 1601 (!) 175/86     Pulse Rate 02/25/22 1601 99     Resp 02/25/22 1601 18     Temp 02/25/22 1601 98.6 F (37 C)     Temp Source 02/25/22 1601 Oral     SpO2 02/25/22 1601 96 %     Weight 02/25/22 1551 197 lb (89.4 kg)     Height 02/25/22 1551 '5\' 2"'$  (1.575 m)     Head Circumference --      Peak Flow --      Pain Score 02/25/22 1551 10     Pain Loc --      Pain Edu? --      Excl. in [place]? --     Most recent vital signs: Vitals:   02/25/22 1601 02/25/22 2146  BP: (!) 175/86 (!) 151/79  Pulse: 99 84  Resp: 18 16  Temp: 98.6 F (37 C) 98.5 F (36.9 C)  SpO2: 96% 99%     Constitutional: Alert  Eyes: Conjunctivae are normal.  Head: Atraumatic. Nose: No congestion/rhinnorhea. Mouth/Throat: Mucous membranes are moist.   Neck: Painless ROM.  Cardiovascular:   Good peripheral circulation. Respiratory: Normal respiratory effort.  No retractions.  Gastrointestinal: Soft and nontender.   Musculoskeletal:  no deformity Neurologic:  MAE spontaneously. No gross focal neurologic deficits are appreciated.  Skin:  Skin is warm, dry and intact. No rash noted. Psychiatric: Mood and affect are normal. Speech and behavior are normal.    ED Results / Procedures / Treatments   Labs (all labs ordered are listed, but only abnormal results are displayed) Labs Reviewed  COMPREHENSIVE METABOLIC PANEL - Abnormal; Notable for the following components:      Result Value   Potassium 3.4 (*)    Glucose, Bld 118 (*)    Creatinine, Ser 1.20 (*)    GFR, Estimated 50 (*)    All other components within normal limits  URINALYSIS, ROUTINE W REFLEX MICROSCOPIC - Abnormal; Notable for the following components:   Color, Urine YELLOW (*)    APPearance CLEAR (*)    Hgb urine dipstick MODERATE (*)    Ketones, ur 5 (*)    Protein, ur 30 (*)    All other components within normal limits  CBC WITH DIFFERENTIAL/PLATELET     EKG  ED ECG REPORT I, Merlyn Lot, the attending physician, personally  viewed and interpreted this ECG.   Date: 02/25/2022  EKG Time: 16:09  Rate: 90  Rhythm: sinus  Axis: normal  Intervals: normal qt  ST&T Change: no stemi, no depressions    RADIOLOGY Please see ED Course for my review and interpretation.  I personally reviewed all radiographic images ordered to evaluate for the above acute complaints and reviewed radiology reports and findings.  These findings were personally discussed with the patient.  Please see medical record for radiology report.    PROCEDURES:  Critical Care performed: No  Procedures   MEDICATIONS ORDERED IN ED: Medications  sodium chloride 0.9 % bolus 500 mL (0 mLs Intravenous Stopped 02/25/22 2145)  LORazepam (ATIVAN) tablet 0.5 mg (0.5 mg Oral Given 02/25/22 2044)  gadobutrol (GADAVIST) 1 MMOL/ML injection 9 mL (9 mLs Intravenous Contrast Given 02/25/22 2117)     IMPRESSION / MDM / ASSESSMENT AND PLAN / ED COURSE  I  reviewed the triage vital signs and the nursing notes.                              Differential diagnosis includes, but is not limited to, fall, fracture, contusion, SDH, IPH, CVA, electrolyte abnormality, UTI, dehydration  Patient presenting to the ER for evaluation of symptoms as described above.  Based on symptoms, risk factors and considered above differential, this presenting complaint could reflect a potentially life-threatening illness therefore the patient will be placed on continuous pulse oximetry and telemetry for monitoring.  Laboratory evaluation will be sent to evaluate for the above complaints.  Imaging ordered out of triage on my review and interpretation does not show any evidence of IPH.  Mild bump in her creatinine will give IV fluids.  Her urine does appear concentrated.  Will check for UTI.   Clinical Course as of 02/25/22 2220  Sun Feb 25, 2022  1941 Patient reassessed.  Feels well.  Repeat temperature negative.  urinalysis not consistent with infection. [PR]  2018 Attempted ambulation trial patient was severely confused agitated unable to ambulate without 3 person assist which is reported significant change from her baseline.  Family reports that she did take cough medicine last night Delsym was a 12-hour medications now 24 hours out.  Would expect for those effects to have worn off by now.  Given her history of CNS lymphoma not still at baseline will order MRI.  Will consult hospitalist for admission  as she is not safe for discharge. [PR]    Clinical Course User Index [PR] Merlyn Lot, MD      FINAL CLINICAL IMPRESSION(S) / ED DIAGNOSES   Final diagnoses:  Fall, initial encounter  Altered mental status, unspecified altered mental status type     Rx / DC Orders   ED Discharge Orders     None        Note:  This document was prepared using Dragon voice recognition software and may include unintentional dictation errors.    Merlyn Lot,  MD 02/25/22 2220

## 2022-02-25 NOTE — ED Notes (Signed)
Lab called to come draw labs. Pt in CT at this time. Pt to be transferred from wheelchair to recliner once back from CT. Pt will be in triage middle with family members.

## 2022-02-25 NOTE — ED Provider Triage Note (Signed)
  Emergency Medicine Provider Triage Evaluation Note  Stacy Murillo , a 66 y.o.female,  was evaluated in triage.  Pt complains of altered mental status, urinary frequency, and tailbone pain.  She is joined by her family member, who states that patient has not been acting right for the past week.  Initially thought that she had a urinary tract infection because she has had some frequent urination, however urgent care showed that there was only a little hemoglobin in her urine, but no signs of infection.  Additionally states that she fell backwards onto her buttocks and she has had persistent pain in her tailbone ever since.  Review of Systems  Positive: AMS, urinary frequency, double pain. Negative: Denies fever, chest pain, vomiting  Physical Exam   Vitals:   02/25/22 1601  BP: (!) 175/86  Pulse: 99  Resp: 18  Temp: 98.6 F (37 C)  SpO2: 96%   Gen:   Awake, no distress  Resp:  Normal effort  MSK:   Moves extremities without difficulty  Other:    Medical Decision Making  Given the patient's initial medical screening exam, the following diagnostic evaluation has been ordered. The patient will be placed in the appropriate treatment space, once one is available, to complete the evaluation and treatment. I have discussed the plan of care with the patient and I have advised the patient that an ED physician or mid-level practitioner will reevaluate their condition after the test results have been received, as the results may give them additional insight into the type of treatment they may need.    Diagnostics: Labs, EKG, head CT, CXR, sacrum/coccyx x-ray  Treatments: none immediately   Teodoro Spray, [place] 02/25/22 1604

## 2022-02-25 NOTE — H&P (Signed)
History and Physical    Patient: Stacy Murillo ACZ:[id number] DOB: September 02, 1955 DOA: 02/25/2022 DOS: the patient was seen and examined on 02/25/2022 PCP: Crecencio Mc, MD  Patient coming from: Home  Chief Complaint:  Chief Complaint  Patient presents with   Fall   HPI: Stacy Murillo is a 66 y.o. 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  At bedside, patient was somnolent, though easily arousable, but quickly goes back to sleep.  Most of the history was obtained from ED physician and son at bedside. Per son, patient complained of low back pain about 2 days ago, she was also given Delsym cough syrup due to having cough in the last 2 days.  Last night (10/14) patient was noted to develop increased urinary frequency with incontinence and it was difficult for her to make it to the bathroom.  This morning, she presents with increased weakness in legs (She has baseline gait instability, but was able to ambulate without any assistive device) and about 11:30 AM, she had a fall in the driveway without hitting her head.  Patient's primary care giver was her husband who is currently at [facility] s/p CABG.  She is currently being taken care of by her children.  Patient was reported to usually present with use altered mental status when she has UTI.  At baseline, patient was able to maintain normal conversation without any confusion.  ED Course:  In the emergency department, BP was elevated at 175/86, but other vital signs were within normal range.  Work-up in the ED showed normal CBC and BMP except for potassium 3.4, creatinine 1.20 and blood glucose of 118.  Urinalysis was unimpressive for UTI. Chest x-ray showed no active cardiopulmonary disease Sacrum and coccyx x-ray showed no evidence of fracture or other  focal bone lesions CT head without contrast showed no acute intracranial abnormality MRI head without and with contrast showed no acute intracranial abnormality but showed remote hemorrhage in the left frontal lobe and findings of advanced chronic small vessel ischemia. She was treated with Ativan 0.5 mg, IV hydration 500 mL was given.  Hospitalist was asked to admit patient for further evaluation and management.  Review of Systems: Review of systems as noted in the HPI. All other systems reviewed and are negative.   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   Leg cramps    Lymphoma ([place]) 09/14/15   Monoclonal B cell lymphoma   Night sweats    Osteoporosis    Overweight(278.02)    Obesity   Palpitations    Shortness of breath dyspnea    Tachycardia    Thrombocytopenia ([place])    Past Surgical History:  Procedure Laterality Date   ABDOMINAL HYSTERECTOMY  2005   BONE MARROW BIOPSY  09/14/15   CHOLECYSTECTOMY  1997   COLONOSCOPY  07/05/2014   ESOPHAGOGASTRODUODENOSCOPY  07/05/2014   INGUINAL LYMPH NODE BIOPSY Right 10/05/2015   Procedure: INGUINAL LYMPH NODE BIOPSY;  Surgeon: Christene Lye, MD;  Location: [facility] ORS;  Service: General;  Laterality: Right;   PERIPHERAL VASCULAR CATHETERIZATION N/A 09/27/2015   Procedure: Glori Luis Cath Insertion;  Surgeon: Algernon Huxley, MD;  Location: [place] [facility];  Service: Cardiovascular;  Laterality: N/A;   PORTA CATH REMOVAL Right 06/03/2017   Procedure: PORTA CATH REMOVAL, with venous thrombectomy;  Surgeon: Algernon Huxley, MD;  Location: [facility];  Service: Cardiovascular;  Laterality: Right;   PORTACATH PLACEMENT Right     Social History:  reports that she has never smoked.  She has never used smokeless tobacco. She reports that she does not drink alcohol and does not use drugs.   Allergies  Allergen Reactions   Ondansetron Hcl Other (See Comments)    Severe constipation    Oxycodone     Biliary colic with opioids s/p cholecystectomy     Rituximab Other (See Comments)    Pt reports throat tightness and facial swelling and redness during infusion of Rituxan    Family History  Problem Relation Age of Onset   Heart disease Father        CABG x 4   Multiple myeloma Father    Hypertension Mother    Pancreatic cancer Mother    Ulcers Mother    Asthma Mother    Brain cancer Maternal Uncle    Multiple myeloma Maternal Uncle    Diabetes Neg Hx    Breast cancer Neg Hx      Prior to Admission medications   Medication Sig Start Date End Date Taking? Authorizing Provider  acetaminophen (TYLENOL) 650 MG CR tablet Take 650 mg by mouth every 8 (eight) hours as needed for pain.    [provider]  ALPRAZolam Duanne Moron) 0.25 MG tablet TAKE ONE TABLET AT BEDTIME AS NEEDED FORANXIETY 04/25/21   Crecencio Mc, MD  midodrine (PROAMATINE) 5 MG tablet TAKE ONE AND ONE-HALF TABLETS THREE TIMES A DAY 02/05/22   Cammie Sickle, MD  ondansetron (ZOFRAN-ODT) 4 MG disintegrating tablet Take 1 tablet (4 mg total) by mouth every 8 (eight) hours as needed for nausea or vomiting. 07/21/20   Verlon Au, NP    Physical Exam: BP (!) 151/79   Pulse 84   Temp 98.5 F (36.9 C) (Oral)   Resp 16   Ht 5' 2" (1.575 m)   Wt 89.4 kg   SpO2 99%   BMI 36.03 kg/m   General: 66 y.o. year-old female ill appearing, but in no acute distress.  Alert and oriented x3. HEENT: NCAT, EOMI, dry mucous membranes Neck: Supple, trachea medial Cardiovascular: Regular rate and rhythm with no rubs or gallops.  No thyromegaly or JVD noted.  No lower extremity edema. 2/4 pulses in all 4 extremities. Respiratory: Clear to auscultation with no wheezes or rales. Good inspiratory  effort. Abdomen: Soft, nontender nondistended with normal bowel sounds x4 quadrants. Muskuloskeletal: No cyanosis, clubbing or edema noted bilaterally Neuro: CN II-XII intact,  sensation, reflexes intact Skin: No ulcerative lesions noted or rashes Psychiatry: Mood is appropriate for condition and setting          Labs on Admission:  Basic Metabolic Panel: Recent Labs  Lab 02/25/22 1602  NA 136  K 3.4*  CL 101  CO2 25  GLUCOSE 118*  BUN 11  CREATININE 1.20*  CALCIUM 9.3   Liver Function Tests: Recent Labs  Lab 02/25/22 1602  AST 28  ALT 19  ALKPHOS 79  BILITOT 1.0  PROT 7.1  ALBUMIN 4.6   No results for input(s): "LIPASE", "AMYLASE" in  the last 168 hours. No results for input(s): "AMMONIA" in the last 168 hours. CBC: Recent Labs  Lab 02/25/22 1602  WBC 9.1  NEUTROABS 7.4  HGB 13.6  HCT 40.9  MCV 89.3  PLT 155   Cardiac Enzymes: No results for input(s): "CKTOTAL", "CKMB", "CKMBINDEX", "TROPONINI" in the last 168 hours.  BNP (last 3 results) No results for input(s): "BNP" in the last 8760 hours.  ProBNP (last 3 results) No results for input(s): "PROBNP" in the last 8760 hours.  CBG: No results for input(s): "GLUCAP" in the last 168 hours.  Radiological Exams on Admission: MR Brain W and Wo Contrast  Result Date: 02/25/2022 CLINICAL DATA:  Mental status change EXAM: MRI HEAD WITHOUT AND WITH CONTRAST TECHNIQUE: Multiplanar, multiecho pulse sequences of the brain and surrounding structures were obtained without and with intravenous contrast. CONTRAST:  75m GADAVIST GADOBUTROL 1 MMOL/ML IV SOLN COMPARISON:  None Available. FINDINGS: Brain: No acute infarct, mass effect or extra-axial collection. Remote hemorrhage in the left frontal lobe. There is confluent hyperintense T2-weighted signal within the white matter. Generalized volume loss. The midline structures are normal. Vascular: Major flow voids are preserved. Skull and upper cervical spine: Normal  calvarium and skull base. Visualized upper cervical spine and soft tissues are normal. Sinuses/Orbits:No paranasal sinus fluid levels or advanced mucosal thickening. No mastoid or middle ear effusion. Normal orbits. IMPRESSION: 1. No acute intracranial abnormality. 2. Remote hemorrhage in the left frontal lobe and findings of advanced chronic small vessel ischemia. Electronically Signed   By: KUlyses JarredM.D.   On: 02/25/2022 21:34   DG Chest 2 View  Result Date: 02/25/2022 CLINICAL DATA:  Weakness EXAM: CHEST - 2 VIEW COMPARISON:  Chest x-ray 06/02/2017 FINDINGS: Left chest port catheter tip projects over the brachiocephalic SVC junction. The heart size and mediastinal contours are within normal limits. Both lungs are clear. The visualized skeletal structures are unremarkable. IMPRESSION: No active cardiopulmonary disease. Electronically Signed   By: ARonney AstersM.D.   On: 02/25/2022 17:10   DG Sacrum/Coccyx  Result Date: 02/25/2022 CLINICAL DATA:  Tailbone pain after a fall. EXAM: SACRUM AND COCCYX - 2+ VIEW COMPARISON:  None Available. FINDINGS: There is no evidence of fracture or other focal bone lesions. IMPRESSION: Negative. Electronically Signed   By: EMisty StanleyM.D.   On: 02/25/2022 17:09   CT Head Wo Contrast  Result Date: 02/25/2022 CLINICAL DATA:  Mental status changes.  Status post fall. EXAM: CT HEAD WITHOUT CONTRAST TECHNIQUE: Contiguous axial images were obtained from the base of the skull through the vertex without intravenous contrast. RADIATION DOSE REDUCTION: This exam was performed according to the departmental dose-optimization program which includes automated exposure control, adjustment of the mA and/or kV according to patient size and/or use of iterative reconstruction technique. COMPARISON:  09/30/2015.  MRI brain 01/25/2022. FINDINGS: Brain: There is no evidence for acute hemorrhage, hydrocephalus, mass lesion, or abnormal extra-axial fluid collection. No definite CT  evidence for acute infarction. Diffuse loss of parenchymal volume is consistent with atrophy. Symmetric marked low-attenuation the deep hemispheric white matter bilaterally is similar to recent MRI in this patient status post whole brain radiation for CNS lymphoma. Vascular: No hyperdense vessel or unexpected calcification. Skull: No evidence for fracture. No worrisome lytic or sclerotic lesion. Sinuses/Orbits: The visualized paranasal sinuses and mastoid air cells are clear. Visualized portions of the globes and intraorbital fat are unremarkable. Other: None. IMPRESSION: 1. No acute intracranial abnormality. 2. Symmetric marked low-attenuation the deep hemispheric white matter  bilaterally is compatible with white matter changes seen on recent MRI in this patient status post whole brain radiation for CNS lymphoma. 3. Atrophy with chronic small vessel ischemic disease. Electronically Signed   By: Misty Stanley M.D.   On: 02/25/2022 16:38    EKG: I independently viewed the EKG done and my findings are as followed: Normal sinus rhythm at a rate of 93 bpm with nonspecific ST and T wave abnormality  Assessment/Plan Present on Admission:  Altered mental status  DLBCL (diffuse large B cell lymphoma) ([place])  Principal Problem:   Altered mental status Active Problems:   DLBCL (diffuse large B cell lymphoma) (HCC)   UTI (urinary tract infection)   Hypotension   Fall at home, initial encounter   Hypokalemia   Gait instability   Elevated BP without diagnosis of hypertension  Altered mental status possibly secondary to multifactorial including UTI POA Patient presents with irritative bladder symptoms, though, urine was unconvincing for UTI Patient has history of E. coli UTI based on urine culture done on 02/23/2021 and 10/18/2020 Patient will be empirically started with IV ceftriaxone with plan to discontinue/de-escalate based on urine culture Patient was also reported to have taken Delsym which could have  contributed to her confusion.  This has since been held.  Continue to monitor patient for improvement  Fall at home possibly secondary to above Continue treatment as described above Continue fall precaution Continue PT/OT eval and treat  Hypokalemia K+ 3.4, this will be replenished  Dehydration Continue IV hydration  Secondary non-EBV mediated lymphoma of brain  CT head showed no acute intracranial pathology MRI brain with and without contrast showed no acute intracranial abnormality.  Remote hemorrhage in the left frontal lobe and findings of advanced chronic small vessel ischemia Patient had a recent follow-up with oncologist (Dr. Darrall Dears) on 01/29/2022 and recommended a 68-monthfollow-up  Gait instability Patient has history of gait instability at baseline, however, she was able to ambulate without any assistive device. Continue fall precaution Continue PT/OT eval and treat  Hypotension BP is currently elevated, midodrine will be temporarily held  Anxiety Continuing Xanax per home regimen  Elevated BP Continue IV vancomycin 536mevery 6 hours as needed for SBP > 170  DVT prophylaxis: Lovenox  Code Status: Full code  Consults: None  Family Communication: Son at bedside (all questions answered to satisfaction)  Severity of Illness: The appropriate patient status for this patient is INPATIENT. Inpatient status is judged to be reasonable and necessary in order to provide the required intensity of service to ensure the patient's safety. The patient's presenting symptoms, physical exam findings, and initial radiographic and laboratory data in the context of their chronic comorbidities is felt to place them at high risk for further clinical deterioration. Furthermore, it is not anticipated that the patient will be medically stable for discharge from the hospital within 2 midnights of admission.   * I certify that at the point of admission it is my clinical judgment that the  patient will require inpatient hospital care spanning beyond 2 midnights from the point of admission due to high intensity of service, high risk for further deterioration and high frequency of surveillance required.*  Author: OlBernadette HoitDO 02/25/2022 11:53 PM  For on call review [email or web address]

## 2022-02-25 NOTE — ED Notes (Signed)
Report to john, rn

## 2022-02-25 NOTE — ED Triage Notes (Signed)
Pt in via EMS from home with c/o coccyx pain after a fall this am on her behind. No LOC, no thinners. Hx of lymphoma

## 2022-02-26 ENCOUNTER — Encounter: Payer: Self-pay | Admitting: Internal Medicine

## 2022-02-26 ENCOUNTER — Telehealth: Payer: Self-pay | Admitting: *Deleted

## 2022-02-26 DIAGNOSIS — R410 Disorientation, unspecified: Secondary | ICD-10-CM

## 2022-02-26 LAB — CBC
HCT: 39.1 % (ref 36.0–46.0)
Hemoglobin: 13.1 g/dL (ref 12.0–15.0)
MCH: 29.6 pg (ref 26.0–34.0)
MCHC: 33.5 g/dL (ref 30.0–36.0)
MCV: 88.5 fL (ref 80.0–100.0)
Platelets: 143 10*3/uL — ABNORMAL LOW (ref 150–400)
RBC: 4.42 MIL/uL (ref 3.87–5.11)
RDW: 12.7 % (ref 11.5–15.5)
WBC: 6.1 10*3/uL (ref 4.0–10.5)
nRBC: 0 % (ref 0.0–0.2)

## 2022-02-26 LAB — RESPIRATORY PANEL BY PCR

## 2022-02-26 LAB — APTT: aPTT: 24 seconds (ref 24–36)

## 2022-02-26 LAB — COMPREHENSIVE METABOLIC PANEL
ALT: 15 U/L (ref 0–44)
AST: 23 U/L (ref 15–41)
Albumin: 3.9 g/dL (ref 3.5–5.0)
Alkaline Phosphatase: 67 U/L (ref 38–126)
Anion gap: 9 (ref 5–15)
BUN: 11 mg/dL (ref 8–23)
CO2: 24 mmol/L (ref 22–32)
Calcium: 9.4 mg/dL (ref 8.9–10.3)
Chloride: 105 mmol/L (ref 98–111)
Creatinine, Ser: 1.15 mg/dL — ABNORMAL HIGH (ref 0.44–1.00)
GFR, Estimated: 52 mL/min — ABNORMAL LOW (ref >60)
Glucose, Bld: 114 mg/dL — ABNORMAL HIGH (ref 70–99)
Potassium: 4.1 mmol/L (ref 3.5–5.1)
Sodium: 138 mmol/L (ref 135–145)
Total Bilirubin: 0.9 mg/dL (ref 0.3–1.2)
Total Protein: 6.5 g/dL (ref 6.5–8.1)

## 2022-02-26 LAB — PHOSPHORUS: Phosphorus: 3.1 mg/dL (ref 2.5–4.6)

## 2022-02-26 LAB — MAGNESIUM: Magnesium: 2.1 mg/dL (ref 1.7–2.4)

## 2022-02-26 LAB — SARS CORONAVIRUS 2 BY RT PCR: SARS Coronavirus 2 by RT PCR: NEGATIVE

## 2022-02-26 LAB — HIV ANTIBODY (ROUTINE TESTING W REFLEX): HIV Screen 4th Generation wRfx: NONREACTIVE

## 2022-02-26 MED ORDER — ALPRAZOLAM 0.25 MG PO TABS
0.2500 mg | ORAL_TABLET | Freq: Every evening | ORAL | Status: DC | PRN
  Administered 2022-02-26: 0.25 mg via ORAL
  Filled 2022-02-26: qty 1

## 2022-02-26 MED ORDER — ACETAMINOPHEN 650 MG RE SUPP: 650.0000 mg | Freq: Four times a day (QID) | RECTAL | Status: DC | PRN

## 2022-02-26 MED ORDER — ENOXAPARIN SODIUM 60 MG/0.6ML IJ SOSY
0.5000 mg/kg | PREFILLED_SYRINGE | INTRAMUSCULAR | Status: DC
  Administered 2022-02-26 – 2022-02-27 (×2): 45 mg via SUBCUTANEOUS
  Filled 2022-02-26 (×2): qty 0.6

## 2022-02-26 MED ORDER — ACETAMINOPHEN 325 MG PO TABS
650.0000 mg | ORAL_TABLET | Freq: Four times a day (QID) | ORAL | Status: DC | PRN
  Administered 2022-02-26: 650 mg via ORAL
  Filled 2022-02-26: qty 2

## 2022-02-26 NOTE — Evaluation (Signed)
Occupational Therapy Evaluation Patient Details Name: Stacy Murillo MRN: [id number] DOB: 17-Jan-1956 Today's Date: 02/26/2022   History of Present Illness [name] is a 20yoF who comes to [facility] on 02/25/22 after fall at home leading to pain in the tailbone, she presents with increased weakness in legs progressed from baseline gait instability but typically able to AMB without device. Pt sustained a 2nd fall in the driveway without hitting her head.  Patient's primary care giver was her husband who is currently at [facility] s/p CABG. 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 Coccyx imaging unrevealing, Brain MRI showing chronic/remote changes only.   Clinical Impression   [name] was seen for OT evaluation this date. Prior to hospital admission, pt was MOD I for mobility and ADLs. Pt lives with spouse who is currently undergoing CABG at [facility]. Pt presents to acute OT demonstrating impaired ADL performance and functional mobility 2/2 decreased activity tolerance and functional strength/ROM/balance deficits. Pt currently requires MIN A + HHA sit<>stand and steps along EOB, poor eccentric control and decreased safety awareness. SETUP + SUPERVISION seated grooming tasks, poor standing tolerance requires BUE support. Pt would benefit from skilled OT to address noted impairments and functional limitations (see below for any additional details). Upon hospital discharge, recommend STR to maximize pt safety and return to PLOF.    Recommendations for follow up therapy are one component of a multi-disciplinary discharge planning process, led by the attending physician.  Recommendations may be updated based on patient status, additional functional criteria and insurance authorization.   Follow Up Recommendations  Skilled nursing-short term rehab (<3 hours/day)    Assistance Recommended at Discharge Frequent or constant Supervision/Assistance   Patient can return home with the following A lot of help with walking and/or transfers;A lot of help with bathing/dressing/bathroom;Help with stairs or ramp for entrance    Functional Status Assessment  Patient has had a recent decline in their functional status and demonstrates the ability to make significant improvements in function in a reasonable and predictable amount of time.  Equipment Recommendations  BSC/3in1    Recommendations for Other Services       Precautions / Restrictions Precautions Precautions: Fall Restrictions Weight Bearing Restrictions: No      Mobility Bed Mobility Overal bed mobility: Needs Assistance Bed Mobility: Supine to Sit, Sit to Supine     Supine to sit: Supervision Sit to supine: Supervision        Transfers Overall transfer level: Needs assistance Equipment used: 1 person hand held assist Transfers: Sit to/from Stand Sit to Stand: Min assist           General transfer comment: poor eccentric control      Balance Overall balance assessment: History of Falls, Needs assistance Sitting-balance support: No upper extremity supported, Feet supported Sitting balance-Leahy Scale: Good     Standing balance support: Bilateral upper extremity supported Standing balance-Leahy Scale: Poor                             ADL either performed or assessed with clinical judgement   ADL Overall ADL's : Needs assistance/impaired                                       General ADL Comments: SETUP + SUPERVISION seated grooming tasks, poor standing tolerance requires  BUE support. MIN A don gown seated. MOD A for LB access seated EOB. MIN A + HHA for simulated BSC t/f      Pertinent Vitals/Pain Pain Assessment Pain Assessment: Faces Faces Pain Scale: Hurts little more Pain Location: tailbone pain when sitting EOB Pain Descriptors / Indicators: Aching, Burning Pain Intervention(s): Limited activity within patient's  tolerance, Repositioned     Hand Dominance Right   Extremity/Trunk Assessment Upper Extremity Assessment Upper Extremity Assessment: Overall WFL for tasks assessed   Lower Extremity Assessment Lower Extremity Assessment: Generalized weakness       Communication Communication Communication: No difficulties   Cognition Arousal/Alertness: Awake/alert Behavior During Therapy: WFL for tasks assessed/performed Overall Cognitive Status: Impaired/Different from baseline Area of Impairment: Orientation, Following commands, Safety/judgement                 Orientation Level: Disoriented to, Place, Time, Situation     Following Commands: Follows one step commands inconsistently Safety/Judgement: Decreased awareness of safety, Decreased awareness of deficits     General Comments: answers orientation questions with logical cues (O-log 19/30)      Home Living Family/patient expects to be discharged to:: Private residence Living Arrangements: Spouse/significant other Available Help at Discharge: Family (DTR lives local, 2 other family; 2 sons live in [place]) Type of Home: House Home Access: Stairs to enter Technical brewer of Steps: 1   Home Layout: Full bath on main level;Able to live on main level with bedroom/bathroom;Other (Comment)               Home Equipment: Rolling Walker (2 wheels);Wheelchair - manual          Prior Functioning/Environment Prior Level of Function : Independent/Modified Independent             Mobility Comments: no AD ADLs Comments: long-standing ST memory issues s/p CA treatments - asssit medication mgmt        OT Problem List: Decreased strength;Decreased range of motion;Decreased activity tolerance;Impaired balance (sitting and/or standing);Decreased safety awareness;Decreased cognition      OT Treatment/Interventions: Self-care/ADL training;Therapeutic exercise;Energy conservation;DME and/or AE instruction;Therapeutic  activities;Patient/family education;Balance training;Cognitive remediation/compensation    OT Goals(Current goals can be found in the care plan section) Acute Rehab OT Goals Patient Stated Goal: to go home OT Goal Formulation: With patient/family Time For Goal Achievement: 03/12/22 Potential to Achieve Goals: Good ADL Goals Pt Will Perform Grooming: with modified independence;standing Pt Will Perform Lower Body Dressing: with modified independence;sit to/from stand Pt Will Transfer to Toilet: with modified independence;ambulating;regular height toilet  OT Frequency: Min 2X/week    Co-evaluation              AM-PAC OT "6 Clicks" Daily Activity     Outcome Measure Help from another person eating meals?: None Help from another person taking care of personal grooming?: A Little Help from another person toileting, which includes using toliet, bedpan, or urinal?: A Little Help from another person bathing (including washing, rinsing, drying)?: A Lot Help from another person to put on and taking off regular upper body clothing?: A Little Help from another person to put on and taking off regular lower body clothing?: A Lot 6 Click Score: 17   End of Session Nurse Communication: Mobility status  Activity Tolerance: Patient tolerated treatment well Patient left: in bed;with call bell/phone within reach;with bed alarm set;with family/visitor present  OT Visit Diagnosis: Other abnormalities of gait and mobility (R26.89);Muscle weakness (generalized) (M62.81)  Time: 9122-5834 OT Time Calculation (min): 28 min Charges:  OT General Charges $OT Visit: 1 Visit OT Evaluation $OT Eval Moderate Complexity: 1 Mod OT Treatments $Self Care/Home Management : 8-22 mins  Dessie Coma, M.S. OTR/L  02/26/22, 3:16 PM  ascom [phone]

## 2022-02-26 NOTE — TOC Initial Note (Signed)
Transition of Care [facility]) - Initial/Assessment Note    Patient Details  Name: Stacy Murillo MRN: [id number] Date of Birth: [birth year]  Transition of Care [facility]) CM/SW Contact:    Alberteen Sam, LCSW Phone Number: 02/26/2022, 4:09 PM  Clinical Narrative:                  CSW spoke with patient spouse and son regarding SNF recs, they report being in agreement with preference for [facility]. CSW has sent referral pending bed offer.   Expected Discharge Plan: Skilled Nursing Facility Barriers to Discharge: Continued Medical Work up   Patient Goals and CMS Choice Patient states their goals for this hospitalization and ongoing recovery are:: to go home CMS Medicare.gov Compare Post Acute Care list provided to:: Patient Choice offered to / list presented to : Patient  Expected Discharge Plan and Services Expected Discharge Plan: [place] arrangements for the past 2 months: Single Family Home                                      Prior Living Arrangements/Services Living arrangements for the past 2 months: Single Family Home Lives with:: Spouse                   Activities of Daily Living   ADL Screening (condition at time of admission) Patient's cognitive ability adequate to safely complete daily activities?: Yes Is the patient deaf or have difficulty hearing?: No Does the patient have difficulty seeing, even when wearing glasses/contacts?: No Does the patient have difficulty concentrating, remembering, or making decisions?: No Patient able to express need for assistance with ADLs?: Yes Does the patient have difficulty dressing or bathing?: No Independently performs ADLs?: Yes (appropriate for developmental age) Does the patient have difficulty walking or climbing stairs?: Yes Weakness of Legs: Both Weakness of Arms/Hands: None  Permission Sought/Granted                  Emotional Assessment              Admission  diagnosis:  Altered mental status [R41.82] Patient Active Problem List   Diagnosis Date Noted   Altered mental status 02/25/2022   Fall at home, initial encounter 02/25/2022   Hypokalemia 02/25/2022   Gait instability 02/25/2022   Elevated BP without diagnosis of hypertension 02/25/2022   Dehydration 02/25/2022   COVID-19 05/09/2021   Hypotension 03/25/2021   UTI (urinary tract infection) 03/16/2021   Mild cognitive impairment with memory loss 04/21/2020   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/08/2019   Goals of care, counseling/discussion 03/08/2019   Lesion of frontal lobe of brain 02/19/2019   Weight gain, abnormal 12/23/2018   Generalized anxiety disorder 02/09/2018   History of autologous stem cell transplant ([place]) 01/15/2018   Immunization due 01/15/2018   Vaginal dryness, menopausal 12/18/2017   Stem cell transplant candidate 05/30/2017   Diverticulitis large intestine w/o perforation or abscess w/bleeding 02/17/2017   DLBCL (diffuse large B cell lymphoma) ([place]) 05/25/2016   DVT (deep venous thrombosis) ([place]) 10/27/2015   Generalized headaches 10/27/2015   Oral mucositis due to antineoplastic therapy 10/14/2015   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[place]) 09/21/2015   Left knee pain 07/20/2014   Esophageal reflux 04/06/2014   Special screening for malignant neoplasms, colon 04/06/2014  Screening for breast cancer 07/10/2011   Osteopenia 07/06/2011   Vitamin D deficiency 07/06/2011   Hyperlipidemia 07/06/2011   Disorder of bone and cartilage 07/06/2011   Osteoporosis    Overweight 10/19/2008   PCP:  Crecencio Mc, MD Pharmacy:   [place], [address] [place] [address] Phone: [phone] Fax: [phone]  [place], [place] [address] [place] [address] Phone: [phone] Fax:  [phone]     Social Determinants of Health (SDOH) Interventions    Readmission Risk Interventions     No data to display

## 2022-02-26 NOTE — Progress Notes (Signed)
I spoke with patient's son and daughter regarding recent hospital. P in the absence of any recurrent malignancy, I suspect acute infection resulting in acute decompensation/fall.  Recommend physical therapy/increasing protein in the diet and rehab to help her to her baseline health.    Unfortunately patient has had significant cognitive/motor deficits from her treatment for lymphoma-which unfortunately is not going to get any  better.  Family appreciate the phone call.  Follow-up as planned.  GB

## 2022-02-26 NOTE — ED Notes (Signed)
Assumed care of pt. Pt's brother and sister-in-law at bedside. Pt aaox4.

## 2022-02-26 NOTE — Telephone Encounter (Signed)
Patient son Fatima Sanger called stating patient is in hospital and is not doing well mentally or physically and is considering short term placement He is asking to speak with Dr B for input regarding this [phone]

## 2022-02-26 NOTE — Progress Notes (Addendum)
Progress Note    Stacy Murillo  PJA:[id number] DOB: 1955/08/24  DOA: 02/25/2022 PCP: Crecencio Mc, MD      Brief Narrative:    Medical records reviewed and are as summarized below:  Stacy Murillo is a 66 y.o. 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  She complained of pain in her tailbone after the fall.  Family also reported increasing confusion.  She has had a cough for about 2 days prior to admission and they believe she caught something from her brother who has been sick.  She took Delsym cough syrup at home because of her cough.  She also developed increasing urinary frequency and incontinence.      Assessment/Plan:   Principal Problem:   Altered mental status Active Problems:   DLBCL (diffuse large B cell lymphoma) (HCC)   UTI (urinary tract infection)   Hypotension   Fall at home, initial encounter   Hypokalemia   Gait instability   Elevated BP without diagnosis of hypertension   Dehydration    Body mass index is 36.03 kg/m.   Acute toxic metabolic encephalopathy: Probably multifactorial from Delsym cough syrup, UTI.  Continue supportive care.  No acute abnormality on MRI brain.  S/p fall at home, low back pain, unsteady gait: X-ray of sacrum/coccyx was negative for fracture.  PT and OT evaluation  Suspected UTI, urinary frequency and incontinence: Continue empiric IV ceftriaxone.  Follow-up urine culture.  Cough: Chest x-ray was unremarkable.  COVID test done today was negative.  Check respiratory viral panel.  Hypokalemia: Improved  Diffuse large B-cell lymphoma: Outpatient follow-up with oncologist.  Other comorbidities include sleep CKD stage IIIa, anxiety, history of hypotension on  midodrine   Diet Order             Diet Heart Room service appropriate? Yes; Fluid consistency: Thin  Diet effective now                            Consultants: None  Procedures: None    Medications:    enoxaparin (LOVENOX) injection  0.5 mg/kg Subcutaneous Q24H   Continuous Infusions:  sodium chloride     cefTRIAXone (ROCEPHIN)  IV Stopped (02/26/22 0230)     Anti-infectives (From admission, onward)    Start     Dose/Rate Route Frequency Ordered Stop   02/25/22 2330  cefTRIAXone (ROCEPHIN) 1 g in sodium chloride 0.9 % 100 mL IVPB        1 g 200 mL/hr over 30 Minutes Intravenous Every 24 hours 02/25/22 2328                Family Communication/Anticipated D/C date and plan/Code Status   DVT prophylaxis: SCDs Start: 02/26/22 0855     Code Status: Full Code  Family Communication: Plan discussed with Katie, daughter, at bedside Disposition Plan: Plan to discharge SNF in 1 to 2 days   Status is: Inpatient Remains inpatient appropriate because: Confusion, weakness       Subjective:   Interval events noted.  She complains of cough.  She is confused and unable to provide an adequate history.  Katie, daughter, was at the bedside.  Objective:    Vitals:   02/26/22 0209 02/26/22 0300  02/26/22 0603 02/26/22 0915  BP:  136/88 (!) 147/70   Pulse:  97 92 (!) 129  Resp:  (!) 23 17   Temp: 98.3 F (36.8 C)  98.3 F (36.8 C)   TempSrc: Oral  Oral   SpO2:  97% 97%   Weight:      Height:       No data found.  No intake or output data in the 24 hours ending 02/26/22 0949 Filed Weights   02/25/22 1551  Weight: 89.4 kg    Exam:   GEN: NAD SKIN: No rash EYES: EOMI ENT: MMM CV: RRR PULM: CTA B ABD: soft, obese, NT, +BS CNS: AAO x 1 (person), non focal EXT: No edema or tenderness       Data Reviewed:   I have personally reviewed following labs and imaging studies:  Labs: Labs show the following:   Basic Metabolic  Panel: Recent Labs  Lab 02/25/22 1602  NA 136  K 3.4*  CL 101  CO2 25  GLUCOSE 118*  BUN 11  CREATININE 1.20*  CALCIUM 9.3   GFR Estimated Creatinine Clearance: 47.3 mL/min (A) (by C-G formula based on SCr of 1.2 mg/dL (H)). Liver Function Tests: Recent Labs  Lab 02/25/22 1602  AST 28  ALT 19  ALKPHOS 79  BILITOT 1.0  PROT 7.1  ALBUMIN 4.6   No results for input(s): "LIPASE", "AMYLASE" in the last 168 hours. No results for input(s): "AMMONIA" in the last 168 hours. Coagulation profile No results for input(s): "INR", "PROTIME" in the last 168 hours.  CBC: Recent Labs  Lab 02/25/22 1602  WBC 9.1  NEUTROABS 7.4  HGB 13.6  HCT 40.9  MCV 89.3  PLT 155   Cardiac Enzymes: No results for input(s): "CKTOTAL", "CKMB", "CKMBINDEX", "TROPONINI" in the last 168 hours. BNP (last 3 results) No results for input(s): "PROBNP" in the last 8760 hours. CBG: No results for input(s): "GLUCAP" in the last 168 hours. D-Dimer: No results for input(s): "DDIMER" in the last 72 hours. Hgb A1c: No results for input(s): "HGBA1C" in the last 72 hours. Lipid Profile: No results for input(s): "CHOL", "HDL", "LDLCALC", "TRIG", "CHOLHDL", "LDLDIRECT" in the last 72 hours. Thyroid function studies: No results for input(s): "TSH", "T4TOTAL", "T3FREE", "THYROIDAB" in the last 72 hours.  Invalid input(s): "FREET3" Anemia work up: No results for input(s): "VITAMINB12", "FOLATE", "FERRITIN", "TIBC", "IRON", "RETICCTPCT" in the last 72 hours. Sepsis Labs: Recent Labs  Lab 02/25/22 1602  WBC 9.1    Microbiology No results found for this or any previous visit (from the past 240 hour(s)).  Procedures and diagnostic studies:  MR Brain W and Wo Contrast  Result Date: 02/25/2022 CLINICAL DATA:  Mental status change EXAM: MRI HEAD WITHOUT AND WITH CONTRAST TECHNIQUE: Multiplanar, multiecho pulse sequences of the brain and surrounding structures were obtained without and with intravenous  contrast. CONTRAST:  45m GADAVIST GADOBUTROL 1 MMOL/ML IV SOLN COMPARISON:  None Available. FINDINGS: Brain: No acute infarct, mass effect or extra-axial collection. Remote hemorrhage in the left frontal lobe. There is confluent hyperintense T2-weighted signal within the white matter. Generalized volume loss. The midline structures are normal. Vascular: Major flow voids are preserved. Skull and upper cervical spine: Normal calvarium and skull base. Visualized upper cervical spine and soft tissues are normal. Sinuses/Orbits:No paranasal sinus fluid levels or advanced mucosal thickening. No mastoid or middle ear effusion. Normal orbits. IMPRESSION: 1. No acute intracranial abnormality. 2. Remote hemorrhage in the left frontal lobe and findings of advanced  chronic small vessel ischemia. Electronically Signed   By: Ulyses Jarred M.D.   On: 02/25/2022 21:34   DG Chest 2 View  Result Date: 02/25/2022 CLINICAL DATA:  Weakness EXAM: CHEST - 2 VIEW COMPARISON:  Chest x-ray 06/02/2017 FINDINGS: Left chest port catheter tip projects over the brachiocephalic SVC junction. The heart size and mediastinal contours are within normal limits. Both lungs are clear. The visualized skeletal structures are unremarkable. IMPRESSION: No active cardiopulmonary disease. Electronically Signed   By: Ronney Asters M.D.   On: 02/25/2022 17:10   DG Sacrum/Coccyx  Result Date: 02/25/2022 CLINICAL DATA:  Tailbone pain after a fall. EXAM: SACRUM AND COCCYX - 2+ VIEW COMPARISON:  None Available. FINDINGS: There is no evidence of fracture or other focal bone lesions. IMPRESSION: Negative. Electronically Signed   By: Misty Stanley M.D.   On: 02/25/2022 17:09   CT Head Wo Contrast  Result Date: 02/25/2022 CLINICAL DATA:  Mental status changes.  Status post fall. EXAM: CT HEAD WITHOUT CONTRAST TECHNIQUE: Contiguous axial images were obtained from the base of the skull through the vertex without intravenous contrast. RADIATION DOSE  REDUCTION: This exam was performed according to the departmental dose-optimization program which includes automated exposure control, adjustment of the mA and/or kV according to patient size and/or use of iterative reconstruction technique. COMPARISON:  09/30/2015.  MRI brain 01/25/2022. FINDINGS: Brain: There is no evidence for acute hemorrhage, hydrocephalus, mass lesion, or abnormal extra-axial fluid collection. No definite CT evidence for acute infarction. Diffuse loss of parenchymal volume is consistent with atrophy. Symmetric marked low-attenuation the deep hemispheric white matter bilaterally is similar to recent MRI in this patient status post whole brain radiation for CNS lymphoma. Vascular: No hyperdense vessel or unexpected calcification. Skull: No evidence for fracture. No worrisome lytic or sclerotic lesion. Sinuses/Orbits: The visualized paranasal sinuses and mastoid air cells are clear. Visualized portions of the globes and intraorbital fat are unremarkable. Other: None. IMPRESSION: 1. No acute intracranial abnormality. 2. Symmetric marked low-attenuation the deep hemispheric white matter bilaterally is compatible with white matter changes seen on recent MRI in this patient status post whole brain radiation for CNS lymphoma. 3. Atrophy with chronic small vessel ischemic disease. Electronically Signed   By: Misty Stanley M.D.   On: 02/25/2022 16:38               LOS: 1 day   Vahan Wadsworth  [facility]   Pager on [email or web address]. If 7PM-7AM, please contact night-coverage at [email or web address]     02/26/2022, 9:49 AM

## 2022-02-26 NOTE — Evaluation (Signed)
Physical Therapy Evaluation Patient Details Name: Stacy Murillo MRN: [id number] DOB: 1955-10-11 Today's Date: 02/26/2022  History of Present Illness  Stacy Murillo is a 26yoF who comes to [facility] on 02/25/22 after fall at home leading to pain in the tailbone, she presents with increased weakness in legs progressed from baseline gait instability but typically able to AMB without device. Pt sustained a 2nd fall in the driveway without hitting her head.  Patient's primary care giver was her husband who is currently at [facility] s/p CABG. 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 Coccyx imaging unrevealing, Brain MRI showing chronic/remote changes only.  Clinical Impression  Evaluation performed with DTR in room who provides background information and context. Pt DTR, pt's mentation is not at baseline, albeit some baseline ST memory deficits, she remains generally altered at time of eval. Pt uses heavy effort to perform bed mobility and transfer to standing, requires modA physical assist, and continued cues to keep on task due to frequent distraction. Pt lacks good awareness of unsteadiness/imbalance while standing, ultimately requires minA to remain up, but does collapse back into a sitting position. Pt would need 24/7 supervision for safety at DC should she go directly to home. She would need physical assistance to perform basic mobility. Recommend STR interval for strength to return and mentation to clear up.      Recommendations for follow up therapy are one component of a multi-disciplinary discharge planning process, led by the attending physician.  Recommendations may be updated based on patient status, additional functional criteria and insurance authorization.  Follow Up Recommendations Skilled nursing-short term rehab (<3 hours/day) Can patient physically be transported by private vehicle: No    Assistance Recommended at Discharge  Frequent or constant Supervision/Assistance  Patient can return home with the following  A lot of help with walking and/or transfers;A little help with bathing/dressing/bathroom;Assistance with cooking/housework;Help with stairs or ramp for entrance    Equipment Recommendations None recommended by PT  Recommendations for Other Services       Functional Status Assessment Patient has had a recent decline in their functional status and demonstrates the ability to make significant improvements in function in a reasonable and predictable amount of time.     Precautions / Restrictions Precautions Precautions: Fall Restrictions Weight Bearing Restrictions: No      Mobility  Bed Mobility Overal bed mobility: Needs Assistance Bed Mobility: Supine to Sit, Sit to Supine     Supine to sit: Supervision Sit to supine: Supervision   General bed mobility comments: segmented, distracted,high effort, stops mid task frwequently, collapses backward a few times with head gentley contacting CL bed rail    Transfers Overall transfer level: Needs assistance   Transfers: Sit to/from Stand Sit to Stand: Mod assist           General transfer comment: no Anterior LOB; once assisted to standing has poor balance awareness/acquisition    Ambulation/Gait                  Stairs            Wheelchair Mobility    Modified Rankin (Stroke Patients Only)       Balance Overall balance assessment: History of Falls, Needs assistance         Standing balance support: Single extremity supported, During functional activity Standing balance-Leahy Scale: Zero  Pertinent Vitals/Pain Pain Assessment Pain Assessment: Faces Faces Pain Scale: Hurts little more Pain Location: tailbone pain when sitting EOB Pain Descriptors / Indicators: Aching, Burning Pain Intervention(s): Limited activity within patient's tolerance, Monitored during session     Home Living Family/patient expects to be discharged to:: Private residence Living Arrangements: Spouse/significant other (Just had CABG c sternotomy) Available Help at Discharge: Family (DTR lives local, 2 other family; 2 sons live in [place]) Type of Home: House         Home Layout: Full bath on main level;Able to live on main level with bedroom/bathroom;Other (Comment) (does not go upstairs typically) Home Equipment: Rolling Walker (2 wheels);Wheelchair - manual      Prior Function Prior Level of Function : Independent/Modified Independent (long-standing ST memory issues s/p CA treatments)             Mobility Comments: no device typically ADLs Comments: independent;     Hand Dominance        Extremity/Trunk Assessment                Communication      Cognition Arousal/Alertness: Awake/alert Behavior During Therapy: WFL for tasks assessed/performed Overall Cognitive Status: Impaired/Different from baseline (has ST memory deficits s/p CA treatment)                                          General Comments      Exercises     Assessment/Plan    PT Assessment Patient needs continued PT services  PT Problem List Decreased strength;Decreased cognition;Decreased range of motion;Decreased activity tolerance;Decreased balance;Decreased mobility;Decreased coordination       PT Treatment Interventions DME instruction;Balance training;Gait training;Stair training;Functional mobility training;Therapeutic activities;Therapeutic exercise;Patient/family education    PT Goals (Current goals can be found in the Care Plan section)  Acute Rehab PT Goals Patient Stated Goal: find a way for safe return to home to take place PT Goal Formulation: With family Time For Goal Achievement: 03/12/22 Potential to Achieve Goals: Good    Frequency Min 2X/week     Co-evaluation               AM-PAC PT "6 Clicks" Mobility  Outcome Measure Help needed  turning from your back to your side while in a flat bed without using bedrails?: A Lot Help needed moving from lying on your back to sitting on the side of a flat bed without using bedrails?: A Lot Help needed moving to and from a bed to a chair (including a wheelchair)?: A Lot Help needed standing up from a chair using your arms (e.g., wheelchair or bedside chair)?: A Lot Help needed to walk in hospital room?: Total Help needed climbing 3-5 steps with a railing? : Total 6 Click Score: 10    End of Session   Activity Tolerance: Patient tolerated treatment well;Patient limited by fatigue;Patient limited by pain Patient left: in bed;with family/visitor present;with call bell/phone within reach   PT Visit Diagnosis: Unsteadiness on feet (R26.81);Difficulty in walking, not elsewhere classified (R26.2);Other abnormalities of gait and mobility (R26.89);Repeated falls (R29.6)    Time: 9323-5573 PT Time Calculation (min) (ACUTE ONLY): 24 min   Charges:   PT Evaluation $PT Eval Moderate Complexity: 1 Mod PT Treatments $Therapeutic Activity: 8-22 mins      9:48 AM, 02/26/22 Etta Grandchild, PT, DPT Physical Therapist - [facility]  [phone] ([place])    Dakarri Kessinger C 02/26/2022, 9:44 AM

## 2022-02-26 NOTE — NC FL2 (Signed)
[place] LEVEL OF CARE SCREENING TOOL     IDENTIFICATION  Patient Name: Stacy Murillo Birthdate: 13-Mar-1956 Sex: female Admission Date (Current Location): 02/25/2022  [facility] and [place] Number:  Engineering geologist and Address:  [facility], [address] [place], [place] [address]      Provider Number: [id number]  Attending Physician Name and Address:  Jennye Boroughs, MD  Relative Name and Phone Number:  Broadus John [phone]    Current Level of Care: Hospital Recommended Level of Care: [place] Prior Approval Number:    Date Approved/Denied:   PASRR Number: [id number]  Discharge Plan: SNF    Current Diagnoses: Patient Active Problem List   Diagnosis Date Noted   Altered mental status 02/25/2022   Fall at home, initial encounter 02/25/2022   Hypokalemia 02/25/2022   Gait instability 02/25/2022   Elevated BP without diagnosis of hypertension 02/25/2022   Dehydration 02/25/2022   COVID-19 05/09/2021   Hypotension 03/25/2021   UTI (urinary tract infection) 03/16/2021   Mild cognitive impairment with memory loss 04/21/2020   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)  09/21/2015   Left knee pain 07/20/2014   Esophageal reflux 04/06/2014   Special screening for malignant neoplasms, colon 04/06/2014   Screening for breast cancer 07/10/2011   Osteopenia 07/06/2011   Vitamin D deficiency 07/06/2011   Hyperlipidemia 07/06/2011   Disorder of bone and cartilage 07/06/2011   Osteoporosis    Overweight 10/19/2008    Orientation RESPIRATION BLADDER Height & Weight     Time, Self, Situation, Place  Normal Continent Weight: 197 lb (89.4 kg) Height:  '5\' 2"'$  (157.5 cm)  BEHAVIORAL SYMPTOMS/MOOD NEUROLOGICAL BOWEL NUTRITION STATUS      Continent Diet (see discharge summary)  AMBULATORY STATUS COMMUNICATION OF NEEDS Skin   Limited Assist Verbally Normal                       Personal Care Assistance Level of Assistance  Bathing, Dressing, Total care, Feeding Bathing Assistance: Limited assistance Feeding assistance: Independent Dressing Assistance: Limited assistance Total Care Assistance: Limited assistance   Functional Limitations Info  Sight, Hearing, Speech Sight Info: Adequate Hearing Info: Adequate Speech Info: Adequate    SPECIAL CARE FACTORS FREQUENCY  PT (By licensed PT), OT (By licensed OT)     PT Frequency: min 4x week OT Frequency: min 4x week            Contractures Contractures Info: Not present    Additional Factors Info  Code Status, Allergies Code Status Info: full Allergies Info: Ondansetron Hcl   Oxycodone   Rituximab           Current Medications (02/26/2022):  This is the current hospital active medication list Current Facility-Administered Medications  Medication Dose Route Frequency  Provider Last Rate Last Admin   acetaminophen (TYLENOL) tablet 650 mg  650 mg Oral Q6H PRN Adefeso, Oladapo, DO       Or   acetaminophen (TYLENOL) suppository 650 mg  650 mg Rectal Q6H PRN Adefeso, Oladapo, DO       ALPRAZolam (XANAX) tablet 0.25 mg  0.25 mg Oral QHS PRN Adefeso, Oladapo, DO       cefTRIAXone (ROCEPHIN) 1 g  in sodium chloride 0.9 % 100 mL IVPB  1 g Intravenous Q24H Adefeso, Oladapo, DO   Stopped at 02/26/22 0230   enoxaparin (LOVENOX) injection 45 mg  0.5 mg/kg Subcutaneous Q24H Adefeso, Oladapo, DO       hydrALAZINE (APRESOLINE) injection 5 mg  5 mg Intravenous Q6H PRN Adefeso, Oladapo, DO       Current Outpatient Medications  Medication Sig Dispense Refill   midodrine (PROAMATINE) 5 MG tablet TAKE ONE AND ONE-HALF TABLETS THREE TIMES A DAY 180 tablet 1   acetaminophen (TYLENOL) 650 MG CR tablet Take 650 mg by mouth every 8 (eight) hours as needed for pain.     ALPRAZolam (XANAX) 0.25 MG tablet TAKE ONE TABLET AT BEDTIME AS NEEDED FORANXIETY 20 tablet 2   ondansetron (ZOFRAN-ODT) 4 MG disintegrating tablet Take 1 tablet (4 mg total) by mouth every 8 (eight) hours as needed for nausea or vomiting. (Patient not taking: Reported on 02/25/2022) 60 tablet 2   Facility-Administered Medications Ordered in Other Encounters  Medication Dose Route Frequency Provider Last Rate Last Admin   0.9 %  sodium chloride infusion   Intravenous Continuous Herring, Orville Govern, NP   Stopped at 10/14/15 1312   methotrexate (50 mg/ml) 6.3 g in sodium chloride 0.9 % 1,000 mL injection   Intravenous Once Charlaine Dalton R, MD       sodium chloride flush (NS) 0.9 % injection 10 mL  10 mL Intravenous PRN Cammie Sickle, MD   10 mL at 10/05/16 1400     Discharge Medications: Please see discharge summary for a list of discharge medications.  Relevant Imaging Results:  Relevant Lab Results:   Additional Information HEN:[id number]  Alberteen Sam, LCSW

## 2022-02-27 LAB — CBC
HCT: 41.4 % (ref 36.0–46.0)
Hemoglobin: 13.5 g/dL (ref 12.0–15.0)
MCH: 29.4 pg (ref 26.0–34.0)
MCHC: 32.6 g/dL (ref 30.0–36.0)
MCV: 90.2 fL (ref 80.0–100.0)
Platelets: 142 10*3/uL — ABNORMAL LOW (ref 150–400)
RBC: 4.59 MIL/uL (ref 3.87–5.11)
RDW: 13 % (ref 11.5–15.5)
WBC: 5.7 10*3/uL (ref 4.0–10.5)
nRBC: 0 % (ref 0.0–0.2)

## 2022-02-27 LAB — URINE CULTURE: Culture: NO GROWTH

## 2022-02-27 NOTE — Progress Notes (Addendum)
Progress Note    Stacy Murillo  WNU:[id number] DOB: 1955-07-22  DOA: 02/25/2022 PCP: Crecencio Mc, MD      Brief Narrative:    Medical records reviewed and are as summarized below:  Stacy Murillo is a 66 y.o. 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  She complained of pain in her tailbone after the fall.  Family also reported increasing confusion.  She has had a cough for about 2 days prior to admission and they believe she caught something from her brother who has been sick.  She took Delsym cough syrup at home because of her cough.  She also developed increasing urinary frequency and incontinence.      Assessment/Plan:   Principal Problem:   Altered mental status Active Problems:   DLBCL (diffuse large B cell lymphoma) (HCC)   Hypotension   Fall at home, initial encounter   Hypokalemia   Gait instability   Elevated BP without diagnosis of hypertension   Dehydration    Body mass index is 35.65 kg/m.   Acute toxic metabolic encephalopathy: Improved.  She appears to be back to baseline.  No acute abnormality on MRI brain.  S/p fall at home, low back pain, unsteady gait: X-ray of sacrum/coccyx was negative for fracture.  PT and OT recommended discharge to SNF.  Follow-up with social worker to assist with disposition.  Urine culture did not show any growth.  Discontinue IV ceftriaxone since UTI is unlikely..  Cough, rhinovirus upper respiratory tract infection: Chest x-ray was unremarkable.  Respiratory viral panel was positive for rhinovirus.  COVID test was negative.   Hypokalemia: Improved  Diffuse large B-cell lymphoma: Outpatient follow-up with oncologist.  Other comorbidities include sleep CKD  stage IIIa, anxiety, history of hypotension on midodrine   Diet Order             Diet Heart Room service appropriate? Yes; Fluid consistency: Thin  Diet effective now                            Consultants: None  Procedures: None    Medications:    enoxaparin (LOVENOX) injection  0.5 mg/kg Subcutaneous Q24H   Continuous Infusions:     Anti-infectives (From admission, onward)    Start     Dose/Rate Route Frequency Ordered Stop   02/25/22 2330  cefTRIAXone (ROCEPHIN) 1 g in sodium chloride 0.9 % 100 mL IVPB  Status:  Discontinued        1 g 200 mL/hr over 30 Minutes Intravenous Every 24 hours 02/25/22 2328 02/27/22 1347              Family Communication/Anticipated D/C date and plan/Code Status   DVT prophylaxis: SCDs Start: 02/26/22 0855     Code Status: Full Code  Family Communication: Patient was ok with Katharine Look staying at the bedside during this encounter. Disposition Plan: Plan to discharge SNF in 1 to 2 days   Status is: Inpatient Remains inpatient appropriate because: Awaiting placement to SNF       Subjective:   Interval events noted.  She has no complaints.  She feels better today.  Cough is better.  Katharine Look, friend, was at the bedside.  Objective:  Vitals:   02/27/22 1043 02/27/22 1128 02/27/22 1128 02/27/22 1147  BP: 100/60   128/76  Pulse: 95 91  87  Resp: (!) '25 13  18  '$ Temp: 99 F (37.2 C)   98.1 F (36.7 C)  TempSrc: Oral     SpO2: 95% 96%  96%  Weight:   88.4 kg   Height:   '5\' 2"'$  (1.575 m)    No data found.   Intake/Output Summary (Last 24 hours) at 02/27/2022 1403 Last data filed at 02/27/2022 0031 Gross per 24 hour  Intake 100 ml  Output --  Net 100 ml   Filed Weights   02/25/22 1551 02/27/22 1128  Weight: 89.4 kg 88.4 kg    Exam:   GEN: NAD SKIN: No rash EYES: EOMI ENT: MMM CV: RRR PULM: CTA B ABD: soft, ND, NT, +BS CNS: AAO x 3, non focal EXT: No edema or tenderness        Data Reviewed:   I have personally reviewed following labs and imaging studies:  Labs: Labs show the following:   Basic Metabolic Panel: Recent Labs  Lab 02/25/22 1602 02/26/22 1124  NA 136 138  K 3.4* 4.1  CL 101 105  CO2 25 24  GLUCOSE 118* 114*  BUN 11 11  CREATININE 1.20* 1.15*  CALCIUM 9.3 9.4  MG  --  2.1  PHOS  --  3.1   GFR Estimated Creatinine Clearance: 49 mL/min (A) (by C-G formula based on SCr of 1.15 mg/dL (H)). Liver Function Tests: Recent Labs  Lab 02/25/22 1602 02/26/22 1124  AST 28 23  ALT 19 15  ALKPHOS 79 67  BILITOT 1.0 0.9  PROT 7.1 6.5  ALBUMIN 4.6 3.9   No results for input(s): "LIPASE", "AMYLASE" in the last 168 hours. No results for input(s): "AMMONIA" in the last 168 hours. Coagulation profile No results for input(s): "INR", "PROTIME" in the last 168 hours.  CBC: Recent Labs  Lab 02/25/22 1602 02/26/22 1124 02/27/22 0536  WBC 9.1 6.1 5.7  NEUTROABS 7.4  --   --   HGB 13.6 13.1 13.5  HCT 40.9 39.1 41.4  MCV 89.3 88.5 90.2  PLT 155 143* 142*   Cardiac Enzymes: No results for input(s): "CKTOTAL", "CKMB", "CKMBINDEX", "TROPONINI" in the last 168 hours. BNP (last 3 results) No results for input(s): "PROBNP" in the last 8760 hours. CBG: No results for input(s): "GLUCAP" in the last 168 hours. D-Dimer: No results for input(s): "DDIMER" in the last 72 hours. Hgb A1c: No results for input(s): "HGBA1C" in the last 72 hours. Lipid Profile: No results for input(s): "CHOL", "HDL", "LDLCALC", "TRIG", "CHOLHDL", "LDLDIRECT" in the last 72 hours. Thyroid function studies: No results for input(s): "TSH", "T4TOTAL", "T3FREE", "THYROIDAB" in the last 72 hours.  Invalid input(s): "FREET3" Anemia work up: No results for input(s): "VITAMINB12", "FOLATE", "FERRITIN", "TIBC", "IRON", "RETICCTPCT" in the last 72 hours. Sepsis Labs: Recent Labs  Lab 02/25/22 1602 02/26/22 1124 02/27/22 0536  WBC 9.1 6.1 5.7     Microbiology Recent Results (from the past 240 hour(s))  Urine Culture     Status: None   Collection Time: 02/25/22  7:16 PM   Specimen: Urine, Catheterized  Result Value Ref Range Status   Specimen Description   Final    URINE, CATHETERIZED Performed at [facility], [address], [place], [place] [address]    Special Requests   Final    NONE Performed at [facility], [place],  [place] [address]    Culture   Final    NO GROWTH Performed at [facility], [place] [address], [place], [place] [address]    Report Status 02/27/2022 FINAL  Final  SARS Coronavirus 2 by RT PCR (hospital order, performed in [facility] hospital lab) *cepheid single result test* Anterior Nasal Swab     Status: None   Collection Time: 02/26/22 11:04 AM   Specimen: Anterior Nasal Swab  Result Value Ref Range Status   SARS Coronavirus 2 by RT PCR NEGATIVE NEGATIVE Final    Comment: (NOTE) SARS-CoV-2 target nucleic acids are NOT DETECTED.  The SARS-CoV-2 RNA is generally detectable in upper and lower respiratory specimens during the acute phase of infection. The lowest concentration of SARS-CoV-2 viral copies this assay can detect is 250 copies / mL. A negative result does not preclude SARS-CoV-2 infection and should not be used as the sole basis for treatment or other patient management decisions.  A negative result may occur with improper specimen collection / handling, submission of specimen other than nasopharyngeal swab, presence of viral mutation(s) within the areas targeted by this assay, and inadequate number of viral copies (<250 copies / mL). A negative result must be combined with clinical observations, patient history, and epidemiological information.  Fact Sheet for Patients:   https://www.patel.info/  Fact Sheet for Healthcare Providers: https://hall.com/  This test is not yet approved or  cleared by  the [place] FDA and has been authorized for detection and/or diagnosis of SARS-CoV-2 by FDA under an Emergency Use Authorization (EUA).  This EUA will remain in effect (meaning this test can be used) for the duration of the COVID-19 declaration under Section 564(b)(1) of the Act, 21 U.S.C. section 360bbb-3(b)(1), unless the authorization is terminated or revoked sooner.  Performed at [facility], [place], [place] [address]   Respiratory (~20 pathogens) panel by PCR     Status: Abnormal   Collection Time: 02/26/22 11:06 AM   Specimen: Nasopharyngeal Swab; Respiratory  Result Value Ref Range Status   Adenovirus NOT DETECTED NOT DETECTED Final   Coronavirus 229E NOT DETECTED NOT DETECTED Final    Comment: (NOTE) The Coronavirus on the Respiratory Panel, DOES NOT test for the novel  Coronavirus (2019 nCoV)    Coronavirus HKU1 NOT DETECTED NOT DETECTED Final   Coronavirus NL63 NOT DETECTED NOT DETECTED Final   Coronavirus OC43 NOT DETECTED NOT DETECTED Final   Metapneumovirus NOT DETECTED NOT DETECTED Final   Rhinovirus / Enterovirus DETECTED (A) NOT DETECTED Final   Influenza A NOT DETECTED NOT DETECTED Final   Influenza B NOT DETECTED NOT DETECTED Final   Parainfluenza Virus 1 NOT DETECTED NOT DETECTED Final   Parainfluenza Virus 2 NOT DETECTED NOT DETECTED Final   Parainfluenza Virus 3 NOT DETECTED NOT DETECTED Final   Parainfluenza Virus 4 NOT DETECTED NOT DETECTED Final   Respiratory Syncytial Virus NOT DETECTED NOT DETECTED Final   Bordetella pertussis NOT DETECTED NOT DETECTED Final   Bordetella Parapertussis NOT DETECTED NOT DETECTED Final   Chlamydophila pneumoniae NOT DETECTED NOT DETECTED Final   Mycoplasma pneumoniae NOT DETECTED NOT DETECTED Final    Comment: Performed at [facility], [address], [place], [place] [address]    Procedures and diagnostic studies:  MR Brain W and Wo Contrast  Result Date: 02/25/2022 CLINICAL  DATA:  Mental status change EXAM: MRI HEAD WITHOUT AND WITH CONTRAST TECHNIQUE: Multiplanar, multiecho pulse sequences of the brain and surrounding structures  were obtained without and with intravenous contrast. CONTRAST:  30m GADAVIST GADOBUTROL 1 MMOL/ML IV SOLN COMPARISON:  None Available. FINDINGS: Brain: No acute infarct, mass effect or extra-axial collection. Remote hemorrhage in the left frontal lobe. There is confluent hyperintense T2-weighted signal within the white matter. Generalized volume loss. The midline structures are normal. Vascular: Major flow voids are preserved. Skull and upper cervical spine: Normal calvarium and skull base. Visualized upper cervical spine and soft tissues are normal. Sinuses/Orbits:No paranasal sinus fluid levels or advanced mucosal thickening. No mastoid or middle ear effusion. Normal orbits. IMPRESSION: 1. No acute intracranial abnormality. 2. Remote hemorrhage in the left frontal lobe and findings of advanced chronic small vessel ischemia. Electronically Signed   By: KUlyses JarredM.D.   On: 02/25/2022 21:34   DG Chest 2 View  Result Date: 02/25/2022 CLINICAL DATA:  Weakness EXAM: CHEST - 2 VIEW COMPARISON:  Chest x-ray 06/02/2017 FINDINGS: Left chest port catheter tip projects over the brachiocephalic SVC junction. The heart size and mediastinal contours are within normal limits. Both lungs are clear. The visualized skeletal structures are unremarkable. IMPRESSION: No active cardiopulmonary disease. Electronically Signed   By: ARonney AstersM.D.   On: 02/25/2022 17:10   DG Sacrum/Coccyx  Result Date: 02/25/2022 CLINICAL DATA:  Tailbone pain after a fall. EXAM: SACRUM AND COCCYX - 2+ VIEW COMPARISON:  None Available. FINDINGS: There is no evidence of fracture or other focal bone lesions. IMPRESSION: Negative. Electronically Signed   By: EMisty StanleyM.D.   On: 02/25/2022 17:09   CT Head Wo Contrast  Result Date: 02/25/2022 CLINICAL DATA:  Mental status  changes.  Status post fall. EXAM: CT HEAD WITHOUT CONTRAST TECHNIQUE: Contiguous axial images were obtained from the base of the skull through the vertex without intravenous contrast. RADIATION DOSE REDUCTION: This exam was performed according to the departmental dose-optimization program which includes automated exposure control, adjustment of the mA and/or kV according to patient size and/or use of iterative reconstruction technique. COMPARISON:  09/30/2015.  MRI brain 01/25/2022. FINDINGS: Brain: There is no evidence for acute hemorrhage, hydrocephalus, mass lesion, or abnormal extra-axial fluid collection. No definite CT evidence for acute infarction. Diffuse loss of parenchymal volume is consistent with atrophy. Symmetric marked low-attenuation the deep hemispheric white matter bilaterally is similar to recent MRI in this patient status post whole brain radiation for CNS lymphoma. Vascular: No hyperdense vessel or unexpected calcification. Skull: No evidence for fracture. No worrisome lytic or sclerotic lesion. Sinuses/Orbits: The visualized paranasal sinuses and mastoid air cells are clear. Visualized portions of the globes and intraorbital fat are unremarkable. Other: None. IMPRESSION: 1. No acute intracranial abnormality. 2. Symmetric marked low-attenuation the deep hemispheric white matter bilaterally is compatible with white matter changes seen on recent MRI in this patient status post whole brain radiation for CNS lymphoma. 3. Atrophy with chronic small vessel ischemic disease. Electronically Signed   By: EMisty StanleyM.D.   On: 02/25/2022 16:38               LOS: 2 days   Zeidy Tayag  [facility]   Pager on [email or web address] If 7PM-7AM, please contact night-coverage at [email or web address]     02/27/2022, 2:03 PM

## 2022-02-28 DIAGNOSIS — R531 Weakness: Secondary | ICD-10-CM

## 2022-02-28 DIAGNOSIS — C8330 Diffuse large B-cell lymphoma, unspecified site: Secondary | ICD-10-CM

## 2022-02-28 LAB — BASIC METABOLIC PANEL
Anion gap: 6 (ref 5–15)
BUN: 19 mg/dL (ref 8–23)
CO2: 24 mmol/L (ref 22–32)
Calcium: 9.2 mg/dL (ref 8.9–10.3)
Chloride: 111 mmol/L (ref 98–111)
Creatinine, Ser: 1.19 mg/dL — ABNORMAL HIGH (ref 0.44–1.00)
GFR, Estimated: 50 mL/min — ABNORMAL LOW (ref >60)
Glucose, Bld: 104 mg/dL — ABNORMAL HIGH (ref 70–99)
Potassium: 4 mmol/L (ref 3.5–5.1)
Sodium: 141 mmol/L (ref 135–145)

## 2022-02-28 LAB — CBC
HCT: 38.3 % (ref 36.0–46.0)
Hemoglobin: 12.8 g/dL (ref 12.0–15.0)
MCH: 29.4 pg (ref 26.0–34.0)
MCHC: 33.4 g/dL (ref 30.0–36.0)
MCV: 87.8 fL (ref 80.0–100.0)
Platelets: 152 10*3/uL (ref 150–400)
RBC: 4.36 MIL/uL (ref 3.87–5.11)
RDW: 12.7 % (ref 11.5–15.5)
WBC: 4.8 10*3/uL (ref 4.0–10.5)
nRBC: 0 % (ref 0.0–0.2)

## 2022-02-28 MED ORDER — ALPRAZOLAM 0.25 MG PO TABS: 0.2500 mg | ORAL_TABLET | Freq: Every evening | ORAL | 0 refills | Status: AC | PRN

## 2022-02-28 NOTE — Care Management Important Message (Signed)
Important Message  Patient Details  Name: Rheana B [place] MRN: [id number] Date of Birth: 1955/08/10   Medicare Important Message Given:  N/A - LOS <3 / Initial given by admissions     Juliann Pulse A Jermia Rigsby 02/28/2022, 9:13 AM

## 2022-02-28 NOTE — TOC Transition Note (Signed)
Transition of Care [facility]) - CM/SW Discharge Note   Patient Details  Name: Stacy Murillo MRN: [id number] Date of Birth: [birth year]  [facility] CM/SW Contact:  Colen Darling, [place] Phone Number: 02/28/2022, 10:54 AM   Clinical Narrative:     SW spoke to family member [name] and [name] [phone] and informed them that the patient will be transferring to [facility] today. Patient will be assigned room 801. RN can call report to [place].  Final next level of care: Skilled Nursing Facility Barriers to Discharge: Barriers Resolved   Patient Goals and CMS Choice Patient states their goals for this hospitalization and ongoing recovery are:: to attend rehab CMS Medicare.gov Compare Post Acute Care list provided to:: Patient Choice offered to / list presented to : Patient  Discharge Placement PASRR number recieved: 02/28/22 Existing PASRR number confirmed : 02/28/22          Patient chooses bed at: [facility] [place]   Name of family member notified: [name] [phone]/GYKZL [phone] Patient and family notified of of transfer: 02/28/22  Discharge Plan and Services               [facility] SNF, EMS transport                      Social Determinants of Health (SDOH) Interventions     Readmission Risk Interventions     No data to display

## 2022-02-28 NOTE — Progress Notes (Signed)
   02/28/22 1336  Medical Necessity for Transport Certificate --- IF THIS TRANSPORT IS ROUND TRIP OR SCHEDULED AND REPEATED, A PHYSICIAN MUST COMPLETE THIS FORM  Transport from: Teacher, English as a foreign language) [facility]  Transport to (Location) Other (Comment)  Did the patient arrive from a [place], [place] or Group Home? No  Is this the closest appropriate facility? Yes  Date of Transport Service 02/28/22  Name of [facility]  Round Trip Transport? No  Reason for Transport Discharge  Is this a hospice patient? 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 1. Patient unable to get up from bed without assistance  AND  2. Unable to ambulate  AND  3. Unable to sit in a chair, including wheelchair. No  Q2 Could the patient be transported safely by other means of transportation (I.E., wheelchair van)? No  Q3 Please check any of the following conditions that apply at the time of transport: Risk of injury to self and/or others;Other (Comment) (lymphoma)  Electronic Signature Colen Darling  Credentials SW  Date Signed 02/28/22

## 2022-02-28 NOTE — Discharge Summary (Signed)
Physician Discharge Summary  Stacy Murillo ZTI:[id number] DOB: Feb 07, 1956 DOA: 02/25/2022  PCP: Crecencio Mc, MD  Admit date: 02/25/2022 Discharge date: 02/28/2022  Admitted From:home  Disposition:  SNF  Recommendations for Outpatient Follow-up:  Follow up with PCP in 1-2 weeks F/u w/ onco, Dr. Rogue Bussing, in 1-2 weeks   Home Health: no  Equipment/Devices:   Discharge Condition: stable  CODE STATUS: full Diet recommendation: Regular   Brief/Interim Summary: HPI was taken from Dr. Josephine Cables: Stacy Murillo is a 66 y.o. 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  At bedside, patient was somnolent, though easily arousable, but quickly goes back to sleep.  Most of the history was obtained from ED physician and son at bedside. Per son, patient complained of low back pain about 2 days ago, she was also given Delsym cough syrup due to having cough in the last 2 days.  Last night (10/14) patient was noted to develop increased urinary frequency with incontinence and it was difficult for her to make it to the bathroom.  This morning, she presents with increased weakness in legs (She has baseline gait instability, but was able to ambulate without any assistive device) and about 11:30 AM, she had a fall in the driveway without hitting her head.  Patient's primary care giver was her husband who is currently at [facility] s/p CABG.  She is currently being taken care of by her children.  Patient was reported to usually present with use altered mental status when she has UTI.  At baseline, patient was able to maintain normal conversation without any confusion.   ED Course:  In the emergency department, BP was elevated at 175/86, but other vital signs were within normal range.  Work-up in the ED showed normal  CBC and BMP except for potassium 3.4, creatinine 1.20 and blood glucose of 118.  Urinalysis was unimpressive for UTI. Chest x-ray showed no active cardiopulmonary disease Sacrum and coccyx x-ray showed no evidence of fracture or other focal bone lesions CT head without contrast showed no acute intracranial abnormality MRI head without and with contrast showed no acute intracranial abnormality but showed remote hemorrhage in the left frontal lobe and findings of advanced chronic small vessel ischemia. She was treated with Ativan 0.5 mg, IV hydration 500 mL was given.  Hospitalist was asked to admit patient for further evaluation and management.  As per Dr. Mal Misty: Stacy Murillo is a 66 y.o. 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  She complained of pain in her tailbone after the fall.  Family also reported increasing confusion.  She has had a cough for about 2 days prior to admission and they believe she caught something from her brother who has been sick.  She took Delsym cough syrup at home because of her cough.  She also developed increasing urinary frequency and incontinence.   As per Dr. Jimmye Norman 02/28/22: Pt was medically stable for d/c to SNF. For more information, please see previous progress/consult notes.   Discharge Diagnoses:  Principal Problem:   Altered mental status Active Problems:   DLBCL (diffuse large B cell lymphoma) (  [place])   Hypotension   Fall at home, initial encounter   Hypokalemia   Gait instability   Elevated BP without diagnosis of hypertension   Dehydration  Acute toxic metabolic encephalopathy:  no acute abnormality on MRI brain. Mental status is back to baseline   S/p fall at home: w/ low back pain,  unsteady gait. X-ray of sacrum/coccyx was negative for fracture. PT/OT recs SNF   Viral infection: of upper respiratory tract. Viral PCR panel was positive for rhinovirus/enterovirus.    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 Avoid nephrotoxic meds  Anxiety: severity unknown. Continue on home dose of xanax  Hx of hypotension: continue on home dose of midodrine  Obesity: BMI 35.6. Complicates overall care & prognosis   Discharge Instructions  Discharge Instructions     Diet general   Complete by: As directed    Discharge instructions   Complete by: As directed    F/u w/ PCP in 1-2 weeks. F/u w/ oncology in 1-2 weeks   Increase activity slowly   Complete by: As directed       Allergies as of 02/28/2022       Reactions   Ondansetron Hcl Other (See Comments)   Severe constipation    Oxycodone    Biliary colic with opioids s/p cholecystectomy     Rituximab Other (See Comments)   Pt reports throat tightness and facial swelling and redness during infusion of Rituxan        Medication List     TAKE these medications    acetaminophen 650 MG CR tablet Commonly known as: TYLENOL Take 650 mg by mouth every 8 (eight) hours as needed for pain.   ALPRAZolam 0.25 MG tablet Commonly known as: XANAX Take 1 tablet (0.25 mg total) by mouth at bedtime as needed for up to 2 days for sleep. What changed: See the new instructions.   midodrine 5 MG tablet Commonly known as: PROAMATINE TAKE ONE AND ONE-HALF TABLETS THREE TIMES A DAY   ondansetron 4 MG disintegrating tablet Commonly known as: ZOFRAN-ODT Take 1 tablet (4 mg total) by mouth every 8 (eight) hours as needed for nausea or vomiting.        Allergies  Allergen Reactions   Ondansetron Hcl Other (See Comments)    Severe constipation    Oxycodone     Biliary colic with opioids s/p cholecystectomy     Rituximab Other (See Comments)     Pt reports throat tightness and facial swelling and redness during infusion of Rituxan    Consultations:    Procedures/Studies: MR Brain W and Wo Contrast  Result Date: 02/25/2022 CLINICAL DATA:  Mental status change EXAM: MRI HEAD WITHOUT AND WITH CONTRAST TECHNIQUE: Multiplanar, multiecho pulse sequences of the brain and surrounding structures were obtained without and with intravenous contrast. CONTRAST:  27m GADAVIST GADOBUTROL 1 MMOL/ML IV SOLN COMPARISON:  None Available. FINDINGS: Brain: No acute infarct, mass effect or extra-axial collection. Remote hemorrhage in the left frontal lobe. There is confluent hyperintense T2-weighted signal within the white matter. Generalized volume loss. The midline structures are normal. Vascular: Major flow voids are preserved. Skull and upper cervical spine: Normal calvarium and skull base. Visualized upper cervical spine and soft tissues are normal. Sinuses/Orbits:No paranasal sinus fluid levels or advanced mucosal thickening. No mastoid or middle ear effusion. Normal orbits. IMPRESSION: 1. No acute intracranial abnormality. 2. Remote hemorrhage in the left frontal lobe  and findings of advanced chronic small vessel ischemia. Electronically Signed   By: Ulyses Jarred M.D.   On: 02/25/2022 21:34   DG Chest 2 View  Result Date: 02/25/2022 CLINICAL DATA:  Weakness EXAM: CHEST - 2 VIEW COMPARISON:  Chest x-ray 06/02/2017 FINDINGS: Left chest port catheter tip projects over the brachiocephalic SVC junction. The heart size and mediastinal contours are within normal limits. Both lungs are clear. The visualized skeletal structures are unremarkable. IMPRESSION: No active cardiopulmonary disease. Electronically Signed   By: Ronney Asters M.D.   On: 02/25/2022 17:10   DG Sacrum/Coccyx  Result Date: 02/25/2022 CLINICAL DATA:  Tailbone pain after a fall. EXAM: SACRUM AND COCCYX - 2+ VIEW COMPARISON:  None Available. FINDINGS: There is no evidence of fracture or other  focal bone lesions. IMPRESSION: Negative. Electronically Signed   By: Misty Stanley M.D.   On: 02/25/2022 17:09   CT Head Wo Contrast  Result Date: 02/25/2022 CLINICAL DATA:  Mental status changes.  Status post fall. EXAM: CT HEAD WITHOUT CONTRAST TECHNIQUE: Contiguous axial images were obtained from the base of the skull through the vertex without intravenous contrast. RADIATION DOSE REDUCTION: This exam was performed according to the departmental dose-optimization program which includes automated exposure control, adjustment of the mA and/or kV according to patient size and/or use of iterative reconstruction technique. COMPARISON:  09/30/2015.  MRI brain 01/25/2022. FINDINGS: Brain: There is no evidence for acute hemorrhage, hydrocephalus, mass lesion, or abnormal extra-axial fluid collection. No definite CT evidence for acute infarction. Diffuse loss of parenchymal volume is consistent with atrophy. Symmetric marked low-attenuation the deep hemispheric white matter bilaterally is similar to recent MRI in this patient status post whole brain radiation for CNS lymphoma. Vascular: No hyperdense vessel or unexpected calcification. Skull: No evidence for fracture. No worrisome lytic or sclerotic lesion. Sinuses/Orbits: The visualized paranasal sinuses and mastoid air cells are clear. Visualized portions of the globes and intraorbital fat are unremarkable. Other: None. IMPRESSION: 1. No acute intracranial abnormality. 2. Symmetric marked low-attenuation the deep hemispheric white matter bilaterally is compatible with white matter changes seen on recent MRI in this patient status post whole brain radiation for CNS lymphoma. 3. Atrophy with chronic small vessel ischemic disease. Electronically Signed   By: Misty Stanley M.D.   On: 02/25/2022 16:38   (Echo, Carotid, EGD, Colonoscopy, ERCP)    Subjective: Pt c/o fatigue    Discharge Exam: Vitals:   02/28/22 0853 02/28/22 1151  BP: (!) 119/58 107/60  Pulse:  70 78  Resp: 16 16  Temp: 98 F (36.7 C) 98.3 F (36.8 C)  SpO2:     Vitals:   02/28/22 0021 02/28/22 0532 02/28/22 0853 02/28/22 1151  BP: 135/71 137/63 (!) 119/58 107/60  Pulse: 93 80 70 78  Resp: '18 16 16 16  '$ Temp: 99.1 F (37.3 C) 98.4 F (36.9 C) 98 F (36.7 C) 98.3 F (36.8 C)  TempSrc: Oral Oral Oral Oral  SpO2: 96% 95%    Weight:      Height:        General: Pt is alert, awake, not in acute distress Cardiovascular: S1/S2 +, no rubs, no gallops Respiratory: CTA bilaterally, no wheezing, no rhonchi Abdominal: Soft, NT, obese, bowel sounds + Extremities: no cyanosis    The results of significant diagnostics from this hospitalization (including imaging, microbiology, ancillary and laboratory) are listed below for reference.     Microbiology: Recent Results (from the past 240 hour(s))  Urine Culture  Status: None   Collection Time: 02/25/22  7:16 PM   Specimen: Urine, Catheterized  Result Value Ref Range Status   Specimen Description   Final    URINE, CATHETERIZED Performed at [facility], [address], [place], [place] [address]    Special Requests   Final    NONE Performed at [facility], [address] [place], [place] [address]    Culture   Final    NO GROWTH Performed at [facility], [place] [address] [place], [place] [address]    Report Status 02/27/2022 FINAL  Final  SARS Coronavirus 2 by RT PCR (hospital order, performed in [facility] hospital lab) *cepheid single result test* Anterior Nasal Swab     Status: None   Collection Time: 02/26/22 11:04 AM   Specimen: Anterior Nasal Swab  Result Value Ref Range Status   SARS Coronavirus 2 by RT PCR NEGATIVE NEGATIVE Final    Comment: (NOTE) SARS-CoV-2 target nucleic acids are NOT DETECTED.  The SARS-CoV-2 RNA is generally detectable in upper and lower respiratory specimens during the acute phase of infection. The lowest concentration of SARS-CoV-2 viral copies this  assay can detect is 250 copies / mL. A negative result does not preclude SARS-CoV-2 infection and should not be used as the sole basis for treatment or other patient management decisions.  A negative result may occur with improper specimen collection / handling, submission of specimen other than nasopharyngeal swab, presence of viral mutation(s) within the areas targeted by this assay, and inadequate number of viral copies (<250 copies / mL). A negative result must be combined with clinical observations, patient history, and epidemiological information.  Fact Sheet for Patients:   https://www.patel.info/  Fact Sheet for Healthcare Providers: https://hall.com/  This test is not yet approved or  cleared by the [place] FDA and has been authorized for detection and/or diagnosis of SARS-CoV-2 by FDA under an Emergency Use Authorization (EUA).  This EUA will remain in effect (meaning this test can be used) for the duration of the COVID-19 declaration under Section 564(b)(1) of the Act, 21 U.S.C. section 360bbb-3(b)(1), unless the authorization is terminated or revoked sooner.  Performed at [facility], [place], [place] [address]   Respiratory (~20 pathogens) panel by PCR     Status: Abnormal   Collection Time: 02/26/22 11:06 AM   Specimen: Nasopharyngeal Swab; Respiratory  Result Value Ref Range Status   Adenovirus NOT DETECTED NOT DETECTED Final   Coronavirus 229E NOT DETECTED NOT DETECTED Final    Comment: (NOTE) The Coronavirus on the Respiratory Panel, DOES NOT test for the novel  Coronavirus (2019 nCoV)    Coronavirus HKU1 NOT DETECTED NOT DETECTED Final   Coronavirus NL63 NOT DETECTED NOT DETECTED Final   Coronavirus OC43 NOT DETECTED NOT DETECTED Final   Metapneumovirus NOT DETECTED NOT DETECTED Final   Rhinovirus / Enterovirus DETECTED (A) NOT DETECTED Final   Influenza A NOT DETECTED NOT DETECTED  Final   Influenza B NOT DETECTED NOT DETECTED Final   Parainfluenza Virus 1 NOT DETECTED NOT DETECTED Final   Parainfluenza Virus 2 NOT DETECTED NOT DETECTED Final   Parainfluenza Virus 3 NOT DETECTED NOT DETECTED Final   Parainfluenza Virus 4 NOT DETECTED NOT DETECTED Final   Respiratory Syncytial Virus NOT DETECTED NOT DETECTED Final   Bordetella pertussis NOT DETECTED NOT DETECTED Final   Bordetella Parapertussis NOT DETECTED NOT DETECTED Final   Chlamydophila pneumoniae NOT DETECTED NOT DETECTED Final  Mycoplasma pneumoniae NOT DETECTED NOT DETECTED Final    Comment: Performed at [facility], [place] [address], [place], [place] [address]     Labs: BNP (last 3 results) No results for input(s): "BNP" in the last 8760 hours. Basic Metabolic Panel: Recent Labs  Lab 02/25/22 1602 02/26/22 1124 02/28/22 0827  NA 136 138 141  K 3.4* 4.1 4.0  CL 101 105 111  CO2 '25 24 24  '$ GLUCOSE 118* 114* 104*  BUN '11 11 19  '$ CREATININE 1.20* 1.15* 1.19*  CALCIUM 9.3 9.4 9.2  MG  --  2.1  --   PHOS  --  3.1  --    Liver Function Tests: Recent Labs  Lab 02/25/22 1602 02/26/22 1124  AST 28 23  ALT 19 15  ALKPHOS 79 67  BILITOT 1.0 0.9  PROT 7.1 6.5  ALBUMIN 4.6 3.9   No results for input(s): "LIPASE", "AMYLASE" in the last 168 hours. No results for input(s): "AMMONIA" in the last 168 hours. CBC: Recent Labs  Lab 02/25/22 1602 02/26/22 1124 02/27/22 0536 02/28/22 0827  WBC 9.1 6.1 5.7 4.8  NEUTROABS 7.4  --   --   --   HGB 13.6 13.1 13.5 12.8  HCT 40.9 39.1 41.4 38.3  MCV 89.3 88.5 90.2 87.8  PLT 155 143* 142* 152   Cardiac Enzymes: No results for input(s): "CKTOTAL", "CKMB", "CKMBINDEX", "TROPONINI" in the last 168 hours. BNP: Invalid input(s): "POCBNP" CBG: No results for input(s): "GLUCAP" in the last 168 hours. D-Dimer No results for input(s): "DDIMER" in the last 72 hours. Hgb A1c No results for input(s): "HGBA1C" in the last 72 hours. Lipid Profile No  results for input(s): "CHOL", "HDL", "LDLCALC", "TRIG", "CHOLHDL", "LDLDIRECT" in the last 72 hours. Thyroid function studies No results for input(s): "TSH", "T4TOTAL", "T3FREE", "THYROIDAB" in the last 72 hours.  Invalid input(s): "FREET3" Anemia work up No results for input(s): "VITAMINB12", "FOLATE", "FERRITIN", "TIBC", "IRON", "RETICCTPCT" in the last 72 hours. Urinalysis    Component Value Date/Time   COLORURINE YELLOW (A) 02/25/2022 1916   APPEARANCEUR CLEAR (A) 02/25/2022 1916   LABSPEC 1.013 02/25/2022 1916   PHURINE 5.0 02/25/2022 1916   GLUCOSEU NEGATIVE 02/25/2022 1916   GLUCOSEU NEGATIVE 03/13/2021 1408   HGBUR MODERATE (A) 02/25/2022 1916   BILIRUBINUR NEGATIVE 02/25/2022 1916   KETONESUR 5 (A) 02/25/2022 1916   PROTEINUR 30 (A) 02/25/2022 1916   UROBILINOGEN 0.2 03/13/2021 1408   NITRITE NEGATIVE 02/25/2022 1916   LEUKOCYTESUR NEGATIVE 02/25/2022 1916   Sepsis Labs Recent Labs  Lab 02/25/22 1602 02/26/22 1124 02/27/22 0536 02/28/22 0827  WBC 9.1 6.1 5.7 4.8   Microbiology Recent Results (from the past 240 hour(s))  Urine Culture     Status: None   Collection Time: 02/25/22  7:16 PM   Specimen: Urine, Catheterized  Result Value Ref Range Status   Specimen Description   Final    URINE, CATHETERIZED Performed at [facility], [address], [place], [name] [address]    Special Requests   Final    NONE Performed at [facility], [address] [place], [place] [address]    Culture   Final    NO GROWTH Performed at [facility], [place] [address] [place], [place] [address]    Report Status 02/27/2022 FINAL  Final  SARS Coronavirus 2 by RT PCR (hospital order, performed in [facility] hospital lab) *cepheid single result test* Anterior Nasal Swab     Status: None  Collection Time: 02/26/22 11:04 AM   Specimen: Anterior Nasal Swab  Result Value Ref Range Status   SARS Coronavirus 2 by RT PCR NEGATIVE NEGATIVE Final     Comment: (NOTE) SARS-CoV-2 target nucleic acids are NOT DETECTED.  The SARS-CoV-2 RNA is generally detectable in upper and lower respiratory specimens during the acute phase of infection. The lowest concentration of SARS-CoV-2 viral copies this assay can detect is 250 copies / mL. A negative result does not preclude SARS-CoV-2 infection and should not be used as the sole basis for treatment or other patient management decisions.  A negative result may occur with improper specimen collection / handling, submission of specimen other than nasopharyngeal swab, presence of viral mutation(s) within the areas targeted by this assay, and inadequate number of viral copies (<250 copies / mL). A negative result must be combined with clinical observations, patient history, and epidemiological information.  Fact Sheet for Patients:   https://www.patel.info/  Fact Sheet for Healthcare Providers: https://hall.com/  This test is not yet approved or  cleared by the [place] FDA and has been authorized for detection and/or diagnosis of SARS-CoV-2 by FDA under an Emergency Use Authorization (EUA).  This EUA will remain in effect (meaning this test can be used) for the duration of the COVID-19 declaration under Section 564(b)(1) of the Act, 21 U.S.C. section 360bbb-3(b)(1), unless the authorization is terminated or revoked sooner.  Performed at [facility], [place], [place] [address]   Respiratory (~20 pathogens) panel by PCR     Status: Abnormal   Collection Time: 02/26/22 11:06 AM   Specimen: Nasopharyngeal Swab; Respiratory  Result Value Ref Range Status   Adenovirus NOT DETECTED NOT DETECTED Final   Coronavirus 229E NOT DETECTED NOT DETECTED Final    Comment: (NOTE) The Coronavirus on the Respiratory Panel, DOES NOT test for the novel  Coronavirus (2019 nCoV)    Coronavirus HKU1 NOT DETECTED NOT DETECTED Final    Coronavirus NL63 NOT DETECTED NOT DETECTED Final   Coronavirus OC43 NOT DETECTED NOT DETECTED Final   Metapneumovirus NOT DETECTED NOT DETECTED Final   Rhinovirus / Enterovirus DETECTED (A) NOT DETECTED Final   Influenza A NOT DETECTED NOT DETECTED Final   Influenza B NOT DETECTED NOT DETECTED Final   Parainfluenza Virus 1 NOT DETECTED NOT DETECTED Final   Parainfluenza Virus 2 NOT DETECTED NOT DETECTED Final   Parainfluenza Virus 3 NOT DETECTED NOT DETECTED Final   Parainfluenza Virus 4 NOT DETECTED NOT DETECTED Final   Respiratory Syncytial Virus NOT DETECTED NOT DETECTED Final   Bordetella pertussis NOT DETECTED NOT DETECTED Final   Bordetella Parapertussis NOT DETECTED NOT DETECTED Final   Chlamydophila pneumoniae NOT DETECTED NOT DETECTED Final   Mycoplasma pneumoniae NOT DETECTED NOT DETECTED Final    Comment: Performed at [facility], [address] [place], [place] [address]     Time coordinating discharge: Over 30 minutes  SIGNED:   Wyvonnia Dusky, MD  [facility] 02/28/2022, 12:08 PM Pager   If 7PM-7AM, please contact night-coverage [email or web address]

## 2022-02-28 NOTE — Progress Notes (Signed)
Occupational Therapy Treatment Patient Details Name: Stacy Murillo MRN: [id number] DOB: Apr 01, 1956 Today's Date: 02/28/2022   History of present illness Stacy Murillo is a 70yoF who comes to [facility] on 02/25/22 after fall at home leading to pain in the tailbone, she presents with increased weakness in legs progressed from baseline gait instability but typically able to AMB without device. Pt sustained a 2nd fall in the driveway without hitting her head.  Patient's primary care giver was her husband who is currently at [facility] s/p CABG. 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 Coccyx imaging unrevealing, Brain MRI showing chronic/remote changes only.   OT comments  Chart reviewed, pt greeted in room with cousin present. Per pt and cousin pt continues to be altered cognitively. Tx session targeted improving functional activity tolerance. Improvements noted in ability to amb approx 15' with RW, step by step vcs required for safety. Grooming tasks completed at sink level with supervision-CGA. Pt is left as received, all needs met. Discharge recommendation remains appropriate.     Recommendations for follow up therapy are one component of a multi-disciplinary discharge planning process, led by the attending physician.  Recommendations may be updated based on patient status, additional functional criteria and insurance authorization.    Follow Up Recommendations  Skilled nursing-short term rehab (<3 hours/day)    Assistance Recommended at Discharge Frequent or constant Supervision/Assistance  Patient can return home with the following  A lot of help with walking and/or transfers;A lot of help with bathing/dressing/bathroom;Help with stairs or ramp for entrance   Equipment Recommendations  BSC/3in1    Recommendations for Other Services      Precautions / Restrictions Precautions Precautions: Fall Restrictions Weight Bearing  Restrictions: No       Mobility Bed Mobility Overal bed mobility: Needs Assistance Bed Mobility: Supine to Sit, Sit to Supine     Supine to sit: Supervision Sit to supine: Supervision        Transfers Overall transfer level: Needs assistance Equipment used: Rolling walker (2 wheels) Transfers: Sit to/from Stand Sit to Stand: Min guard, Min assist           General transfer comment: 2 attempts     Balance Overall balance assessment: History of Falls, Needs assistance Sitting-balance support: No upper extremity supported, Feet supported       Standing balance support: Bilateral upper extremity supported Standing balance-Leahy Scale: Fair                             ADL either performed or assessed with clinical judgement   ADL Overall ADL's : Needs assistance/impaired     Grooming: Brushing hair;Standing;Supervision/safety               Lower Body Dressing: Minimal assistance;Sit to/from stand Lower Body Dressing Details (indicate cue type and reason): vcs for safety, attention to task Toilet Transfer: Min guard;Rolling walker (2 wheels) Toilet Transfer Details (indicate cue type and reason): simulated, step by step vcs for safety Toileting- Clothing Manipulation and Hygiene: Min guard;Sit to/from stand Toileting - Clothing Manipulation Details (indicate cue type and reason): step by step vcs for safety     Functional mobility during ADLs: Min guard;Minimal assistance;Rolling walker (2 wheels) (approx 15' in room, step by step vcs for safety)      Extremity/Trunk Assessment              Vision Patient Visual Report: No  change from baseline     Perception     Praxis      Cognition Arousal/Alertness: Awake/alert Behavior During Therapy: WFL for tasks assessed/performed Overall Cognitive Status: Impaired/Different from baseline Area of Impairment: Orientation, Following commands, Safety/judgement, Awareness, Problem solving                  Orientation Level: Disoriented to, Time, Situation     Following Commands: Follows one step commands inconsistently Safety/Judgement: Decreased awareness of safety, Decreased awareness of deficits Awareness: Emergent Problem Solving: Requires verbal cues, Requires tactile cues          Exercises      Shoulder Instructions       General Comments      Pertinent Vitals/ Pain       Pain Assessment Pain Assessment: Faces Faces Pain Scale: Hurts little more Pain Location: sacral pain Pain Descriptors / Indicators: Aching, Burning Pain Intervention(s): Limited activity within patient's tolerance, Repositioned, Monitored during session  Home Living                                          Prior Functioning/Environment              Frequency  Min 2X/week        Progress Toward Goals  OT Goals(current goals can now be found in the care plan section)  Progress towards OT goals: Progressing toward goals     Plan Discharge plan remains appropriate    Co-evaluation                 AM-PAC OT "6 Clicks" Daily Activity     Outcome Measure   Help from another person eating meals?: None Help from another person taking care of personal grooming?: A Little Help from another person toileting, which includes using toliet, bedpan, or urinal?: A Little Help from another person bathing (including washing, rinsing, drying)?: A Lot Help from another person to put on and taking off regular upper body clothing?: A Little Help from another person to put on and taking off regular lower body clothing?: A Little 6 Click Score: 18    End of Session Equipment Utilized During Treatment: Gait belt;Rolling walker (2 wheels)  OT Visit Diagnosis: Other abnormalities of gait and mobility (R26.89);Muscle weakness (generalized) (M62.81)   Activity Tolerance Patient tolerated treatment well   Patient Left in bed;with call bell/phone within  reach;with bed alarm set;with family/visitor present   Nurse Communication Mobility status        Time: 1100-1116 OT Time Calculation (min): 16 min  Charges: OT General Charges $OT Visit: 1 Visit OT Treatments $Self Care/Home Management : 8-22 mins  Shanon Payor, OTD OTR/L  02/28/22, 12:11 PM

## 2022-03-04 ENCOUNTER — Encounter: Payer: Self-pay | Admitting: Internal Medicine

## 2022-03-07 NOTE — Telephone Encounter (Signed)
Please schedule a virtual visit or phone call as per mychart request from [place] husband

## 2022-03-07 NOTE — Telephone Encounter (Signed)
Do we need to wait for the pt to get out of [facility] to schedule an appointment or do should I go ahead and schedule one with pt's husband?

## 2022-03-07 NOTE — Telephone Encounter (Signed)
Appt has been scheduled for next week. Pt's husband is aware.

## 2022-03-09 ENCOUNTER — Telehealth: Payer: Self-pay | Admitting: Internal Medicine

## 2022-03-09 NOTE — Telephone Encounter (Signed)
Would it be possible to set this up as a zoom meeting. Two of the participants will be in [name]. We can do a telephone conference but felt zoom may be easier.  [address], Background  [name], Stacy Murillo [phone] Yesterday (6:04 AM)  Stacy Murillo [name]  P [facility] [employer] (supporting Mychart, Generic) 2 days ago   This time works for [place].  Stacy Murillo   M[place], Generic  Stacy Murillo [name] 2 days ago  [employer] Information:     Visit Type: TELEPHONE OFFICE VISIT         Date: 03/13/2022                 Dept: [place]                 Provider: Crecencio Mc                 Time: 4:30 PM                 Length: 30 min   Appt Status: Scheduled

## 2022-03-12 NOTE — Telephone Encounter (Signed)
Pt husband called back and I relayed the message to him and he gave all of the children phone numbers to send a link to for the virtual appointment tomorrow [phone] [phone] [phone]

## 2022-03-12 NOTE — Telephone Encounter (Signed)
LMTCB

## 2022-03-13 ENCOUNTER — Telehealth: Payer: Medicare Other | Admitting: Internal Medicine

## 2022-03-13 ENCOUNTER — Encounter: Payer: Self-pay | Admitting: Internal Medicine

## 2022-03-13 DIAGNOSIS — R4189 Other symptoms and signs involving cognitive functions and awareness: Secondary | ICD-10-CM | POA: Insufficient documentation

## 2022-03-13 HISTORY — DX: Other symptoms and signs involving cognitive functions and awareness: R41.89

## 2022-03-13 NOTE — Assessment & Plan Note (Signed)
Secondary to viral infection, otc medications,  Superimposed on loss of memory and executive function resulting from brain irradiation and intrathecal chemotherapy .  She was hospitalized and transferred to SNF for rehab.  She will return home soon from SNF and may require home health .  Discussed referral to neurology ,  Which she has deferred in the past..  Advised Dr. Rogue Bussing that patient will accept referral to Dr Mickeal Skinner.

## 2022-03-13 NOTE — Progress Notes (Signed)
Virtual Visit via [place]   Note    This format is felt to be most appropriate for this patient at this time.  All issues noted in this document were discussed and addressed.  No physical exam was performed (except for noted visual exam findings with Video Visits).   I connected withNAME on 03/13/22 at  4:30 PM EDT by a video enabled telemedicine application  and verified that I am speaking with the correct person using two identifiers. Location patient: home Location provider: work or home office Persons participating in the virtual visit:  provider,  patient's husband and patient's 4 children   I discussed the limitations, risks, security and privacy concerns of performing an evaluation and management service by telephone and the availability of in person appointments. I also discussed with the patient that there may be a patient responsible charge related to this service. The patient expressed understanding and agreed to proceed.   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 S/p WBRT; MRI - 01/25/2022- No evidence for residual or recurrent lymphoma; Stable atrophy and diffuse white matter disease consistent  treatment is considered palliative .  Memory and executive function have been impaired since treatment'  She was hospitalized at [facility] from Oct 5 to Oct 18 after a decompensation at home during a viral infection .  She became delirious,  incontinent and unable to ambulate.  During admission an MRI was done which was negative for acute changes.     She was discharged to SNF [facility])  and per family is scheduled to return  home on  Sunday.  Her husband, who underwent triple coronary bypass on October 11,  plans to have paid aides to assist with her return home if needed,  and an appointment with her oncologist Dr Rogue Bussing is scheduled for early next week   Her family had concerns about her chance of cognitive recovery  any future insults to her intellectual state, and  is requesting neuropsychiatric referral to improve her changes of cognitive recovery    Past Medical History:  Diagnosis Date   Arthritis    Cancer ([place])    Diverticulosis 07/06/15   Dysrhythmia    tachycardia   Esophagitis 07/06/15   Fatigue    Gastritis 07/06/15   GERD (gastroesophageal reflux disease)    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   Jugular vein occlusion, 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   Leg cramps    Lymphoma ([place]) 09/14/15   Monoclonal B cell lymphoma   Night sweats    Osteoporosis    Overweight(278.02)    Obesity   Palpitations    Shortness of breath dyspnea    Tachycardia    Thrombocytopenia ([place])     Past Surgical History:  Procedure Laterality Date   ABDOMINAL HYSTERECTOMY  2005   BONE MARROW BIOPSY  09/14/15   CHOLECYSTECTOMY  1997   COLONOSCOPY  07/05/2014   ESOPHAGOGASTRODUODENOSCOPY  07/05/2014   INGUINAL LYMPH NODE BIOPSY Right 10/05/2015   Procedure: INGUINAL LYMPH NODE BIOPSY;  Surgeon: Christene Lye, MD;  Location: [facility] ORS;  Service: General;  Laterality: Right;   PERIPHERAL VASCULAR CATHETERIZATION N/A 09/27/2015   Procedure:  Porta Cath Insertion;  Surgeon: Algernon Huxley, MD;  Location: [place] [facility];  Service: Cardiovascular;  Laterality: N/A;   PORTA CATH REMOVAL Right 06/03/2017   Procedure: PORTA CATH REMOVAL, with venous thrombectomy;  Surgeon: Algernon Huxley, MD;  Location: [place] [facility];  Service: Cardiovascular;  Laterality: Right;   PORTACATH PLACEMENT Right     Family History  Problem Relation Age of Onset   Heart disease Father        CABG x 4   Multiple myeloma Father    Hypertension  Mother    Pancreatic cancer Mother    Ulcers Mother    Asthma Mother    Brain cancer Maternal Uncle    Multiple myeloma Maternal Uncle    Diabetes Neg Hx    Breast cancer Neg Hx     SOCIAL HX:  reports that she has never smoked. She has never used smokeless tobacco. She reports that she does not drink alcohol and does not use drugs.    Current Outpatient Medications:    acetaminophen (TYLENOL) 650 MG CR tablet, Take 650 mg by mouth every 8 (eight) hours as needed for pain., Disp: , Rfl:    midodrine (PROAMATINE) 5 MG tablet, TAKE ONE AND ONE-HALF TABLETS THREE TIMES A DAY, Disp: 180 tablet, Rfl: 1   ondansetron (ZOFRAN-ODT) 4 MG disintegrating tablet, Take 1 tablet (4 mg total) by mouth every 8 (eight) hours as needed for nausea or vomiting. (Patient not taking: Reported on 02/25/2022), Disp: 60 tablet, Rfl: 2 No current facility-administered medications for this visit.  Facility-Administered Medications Ordered in Other Visits:    0.9 %  sodium chloride infusion, , Intravenous, Continuous, Herring, Orville Govern, NP, Stopped at 10/14/15 1312   methotrexate (50 mg/ml) 6.3 g in sodium chloride 0.9 % 1,000 mL injection, , Intravenous, Once, Brahmanday, Lenetta Quaker R, MD   sodium chloride flush (NS) 0.9 % injection 10 mL, 10 mL, Intravenous, PRN, Charlaine Dalton R, MD, 10 mL at 10/05/16 1400     ASSESSMENT AND PLAN:  Discussed the following assessment and plan:  Cognitive changes  Cognitive changes Secondary to viral infection, otc medications,  Superimposed on loss of memory and executive function resulting from brain irradiation and intrathecal chemotherapy .  She was hospitalized and transferred to SNF for rehab.  She will return home soon from SNF and may require home health .  Discussed referral to neurology ,  Which she has deferred in the past..  Advised Dr. Rogue Bussing that patient will accept referral to Dr Mickeal Skinner.      I spent 40 minutes dedicated to the care of this patient on  the date of this encounter  in a video visit with patient's husband and children  which included a  review of her medical history,  recent hospitalization,  MRI and communication with her oncologist .    Crecencio Mc, MD

## 2022-03-15 ENCOUNTER — Telehealth: Payer: Self-pay | Admitting: Internal Medicine

## 2022-03-15 NOTE — Telephone Encounter (Signed)
Spoke to pt's husband re: his concerns about his wife/recent hospitalization.  Cancel the appointment- for 11/14.  Re-schedule the appts to 11/17-Friday: MD; labs-cbc/cmp;LDH; port flush  Also on 11/17- appt with Dr.vaslow re: Hx of brain lymphoma/cognitive deficits- please coordinate both the appts.   Thanks GB  FYI-Dr.Tullo.

## 2022-03-15 NOTE — Telephone Encounter (Signed)
Pt husband called and stated the pt could not come on Monday 11/6. I rescheduled the appt to next available which was on 11/14. Husband asked if that was ok or if she needed to be seen sooner. He also stated that their daughter knows more information regarding her hospitalization and requests that she joins in at the appointment alongside him. He stated he does use her mychart and tried to message [place] but it would not allow him to. I told him someone would either message him in [place] or give him a call with an answer.

## 2022-03-19 ENCOUNTER — Inpatient Hospital Stay: Payer: Medicare Other | Admitting: Internal Medicine

## 2022-03-20 ENCOUNTER — Encounter: Payer: Self-pay | Admitting: Nurse Practitioner

## 2022-03-20 NOTE — Telephone Encounter (Signed)
Has been scheduled for APP on 11/17.  Dr. Renda Rolls first available scheduled for 12/15.  APP could speak to Dr. Mickeal Skinner after 11/17 to discuss if need to be seen sooner.

## 2022-03-27 ENCOUNTER — Inpatient Hospital Stay: Payer: Medicare Other | Admitting: Nurse Practitioner

## 2022-03-30 ENCOUNTER — Inpatient Hospital Stay: Payer: Medicare Other

## 2022-03-30 ENCOUNTER — Telehealth: Payer: Self-pay

## 2022-03-30 ENCOUNTER — Encounter: Payer: Self-pay | Admitting: Nurse Practitioner

## 2022-03-30 ENCOUNTER — Inpatient Hospital Stay: Payer: Medicare Other | Attending: Internal Medicine | Admitting: Nurse Practitioner

## 2022-03-30 VITALS — BP 106/74 | HR 79 | Temp 97.1°F | Wt 190.0 lb

## 2022-03-30 DIAGNOSIS — Z95828 Presence of other vascular implants and grafts: Secondary | ICD-10-CM | POA: Diagnosis not present

## 2022-03-30 DIAGNOSIS — W19XXXS Unspecified fall, sequela: Secondary | ICD-10-CM

## 2022-03-30 DIAGNOSIS — Z9221 Personal history of antineoplastic chemotherapy: Secondary | ICD-10-CM | POA: Diagnosis not present

## 2022-03-30 DIAGNOSIS — Z08 Encounter for follow-up examination after completed treatment for malignant neoplasm: Secondary | ICD-10-CM | POA: Diagnosis not present

## 2022-03-30 DIAGNOSIS — Z8579 Personal history of other malignant neoplasms of lymphoid, hematopoietic and related tissues: Secondary | ICD-10-CM | POA: Diagnosis not present

## 2022-03-30 DIAGNOSIS — Z923 Personal history of irradiation: Secondary | ICD-10-CM | POA: Diagnosis not present

## 2022-03-30 DIAGNOSIS — R4189 Other symptoms and signs involving cognitive functions and awareness: Secondary | ICD-10-CM

## 2022-03-30 DIAGNOSIS — C8288 Other types of follicular lymphoma, lymph nodes of multiple sites: Secondary | ICD-10-CM | POA: Insufficient documentation

## 2022-03-30 DIAGNOSIS — C8583 Other specified types of non-Hodgkin lymphoma, intra-abdominal lymph nodes: Secondary | ICD-10-CM

## 2022-03-30 LAB — LACTATE DEHYDROGENASE: LDH: 132 U/L (ref 98–192)

## 2022-03-30 LAB — CBC WITH DIFFERENTIAL/PLATELET
Abs Immature Granulocytes: 0.15 10*3/uL — ABNORMAL HIGH (ref 0.00–0.07)
Basophils Absolute: 0.1 10*3/uL (ref 0.0–0.1)
Basophils Relative: 1 %
Eosinophils Absolute: 0 10*3/uL (ref 0.0–0.5)
Eosinophils Relative: 1 %
HCT: 41.7 % (ref 36.0–46.0)
Hemoglobin: 13.7 g/dL (ref 12.0–15.0)
Immature Granulocytes: 2 %
Lymphocytes Relative: 12 %
Lymphs Abs: 0.9 10*3/uL (ref 0.7–4.0)
MCH: 29.9 pg (ref 26.0–34.0)
MCHC: 32.9 g/dL (ref 30.0–36.0)
MCV: 91 fL (ref 80.0–100.0)
Monocytes Absolute: 0.6 10*3/uL (ref 0.1–1.0)
Monocytes Relative: 8 %
Neutro Abs: 5.6 10*3/uL (ref 1.7–7.7)
Neutrophils Relative %: 76 %
Platelets: 175 10*3/uL (ref 150–400)
RBC: 4.58 MIL/uL (ref 3.87–5.11)
RDW: 12.8 % (ref 11.5–15.5)
WBC: 7.4 10*3/uL (ref 4.0–10.5)
nRBC: 0 % (ref 0.0–0.2)

## 2022-03-30 LAB — COMPREHENSIVE METABOLIC PANEL
ALT: 12 U/L (ref 0–44)
AST: 19 U/L (ref 15–41)
Albumin: 4.1 g/dL (ref 3.5–5.0)
Alkaline Phosphatase: 72 U/L (ref 38–126)
Anion gap: 11 (ref 5–15)
BUN: 13 mg/dL (ref 8–23)
CO2: 25 mmol/L (ref 22–32)
Calcium: 9.4 mg/dL (ref 8.9–10.3)
Chloride: 102 mmol/L (ref 98–111)
Creatinine, Ser: 1.35 mg/dL — ABNORMAL HIGH (ref 0.44–1.00)
GFR, Estimated: 43 mL/min — ABNORMAL LOW (ref >60)
Glucose, Bld: 135 mg/dL — ABNORMAL HIGH (ref 70–99)
Potassium: 3.9 mmol/L (ref 3.5–5.1)
Sodium: 138 mmol/L (ref 135–145)
Total Bilirubin: 0.7 mg/dL (ref 0.3–1.2)
Total Protein: 6.6 g/dL (ref 6.5–8.1)

## 2022-03-30 NOTE — Progress Notes (Signed)
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 CT- 1-2 CM LN subpectoral/medistinal/ retro-peritoneal/pelvic/ Right inguinal LN 1.6cm- Bx- DLBCL- ABC; myc-POS; FISH gene re-arragement-NEG. PET- MULTIPLE LN/ Bone involvement; R-CHOP x6- CR'# OCT 2017- PET scan- CR; DEC 22nd 2017- BMBx- "atypical large B cells- <1%  [[facility]; II opinion- Dr.Grover- review of path- not concerning for residual lymphoma]; recommend surviellance  # March 26th  2018PET- NED  # Lumbar puncture- difficult-spinal headache. S/p high dose MXT x4.  ------------------------------------------------------------------- # NOV 2018-recurrent diffuse large B-cell lymphoma [right axilla lymph node biopsy proven]; PET- multiple bone lesions; axillary adenopathy; splenomegaly uptake  # Nov 16th 2018- R-; s/p 3 cycles-CR' Auto-Stem cell Transplant date: 07/11/17 [facility]; Dr.Vincent]; May 1st 2019- PET CR.   # OCT 2020- MASS  spanning the corpus callosum. Brain Biopsy  at [facility]-DLBCL. CD20+. EBV -, Ki67+, Pax5+. Port placed 10/27, R-MPV started 10/27 [facility]; Dr.Grover]. s/p WBRT ; MARCH 31st- MRI- remission/Stable disease   ---------------------------------------------------------- # June 2017-Elevated LFTs- valacylovir/diflucan; resolved.   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   # May 2017- EF- 55-65%  #August 2019-double vision; secondary to refractive error-prism improved. 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 In the interim, she had a fall at home and was seen in hospital. She does not have any recollection of these events. She was diagnosed with URI and was discharged to rehab/[facility]. She has now returned home. Per husband, she is back to her pre-fall baseline but not yet to baseline post treatment for [facility]. Husband is concerned about ongoing difficulty with memory, decision making, and executive functions. Fall occurred when her husband was in the hospital but she had a caregiver. She is independent of ADLs and does not feel she needs PT or OT. Is eager to try to improve her memory though she says symptoms are not bothersome to her personally. Husband expresses concern for her safety and ability to care for herself cognitively.   Review of Systems  Constitutional:  Negative for chills, fever, malaise/fatigue and weight loss.  HENT:  Negative for hearing loss, nosebleeds, sore throat and tinnitus.   Eyes:  Negative for blurred vision and double vision.   Respiratory:  Negative for  cough, hemoptysis, shortness of breath and wheezing.   Cardiovascular:  Negative for chest pain, palpitations and leg swelling.  Gastrointestinal:  Negative for abdominal pain, blood in stool, constipation, diarrhea, melena, nausea and vomiting.  Genitourinary:  Negative for dysuria and urgency.  Musculoskeletal:  Negative for back pain, falls, joint pain and myalgias.  Skin:  Negative for itching and rash.  Neurological:  Negative for dizziness, tingling, sensory change, loss of consciousness, weakness and headaches.  Endo/Heme/Allergies:  Negative for environmental allergies. Does not bruise/bleed easily.  Psychiatric/Behavioral:  Positive for memory loss. Negative for depression. The patient is not nervous/anxious and does not have insomnia.      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   Leg cramps    Lymphoma ([place]) 09/14/15   Monoclonal B cell lymphoma   Night sweats    Osteoporosis    Overweight(278.02)    Obesity   Palpitations    Shortness of breath dyspnea    Tachycardia    Thrombocytopenia (HCC)     PAST SURGICAL HISTORY :   Past Surgical History:  Procedure Laterality Date   ABDOMINAL HYSTERECTOMY  2005   BONE MARROW BIOPSY  09/14/15   CHOLECYSTECTOMY  1997   COLONOSCOPY  07/05/2014   ESOPHAGOGASTRODUODENOSCOPY  07/05/2014   INGUINAL LYMPH NODE BIOPSY Right 10/05/2015   Procedure: INGUINAL LYMPH NODE BIOPSY;  Surgeon: Christene Lye, MD;  Location: [facility] ORS;  Service: General;  Laterality: Right;   PERIPHERAL VASCULAR CATHETERIZATION N/A 09/27/2015   Procedure: Glori Luis Cath Insertion;  Surgeon: Algernon Huxley, MD;  Location: [facility];  Service:  Cardiovascular;  Laterality: N/A;   PORTA CATH REMOVAL Right 06/03/2017   Procedure: PORTA CATH REMOVAL, with venous thrombectomy;  Surgeon: Algernon Huxley, MD;  Location: [place] [facility];  Service: Cardiovascular;  Laterality: Right;   PORTACATH PLACEMENT Right     FAMILY HISTORY :   Family History  Problem Relation Age of Onset   Heart disease Father        CABG x 4   Multiple myeloma Father    Hypertension Mother    Pancreatic cancer Mother    Ulcers Mother    Asthma Mother    Brain cancer Maternal Uncle    Multiple myeloma Maternal Uncle    Diabetes Neg Hx    Breast cancer Neg Hx     SOCIAL HISTORY:   Social History   Tobacco Use   Smoking status: Never   Smokeless tobacco: Never  Vaping Use   Vaping Use: Never used  Substance Use Topics   Alcohol use: No    Alcohol/week: 0.0 standard drinks of alcohol   Drug use: No    ALLERGIES:  is allergic to ondansetron hcl, oxycodone, and rituximab.  MEDICATIONS:  Current Outpatient Medications  Medication Sig Dispense Refill   acetaminophen (TYLENOL) 650 MG CR tablet Take 650 mg by mouth every 8 (eight) hours as needed for pain.     midodrine (PROAMATINE) 5 MG tablet TAKE ONE AND ONE-HALF TABLETS THREE TIMES A DAY 180 tablet 1   ondansetron (ZOFRAN-ODT) 4 MG disintegrating tablet Take 1 tablet (4 mg total) by mouth every 8 (  eight) hours as needed for nausea or vomiting. (Patient not taking: Reported on 02/25/2022) 60 tablet 2   No current facility-administered medications for this visit.   Facility-Administered Medications Ordered in Other Visits  Medication Dose Route Frequency Provider Last Rate Last Admin   0.9 %  sodium chloride infusion   Intravenous Continuous Herring, Orville Govern, NP   Stopped at 10/14/15 1312   methotrexate (50 mg/ml) 6.3 g in sodium chloride 0.9 % 1,000 mL injection   Intravenous Once Charlaine Dalton R, MD       sodium chloride flush (NS) 0.9 % injection 10 mL  10 mL Intravenous PRN  Cammie Sickle, MD   10 mL at 10/05/16 1400    PHYSICAL EXAMINATION: ECOG PERFORMANCE STATUS: 0 - Asymptomatic  BP 106/74 (BP Location: Right Arm, Patient Position: Sitting)   Pulse 79   Temp (!) 97.1 F (36.2 C) (Tympanic)   Wt 190 lb (86.2 kg)   BMI 34.75 kg/m   Filed Weights   03/30/22 1105  Weight: 190 lb (86.2 kg)   Physical Exam Constitutional:      Appearance: She is not ill-appearing.  Cardiovascular:     Rate and Rhythm: Normal rate and regular rhythm.  Abdominal:     General: There is no distension.     Palpations: Abdomen is soft.     Tenderness: There is no abdominal tenderness. There is no guarding.  Musculoskeletal:        General: No deformity.     Right lower leg: No edema.     Left lower leg: No edema.  Lymphadenopathy:     Cervical: No cervical adenopathy.  Skin:    General: Skin is warm and dry.  Neurological:     Mental Status: She is alert and oriented to person, place, and time. Mental status is at baseline.  Psychiatric:        Mood and Affect: Mood normal.        Behavior: Behavior normal.     Comments: Impaired recent and remote memory     LABORATORY DATA:  I have reviewed the data as listed    Component Value Date/Time   NA 141 02/28/2022 0827   NA 138 08/12/2019 0000   K 4.0 02/28/2022 0827   CL 111 02/28/2022 0827   CO2 24 02/28/2022 0827   GLUCOSE 104 (H) 02/28/2022 0827   BUN 19 02/28/2022 0827   BUN 8 08/12/2019 0000   CREATININE 1.19 (H) 02/28/2022 0827   CALCIUM 9.2 02/28/2022 0827   PROT 6.5 02/26/2022 1124   ALBUMIN 3.9 02/26/2022 1124   AST 23 02/26/2022 1124   ALT 15 02/26/2022 1124   ALKPHOS 67 02/26/2022 1124   BILITOT 0.9 02/26/2022 1124   GFRNONAA 50 (L) 02/28/2022 0827   GFRAA >60 11/24/2019 1442    No results found for: "SPEP", "UPEP"  Lab Results  Component Value Date   WBC 7.4 03/30/2022   NEUTROABS 5.6 03/30/2022   HGB 13.7 03/30/2022   HCT 41.7 03/30/2022   MCV 91.0 03/30/2022   PLT 175  03/30/2022    RADIOGRAPHIC STUDIES: I have personally reviewed the radiological images as listed and agreed with the findings in the report.  MR Brain W Wo contrast 02/25/22 CLINICAL DATA:  Mental status change   EXAM: MRI HEAD WITHOUT AND WITH CONTRAST   TECHNIQUE: Multiplanar, multiecho pulse sequences of the brain and surrounding structures were obtained without and with intravenous contrast.   CONTRAST:  13m GADAVIST GADOBUTROL  1 MMOL/ML IV SOLN   COMPARISON:  None Available.   FINDINGS: Brain: No acute infarct, mass effect or extra-axial collection. Remote hemorrhage in the left frontal lobe. There is confluent hyperintense T2-weighted signal within the white matter. Generalized volume loss. The midline structures are normal.   Vascular: Major flow voids are preserved.   Skull and upper cervical spine: Normal calvarium and skull base. Visualized upper cervical spine and soft tissues are normal.   Sinuses/Orbits:No paranasal sinus fluid levels or advanced mucosal thickening. No mastoid or middle ear effusion. Normal orbits.   IMPRESSION: 1. No acute intracranial abnormality. 2. Remote hemorrhage in the left frontal lobe and findings of advanced chronic small vessel ischemia.     ASSESSMENT & PLAN:   Viral Infection- URI. Viral panel was positive for rhinovirus/enterovirus- s/p hospitalization and supportive care. Now recovered.   Recurrent DLBCL of brain- s/p R-MPVs with thrathecal cytarabine and WBRT. MRI 01/25/22- negative for residual or recurrent disease. Atrophy & white matter disease (see below) consistent with whole brain radiation. No acute abnormalities or changes. Plan to repeat Brain MRI with and without contrast in March 2024. MRI Brain in hospital on 02/25/22 was negative for recurrent disease. Chronic changes as previously described. Follow up with Dr Rogue Bussing as scheduled.   Cognitive and Motor Deficits- residual effects from WBRT and chemotherapy and  unlikely to improve. Recommend ongoing supportive care with PT/OT, improving comorbidities. Agrees to evaluation with Dr. Mickeal Skinner. Appreciate his input.   Mediport- not functional. Patient requests removal but does nto wish to return to [facility] where it was placed. Will refer to vascular.   Gait instability & Orthostasis- Unclear if this contributed to her fall. continue midodrine. PT and OT was recommended during hospitalization and upon discharge but patient declines. She is able to perform adls w/o assistance. Hold for now.   Bilateral UE/LE- cramping- ? Etiology. Since 2021 since completing her tx. Mag was normal. K borderline. Does not complain of this today.   Disposition: Referral to Dr Vaslow/neuro-onc Follow up with Dr Rogue Bussing as scheduled- la  No orders of the defined types were placed in this encounter.  All questions were answered. The patient knows to call the clinic with any problems, questions or concerns.     Verlon Au, NP 03/30/2022

## 2022-03-30 NOTE — Telephone Encounter (Signed)
IR order placed for port removal. Have faxed requested over to IR scheduling to get this removal scheduled.

## 2022-04-04 ENCOUNTER — Telehealth: Payer: Self-pay | Admitting: *Deleted

## 2022-04-04 NOTE — Telephone Encounter (Signed)
The husband says that there is schedule for her to get port out 11/27. The husband  has an appt on mondya with cardiac and says that if he gets fluid build up then he may need to take off fluid so he feels like next week is not good to make the appt to get port out. IR was on the phone with me and Marcie Bal told me to tell the man to call at [phone] and she will work with him to get pt port out 2 weeks from now. Husband is ok with this

## 2022-04-12 ENCOUNTER — Ambulatory Visit: Payer: Medicare Other | Admitting: Radiology

## 2022-04-23 ENCOUNTER — Encounter: Payer: Medicare Other | Admitting: Internal Medicine

## 2022-04-23 ENCOUNTER — Encounter: Payer: Self-pay | Admitting: Internal Medicine

## 2022-04-23 DIAGNOSIS — R531 Weakness: Secondary | ICD-10-CM

## 2022-04-26 NOTE — Telephone Encounter (Signed)
Spoke with pt's husband and he stated that he had to cancel pt's last appt with you because she was unable to walk again. He stated that this morning she woke up and is able to walk but is very weak. He stated that she did not eat yesterday and hasn't so far today and has had very little to drink. He states that it seems like the same kind of episode the last time that put her in the hospital for 3 days only not quite as bad as that time. Pt has an appt tomorrow with the neurologist at the cancer center if he is able to get her there. He just had open heart surgery so he is unable to lift her right now and some friends of his suggested that he get a rx for a hoyer lift to assist him with getting pt around. I have also let him know that I placed some urine specimen cups up front and scheduled pt for an appt on 05/16/2021.

## 2022-04-27 ENCOUNTER — Encounter: Payer: Self-pay | Admitting: Internal Medicine

## 2022-04-27 ENCOUNTER — Other Ambulatory Visit: Payer: Self-pay | Admitting: *Deleted

## 2022-04-27 ENCOUNTER — Inpatient Hospital Stay: Payer: Medicare Other | Attending: Internal Medicine | Admitting: Internal Medicine

## 2022-04-27 VITALS — BP 130/58 | HR 84 | Temp 98.0°F | Resp 16 | Ht 62.5 in | Wt 188.5 lb

## 2022-04-27 DIAGNOSIS — Z9221 Personal history of antineoplastic chemotherapy: Secondary | ICD-10-CM | POA: Diagnosis not present

## 2022-04-27 DIAGNOSIS — C8288 Other types of follicular lymphoma, lymph nodes of multiple sites: Secondary | ICD-10-CM | POA: Insufficient documentation

## 2022-04-27 DIAGNOSIS — R4189 Other symptoms and signs involving cognitive functions and awareness: Secondary | ICD-10-CM | POA: Insufficient documentation

## 2022-04-27 DIAGNOSIS — Z86718 Personal history of other venous thrombosis and embolism: Secondary | ICD-10-CM | POA: Diagnosis not present

## 2022-04-27 DIAGNOSIS — Z9484 Stem cells transplant status: Secondary | ICD-10-CM | POA: Diagnosis not present

## 2022-04-27 DIAGNOSIS — Z923 Personal history of irradiation: Secondary | ICD-10-CM | POA: Insufficient documentation

## 2022-04-27 MED ORDER — BACLOFEN 5 MG PO TABS: 5.0000 mg | ORAL_TABLET | Freq: Three times a day (TID) | ORAL | 1 refills | Status: DC | PRN

## 2022-04-27 NOTE — Progress Notes (Signed)
Patient has had recent episodes of unsteady gait, memory loss and cognition deficits

## 2022-04-27 NOTE — Telephone Encounter (Signed)
DME order signed  , please see mychart replay

## 2022-04-27 NOTE — Progress Notes (Signed)
[facility] [place], [place] [address] [phone]   Cognitive Survivorship Evaluation  Date of Service: 04/27/22 Patient Name: Stacy Murillo Patient MRN: [id number] Patient DOB: [birth year] Provider: Ventura Sellers, MD  Identifying Statement:  Stacy Murillo is a 66 y.o. female who presents for initial consultation and evaluation regarding cancer associated cognitive decline.    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 CT- 1-2 CM LN subpectoral/medistinal/ retro-peritoneal/pelvic/ Right inguinal LN 1.6cm- Bx- DLBCL- ABC; myc-POS; FISH gene re-arragement-NEG. PET- MULTIPLE LN/ Bone involvement; R-CHOP x6- CR'# OCT 2017- PET scan- CR; DEC 22nd 2017- BMBx- "atypical large B cells- <1%  [[facility]; II opinion- Dr.Grover- review of path- not concerning for residual lymphoma]; recommend surviellance  # March 26th  2018PET- NED  # Lumbar puncture- difficult-spinal headache. S/p high dose MXT x4.  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   # OCT 2020- MASS  spanning the corpus callosum. Brain Biopsy  at [facility]-DLBCL. CD20+. EBV -, Ki67+, Pax5+. Port placed 10/27, R-MPV started 10/27 [facility]; Dr.Grover]. s/p WBRT ; MARCH 31st- MRI-  remission/Stable disease  ---------------------------------------------------------- # June 2017-Elevated LFTs- valacylovir/diflucan; resolved.   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   # May 2017- EF- 55-65%  #August 2019-double vision; secondary to refractive error-prism improved. 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  Stacy Murillo presents today to discuss cognitive changes since brain radiation for lymphoma in 2020.  She describes significant impairment in attention, processing speed and short term memory.  She requires some help at home with activities of daily living.  In addition, she experiences clear decline in cognition and overall function during illness or times of stress.  Otherwise denies focal complaints, no seizures, headaches.  Medications: Current Outpatient Medications on File Prior to Visit  Medication Sig Dispense Refill   acetaminophen (TYLENOL) 650 MG CR tablet Take 650 mg by mouth every 8 (eight) hours as needed for pain.     midodrine (PROAMATINE) 5 MG tablet TAKE ONE AND ONE-HALF TABLETS THREE TIMES A DAY 180 tablet 1   ondansetron (ZOFRAN-ODT) 4 MG disintegrating tablet Take 1 tablet (4 mg total) by mouth every 8 (eight) hours as needed for nausea or vomiting. (Patient not taking: Reported on 02/25/2022) 60 tablet 2   Current Facility-Administered Medications on File Prior to Visit  Medication Dose Route Frequency  Provider Last Rate Last  Admin   0.9 %  sodium chloride infusion   Intravenous Continuous Herring, Orville Govern, NP   Stopped at 10/14/15 1312   methotrexate (50 mg/ml) 6.3 g in sodium chloride 0.9 % 1,000 mL injection   Intravenous Once Charlaine Dalton R, MD       sodium chloride flush (NS) 0.9 % injection 10 mL  10 mL Intravenous PRN Cammie Sickle, MD   10 mL at 10/05/16 1400    Allergies:  Allergies  Allergen Reactions   Ondansetron Hcl Other (See Comments)    Severe constipation    Oxycodone     Biliary colic with opioids s/p cholecystectomy     Rituximab Other (See Comments)    Pt reports throat tightness and facial swelling and redness during infusion of Rituxan   Past Medical History:  Past Medical History:  Diagnosis Date   Arthritis    Cancer ([place])    Diverticulosis 07/06/15   Dysrhythmia    tachycardia   Esophagitis 07/06/15   Fatigue    Gastritis 07/06/15   GERD (gastroesophageal reflux disease)    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   Jugular vein occlusion, 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   Leg cramps    Lymphoma ([place]) 09/14/15   Monoclonal B cell lymphoma   Night sweats    Osteoporosis    Overweight(278.02)    Obesity   Palpitations    Shortness of breath dyspnea    Tachycardia    Thrombocytopenia (HCC)    Past Surgical History:  Past Surgical History:  Procedure Laterality Date   ABDOMINAL HYSTERECTOMY  2005   BONE MARROW BIOPSY  09/14/15   CHOLECYSTECTOMY  1997   COLONOSCOPY  07/05/2014   ESOPHAGOGASTRODUODENOSCOPY  07/05/2014   INGUINAL LYMPH NODE BIOPSY Right 10/05/2015   Procedure: INGUINAL LYMPH NODE BIOPSY;  Surgeon: Christene Lye, MD;  Location: [facility] OR[facility];  Service: General;  Laterality: Right;   PERIPHERAL VASCULAR CATHETERIZATION N/A 09/27/2015   Procedure: Glori Luis Cath Insertion;  Surgeon: Algernon Huxley, MD;  Location: [place];  Service: Cardiovascular;  Laterality: N/A;   PORTA CATH REMOVAL Right 06/03/2017   Procedure: PORTA CATH REMOVAL,  with venous thrombectomy;  Surgeon: Algernon Huxley, MD;  Location: [place] [facility];  Service: Cardiovascular;  Laterality: Right;   PORTACATH PLACEMENT Right    Social History:  Social History   Socioeconomic History   Marital status: Married    Spouse name: Not on file   Number of children: Not on file   Years of education: Not on file   Highest education level: Not on file  Occupational History   Not on file  Tobacco Use   Smoking status: Never   Smokeless tobacco: Never  Vaping Use   Vaping Use: Never used  Substance and Sexual Activity   Alcohol use: No    Alcohol/week: 0.0 standard drinks of alcohol   Drug use: No   Sexual activity: Yes    Birth control/protection: None  Other Topics Concern   Not on file  Social History Narrative   Married   Does not get regular exercise   Social Determinants of Health   Financial Resource Strain: Low Risk  (06/29/2021)   Overall Financial Resource Strain (CARDIA)    Difficulty of Paying Living Expenses: Not hard at all  Food Insecurity: No Food Insecurity (02/26/2022)   Hunger Vital Sign    Worried About Running Out of Food in the Last Year: Never true    Ran Out of Food  in the Last Year: Never true  Transportation Needs: No Transportation Needs (02/26/2022)   PRAPARE - Hydrologist (Medical): No    Lack of Transportation (Non-Medical): No  Physical Activity: Sufficiently Active (06/29/2021)   Exercise Vital Sign    Days of Exercise per Week: 5 days    Minutes of Exercise per Session: 30 min  Stress: No Stress Concern Present (06/29/2021)   [place]    Feeling of Stress : Not at all  Social Connections: Unknown (06/29/2021)   Social Connection and Isolation Panel [NHANES]    Frequency of Communication with Friends and Family: Not on file    Frequency of Social Gatherings with Friends and Family: Not on file    Attends Religious  Services: Not on file    Active Member of Clubs or Organizations: Not on file    Attends Archivist Meetings: Not on file    Marital Status: Married  Intimate Partner Violence: Not At Risk (02/26/2022)   Humiliation, Afraid, Rape, and Kick questionnaire    Fear of Current or Ex-Partner: No    Emotionally Abused: No    Physically Abused: No    Sexually Abused: No   Family History:  Family History  Problem Relation Age of Onset   Heart disease Father        CABG x 4   Multiple myeloma Father    Hypertension Mother    Pancreatic cancer Mother    Ulcers Mother    Asthma Mother    Brain cancer Maternal Uncle    Multiple myeloma Maternal Uncle    Diabetes Neg Hx    Breast cancer Neg Hx     Review of Systems: Constitutional: Doesn't report fevers, chills or abnormal weight loss Eyes: Doesn't report blurriness of vision Ears, nose, mouth, throat, and face: Doesn't report sore throat Respiratory: Doesn't report cough, dyspnea or wheezes Cardiovascular: Doesn't report palpitation, chest discomfort  Gastrointestinal:  Doesn't report nausea, constipation, diarrhea GU: Doesn't report incontinence Skin: Doesn't report skin rashes Neurological: Per HPI Musculoskeletal: Doesn't report joint pain Behavioral/Psych: denies anxiety  Physical Exam: Vitals:   04/27/22 1157  BP: (!) 130/58  Pulse: 84  Resp: 16  Temp: 98 F (36.7 C)  SpO2: 98%   General: Alert, cooperative, pleasant, in no acute distress Head: Normal EENT: No conjunctival injection or scleral icterus.  Lungs: Resp effort normal Cardiac: Regular rate Abdomen: Non-distended abdomen Skin: No rashes cyanosis or petechiae. Extremities: No clubbing or edema  Neurologic Exam: Mental Status: Awake, alert, attentive to examiner. Oriented to self and environment. Language is fluent with intact comprehension.  Age advanced psychomotor slowing, impaired recall  Cranial Nerves: Visual acuity is grossly normal.  Visual fields are full. Extra-ocular movements intact. No ptosis. Face is symmetric Motor: Tone and bulk are normal. Power is full in both arms and legs.  Sensory: Intact to light touch Gait: Normal.   Labs: I have reviewed the data as listed    Component Value Date/Time   NA 138 03/30/2022 1044   NA 138 08/12/2019 0000   K 3.9 03/30/2022 1044   CL 102 03/30/2022 1044   CO2 25 03/30/2022 1044   GLUCOSE 135 (H) 03/30/2022 1044   BUN 13 03/30/2022 1044   BUN 8 08/12/2019 0000   CREATININE 1.35 (H) 03/30/2022 1044   CALCIUM 9.4 03/30/2022 1044   PROT 6.6 03/30/2022 1044   ALBUMIN 4.1 03/30/2022 1044  AST 19 03/30/2022 1044   ALT 12 03/30/2022 1044   ALKPHOS 72 03/30/2022 1044   BILITOT 0.7 03/30/2022 1044   GFRNONAA 43 (L) 03/30/2022 1044   GFRAA >60 11/24/2019 1442   Lab Results  Component Value Date   WBC 7.4 03/30/2022   NEUTROABS 5.6 03/30/2022   HGB 13.7 03/30/2022   HCT 41.7 03/30/2022   MCV 91.0 03/30/2022   PLT 175 03/30/2022    Assessment/Plan:  Cognitive Changes  Fauna B [name] presents with clinical syndrome consistent with cognitive impairment secondary to downstream effects of whole brain radiation and methotrexate chemotherapy.    MRI is notable for severe leukomalacia and moderate atrophy.   We provided counseling regarding healthy behaviors to maintain cognitive function, including exercise, diet, and positive outlook.  We discussed mindful relaxation and provided some methods to redirect anxiety without medication.   No further CNS workup or cognitive screening recommended at this time.  For spasticity, can try baclofen 72m TID PRN.  We will have social work reach out given needs at home, caregiver concerns.  We spent twenty additional minutes teaching regarding the natural history, biology, and historical experience in the treatment of neurologic complications of cancer.   We appreciate the opportunity to participate in the care of Apoorva B  CSan Murillo  We encouraged follow up for progressive cognitive issues or neurologic symptoms if they develop.  All questions were answered. The patient knows to call the clinic with any problems, questions or concerns. No barriers to learning were detected.  The total time spent in the encounter was 40 minutes and more than 50% was on counseling and review of test results   ZVentura Sellers MD Medical Director of Neuro-Oncology [facility] [place]12/15/23 11:51 AM

## 2022-04-30 ENCOUNTER — Encounter: Payer: Self-pay | Admitting: Licensed Clinical Social Worker

## 2022-04-30 DIAGNOSIS — C8583 Other specified types of non-Hodgkin lymphoma, intra-abdominal lymph nodes: Secondary | ICD-10-CM

## 2022-04-30 NOTE — Progress Notes (Signed)
[place] [facility]  Clinical Social Work was referred by medical provider for assessment of psychosocial needs.  Clinical Social Worker  contacted caregiver by phone   to offer support and assess for needs.  CSW left voicemail main and secondary phone numbers, with contact information and request for a return call.   FA  Adelene Amas, LCSW  Clinical Social Worker [facility]

## 2022-05-16 ENCOUNTER — Encounter: Payer: Self-pay | Admitting: Internal Medicine

## 2022-05-16 ENCOUNTER — Ambulatory Visit (INDEPENDENT_AMBULATORY_CARE_PROVIDER_SITE_OTHER): Payer: Medicare Other | Admitting: Internal Medicine

## 2022-05-16 VITALS — BP 104/58 | HR 70 | Temp 97.4°F | Ht 62.5 in | Wt 195.6 lb

## 2022-05-16 DIAGNOSIS — R4189 Other symptoms and signs involving cognitive functions and awareness: Secondary | ICD-10-CM

## 2022-05-16 DIAGNOSIS — Z95828 Presence of other vascular implants and grafts: Secondary | ICD-10-CM

## 2022-05-16 DIAGNOSIS — E663 Overweight: Secondary | ICD-10-CM

## 2022-05-16 DIAGNOSIS — R531 Weakness: Secondary | ICD-10-CM | POA: Diagnosis not present

## 2022-05-16 DIAGNOSIS — Z23 Encounter for immunization: Secondary | ICD-10-CM

## 2022-05-16 DIAGNOSIS — R030 Elevated blood-pressure reading, without diagnosis of hypertension: Secondary | ICD-10-CM

## 2022-05-16 DIAGNOSIS — R5383 Other fatigue: Secondary | ICD-10-CM

## 2022-05-16 DIAGNOSIS — E782 Mixed hyperlipidemia: Secondary | ICD-10-CM | POA: Diagnosis not present

## 2022-05-16 DIAGNOSIS — Y92009 Unspecified place in unspecified non-institutional (private) residence as the place of occurrence of the external cause: Secondary | ICD-10-CM

## 2022-05-16 DIAGNOSIS — W19XXXA Unspecified fall, initial encounter: Secondary | ICD-10-CM

## 2022-05-16 NOTE — Patient Instructions (Addendum)
I recommend getting the influenza vaccine  I recommend a recliner with a lift   I'll talk to Drs Lucky Cowboy and Rogue Bussing about the PORT

## 2022-05-16 NOTE — Progress Notes (Unsigned)
Subjective:  Patient ID: Stacy Murillo, female    DOB: 1955-08-15  Age: 67 y.o. MRN: [id number]  CC: The primary encounter diagnosis was Mixed hyperlipidemia. Diagnoses of Overweight, Other fatigue, Generalized weakness, Need for immunization against influenza, Cognitive impairment, Elevated BP without diagnosis of hypertension, Fall at home, initial encounter, and Port-A-Cath in place were also pertinent to this visit.   HPI Stacy Murillo presents for follow up on chronic issues  Chief Complaint  Patient presents with   Medical Management of Chronic Issues   1) Reviewed hospitalization in October for delirium aggravated by viral illness ,  was referred to neurologist Dr .  Mickeal Skinner and seen on  Dec 15  for evaluation of cancer  related cognitive decline with  significant impairment in attention, processing speed and short term memory.  She requires some help at home with activities of daily living.  In addition, she experiences clear decline in cognition and overall function during illness or times of stress.  She was advised that she was experiencing the effects whole brain irradiation and methotrexate ,  and no further workup or screening was recommended given the advanced aging of the brain caused by the medications.   She did not tolerate COVID vaccines .  In mid December had similar symptoms as with the vaccine  Has been contacted by social work (rec by [employer])  Needs PORT left chest  removed  but hesitant to subject her to anesthesia or twilight medication . Would prefer to avoid removing it unless it increases her risk for clot.  Has a personal trainer out 2 times per week.  Walking without assistance   Has MRI and labs in March  Needs Hoyer lift     Outpatient Medications Prior to Visit  Medication Sig Dispense Refill   acetaminophen (TYLENOL) 650 MG CR tablet Take 650 mg by mouth every 8 (eight) hours as needed for pain.     midodrine (PROAMATINE) 5 MG tablet TAKE ONE AND  ONE-HALF TABLETS THREE TIMES A DAY 180 tablet 1   ondansetron (ZOFRAN-ODT) 4 MG disintegrating tablet Take 1 tablet (4 mg total) by mouth every 8 (eight) hours as needed for nausea or vomiting. 60 tablet 2   Baclofen 5 MG TABS Take 5 mg by mouth 3 (three) times daily as needed. (Patient not taking: Reported on 05/16/2022) 90 tablet 1   Facility-Administered Medications Prior to Visit  Medication Dose Route Frequency Provider Last Rate Last Admin   0.9 %  sodium chloride infusion   Intravenous Continuous Herring, Orville Govern, NP   Stopped at 10/14/15 1312   methotrexate (50 mg/ml) 6.3 g in sodium chloride 0.9 % 1,000 mL injection   Intravenous Once Charlaine Dalton R, MD       sodium chloride flush (NS) 0.9 % injection 10 mL  10 mL Intravenous PRN Cammie Sickle, MD   10 mL at 10/05/16 1400    Review of Systems;  Patient denies headache, fevers, malaise, unintentional weight loss, skin rash, eye pain, sinus congestion and sinus pain, sore throat, dysphagia,  hemoptysis , cough, dyspnea, wheezing, chest pain, palpitations, orthopnea, edema, abdominal pain, nausea, melena, diarrhea, constipation, flank pain, dysuria, hematuria, urinary  Frequency, nocturia, numbness, tingling, seizures,  Focal weakness, Loss of consciousness,  Tremor, insomnia, depression, anxiety, and suicidal ideation.      Objective:  BP (!) 104/58   Pulse 70   Temp (!) 97.4 F (36.3 C) (Oral)   Ht 5' 2.5" (1.588 m)  Wt 195 lb 9.6 oz (88.7 kg)   SpO2 99%   BMI 35.21 kg/m   BP Readings from Last 3 Encounters:  05/16/22 (!) 104/58  04/27/22 (!) 130/58  03/30/22 106/74    Wt Readings from Last 3 Encounters:  05/16/22 195 lb 9.6 oz (88.7 kg)  04/27/22 188 lb 8 oz (85.5 kg)  03/30/22 190 lb (86.2 kg)    Physical Exam  Lab Results  Component Value Date   HGBA1C 5.4 04/20/2021   HGBA1C 5.4 12/22/2018   HGBA1C 5.1 12/17/2017    Lab Results  Component Value Date   CREATININE 1.35 (H) 03/30/2022    CREATININE 1.19 (H) 02/28/2022   CREATININE 1.15 (H) 02/26/2022    Lab Results  Component Value Date   WBC 7.4 03/30/2022   HGB 13.7 03/30/2022   HCT 41.7 03/30/2022   PLT 175 03/30/2022   GLUCOSE 135 (H) 03/30/2022   CHOL 239 (H) 04/20/2021   TRIG 292.0 (H) 04/20/2021   HDL 67.80 04/20/2021   LDLDIRECT 139.0 04/20/2021   LDLCALC 122 (H) 10/04/2016   ALT 12 03/30/2022   AST 19 03/30/2022   NA 138 03/30/2022   K 3.9 03/30/2022   CL 102 03/30/2022   CREATININE 1.35 (H) 03/30/2022   BUN 13 03/30/2022   CO2 25 03/30/2022   TSH 4.49 04/20/2021   INR 0.89 06/02/2017   HGBA1C 5.4 04/20/2021   MICROALBUR 0.4 08/11/2013    MR Brain W and Wo Contrast  Result Date: 02/25/2022 CLINICAL DATA:  Mental status change EXAM: MRI HEAD WITHOUT AND WITH CONTRAST TECHNIQUE: Multiplanar, multiecho pulse sequences of the brain and surrounding structures were obtained without and with intravenous contrast. CONTRAST:  72m GADAVIST GADOBUTROL 1 MMOL/ML IV SOLN COMPARISON:  None Available. FINDINGS: Brain: No acute infarct, mass effect or extra-axial collection. Remote hemorrhage in the left frontal lobe. There is confluent hyperintense T2-weighted signal within the white matter. Generalized volume loss. The midline structures are normal. Vascular: Major flow voids are preserved. Skull and upper cervical spine: Normal calvarium and skull base. Visualized upper cervical spine and soft tissues are normal. Sinuses/Orbits:No paranasal sinus fluid levels or advanced mucosal thickening. No mastoid or middle ear effusion. Normal orbits. IMPRESSION: 1. No acute intracranial abnormality. 2. Remote hemorrhage in the left frontal lobe and findings of advanced chronic small vessel ischemia. Electronically Signed   By: KUlyses JarredM.D.   On: 02/25/2022 21:34   DG Chest 2 View  Result Date: 02/25/2022 CLINICAL DATA:  Weakness EXAM: CHEST - 2 VIEW COMPARISON:  Chest x-ray 06/02/2017 FINDINGS: Left chest port catheter  tip projects over the brachiocephalic SVC junction. The heart size and mediastinal contours are within normal limits. Both lungs are clear. The visualized skeletal structures are unremarkable. IMPRESSION: No active cardiopulmonary disease. Electronically Signed   By: ARonney AstersM.D.   On: 02/25/2022 17:10   DG Sacrum/Coccyx  Result Date: 02/25/2022 CLINICAL DATA:  Tailbone pain after a fall. EXAM: SACRUM AND COCCYX - 2+ VIEW COMPARISON:  None Available. FINDINGS: There is no evidence of fracture or other focal bone lesions. IMPRESSION: Negative. Electronically Signed   By: EMisty StanleyM.D.   On: 02/25/2022 17:09   CT Head Wo Contrast  Result Date: 02/25/2022 CLINICAL DATA:  Mental status changes.  Status post fall. EXAM: CT HEAD WITHOUT CONTRAST TECHNIQUE: Contiguous axial images were obtained from the base of the skull through the vertex without intravenous contrast. RADIATION DOSE REDUCTION: This exam was performed according to the departmental  dose-optimization program which includes automated exposure control, adjustment of the mA and/or kV according to patient size and/or use of iterative reconstruction technique. COMPARISON:  09/30/2015.  MRI brain 01/25/2022. FINDINGS: Brain: There is no evidence for acute hemorrhage, hydrocephalus, mass lesion, or abnormal extra-axial fluid collection. No definite CT evidence for acute infarction. Diffuse loss of parenchymal volume is consistent with atrophy. Symmetric marked low-attenuation the deep hemispheric white matter bilaterally is similar to recent MRI in this patient status post whole brain radiation for CNS lymphoma. Vascular: No hyperdense vessel or unexpected calcification. Skull: No evidence for fracture. No worrisome lytic or sclerotic lesion. Sinuses/Orbits: The visualized paranasal sinuses and mastoid air cells are clear. Visualized portions of the globes and intraorbital fat are unremarkable. Other: None. IMPRESSION: 1. No acute intracranial  abnormality. 2. Symmetric marked low-attenuation the deep hemispheric white matter bilaterally is compatible with white matter changes seen on recent MRI in this patient status post whole brain radiation for CNS lymphoma. 3. Atrophy with chronic small vessel ischemic disease. Electronically Signed   By: Misty Stanley M.D.   On: 02/25/2022 16:38    Assessment & Plan:  .Mixed hyperlipidemia  Overweight  Other fatigue  Generalized weakness -     For home use only DME Other see comment  Need for immunization against influenza -     Flu Vaccine QUAD 3moIM (Fluarix, Fluzone & Alfiuria Quad PF)  Cognitive impairment Assessment & Plan: Secondary to the effects of whole brain irradiation , aggravated by recent viral illness.  No further workup or screening recommended    Elevated BP without diagnosis of hypertension Assessment & Plan: BP has normalized    Fall at home, initial encounter Assessment & Plan: Secondary to deconditioning and effects of brain irradiation on balance.  Receiving PT.  Recommending lift chair and Hoyer lift    Port-A-Cath in place Assessment & Plan: She has postponed removal due to fear of side effects of anesthesia.  I have discussed wit Dr DLucky Cowboy  the port, if to remain in place,  should be flushed monthly, to reduce risk of clots, or it can be removed without conscious sedation , using topical or local anesthesia       I provided 40 minutes of face-to-face time during this encounter reviewing patient's last visit with me, patient's  most recent visit with  oncology vascular surgery ,  and neurology,  recent surgical and non surgical procedures, previous  labs and imaging studies, counseling on currently addressed issues,  and post visit ordering to diagnostics and therapeutics .   Follow-up: No follow-ups on file.   TCrecencio Mc MD

## 2022-05-17 ENCOUNTER — Telehealth: Payer: Self-pay | Admitting: Internal Medicine

## 2022-05-17 DIAGNOSIS — Z95828 Presence of other vascular implants and grafts: Secondary | ICD-10-CM | POA: Insufficient documentation

## 2022-05-17 NOTE — Assessment & Plan Note (Signed)
BP has normalized

## 2022-05-17 NOTE — Assessment & Plan Note (Signed)
She has postponed removal due to fear of side effects of anesthesia.  I have discussed wit Dr Lucky Cowboy;  the port, if to remain in place,  should be flushed monthly, to reduce risk of clots, or it can be removed without conscious sedation , using topical or local anesthesia

## 2022-05-17 NOTE — Assessment & Plan Note (Signed)
Secondary to deconditioning and effects of brain irradiation on balance.  Receiving PT.  Recommending lift chair and [employer] fast streamer

## 2022-05-17 NOTE — Assessment & Plan Note (Signed)
Secondary to the effects of whole brain irradiation , aggravated by recent viral illness.  No further workup or screening recommended

## 2022-05-23 ENCOUNTER — Other Ambulatory Visit: Payer: Self-pay | Admitting: Internal Medicine

## 2022-07-06 ENCOUNTER — Ambulatory Visit (INDEPENDENT_AMBULATORY_CARE_PROVIDER_SITE_OTHER): Payer: Medicare Other

## 2022-07-06 VITALS — Ht 62.5 in | Wt 195.0 lb

## 2022-07-06 DIAGNOSIS — Z0000 Encounter for general adult medical examination without abnormal findings: Secondary | ICD-10-CM

## 2022-07-06 NOTE — Patient Instructions (Addendum)
Ms. Stacy Murillo , Thank you for taking time to come for your Medicare Wellness Visit. I appreciate your ongoing commitment to your health goals. Please review the following plan we discussed and let me know if I can assist you in the future.   These are the goals we discussed:  Goals      Follow up with Primary Care Provider     As needed.        This is a list of the screening recommended for you and due dates:  Health Maintenance  Topic Date Due   COVID-19 Vaccine (4 - 2023-24 season) 07/22/2022*   Medicare Annual Wellness Visit  07/07/2023   Colon Cancer Screening  06/19/2024   DTaP/Tdap/Td vaccine (4 - Td or Tdap) 02/01/2029   Pneumonia Vaccine  Completed   Flu Shot  Completed   DEXA scan (bone density measurement)  Completed   Hepatitis C Screening: USPSTF Recommendation to screen - Ages 68-70 yo.  Completed   Zoster (Shingles) Vaccine  Completed   HPV Vaccine  Aged Out  *Topic was postponed. The date shown is not the original due date.    Advanced directives: End of life planning; Advance aging; Advanced directives discussed.  Copy of current HCPOA/Living Will requested.    Conditions/risks identified: none new.  Next appointment: Follow up in one year for your annual wellness visit    Preventive Care 65 Years and Older, Female Preventive care refers to lifestyle choices and visits with your health care provider that can promote health and wellness. What does preventive care include? A yearly physical exam. This is also called an annual well check. Dental exams once or twice a year. Routine eye exams. Ask your health care provider how often you should have your eyes checked. Personal lifestyle choices, including: Daily care of your teeth and gums. Regular physical activity. Eating a healthy diet. Avoiding tobacco and drug use. Limiting alcohol use. Practicing safe sex. Taking low-dose aspirin every day. Taking vitamin and mineral supplements as recommended by your  health care provider. What happens during an annual well check? The services and screenings done by your health care provider during your annual well check will depend on your age, overall health, lifestyle risk factors, and family history of disease. Counseling  Your health care provider may ask you questions about your: Alcohol use. Tobacco use. Drug use. Emotional well-being. Home and relationship well-being. Sexual activity. Eating habits. History of falls. Memory and ability to understand (cognition). Work and work Statistician. Reproductive health. Screening  You may have the following tests or measurements: Height, weight, and BMI. Blood pressure. Lipid and cholesterol levels. These may be checked every 5 years, or more frequently if you are over 40 years old. Skin check. Lung cancer screening. You may have this screening every year starting at age 68 if you have a 30-pack-year history of smoking and currently smoke or have quit within the past 15 years. Fecal occult blood test (FOBT) of the stool. You may have this test every year starting at age 68. Flexible sigmoidoscopy or colonoscopy. You may have a sigmoidoscopy every 5 years or a colonoscopy every 10 years starting at age 68. Hepatitis C blood test. Hepatitis B blood test. Sexually transmitted disease (STD) testing. Diabetes screening. This is done by checking your blood sugar (glucose) after you have not eaten for a while (fasting). You may have this done every 1-3 years. Bone density scan. This is done to screen for osteoporosis. You may have this done  starting at age 65. Mammogram. This may be done every 1-2 years. Talk to your health care provider about how often you should have regular mammograms. Talk with your health care provider about your test results, treatment options, and if necessary, the need for more tests. Vaccines  Your health care provider may recommend certain vaccines, such as: Influenza vaccine.  This is recommended every year. Tetanus, diphtheria, and acellular pertussis (Tdap, Td) vaccine. You may need a Td booster every 10 years. Zoster vaccine. You may need this after age 68. Pneumococcal 13-valent conjugate (PCV13) vaccine. One dose is recommended after age 68. Pneumococcal polysaccharide (PPSV23) vaccine. One dose is recommended after age 68. Talk to your health care provider about which screenings and vaccines you need and how often you need them. This information is not intended to replace advice given to you by your health care provider. Make sure you discuss any questions you have with your health care provider. Document Released: 05/27/2015 Document Revised: 01/18/2016 Document Reviewed: 03/01/2015 Elsevier Interactive Patient Education  2017 [employer] Prevention in the Home Falls can cause injuries. They can happen to people of all ages. There are many things you can do to make your home safe and to help prevent falls. What can I do on the outside of my home? Regularly fix the edges of walkways and driveways and fix any cracks. Remove anything that might make you trip as you walk through a door, such as a raised step or threshold. Trim any bushes or trees on the path to your home. Use bright outdoor lighting. Clear any walking paths of anything that might make someone trip, such as rocks or tools. Regularly check to see if handrails are loose or broken. Make sure that both sides of any steps have handrails. Any raised decks and porches should have guardrails on the edges. Have any leaves, snow, or ice cleared regularly. Use sand or salt on walking paths during winter. Clean up any spills in your garage right away. This includes oil or grease spills. What can I do in the bathroom? Use night lights. Install grab bars by the toilet and in the tub and shower. Do not use towel bars as grab bars. Use non-skid mats or decals in the tub or shower. If you need to sit  down in the shower, use a plastic, non-slip stool. Keep the floor dry. Clean up any water that spills on the floor as soon as it happens. Remove soap buildup in the tub or shower regularly. Attach bath mats securely with double-sided non-slip rug tape. Do not have throw rugs and other things on the floor that can make you trip. What can I do in the bedroom? Use night lights. Make sure that you have a light by your bed that is easy to reach. Do not use any sheets or blankets that are too big for your bed. They should not hang down onto the floor. Have a firm chair that has side arms. You can use this for support while you get dressed. Do not have throw rugs and other things on the floor that can make you trip. What can I do in the kitchen? Clean up any spills right away. Avoid walking on wet floors. Keep items that you use a lot in easy-to-reach places. If you need to reach something above you, use a strong step stool that has a grab bar. Keep electrical cords out of the way. Do not use floor polish or wax that  makes floors slippery. If you must use wax, use non-skid floor wax. Do not have throw rugs and other things on the floor that can make you trip. What can I do with my stairs? Do not leave any items on the stairs. Make sure that there are handrails on both sides of the stairs and use them. Fix handrails that are broken or loose. Make sure that handrails are as long as the stairways. Check any carpeting to make sure that it is firmly attached to the stairs. Fix any carpet that is loose or worn. Avoid having throw rugs at the top or bottom of the stairs. If you do have throw rugs, attach them to the floor with carpet tape. Make sure that you have a light switch at the top of the stairs and the bottom of the stairs. If you do not have them, ask someone to add them for you. What else can I do to help prevent falls? Wear shoes that: Do not have high heels. Have rubber bottoms. Are  comfortable and fit you well. Are closed at the toe. Do not wear sandals. If you use a stepladder: Make sure that it is fully opened. Do not climb a closed stepladder. Make sure that both sides of the stepladder are locked into place. Ask someone to hold it for you, if possible. Clearly mark and make sure that you can see: Any grab bars or handrails. First and last steps. Where the edge of each step is. Use tools that help you move around (mobility aids) if they are needed. These include: Canes. Walkers. Scooters. Crutches. Turn on the lights when you go into a dark area. Replace any light bulbs as soon as they burn out. Set up your furniture so you have a clear path. Avoid moving your furniture around. If any of your floors are uneven, fix them. If there are any pets around you, be aware of where they are. Review your medicines with your doctor. Some medicines can make you feel dizzy. This can increase your chance of falling. Ask your doctor what other things that you can do to help prevent falls. This information is not intended to replace advice given to you by your health care provider. Make sure you discuss any questions you have with your health care provider. Document Released: 02/24/2009 Document Revised: 10/06/2015 Document Reviewed: 06/04/2014 Elsevier Interactive Patient Education  2017 [employer].

## 2022-07-06 NOTE — Progress Notes (Addendum)
Subjective:   Doniqua B [place] is a 67 y.o. female who presents for Medicare Annual (Subsequent) preventive examination.  Review of Systems    No ROS.  Medicare Wellness Virtual Visit.  Visual/audio telehealth visit, UTA vital signs.   See social history for additional risk factors.   Cardiac Risk Factors include: advanced age (>75men, >54 women)     Objective:    There were no vitals filed for this visit. There is no height or weight on file to calculate BMI.     07/06/2022   11:11 AM 04/27/2022   11:47 AM 03/30/2022   10:55 AM 02/25/2022    3:54 PM 01/29/2022   10:10 AM 07/28/2021   10:28 AM 06/29/2021    9:15 AM  Advanced Directives  Does Patient Have a Medical Advance Directive? Yes Yes Yes No Yes Yes Yes  Type of Estate agent of [place];Living will Healthcare Power of [place];Living will   Healthcare Power of [place];Living will Healthcare Power of [place];Living will Healthcare Power of [place];Living will  Does patient want to make changes to medical advance directive? No - Patient declined No - Patient declined     No - Patient declined  Copy of Healthcare Power of Attorney in Chart? No - copy requested No - copy requested     No - copy requested  Would patient like information on creating a medical advance directive?  No - Patient declined  No - Patient declined       Current Medications (verified) Outpatient Encounter Medications as of 07/06/2022  Medication Sig   acetaminophen (TYLENOL) 650 MG CR tablet Take 650 mg by mouth every 8 (eight) hours as needed for pain.   Baclofen 5 MG TABS Take 5 mg by mouth 3 (three) times daily as needed. (Patient not taking: Reported on 05/16/2022)   midodrine (PROAMATINE) 5 MG tablet TAKE ONE AND ONE-HALF TABLETS THREE TIMES A DAY   ondansetron (ZOFRAN-ODT) 4 MG disintegrating tablet Take 1 tablet (4 mg total) by mouth every 8 (eight) hours as needed for nausea or vomiting.   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   Leg cramps    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   Night sweats    Osteoporosis    Overweight(278.02)    Obesity   Palpitations    Shortness of breath dyspnea    Tachycardia    Thrombocytopenia (HCC)    Past Surgical History:  Procedure Laterality Date   ABDOMINAL HYSTERECTOMY  2005   BONE MARROW BIOPSY  09/14/15   CHOLECYSTECTOMY  1997   COLONOSCOPY  07/05/2014   ESOPHAGOGASTRODUODENOSCOPY  07/05/2014   INGUINAL LYMPH NODE BIOPSY Right 10/05/2015   Procedure: INGUINAL LYMPH NODE BIOPSY;  Surgeon: Kieth Brightly, MD;  Location: [facility] ORS;  Service: General;  Laterality: Right;   PERIPHERAL VASCULAR CATHETERIZATION N/A 09/27/2015  Procedure: Porta Cath Insertion;  Surgeon: Annice Needy, MD;  Location: [facility] INVASIVE CV LAB;  Service: Cardiovascular;  Laterality: N/A;   PORTA CATH REMOVAL Right 06/03/2017   Procedure: PORTA CATH REMOVAL, with venous thrombectomy;  Surgeon: Annice Needy, MD;  Location: [facility] INVASIVE CV LAB;  Service: Cardiovascular;  Laterality: Right;   PORTACATH PLACEMENT Right    Family History  Problem Relation Age of Onset   Heart disease Father        CABG x 4   Multiple myeloma Father    Hypertension Mother    Pancreatic cancer Mother    Ulcers Mother    Asthma Mother    Brain cancer Maternal Uncle    Multiple myeloma Maternal Uncle    Diabetes Neg Hx     Breast cancer Neg Hx    Social History   Socioeconomic History   Marital status: Married    Spouse name: Not on file   Number of children: Not on file   Years of education: Not on file   Highest education level: Not on file  Occupational History   Not on file  Tobacco Use   Smoking status: Never   Smokeless tobacco: Never  Vaping Use   Vaping Use: Never used  Substance and Sexual Activity   Alcohol use: No    Alcohol/week: 0.0 standard drinks of alcohol   Drug use: No   Sexual activity: Yes    Birth control/protection: None  Other Topics Concern   Not on file  Social History Narrative   Married   Does not get regular exercise   Social Determinants of Health   Financial Resource Strain: Low Risk  (07/06/2022)   Overall Financial Resource Strain (CARDIA)    Difficulty of Paying Living Expenses: Not hard at all  Food Insecurity: No Food Insecurity (07/06/2022)   Hunger Vital Sign    Worried About Running Out of Food in the Last Year: Never true    Ran Out of Food in the Last Year: Never true  Transportation Needs: No Transportation Needs (07/06/2022)   PRAPARE - Transportation    Lack of Transportation (Medical): No    Lack of Transportation (Non-Medical): No  Physical Activity: Insufficiently Active (07/06/2022)   Exercise Vital Sign    Days of Exercise per Week: 2 days    Minutes of Exercise per Session: 30 min  Stress: No Stress Concern Present (07/06/2022)   [employer] of Occupational Health - Occupational Stress Questionnaire    Feeling of Stress : Not at all  Social Connections: Unknown (07/06/2022)   Social Connection and Isolation Panel [NHANES]    Frequency of Communication with Friends and Family: Not on file    Frequency of Social Gatherings with Friends and Family: Not on file    Attends Religious Services: Not on file    Active Member of Clubs or Organizations: Not on file    Attends Banker Meetings: Not on file    Marital Status:  Married    Tobacco Counseling Counseling given: Not Answered   Clinical Intake:  Pre-visit preparation completed: Yes        Diabetes: No  How often do you need to have someone help you when you read instructions, pamphlets, or other written materials from your doctor or pharmacy?: 4 - Often    Interpreter Needed?: No    Activities of Daily Living    07/06/2022   11:12 AM 02/26/2022   10:00 AM  In your present  state of health, do you have any difficulty performing the following activities:  Hearing? 0 0  Vision? 0 0  Difficulty concentrating or making decisions? 1 0  Comment Dx Cognitive impairment   Walking or climbing stairs? 1 1  Comment Paces self with walking and activity. Has a walker but holds husbands arm most of the time.   Dressing or bathing? 0 0  Doing errands, shopping? 1 0  Preparing Food and eating ? N   Using the Toilet? N   In the past six months, have you accidently leaked urine? Y   Comment Managed with daily pad/liner   Do you have problems with loss of bowel control? N   Managing your Medications? Y   Comment Husband assist   Managing your Finances? Y   Comment Husband assist   Housekeeping or managing your Housekeeping? Y   Comment Maid assist     Patient Care Team: Sherlene Shams, MD as PCP - General (Internal Medicine) Earna Coder, MD as Consulting Physician (Internal Medicine) Kieth Brightly, MD (General Surgery) Almond Lint, MD as Consulting Physician (Cardiology)  Indicate any recent Medical Services you may have received from other than [facility] providers in the past year (date may be approximate).     Assessment:   This is a routine wellness examination for Aldana.  I connected with  Henretta B [place] on 07/06/22 by a audio enabled telemedicine application and verified that I am speaking with the correct person using two identifiers.  Information also received from husband, HIPAA complaint.   Patient Location:  Home  Provider Location: Office/Clinic  I discussed the limitations of evaluation and management by telemedicine. The patient expressed understanding and agreed to proceed.   Hearing/Vision screen Hearing Screening - Comments:: Patient is able to hear conversational tones without difficulty. No issues reported. Vision Screening - Comments:: Followed by [facility] Wears corrective lenses They have seen their ophthalmologist in the last 12 months.    Dietary issues and exercise activities discussed: Current Exercise Habits: Home exercise routine, Time (Minutes): 30, Frequency (Times/Week): 2, Weekly Exercise (Minutes/Week): 60, Intensity: Mild Healthy diet   Goals Addressed             This Visit's Progress    Follow up with Primary Care Provider       As needed.       Depression Screen    07/06/2022   11:11 AM 05/16/2022    2:53 PM 06/29/2021    9:13 AM 04/20/2021   10:05 AM 06/28/2020   11:42 AM 04/19/2020    3:20 PM 12/17/2017    3:37 PM  PHQ 2/9 Scores  PHQ - 2 Score 0 0 0 1 0 1 0  PHQ- 9 Score      6 1    Fall Risk    07/06/2022   11:16 AM 05/16/2022    2:53 PM 03/13/2022    4:24 PM 06/29/2021    9:17 AM 05/09/2021    9:22 AM  Fall Risk   Falls in the past year? 1 1 0 0 0  Number falls in past yr:  1     Injury with Fall?  0     Risk for fall due to :  History of fall(s) Mental status change;History of fall(s)    Follow up Falls evaluation completed;Falls prevention discussed Falls evaluation completed Falls evaluation completed Falls evaluation completed     FALL RISK PREVENTION PERTAINING TO THE HOME: Home free  of loose throw rugs in walkways, pet beds, electrical cords, etc? Yes  Adequate lighting in your home to reduce risk of falls? Yes   ASSISTIVE DEVICES UTILIZED TO PREVENT FALLS: Life alert? No  Use of a cane, walker or w/c? Yes  Grab bars in the bathroom? Yes  Shower chair or bench in shower? Yes  Comfort chair height toilet? Yes   TIMED  UP AND GO: Was the test performed? No .   Cognitive Function: No intervention indicated.       07/06/2022   11:19 AM  6CIT Screen  What Year? 4 points  What month? 0 points  What time? 0 points  Count back from 20 0 points  Months in reverse 4 points  Repeat phrase 10 points  Total Score 18 points    Immunizations Immunization History  Administered Date(s) Administered   DTaP / Hep B / IPV 01/15/2018, 02/02/2019   HIB (PRP-T) 01/15/2018, 02/02/2019   Influenza Split 02/12/2011, 02/17/2012, 03/08/2014   Influenza Whole 05/14/2009   Influenza, High Dose Seasonal PF 02/17/2020, 02/02/2021   Influenza,inj,Quad PF,6+ Mos 01/28/2018, 02/02/2019, 05/16/2022   Influenza-Unspecified 03/13/2013, 01/22/2017, 01/28/2018   Janssen (J&J) SARS-COV-2 Vaccination 08/19/2019   MMR 12/22/2019   Moderna Sars-Covid-2 Vaccination 02/21/2020, 04/10/2021   Pneumococcal Conjugate-13 01/15/2018, 02/02/2019   Pneumococcal Polysaccharide-23 12/22/2019   Tdap 04/06/2014   Zoster Recombinat (Shingrix) 01/15/2018, 07/16/2018   Zoster, Live 07/18/2011   Screening Tests Health Maintenance  Topic Date Due   COVID-19 Vaccine (4 - 2023-24 season) 07/22/2022 (Originally 01/12/2022)   Medicare Annual Wellness (AWV)  07/07/2023   COLONOSCOPY (Pts 45-41yrs Insurance coverage will need to be confirmed)  06/19/2024   DTaP/Tdap/Td (4 - Td or Tdap) 02/01/2029   Pneumonia Vaccine 58+ Years old  Completed   INFLUENZA VACCINE  Completed   DEXA SCAN  Completed   Hepatitis C Screening  Completed   Zoster Vaccines- Shingrix  Completed   HPV VACCINES  Aged Out    Health Maintenance There are no preventive care reminders to display for this patient.  Lung Cancer Screening: (Low Dose CT Chest recommended if Age 43-80 years, 30 pack-year currently smoking OR have quit w/in 15years.) does not qualify.   Hepatitis C Screening: Completed 10/2015.  Vision Screening: Recommended annual ophthalmology exams for early  detection of glaucoma and other disorders of the eye.  Dental Screening: Recommended annual dental exams for proper oral hygiene  Community Resource Referral / Chronic Care Management: CRR required this visit?  No   CCM required this visit?  No      Plan:     I have personally reviewed and noted the following in the patient's chart:   Medical and social history Use of alcohol, tobacco or illicit drugs  Current medications and supplements including opioid prescriptions. Patient is not currently taking opioid prescriptions. Functional ability and status Nutritional status Physical activity Advanced directives List of other physicians Hospitalizations, surgeries, and ER visits in previous 12 months Vitals Screenings to include cognitive, depression, and falls Referrals and appointments  In addition, I have reviewed and discussed with patient certain preventive protocols, quality metrics, and best practice recommendations. A written personalized care plan for preventive services as well as general preventive health recommendations were provided to patient.     Corie Vavra L Motley, LPN   01/10/5620    I have reviewed the above information and agree with above.   Duncan Dull, MD

## 2022-07-30 ENCOUNTER — Ambulatory Visit
Admission: RE | Admit: 2022-07-30 | Discharge: 2022-07-30 | Disposition: A | Discharge status: Home / Self Care | Payer: Medicare Other | Source: Ambulatory Visit | Attending: Internal Medicine | Admitting: Internal Medicine

## 2022-07-30 DIAGNOSIS — C8333 Diffuse large B-cell lymphoma, intra-abdominal lymph nodes: Secondary | ICD-10-CM | POA: Insufficient documentation

## 2022-07-30 MED ORDER — GADOBUTROL 1 MMOL/ML IV SOLN
8.0000 mL | Freq: Once | INTRAVENOUS | Status: AC | PRN
  Administered 2022-07-30: 8 mL via INTRAVENOUS

## 2022-08-02 ENCOUNTER — Inpatient Hospital Stay: Payer: Medicare Other | Attending: Internal Medicine

## 2022-08-02 ENCOUNTER — Inpatient Hospital Stay (HOSPITAL_BASED_OUTPATIENT_CLINIC_OR_DEPARTMENT_OTHER): Payer: Medicare Other | Admitting: Internal Medicine

## 2022-08-02 ENCOUNTER — Encounter: Payer: Self-pay | Admitting: Internal Medicine

## 2022-08-02 VITALS — BP 120/79 | HR 76 | Temp 97.4°F | Resp 16 | Wt 196.8 lb

## 2022-08-02 DIAGNOSIS — R2689 Other abnormalities of gait and mobility: Secondary | ICD-10-CM | POA: Diagnosis not present

## 2022-08-02 DIAGNOSIS — Z923 Personal history of irradiation: Secondary | ICD-10-CM | POA: Diagnosis not present

## 2022-08-02 DIAGNOSIS — I951 Orthostatic hypotension: Secondary | ICD-10-CM | POA: Insufficient documentation

## 2022-08-02 DIAGNOSIS — C8583 Other specified types of non-Hodgkin lymphoma, intra-abdominal lymph nodes: Secondary | ICD-10-CM | POA: Diagnosis not present

## 2022-08-02 DIAGNOSIS — Z9221 Personal history of antineoplastic chemotherapy: Secondary | ICD-10-CM | POA: Insufficient documentation

## 2022-08-02 DIAGNOSIS — C8333 Diffuse large B-cell lymphoma, intra-abdominal lymph nodes: Secondary | ICD-10-CM

## 2022-08-02 DIAGNOSIS — Z9484 Stem cells transplant status: Secondary | ICD-10-CM | POA: Insufficient documentation

## 2022-08-02 DIAGNOSIS — C8599 Non-Hodgkin lymphoma, unspecified, extranodal and solid organ sites: Secondary | ICD-10-CM | POA: Diagnosis not present

## 2022-08-02 DIAGNOSIS — Z8572 Personal history of non-Hodgkin lymphomas: Secondary | ICD-10-CM | POA: Insufficient documentation

## 2022-08-02 LAB — CBC WITH DIFFERENTIAL/PLATELET
Abs Immature Granulocytes: 0.04 10*3/uL (ref 0.00–0.07)
Basophils Absolute: 0.1 10*3/uL (ref 0.0–0.1)
Basophils Relative: 1 %
Eosinophils Absolute: 0.1 10*3/uL (ref 0.0–0.5)
Eosinophils Relative: 1 %
HCT: 40.9 % (ref 36.0–46.0)
Hemoglobin: 13.7 g/dL (ref 12.0–15.0)
Immature Granulocytes: 1 %
Lymphocytes Relative: 16 %
Lymphs Abs: 1.1 10*3/uL (ref 0.7–4.0)
MCH: 30.4 pg (ref 26.0–34.0)
MCHC: 33.5 g/dL (ref 30.0–36.0)
MCV: 90.9 fL (ref 80.0–100.0)
Monocytes Absolute: 0.5 10*3/uL (ref 0.1–1.0)
Monocytes Relative: 7 %
Neutro Abs: 5.3 10*3/uL (ref 1.7–7.7)
Neutrophils Relative %: 74 %
Platelets: 172 10*3/uL (ref 150–400)
RBC: 4.5 MIL/uL (ref 3.87–5.11)
RDW: 14.2 % (ref 11.5–15.5)
WBC: 7 10*3/uL (ref 4.0–10.5)
nRBC: 0 % (ref 0.0–0.2)

## 2022-08-02 LAB — COMPREHENSIVE METABOLIC PANEL
ALT: 11 U/L (ref 0–44)
AST: 17 U/L (ref 15–41)
Albumin: 4.2 g/dL (ref 3.5–5.0)
Alkaline Phosphatase: 68 U/L (ref 38–126)
Anion gap: 10 (ref 5–15)
BUN: 13 mg/dL (ref 8–23)
CO2: 26 mmol/L (ref 22–32)
Calcium: 9.5 mg/dL (ref 8.9–10.3)
Chloride: 102 mmol/L (ref 98–111)
Creatinine, Ser: 1.23 mg/dL — ABNORMAL HIGH (ref 0.44–1.00)
GFR, Estimated: 48 mL/min — ABNORMAL LOW (ref >60)
Glucose, Bld: 126 mg/dL — ABNORMAL HIGH (ref 70–99)
Potassium: 4.1 mmol/L (ref 3.5–5.1)
Sodium: 138 mmol/L (ref 135–145)
Total Bilirubin: 0.7 mg/dL (ref 0.3–1.2)
Total Protein: 6.5 g/dL (ref 6.5–8.1)

## 2022-08-02 LAB — LACTATE DEHYDROGENASE: LDH: 134 U/L (ref 98–192)

## 2022-08-02 NOTE — Progress Notes (Signed)
Occasional sharp pain in her left and/or right side.

## 2022-08-02 NOTE — Assessment & Plan Note (Addendum)
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 S/p WBRT; MRI brain- March 2024- No evidence for residual or recurrent lymphoma; Stable atrophy and diffuse white matter disease consistent with prior whole brain radiation;  No acute intracranial abnormality or significant interval change.  # Patient prefers not to have MRI of the brain.  After multiple discussions back-and-forth -it was decided to hold off surveillance MRI.  However the patient is symptomatic would recommend MRI of the brain for further evaluation.  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 Mickeal Skinner- stable.   # Gait instability/orthostatic hypotension-s/p physical therapy; continue midodrine; stable.  # Bil UE/LE- cramping- ? etilogy-check mag; recommend Slow-Mag.  # Mediport-  Flushing; but not drawing blood.  Declines dye study; discussed regarding explantation.  Continue port flush for now. Pt declines evaluation with Dr.Dew Donivan Scull: anaesthesia/ neurocognitive defects].  They will call us if they change her mind.  # DISPOSITION: # port flush in 2 months; 4 month #Follow up in 6  months MD; labs- cbc/cmp/ldh; mag; ; port flush [double lumen]-Dr.B

## 2022-08-02 NOTE — Progress Notes (Signed)
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 CT- 1-2 CM LN subpectoral/medistinal/ retro-peritoneal/pelvic/ Right inguinal LN 1.6cm- Bx- DLBCL- ABC; myc-POS; FISH gene re-arragement-NEG. PET- MULTIPLE LN/ Bone involvement; R-CHOP x6- CR'# OCT 2017- PET scan- CR; DEC 22nd 2017- BMBx- "atypical large B cells- <1%  [[facility]; II opinion- Dr.Grover- review of path- not concerning for residual lymphoma]; recommend surviellance  # March 26th  2018PET- NED  # Lumbar puncture- difficult-spinal headache. S/p high dose MXT x4.  ------------------------------------------------------------------- # NOV 2018-recurrent diffuse large B-cell lymphoma [right axilla lymph node biopsy proven]; PET- multiple bone lesions; axillary adenopathy; splenomegaly uptake  # Nov 16th 2018- R-; s/p 3 cycles-CR' Auto-Stem cell Transplant date: 07/11/17 [facility]; Dr.Vincent]; May 1st 2019- PET CR.   # OCT 2020- MASS  spanning the corpus callosum. Brain Biopsy  at [facility]-DLBCL. CD20+. EBV -, Ki67+, Pax5+. Port placed 10/27, R-MPV started 10/27 [facility]; Dr.Grover]. s/p WBRT ; MARCH 31st- MRI- remission/Stable disease   ---------------------------------------------------------- # June 2017-Elevated LFTs- valacylovir/diflucan; resolved.   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   # May 2017- EF- 55-65%  #August 2019-double vision; secondary to refractive error-prism improved. 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   In the interim patient was evaluated by Dr. Mickeal Skinner neurology for neurocognitive impairment.  Patient continues to have difficulty with memory/executive functions.  Patient denies any recent hospitalizations.  Patient has weaned herself off gabapentin/has minimal postherpetic neuralgia.  Otherwise no headaches no nausea no vomiting.   Review of Systems  Constitutional:  Positive for malaise/fatigue. Negative for chills, diaphoresis, fever and weight loss.  HENT:  Negative for nosebleeds and sore throat.   Eyes:  Negative for double vision.  Respiratory:  Negative for cough, hemoptysis, sputum production and wheezing.   Cardiovascular:  Negative for chest pain, orthopnea and leg swelling.  Gastrointestinal:  Negative for abdominal pain, blood in stool, constipation, diarrhea, heartburn, melena, nausea and vomiting.  Genitourinary:  Negative for dysuria, frequency and  urgency.  Musculoskeletal:  Negative for back pain and joint pain.  Skin: Negative.  Negative for itching and rash.  Neurological:  Positive for dizziness. Negative for tingling, focal weakness and weakness.  Endo/Heme/Allergies:  Does not bruise/bleed easily.  Psychiatric/Behavioral:  Positive for memory loss. Negative for depression. The patient does not have insomnia.       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   Leg cramps    Lymphoma ([place]) 09/14/15   Monoclonal B cell lymphoma   Night sweats    Osteoporosis    Overweight(278.02)    Obesity   Palpitations    Shortness of breath dyspnea    Tachycardia    Thrombocytopenia (HCC)     PAST SURGICAL HISTORY :   Past Surgical History:  Procedure Laterality Date   ABDOMINAL HYSTERECTOMY  2005   BONE MARROW BIOPSY  09/14/15   CHOLECYSTECTOMY  1997   COLONOSCOPY  07/05/2014   ESOPHAGOGASTRODUODENOSCOPY  07/05/2014   INGUINAL LYMPH NODE BIOPSY Right 10/05/2015   Procedure: INGUINAL LYMPH NODE BIOPSY;  Surgeon: Christene Lye, MD;  Location: [facility] ORS;  Service: General;  Laterality: Right;   PERIPHERAL VASCULAR CATHETERIZATION N/A 09/27/2015   Procedure: Glori Luis Cath Insertion;  Surgeon: Algernon Huxley, MD;  Location: [place] CV LAB;  Service: Cardiovascular;  Laterality: N/A;   PORTA CATH REMOVAL Right 06/03/2017   Procedure: PORTA CATH REMOVAL, with venous thrombectomy;  Surgeon: Algernon Huxley, MD;  Location: [place] CV LAB;  Service: Cardiovascular;  Laterality: Right;   PORTACATH PLACEMENT Right     FAMILY HISTORY :   Family History  Problem Relation Age of Onset   Heart disease Father        CABG x 4   Multiple myeloma Father     Hypertension Mother    Pancreatic cancer Mother    Ulcers Mother    Asthma Mother    Brain cancer Maternal Uncle    Multiple myeloma Maternal Uncle    Diabetes Neg Hx    Breast cancer Neg Hx     SOCIAL HISTORY:   Social History   Tobacco Use   Smoking status: Never   Smokeless tobacco: Never  Vaping Use   Vaping Use: Never used  Substance Use Topics   Alcohol use: No    Alcohol/week: 0.0 standard drinks of alcohol   Drug use: No    ALLERGIES:  is allergic to ondansetron hcl, oxycodone, and rituximab.  MEDICATIONS:  Current Outpatient Medications  Medication Sig Dispense Refill   acetaminophen (TYLENOL) 650 MG CR tablet Take 650 mg by mouth every 8 (eight) hours as needed for pain.     midodrine (PROAMATINE) 5 MG tablet TAKE ONE AND ONE-HALF TABLETS THREE TIMES A DAY 180 tablet 1   ondansetron (ZOFRAN-ODT) 4 MG disintegrating tablet Take 1 tablet (4 mg total) by mouth every 8 (eight) hours as needed for nausea or vomiting. 60 tablet 2   Baclofen 5 MG TABS Take 5 mg by mouth 3 (three) times daily as needed. (Patient not taking: Reported on 05/16/2022) 90 tablet 1   No current facility-administered medications for this visit.   Facility-Administered Medications Ordered in Other Visits  Medication Dose Route Frequency Provider Last Rate Last Admin   0.9 %  sodium chloride infusion   Intravenous Continuous Evlyn Kanner, NP   Stopped at 10/14/15 1312  methotrexate (50 mg/ml) 6.3 g in sodium chloride 0.9 % 1,000 mL injection   Intravenous Once Charlaine Dalton R, MD       sodium chloride flush (NS) 0.9 % injection 10 mL  10 mL Intravenous PRN Cammie Sickle, MD   10 mL at 10/05/16 1400    PHYSICAL EXAMINATION: ECOG PERFORMANCE STATUS: 0 - Asymptomatic  BP 120/79 (BP Location: Left Arm, Patient Position: Sitting)   Pulse 76   Temp (!) 97.4 F (36.3 C) (Tympanic)   Resp 16   Wt 196 lb 12.8 oz (89.3 kg)   SpO2 98%   BMI 35.42 kg/m   Filed Weights    08/02/22 1000  Weight: 196 lb 12.8 oz (89.3 kg)   Physical Exam Constitutional:      Comments: Ambulating independently.  Accompanied by husband.    HENT:     Head: Normocephalic and atraumatic.     Mouth/Throat:     Pharynx: No oropharyngeal exudate.  Eyes:     Pupils: Pupils are equal, round, and reactive to light.  Cardiovascular:     Rate and Rhythm: Normal rate and regular rhythm.  Pulmonary:     Effort: Pulmonary effort is normal. No respiratory distress.     Breath sounds: Normal breath sounds. No wheezing.  Abdominal:     General: Bowel sounds are normal. There is no distension.     Palpations: Abdomen is soft. There is no mass.     Tenderness: There is no abdominal tenderness. There is no guarding or rebound.  Musculoskeletal:        General: No tenderness. Normal range of motion.     Cervical back: Normal range of motion and neck supple.  Skin:    General: Skin is warm.  Neurological:     Mental Status: She is alert and oriented to person, place, and time.  Psychiatric:        Mood and Affect: Affect normal.     LABORATORY DATA:  I have reviewed the data as listed    Component Value Date/Time   NA 138 03/30/2022 1044   NA 138 08/12/2019 0000   K 3.9 03/30/2022 1044   CL 102 03/30/2022 1044   CO2 25 03/30/2022 1044   GLUCOSE 135 (H) 03/30/2022 1044   BUN 13 03/30/2022 1044   BUN 8 08/12/2019 0000   CREATININE 1.35 (H) 03/30/2022 1044   CALCIUM 9.4 03/30/2022 1044   PROT 6.6 03/30/2022 1044   ALBUMIN 4.1 03/30/2022 1044   AST 19 03/30/2022 1044   ALT 12 03/30/2022 1044   ALKPHOS 72 03/30/2022 1044   BILITOT 0.7 03/30/2022 1044   GFRNONAA 43 (L) 03/30/2022 1044   GFRAA >60 11/24/2019 1442    No results found for: "SPEP", "UPEP"  Lab Results  Component Value Date   WBC 7.0 08/02/2022   NEUTROABS 5.3 08/02/2022   HGB 13.7 08/02/2022   HCT 40.9 08/02/2022   MCV 90.9 08/02/2022   PLT 172 08/02/2022      Chemistry      Component Value Date/Time    NA 138 03/30/2022 1044   NA 138 08/12/2019 0000   K 3.9 03/30/2022 1044   CL 102 03/30/2022 1044   CO2 25 03/30/2022 1044   BUN 13 03/30/2022 1044   BUN 8 08/12/2019 0000   CREATININE 1.35 (H) 03/30/2022 1044   GLU 88 08/12/2019 0000      Component Value Date/Time   CALCIUM 9.4 03/30/2022 1044   ALKPHOS 72 03/30/2022  1044   AST 19 03/30/2022 1044   ALT 12 03/30/2022 1044   BILITOT 0.7 03/30/2022 1044       RADIOGRAPHIC STUDIES: I have personally reviewed the radiological images as listed and agreed with the findings in the report. No results found.   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 S/p WBRT; MRI brain- March 2024- No evidence for residual or recurrent lymphoma; Stable atrophy and diffuse white matter disease consistent with prior whole brain radiation;  No acute intracranial abnormality or significant interval change.  # Patient prefers not to have MRI of the brain.  After multiple discussions back-and-forth -it was decided to hold off surveillance MRI.  However the patient is symptomatic would recommend MRI of the brain for further evaluation.  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 Mickeal Skinner- stable.   # Gait instability/orthostatic hypotension-s/p physical therapy; continue midodrine; stable.  # Bil UE/LE- cramping- ? etilogy-check mag; recommend Slow-Mag.  # Mediport-  Flushing; but not drawing blood.  Declines dye study; discussed regarding explantation.  Continue port flush for now. Pt declines evaluation with Dr.Dew Donivan Scull: anaesthesia/ neurocognitive defects].  They will call us if they change her mind.  # DISPOSITION: # port flush in 2 months; 4 month #Follow up in 6  months MD; labs- cbc/cmp/ldh; mag; ; port flush [double lumen]-Dr.B   Orders Placed This  Encounter  Procedures   CMP ([place] only)    Standing Status:   Future    Standing Expiration Date:   08/02/2023   CBC with Differential ([place] Only)    Standing Status:   Future    Standing Expiration Date:   07/10/2023   Magnesium    Standing Status:   Future    Standing Expiration Date:   08/02/2023   Lactate dehydrogenase    Standing Status:   Future    Standing Expiration Date:   08/02/2023    All questions were answered. The patient knows to call the clinic with any problems, questions or concerns.      Cammie Sickle, MD 08/02/2022 11:34 AM

## 2022-08-09 NOTE — Telephone Encounter (Signed)
My Chart message sent

## 2022-08-15 ENCOUNTER — Other Ambulatory Visit: Payer: Self-pay | Admitting: Internal Medicine

## 2022-10-02 ENCOUNTER — Inpatient Hospital Stay: Payer: Medicare Other | Attending: Internal Medicine

## 2022-10-02 DIAGNOSIS — Z8572 Personal history of non-Hodgkin lymphomas: Secondary | ICD-10-CM | POA: Insufficient documentation

## 2022-10-02 DIAGNOSIS — Z452 Encounter for adjustment and management of vascular access device: Secondary | ICD-10-CM | POA: Diagnosis present

## 2022-10-02 DIAGNOSIS — Z95828 Presence of other vascular implants and grafts: Secondary | ICD-10-CM

## 2022-10-02 MED ORDER — HEPARIN SOD (PORK) LOCK FLUSH 100 UNIT/ML IV SOLN
500.0000 [IU] | Freq: Once | INTRAVENOUS | Status: AC
  Administered 2022-10-02: 500 [IU] via INTRAVENOUS
  Filled 2022-10-02: qty 5

## 2022-10-02 MED ORDER — SODIUM CHLORIDE 0.9% FLUSH
10.0000 mL | Freq: Once | INTRAVENOUS | Status: AC
  Administered 2022-10-02: 10 mL via INTRAVENOUS
  Filled 2022-10-02: qty 10

## 2022-11-09 ENCOUNTER — Other Ambulatory Visit: Payer: Self-pay | Admitting: Internal Medicine

## 2022-12-03 ENCOUNTER — Inpatient Hospital Stay: Payer: Medicare Other | Attending: Internal Medicine

## 2022-12-03 DIAGNOSIS — Z95828 Presence of other vascular implants and grafts: Secondary | ICD-10-CM

## 2022-12-03 DIAGNOSIS — Z8572 Personal history of non-Hodgkin lymphomas: Secondary | ICD-10-CM | POA: Insufficient documentation

## 2022-12-03 DIAGNOSIS — Z452 Encounter for adjustment and management of vascular access device: Secondary | ICD-10-CM | POA: Insufficient documentation

## 2022-12-03 MED ORDER — HEPARIN SOD (PORK) LOCK FLUSH 100 UNIT/ML IV SOLN
500.0000 [IU] | Freq: Once | INTRAVENOUS | Status: AC
  Administered 2022-12-03: 500 [IU] via INTRAVENOUS
  Filled 2022-12-03: qty 5

## 2022-12-03 MED ORDER — SODIUM CHLORIDE 0.9% FLUSH
10.0000 mL | Freq: Once | INTRAVENOUS | Status: AC
  Administered 2022-12-03: 10 mL via INTRAVENOUS
  Filled 2022-12-03: qty 10

## 2023-01-15 ENCOUNTER — Encounter: Payer: Self-pay | Admitting: Internal Medicine

## 2023-01-16 ENCOUNTER — Inpatient Hospital Stay (HOSPITAL_BASED_OUTPATIENT_CLINIC_OR_DEPARTMENT_OTHER): Payer: Medicare Other | Admitting: Hospice and Palliative Medicine

## 2023-01-16 ENCOUNTER — Other Ambulatory Visit: Payer: Self-pay

## 2023-01-16 ENCOUNTER — Inpatient Hospital Stay: Payer: Medicare Other | Attending: Internal Medicine

## 2023-01-16 ENCOUNTER — Encounter: Payer: Self-pay | Admitting: Hospice and Palliative Medicine

## 2023-01-16 VITALS — BP 130/81 | HR 70 | Temp 97.8°F | Resp 18 | Ht 62.5 in | Wt 196.0 lb

## 2023-01-16 DIAGNOSIS — Z8572 Personal history of non-Hodgkin lymphomas: Secondary | ICD-10-CM | POA: Diagnosis present

## 2023-01-16 DIAGNOSIS — R252 Cramp and spasm: Secondary | ICD-10-CM | POA: Diagnosis not present

## 2023-01-16 DIAGNOSIS — Z86718 Personal history of other venous thrombosis and embolism: Secondary | ICD-10-CM | POA: Insufficient documentation

## 2023-01-16 DIAGNOSIS — F05 Delirium due to known physiological condition: Secondary | ICD-10-CM

## 2023-01-16 DIAGNOSIS — Z800 Family history of malignant neoplasm of digestive organs: Secondary | ICD-10-CM | POA: Insufficient documentation

## 2023-01-16 DIAGNOSIS — C8583 Other specified types of non-Hodgkin lymphoma, intra-abdominal lymph nodes: Secondary | ICD-10-CM | POA: Diagnosis not present

## 2023-01-16 DIAGNOSIS — E538 Deficiency of other specified B group vitamins: Secondary | ICD-10-CM | POA: Diagnosis not present

## 2023-01-16 DIAGNOSIS — R451 Restlessness and agitation: Secondary | ICD-10-CM | POA: Diagnosis not present

## 2023-01-16 DIAGNOSIS — R410 Disorientation, unspecified: Secondary | ICD-10-CM | POA: Diagnosis not present

## 2023-01-16 DIAGNOSIS — Z808 Family history of malignant neoplasm of other organs or systems: Secondary | ICD-10-CM | POA: Insufficient documentation

## 2023-01-16 DIAGNOSIS — R2689 Other abnormalities of gait and mobility: Secondary | ICD-10-CM | POA: Insufficient documentation

## 2023-01-16 DIAGNOSIS — G3184 Mild cognitive impairment, so stated: Secondary | ICD-10-CM

## 2023-01-16 DIAGNOSIS — Z807 Family history of other malignant neoplasms of lymphoid, hematopoietic and related tissues: Secondary | ICD-10-CM | POA: Insufficient documentation

## 2023-01-16 DIAGNOSIS — I951 Orthostatic hypotension: Secondary | ICD-10-CM | POA: Diagnosis not present

## 2023-01-16 DIAGNOSIS — R5383 Other fatigue: Secondary | ICD-10-CM

## 2023-01-16 LAB — CMP (CANCER CENTER ONLY)
ALT: 15 U/L (ref 0–44)
AST: 15 U/L (ref 15–41)
Albumin: 4.4 g/dL (ref 3.5–5.0)
Alkaline Phosphatase: 72 U/L (ref 38–126)
Anion gap: 9 (ref 5–15)
BUN: 17 mg/dL (ref 8–23)
CO2: 25 mmol/L (ref 22–32)
Calcium: 9.3 mg/dL (ref 8.9–10.3)
Chloride: 103 mmol/L (ref 98–111)
Creatinine: 1.15 mg/dL — ABNORMAL HIGH (ref 0.44–1.00)
GFR, Estimated: 52 mL/min — ABNORMAL LOW (ref >60)
Glucose, Bld: 120 mg/dL — ABNORMAL HIGH (ref 70–99)
Potassium: 3.7 mmol/L (ref 3.5–5.1)
Sodium: 137 mmol/L (ref 135–145)
Total Bilirubin: 0.4 mg/dL (ref 0.3–1.2)
Total Protein: 6.6 g/dL (ref 6.5–8.1)

## 2023-01-16 LAB — CBC WITH DIFFERENTIAL (CANCER CENTER ONLY)
Abs Immature Granulocytes: 0.04 10*3/uL (ref 0.00–0.07)
Basophils Absolute: 0.1 10*3/uL (ref 0.0–0.1)
Basophils Relative: 1 %
Eosinophils Absolute: 0.1 10*3/uL (ref 0.0–0.5)
Eosinophils Relative: 1 %
HCT: 41.6 % (ref 36.0–46.0)
Hemoglobin: 13.8 g/dL (ref 12.0–15.0)
Immature Granulocytes: 1 %
Lymphocytes Relative: 20 %
Lymphs Abs: 1.5 10*3/uL (ref 0.7–4.0)
MCH: 30.3 pg (ref 26.0–34.0)
MCHC: 33.2 g/dL (ref 30.0–36.0)
MCV: 91.2 fL (ref 80.0–100.0)
Monocytes Absolute: 0.5 10*3/uL (ref 0.1–1.0)
Monocytes Relative: 7 %
Neutro Abs: 5.3 10*3/uL (ref 1.7–7.7)
Neutrophils Relative %: 70 %
Platelet Count: 188 10*3/uL (ref 150–400)
RBC: 4.56 MIL/uL (ref 3.87–5.11)
RDW: 12.9 % (ref 11.5–15.5)
WBC Count: 7.5 10*3/uL (ref 4.0–10.5)
nRBC: 0 % (ref 0.0–0.2)

## 2023-01-16 LAB — MAGNESIUM: Magnesium: 2.3 mg/dL (ref 1.7–2.4)

## 2023-01-16 LAB — URINALYSIS, COMPLETE (UACMP) WITH MICROSCOPIC
Bilirubin Urine: NEGATIVE
Glucose, UA: NEGATIVE mg/dL
Ketones, ur: NEGATIVE mg/dL
Nitrite: NEGATIVE
Protein, ur: NEGATIVE mg/dL
Specific Gravity, Urine: 1.026 (ref 1.005–1.030)
WBC, UA: >50 WBC/hpf (ref 0–5)
pH: 5 (ref 5.0–8.0)

## 2023-01-16 LAB — VITAMIN B12: Vitamin B-12: 152 pg/mL — ABNORMAL LOW (ref 180–914)

## 2023-01-16 LAB — LACTATE DEHYDROGENASE: LDH: 120 U/L (ref 98–192)

## 2023-01-16 NOTE — Progress Notes (Signed)
Pt's port not accessed today. Peripheral labs drawn. Port does not give blood return for patient. Pt declined port flush.

## 2023-01-16 NOTE — Telephone Encounter (Signed)
Apt scheduled with Josh,np

## 2023-01-16 NOTE — Progress Notes (Signed)
Symptom Management Clinic [facility] at [facility] [phone] [phone]  Patient Care Team: Sherlene Shams, MD as PCP - General (Internal Medicine) Earna Coder, MD as Consulting Physician (Internal Medicine) Kieth Brightly, MD (General Surgery) Almond Lint, MD as Consulting Physician (Cardiology)   NAME OF PATIENT: Stacy Murillo  [id number]  May 09, 1956   DATE OF VISIT: 01/16/23  REASON FOR CONSULT: Stacy Murillo is a 67 y.o. 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    INTERVAL HISTORY: Patient has history of neurocognitive impairment secondary to chemotherapy/radiation for which she has been previously evaluated by neuro oncology.  No further CNS workup or cognitive screening was recommended and home resources were discussed when patient was seen by Dr. Barbaraann Cao in 04/27/2022.  Patient last saw Dr. Donneta Romberg on 08/02/2022 at which time patient symptoms were stable and patient and family deferred surveillance MRI.  Patient was scheduled for follow-up visit with Dr. Donneta Romberg on 02/01/2023.  However, has been requested visit sooner due to increased confusion and agitation.  However, patient strongly denies any symptomatic complaints today.  She does admit to having occasional confusion and at times gets angry with her husband but does not feel like her symptoms have changed or worsen in any way.  She denies any headaches, visual changes, auditory changes, dizziness, changes in appetite, pain, GI or GU symptoms.   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   Leg cramps    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   Night sweats    Osteoporosis    Overweight(278.02)    Obesity   Palpitations    Shortness of breath dyspnea    Tachycardia    Thrombocytopenia (HCC)     PAST SURGICAL HISTORY:  Past Surgical History:  Procedure Laterality Date   ABDOMINAL HYSTERECTOMY  2005   BONE MARROW BIOPSY  09/14/15   CHOLECYSTECTOMY  1997   COLONOSCOPY  07/05/2014   ESOPHAGOGASTRODUODENOSCOPY  07/05/2014   INGUINAL LYMPH NODE BIOPSY Right 10/05/2015   Procedure: INGUINAL LYMPH NODE BIOPSY;  Surgeon: Kieth Brightly, MD;  Location: [facility] ORS;  Service: General;  Laterality: Right;   PERIPHERAL VASCULAR CATHETERIZATION N/A 09/27/2015   Procedure: Shelda Pal Cath Insertion;  Surgeon: Annice Needy, MD;  Location: [facility] INVASIVE CV LAB;  Service: Cardiovascular;  Laterality: N/A;   PORTA CATH REMOVAL Right 06/03/2017   Procedure: PORTA CATH REMOVAL, with venous thrombectomy;  Surgeon: Annice Needy, MD;  Location: [facility] INVASIVE CV LAB;  Service: Cardiovascular;  Laterality: Right;   PORTACATH PLACEMENT Right     HEMATOLOGY/ONCOLOGY HISTORY:  Oncology History Overview Note  #MAY 2017- 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 CT- 1-2 CM LN subpectoral/medistinal/ retro-peritoneal/pelvic/ Right inguinal LN 1.6cm- Bx- DLBCL- ABC; myc-POS; FISH gene re-arragement-NEG. PET- MULTIPLE LN/ Bone involvement; R-CHOP x6- CR'# OCT 2017- PET scan- CR; DEC 22nd 2017- BMBx- "  atypical large B cells- <1%  [[facility]; II opinion- Dr.Grover- review of path- not concerning for residual lymphoma]; recommend surviellance  # March 26th  2018PET- NED  # Lumbar puncture- difficult-spinal headache. S/p high  dose MXT x4.  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   # OCT 2020- MASS  spanning the corpus callosum. Brain Biopsy  at [facility]-DLBCL. CD20+. EBV -, Ki67+, Pax5+. Port placed 10/27, R-MPV started 10/27 [facility]; Dr.Grover]. s/p WBRT ; MARCH 31st- MRI- remission/Stable disease  ---------------------------------------------------------- # June 2017-Elevated LFTs- valacylovir/diflucan; resolved.   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   # May 2017- EF- 55-65%  #August 2019-double vision; secondary to refractive error-prism improved. 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  MEDICATIONS:  Current Outpatient Medications  Medication Sig Dispense Refill   acetaminophen (TYLENOL) 650 MG CR tablet Take 650 mg by mouth every 8 (eight) hours as needed for pain.     Baclofen 5 MG TABS Take 5 mg by mouth 3 (three) times daily as needed. (Patient not taking: Reported on 05/16/2022) 90 tablet 1   midodrine (PROAMATINE) 5 MG tablet TAKE ONE AND ONE-HALF TABLETS THREE TIMES A DAY 180 tablet 1   ondansetron (ZOFRAN-ODT) 4 MG disintegrating tablet Take 1 tablet (4 mg total) by mouth every 8 (eight) hours as needed for nausea or vomiting. 60 tablet 2   No  current facility-administered medications for this visit.   Facility-Administered Medications Ordered in Other Visits  Medication Dose Route Frequency Provider Last Rate Last Admin   0.9 %  sodium chloride infusion   Intravenous Continuous Herring, Vear Clock, NP   Stopped at 10/14/15 1312   methotrexate (50 mg/ml) 6.3 g in sodium chloride 0.9 % 1,000 mL injection   Intravenous Once Louretta Shorten R, MD       sodium chloride flush (NS) 0.9 % injection 10 mL  10 mL Intravenous PRN Earna Coder, MD   10 mL at 10/05/16 1400    VITAL SIGNS: There were no vitals taken for this visit. There were no vitals filed for this visit.  Estimated body mass index is 35.42 kg/m as calculated from the following:   Height as of 07/06/22: 5' 2.5" (1.588 m).   Weight as of 08/02/22: 196 lb 12.8 oz (89.3 kg).  LABS: CBC:    Component Value Date/Time   WBC 7.0 08/02/2022 1040   HGB 13.7 08/02/2022 1040   HCT 40.9 08/02/2022 1040   PLT 172 08/02/2022 1040   MCV 90.9 08/02/2022 1040   NEUTROABS 5.3 08/02/2022 1040   NEUTROABS 4 08/12/2019 0000   LYMPHSABS 1.1 08/02/2022 1040   MONOABS 0.5 08/02/2022 1040   EOSABS 0.1 08/02/2022 1040   BASOSABS 0.1 08/02/2022 1040   Comprehensive Metabolic Panel:    Component Value Date/Time   NA 138 08/02/2022 1040   NA 138 08/12/2019 0000  K 4.1 08/02/2022 1040   CL 102 08/02/2022 1040   CO2 26 08/02/2022 1040   BUN 13 08/02/2022 1040   BUN 8 08/12/2019 0000   CREATININE 1.23 (H) 08/02/2022 1040   GLUCOSE 126 (H) 08/02/2022 1040   CALCIUM 9.5 08/02/2022 1040   AST 17 08/02/2022 1040   ALT 11 08/02/2022 1040   ALKPHOS 68 08/02/2022 1040   BILITOT 0.7 08/02/2022 1040   PROT 6.5 08/02/2022 1040   ALBUMIN 4.2 08/02/2022 1040    RADIOGRAPHIC STUDIES: No results found.  PERFORMANCE STATUS (ECOG) : 1 - Symptomatic but completely ambulatory  Review of Systems Unless otherwise noted, a complete review of systems is negative.  Physical  Exam General: NAD Cardiovascular: regular rate and rhythm Pulmonary: clear anterior/posterior fields Abdomen: soft, nontender, + bowel sounds GU: no suprapubic tenderness Extremities: no edema, no joint deformities Skin: no rashes Neurological: Weakness but otherwise nonfocal  IMPRESSION/PLAN: Stage IV diffuse large B-cell lymphoma -on disease surveillance.  No B symptoms today. Husband endorses some cognitive and memory changes but patient adamantly denies this.  Discussed obtaining repeat MRI of the brain but patient strongly declines recommendation. She was in agreement with referral to community palliative care program.   Cognitive changes -no acute deficits found on neuroexam.  Will obtain labs today and check UA/culture.  Again, discussed option for workup including MRI of the brain but patient declined this.  Patient also not interested in establishing with outpatient neurology.  Agitation -patient did describe some periods of anger.  Denies depression.  Patient declined trial of SSRI. Also declined referral to counseling.   Caregiver strain - referral to community palliative care to discuss resources. I would also like SW to meet patient/husband when they return in 2 weeks.   Follow-up with Dr. Donneta Romberg in 2 weeks   Patient expressed understanding and was in agreement with this plan. She also understands that She can call clinic at any time with any questions, concerns, or complaints.   Thank you for allowing me to participate in the care of this very pleasant patient.   Time Total: 30 minutes  Visit consisted of counseling and education dealing with the complex and emotionally intense issues of symptom management in the setting of serious illness.Greater than 50%  of this time was spent counseling and coordinating care related to the above assessment and plan.  Signed by: Laurette Schimke, PhD, NP-C

## 2023-01-17 ENCOUNTER — Other Ambulatory Visit: Payer: Self-pay | Admitting: Hospice and Palliative Medicine

## 2023-01-17 DIAGNOSIS — F05 Delirium due to known physiological condition: Secondary | ICD-10-CM

## 2023-01-17 DIAGNOSIS — E538 Deficiency of other specified B group vitamins: Secondary | ICD-10-CM

## 2023-01-17 MED ORDER — CEPHALEXIN 500 MG PO CAPS: 500.0000 mg | ORAL_CAPSULE | Freq: Two times a day (BID) | ORAL | 0 refills | Status: DC

## 2023-01-17 MED ORDER — VITAMIN B-12 1000 MCG PO TABS: 1000.0000 ug | ORAL_TABLET | Freq: Every day | ORAL | 2 refills | Status: DC

## 2023-01-17 NOTE — Progress Notes (Signed)
I called and spoke with patient's husband regarding labs.  UA appears consistent with UTI.  Urine culture pending.  Will start patient on Keflex 500 mg twice daily x 5 days.  Patient also noted to have B12 deficiency.  Discussed with Dr. Donneta Romberg will start patient on oral B12 1000 mcg daily.  Will recheck B12 at time of next clinic visit.

## 2023-01-18 ENCOUNTER — Encounter: Payer: Self-pay | Admitting: *Deleted

## 2023-01-18 ENCOUNTER — Inpatient Hospital Stay: Payer: Medicare Other

## 2023-01-18 LAB — URINE CULTURE: Culture: >=100000 — AB

## 2023-01-18 NOTE — Progress Notes (Signed)
[facility] CSW Progress Note  Visual merchandiser contacted patient's husband, Gabriel Rung, per the request of Josh Borders.  CSW introduced self and reason for call, requesting a visit in two weeks after Dr. Keith Rake visit.  Joe agreed and meeting is scheduled for 10:45 on 9/26.  He did not have any questions at this time.   Dorothey Baseman, LCSW Clinical Social Worker [facility]

## 2023-01-28 ENCOUNTER — Encounter: Payer: Self-pay | Admitting: Internal Medicine

## 2023-01-30 ENCOUNTER — Other Ambulatory Visit: Payer: Self-pay | Admitting: Internal Medicine

## 2023-02-01 ENCOUNTER — Other Ambulatory Visit: Payer: Self-pay

## 2023-02-01 ENCOUNTER — Inpatient Hospital Stay (HOSPITAL_BASED_OUTPATIENT_CLINIC_OR_DEPARTMENT_OTHER): Payer: Medicare Other | Admitting: Internal Medicine

## 2023-02-01 ENCOUNTER — Inpatient Hospital Stay: Payer: Medicare Other

## 2023-02-01 VITALS — BP 115/71 | HR 65 | Temp 98.6°F | Wt 204.1 lb

## 2023-02-01 DIAGNOSIS — Z8572 Personal history of non-Hodgkin lymphomas: Secondary | ICD-10-CM | POA: Diagnosis not present

## 2023-02-01 DIAGNOSIS — E538 Deficiency of other specified B group vitamins: Secondary | ICD-10-CM

## 2023-02-01 DIAGNOSIS — F05 Delirium due to known physiological condition: Secondary | ICD-10-CM

## 2023-02-01 DIAGNOSIS — R300 Dysuria: Secondary | ICD-10-CM | POA: Diagnosis not present

## 2023-02-01 DIAGNOSIS — C8599 Non-Hodgkin lymphoma, unspecified, extranodal and solid organ sites: Secondary | ICD-10-CM | POA: Diagnosis not present

## 2023-02-01 DIAGNOSIS — E860 Dehydration: Secondary | ICD-10-CM

## 2023-02-01 LAB — CBC (CANCER CENTER ONLY)
HCT: 39.6 % (ref 36.0–46.0)
Hemoglobin: 13.2 g/dL (ref 12.0–15.0)
MCH: 30.2 pg (ref 26.0–34.0)
MCHC: 33.3 g/dL (ref 30.0–36.0)
MCV: 90.6 fL (ref 80.0–100.0)
Platelet Count: 169 10*3/uL (ref 150–400)
RBC: 4.37 MIL/uL (ref 3.87–5.11)
RDW: 12.8 % (ref 11.5–15.5)
WBC Count: 7.1 10*3/uL (ref 4.0–10.5)
nRBC: 0 % (ref 0.0–0.2)

## 2023-02-01 LAB — CMP (CANCER CENTER ONLY)
ALT: 12 U/L (ref 0–44)
AST: 13 U/L — ABNORMAL LOW (ref 15–41)
Albumin: 4 g/dL (ref 3.5–5.0)
Alkaline Phosphatase: 68 U/L (ref 38–126)
Anion gap: 7 (ref 5–15)
BUN: 17 mg/dL (ref 8–23)
CO2: 25 mmol/L (ref 22–32)
Calcium: 8.9 mg/dL (ref 8.9–10.3)
Chloride: 104 mmol/L (ref 98–111)
Creatinine: 1.19 mg/dL — ABNORMAL HIGH (ref 0.44–1.00)
GFR, Estimated: 50 mL/min — ABNORMAL LOW (ref >60)
Glucose, Bld: 109 mg/dL — ABNORMAL HIGH (ref 70–99)
Potassium: 3.6 mmol/L (ref 3.5–5.1)
Sodium: 136 mmol/L (ref 135–145)
Total Bilirubin: 0.6 mg/dL (ref 0.3–1.2)
Total Protein: 6.2 g/dL — ABNORMAL LOW (ref 6.5–8.1)

## 2023-02-01 LAB — URINALYSIS, COMPLETE (UACMP) WITH MICROSCOPIC
Bilirubin Urine: NEGATIVE
Glucose, UA: NEGATIVE mg/dL
Ketones, ur: 5 mg/dL — AB
Nitrite: NEGATIVE
Protein, ur: NEGATIVE mg/dL
Specific Gravity, Urine: 1.027 (ref 1.005–1.030)
WBC, UA: >50 WBC/hpf (ref 0–5)
pH: 5 (ref 5.0–8.0)

## 2023-02-01 LAB — TSH: TSH: 5.027 u[IU]/mL — ABNORMAL HIGH (ref 0.350–4.500)

## 2023-02-01 LAB — MAGNESIUM: Magnesium: 2.2 mg/dL (ref 1.7–2.4)

## 2023-02-01 LAB — VITAMIN B12: Vitamin B-12: 624 pg/mL (ref 180–914)

## 2023-02-01 NOTE — Assessment & Plan Note (Addendum)
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 S/p WBRT; MRI brain- March 2024- No evidence for residual or recurrent lymphoma; Stable atrophy and diffuse white matter disease consistent with prior whole brain radiation;  No acute intracranial abnormality or significant interval change.  # No acute worsening of mental status changes [but a gradual decline noted-see below].  Hold off any brain imaging patient previously declined MRI brain.  # Delirium- from UTI- s/p antibiotics- would check  UA/culture today.   # Neurocognitive deficits/motor deficits gait instability-secondary to sequela of chemotherapy/radiation- [s/p evaluation with Dr. Marlon Pel progressive/worsening.  I had a long discussion again with the patient and family [husband and daughter]-regarding the progressive nature of worsening neuro cognitive deficits.  Interested in the evaluation with neurology Dr. Barbaraann Cao.  I reached out to social worker regarding follow-up-given family's ongoing concerns with adjustment to the patient's overall general decline.  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  Awaiting B12 today.  # Gait instability/orthostatic hypotension-s/p physical therapy; continue midodrine-stable.  # Chronic bilateral lower extremity cramping left more than right.  Electrolytes within normal limits-discussed regarding ultrasound of the left lower extremity [given the tenderness/mild swelling-question chronic].  Declined ultrasound.  # Mediport-  Flushing; but not drawing blood.  Declines dye study; discussed regarding explantation.  Continue port flush for now. Pt declines evaluation with Dr.Dew Fonnie Birkenhead: anaesthesia/ neurocognitive defects].  They will call us if they change her mind.  # DISPOSITION: # make appt with Dr.Vaslow  # port flush in 3 months -  #Follow up in 6  months MD; labs- cbc/cmp/ldh; mag; B12 levels; port flush [double  lumen]-Dr.B  # 25 minutes face-to-face with the patient discussing the above plan of care; more than 50% of time spent on prognosis/ natural history; counseling and coordination.

## 2023-02-01 NOTE — Progress Notes (Signed)
[facility] OFFICE PROGRESS NOTE  Patient Care Team: Sherlene Shams, MD as PCP - General (Internal Medicine) Earna Coder, MD as Consulting Physician (Internal Medicine) Kieth Brightly, MD (General Surgery) Almond Lint, MD as Consulting Physician (Cardiology)   Cancer Staging  Large cell lymphoma of intra-abdominal lymph nodes [facility]) Staging form: Lymphoid Neoplasms, AJCC 6th Edition - Clinical stage from 09/13/2015: Stage IV - Signed by Earna Coder, MD on 10/06/2015 Specimen type: Core Needle Biopsy    Oncology History Overview Note  #MAY 2017- 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 CT- 1-2 CM LN subpectoral/medistinal/ retro-peritoneal/pelvic/ Right inguinal LN 1.6cm- Bx- DLBCL- ABC; myc-POS; FISH gene re-arragement-NEG. PET- MULTIPLE LN/ Bone involvement; R-CHOP x6- CR'# OCT 2017- PET scan- CR; DEC 22nd 2017- BMBx- "atypical large B cells- <1%  [[facility]; II opinion- Dr.Grover- review of path- not concerning for residual lymphoma]; recommend surviellance  # March 26th  2018PET- NED  # Lumbar puncture- difficult-spinal headache. S/p high dose MXT x4.  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   # OCT 2020- MASS  spanning the corpus callosum. Brain Biopsy  at [facility]-DLBCL. CD20+. EBV -, Ki67+, Pax5+. Port placed 10/27, R-MPV started 10/27 [facility]; Dr.Grover]. s/p WBRT ; MARCH 31st- MRI- remission/Stable disease   ---------------------------------------------------------- # June 2017-Elevated LFTs- valacylovir/diflucan; resolved.   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   # May 2017- EF- 55-65%  #August 2019-double vision; secondary to refractive error-prism improved. 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 Alone/Accompanied by husband  Stacy Murillo 67 y.o.  female pleasant patient above history of relapsed diffuse large B-cell lymphoma in the brain-currently s/p R-methotrexate procarbazine/steroid; also status post consolidation radiation- currently on surveillance is here for follow-up.   Patient finished her UTI antibiotic/medication,  Patient has been feeling more off balance, fatigue, along swollen ankle and shortness of breath for more than a few weeks    Patient continues to have difficulty with memory/executive functions-progressively worse as per family.  Patient denies any recent hospitalizations.  Patient has weaned herself off gabapentin/has minimal postherpetic neuralgia.  Otherwise no headaches no nausea no vomiting.   Review of Systems  Constitutional:  Positive for malaise/fatigue. Negative for chills, diaphoresis, fever and weight loss.  HENT:  Negative for nosebleeds and sore throat.   Eyes:  Negative for double vision.  Respiratory:  Negative for cough, hemoptysis, sputum production and wheezing.   Cardiovascular:  Negative for chest pain, orthopnea and leg swelling.  Gastrointestinal:  Negative for abdominal pain, blood in stool,  constipation, diarrhea, heartburn, melena, nausea and vomiting.  Genitourinary:  Negative for dysuria, frequency and urgency.  Musculoskeletal:  Negative for back pain and joint pain.  Skin:  Negative.  Negative for itching and rash.  Neurological:  Positive for dizziness. Negative for tingling, focal weakness and weakness.  Endo/Heme/Allergies:  Does not bruise/bleed easily.  Psychiatric/Behavioral:  Positive for memory loss. Negative for depression. The patient does not have insomnia.       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   Leg cramps    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   Night sweats    Osteoporosis    Overweight(278.02)    Obesity   Palpitations    Shortness of breath dyspnea    Tachycardia    Thrombocytopenia (HCC)     PAST SURGICAL HISTORY :   Past Surgical History:  Procedure Laterality Date   ABDOMINAL HYSTERECTOMY  2005   BONE MARROW BIOPSY  09/14/15   CHOLECYSTECTOMY  1997   COLONOSCOPY  07/05/2014   ESOPHAGOGASTRODUODENOSCOPY  07/05/2014   INGUINAL LYMPH NODE BIOPSY Right 10/05/2015   Procedure: INGUINAL LYMPH NODE BIOPSY;  Surgeon: Kieth Brightly, MD;  Location: [facility] ORS;  Service: General;  Laterality: Right;   PERIPHERAL VASCULAR CATHETERIZATION N/A 09/27/2015   Procedure: Shelda Pal Cath Insertion;  Surgeon: Annice Needy, MD;  Location: [facility] INVASIVE CV LAB;  Service: Cardiovascular;  Laterality: N/A;   PORTA CATH REMOVAL Right 06/03/2017   Procedure: PORTA CATH REMOVAL, with venous thrombectomy;  Surgeon: Annice Needy, MD;  Location: [facility] INVASIVE CV LAB;  Service: Cardiovascular;  Laterality: Right;   PORTACATH PLACEMENT Right     FAMILY HISTORY :    Family History  Problem Relation Age of Onset   Heart disease Father        CABG x 4   Multiple myeloma Father    Hypertension Mother    Pancreatic cancer Mother    Ulcers Mother    Asthma Mother    Brain cancer Maternal Uncle    Multiple myeloma Maternal Uncle    Diabetes Neg Hx    Breast cancer Neg Hx     SOCIAL HISTORY:   Social History   Tobacco Use   Smoking status: Never   Smokeless tobacco: Never  Vaping Use   Vaping status: Never Used  Substance Use Topics   Alcohol use: No    Alcohol/week: 0.0 standard drinks of alcohol   Drug use: No    ALLERGIES:  is allergic to ondansetron hcl, oxycodone, and rituximab.  MEDICATIONS:  Current Outpatient Medications  Medication Sig Dispense Refill   acetaminophen (TYLENOL) 650 MG CR tablet Take 650 mg by mouth every 8 (eight) hours as needed for pain.     cephALEXin (KEFLEX) 500 MG capsule Take 1 capsule (500 mg total) by mouth 2 (two) times daily. 10 capsule 0   cyanocobalamin (VITAMIN B12) 1000 MCG tablet Take 1 tablet (1,000 mcg total) by mouth daily. 30 tablet 2   midodrine (PROAMATINE) 5 MG tablet TAKE ONE AND ONE-HALF TABLETS THREE TIMES A DAY 180 tablet 1   ondansetron (ZOFRAN-ODT) 4 MG disintegrating tablet Take 1 tablet (4 mg total) by mouth every 8 (eight) hours as needed for nausea or vomiting. (Patient not taking: Reported on 01/16/2023) 60 tablet 2   No current facility-administered medications  for this visit.   Facility-Administered Medications Ordered in Other Visits  Medication Dose Route Frequency Provider Last Rate Last Admin   0.9 %  sodium chloride infusion   Intravenous Continuous Herring, Vear Clock, NP   Stopped at 10/14/15 1312   methotrexate (50 mg/ml) 6.3 g in sodium chloride 0.9 % 1,000 mL injection   Intravenous Once Louretta Shorten R, MD       sodium chloride flush (NS) 0.9 % injection 10 mL  10 mL Intravenous PRN Earna Coder, MD   10 mL at 10/05/16 1400    PHYSICAL  EXAMINATION: ECOG PERFORMANCE STATUS: 0 - Asymptomatic  BP 115/71 (BP Location: Right Arm, Patient Position: Sitting, Cuff Size: Large)   Pulse 65   Temp 98.6 F (37 C) (Tympanic)   Wt 204 lb 1.6 oz (92.6 kg)   SpO2 100%   BMI 36.74 kg/m   Filed Weights   02/01/23 1115  Weight: 204 lb 1.6 oz (92.6 kg)   Physical Exam Constitutional:      Comments: Ambulating independently.  Accompanied by husband.    HENT:     Head: Normocephalic and atraumatic.     Mouth/Throat:     Pharynx: No oropharyngeal exudate.  Eyes:     Pupils: Pupils are equal, round, and reactive to light.  Cardiovascular:     Rate and Rhythm: Normal rate and regular rhythm.  Pulmonary:     Effort: Pulmonary effort is normal. No respiratory distress.     Breath sounds: Normal breath sounds. No wheezing.  Abdominal:     General: Bowel sounds are normal. There is no distension.     Palpations: Abdomen is soft. There is no mass.     Tenderness: There is no abdominal tenderness. There is no guarding or rebound.  Musculoskeletal:        General: No tenderness. Normal range of motion.     Cervical back: Normal range of motion and neck supple.  Skin:    General: Skin is warm.  Neurological:     Mental Status: She is alert and oriented to person, place, and time.  Psychiatric:        Mood and Affect: Affect normal.     LABORATORY DATA:  I have reviewed the data as listed    Component Value Date/Time   NA 136 02/01/2023 1037   NA 138 08/12/2019 0000   K 3.6 02/01/2023 1037   CL 104 02/01/2023 1037   CO2 25 02/01/2023 1037   GLUCOSE 109 (H) 02/01/2023 1037   BUN 17 02/01/2023 1037   BUN 8 08/12/2019 0000   CREATININE 1.19 (H) 02/01/2023 1037   CALCIUM 8.9 02/01/2023 1037   PROT 6.2 (L) 02/01/2023 1037   ALBUMIN 4.0 02/01/2023 1037   AST 13 (L) 02/01/2023 1037   ALT 12 02/01/2023 1037   ALKPHOS 68 02/01/2023 1037   BILITOT 0.6 02/01/2023 1037   GFRNONAA 50 (L) 02/01/2023 1037   GFRAA >60 11/24/2019  1442    No results found for: "SPEP", "UPEP"  Lab Results  Component Value Date   WBC 7.1 02/01/2023   NEUTROABS 5.3 01/16/2023   HGB 13.2 02/01/2023   HCT 39.6 02/01/2023   MCV 90.6 02/01/2023   PLT 169 02/01/2023      Chemistry      Component Value Date/Time   NA 136 02/01/2023 1037   NA 138 08/12/2019 0000   K 3.6 02/01/2023 1037   CL 104 02/01/2023 1037   CO2 25 02/01/2023  1037   BUN 17 02/01/2023 1037   BUN 8 08/12/2019 0000   CREATININE 1.19 (H) 02/01/2023 1037   GLU 88 08/12/2019 0000      Component Value Date/Time   CALCIUM 8.9 02/01/2023 1037   ALKPHOS 68 02/01/2023 1037   AST 13 (L) 02/01/2023 1037   ALT 12 02/01/2023 1037   BILITOT 0.6 02/01/2023 1037       RADIOGRAPHIC STUDIES: I have personally reviewed the radiological images as listed and agreed with the findings in the report. No results found.   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 S/p WBRT; MRI brain- March 2024- No evidence for residual or recurrent lymphoma; Stable atrophy and diffuse white matter disease consistent with prior whole brain radiation;  No acute intracranial abnormality or significant interval change.  # No acute worsening of mental status changes [but a gradual decline noted-see below].  Hold off any brain imaging patient previously declined MRI brain.  # Delirium- from UTI- s/p antibiotics- would check  UA/culture today.   # Neurocognitive deficits/motor deficits gait instability-secondary to sequela of chemotherapy/radiation- [s/p evaluation with Dr. Marlon Pel progressive/worsening.  I had a long discussion again with the patient and family [husband and daughter]-regarding the progressive nature of worsening neuro cognitive deficits.  Interested in the evaluation with neurology Dr. Barbaraann Cao.  I reached out to social worker regarding  follow-up-given family's ongoing concerns with adjustment to the patient's overall general decline.  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  Awaiting B12 today.  # Gait instability/orthostatic hypotension-s/p physical therapy; continue midodrine-stable.  # Chronic bilateral lower extremity cramping left more than right.  Electrolytes within normal limits-discussed regarding ultrasound of the left lower extremity [given the tenderness/mild swelling-question chronic].  Declined ultrasound.  # Mediport-  Flushing; but not drawing blood.  Declines dye study; discussed regarding explantation.  Continue port flush for now. Pt declines evaluation with Dr.Dew Fonnie Birkenhead: anaesthesia/ neurocognitive defects].  They will call us if they change her mind.  # DISPOSITION: # make appt with Dr.Vaslow  # port flush in 3 months -  #Follow up in 6  months MD; labs- cbc/cmp/ldh; mag; B12 levels; port flush [double lumen]-Dr.B  # 25 minutes face-to-face with the patient discussing the above plan of care; more than 50% of time spent on prognosis/ natural history; counseling and coordination.     Orders Placed This Encounter  Procedures   Urine Culture   Urinalysis, Complete w Microscopic    Standing Status:   Future    Number of Occurrences:   1    Standing Expiration Date:   02/01/2024   CBC (Cancer Center Only)    Standing Status:   Future    Standing Expiration Date:   02/01/2024   CMP (Cancer Center only)    Standing Status:   Future    Standing Expiration Date:   02/01/2024   Lactate dehydrogenase    Standing Status:   Future    Standing Expiration Date:   02/01/2024   Magnesium    Standing Status:   Future    Standing Expiration Date:   02/01/2024   Vitamin B12    Standing Status:   Future    Standing Expiration Date:   02/01/2024    All questions were answered. The patient knows to call the clinic with any problems, questions or concerns.  Earna Coder,  MD 02/01/2023 12:48 PM

## 2023-02-01 NOTE — Progress Notes (Signed)
[facility] CSW Progress Note  Clinical Child psychotherapist contacted caregiver by phone.  Stacy Murillo stated that the visit with the MD today was as expected.  He requested CSW contact information to speak further at another time.    Dorothey Baseman, LCSW Clinical Social Worker [facility]

## 2023-02-01 NOTE — Progress Notes (Signed)
Patient finished her UTI antibiotic/medication, but the family would like for Dr. B to recheck the patient to see if she still has a UTI due to still having some confusion. Patient has been feeling more off balance, fatigue, along  swollen ankle and shortness of breath for more than a few weeks.

## 2023-02-05 LAB — URINE CULTURE: Culture: >=100000 — AB

## 2023-02-06 ENCOUNTER — Telehealth: Payer: Self-pay | Admitting: Internal Medicine

## 2023-02-06 ENCOUNTER — Encounter: Payer: Self-pay | Admitting: Internal Medicine

## 2023-02-06 MED ORDER — CIPROFLOXACIN HCL 500 MG PO TABS: 500.0000 mg | ORAL_TABLET | Freq: Two times a day (BID) | ORAL | 0 refills | Status: DC

## 2023-02-06 NOTE — Telephone Encounter (Signed)
Spoke with husband regarding the results of the urine culture.  Called in prescription for ciprofloxacin twice daily.  For 5 days.  No allergies to Cipro.

## 2023-03-01 ENCOUNTER — Inpatient Hospital Stay: Payer: Medicare Other | Admitting: Internal Medicine

## 2023-04-15 ENCOUNTER — Other Ambulatory Visit: Payer: Self-pay | Admitting: Internal Medicine

## 2023-04-15 MED ORDER — BUSPIRONE HCL 10 MG PO TABS
10.0000 mg | ORAL_TABLET | Freq: Two times a day (BID) | ORAL | 2 refills | Status: DC
Start: 2023-04-15 — End: 2023-06-18

## 2023-04-24 ENCOUNTER — Other Ambulatory Visit: Payer: Self-pay | Admitting: Internal Medicine

## 2023-05-03 ENCOUNTER — Inpatient Hospital Stay: Payer: Medicare Other | Attending: Internal Medicine

## 2023-05-03 DIAGNOSIS — Z95828 Presence of other vascular implants and grafts: Secondary | ICD-10-CM

## 2023-05-03 DIAGNOSIS — Z452 Encounter for adjustment and management of vascular access device: Secondary | ICD-10-CM | POA: Diagnosis present

## 2023-05-03 DIAGNOSIS — C8583 Other specified types of non-Hodgkin lymphoma, intra-abdominal lymph nodes: Secondary | ICD-10-CM | POA: Insufficient documentation

## 2023-05-03 DIAGNOSIS — C833A Diffuse large b-cell lymphoma, in remission: Secondary | ICD-10-CM | POA: Diagnosis not present

## 2023-05-03 MED ORDER — HEPARIN SOD (PORK) LOCK FLUSH 100 UNIT/ML IV SOLN
500.0000 [IU] | Freq: Once | INTRAVENOUS | Status: AC
  Administered 2023-05-03: 500 [IU] via INTRAVENOUS
  Filled 2023-05-03: qty 5

## 2023-05-03 MED ORDER — SODIUM CHLORIDE 0.9% FLUSH
10.0000 mL | INTRAVENOUS | Status: DC | PRN
  Administered 2023-05-03: 10 mL via INTRAVENOUS
  Filled 2023-05-03: qty 10

## 2023-06-11 ENCOUNTER — Other Ambulatory Visit: Payer: Self-pay | Admitting: Internal Medicine

## 2023-07-02 ENCOUNTER — Telehealth: Payer: Self-pay

## 2023-07-02 ENCOUNTER — Ambulatory Visit: Payer: BLUE CROSS/BLUE SHIELD | Admitting: Internal Medicine

## 2023-07-02 MED ORDER — BUSPIRONE HCL 15 MG PO TABS: 15.0000 mg | ORAL_TABLET | Freq: Two times a day (BID) | ORAL | 2 refills | Status: DC

## 2023-07-02 NOTE — Telephone Encounter (Signed)
 Would you like for me to see if they can do a virtual visit tomorrow.

## 2023-07-02 NOTE — Telephone Encounter (Signed)
 Copied from CRM [id number]. Topic: Clinical - Medical Advice >> Jul 02, 2023 11:05 AM Fuller Mandril wrote: Reason for CRM: Patient husband called requested to cancel appt. Canceled appt. Appt was need for future refills for Buspar but caller states patient refuses to go to appt. States she has dementia and he can not get her to come to any appts. He scheduled this one thinking she would come but she couldn't and he is at a lost and doesn't know what to do. He was even trying to see if the dose of this medication should be increased. Would like some advise/suggestions if any. Thank You

## 2023-07-02 NOTE — Addendum Note (Signed)
 Addended by: Sherlene Shams on: 07/02/2023 12:30 PM   Modules accepted: Orders

## 2023-07-02 NOTE — Telephone Encounter (Signed)
 LMTCB. Need to let pt's husband know that Dr. Darrick Huntsman will send in the higher dose of Buspar to see if that helps pt.

## 2023-07-03 NOTE — Telephone Encounter (Signed)
 LMTCB

## 2023-07-08 ENCOUNTER — Ambulatory Visit (INDEPENDENT_AMBULATORY_CARE_PROVIDER_SITE_OTHER): Payer: Medicare Other | Admitting: *Deleted

## 2023-07-08 VITALS — Ht 62.0 in | Wt 200.0 lb

## 2023-07-08 DIAGNOSIS — Z0000 Encounter for general adult medical examination without abnormal findings: Secondary | ICD-10-CM | POA: Diagnosis not present

## 2023-07-08 NOTE — Patient Instructions (Signed)
 Ms. Stacy Murillo , Thank you for taking time to come for your Medicare Wellness Visit. I appreciate your ongoing commitment to your health goals. Please review the following plan we discussed and let me know if I can assist you in the future.   Referrals/Orders/Follow-Ups/Clinician Recommendations: Please make sure you keep you appointment with Dr. Darrick Huntsman.  This is a list of the screening recommended for you and due dates:  Health Maintenance  Topic Date Due   COVID-19 Vaccine (4 - 2024-25 season) 01/13/2023   Colon Cancer Screening  06/19/2024   Medicare Annual Wellness Visit  07/07/2024   DTaP/Tdap/Td vaccine (4 - Td or Tdap) 02/01/2029   Pneumonia Vaccine  Completed   Flu Shot  Completed   DEXA scan (bone density measurement)  Completed   Hepatitis C Screening  Completed   Zoster (Shingles) Vaccine  Completed   HPV Vaccine  Aged Out    Advanced directives: (Copy Requested) Please bring a copy of your health care power of attorney and living will to the office to be added to your chart at your convenience.  Next Medicare Annual Wellness Visit scheduled for next year: Yes 07/13/24 @ 1:40

## 2023-07-08 NOTE — Progress Notes (Signed)
 Subjective:   Stacy Murillo is a 68 y.o. who presents for a Medicare Wellness preventive visit.  Visit Complete: Virtual I connected with  Stacy Murillo on 07/08/23 by a audio enabled telemedicine application and verified that I am speaking with the correct person using two identifiers. Patient's husband helped with visit.  Patient Location: Home  Provider Location: Office/Clinic  I discussed the limitations of evaluation and management by telemedicine. The patient expressed understanding and agreed to proceed.  Vital Signs: Because this visit was a virtual/telehealth visit, some criteria may be missing or patient reported. Any vitals not documented were not able to be obtained and vitals that have been documented are patient reported.  VideoDeclined- This patient declined Librarian, academic. Therefore the visit was completed with audio only.  AWV Questionnaire: No: Patient Medicare AWV questionnaire was not completed prior to this visit.  Cardiac Risk Factors include: advanced age (>103men, >21 women);dyslipidemia;obesity (BMI >30kg/m2)     Objective:    Today's Vitals   07/08/23 1055  Weight: 200 lb (90.7 kg)  Height: 5\' 2"  (1.575 m)   Body mass index is 36.58 kg/m.     07/08/2023   11:08 AM 02/01/2023   11:07 AM 01/16/2023    2:28 PM 08/02/2022   10:51 AM 07/06/2022   11:11 AM 04/27/2022   11:47 AM 03/30/2022   10:55 AM  Advanced Directives  Does Patient Have a Medical Advance Directive? Yes Yes Yes Yes Yes Yes Yes  Type of Estate agent of [place];Living will Healthcare Power of [place];Living will Healthcare Power of [place];Living will Healthcare Power of [place];Living will Healthcare Power of [place];Living will Healthcare Power of [place];Living will   Does patient want to make changes to medical advance directive?   No - Patient declined  No - Patient declined No - Patient declined   Copy of Healthcare Power of  Attorney in Chart? No - copy requested No - copy requested No - copy requested  No - copy requested No - copy requested   Would patient like information on creating a medical advance directive?   No - Patient declined   No - Patient declined     Current Medications (verified) Outpatient Encounter Medications as of 07/08/2023  Medication Sig   acetaminophen (TYLENOL) 650 MG CR tablet Take 650 mg by mouth every 8 (eight) hours as needed for pain.   busPIRone (BUSPAR) 15 MG tablet Take 1 tablet (15 mg total) by mouth 2 (two) times daily.   cyanocobalamin (VITAMIN B12) 1000 MCG tablet Take 1 tablet (1,000 mcg total) by mouth daily.   midodrine (PROAMATINE) 5 MG tablet TAKE ONE AND ONE-HALF TABLETS THREE TIMES A DAY   ondansetron (ZOFRAN-ODT) 4 MG disintegrating tablet Take 1 tablet (4 mg total) by mouth every 8 (eight) hours as needed for nausea or vomiting. 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   Jugular vein occlusion, 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   Leg cramps    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   Night sweats    Osteoporosis    Overweight(278.02)    Obesity   Palpitations    Shortness of breath dyspnea    Tachycardia    Thrombocytopenia (HCC)    Past Surgical History:  Procedure Laterality Date   ABDOMINAL HYSTERECTOMY  2005   BONE MARROW BIOPSY  09/14/15   CHOLECYSTECTOMY  1997   COLONOSCOPY   07/05/2014   ESOPHAGOGASTRODUODENOSCOPY  07/05/2014   INGUINAL LYMPH NODE BIOPSY Right 10/05/2015   Procedure: INGUINAL LYMPH NODE BIOPSY;  Surgeon: Kieth Brightly, MD;  Location: [facility] ORS;  Service: General;  Laterality: Right;   PERIPHERAL VASCULAR CATHETERIZATION N/A 09/27/2015   Procedure: Shelda Pal Cath Insertion;  Surgeon: Annice Needy, MD;  Location: [facility] INVASIVE CV LAB;  Service: Cardiovascular;  Laterality: N/A;   PORTA CATH REMOVAL Right 06/03/2017   Procedure: PORTA CATH REMOVAL, with venous thrombectomy;  Surgeon: Annice Needy, MD;  Location: [facility] INVASIVE CV LAB;  Service: Cardiovascular;  Laterality: Right;   PORTACATH PLACEMENT Right    Family History  Problem Relation Age of Onset   Heart disease Father        CABG x 4   Multiple myeloma Father    Hypertension Mother    Pancreatic cancer Mother    Ulcers Mother    Asthma Mother    Brain cancer Maternal Uncle    Multiple myeloma Maternal Uncle    Diabetes Neg Hx    Breast cancer Neg Hx    Social History   Socioeconomic History   Marital status: Married    Spouse name: Not on file   Number of children: Not on file   Years of education: Not on file   Highest education level: Not on file  Occupational History   Not on file  Tobacco Use   Smoking status: Never   Smokeless tobacco: Never  Vaping Use   Vaping status: Never Used  Substance and Sexual Activity   Alcohol use: No    Alcohol/week: 0.0 standard drinks of alcohol   Drug use: No   Sexual activity: Yes    Birth control/protection: None  Other Topics Concern   Not on file  Social History Narrative   Married   Does not get regular exercise   Social Drivers of Health   Financial Resource Strain: Low Risk  (07/08/2023)   Overall Financial Resource Strain (CARDIA)    Difficulty of Paying Living Expenses: Not hard at all  Food Insecurity: No Food Insecurity (07/08/2023)   Hunger Vital Sign    Worried About Running Out of Food in the Last Year: Never  true    Ran Out of Food in the Last Year: Never true  Transportation Needs: No Transportation Needs (07/08/2023)   PRAPARE - Administrator, Civil Service (Medical): No    Lack of Transportation (Non-Medical): No  Physical Activity: Inactive (07/08/2023)   Exercise Vital Sign    Days of Exercise per Week: 0 days    Minutes of Exercise per Session: 0 min  Stress: No Stress Concern Present (07/08/2023)   [employer] of Occupational Health - Occupational Stress Questionnaire    Feeling of Stress : Only a little  Social Connections: Moderately Isolated (07/08/2023)   Social Connection and Isolation Panel [NHANES]    Frequency of Communication with Friends and Family:  More than three times a week    Frequency of Social Gatherings with Friends and Family: More than three times a week    Attends Religious Services: Never    Database administrator or Organizations: No    Attends Engineer, structural: Never    Marital Status: Married    Tobacco Counseling Counseling given: Not Answered    Clinical Intake:  Pre-visit preparation completed: Yes  Pain : No/denies pain     BMI - recorded: 36.58 Nutritional Status: BMI > 30  Obese Nutritional Risks: None Diabetes: No  How often do you need to have someone help you when you read instructions, pamphlets, or other written materials from your doctor or pharmacy?: 1 - Never  Interpreter Needed?: No  Information entered by :: R. Candice Tobey LPN   Activities of Daily Living     07/08/2023   10:56 AM  In your present state of health, do you have any difficulty performing the following activities:  Hearing? 0  Vision? 0  Difficulty concentrating or making decisions? 1  Walking or climbing stairs? 1  Dressing or bathing? 1  Doing errands, shopping? 1  Preparing Food and eating ? N  Using the Toilet? N  In the past six months, have you accidently leaked urine? Y  Do you have problems with loss of bowel control? N   Managing your Medications? Y  Managing your Finances? Y  Housekeeping or managing your Housekeeping? Y    Patient Care Team: Sherlene Shams, MD as PCP - General (Internal Medicine) Earna Coder, MD as Consulting Physician (Internal Medicine) Kieth Brightly, MD (General Surgery) Almond Lint, MD as Consulting Physician (Cardiology)  Indicate any recent Medical Services you may have received from other than [facility] providers in the past year (date may be approximate).     Assessment:   This is a routine wellness examination for Stacy Murillo.  Hearing/Vision screen Hearing Screening - Comments:: No issues Vision Screening - Comments:: glasses   Goals Addressed             This Visit's Progress    Patient Stated       Wants to walk more.       Depression Screen     07/08/2023   11:02 AM 07/06/2022   11:11 AM 05/16/2022    2:53 PM 06/29/2021    9:13 AM 04/20/2021   10:05 AM 06/28/2020   11:42 AM 04/19/2020    3:20 PM  PHQ 2/9 Scores  PHQ - 2 Score 0 0 0 0 1 0 1  PHQ- 9 Score 0      6    Fall Risk     07/08/2023   10:59 AM 07/06/2022   11:16 AM 05/16/2022    2:53 PM 03/13/2022    4:24 PM 06/29/2021    9:17 AM  Fall Risk   Falls in the past year? 1 1 1  0 0  Number falls in past yr: 0  1    Injury with Fall? 0  0    Risk for fall due to : History of fall(s);Impaired balance/gait  History of fall(s) Mental status change;History of fall(s)   Follow up Falls evaluation completed;Falls prevention discussed Falls evaluation completed;Falls prevention discussed Falls evaluation completed Falls evaluation completed Falls evaluation completed    MEDICARE RISK AT HOME:  Medicare Risk at Home Any stairs in or around the home?: Yes If so, are there any without handrails?: No Home free of loose  throw rugs in walkways, pet beds, electrical cords, etc?: Yes Adequate lighting in your home to reduce risk of falls?: Yes Life alert?: No Use of a cane, walker or w/c?:  No Grab bars in the bathroom?: Yes Shower chair or bench in shower?: Yes Elevated toilet seat or a handicapped toilet?: Yes  TIMED UP AND GO:  Was the test performed?  No  Cognitive Function: 6CIT completed        07/08/2023   11:09 AM 07/06/2022   11:19 AM  6CIT Screen  What Year? 4 points 4 points  What month? 3 points 0 points  What time? 0 points 0 points  Count back from 20 0 points 0 points  Months in reverse 4 points 4 points  Repeat phrase 10 points 10 points  Total Score 21 points 18 points    Immunizations Immunization History  Administered Date(s) Administered   DTaP / Hep B / IPV 01/15/2018, 02/02/2019   HIB (PRP-T) 01/15/2018, 02/02/2019   Influenza Split 02/12/2011, 02/17/2012, 03/08/2014   Influenza Whole 05/14/2009   Influenza, High Dose Seasonal PF 02/17/2020, 02/02/2021, 02/19/2023   Influenza,inj,Quad PF,6+ Mos 01/28/2018, 02/02/2019, 05/16/2022   Influenza-Unspecified 03/13/2013, 01/22/2017, 01/28/2018   Janssen (J&J) SARS-COV-2 Vaccination 08/19/2019   MMR 12/22/2019   Moderna Sars-Covid-2 Vaccination 02/21/2020, 04/10/2021   Pneumococcal Conjugate-13 01/15/2018, 02/02/2019   Pneumococcal Polysaccharide-23 12/22/2019   Tdap 04/06/2014   Zoster Recombinant(Shingrix) 01/15/2018, 07/16/2018   Zoster, Live 07/18/2011    Screening Tests Health Maintenance  Topic Date Due   COVID-19 Vaccine (4 - 2024-25 season) 01/13/2023   Colonoscopy  06/19/2024   Medicare Annual Wellness (AWV)  07/07/2024   DTaP/Tdap/Td (4 - Td or Tdap) 02/01/2029   Pneumonia Vaccine 8+ Years old  Completed   INFLUENZA VACCINE  Completed   DEXA SCAN  Completed   Hepatitis C Screening  Completed   Zoster Vaccines- Shingrix  Completed   HPV VACCINES  Aged Out    Health Maintenance  Health Maintenance Due  Topic Date Due   COVID-19 Vaccine (4 - 2024-25 season) 01/13/2023   Health Maintenance Items Addressed: Patient declines covid vaccine. Per patient's husband she no  longer gets mammograms. Patient declines Bone Density.    Additional Screening:  Vision Screening: Recommended annual ophthalmology exams for early detection of glaucoma and other disorders of the eye. Patient is up to date with eye exam [facility]  Dental Screening: Recommended annual dental exams for proper oral hygiene  Community Resource Referral / Chronic Care Management: CRR required this visit?  No   CCM required this visit?  No     Plan:     I have personally reviewed and noted the following in the patient's chart:   Medical and social history Use of alcohol, tobacco or illicit drugs  Current medications and supplements including opioid prescriptions. Patient is not currently taking opioid prescriptions. Functional ability and status Nutritional status Physical activity Advanced directives List of other physicians Hospitalizations, surgeries, and ER visits in previous 12 months Vitals Screenings to include cognitive, depression, and falls Referrals and appointments  In addition, I have reviewed and discussed with patient certain preventive protocols, quality metrics, and best practice recommendations. A written personalized care plan for preventive services as well as general preventive health recommendations were provided to patient.     Sydell Axon, LPN   6/38/4536   After Visit Summary: (MyChart) Due to this being a telephonic visit, the after visit summary with patients personalized plan was offered to patient  via MyChart   Notes: Please refer to Routing Comments.

## 2023-07-20 ENCOUNTER — Other Ambulatory Visit: Payer: Self-pay | Admitting: Internal Medicine

## 2023-08-09 ENCOUNTER — Inpatient Hospital Stay: Payer: BLUE CROSS/BLUE SHIELD | Attending: Internal Medicine

## 2023-08-09 ENCOUNTER — Inpatient Hospital Stay (HOSPITAL_BASED_OUTPATIENT_CLINIC_OR_DEPARTMENT_OTHER): Payer: BLUE CROSS/BLUE SHIELD | Admitting: Internal Medicine

## 2023-08-09 DIAGNOSIS — C8589 Other specified types of non-Hodgkin lymphoma, extranodal and solid organ sites: Secondary | ICD-10-CM

## 2023-08-09 DIAGNOSIS — I951 Orthostatic hypotension: Secondary | ICD-10-CM | POA: Diagnosis not present

## 2023-08-09 DIAGNOSIS — R300 Dysuria: Secondary | ICD-10-CM

## 2023-08-09 DIAGNOSIS — Z8572 Personal history of non-Hodgkin lymphomas: Secondary | ICD-10-CM | POA: Diagnosis present

## 2023-08-09 DIAGNOSIS — R2689 Other abnormalities of gait and mobility: Secondary | ICD-10-CM | POA: Insufficient documentation

## 2023-08-09 DIAGNOSIS — E860 Dehydration: Secondary | ICD-10-CM

## 2023-08-09 LAB — CMP (CANCER CENTER ONLY)
ALT: 25 U/L (ref 0–44)
AST: 22 U/L (ref 15–41)
Albumin: 4.3 g/dL (ref 3.5–5.0)
Alkaline Phosphatase: 58 U/L (ref 38–126)
Anion gap: 11 (ref 5–15)
BUN: 14 mg/dL (ref 8–23)
CO2: 26 mmol/L (ref 22–32)
Calcium: 9.2 mg/dL (ref 8.9–10.3)
Chloride: 102 mmol/L (ref 98–111)
Creatinine: 1.41 mg/dL — ABNORMAL HIGH (ref 0.44–1.00)
GFR, Estimated: 40 mL/min — ABNORMAL LOW (ref >60)
Glucose, Bld: 134 mg/dL — ABNORMAL HIGH (ref 70–99)
Potassium: 3.7 mmol/L (ref 3.5–5.1)
Sodium: 139 mmol/L (ref 135–145)
Total Bilirubin: 0.7 mg/dL (ref 0.0–1.2)
Total Protein: 6.8 g/dL (ref 6.5–8.1)

## 2023-08-09 LAB — CBC (CANCER CENTER ONLY)
HCT: 41.3 % (ref 36.0–46.0)
Hemoglobin: 14 g/dL (ref 12.0–15.0)
MCH: 30.2 pg (ref 26.0–34.0)
MCHC: 33.9 g/dL (ref 30.0–36.0)
MCV: 89.2 fL (ref 80.0–100.0)
Platelet Count: 174 10*3/uL (ref 150–400)
RBC: 4.63 MIL/uL (ref 3.87–5.11)
RDW: 12.9 % (ref 11.5–15.5)
WBC Count: 7.8 10*3/uL (ref 4.0–10.5)
nRBC: 0 % (ref 0.0–0.2)

## 2023-08-09 LAB — MAGNESIUM: Magnesium: 2.1 mg/dL (ref 1.7–2.4)

## 2023-08-09 LAB — VITAMIN B12: Vitamin B-12: 5680 pg/mL — ABNORMAL HIGH (ref 180–914)

## 2023-08-09 LAB — LACTATE DEHYDROGENASE: LDH: 147 U/L (ref 98–192)

## 2023-08-09 NOTE — Progress Notes (Signed)
 Spouse states he thinks pt may have some dementia. Looses balance, fell a few week back, no injuries.  Pt is concerned with bladder control.  C/o b/l knee pain, 5/10, ankles swelling.

## 2023-08-09 NOTE — Addendum Note (Signed)
 Addended by: Darrold Span A on: 08/09/2023 04:19 PM   Modules accepted: Orders

## 2023-08-09 NOTE — Assessment & Plan Note (Addendum)
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 S/p WBRT; MRI brain- March 2024- No evidence for residual or recurrent lymphoma; Stable atrophy and diffuse white matter disease consistent with prior whole brain radiation;  No acute intracranial abnormality or significant interval change.  # No acute worsening of mental status changes [but a gradual decline noted-see below].  Hold off any brain imaging patient previously declined MRI brain. [place].   # Neurocognitive deficits/motor deficits gait instability-secondary to sequela of chemotherapy/radiation- [s/p evaluation with Dr. Marlon Pel progressive/worsening.  Stable.   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   # Gait instability/orthostatic hypotension-s/p physical therapy; continue midodrine-stable.  # Mediport-  Flushing; but not drawing blood.  Declines dye study; discussed regarding explantation.  Continue port flush for now. Pt agreement with evaluation with Dr.Dew Fonnie Birkenhead: anaesthesia/ neurocognitive defects].  Missed previous appointment with Dr. Wyn Quaker.  Recommend calling the office of for explantation.  # IF the port is explanted- Since patient is clinically stable I think is reasonable for the patient to follow-up with PCP/can follow-up with [place] as needed.  Patient comfortable with the plan.  The husband will call if to cancel appointments if the port expanded.  # DISPOSITION: # port flush in 3 months -  #Follow up in 6  months MD; labs- cbc/cmp/ldh; mag; B12 levels; port flush [double lumen]-Dr.B

## 2023-08-09 NOTE — Progress Notes (Signed)
 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 CT- 1-2 CM LN subpectoral/medistinal/ retro-peritoneal/pelvic/ Right inguinal LN 1.6cm- Bx- DLBCL- ABC; myc-POS; FISH gene re-arragement-NEG. PET- MULTIPLE LN/ Bone involvement; R-CHOP x6- CR'# OCT 2017- PET scan- CR; DEC 22nd 2017- BMBx- "atypical large B cells- <1%  [[facility]; II opinion- Dr.Grover- review of path- not concerning for residual lymphoma]; recommend surviellance  # March 26th  2018PET- NED  # Lumbar puncture- difficult-spinal headache. S/p high dose MXT x4.  ------------------------------------------------------------------- # NOV 2018-recurrent diffuse large B-cell lymphoma [right axilla lymph node biopsy proven]; PET- multiple bone lesions; axillary adenopathy; splenomegaly uptake  # Nov 16th 2018- R-; s/p 3 cycles-CR' Auto-Stem cell Transplant date: 07/11/17 [facility]; Dr.Vincent]; May 1st 2019- PET CR.   # OCT 2020- MASS  spanning the corpus callosum. Brain Biopsy  at [facility]-DLBCL. CD20+. EBV -, Ki67+, Pax5+. Port placed 10/27, R-MPV started 10/27 [facility]; Dr.Grover]. s/p WBRT ; MARCH 31st- MRI- remission/Stable disease   ---------------------------------------------------------- # June 2017-Elevated LFTs- valacylovir/diflucan; resolved.   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   # May 2017- EF- 55-65%  #August 2019-double vision; secondary to refractive error-prism improved. 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 Alone/Accompanied by husband  Gearl B [place] 68 y.o.  female pleasant patient above history of relapsed diffuse large B-cell lymphoma in the brain-currently s/p R-methotrexate procarbazine/steroid; also status post consolidation radiation- currently on surveillance is here for follow-up.   Patient spouse states he thinks pt may progressive dementia. Looses balance, fell a few week back, no injuries.   C/o b/l knee pain, 5/10, ankles swelling. Patient denies any recent hospitalizations.  Patient has weaned herself off gabapentin/has minimal postherpetic neuralgia.  Otherwise no headaches no nausea no vomiting.   Review of Systems  Constitutional:  Positive for malaise/fatigue. Negative for chills, diaphoresis, fever and weight loss.  HENT:  Negative for nosebleeds and sore throat.   Eyes:  Negative for double vision.  Respiratory:  Negative for cough, hemoptysis, sputum production and wheezing.   Cardiovascular:  Negative for chest pain, orthopnea and leg swelling.  Gastrointestinal:  Negative for abdominal pain, blood in stool, constipation, diarrhea, heartburn, melena, nausea and vomiting.  Genitourinary:  Negative for dysuria, frequency and urgency.   Musculoskeletal:  Negative for back pain and joint pain.  Skin: Negative.  Negative for itching and rash.  Neurological:  Positive for dizziness. Negative for tingling,  focal weakness and weakness.  Endo/Heme/Allergies:  Does not bruise/bleed easily.  Psychiatric/Behavioral:  Positive for memory loss. Negative for depression. The patient does not have insomnia.       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   Leg cramps    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   Night sweats    Osteoporosis    Overweight(278.02)    Obesity   Palpitations    Shortness of breath dyspnea    Tachycardia    Thrombocytopenia (HCC)     PAST SURGICAL HISTORY :   Past Surgical History:  Procedure Laterality Date   ABDOMINAL HYSTERECTOMY  2005   BONE MARROW BIOPSY  09/14/15   CHOLECYSTECTOMY  1997   COLONOSCOPY  07/05/2014   ESOPHAGOGASTRODUODENOSCOPY  07/05/2014   INGUINAL LYMPH NODE BIOPSY Right 10/05/2015   Procedure: INGUINAL LYMPH NODE BIOPSY;  Surgeon: Kieth Brightly, MD;  Location: [facility] ORS;  Service: General;  Laterality: Right;   PERIPHERAL VASCULAR CATHETERIZATION N/A 09/27/2015   Procedure: Shelda Pal Cath Insertion;  Surgeon: Annice Needy, MD;  Location: [facility] INVASIVE CV LAB;  Service: Cardiovascular;  Laterality: N/A;   PORTA CATH REMOVAL Right 06/03/2017   Procedure: PORTA CATH REMOVAL, with venous thrombectomy;  Surgeon: Annice Needy, MD;  Location: [facility] INVASIVE CV LAB;  Service: Cardiovascular;  Laterality: Right;   PORTACATH PLACEMENT Right     FAMILY HISTORY :   Family History  Problem Relation Age of Onset   Heart disease Father        CABG x 4   Multiple myeloma Father     Hypertension Mother    Pancreatic cancer Mother    Ulcers Mother    Asthma Mother    Brain cancer Maternal Uncle    Multiple myeloma Maternal Uncle    Diabetes Neg Hx    Breast cancer Neg Hx     SOCIAL HISTORY:   Social History   Tobacco Use   Smoking status: Never   Smokeless tobacco: Never  Vaping Use   Vaping status: Never Used  Substance Use Topics   Alcohol use: No    Alcohol/week: 0.0 standard drinks of alcohol   Drug use: No    ALLERGIES:  is allergic to ondansetron hcl, oxycodone, and rituximab.  MEDICATIONS:  Current Outpatient Medications  Medication Sig Dispense Refill   acetaminophen (TYLENOL) 650 MG CR tablet Take 650 mg by mouth every 8 (eight) hours as needed for pain.     busPIRone (BUSPAR) 15 MG tablet Take 1 tablet (15 mg total) by mouth 2 (two) times daily. 60 tablet 2   cyanocobalamin (VITAMIN B12) 1000 MCG tablet Take 1 tablet (1,000 mcg total) by mouth daily. 30 tablet 2   midodrine (PROAMATINE) 5 MG tablet TAKE ONE AND ONE-HALF TABLETS THREE TIMES A DAY 180 tablet 1   No current facility-administered medications for this visit.   Facility-Administered Medications Ordered in Other Visits  Medication Dose Route Frequency Provider Last Rate Last Admin   0.9 %  sodium chloride infusion   Intravenous Continuous Herring, Vear Clock, NP   Stopped at 10/14/15 1312   methotrexate (50 mg/ml) 6.3 g in  sodium chloride 0.9 % 1,000 mL injection   Intravenous Once Louretta Shorten R, MD       sodium chloride flush (NS) 0.9 % injection 10 mL  10 mL Intravenous PRN Earna Coder, MD   10 mL at 10/05/16 1400    PHYSICAL EXAMINATION: ECOG PERFORMANCE STATUS: 0 - Asymptomatic  BP (!) 146/94 (BP Location: Left Arm, Patient Position: Sitting, Cuff Size: Normal)   Pulse 75   Temp 97.7 F (36.5 C) (Tympanic)   Resp 18   Ht 5\' 2"  (1.575 m)   Wt 205 lb 6.4 oz (93.2 kg)   SpO2 100%   BMI 37.57 kg/m   Filed Weights   08/09/23 1521  Weight: 205 lb 6.4 oz  (93.2 kg)    Physical Exam Constitutional:      Comments: Ambulating independently.  Accompanied by husband.    HENT:     Head: Normocephalic and atraumatic.     Mouth/Throat:     Pharynx: No oropharyngeal exudate.  Eyes:     Pupils: Pupils are equal, round, and reactive to light.  Cardiovascular:     Rate and Rhythm: Normal rate and regular rhythm.  Pulmonary:     Effort: Pulmonary effort is normal. No respiratory distress.     Breath sounds: Normal breath sounds. No wheezing.  Abdominal:     General: Bowel sounds are normal. There is no distension.     Palpations: Abdomen is soft. There is no mass.     Tenderness: There is no abdominal tenderness. There is no guarding or rebound.  Musculoskeletal:        General: No tenderness. Normal range of motion.     Cervical back: Normal range of motion and neck supple.  Skin:    General: Skin is warm.  Neurological:     Mental Status: She is alert and oriented to person, place, and time.  Psychiatric:        Mood and Affect: Affect normal.     LABORATORY DATA:  I have reviewed the data as listed    Component Value Date/Time   NA 136 02/01/2023 1037   NA 138 08/12/2019 0000   K 3.6 02/01/2023 1037   CL 104 02/01/2023 1037   CO2 25 02/01/2023 1037   GLUCOSE 109 (H) 02/01/2023 1037   BUN 17 02/01/2023 1037   BUN 8 08/12/2019 0000   CREATININE 1.19 (H) 02/01/2023 1037   CALCIUM 8.9 02/01/2023 1037   PROT 6.2 (L) 02/01/2023 1037   ALBUMIN 4.0 02/01/2023 1037   AST 13 (L) 02/01/2023 1037   ALT 12 02/01/2023 1037   ALKPHOS 68 02/01/2023 1037   BILITOT 0.6 02/01/2023 1037   GFRNONAA 50 (L) 02/01/2023 1037   GFRAA >60 11/24/2019 1442    No results found for: "SPEP", "UPEP"  Lab Results  Component Value Date   WBC 7.8 08/09/2023   NEUTROABS 5.3 01/16/2023   HGB 14.0 08/09/2023   HCT 41.3 08/09/2023   MCV 89.2 08/09/2023   PLT 174 08/09/2023      Chemistry      Component Value Date/Time   NA 136 02/01/2023 1037    NA 138 08/12/2019 0000   K 3.6 02/01/2023 1037   CL 104 02/01/2023 1037   CO2 25 02/01/2023 1037   BUN 17 02/01/2023 1037   BUN 8 08/12/2019 0000   CREATININE 1.19 (H) 02/01/2023 1037   GLU 88 08/12/2019 0000      Component Value Date/Time   CALCIUM 8.9  02/01/2023 1037   ALKPHOS 68 02/01/2023 1037   AST 13 (L) 02/01/2023 1037   ALT 12 02/01/2023 1037   BILITOT 0.6 02/01/2023 1037       RADIOGRAPHIC STUDIES: I have personally reviewed the radiological images as listed and agreed with the findings in the report. No results found.   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 S/p WBRT; MRI brain- March 2024- No evidence for residual or recurrent lymphoma; Stable atrophy and diffuse white matter disease consistent with prior whole brain radiation;  No acute intracranial abnormality or significant interval change.  # No acute worsening of mental status changes [but a gradual decline noted-see below].  Hold off any brain imaging patient previously declined MRI brain. [place].   # Neurocognitive deficits/motor deficits gait instability-secondary to sequela of chemotherapy/radiation- [s/p evaluation with Dr. Marlon Pel progressive/worsening.  Stable.   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   # Gait instability/orthostatic hypotension-s/p physical therapy; continue midodrine-stable.  # Mediport-  Flushing; but not drawing blood.  Declines dye study; discussed regarding explantation.  Continue port flush for now. Pt agreement with evaluation with Dr.Dew Fonnie Birkenhead: anaesthesia/ neurocognitive defects].  Missed previous appointment with Dr. Wyn Quaker.  Recommend calling the office of for explantation.  # IF the port is explanted- Since patient is clinically stable I think is reasonable for the patient to follow-up with PCP/can  follow-up with [place] as needed.  Patient comfortable with the plan.  The husband will call if to cancel appointments if the port expanded.  # DISPOSITION: # port flush in 3 months -  #Follow up in 6  months MD; labs- cbc/cmp/ldh; mag; B12 levels; port flush [double lumen]-Dr.B   No orders of the defined types were placed in this encounter.   All questions were answered. The patient knows to call the clinic with any problems, questions or concerns.      Earna Coder, MD 08/09/2023 4:07 PM

## 2023-08-09 NOTE — Progress Notes (Signed)
 Patient had a MRI on 07/30/2023.

## 2023-08-12 ENCOUNTER — Encounter: Payer: Self-pay | Admitting: Internal Medicine

## 2023-08-13 ENCOUNTER — Ambulatory Visit (INDEPENDENT_AMBULATORY_CARE_PROVIDER_SITE_OTHER): Payer: BLUE CROSS/BLUE SHIELD | Admitting: Internal Medicine

## 2023-08-13 ENCOUNTER — Encounter: Payer: Self-pay | Admitting: Internal Medicine

## 2023-08-13 VITALS — BP 132/76 | HR 94 | Ht 62.0 in | Wt 205.6 lb

## 2023-08-13 DIAGNOSIS — N3941 Urge incontinence: Secondary | ICD-10-CM

## 2023-08-13 DIAGNOSIS — I9589 Other hypotension: Secondary | ICD-10-CM

## 2023-08-13 DIAGNOSIS — C7931 Secondary malignant neoplasm of brain: Secondary | ICD-10-CM | POA: Diagnosis not present

## 2023-08-13 DIAGNOSIS — L219 Seborrheic dermatitis, unspecified: Secondary | ICD-10-CM | POA: Diagnosis not present

## 2023-08-13 DIAGNOSIS — W19XXXA Unspecified fall, initial encounter: Secondary | ICD-10-CM

## 2023-08-13 DIAGNOSIS — F0280 Dementia in other diseases classified elsewhere without behavioral disturbance: Secondary | ICD-10-CM

## 2023-08-13 DIAGNOSIS — Y92009 Unspecified place in unspecified non-institutional (private) residence as the place of occurrence of the external cause: Secondary | ICD-10-CM

## 2023-08-13 MED ORDER — TRIAMCINOLONE ACETONIDE 0.1 % EX CREA: 1.0000 | TOPICAL_CREAM | Freq: Every day | CUTANEOUS | 2 refills | Status: DC

## 2023-08-13 MED ORDER — BUSPIRONE HCL 15 MG PO TABS: 15.0000 mg | ORAL_TABLET | Freq: Three times a day (TID) | ORAL | 2 refills | Status: DC

## 2023-08-13 NOTE — Progress Notes (Signed)
 Subjective:  Patient ID: Stacy Murillo, female    DOB: Dec 06, 1955  Age: 68 y.o. MRN: [id number]  CC: The primary encounter diagnosis was Urge incontinence. 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   HPI Stacy Murillo presents for  Chief Complaint  Patient presents with   Medical Management of Chronic Issues    Last seen Jan 2024.  Stacy Murillo is a 68 yr old female with diffuse large B cell lymphoma and secondary lymphoma of the brain, progressive  cognitive impairment with memory loss who presents today for non oncologic issues She has been resistant to scheduling appts and is accompanied by her husband  Reviewed last visit with oncology March 2025:  'Patient has noted progression of cognitive decline.  With recurrent falls,  refusal to exercise,  urge incontinence.  She C/o b/l knee pain with prolonged rest and position  change, and bilateral lower extremity edema.  "  She has developed increased anxiety toward the endo of the day which husband describes as anxiety and an increase in buspirone is requested    Neck itching and flakin g.  Red blotches on scalp, flaking   She has  been compulsively picking at bumps on her legs     Outpatient Medications Prior to Visit  Medication Sig Dispense Refill   acetaminophen (TYLENOL) 650 MG CR tablet Take 650 mg by mouth every 8 (eight) hours as needed for pain.     midodrine (PROAMATINE) 5 MG tablet TAKE ONE AND ONE-HALF TABLETS THREE TIMES A DAY 180 tablet 1   busPIRone (BUSPAR) 15 MG tablet Take 1 tablet (15 mg total) by mouth 2 (two) times daily. 60 tablet 2   cyanocobalamin (VITAMIN B12) 1000 MCG tablet Take 1 tablet (1,000 mcg total) by mouth daily. 30 tablet 2   Facility-Administered Medications Prior to Visit  Medication Dose Route Frequency Provider Last Rate Last Admin   0.9 %  sodium  chloride infusion   Intravenous Continuous Herring, Vear Clock, NP   Stopped at 10/14/15 1312   methotrexate (50 mg/ml) 6.3 g in sodium chloride 0.9 % 1,000 mL injection   Intravenous Once Louretta Shorten R, MD       sodium chloride flush (NS) 0.9 % injection 10 mL  10 mL Intravenous PRN Earna Coder, MD   10 mL at 10/05/16 1400    Review of Systems;  Patient denies headache, fevers, malaise, unintentional weight loss, eye pain, sinus congestion and sinus pain, sore throat, dysphagia,  hemoptysis , cough, dyspnea, wheezing, chest pain, palpitations, orthopnea,  abdominal pain, nausea, melena, diarrhea, constipation, flank pain, dysuria, hematuria, , nocturia, numbness, tingling, seizures,  Focal weakness, Loss of consciousness,  Tremor, insomnia, depression,nd suicidal ideation.      Objective:  BP 132/76   Pulse 94   Ht 5\' 2"  (1.575 m)   Wt 205 lb 9.6 oz (93.3 kg)   SpO2 99%   BMI 37.60 kg/m   BP Readings from Last 3 Encounters:  08/13/23 132/76  08/09/23 (!) 146/94  02/01/23 115/71    Wt Readings from Last 3 Encounters:  08/13/23 205 lb 9.6 oz (93.3 kg)  08/09/23 205 lb 6.4 oz (93.2 kg)  07/08/23 200 lb (90.7 kg)    Physical Exam Vitals reviewed.  Constitutional:      General: She is not in acute distress.    Appearance: Normal  appearance. She is obese. She is not ill-appearing, toxic-appearing or diaphoretic.  HENT:     Head: Normocephalic.      Comments: scaling erythematous rash at nape of neck  Eyes:     General: No scleral icterus.       Right eye: No discharge.        Left eye: No discharge.     Conjunctiva/sclera: Conjunctivae normal.  Cardiovascular:     Rate and Rhythm: Normal rate and regular rhythm.     Heart sounds: Normal heart sounds.  Pulmonary:     Effort: Pulmonary effort is normal. No respiratory distress.     Breath sounds: Normal breath sounds.  Musculoskeletal:        General: Normal range of motion.  Skin:    General: Skin is  warm and dry.  Neurological:     General: No focal deficit present.     Mental Status: She is alert and oriented to person, place, and time. Mental status is at baseline.  Psychiatric:        Mood and Affect: Mood normal.        Behavior: Behavior normal.        Thought Content: Thought content normal.        Judgment: Judgment normal.     Lab Results  Component Value Date   HGBA1C 5.4 04/20/2021   HGBA1C 5.4 12/22/2018   HGBA1C 5.1 12/17/2017    Lab Results  Component Value Date   CREATININE 1.41 (H) 08/09/2023   CREATININE 1.19 (H) 02/01/2023   CREATININE 1.15 (H) 01/16/2023    Lab Results  Component Value Date   WBC 7.8 08/09/2023   HGB 14.0 08/09/2023   HCT 41.3 08/09/2023   PLT 174 08/09/2023   GLUCOSE 134 (H) 08/09/2023   CHOL 239 (H) 04/20/2021   TRIG 292.0 (H) 04/20/2021   HDL 67.80 04/20/2021   LDLDIRECT 139.0 04/20/2021   LDLCALC 122 (H) 10/04/2016   ALT 25 08/09/2023   AST 22 08/09/2023   NA 139 08/09/2023   K 3.7 08/09/2023   CL 102 08/09/2023   CREATININE 1.41 (H) 08/09/2023   BUN 14 08/09/2023   CO2 26 08/09/2023   TSH 5.027 (H) 02/01/2023   INR 0.89 06/02/2017   HGBA1C 5.4 04/20/2021   MICROALBUR 0.4 08/11/2013    MR Brain W Wo Contrast Result Date: 08/02/2022 CLINICAL DATA:  History of hematologic malignancy, staging. History of CNS lymphoma status post radiation and chemotherapy. EXAM: MRI HEAD WITHOUT AND WITH CONTRAST TECHNIQUE: Multiplanar, multiecho pulse sequences of the brain and surrounding structures were obtained without and with intravenous contrast. CONTRAST:  8mL GADAVIST GADOBUTROL 1 MMOL/ML IV SOLN COMPARISON:  Brain MRI 02/25/2022 FINDINGS: Brain: There is no acute intracranial hemorrhage, extra-axial fluid collection, or acute infarct. Background parenchymal volume loss with prominence of the ventricular system and extra-axial CSF spaces is stable. The ventricles are stable in size. Chronic blood products along a biopsy track in  the left frontal lobe are unchanged. Confluent FLAIR signal abnormality throughout the supratentorial white matter is unchanged, likely reflecting sequela of prior radiation. There is no new, worsening, or suspicious parenchymal signal abnormality. There is no abnormal enhancement there is no mass lesion. There is no mass effect or midline shift. Vascular: Normal flow voids. Skull and upper cervical spine: Normal marrow signal. Sinuses/Orbits: The paranasal sinuses are clear. The globes and orbits are unremarkable. Other: None. IMPRESSION: Stable brain MRI without evidence of residual or recurrent lymphoma. Electronically Signed  By: Lesia Hausen M.D.   On: 08/02/2022 10:10    Assessment & Plan:  .Urge incontinence -     Urinalysis, Routine w reflex microscopic; Future -     Urine Culture; Future  Seborrheic eczema of scalp Assessment & Plan: Prescribing triamcinolone for once daily use.    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  She has very little short term memory,  incontinent of urine , has new onset loss of balance with history of recent falls without trauma.  She is not competent to make decisions for herself .  Her husband has been providing all care for her and has been in discussion with memory care facilities for future transition    Fall at home, initial encounter Assessment & Plan: She has had multiple falls,  refuses to use a walker  and declines PT   Hypotension, chronic Assessment & Plan: Managed with midodrine .   No changes today    Other orders -     busPIRone HCl; Take 1 tablet (15 mg total) by mouth 3 (three) times daily.  Dispense: 90 tablet; Refill: 2 -     Triamcinolone Acetonide; Apply 1 Application topically daily.  Dispense: 30 g; Refill: 2     I spent 44 minutes on the day of this face to face encounter reviewing patient's  most recent visit with oncology,  prior relevant surgical and  non surgical procedures, recent  labs and imaging studies, counseling on weight management,  reviewing the assessment and plan with patient, and post visit ordering and reviewing of  diagnostics and therapeutics with patient  .   Follow-up: Return in about 6 months (around 02/12/2024).   Sherlene Shams, MD

## 2023-08-13 NOTE — Assessment & Plan Note (Signed)
 Prescribing triamcinolone for once daily use.

## 2023-08-13 NOTE — Patient Instructions (Signed)
 After we rule out UTI I will prescribe a medication for overactive bladder   Stacy Murillo appears to have eczema of the scalp.  Please apply triamcinolone to the hairline at the back of her head ONCE DAILY for 2-3 weeks and let me know if it is improving  Physical therapy  is recommended

## 2023-08-13 NOTE — Assessment & Plan Note (Signed)
 Her dementia is progressing.  She has very little short term memory,  incontinent of urine , has new onset loss of balance with history of recent falls without trauma.  She is not competent to make decisions for herself .  Her husband has been providing all care for her and has been in discussion with memory care facilities for future transition

## 2023-08-13 NOTE — Assessment & Plan Note (Signed)
 She has had multiple falls,  refuses to use a walker  and declines PT

## 2023-08-13 NOTE — Assessment & Plan Note (Addendum)
 Managed with midodrine .   No changes today

## 2023-08-15 LAB — URINALYSIS, ROUTINE W REFLEX MICROSCOPIC
Bilirubin Urine: NEGATIVE
Ketones, ur: NEGATIVE
Nitrite: NEGATIVE
RBC / HPF: NONE SEEN (ref 0–?)
Specific Gravity, Urine: 1.01 (ref 1.000–1.030)
Total Protein, Urine: NEGATIVE
Urine Glucose: NEGATIVE
Urobilinogen, UA: 0.2 (ref 0.0–1.0)
pH: 6 (ref 5.0–8.0)

## 2023-08-17 ENCOUNTER — Encounter: Payer: Self-pay | Admitting: Internal Medicine

## 2023-08-17 LAB — URINE CULTURE
MICRO NUMBER:: 16285308
SPECIMEN QUALITY:: ADEQUATE

## 2023-08-17 MED ORDER — CIPROFLOXACIN HCL 250 MG PO TABS: 250.0000 mg | ORAL_TABLET | Freq: Two times a day (BID) | ORAL | 0 refills | Status: DC

## 2023-08-17 MED ORDER — CIPROFLOXACIN HCL 250 MG PO TABS: 250.0000 mg | ORAL_TABLET | Freq: Two times a day (BID) | ORAL | 0 refills | Status: AC

## 2023-08-17 NOTE — Addendum Note (Signed)
 Addended by: Sherlene Shams on: 08/17/2023 10:56 PM   Modules accepted: Orders

## 2023-08-26 ENCOUNTER — Encounter: Payer: Self-pay | Admitting: Internal Medicine

## 2023-08-26 DIAGNOSIS — G8929 Other chronic pain: Secondary | ICD-10-CM

## 2023-08-27 ENCOUNTER — Telehealth: Payer: Self-pay | Admitting: Pharmacy Technician

## 2023-08-27 ENCOUNTER — Other Ambulatory Visit (HOSPITAL_COMMUNITY): Payer: Self-pay

## 2023-08-27 MED ORDER — FESOTERODINE FUMARATE ER 4 MG PO TB24: 4.0000 mg | ORAL_TABLET | Freq: Every day | ORAL | 1 refills | Status: AC

## 2023-08-27 NOTE — Telephone Encounter (Signed)
 Pharmacy Patient Advocate Encounter   Received notification from CoverMyMeds that prior authorization for Fesoterodine Fumarate ER 4MG  er tablets is required/requested.   Insurance verification completed.   The patient is insured through [facility] .   Per test claim: PA required; PA started via CoverMyMeds. KEY [id number] . Please see clinical question(s) below that I am not finding the answer to in her chart and advise.

## 2023-08-27 NOTE — Telephone Encounter (Signed)
 noted

## 2023-08-28 NOTE — Addendum Note (Signed)
 Addended by: Thersia Flax on: 08/28/2023 11:29 AM   Modules accepted: Orders

## 2023-09-03 ENCOUNTER — Encounter: Payer: Self-pay | Admitting: Internal Medicine

## 2023-10-10 ENCOUNTER — Other Ambulatory Visit: Payer: Self-pay | Admitting: Internal Medicine

## 2023-10-15 ENCOUNTER — Inpatient Hospital Stay
Admission: EM | Admit: 2023-10-15 | Discharge: 2023-10-24 | DRG: 481 | Disposition: A | Discharge status: Skilled Nursing Facility | Attending: Internal Medicine | Admitting: Internal Medicine

## 2023-10-15 ENCOUNTER — Emergency Department

## 2023-10-15 DIAGNOSIS — N1831 Chronic kidney disease, stage 3a: Secondary | ICD-10-CM | POA: Diagnosis present

## 2023-10-15 DIAGNOSIS — C83398 Diffuse large b-cell lymphoma of other extranodal and solid organ sites: Secondary | ICD-10-CM | POA: Diagnosis present

## 2023-10-15 DIAGNOSIS — K219 Gastro-esophageal reflux disease without esophagitis: Secondary | ICD-10-CM | POA: Diagnosis present

## 2023-10-15 DIAGNOSIS — I951 Orthostatic hypotension: Secondary | ICD-10-CM | POA: Diagnosis present

## 2023-10-15 DIAGNOSIS — S7290XA Unspecified fracture of unspecified femur, initial encounter for closed fracture: Secondary | ICD-10-CM | POA: Diagnosis present

## 2023-10-15 DIAGNOSIS — S72002A Fracture of unspecified part of neck of left femur, initial encounter for closed fracture: Secondary | ICD-10-CM | POA: Diagnosis present

## 2023-10-15 DIAGNOSIS — C7931 Secondary malignant neoplasm of brain: Secondary | ICD-10-CM | POA: Diagnosis not present

## 2023-10-15 DIAGNOSIS — Y929 Unspecified place or not applicable: Secondary | ICD-10-CM | POA: Diagnosis not present

## 2023-10-15 DIAGNOSIS — Z800 Family history of malignant neoplasm of digestive organs: Secondary | ICD-10-CM

## 2023-10-15 DIAGNOSIS — F0282 Dementia in other diseases classified elsewhere, unspecified severity, with psychotic disturbance: Secondary | ICD-10-CM | POA: Diagnosis present

## 2023-10-15 DIAGNOSIS — I129 Hypertensive chronic kidney disease with stage 1 through stage 4 chronic kidney disease, or unspecified chronic kidney disease: Secondary | ICD-10-CM | POA: Diagnosis present

## 2023-10-15 DIAGNOSIS — R339 Retention of urine, unspecified: Secondary | ICD-10-CM | POA: Diagnosis present

## 2023-10-15 DIAGNOSIS — I9589 Other hypotension: Secondary | ICD-10-CM | POA: Diagnosis present

## 2023-10-15 DIAGNOSIS — Z6837 Body mass index (BMI) 37.0-37.9, adult: Secondary | ICD-10-CM

## 2023-10-15 DIAGNOSIS — I2510 Atherosclerotic heart disease of native coronary artery without angina pectoris: Secondary | ICD-10-CM | POA: Diagnosis present

## 2023-10-15 DIAGNOSIS — W101XXA Fall (on)(from) sidewalk curb, initial encounter: Secondary | ICD-10-CM | POA: Diagnosis present

## 2023-10-15 DIAGNOSIS — N183 Chronic kidney disease, stage 3 unspecified: Secondary | ICD-10-CM | POA: Insufficient documentation

## 2023-10-15 DIAGNOSIS — R296 Repeated falls: Secondary | ICD-10-CM | POA: Diagnosis present

## 2023-10-15 DIAGNOSIS — Z825 Family history of asthma and other chronic lower respiratory diseases: Secondary | ICD-10-CM | POA: Diagnosis not present

## 2023-10-15 DIAGNOSIS — Z885 Allergy status to narcotic agent status: Secondary | ICD-10-CM

## 2023-10-15 DIAGNOSIS — E66812 Obesity, class 2: Secondary | ICD-10-CM | POA: Diagnosis present

## 2023-10-15 DIAGNOSIS — W19XXXA Unspecified fall, initial encounter: Secondary | ICD-10-CM

## 2023-10-15 DIAGNOSIS — F0280 Dementia in other diseases classified elsewhere without behavioral disturbance: Secondary | ICD-10-CM | POA: Diagnosis present

## 2023-10-15 DIAGNOSIS — Z751 Person awaiting admission to adequate facility elsewhere: Secondary | ICD-10-CM | POA: Diagnosis not present

## 2023-10-15 DIAGNOSIS — Z79899 Other long term (current) drug therapy: Secondary | ICD-10-CM

## 2023-10-15 DIAGNOSIS — F0284 Dementia in other diseases classified elsewhere, unspecified severity, with anxiety: Secondary | ICD-10-CM | POA: Diagnosis present

## 2023-10-15 DIAGNOSIS — Z9181 History of falling: Secondary | ICD-10-CM | POA: Diagnosis not present

## 2023-10-15 DIAGNOSIS — K5903 Drug induced constipation: Secondary | ICD-10-CM | POA: Diagnosis present

## 2023-10-15 DIAGNOSIS — Z8249 Family history of ischemic heart disease and other diseases of the circulatory system: Secondary | ICD-10-CM | POA: Diagnosis not present

## 2023-10-15 DIAGNOSIS — R338 Other retention of urine: Secondary | ICD-10-CM | POA: Clinically undetermined

## 2023-10-15 DIAGNOSIS — Z881 Allergy status to other antibiotic agents status: Secondary | ICD-10-CM

## 2023-10-15 DIAGNOSIS — Z808 Family history of malignant neoplasm of other organs or systems: Secondary | ICD-10-CM

## 2023-10-15 DIAGNOSIS — Z807 Family history of other malignant neoplasms of lymphoid, hematopoietic and related tissues: Secondary | ICD-10-CM | POA: Diagnosis not present

## 2023-10-15 DIAGNOSIS — T402X5A Adverse effect of other opioids, initial encounter: Secondary | ICD-10-CM | POA: Diagnosis present

## 2023-10-15 DIAGNOSIS — C8330 Diffuse large B-cell lymphoma, unspecified site: Secondary | ICD-10-CM | POA: Diagnosis present

## 2023-10-15 DIAGNOSIS — Z888 Allergy status to other drugs, medicaments and biological substances status: Secondary | ICD-10-CM

## 2023-10-15 LAB — CBC WITH DIFFERENTIAL/PLATELET
Abs Immature Granulocytes: 0.07 10*3/uL (ref 0.00–0.07)
Basophils Absolute: 0 10*3/uL (ref 0.0–0.1)
Basophils Relative: 0 %
Eosinophils Absolute: 0.2 10*3/uL (ref 0.0–0.5)
Eosinophils Relative: 2 %
HCT: 41 % (ref 36.0–46.0)
Hemoglobin: 13.8 g/dL (ref 12.0–15.0)
Immature Granulocytes: 1 %
Lymphocytes Relative: 21 %
Lymphs Abs: 2 10*3/uL (ref 0.7–4.0)
MCH: 30.1 pg (ref 26.0–34.0)
MCHC: 33.7 g/dL (ref 30.0–36.0)
MCV: 89.5 fL (ref 80.0–100.0)
Monocytes Absolute: 0.6 10*3/uL (ref 0.1–1.0)
Monocytes Relative: 6 %
Neutro Abs: 6.6 10*3/uL (ref 1.7–7.7)
Neutrophils Relative %: 70 %
Platelets: 165 10*3/uL (ref 150–400)
RBC: 4.58 MIL/uL (ref 3.87–5.11)
RDW: 12.8 % (ref 11.5–15.5)
WBC: 9.5 10*3/uL (ref 4.0–10.5)
nRBC: 0 % (ref 0.0–0.2)

## 2023-10-15 LAB — BASIC METABOLIC PANEL WITH GFR
Anion gap: 11 (ref 5–15)
BUN: 18 mg/dL (ref 8–23)
CO2: 24 mmol/L (ref 22–32)
Calcium: 9.9 mg/dL (ref 8.9–10.3)
Chloride: 104 mmol/L (ref 98–111)
Creatinine, Ser: 1.4 mg/dL — ABNORMAL HIGH (ref 0.44–1.00)
GFR, Estimated: 41 mL/min — ABNORMAL LOW (ref >60)
Glucose, Bld: 102 mg/dL — ABNORMAL HIGH (ref 70–99)
Potassium: 3.9 mmol/L (ref 3.5–5.1)
Sodium: 139 mmol/L (ref 135–145)

## 2023-10-15 LAB — TYPE AND SCREEN
ABO/RH(D): A NEG
Antibody Screen: NEGATIVE

## 2023-10-15 LAB — PROTIME-INR
INR: 0.9 (ref 0.8–1.2)
Prothrombin Time: 12.7 s (ref 11.4–15.2)

## 2023-10-15 MED ORDER — HYDROMORPHONE HCL 1 MG/ML IJ SOLN
0.5000 mg | INTRAMUSCULAR | Status: DC | PRN
  Administered 2023-10-16 – 2023-10-22 (×6): 0.5 mg via INTRAVENOUS
  Filled 2023-10-15 (×7): qty 0.5

## 2023-10-15 MED ORDER — OXYCODONE HCL 5 MG PO TABS
5.0000 mg | ORAL_TABLET | Freq: Four times a day (QID) | ORAL | Status: DC | PRN
  Administered 2023-10-17 – 2023-10-23 (×12): 5 mg via ORAL
  Filled 2023-10-15 (×14): qty 1

## 2023-10-15 MED ORDER — ACETAMINOPHEN 500 MG PO TABS
1000.0000 mg | ORAL_TABLET | Freq: Three times a day (TID) | ORAL | Status: DC
  Administered 2023-10-15 – 2023-10-16 (×2): 1000 mg via ORAL
  Filled 2023-10-15 (×2): qty 2

## 2023-10-15 MED ORDER — MIDODRINE HCL 5 MG PO TABS: 7.5000 mg | ORAL_TABLET | Freq: Three times a day (TID) | ORAL | Status: DC

## 2023-10-15 MED ORDER — MORPHINE SULFATE (PF) 4 MG/ML IV SOLN
4.0000 mg | Freq: Once | INTRAVENOUS | Status: AC
  Administered 2023-10-15: 4 mg via INTRAVENOUS
  Filled 2023-10-15: qty 1

## 2023-10-15 MED ORDER — ONDANSETRON HCL 4 MG/2ML IJ SOLN: 4.0000 mg | Freq: Four times a day (QID) | INTRAMUSCULAR | Status: DC | PRN

## 2023-10-15 MED ORDER — CEFAZOLIN SODIUM-DEXTROSE 2-4 GM/100ML-% IV SOLN
2.0000 g | INTRAVENOUS | Status: AC
  Administered 2023-10-16: 2 g via INTRAVENOUS

## 2023-10-15 MED ORDER — BUSPIRONE HCL 10 MG PO TABS
15.0000 mg | ORAL_TABLET | Freq: Three times a day (TID) | ORAL | Status: DC
  Administered 2023-10-15 – 2023-10-24 (×23): 15 mg via ORAL
  Filled 2023-10-15 (×26): qty 2

## 2023-10-15 MED ORDER — FESOTERODINE FUMARATE ER 4 MG PO TB24
4.0000 mg | ORAL_TABLET | Freq: Every day | ORAL | Status: DC
  Administered 2023-10-16 – 2023-10-24 (×9): 4 mg via ORAL
  Filled 2023-10-15 (×9): qty 1

## 2023-10-15 MED ORDER — POLYETHYLENE GLYCOL 3350 17 G PO PACK: 17.0000 g | PACK | Freq: Every day | ORAL | Status: DC | PRN

## 2023-10-15 NOTE — Assessment & Plan Note (Signed)
 History of CKD stage III with baseline creatinine ranging between 1.1 and 1.4, currently at baseline.

## 2023-10-15 NOTE — Assessment & Plan Note (Signed)
 History of cognitive decline after undergoing whole brain radiation.  She is currently at baseline.  - Delirium precautions

## 2023-10-15 NOTE — Assessment & Plan Note (Signed)
-   Continue home midodrine  7.5 mg 3 times daily

## 2023-10-15 NOTE — Assessment & Plan Note (Addendum)
 Patient is presenting with a left, nondisplaced femoral neck fracture after a mechanical ground-level fall.  Given low impact, likely affected by osteoporosis.  - Orthopedic surgery consulted; appreciate their recommendations - N.p.o. after midnight - Pain control with Tylenol , oxycodone , and Dilaudid  - Nonweightbearing - Continuous pulse oximetry while receiving IV opioids - Postop PT/OT per orthopedic surgery

## 2023-10-15 NOTE — ED Triage Notes (Signed)
 [facility]. Pt tripped and fell. No LOC and no blood thinners. Pt complains of bilateral hip and leg pain.  EMS gave 75mcg of Fent; 4mg  of zofran 

## 2023-10-15 NOTE — Assessment & Plan Note (Signed)
 History of recurrent diffuse large B-cell lymphoma, currently under surveillance only.  - Continue outpatient follow-up with oncology

## 2023-10-15 NOTE — ED Provider Notes (Signed)
 [facility] Provider Note    Event Date/Time   First MD Initiated Contact with Patient 10/15/23 1951     (approximate)   History   No chief complaint on file.   HPI  Stacy Murillo is a 68 y.o. female who presents to the ED for evaluation of No chief complaint on file.   Reviewed PCP visit from April.  History of diffuse large B-cell lymphoma, secondary lymphoma of the brain and progressive cognitive decline.  Recurrent falls.  No anticoagulation.  Patient presents after reported mechanical fall.  After getting out of the car to go to the restaurant for dinner with her husband, tripped on a curb and fell and struck her left hip.  No head trauma or syncope.   Physical Exam   Triage Vital Signs: ED Triage Vitals  Encounter Vitals Group     BP      Systolic BP Percentile      Diastolic BP Percentile      Pulse      Resp      Temp      Temp src      SpO2      Weight      Height      Head Circumference      Peak Flow      Pain Score      Pain Loc      Pain Education      Exclude from Growth Chart     Most recent vital signs: Vitals:   10/15/23 1957  BP: (!) 153/85  Pulse: 72  Resp: 20  Temp: 97.6 F (36.4 C)  SpO2: 100%    General: Awake, no distress.  CV:  Good peripheral perfusion.  Resp:  Normal effort.  Abd:  No distention.  MSK:  No deformity noted.  Tenderness over the left hip, refusal to move the left leg.  Left foot is distally neurovascularly intact. Neuro:  No focal deficits appreciated. Other:     ED Results / Procedures / Treatments   Labs (all labs ordered are listed, but only abnormal results are displayed) Labs Reviewed  BASIC METABOLIC PANEL WITH GFR - Abnormal; Notable for the following components:      Result Value   Glucose, Bld 102 (*)    Creatinine, Ser 1.40 (*)    GFR, Estimated 41 (*)    All other components within normal limits  PROTIME-INR  CBC WITH DIFFERENTIAL/PLATELET  TYPE AND SCREEN     EKG Sinus rhythm with a rate of 66 bpm.  Normal axis and intervals without clear signs of acute ischemia.  RADIOLOGY CXR interpreted by me without evidence of acute cardiopulmonary pathology. Plain film of the pelvis and left hip interpreted by me without evidence of acute pathology  Official radiology report(s): DG Chest Portable 1 View Result Date: 10/15/2023 CLINICAL DATA:  Fall EXAM: PORTABLE CHEST 1 VIEW COMPARISON:  Chest x-ray 02/25/2022 FINDINGS: Left chest port catheter tip ends at the brachiocephalic SVC junction, unchanged. The heart size and mediastinal contours are within normal limits. Both lungs are clear. The visualized skeletal structures are unremarkable. IMPRESSION: No active disease. Electronically Signed   By: Tyron Gallon M.D.   On: 10/15/2023 20:38   DG HIP UNILAT W OR W/O PELVIS 2-3 VIEWS LEFT Result Date: 10/15/2023 CLINICAL DATA:  Fall with left hip pain. EXAM: DG HIP (WITH OR WITHOUT PELVIS) 2-3V LEFT COMPARISON:  Left hip x-ray 07/15/2020 FINDINGS: The bones are osteopenic.  There is an acute fracture through the left femoral neck which is nondisplaced. There is no dislocation. Joint spaces are well maintained. Soft tissues are within normal limits. IMPRESSION: Acute nondisplaced fracture through the left femoral neck. Electronically Signed   By: Tyron Gallon M.D.   On: 10/15/2023 20:37    PROCEDURES and INTERVENTIONS:  .1-3 Lead EKG Interpretation  Performed by: Arline Bennett, MD Authorized by: Arline Bennett, MD     Interpretation: normal     ECG rate:  74   ECG rate assessment: normal     Rhythm: sinus rhythm     Ectopy: none     Conduction: normal     Medications - No data to display   IMPRESSION / MDM / ASSESSMENT AND PLAN / ED COURSE  I reviewed the triage vital signs and the nursing notes.  Differential diagnosis includes, but is not limited to, pelvis fracture, hip fracture, syncope, rib fracture  {Patient presents with symptoms of an acute  illness or injury that is potentially life-threatening.  Patient presents with left hip pain after mechanical fall with evidence of a femoral neck fracture requiring medical admission to facilitate orthopedic fixation.  No other evidence of trauma on exam.  No neurologic or vascular deficits.  Screening blood work with CKD near baseline, normal CBC.  Discussed with hospitalist and orthopedist.      FINAL CLINICAL IMPRESSION(S) / ED DIAGNOSES   Final diagnoses:  Displaced fracture of left femoral neck (HCC)  Fall, initial encounter     Rx / DC Orders   ED Discharge Orders     None        Note:  This document was prepared using Dragon voice recognition software and may include unintentional dictation errors.   Arline Bennett, MD 10/15/23 2051

## 2023-10-15 NOTE — H&P (Addendum)
 History and Physical    Patient: Stacy Murillo OZH:[id number] DOB: 1955/08/15 DOA: 10/15/2023 DOS: the patient was seen and examined on 10/15/2023 PCP: Thersia Flax, MD  Patient coming from: Home  Chief Complaint: No chief complaint on file.  HPI: Stacy Murillo is a 68 y.o. 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  Mrs. Stacy Murillo states she and her husband were on the way to dinner, and he was helping her out of the car.  When she went to take her first step onto the ramp, her foot hit the curb and she fell onto her left side.  Due to severe pain and inability to bear weight since then, EMS was called.  No loss of consciousness or head trauma noted.  Mr. Stacy Murillo states that he was holding onto her when she fell and so she did not hit the ground very hard.  ED course: On arrival to the ED, patient is hypertensive at 153/85 with a heart rate of 72.  She was saturating at 100% on room air.  She was afebrile at 97.6.  Initial workup notable for unremarkable CBC, creatinine 1.40 with GFR 41.  Chest x-ray with no active disease.  Hip x-ray with acute nondisplaced fracture through the left femoral neck.  Orthopedic surgery consulted with plans for the OR tomorrow.  [facility] contacted for admission.   Review of Systems: As mentioned in the history of present illness. All other systems reviewed and are negative.  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   Leg cramps    Lymphoma (HCC) 09/14/2015   Monoclonal B cell  lymphoma   Night sweats    Osteoporosis    Overweight(278.02)    Obesity   Palpitations    Shortness of breath dyspnea    Tachycardia    Thrombocytopenia (HCC)    Past Surgical History:  Procedure Laterality Date   ABDOMINAL HYSTERECTOMY  2005   BONE MARROW BIOPSY  09/14/15   CHOLECYSTECTOMY  1997   COLONOSCOPY  07/05/2014   ESOPHAGOGASTRODUODENOSCOPY  07/05/2014   INGUINAL LYMPH NODE BIOPSY Right 10/05/2015   Procedure: INGUINAL LYMPH NODE BIOPSY;  Surgeon: Jerlean Mood, MD;  Location: [facility] ORS;  Service: General;  Laterality: Right;   PERIPHERAL VASCULAR CATHETERIZATION N/A 09/27/2015   Procedure: Melville Stade Cath Insertion;  Surgeon: Celso College, MD;  Location: [facility] INVASIVE CV LAB;  Service: Cardiovascular;  Laterality: N/A;   PORTA CATH REMOVAL Right 06/03/2017   Procedure: PORTA CATH REMOVAL, with venous thrombectomy;  Surgeon: Celso College, MD;  Location: [facility] INVASIVE CV LAB;  Service: Cardiovascular;  Laterality: Right;   PORTACATH PLACEMENT Right    Social History:  reports that she has never smoked. She has never used smokeless tobacco. She reports that she does not drink alcohol and does not use drugs.  Allergies  Allergen Reactions   Ondansetron  Hcl Other (See Comments)    Severe constipation    Oxycodone      Biliary colic with opioids s/p cholecystectomy     Rituximab  Other (  See Comments)    Pt reports throat tightness and facial swelling and redness during infusion of Rituxan     Family History  Problem Relation Age of Onset   Heart disease Father        CABG x 4   Multiple myeloma Father    Hypertension Mother    Pancreatic cancer Mother    Ulcers Mother    Asthma Mother    Brain cancer Maternal Uncle    Multiple myeloma Maternal Uncle    Diabetes Neg Hx    Breast cancer Neg Hx     Prior to Admission medications   Medication Sig Start Date End Date Taking? Authorizing Provider  acetaminophen  (TYLENOL ) 650 MG CR tablet Take 650 mg by mouth every 8  (eight) hours as needed for pain.    [provider]  busPIRone  (BUSPAR ) 15 MG tablet Take 1 tablet (15 mg total) by mouth 3 (three) times daily. 08/13/23   Thersia Flax, MD  fesoterodine  (TOVIAZ ) 4 MG TB24 tablet Take 1 tablet (4 mg total) by mouth daily. 08/27/23   Thersia Flax, MD  midodrine  (PROAMATINE ) 5 MG tablet TAKE ONE AND ONE-HALF TABLETS THREE TIMES A DAY 10/10/23   Brahmanday, Govinda R, MD  triamcinolone  cream (KENALOG ) 0.1 % Apply 1 Application topically daily. 08/13/23   Thersia Flax, MD    Physical Exam: Vitals:   10/15/23 1957 10/15/23 2201 10/15/23 2224  BP: (!) 153/85 (!) 153/73 (!) 154/65  Pulse: 72 67 71  Resp: 20 15 17   Temp: 97.6 F (36.4 C)  97.8 F (36.6 C)  TempSrc: Oral    SpO2: 100% 100% 100%   Physical Exam Vitals and nursing note reviewed.  Constitutional:      General: She is not in acute distress.    Appearance: She is normal weight. She is not toxic-appearing.  HENT:     Head: Normocephalic and atraumatic.     Mouth/Throat:     Mouth: Mucous membranes are moist.     Pharynx: Oropharynx is clear.  Cardiovascular:     Rate and Rhythm: Normal rate and regular rhythm.     Pulses:          Dorsalis pedis pulses are 2+ on the right side and 2+ on the left side.     Heart sounds: No murmur heard.    No gallop.  Pulmonary:     Effort: Pulmonary effort is normal. No respiratory distress.     Breath sounds: Normal breath sounds.  Musculoskeletal:     Right lower leg: No edema.     Left lower leg: No edema.  Skin:    General: Skin is warm and dry.  Neurological:     Mental Status: She is alert.     Comments:  Patient is alert and oriented, however unclear on details regarding her medical history.  Known history of cognitive decline.  Psychiatric:        Mood and Affect: Mood normal.        Behavior: Behavior normal.    Data Reviewed: CBC with WBC 9.5, hemoglobin at 13.8, platelets 165 BMP with sodium of 139, potassium 3.9, bicarb  24, glucose 102, BUN 18, creatinine 1.40 GFR 41  EKG personally reviewed.  Sinus rhythm with rate of 66.  No acute ischemic changes  DG Chest Portable 1 View Result Date: 10/15/2023 CLINICAL DATA:  Fall EXAM: PORTABLE CHEST 1 VIEW COMPARISON:  Chest x-ray 02/25/2022 FINDINGS: Left chest port catheter tip ends  at the brachiocephalic SVC junction, unchanged. The heart size and mediastinal contours are within normal limits. Both lungs are clear. The visualized skeletal structures are unremarkable. IMPRESSION: No active disease. Electronically Signed   By: Tyron Gallon M.D.   On: 10/15/2023 20:38   DG HIP UNILAT W OR W/O PELVIS 2-3 VIEWS LEFT Result Date: 10/15/2023 CLINICAL DATA:  Fall with left hip pain. EXAM: DG HIP (WITH OR WITHOUT PELVIS) 2-3V LEFT COMPARISON:  Left hip x-ray 07/15/2020 FINDINGS: The bones are osteopenic. There is an acute fracture through the left femoral neck which is nondisplaced. There is no dislocation. Joint spaces are well maintained. Soft tissues are within normal limits. IMPRESSION: Acute nondisplaced fracture through the left femoral neck. Electronically Signed   By: Tyron Gallon M.D.   On: 10/15/2023 20:37   There are no new results to review at this time.  Assessment and Plan:  * Femur fracture (HCC) Patient is presenting with a left, nondisplaced femoral neck fracture after a mechanical ground-level fall.  Given low impact, likely affected by osteoporosis.  - Orthopedic surgery consulted; appreciate their recommendations - N.p.o. after midnight - Pain control with Tylenol , oxycodone , and Dilaudid  - Nonweightbearing - Continuous pulse oximetry while receiving IV opioids - Postop PT/OT per orthopedic surgery  CKD (chronic kidney disease), stage III (HCC) History of CKD stage III with baseline creatinine ranging between 1.1 and 1.4, currently at baseline.  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  She is currently at baseline.  - Delirium precautions  DLBCL (diffuse large B cell lymphoma) (HCC) History of recurrent diffuse large B-cell lymphoma, currently under surveillance only.  - Continue outpatient follow-up with oncology  Advance Care Planning:   Code Status: Full Code   Consults: Orthopedic surgery  Family Communication: Patient's husband updated at bedside  Severity of Illness: The appropriate patient status for this patient is INPATIENT. Inpatient status is judged to be reasonable and necessary in order to provide the required intensity of service to ensure the patient's safety. The patient's presenting symptoms, physical exam findings, and initial radiographic and laboratory data in the context of their chronic comorbidities is felt to place them at high risk for further clinical deterioration. Furthermore, it is not anticipated that the patient will be medically stable for discharge from the hospital within 2 midnights of admission.   * I certify that at the point of admission it is my clinical judgment that the patient will require inpatient hospital care spanning beyond 2 midnights from the point of admission due to high intensity of service, high risk for further deterioration and high frequency of surveillance required.*  Author: Avi Body, MD 10/15/2023 10:25 PM  For on call review [email or web address].

## 2023-10-16 ENCOUNTER — Inpatient Hospital Stay

## 2023-10-16 ENCOUNTER — Encounter
Admission: EM | Disposition: A | Discharge status: Skilled Nursing Facility | Payer: Self-pay | Source: Home / Self Care | Attending: Internal Medicine

## 2023-10-16 ENCOUNTER — Encounter: Payer: Self-pay | Admitting: Internal Medicine

## 2023-10-16 ENCOUNTER — Other Ambulatory Visit: Payer: Self-pay

## 2023-10-16 DIAGNOSIS — S72002A Fracture of unspecified part of neck of left femur, initial encounter for closed fracture: Secondary | ICD-10-CM | POA: Diagnosis not present

## 2023-10-16 HISTORY — PX: HIP PINNING,CANNULATED: SHX1758

## 2023-10-16 LAB — BASIC METABOLIC PANEL WITH GFR
Anion gap: 11 (ref 5–15)
BUN: 17 mg/dL (ref 8–23)
CO2: 25 mmol/L (ref 22–32)
Calcium: 9.7 mg/dL (ref 8.9–10.3)
Chloride: 102 mmol/L (ref 98–111)
Creatinine, Ser: 1.41 mg/dL — ABNORMAL HIGH (ref 0.44–1.00)
GFR, Estimated: 40 mL/min — ABNORMAL LOW (ref >60)
Glucose, Bld: 129 mg/dL — ABNORMAL HIGH (ref 70–99)
Potassium: 4 mmol/L (ref 3.5–5.1)
Sodium: 138 mmol/L (ref 135–145)

## 2023-10-16 LAB — HIV ANTIBODY (ROUTINE TESTING W REFLEX): HIV Screen 4th Generation wRfx: NONREACTIVE

## 2023-10-16 SURGERY — FIXATION, FEMUR, NECK, PERCUTANEOUS, USING SCREW
Anesthesia: General | Site: Hip | Laterality: Left

## 2023-10-16 MED ORDER — ENOXAPARIN SODIUM 40 MG/0.4ML IJ SOSY
40.0000 mg | PREFILLED_SYRINGE | INTRAMUSCULAR | Status: DC
  Administered 2023-10-17 – 2023-10-24 (×8): 40 mg via SUBCUTANEOUS
  Filled 2023-10-16 (×8): qty 0.4

## 2023-10-16 MED ORDER — ROCURONIUM BROMIDE 100 MG/10ML IV SOLN
INTRAVENOUS | Status: DC | PRN
  Administered 2023-10-16: 50 mg via INTRAVENOUS

## 2023-10-16 MED ORDER — DEXAMETHASONE SODIUM PHOSPHATE 10 MG/ML IJ SOLN
INTRAMUSCULAR | Status: AC
  Filled 2023-10-16: qty 1

## 2023-10-16 MED ORDER — OXYCODONE HCL 5 MG PO TABS: 5.0000 mg | ORAL_TABLET | Freq: Once | ORAL | Status: DC | PRN

## 2023-10-16 MED ORDER — ONDANSETRON HCL 4 MG/2ML IJ SOLN: 4.0000 mg | Freq: Four times a day (QID) | INTRAMUSCULAR | Status: DC | PRN

## 2023-10-16 MED ORDER — SUGAMMADEX SODIUM 200 MG/2ML IV SOLN
INTRAVENOUS | Status: DC | PRN
  Administered 2023-10-16: 200 mg via INTRAVENOUS

## 2023-10-16 MED ORDER — ROCURONIUM BROMIDE 10 MG/ML (PF) SYRINGE
PREFILLED_SYRINGE | INTRAVENOUS | Status: AC
  Filled 2023-10-16: qty 10

## 2023-10-16 MED ORDER — ACETAMINOPHEN 325 MG PO TABS
325.0000 mg | ORAL_TABLET | Freq: Four times a day (QID) | ORAL | Status: DC | PRN
  Administered 2023-10-22 – 2023-10-23 (×3): 650 mg via ORAL
  Filled 2023-10-16 (×4): qty 2

## 2023-10-16 MED ORDER — TRAZODONE HCL 50 MG PO TABS
50.0000 mg | ORAL_TABLET | Freq: Once | ORAL | Status: AC
  Administered 2023-10-17: 50 mg via ORAL
  Filled 2023-10-16: qty 1

## 2023-10-16 MED ORDER — PROPOFOL 1000 MG/100ML IV EMUL
INTRAVENOUS | Status: AC
  Filled 2023-10-16: qty 100

## 2023-10-16 MED ORDER — PROPOFOL 10 MG/ML IV BOLUS
INTRAVENOUS | Status: DC | PRN
  Administered 2023-10-16: 100 mg via INTRAVENOUS
  Administered 2023-10-16: 100 ug/kg/min via INTRAVENOUS

## 2023-10-16 MED ORDER — GLYCOPYRROLATE 0.2 MG/ML IJ SOLN
INTRAMUSCULAR | Status: AC
  Filled 2023-10-16: qty 1

## 2023-10-16 MED ORDER — METOCLOPRAMIDE HCL 5 MG PO TABS: 5.0000 mg | ORAL_TABLET | Freq: Three times a day (TID) | ORAL | Status: DC | PRN

## 2023-10-16 MED ORDER — FENTANYL CITRATE (PF) 100 MCG/2ML IJ SOLN: 25.0000 ug | INTRAMUSCULAR | Status: DC | PRN

## 2023-10-16 MED ORDER — LIDOCAINE HCL (CARDIAC) PF 100 MG/5ML IV SOSY
PREFILLED_SYRINGE | INTRAVENOUS | Status: DC | PRN
  Administered 2023-10-16: 100 mg via INTRAVENOUS

## 2023-10-16 MED ORDER — DEXAMETHASONE SODIUM PHOSPHATE 10 MG/ML IJ SOLN
INTRAMUSCULAR | Status: DC | PRN
  Administered 2023-10-16: 5 mg via INTRAVENOUS

## 2023-10-16 MED ORDER — ACETAMINOPHEN 10 MG/ML IV SOLN
INTRAVENOUS | Status: AC
  Filled 2023-10-16: qty 100

## 2023-10-16 MED ORDER — ONDANSETRON HCL 4 MG PO TABS
4.0000 mg | ORAL_TABLET | Freq: Four times a day (QID) | ORAL | Status: DC | PRN
  Filled 2023-10-16: qty 1

## 2023-10-16 MED ORDER — BISACODYL 10 MG RE SUPP: 10.0000 mg | Freq: Every day | RECTAL | Status: DC | PRN

## 2023-10-16 MED ORDER — CEFAZOLIN SODIUM-DEXTROSE 2-4 GM/100ML-% IV SOLN
2.0000 g | Freq: Four times a day (QID) | INTRAVENOUS | Status: AC
  Administered 2023-10-17 (×2): 2 g via INTRAVENOUS
  Filled 2023-10-16 (×2): qty 100

## 2023-10-16 MED ORDER — MIDAZOLAM HCL 2 MG/2ML IJ SOLN
INTRAMUSCULAR | Status: DC | PRN
  Administered 2023-10-16: 2 mg via INTRAVENOUS

## 2023-10-16 MED ORDER — MAGNESIUM HYDROXIDE 400 MG/5ML PO SUSP
30.0000 mL | Freq: Every day | ORAL | Status: DC | PRN
  Administered 2023-10-20: 30 mL via ORAL
  Filled 2023-10-16: qty 30

## 2023-10-16 MED ORDER — KETOROLAC TROMETHAMINE 15 MG/ML IJ SOLN
7.5000 mg | Freq: Four times a day (QID) | INTRAMUSCULAR | Status: AC
  Administered 2023-10-17 (×4): 7.5 mg via INTRAVENOUS
  Filled 2023-10-16 (×4): qty 1

## 2023-10-16 MED ORDER — DOCUSATE SODIUM 100 MG PO CAPS
100.0000 mg | ORAL_CAPSULE | Freq: Two times a day (BID) | ORAL | Status: DC
  Administered 2023-10-17 – 2023-10-24 (×13): 100 mg via ORAL
  Filled 2023-10-16 (×17): qty 1

## 2023-10-16 MED ORDER — DIPHENHYDRAMINE HCL 12.5 MG/5ML PO ELIX
12.5000 mg | ORAL_SOLUTION | ORAL | Status: DC | PRN
  Administered 2023-10-17 – 2023-10-18 (×2): 25 mg via ORAL
  Filled 2023-10-16 (×2): qty 10

## 2023-10-16 MED ORDER — LACTATED RINGERS IV SOLN: INTRAVENOUS | Status: DC | PRN

## 2023-10-16 MED ORDER — CEFAZOLIN SODIUM-DEXTROSE 2-4 GM/100ML-% IV SOLN
INTRAVENOUS | Status: AC
  Filled 2023-10-16: qty 100

## 2023-10-16 MED ORDER — OXYCODONE HCL 5 MG/5ML PO SOLN: 5.0000 mg | Freq: Once | ORAL | Status: DC | PRN

## 2023-10-16 MED ORDER — BUPIVACAINE-EPINEPHRINE (PF) 0.5% -1:200000 IJ SOLN
INTRAMUSCULAR | Status: DC | PRN
  Administered 2023-10-16: 30 mL

## 2023-10-16 MED ORDER — PHENYLEPHRINE 80 MCG/ML (10ML) SYRINGE FOR IV PUSH (FOR BLOOD PRESSURE SUPPORT)
PREFILLED_SYRINGE | INTRAVENOUS | Status: AC
  Filled 2023-10-16: qty 10

## 2023-10-16 MED ORDER — SODIUM CHLORIDE 0.9 % IV SOLN
INTRAVENOUS | Status: AC
Start: 2023-10-16 — End: 2023-10-17

## 2023-10-16 MED ORDER — 0.9 % SODIUM CHLORIDE (POUR BTL) OPTIME
TOPICAL | Status: DC | PRN
  Administered 2023-10-16: 500 mL

## 2023-10-16 MED ORDER — FLEET ENEMA RE ENEM: 1.0000 | ENEMA | Freq: Once | RECTAL | Status: DC | PRN

## 2023-10-16 MED ORDER — ACETAMINOPHEN 10 MG/ML IV SOLN
INTRAVENOUS | Status: DC | PRN
  Administered 2023-10-16: 1000 mg via INTRAVENOUS

## 2023-10-16 MED ORDER — FENTANYL CITRATE (PF) 100 MCG/2ML IJ SOLN
INTRAMUSCULAR | Status: DC | PRN
  Administered 2023-10-16 (×2): 50 ug via INTRAVENOUS

## 2023-10-16 MED ORDER — FENTANYL CITRATE (PF) 100 MCG/2ML IJ SOLN
INTRAMUSCULAR | Status: AC
Start: 2023-10-16 — End: ?
  Filled 2023-10-16: qty 2

## 2023-10-16 MED ORDER — PHENYLEPHRINE 80 MCG/ML (10ML) SYRINGE FOR IV PUSH (FOR BLOOD PRESSURE SUPPORT)
PREFILLED_SYRINGE | INTRAVENOUS | Status: DC | PRN
  Administered 2023-10-16 (×2): 160 ug via INTRAVENOUS

## 2023-10-16 MED ORDER — PHENYLEPHRINE HCL-NACL 20-0.9 MG/250ML-% IV SOLN
INTRAVENOUS | Status: DC | PRN
Start: 2023-10-16 — End: 2023-10-16
  Administered 2023-10-16: 30 ug/min via INTRAVENOUS

## 2023-10-16 MED ORDER — METOCLOPRAMIDE HCL 5 MG/ML IJ SOLN: 5.0000 mg | Freq: Three times a day (TID) | INTRAMUSCULAR | Status: DC | PRN

## 2023-10-16 MED ORDER — BUPIVACAINE-EPINEPHRINE (PF) 0.5% -1:200000 IJ SOLN
INTRAMUSCULAR | Status: AC
  Filled 2023-10-16: qty 30

## 2023-10-16 MED ORDER — MIDAZOLAM HCL 2 MG/2ML IJ SOLN
INTRAMUSCULAR | Status: AC
  Filled 2023-10-16: qty 2

## 2023-10-16 MED ORDER — ACETAMINOPHEN 500 MG PO TABS
1000.0000 mg | ORAL_TABLET | Freq: Four times a day (QID) | ORAL | Status: AC
  Administered 2023-10-17 (×4): 1000 mg via ORAL
  Filled 2023-10-16 (×4): qty 2

## 2023-10-16 MED ORDER — ONDANSETRON HCL 4 MG/2ML IJ SOLN
INTRAMUSCULAR | Status: DC | PRN
  Administered 2023-10-16: 4 mg via INTRAVENOUS

## 2023-10-16 SURGICAL SUPPLY — 34 items
BIT DRILL 4.8X200 CANN (BIT) IMPLANT
BNDG COHESIVE 4X5 TAN STRL LF (GAUZE/BANDAGES/DRESSINGS) ×1 IMPLANT
BNDG COHESIVE 6X5 TAN ST LF (GAUZE/BANDAGES/DRESSINGS) ×1 IMPLANT
CHLORAPREP W/TINT 26 (MISCELLANEOUS) ×2 IMPLANT
DRSG OPSITE POSTOP 3X4 (GAUZE/BANDAGES/DRESSINGS) ×1 IMPLANT
DRSG OPSITE POSTOP 4X6 (GAUZE/BANDAGES/DRESSINGS) IMPLANT
ELECTRODE REM PT RTRN 9FT ADLT (ELECTROSURGICAL) ×1 IMPLANT
GLOVE BIO SURGEON STRL SZ8 (GLOVE) ×2 IMPLANT
GLOVE INDICATOR 8.0 STRL GRN (GLOVE) ×1 IMPLANT
GOWN STRL REUS W/ TWL LRG LVL3 (GOWN DISPOSABLE) ×1 IMPLANT
GOWN STRL REUS W/ TWL XL LVL3 (GOWN DISPOSABLE) ×1 IMPLANT
HANDLE YANKAUER SUCT OPEN TIP (MISCELLANEOUS) ×1 IMPLANT
MANIFOLD NEPTUNE II (INSTRUMENTS) ×1 IMPLANT
MAT ABSORB FLUID 56X50 GRAY (MISCELLANEOUS) ×1 IMPLANT
NDL FILTER BLUNT 18X1 1/2 (NEEDLE) ×1 IMPLANT
NDL HYPO 22X1.5 SAFETY MO (MISCELLANEOUS) ×1 IMPLANT
NEEDLE FILTER BLUNT 18X1 1/2 (NEEDLE) ×1 IMPLANT
NEEDLE HYPO 22X1.5 SAFETY MO (MISCELLANEOUS) ×1 IMPLANT
PACK HIP COMPR (MISCELLANEOUS) ×1 IMPLANT
PENCIL SMOKE EVACUATOR (MISCELLANEOUS) ×1 IMPLANT
PIN GUIDE DRIL TIP 2.8X300 STE (PIN) IMPLANT
SCREW 16MM THREAD 6.5X75MM (Screw) IMPLANT
SCREW 16MM THREAD 6.5X85MM (Screw) IMPLANT
SCREW CANN THRD 6.5X70 (Screw) IMPLANT
STAPLER SKIN PROX 35W (STAPLE) ×1 IMPLANT
STRAP SAFETY 5IN WIDE (MISCELLANEOUS) ×1 IMPLANT
SUT PROLENE 2 0 FS (SUTURE) IMPLANT
SUT VIC AB 0 CT1 36 (SUTURE) ×1 IMPLANT
SUT VIC AB 0 SH 27 (SUTURE) IMPLANT
SUT VIC AB 2-0 CT1 TAPERPNT 27 (SUTURE) ×1 IMPLANT
SYR 20ML LL LF (SYRINGE) ×1 IMPLANT
SYR 5ML LL (SYRINGE) ×1 IMPLANT
TRAP FLUID SMOKE EVACUATOR (MISCELLANEOUS) ×1 IMPLANT
WATER STERILE IRR 500ML POUR (IV SOLUTION) ×1 IMPLANT

## 2023-10-16 NOTE — Anesthesia Postprocedure Evaluation (Signed)
 Anesthesia Post Note  Patient: Stacy Murillo  Procedure(s) Performed: FIXATION, FEMUR, NECK, PERCUTANEOUS, USING SCREW (Left: Hip)  Patient location during evaluation: PACU Anesthesia Type: General Level of consciousness: patient cooperative and awake (return to preoperative baseline) Pain management: pain level controlled Vital Signs Assessment: post-procedure vital signs reviewed and stable Respiratory status: spontaneous breathing, nonlabored ventilation and respiratory function stable Cardiovascular status: blood pressure returned to baseline and stable Postop Assessment: adequate PO intake Anesthetic complications: no   No notable events documented.   Last Vitals:  Vitals:   10/16/23 1845 10/16/23 1900  BP: (!) 124/99 125/77  Pulse: 96 87  Resp: 20 17  Temp:    SpO2: 99% 96%    Last Pain:  Vitals:   10/16/23 1845  TempSrc:   PainSc: Asleep                 Dorothey Gate

## 2023-10-16 NOTE — Consult Note (Signed)
 ORTHOPAEDIC CONSULTATION  REQUESTING PHYSICIAN: Read Camel, MD  Chief Complaint:   Left hip pain  History of Present Illness: Stacy Murillo is a 68 y.o. female with multiple medical problems including metastatic B-cell lymphoma, obesity, cognitive impairment, coronary artery disease with dysrhythmias, osteoporosis, and diverticulosis who normally lives independently with her husband, but does have a history of frequent falls.  The patient was in her usual state of health last evening when she apparently tripped and fell on a curb while going into a restaurant, landing on her left hip.  She was brought to the emergency room where x-rays in the emergency room demonstrated a valgus impacted left femoral neck fracture.  The patient denies any associated injuries.  She did not strike her head or lose consciousness.  The patient also denies any lightheadedness, dizziness, chest pain, shortness of breath, or other symptoms which may have precipitated her fall.  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   Leg cramps    Lymphoma (HCC) 09/14/2015   Monoclonal B cell lymphoma   Night sweats    Osteoporosis    Overweight(278.02)    Obesity   Palpitations    Shortness of breath dyspnea    Tachycardia    Thrombocytopenia (HCC)    Past Surgical History:  Procedure Laterality Date   ABDOMINAL HYSTERECTOMY  2005   BONE MARROW BIOPSY  09/14/15   CHOLECYSTECTOMY  1997   COLONOSCOPY  07/05/2014   ESOPHAGOGASTRODUODENOSCOPY  07/05/2014   INGUINAL LYMPH NODE BIOPSY Right 10/05/2015   Procedure: INGUINAL LYMPH NODE BIOPSY;  Surgeon:  Jerlean Mood, MD;  Location: [facility] ORS;  Service: General;  Laterality: Right;   PERIPHERAL VASCULAR CATHETERIZATION N/A 09/27/2015   Procedure: Melville Stade Cath Insertion;  Surgeon: Celso College, MD;  Location: [facility] INVASIVE CV LAB;  Service: Cardiovascular;  Laterality: N/A;   PORTA CATH REMOVAL Right 06/03/2017   Procedure: PORTA CATH REMOVAL, with venous thrombectomy;  Surgeon: Celso College, MD;  Location: [facility] INVASIVE CV LAB;  Service: Cardiovascular;  Laterality: Right;   PORTACATH PLACEMENT Right    Social History   Socioeconomic History   Marital status: Married    Spouse name: Not on file   Number of children: Not on file   Years of education: Not on file   Highest education level: Not on file  Occupational History   Not on file  Tobacco Use   Smoking status: Never   Smokeless tobacco: Never  Vaping Use   Vaping status: Never Used  Substance and Sexual Activity   Alcohol use: No    Alcohol/week: 0.0 standard drinks of alcohol   Drug use: No   Sexual activity: Yes    Birth control/protection: None  Other Topics Concern   Not on file  Social History Narrative   Married   Does not get regular exercise   Social Drivers of Health   Financial Resource Strain: Low Risk  (07/08/2023)   Overall Financial Resource Strain (CARDIA)    Difficulty of Paying Living Expenses: Not hard at all  Food Insecurity: No Food Insecurity (10/15/2023)   Hunger Vital Sign    Worried About Programme researcher, broadcasting/film/video in  the Last Year: Never true    Ran Out of Food in the Last Year: Never true  Transportation Needs: No Transportation Needs (10/15/2023)   PRAPARE - Administrator, Civil Service (Medical): No    Lack of Transportation (Non-Medical): No  Physical Activity: Inactive (07/08/2023)   Exercise Vital Sign    Days of Exercise per Week: 0 days    Minutes of Exercise per Session: 0 min  Stress: No Stress Concern Present (07/08/2023)   [employer] of Occupational Health -  Occupational Stress Questionnaire    Feeling of Stress : Only a little  Social Connections: Moderately Isolated (10/15/2023)   Social Connection and Isolation Panel [NHANES]    Frequency of Communication with Friends and Family: More than three times a week    Frequency of Social Gatherings with Friends and Family: More than three times a week    Attends Religious Services: Never    Database administrator or Organizations: No    Attends Engineer, structural: Never    Marital Status: Married   Family History  Problem Relation Age of Onset   Heart disease Father        CABG x 4   Multiple myeloma Father    Hypertension Mother    Pancreatic cancer Mother    Ulcers Mother    Asthma Mother    Brain cancer Maternal Uncle    Multiple myeloma Maternal Uncle    Diabetes Neg Hx    Breast cancer Neg Hx    Allergies  Allergen Reactions   Ondansetron  Hcl Other (See Comments)    Severe constipation    Oxycodone      Biliary colic with opioids s/p cholecystectomy     Rituximab  Other (See Comments)    Pt reports throat tightness and facial swelling and redness during infusion of Rituxan    Prior to Admission medications   Medication Sig Start Date End Date Taking? Authorizing Provider  acetaminophen  (TYLENOL ) 650 MG CR tablet Take 650 mg by mouth every 8 (eight) hours as needed for pain.   Yes [provider]  busPIRone  (BUSPAR ) 15 MG tablet Take 1 tablet (15 mg total) by mouth 3 (three) times daily. 08/13/23  Yes Thersia Flax, MD  fesoterodine  (TOVIAZ ) 4 MG TB24 tablet Take 1 tablet (4 mg total) by mouth daily. 08/27/23  Yes Thersia Flax, MD  meloxicam  (MOBIC ) 7.5 MG tablet Take 7.5 mg by mouth 2 (two) times daily with a meal. 10/02/23  Yes [provider]  midodrine  (PROAMATINE ) 5 MG tablet TAKE ONE AND ONE-HALF TABLETS THREE TIMES A DAY 10/10/23  Yes Brahmanday, Govinda R, MD  triamcinolone  cream (KENALOG ) 0.1 % Apply 1 Application topically daily. 08/13/23  Yes  Thersia Flax, MD   DG Chest Portable 1 View Result Date: 10/15/2023 CLINICAL DATA:  Fall EXAM: PORTABLE CHEST 1 VIEW COMPARISON:  Chest x-ray 02/25/2022 FINDINGS: Left chest port catheter tip ends at the brachiocephalic SVC junction, unchanged. The heart size and mediastinal contours are within normal limits. Both lungs are clear. The visualized skeletal structures are unremarkable. IMPRESSION: No active disease. Electronically Signed   By: Tyron Gallon M.D.   On: 10/15/2023 20:38   DG HIP UNILAT W OR W/O PELVIS 2-3 VIEWS LEFT Result Date: 10/15/2023 CLINICAL DATA:  Fall with left hip pain. EXAM: DG HIP (WITH OR WITHOUT PELVIS) 2-3V LEFT COMPARISON:  Left hip x-ray 07/15/2020 FINDINGS: The bones are osteopenic. There is an acute fracture through the  left femoral neck which is nondisplaced. There is no dislocation. Joint spaces are well maintained. Soft tissues are within normal limits. IMPRESSION: Acute nondisplaced fracture through the left femoral neck. Electronically Signed   By: Tyron Gallon M.D.   On: 10/15/2023 20:37    Positive ROS: All other systems have been reviewed and were otherwise negative with the exception of those mentioned in the HPI and as above.  Physical Exam: General:  Alert, no acute distress Psychiatric:  Patient appears to be competent for consent with normal mood and affect   Cardiovascular:  No pedal edema Respiratory:  No wheezing, non-labored breathing GI:  Abdomen is soft and non-tender Skin:  No lesions in the area of chief complaint Neurologic:  Sensation intact distally Lymphatic:  No axillary or cervical lymphadenopathy  Orthopedic Exam:  Orthopedic examination is limited to the left hip and lower extremity.  The left lower extremity appears to be symmetrically aligned as compared to the right lower extremity.  Skin inspection around the left hip is unremarkable.  No swelling, erythema, ecchymosis, abrasions, or other skin abnormalities are identified.  She  has mild tenderness palpation of the anterolateral aspect of the left hip.  She has more severe pain with any attempted active or passive motion of the hip.  She is grossly neurovascularly intact to the left lower extremity and foot.  X-rays:  Recent x-rays of the pelvis and left hip are available for review and have been reviewed by myself.  The findings are as described above.  The hip joint itself appears to be in good condition.  The fracture appears to be primarily valgus impacted fracture with good maintenance of alignment.  Assessment: Valgus impacted left femoral neck fracture.  Plan: The treatment options, including both surgical and nonsurgical choices, have been discussed in detail with the patient and her husband.  The patient and her husband would like to proceed with surgical intervention to include an in situ cannulated screw fixation of the valgus impacted left femoral neck fracture.  The risks (including bleeding, infection, nerve and/or blood vessel injury, persistent or recurrent pain, loosening or failure of the components, leg length inequality, dislocation, need for further surgery, blood clots, strokes, heart attacks or arrhythmias, pneumonia, etc.) and benefits of the surgical procedure were discussed. The patient and her husband state their understanding and agree to proceed.  A formal written consent will be obtained by the nursing staff.  Thank you for asking me to participate in the care of this most pleasant unfortunate woman.  I will be happy to follow her with you.   Lonnie Roberts, MD  Beeper #:  [phone]  10/16/2023 9:24 AM

## 2023-10-16 NOTE — Progress Notes (Signed)
 Personal belongs (clothing, watch, and 3 rings) were given to the husband and husband reports took them home with him.  Per husband, patient does not have a cell phone or any electronics on her a this time.

## 2023-10-16 NOTE — Op Note (Signed)
 10/16/2023  6:37 PM  Patient:   Stacy Murillo [place]  Pre-Op Diagnosis:   Valgus-impacted left femoral neck fracture.  Post-Op Diagnosis:   Same.  Procedure:   In situ cannulated screw fixation of valgus-impacted left femoral neck fracture.  Surgeon:   Lonnie Roberts, MD  Assistant:   [place] Lee Napolitano, PA-S  Anesthesia:   GET  Findings:   As above.  Complications:   None  EBL:   50 cc  Fluids:   500 cc crystalloid  UOP:   Incontinent  TT:   None  Drains:   None  Closure:   Staples  Implants:   Biomet 6.5 mm cannulated screws (16 mm) 3  Brief Clinical Note:   The patient is a 68 year old female who sustained above-noted injury last evening when she tripped and fell on a curb while going into a [employer]. The patient presented to the emergency room where x-rays demonstrated the above-noted findings. The patient has been cleared medically and presents at this time for definitive management of this injury.  Procedure:   The patient was brought into the operating room and lain in the supine position. After adequate general endotracheal intubation and anesthesia was obtained, the patient was repositioned on the fracture table. The uninjured leg was placed in a flexed and abducted position over the well-leg holder while the operative leg was placed in gentle longitudinal traction with some internal rotation. The adequacy of fracture position was verified fluoroscopically in AP and lateral projections before the lateral aspect of the left hip and thigh were prepped with ChloraPrep solution and draped sterilely. Preoperative antibiotics were administered. A timeout was performed to verify the appropriate surgical site.   An approximately 3-4 cm incision was made over the lateral aspect of the lower part of the greater trochanter as verified fluoroscopically. This incision was carried down through the subcutaneous cutaneous tissues to expose the iliotibial band. This was split the  length the incision to expose the lateral aspect of the proximal femur at the level of the inferior most part of the greater trochanter. Under fluoroscopic guidance, a guide wire was drilled up through the femoral neck along the calcar into the femoral head to rest within 5 mm of subchondral bone. Its position was assessed fluoroscopically in AP and lateral projections and found to be excellent. Two additional guide wires were placed in parallel fashion more proximally, anterior and posterior to the original pin in an inverted triangular configuration. Again the position of these pins was verified fluoroscopically in AP, lateral, and oblique projections and found to be excellent. The length of each of these pins was measured before each pin was overdrilled with the appropriate cannulated drill. Each of these screws was inserted and advanced to within 8-10 mm of subchondral bone. Again the position of each of these screws was assessed fluoroscopically in AP, lateral, and oblique projections, and found to be excellent.  The wound was copiously irrigated with sterile saline solution before the IT band was reapproximated using #0 Vicryl interrupted sutures. The subcutaneous tissues also were closed using 2-0 Vicryl interrupted sutures before the skin was closed using staples. A total of 30 cc of 0.5% Sensorcaine  with epinephrine  was injected in and around the surgical incision before a sterile occlusive dressing was applied to the wound. The patient was awakened, extubated, and returned to the recovery room in satisfactory condition after tolerating the procedure well.

## 2023-10-16 NOTE — Transfer of Care (Signed)
 Immediate Anesthesia Transfer of Care Note  Patient: Stacy Murillo [place]  Procedure(s) Performed: FIXATION, FEMUR, NECK, PERCUTANEOUS, USING SCREW (Left: Hip)  Patient Location: PACU  Anesthesia Type:General  Level of Consciousness: drowsy and patient cooperative  Airway & Oxygen Therapy: Patient Spontanous Breathing and Patient connected to face mask oxygen  Post-op Assessment: Report given to RN and Post -op Vital signs reviewed and stable  Post vital signs: Reviewed and stable  Last Vitals:  Vitals Value Taken Time  BP 111/86 10/16/23 1840  Temp    Pulse 96 10/16/23 1840  Resp 19 10/16/23 1840  SpO2 100 % 10/16/23 1840  Vitals shown include unfiled device data.  Last Pain:  Vitals:   10/16/23 1428  TempSrc: Temporal  PainSc: 0-No pain         Complications: No notable events documented.

## 2023-10-16 NOTE — Anesthesia Preprocedure Evaluation (Addendum)
 Anesthesia Evaluation  Patient identified by MRN, date of birth, ID band Patient awake    Reviewed: Allergy & Precautions, NPO status , Patient's Chart, lab work & pertinent test results  Airway Mallampati: II  TM Distance: >3 FB Neck ROM: full    Dental  (+) Teeth Intact, Dental Advidsory Given   Pulmonary neg pulmonary ROS, shortness of breath   Pulmonary exam normal breath sounds clear to auscultation       Cardiovascular Exercise Tolerance: Good negative cardio ROS Normal cardiovascular exam Rhythm:Regular Rate:Normal     Neuro/Psych  PSYCHIATRIC DISORDERS Anxiety    Dementia negative neurological ROS  negative psych ROS   GI/Hepatic negative GI ROS, Neg liver ROS,GERD  Medicated,,  Endo/Other  negative endocrine ROS    Renal/GU Renal disease     Musculoskeletal   Abdominal   Peds  Hematology negative hematology ROS (+) Thrombocytopenia   Anesthesia Other Findings Past Medical History: No date: Arthritis No date: Cancer (HCC) 03/13/2022: Cognitive impairment 07/06/2015: Diverticulosis No date: Dysrhythmia     Comment:  tachycardia 07/06/2015: Esophagitis No date: Fatigue 07/06/2015: Gastritis No date: GERD (gastroesophageal reflux disease) 07/06/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)  Past Surgical History: 2005: ABDOMINAL HYSTERECTOMY 09/14/15: BONE MARROW BIOPSY 1997: CHOLECYSTECTOMY 07/05/2014: COLONOSCOPY 07/05/2014: ESOPHAGOGASTRODUODENOSCOPY 10/05/2015: INGUINAL LYMPH NODE BIOPSY; Right     Comment:  Procedure: INGUINAL LYMPH NODE BIOPSY;  Surgeon:                Jerlean Mood, MD;  Location: [facility] ORS;  Service:              General;  Laterality: Right; 09/27/2015: PERIPHERAL VASCULAR CATHETERIZATION; N/A     Comment:  Procedure: Porta Cath Insertion;  Surgeon: Celso College,               MD;  Location: [facility] INVASIVE CV LAB;  Service:               Cardiovascular;  Laterality: N/A; 06/03/2017: PORTA CATH REMOVAL; Right     Comment:  Procedure: PORTA CATH REMOVAL, with venous thrombectomy;              Surgeon: Celso College, MD;  Location: [facility] INVASIVE CV               LAB;  Service: Cardiovascular;  Laterality: Right; No date: PORTACATH PLACEMENT; Right  BMI    Body Mass Index: 37.58 kg/m      Reproductive/Obstetrics negative OB ROS                             Anesthesia Physical Anesthesia Plan  ASA: 3  Anesthesia Plan: General ETT   Post-op Pain Management:    Induction: Intravenous  PONV Risk Score and Plan: 3 and Ondansetron  and Dexamethasone   Airway Management Planned: Oral ETT  Additional Equipment:   Intra-op Plan:   Post-operative Plan: Extubation in OR  Informed Consent: I have reviewed the patients History and Physical, chart, labs and discussed the procedure including the risks, benefits and alternatives for the proposed anesthesia with the patient or authorized representative who has indicated his/her understanding and  acceptance.     Dental Advisory Given and Consent reviewed with POA  Plan Discussed with: Anesthesiologist, CRNA and Surgeon  Anesthesia Plan Comments: (Patient's husband consented for risks of anesthesia including but not limited to:  - adverse reactions to medications - damage to eyes, teeth, lips or other oral mucosa - nerve damage due to positioning  - sore throat or hoarseness - Damage to heart, brain, nerves, lungs, other parts of body or loss of life  Patient and husband voiced understanding and assent.)       Anesthesia Quick Evaluation

## 2023-10-16 NOTE — TOC Progression Note (Signed)
 Transition of Care [facility]) - Progression Note    Patient Details  Name: Jersi B [place] MRN: [id number] Date of Birth: 1955/10/01  Transition of Care [facility] CM/SW Contact  Alexandra Ice, RN Phone Number: 10/16/2023, 1:03 PM  Clinical Narrative:     Received call from Burdette Carolin with [facility], patient is resident at facility. Will keep updated on patient's discharge plan.        Expected Discharge Plan and Services                                               Social Determinants of Health (SDOH) Interventions SDOH Screenings   Food Insecurity: No Food Insecurity (10/15/2023)  Housing: Unknown (10/16/2023)  Transportation Needs: No Transportation Needs (10/15/2023)  Utilities: Not At Risk (10/15/2023)  Alcohol Screen: Low Risk  (07/08/2023)  Depression (PHQ2-9): Low Risk  (08/13/2023)  Financial Resource Strain: Low Risk  (07/08/2023)  Physical Activity: Inactive (07/08/2023)  Social Connections: Moderately Isolated (10/15/2023)  Stress: No Stress Concern Present (07/08/2023)  Tobacco Use: Low Risk  (10/16/2023)  Health Literacy: Inadequate Health Literacy (07/08/2023)    Readmission Risk Interventions     No data to display

## 2023-10-16 NOTE — Progress Notes (Signed)
 Progress Note   Patient: Stacy Murillo [place] ZOX:[id number] DOB: [birth year] DOA: 10/15/2023     1 DOS: the patient was seen and examined on 10/16/2023   Brief hospital course:  Stacy Murillo is a 68 y.o. 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   [name] states she and her husband were on the way to dinner, and he was helping her out of the car.  When she went to take her first step onto the ramp, her foot hit the curb and she fell onto her left side.  Due to severe pain and inability to bear weight since then, EMS was called.  No loss of consciousness or head trauma noted.  [name] states that he was holding onto her when she fell and so she did not hit the ground very hard.   ED course: On arrival to the ED, patient is hypertensive at 153/85 with a heart rate of 72.  She was saturating at 100% on room air.  She was afebrile at 97.6.  Initial workup notable for unremarkable CBC, creatinine 1.40 with GFR 41.  Chest x-ray with no active disease.  Hip x-ray with acute nondisplaced fracture through the left femoral neck.  Orthopedic surgery consulted with plans for the OR tomorrow.  TRH contacted for admission.     Assessment and Plan:  Femur fracture [facility]) Patient presents for evaluation of left hip pain following a fall and imaging shows left, nondisplaced femoral neck fracture after a mechanical ground-level fall.   Given low impact, likely secondary to osteoporosis. - Pain control with Tylenol , oxycodone , and Dilaudid  - Nonweightbearing - Continuous pulse oximetry while receiving IV opioids - Postop PT/OT per orthopedic surgery   CKD (chronic kidney disease), stage III (HCC) History of CKD stage III with baseline creatinine ranging between 1.1 and 1.4, currently at baseline.   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  She is currently at baseline. - Delirium precautions - Safety sitter since patient remains confused and not easily directable.   - She is at increased risk for falls   DLBCL (diffuse large Murillo cell lymphoma) (HCC) History of recurrent diffuse large Murillo-cell lymphoma, currently under surveillance only.   - Continue outpatient follow-up with oncology     Subjective: No new complaints.  Husband at the bedside  Physical Exam: Vitals:   10/15/23 2224 10/16/23 0028 10/16/23 0315 10/16/23 0726  BP: (!) 154/65  (!) 152/82 (!) 165/80  Pulse: 71  83 70  Resp: 17  18 16   Temp: 97.8 F (36.6 C)  98 F (36.7 C) 98.7 F (37.1 C)  TempSrc:   Oral Oral  SpO2: 100%  99% 98%  Weight:  93.2 kg    Height:  5\' 2"  (1.575 m)      Vitals and nursing note reviewed.  Constitutional:      General: She is not in acute distress.    Appearance: She is normal weight. She is not toxic-appearing.  HENT:     Head: Normocephalic and atraumatic.     Mouth/Throat:     Mouth: Mucous membranes are moist.     Pharynx: Oropharynx is clear.  Cardiovascular:  Rate and Rhythm: Normal rate and regular rhythm.     Pulses:          Dorsalis pedis pulses are 2+ on the right side and 2+ on the left side.     Heart sounds: No murmur heard.    No gallop.  Pulmonary:     Effort: Pulmonary effort is normal. No respiratory distress.     Breath sounds: Normal breath sounds.  Musculoskeletal:     Right lower leg: No edema.     Left lower leg: No edema.  Skin:    General: Skin is warm and dry.  Neurological:     Mental Status: She is alert.     Comments:  Patient is alert and oriented, however unclear on details regarding her medical history.  Known history of cognitive decline.  Psychiatric:        Mood  and Affect: Mood normal.        Behavior: Behavior normal.    Data Reviewed:  Labs reviewed.  Family Communication: Plan of care discussed with patient's husband at the bedside.  Patient will likely need a safety sitter when her husband leaves  Disposition: Status is: Inpatient Remains inpatient appropriate because: Needs surgery for left femoral neck fracture  Planned Discharge Destination: TBD    Time spent: 35 minutes  Author: Read Camel, MD 10/16/2023 12:59 PM  For on call review [email or web address].

## 2023-10-16 NOTE — Plan of Care (Signed)
   Problem: Nutrition: Goal: Adequate nutrition will be maintained Outcome: Progressing   Problem: Coping: Goal: Level of anxiety will decrease Outcome: Progressing   Problem: Pain Managment: Goal: General experience of comfort will improve and/or be controlled Outcome: Progressing

## 2023-10-16 NOTE — Anesthesia Procedure Notes (Signed)
 Procedure Name: Intubation Date/Time: 10/16/2023 5:18 PM  Performed by: Izell Marsh, CRNAPre-anesthesia Checklist: Patient identified, Patient being monitored, Timeout performed, Emergency Drugs available and Suction available Patient Re-evaluated:Patient Re-evaluated prior to induction Oxygen Delivery Method: Circle system utilized Preoxygenation: Pre-oxygenation with 100% oxygen Induction Type: IV induction Ventilation: Mask ventilation without difficulty Laryngoscope Size: 3 and McGrath Grade View: Grade I Tube type: Oral Tube size: 6.5 mm Number of attempts: 1 Airway Equipment and Method: Stylet Placement Confirmation: ETT inserted through vocal cords under direct vision, positive ETCO2 and breath sounds checked- equal and bilateral Secured at: 21 cm Tube secured with: Tape Dental Injury: Teeth and Oropharynx as per pre-operative assessment

## 2023-10-17 ENCOUNTER — Encounter: Payer: Self-pay | Admitting: Surgery

## 2023-10-17 DIAGNOSIS — S72002A Fracture of unspecified part of neck of left femur, initial encounter for closed fracture: Secondary | ICD-10-CM | POA: Diagnosis not present

## 2023-10-17 LAB — BASIC METABOLIC PANEL WITH GFR
Anion gap: 12 (ref 5–15)
BUN: 16 mg/dL (ref 8–23)
CO2: 22 mmol/L (ref 22–32)
Calcium: 9 mg/dL (ref 8.9–10.3)
Chloride: 103 mmol/L (ref 98–111)
Creatinine, Ser: 1.34 mg/dL — ABNORMAL HIGH (ref 0.44–1.00)
GFR, Estimated: 43 mL/min — ABNORMAL LOW (ref >60)
Glucose, Bld: 134 mg/dL — ABNORMAL HIGH (ref 70–99)
Potassium: 4 mmol/L (ref 3.5–5.1)
Sodium: 137 mmol/L (ref 135–145)

## 2023-10-17 LAB — CBC
HCT: 42.8 % (ref 36.0–46.0)
Hemoglobin: 14.2 g/dL (ref 12.0–15.0)
MCH: 29.8 pg (ref 26.0–34.0)
MCHC: 33.2 g/dL (ref 30.0–36.0)
MCV: 89.9 fL (ref 80.0–100.0)
Platelets: 122 10*3/uL — ABNORMAL LOW (ref 150–400)
RBC: 4.76 MIL/uL (ref 3.87–5.11)
RDW: 12.6 % (ref 11.5–15.5)
WBC: 8.9 10*3/uL (ref 4.0–10.5)
nRBC: 0 % (ref 0.0–0.2)

## 2023-10-17 MED ORDER — OXYCODONE HCL 5 MG PO TABS: 2.5000 mg | ORAL_TABLET | Freq: Four times a day (QID) | ORAL | 0 refills | Status: DC | PRN

## 2023-10-17 MED ORDER — ENOXAPARIN SODIUM 40 MG/0.4ML IJ SOSY: 40.0000 mg | PREFILLED_SYRINGE | INTRAMUSCULAR | 0 refills | Status: DC

## 2023-10-17 NOTE — Progress Notes (Signed)
 Progress Note   Patient: Stacy Murillo [place] ZOX:[id number] DOB: 1955-06-30 DOA: 10/15/2023     2 DOS: the patient was seen and examined on 10/17/2023   Brief hospital course:  Stacy Murillo [place] is a 68 y.o. 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   Stacy Murillo states she and her husband were on the way to dinner, and he was helping her out of the car.  When she went to take her first step onto the ramp, her foot hit the curb and she fell onto her left side.  Due to severe pain and inability to bear weight since then, EMS was called.  No loss of consciousness or head trauma noted.  [name] states that he was holding onto her when she fell and so she did not hit the ground very hard.   ED course: On arrival to the ED, patient is hypertensive at 153/85 with a heart rate of 72.  She was saturating at 100% on room air.  She was afebrile at 97.6.  Initial workup notable for unremarkable CBC, creatinine 1.40 with GFR 41.  Chest x-ray with no active disease.  Hip x-ray with acute nondisplaced fracture through the left femoral neck.  Orthopedic surgery consulted with plans for the OR tomorrow.  TRH contacted for admission.      Assessment and Plan:  Femur fracture [facility]) Patient presents for evaluation of left hip pain following a fall and imaging showed left, nondisplaced femoral neck fracture after a mechanical ground-level fall.   Given low impact, likely secondary to osteoporosis. - Pain control with Tylenol , oxycodone , and Dilaudid  - Nonweightbearing - Continuous pulse oximetry while receiving IV opioids - Patient is status post percutaneous fixation of left femur fracture (POD 1) - PT evaluation    CKD (chronic kidney disease), stage III (HCC) History of CKD stage III with baseline creatinine ranging between 1.1  and 1.4, currently at baseline.    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  She is currently at baseline. - Delirium precautions - Safety sitter since patient remains confused and not easily directable.   - She is at increased risk for falls   DLBCL (diffuse large Murillo cell lymphoma) (HCC) History of recurrent diffuse large Murillo-cell lymphoma, currently under surveillance only.   - Continue outpatient follow-up with oncology       Subjective: No events overnight.  Husband and daughter at the bedside  Physical Exam: Vitals:   10/17/23 0302 10/17/23 0755 10/17/23 0900 10/17/23 1203  BP: (!) 151/104 (!) 130/113 (!) 129/102 (!) 143/72  Pulse: 80 88  67  Resp: 19 18  16   Temp: 98.1 F (36.7 C) 98.3 F (36.8 C)  98.1 F (36.7 C)  TempSrc:  Oral  Oral  SpO2: 99% 97%  100%  Weight:      Height:       Constitutional:      General: She is not in acute distress.    Appearance: She is normal weight. She is not toxic-appearing.  HENT:     Head: Normocephalic and atraumatic.     Mouth/Throat:     Mouth: Mucous membranes are moist.  Pharynx: Oropharynx is clear.  Cardiovascular:     Rate and Rhythm: Normal rate and regular rhythm.     Pulses:          Dorsalis pedis pulses are 2+ on the right side and 2+ on the left side.     Heart sounds: No murmur heard.    No gallop.  Pulmonary:     Effort: Pulmonary effort is normal. No respiratory distress.     Breath sounds: Normal breath sounds.  Musculoskeletal:     Right lower leg: No edema.     Left lower leg: No edema.  Skin:    General: Skin is warm and dry.  Neurological:     Mental Status: She is alert.     Comments:  Patient is alert and oriented, however unclear on details  regarding her medical history.  Known history of cognitive decline.  Psychiatric:        Mood and Affect: Mood normal.        Behavior: Behavior normal.      Data Reviewed: Serum 1.34 Labs reviewed  Family Communication: Plan of care was discussed with patient's husband and daughter at the bedside.  All questions and concerns have been addressed they verbalized understanding and agreed with the plan.  Disposition: Status is: Inpatient Remains inpatient appropriate because: Discharge disposition.  Planned Discharge Destination: TBD    Time spent: 35 minutes  Author: Read Camel, MD 10/17/2023 12:35 PM  For on call review [email or web address].

## 2023-10-17 NOTE — Progress Notes (Signed)
 Subjective: 1 Day Post-Op Procedure(s) (LRB): FIXATION, FEMUR, NECK, PERCUTANEOUS, USING SCREW (Left) Patient reports pain as mild.   Patient is well, and has had no acute complaints or problems PT and care management to assist with discharge planning. Negative for chest pain and shortness of breath Fever: no Gastrointestinal:Negative for nausea and vomiting, reports she is passing some gas this AM.  Objective: Vital signs in last 24 hours: Temp:  [97.3 F (36.3 C)-98.7 F (37.1 C)] 98.1 F (36.7 C) (06/05 0302) Pulse Rate:  [70-96] 80 (06/05 0302) Resp:  [14-20] 19 (06/05 0302) BP: (111-165)/(67-104) 151/104 (06/05 0302) SpO2:  [93 %-100 %] 99 % (06/05 0302)  Intake/Output from previous day:  Intake/Output Summary (Last 24 hours) at 10/17/2023 0719 Last data filed at 10/17/2023 0534 Gross per 24 hour  Intake 700 ml  Output 350 ml  Net 350 ml    Intake/Output this shift: No intake/output data recorded.  Labs: Recent Labs    10/15/23 1951  HGB 13.8   Recent Labs    10/15/23 1951  WBC 9.5  RBC 4.58  HCT 41.0  PLT 165   Recent Labs    10/15/23 1951 10/16/23 0315  NA 139 138  K 3.9 4.0  CL 104 102  CO2 24 25  BUN 18 17  CREATININE 1.40* 1.41*  GLUCOSE 102* 129*  CALCIUM  9.9 9.7   Recent Labs    10/15/23 1951  INR 0.9     EXAM General - Patient is Alert and Confused.  Safety mittens in place, h/o cognitive impairment. Extremity - ABD soft Neurovascular intact Dorsiflexion/Plantar flexion intact Incision: dressing C/D/I No cellulitis present Compartment soft Dressing/Incision - clean, dry, no drainage noted to the left hip honeycomb dressing. Motor Function - intact, moving foot and toes well on exam.  Abdomen soft with intact bowel sounds.  Past Medical History:  Diagnosis Date   Arthritis    Cancer [facility])    Cognitive impairment 03/13/2022   Diverticulosis 07/06/2015   Dysrhythmia    tachycardia   Esophagitis 07/06/2015   Fatigue     Gastritis 07/06/2015   GERD (gastroesophageal reflux disease)    Hypopharyngeal lesion 07/06/2015   Jugular vein occlusion, 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   Leg cramps    Lymphoma (HCC) 09/14/2015   Monoclonal B cell lymphoma   Night sweats    Osteoporosis    Overweight(278.02)    Obesity   Palpitations    Shortness of breath dyspnea    Tachycardia    Thrombocytopenia (HCC)     Assessment/Plan: 1 Day Post-Op Procedure(s) (LRB): FIXATION, FEMUR, NECK, PERCUTANEOUS, USING SCREW (Left) Principal Problem:   Femur fracture (HCC) Active Problems:   DLBCL (diffuse large B cell lymphoma) (HCC)   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   Weight as of this encounter: 93.2 kg. Advance diet Up with therapy D/C IV fluids when tolerating po intake.  Vitals reviewed this AM. Up with PT.  Patient currently lives at home, may need SNF at discharge. Continue to work on a BM.  Following discharge, continue Lovenox  40mg  daily for 14 days. Follow-up with [facility] in 10-14 days for staple removal and x-rays of the left hip.  DVT Prophylaxis - Lovenox  and TED hose TTWB to the left leg.  Antoine Bathe, PA-C [facility] 10/17/2023, 7:19 AM

## 2023-10-17 NOTE — Progress Notes (Signed)
 Made Dr. Meyer Ada aware that patient's BP was 138/122, MD asked RN to check manual BP. RN made MD aware that manual BP was 140/60. RN also made MD aware that patient was bladder scanned and result 450 cc. MD acknowledged all and gave order to in and out cath once and to bladder scan qshift.

## 2023-10-17 NOTE — Progress Notes (Signed)
 Unit staff able to establish PIV access.

## 2023-10-17 NOTE — [place] FL2 (Signed)
 [name]  MEDICAID FL2 LEVEL OF CARE FORM     IDENTIFICATION  Patient Name: Stacy Murillo Birthdate: 04-19-1956 Sex: female Admission Date (Current Location): 10/15/2023  [facility] and [place] Number:  Chiropodist and Address:  [facility], [address] [place], [place] [address]      Provider Number: [id number]  Attending Physician Name and Address:  Read Camel, MD  Relative Name and Phone Number:  Spouse: Joseph [place] Murillo [phone]    Current Level of Care: Hospital Recommended Level of Care: Skilled Nursing Facility Prior Approval Number:    Date Approved/Denied:   PASRR Number: [id number]  Discharge Plan: SNF    Current Diagnoses: Patient Active Problem List   Diagnosis Date Noted   Femur fracture (HCC) 10/15/2023   CKD (chronic kidney disease), stage III (HCC) 10/15/2023   Seborrheic eczema of scalp 08/13/2023   Port-A-Cath in place 05/17/2022   Generalized weakness    Altered mental status 02/25/2022   Fall at home, initial encounter 02/25/2022   Gait instability 02/25/2022   Dehydration 02/25/2022   COVID-19 05/09/2021   Hypotension, chronic 03/25/2021   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   Primary CNS lymphoma 03/08/2019   Goals of care, counseling/discussion 03/08/2019   Lesion of frontal lobe of brain 02/19/2019   Weight gain, abnormal 12/23/2018   Generalized anxiety disorder 02/09/2018   History of autologous stem cell transplant (HCC) 01/15/2018   Immunization due 01/15/2018   Vaginal dryness, menopausal 12/18/2017   Stem cell transplant candidate 05/30/2017   Diverticulitis large intestine w/o perforation or abscess w/bleeding 02/17/2017   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   DVT (deep venous thrombosis) (HCC) 10/27/2015   Generalized headaches 10/27/2015   Oral mucositis due to  antineoplastic therapy 10/14/2015   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   Left knee pain 07/20/2014   Esophageal reflux 04/06/2014   Special screening for malignant neoplasms, colon 04/06/2014   Screening for breast cancer 07/10/2011   Osteopenia 07/06/2011   Vitamin D  deficiency 07/06/2011   Hyperlipidemia 07/06/2011   Disorder of bone and cartilage 07/06/2011   Osteoporosis    Overweight 10/19/2008    Orientation RESPIRATION BLADDER Height & Weight     Self, Time  Normal Incontinent Weight: 93.2 kg Height:  5\' 2"  (157.5 cm)  BEHAVIORAL SYMPTOMS/MOOD NEUROLOGICAL BOWEL NUTRITION STATUS      Incontinent Diet (Heart healthy, thin liquids)  AMBULATORY STATUS COMMUNICATION OF NEEDS Skin   Limited Assist Verbally Surgical wounds                       Personal Care Assistance Level of Assistance  Bathing, Feeding, Dressing Bathing Assistance: Limited assistance Feeding assistance: Limited assistance Dressing Assistance: Limited assistance     Functional Limitations Info             SPECIAL CARE FACTORS FREQUENCY  PT (By licensed PT), OT (By licensed OT)     PT Frequency: 5 times per week OT Frequency: 5 times per week            Contractures Contractures Info: Not present    Additional Factors Info  Code Status, Allergies Code Status Info: Full Code Allergies Info: Ondansetron , Oxycodone , RItuximab            Current Medications (10/17/2023):  This is the current hospital active medication list Current Facility-Administered  Medications  Medication Dose Route Frequency Provider Last Rate Last Admin   0.9 %  sodium chloride  infusion   Intravenous Continuous Poggi, John J, MD 75 mL/hr at 10/17/23 1500 Infusion Verify at 10/17/23 1500   acetaminophen  (TYLENOL ) tablet 1,000 mg  1,000 mg Oral Q6H Poggi, John J, MD   1,000 mg at 10/17/23 1155   acetaminophen  (TYLENOL ) tablet 325-650 mg  325-650 mg Oral Q6H PRN Poggi, John J, MD        bisacodyl  (DULCOLAX) suppository 10 mg  10 mg Rectal Daily PRN Poggi, John J, MD       busPIRone  (BUSPAR ) tablet 15 mg  15 mg Oral TID Poggi, John J, MD   15 mg at 10/17/23 1013   diphenhydrAMINE  (BENADRYL ) 12.5 MG/5ML elixir 12.5-25 mg  12.5-25 mg Oral Q4H PRN Poggi, John J, MD       docusate sodium  (COLACE) capsule 100 mg  100 mg Oral BID Poggi, John J, MD   100 mg at 10/17/23 1013   enoxaparin  (LOVENOX ) injection 40 mg  40 mg Subcutaneous Q24H Poggi, John J, MD   40 mg at 10/17/23 1014   fesoterodine  (TOVIAZ ) tablet 4 mg  4 mg Oral Daily Poggi, Kaylene Pascal, MD   4 mg at 10/17/23 1018   HYDROmorphone  (DILAUDID ) injection 0.5 mg  0.5 mg Intravenous Q3H PRN Poggi, John J, MD   0.5 mg at 10/16/23 0059   ketorolac  (TORADOL ) 15 MG/ML injection 7.5 mg  7.5 mg Intravenous Q6H Poggi, Kaylene Pascal, MD   7.5 mg at 10/17/23 1157   magnesium  hydroxide (MILK OF MAGNESIA) suspension 30 mL  30 mL Oral Daily PRN Poggi, Kaylene Pascal, MD       metoCLOPramide  (REGLAN ) tablet 5-10 mg  5-10 mg Oral Q8H PRN Poggi, John J, MD       Or   metoCLOPramide  (REGLAN ) injection 5-10 mg  5-10 mg Intravenous Q8H PRN Poggi, John J, MD       ondansetron  (ZOFRAN ) tablet 4 mg  4 mg Oral Q6H PRN Poggi, John J, MD       Or   ondansetron  (ZOFRAN ) injection 4 mg  4 mg Intravenous Q6H PRN Poggi, John J, MD       oxyCODONE  (Oxy IR/ROXICODONE ) immediate release tablet 5 mg  5 mg Oral Q6H PRN Poggi, John J, MD       sodium phosphate  (FLEET) enema 1 enema  1 enema Rectal Once PRN Poggi, John J, MD       Facility-Administered Medications Ordered in Other Encounters  Medication Dose Route Frequency Provider Last Rate Last Admin   0.9 %  sodium chloride  infusion   Intravenous Continuous Herring, Verner Goltz, NP   Stopped at 10/14/15 1312   methotrexate  (50 mg/ml) 6.3 g in sodium chloride  0.9 % 1,000 mL injection   Intravenous Once Brahmanday, Govinda R, MD       sodium chloride  flush (NS) 0.9 % injection 10 mL  10 mL Intravenous PRN Brahmanday, Govinda R,  MD   10 mL at 10/05/16 1400     Discharge Medications: Please see discharge summary for a list of discharge medications.  Relevant Imaging Results:  Relevant Lab Results:   Additional Information SSN:[id number]  Alexandra Ice, RN

## 2023-10-17 NOTE — Evaluation (Signed)
 Physical Therapy Evaluation Patient Details Name: Stacy Murillo [place] MRN: [id number] DOB: [birth year] Today's Date: 10/17/2023  History of Present Illness  68 y/o female presented to ED on 10/15/23 after fall. Sustained acute nondisplaced fracture through the L femoral neck. S/p L femoral neck screw fixation on 6/4. 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 PTA, patient lives with husband and was ambulatory while holding husband's arm and required assistance for ADLs and iADLs due to cognition. Patient and husband on waiting list for [facility]. Attempted education to patient about weightbearing status, however due to cognitive deficits, poor recall and understanding for mobility requiring max multimodal cueing to maintain. Required totalA+2 for bed mobility and maxA+2 to stand from EOB with therapist foot under LLE to gauge amount of weightbearing. On first attempt, weightbearing ~25% through LLE, however on second attempt, patient able to keep LLE off ground. Only stood ~3 seconds each attempt prior to sitting. Difficulty following commands despite max multimodal cueing. Patient will benefit from skilled PT services during acute stay to address listed deficits. Patient will benefit from ongoing therapy at discharge to maximize functional independence and safety.         If plan is discharge home, recommend the following: Two people to help with walking and/or transfers;Two people to help with bathing/dressing/bathroom;Assistance with cooking/housework;Direct supervision/assist for financial management;Direct supervision/assist for medications management;Assist for transportation;Help with stairs or ramp for entrance;Supervision due to cognitive status   Can travel by private vehicle   No    Equipment Recommendations Other (comment) (TBD)   Recommendations for Other Services       Functional Status Assessment Patient has had a recent decline in their functional status and demonstrates the ability to make significant improvements in function in a reasonable and predictable amount of time.     Precautions / Restrictions Precautions Precautions: Fall Recall of Precautions/Restrictions: Impaired Restrictions Weight Bearing Restrictions Per Provider Order: Yes LLE Weight Bearing Per Provider Order: Touchdown weight bearing      Mobility  Bed Mobility Overal bed mobility: Needs Assistance Bed Mobility: Supine to Sit, Sit to Supine     Supine to sit: Total assist, +2 for physical assistance, +2 for safety/equipment Sit to supine: Total assist, +2 for safety/equipment, +2 for physical assistance        Transfers Overall transfer level: Needs assistance Equipment used: Rolling Chara Marquard (2 wheels) Transfers: Sit to/from Stand Sit to Stand: Max assist, +2 safety/equipment, +2 physical assistance, From elevated surface           General transfer comment: placed patient's L foot on therapist's foot to gauge amount of weightbearing. Able to stand initially with ~25% WB through L LE. On second attempt, able to keep L LE off ground. Only stands briefly ~3 seconds prior to sitting. Difficulty sequencing requiring max multimodal cueing. Use of bed pad to assist into standing due to sensitivity of skin (per husband, this is not new)    Ambulation/Gait                  Stairs            Wheelchair Mobility     Tilt Bed    Modified Rankin (Stroke Patients Only)       Balance Overall balance assessment: Needs assistance Sitting-balance support: Bilateral upper extremity supported, Feet supported Sitting balance-Leahy Scale: Poor Sitting balance - Comments: R lateral  lean to offweight L hip Postural control: Right lateral lean Standing balance support: Bilateral upper extremity supported, During  functional activity                                 Pertinent Vitals/Pain Pain Assessment Pain Assessment: Faces Faces Pain Scale: Hurts little more Pain Location: L hip Pain Descriptors / Indicators: Grimacing, Guarding Pain Intervention(s): Limited activity within patient's tolerance, Monitored during session, Repositioned    Home Living Family/patient expects to be discharged to:: Skilled nursing facility Living Arrangements: Spouse/significant other                      Prior Function Prior Level of Function : Needs assist;History of Falls (last six months)             Mobility Comments: holds onto husband's arm for ambulation. Recurrent falls. Husband reports difficulty sequencing ambulation which is likely a result from chemo for lymphoma in brain ADLs Comments: requires assistance from husband for ADLs and iADLs     Extremity/Trunk Assessment   Upper Extremity Assessment Upper Extremity Assessment: Defer to OT evaluation    Lower Extremity Assessment Lower Extremity Assessment: Generalized weakness;LLE deficits/detail LLE Deficits / Details: deficits consistent with post op pain and weakness - difficult to assess due to sensitivity to touch all over body       Communication   Communication Communication: No apparent difficulties    Cognition Arousal: Alert Behavior During Therapy: WFL for tasks assessed/performed   PT - Cognitive impairments: History of cognitive impairments                       PT - Cognition Comments: difficulty following commands and sequencing tasks Following commands: Impaired Following commands impaired: Follows one step commands with increased time, Follows one step commands inconsistently     Cueing Cueing Techniques: Verbal cues, Gestural cues, Tactile cues, Visual cues     General Comments      Exercises     Assessment/Plan    PT Assessment Patient needs continued PT services  PT  Problem List Decreased strength;Decreased activity tolerance;Decreased range of motion;Decreased balance;Decreased mobility;Decreased cognition;Decreased knowledge of use of DME;Decreased knowledge of precautions;Decreased safety awareness;Pain       PT Treatment Interventions DME instruction;Gait training;Functional mobility training;Therapeutic activities;Therapeutic exercise;Neuromuscular re-education;Balance training;Patient/family education    PT Goals (Current goals can be found in the Care Plan section)  Acute Rehab PT Goals Patient Stated Goal: did not state PT Goal Formulation: With family Time For Goal Achievement: 10/31/23 Potential to Achieve Goals: Fair    Frequency Min 3X/week     Co-evaluation PT/OT/SLP Co-Evaluation/Treatment: Yes Reason for Co-Treatment: For patient/therapist safety;To address functional/ADL transfers PT goals addressed during session: Proper use of DME;Mobility/safety with mobility         AM-PAC PT "6 Clicks" Mobility  Outcome Measure Help needed turning from your back to your side while in a flat bed without using bedrails?: Total Help needed moving from lying on your back to sitting on the side of a flat bed without using bedrails?: Total Help needed moving to and from a bed to a chair (including a wheelchair)?: Total Help needed standing up from a chair using your arms (e.g., wheelchair or bedside chair)?: Total Help needed to walk in hospital room?: Total Help needed climbing 3-5 steps with a railing? : Total 6 Click Score: 6  End of Session   Activity Tolerance: Patient tolerated treatment well Patient left: in bed;with call bell/phone within reach;with bed alarm set;with family/visitor present;with restraints reapplied Nurse Communication: Mobility status PT Visit Diagnosis: Unsteadiness on feet (R26.81);Muscle weakness (generalized) (M62.81);Other abnormalities of gait and mobility (R26.89);History of falling (Z91.81);Difficulty in  walking, not elsewhere classified (R26.2)    Time: 4098-1191 PT Time Calculation (min) (ACUTE ONLY): 31 min   Charges:   PT Evaluation $PT Eval Moderate Complexity: 1 Mod   PT General Charges $$ ACUTE PT VISIT: 1 Visit         Janine Melbourne, PT, DPT Physical Therapist - [facility]  [facility]   Insiya Oshea A Lynde Ludwig 10/17/2023, 9:52 AM

## 2023-10-17 NOTE — Evaluation (Signed)
 Occupational Therapy Evaluation Patient Details Name: Analisa B [place] MRN: [id number] DOB: 09-04-1955 Today's Date: 10/17/2023   History of Present Illness   68 y/o female presented to ED on 10/15/23 after fall. Sustained acute nondisplaced fracture through the L femoral neck. S/p L femoral neck screw fixation on 6/4. 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 Prior to hospital admission, pt required assist from spouse for t/fs and ADLs. Pt lives with spouse. Cognition limiting sequencing, hands on step by step cueing to follow commands.    Pt currently requires TOTAL A x2 sup<>sit and rolling L+R. MAX A don B socks in sitting, R lateral lean in sitting. MAX A x2 + RW sit<>stand x2 trials, clears rear with TTWB.  Pt would benefit from skilled OT to address noted impairments and functional limitations (see below for any additional details). Upon hospital discharge, recommend OT follow up.     If plan is discharge home, recommend the following:   Two people to help with walking and/or transfers;Two people to help with bathing/dressing/bathroom     Functional Status Assessment   Patient has had a recent decline in their functional status and demonstrates the ability to make significant improvements in function in a reasonable and predictable amount of time.     Equipment Recommendations   Hospital bed     Recommendations for Other Services         Precautions/Restrictions   Precautions Precautions: Fall Recall of Precautions/Restrictions: Impaired Restrictions Weight Bearing Restrictions Per Provider Order: Yes LLE Weight Bearing Per Provider Order: Touchdown weight bearing     Mobility Bed Mobility Overal bed mobility: Needs Assistance Bed Mobility: Supine to Sit, Sit to Supine     Supine to sit: Total  assist, +2 for physical assistance, +2 for safety/equipment Sit to supine: Total assist, +2 for safety/equipment, +2 for physical assistance        Transfers Overall transfer level: Needs assistance Equipment used: Rolling walker (2 wheels) Transfers: Sit to/from Stand Sit to Stand: Max assist, +2 safety/equipment, +2 physical assistance, From elevated surface           General transfer comment: placed patient's L foot on therapist's foot to gauge amount of weightbearing. Able to stand initially with ~25% WB through L LE. On second attempt, able to keep L LE off ground. Only stands briefly ~3 seconds prior to sitting. Difficulty sequencing requiring max multimodal cueing. Use of chux pad to assist into standing due to sensitivity of skin (per husband, this is not new)      Balance Overall balance assessment: Needs assistance Sitting-balance support: Bilateral upper extremity supported, Feet supported Sitting balance-Leahy Scale: Poor Sitting balance - Comments: R lateral lean to offweight L hip Postural control: Right lateral lean Standing balance support: Bilateral upper extremity supported, Reliant on assistive device for balance                               ADL either performed or assessed with clinical judgement   ADL Overall ADL's : Needs assistance/impaired                                       General ADL Comments: MAX A don B socks in  sitting, +2 sitting balance.     Vision         Perception         Praxis         Pertinent Vitals/Pain Pain Assessment Pain Assessment: Faces Faces Pain Scale: Hurts little more Pain Location: L hip Pain Descriptors / Indicators: Grimacing, Guarding Pain Intervention(s): Limited activity within patient's tolerance, Repositioned     Extremity/Trunk Assessment Upper Extremity Assessment Upper Extremity Assessment: Generalized weakness   Lower Extremity Assessment Lower Extremity  Assessment: Generalized weakness LLE Deficits / Details: deficits consistent with post op pain and weakness - difficult to assess due to sensitivity to touch all over body       Communication Communication Communication: No apparent difficulties   Cognition Arousal: Alert Behavior During Therapy: WFL for tasks assessed/performed Cognition: Cognition impaired   Orientation impairments: Time, Situation   Memory impairment (select all impairments): Short-term memory Attention impairment (select first level of impairment): Focused attention Executive functioning impairment (select all impairments): Initiation, Organization, Sequencing, Reasoning, Problem solving                   Following commands: Impaired Following commands impaired: Follows one step commands with increased time     Cueing  General Comments   Cueing Techniques: Verbal cues;Gestural cues;Tactile cues;Visual cues      Exercises     Shoulder Instructions      Home Living Family/patient expects to be discharged to:: Private residence Living Arrangements: Spouse/significant other Available Help at Discharge: Family Type of Home: House Home Access: Stairs to enter     Home Layout: One level                          Prior Functioning/Environment Prior Level of Function : Needs assist;History of Falls (last six months)             Mobility Comments: holds onto husband's arm for ambulation. Recurrent falls. Husband reports difficulty sequencing ambulation which is likely a result from chemo for lymphoma in brain ADLs Comments: requires assistance from husband for ADLs and iADLs    OT Problem List: Decreased strength;Decreased range of motion;Decreased activity tolerance;Impaired balance (sitting and/or standing);Decreased safety awareness   OT Treatment/Interventions: Self-care/ADL training;Therapeutic exercise;Energy conservation;DME and/or AE instruction;Therapeutic activities       OT Goals(Current goals can be found in the care plan section)   Acute Rehab OT Goals Patient Stated Goal: to go to rehab OT Goal Formulation: With patient/family Time For Goal Achievement: 10/31/23 Potential to Achieve Goals: Fair ADL Goals Pt Will Perform Grooming: with set-up;with supervision;sitting Pt Will Perform Lower Body Dressing: with mod assist;sit to/from stand Pt Will Transfer to Toilet: with mod assist;squat pivot transfer;bedside commode   OT Frequency:  Min 2X/week    Co-evaluation PT/OT/SLP Co-Evaluation/Treatment: Yes Reason for Co-Treatment: For patient/therapist safety;To address functional/ADL transfers PT goals addressed during session: Proper use of DME;Mobility/safety with mobility OT goals addressed during session: ADL's and self-care      AM-PAC OT "6 Clicks" Daily Activity     Outcome Measure Help from another person eating meals?: A Little Help from another person taking care of personal grooming?: A Lot Help from another person toileting, which includes using toliet, bedpan, or urinal?: A Lot Help from another person bathing (including washing, rinsing, drying)?: A Lot Help from another person to put on and taking off regular upper body clothing?: A Little Help from another person to put on and  taking off regular lower body clothing?: A Lot 6 Click Score: 14   End of Session Equipment Utilized During Treatment: Rolling walker (2 wheels) Nurse Communication: Mobility status  Activity Tolerance:  (limited by cognition) Patient left: in bed;with call bell/phone within reach;with bed alarm set;with family/visitor present  OT Visit Diagnosis: Other abnormalities of gait and mobility (R26.89);Muscle weakness (generalized) (M62.81)                Time: 2130-8657 OT Time Calculation (min): 31 min Charges:  OT General Charges $OT Visit: 1 Visit OT Evaluation $OT Eval Moderate Complexity: 1 Mod OT Treatments $Self Care/Home Management : 8-22  mins  Gordan Latina, M.S. OTR/L  10/17/23, 10:25 AM  ascom [phone]

## 2023-10-17 NOTE — TOC Progression Note (Signed)
 Transition of Care [facility]) - Progression Note    Patient Details  Name: Stacy Murillo [place] MRN: [id number] Date of Birth: 1956-03-19  Transition of [facility]) CM/SW Contact  Alexandra Ice, RN Phone Number: 10/17/2023, 3:53 PM  Clinical Narrative:      W[facility] Half pending MD signature, and bedsearch completed. Pending bed offers.      Expected Discharge Plan and Services                                               Social Determinants of Health (SDOH) Interventions SDOH Screenings   Food Insecurity: No Food Insecurity (10/15/2023)  Housing: Unknown (10/16/2023)  Transportation Needs: No Transportation Needs (10/15/2023)  Utilities: Not At Risk (10/15/2023)  Alcohol Screen: Low Risk  (07/08/2023)  Depression (PHQ2-9): Low Risk  (08/13/2023)  Financial Resource Strain: Low Risk  (07/08/2023)  Physical Activity: Inactive (07/08/2023)  Social Connections: Moderately Isolated (10/15/2023)  Stress: No Stress Concern Present (07/08/2023)  Tobacco Use: Low Risk  (10/16/2023)  Health Literacy: Inadequate Health Literacy (07/08/2023)    Readmission Risk Interventions     No data to display

## 2023-10-17 NOTE — Discharge Instructions (Signed)
 Diet: As you were doing prior to hospitalization   Shower:  May shower but keep the wounds dry, use an occlusive plastic wrap, NO SOAKING IN TUB.  If the bandage gets wet, change with a clean dry gauze.  Dressing:  You may change your dressing as needed. Change the dressing with sterile gauze dressing.    Activity:  Increase activity slowly as tolerated, but follow the weight bearing instructions below.  No lifting or driving for 6 weeks.  Weight Bearing:   Toe touch weightbearing to the left leg.  To prevent constipation: you may use a stool softener such as -  Colace (over the counter) 100 mg by mouth twice a day  Drink plenty of fluids (prune juice may be helpful) and high fiber foods Miralax  (over the counter) for constipation as needed.    Itching:  If you experience itching with your medications, try taking only a single pain pill, or even half a pain pill at a time.  You may take up to 10 pain pills per day, and you can also use benadryl  over the counter for itching or also to help with sleep.   Precautions:  If you experience chest pain or shortness of breath - call 911 immediately for transfer to the hospital emergency department!!  If you develop a fever greater that 101 F, purulent drainage from wound, increased redness or drainage from wound, or calf pain-Call [facility]                                              Follow- Up Appointment:  Please call for an appointment to be seen in 2 weeks at [facility]

## 2023-10-17 NOTE — Progress Notes (Signed)
  Progress Note   Date: 10/17/2023  Patient Name: Stacy Murillo        MRN#: [id number]   Clarification of diagnosis:   Obesity class II

## 2023-10-17 NOTE — Progress Notes (Signed)
 Dr. Meyer Ada at bedside and RN made her aware that diastolic blood pressure was elevated this morning. Initial morning BP 130/113 and recheck 129/102. MD acknowledged and stated that her midodrine  is on hold and to continue to monitor her blood pressure. No further orders.

## 2023-10-18 DIAGNOSIS — S72002A Fracture of unspecified part of neck of left femur, initial encounter for closed fracture: Secondary | ICD-10-CM | POA: Diagnosis not present

## 2023-10-18 DIAGNOSIS — R338 Other retention of urine: Secondary | ICD-10-CM | POA: Clinically undetermined

## 2023-10-18 LAB — CBC
HCT: 36.3 % (ref 36.0–46.0)
Hemoglobin: 12.1 g/dL (ref 12.0–15.0)
MCH: 30.1 pg (ref 26.0–34.0)
MCHC: 33.3 g/dL (ref 30.0–36.0)
MCV: 90.3 fL (ref 80.0–100.0)
Platelets: 128 10*3/uL — ABNORMAL LOW (ref 150–400)
RBC: 4.02 MIL/uL (ref 3.87–5.11)
RDW: 12.9 % (ref 11.5–15.5)
WBC: 8.6 10*3/uL (ref 4.0–10.5)
nRBC: 0 % (ref 0.0–0.2)

## 2023-10-18 MED ORDER — SENNA 8.6 MG PO TABS
2.0000 | ORAL_TABLET | Freq: Every day | ORAL | Status: DC
  Administered 2023-10-18 – 2023-10-24 (×6): 17.2 mg via ORAL
  Filled 2023-10-18 (×7): qty 2

## 2023-10-18 MED ORDER — CHLORHEXIDINE GLUCONATE CLOTH 2 % EX PADS
6.0000 | MEDICATED_PAD | Freq: Every day | CUTANEOUS | Status: DC
  Administered 2023-10-18 – 2023-10-22 (×5): 6 via TOPICAL

## 2023-10-18 NOTE — Progress Notes (Signed)
 Progress Note   Patient: Stacy Murillo ZOX:[id number] DOB: Aug 24, 1955 DOA: 10/15/2023     3 DOS: the patient was seen and examined on 10/18/2023   Brief hospital course:  Stacy Murillo is a 68 y.o. 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   Mrs. Stacy Murillo states she and her husband were on the way to dinner, and he was helping her out of the car.  When she went to take her first step onto the ramp, her foot hit the curb and she fell onto her left side.  Due to severe pain and inability to bear weight since then, EMS was called.  No loss of consciousness or head trauma noted.  Ms. Stacy Murillo states that he was holding onto her when she fell and so she did not hit the ground very hard.   ED course: On arrival to the ED, patient is hypertensive at 153/85 with a heart rate of 72.  She was saturating at 100% on room air.  She was afebrile at 97.6.  Initial workup notable for unremarkable CBC, creatinine 1.40 with GFR 41.  Chest x-ray with no active disease.  Hip x-ray with acute nondisplaced fracture through the left femoral neck.  Orthopedic surgery consulted with plans for the OR tomorrow.  TRH contacted for admission.       Assessment and Plan:   Femur fracture [facility]) Patient presents for evaluation of left hip pain following a fall and imaging showed left, nondisplaced femoral neck fracture after a mechanical ground-level fall.   Given low impact, likely secondary to osteoporosis. - Pain control with Tylenol , oxycodone , and Dilaudid  - Nonweightbearing - Continuous pulse oximetry while receiving IV opioids - Patient is status post percutaneous fixation of left femur fracture (POD 2) - Appreciate PT evaluation, recommend SNF on discharge     CKD (chronic kidney disease), stage III (HCC) History of CKD stage III  with baseline creatinine ranging between 1.1 and 1.4, currently at baseline.     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  She is currently at baseline. - Delirium precautions - Safety sitter since patient remains confused and not easily directable.   - She is at increased risk for falls   DLBCL (diffuse large B cell lymphoma) (HCC) History of recurrent diffuse large B-cell lymphoma, currently under surveillance only.   Acute urinary retention Most likely related to immobility/medication induced Insert Foley catheter Voiding trial prior to discharge   - Continue outpatient follow-up with oncology           Subjective: Seen and examined at the bedside.  Having visual hallucination.  No history of alcohol use  Physical Exam: Vitals:   10/17/23 1638 10/17/23 2010 10/18/23 0452 10/18/23 0745  BP: (!) 140/60 (!) 157/86 (!) 160/78 (!) 147/74  Pulse: 80 84 74 74  Resp:  16 15 18   Temp:  98.2 F (36.8 C) 98.1 F (36.7 C) 98.4 F (36.9 C)  TempSrc:  Oral Oral   SpO2:  98% 98% 99%  Weight:      Height:        General: She is not in acute distress.  Appearance: She is normal weight. She is not toxic-appearing.  HENT:     Head: Normocephalic and atraumatic.     Mouth/Throat:     Mouth: Mucous membranes are moist.     Pharynx: Oropharynx is clear.  Cardiovascular:     Rate and Rhythm: Normal rate and regular rhythm.     Pulses:          Dorsalis pedis pulses are 2+ on the right side and 2+ on the left side.     Heart sounds: No murmur heard.    No gallop.  Pulmonary:     Effort: Pulmonary effort is normal. No respiratory distress.     Breath sounds: Normal breath sounds.  Musculoskeletal:     Right lower leg: No edema.      Left lower leg: No edema.  Decreased range of motion left lower extremity Skin:    General: Skin is warm and dry.  Neurological:     Mental Status: She is alert.     Comments:  Patient is alert and oriented, however unclear on details regarding her medical history.  Known history of cognitive decline.  Psychiatric:        Mood and Affect: Mood normal.        Behavior: Behavior normal.    Data Reviewed: Creatinine 1.34 Labs reviewed  Family Communication: Plan of care discussed with patient daughter at the bedside.  She verbalizes understanding and agrees with the plan  Disposition: Status is: Inpatient Remains inpatient appropriate because: Awaiting discharge to SNF  Planned Discharge Destination: Skilled nursing facility    Time spent: 33 minutes  Author: Read Camel, MD 10/18/2023 3:03 PM  For on call review [email or web address].

## 2023-10-18 NOTE — Progress Notes (Signed)
 Subjective: 2 Days Post-Op Procedure(s) (LRB): FIXATION, FEMUR, NECK, PERCUTANEOUS, USING SCREW (Left) Patient reports pain as mild.   Patient is well, however reports a little bit of anxiety this AM, states she sees ants crawling on the ceiling.  Patient does have a history of dementia, but daughter seemed concerned that this was not consistent with her baseline PT and care management to assist with discharge planning. Negative for chest pain and shortness of breath Fever: no Gastrointestinal:Negative for nausea and vomiting, reports she is passing some gas this AM.  Objective: Vital signs in last 24 hours: Temp:  [97.8 F (36.6 C)-98.4 F (36.9 C)] 98.4 F (36.9 C) (06/06 0745) Pulse Rate:  [67-84] 74 (06/06 0745) Resp:  [15-18] 18 (06/06 0745) BP: (129-160)/(60-122) 147/74 (06/06 0745) SpO2:  [96 %-100 %] 99 % (06/06 0745)  Intake/Output from previous day:  Intake/Output Summary (Last 24 hours) at 10/18/2023 0823 Last data filed at 10/18/2023 0607 Gross per 24 hour  Intake 1496.37 ml  Output 900 ml  Net 596.37 ml    Intake/Output this shift: No intake/output data recorded.  Labs: Recent Labs    10/15/23 1951 10/17/23 0802 10/18/23 0406  HGB 13.8 14.2 12.1   Recent Labs    10/17/23 0802 10/18/23 0406  WBC 8.9 8.6  RBC 4.76 4.02  HCT 42.8 36.3  PLT 122* 128*   Recent Labs    10/16/23 0315 10/17/23 0802  NA 138 137  K 4.0 4.0  CL 102 103  CO2 25 22  BUN 17 16  CREATININE 1.41* 1.34*  GLUCOSE 129* 134*  CALCIUM  9.7 9.0   Recent Labs    10/15/23 1951  INR 0.9     EXAM General - Patient is Alert and Confused.  Safety mittens in place, h/o cognitive impairment. Extremity - ABD soft Neurovascular intact Dorsiflexion/Plantar flexion intact Incision: dressing C/D/I No cellulitis present Compartment soft Dressing/Incision - clean, dry, no drainage noted to the left hip honeycomb dressing. Motor Function - intact, moving foot and toes well on exam.   Abdomen soft with intact bowel sounds.  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   Leg cramps    Lymphoma (HCC) 09/14/2015   Monoclonal B cell lymphoma   Night sweats    Osteoporosis    Overweight(278.02)    Obesity   Palpitations    Shortness of breath dyspnea    Tachycardia    Thrombocytopenia (HCC)     Assessment/Plan: 2 Days Post-Op Procedure(s) (LRB): FIXATION, FEMUR, NECK, PERCUTANEOUS, USING SCREW (Left) Principal Problem:   Femur fracture (HCC) Active Problems:   DLBCL (diffuse large B cell lymphoma) (HCC)   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   Weight as of this encounter: 93.2 kg. Advance diet Up with therapy D/C IV fluids when tolerating po intake.  Vitals and labs reviewed this AM. Up with PT.  Patient currently lives at home, may need SNF at discharge. TOC looking for  SNF placement Continue to work on a BM.  Following discharge, continue Lovenox  40mg  daily for 14 days. Follow-up with [facility] in 10-14 days for staple removal and x-rays of the left hip.  DVT Prophylaxis - Lovenox  and TED hose TTWB to the left leg.  Standley Earing, PA-C [facility] Orthopaedic Surgery 10/18/2023, 8:23 AM

## 2023-10-18 NOTE — Progress Notes (Signed)
 Physical Therapy Treatment Patient Details Name: Denelle B [place] MRN: [id number] DOB: 14-Aug-1955 Today's Date: 10/18/2023   History of Present Illness 68 y/o female presented to ED on 10/15/23 after fall. Sustained acute nondisplaced fracture through the L femoral neck. S/p L femoral neck screw fixation on 6/4. PMH: diffuse large B-cell lymphoma with secondary lymphoma of brain, progressive cognitive impairment, recurrent falls, chronic hypotension    PT Comments  Patient reclined in bed on arrival and agreeable to PT/OT treatment session. Family at bedside and supportive with assisting with cueing due to patient's baseline memory deficits. Required max-totalA+2 for bed mobility and maxA+2 for sit to stand x 3 with RW. Unable to maintain TTWB without max cueing and assist. Discharge plan remains appropriate.     If plan is discharge home, recommend the following: Two people to help with walking and/or transfers;Two people to help with bathing/dressing/bathroom;Assistance with cooking/housework;Direct supervision/assist for financial management;Direct supervision/assist for medications management;Assist for transportation;Help with stairs or ramp for entrance;Supervision due to cognitive status   Can travel by private vehicle     No  Equipment Recommendations  Other (comment) (TBD)    Recommendations for Other Services       Precautions / Restrictions Precautions Precautions: Fall Recall of Precautions/Restrictions: Impaired Restrictions Weight Bearing Restrictions Per Provider Order: Yes LLE Weight Bearing Per Provider Order: Touchdown weight bearing     Mobility  Bed Mobility Overal bed mobility: Needs Assistance Bed Mobility: Supine to Sit, Sit to Supine     Supine to sit: Total assist, +2 for physical assistance, +2 for safety/equipment Sit to supine: Total assist, +2 for safety/equipment, +2 for physical assistance        Transfers Overall transfer level: Needs  assistance Equipment used: Rolling Matthan Sledge (2 wheels) Transfers: Sit to/from Stand Sit to Stand: Max assist, +2 safety/equipment, +2 physical assistance, From elevated surface           General transfer comment: constant cueing to maintain weight off of L foot with therapist foot under patient. Stood x 3 but tolerates standing ~3-5 seconds    Ambulation/Gait                   Stairs             Wheelchair Mobility     Tilt Bed    Modified Rankin (Stroke Patients Only)       Balance Overall balance assessment: Needs assistance Sitting-balance support: Bilateral upper extremity supported, Feet supported Sitting balance-Leahy Scale: Poor     Standing balance support: Bilateral upper extremity supported, Reliant on assistive device for balance Standing balance-Leahy Scale: Poor                              Communication Communication Communication: No apparent difficulties  Cognition Arousal: Alert Behavior During Therapy: WFL for tasks assessed/performed   PT - Cognitive impairments: History of cognitive impairments                         Following commands: Impaired Following commands impaired: Follows one step commands with increased time    Cueing Cueing Techniques: Verbal cues, Gestural cues, Tactile cues, Visual cues  Exercises      General Comments        Pertinent Vitals/Pain Pain Assessment Pain Assessment: Faces Faces Pain Scale: Hurts little more Pain Location: L hip Pain Descriptors / Indicators: Grimacing, Guarding Pain Intervention(s): Limited activity  within patient's tolerance, Monitored during session, Repositioned    Home Living                          Prior Function            PT Goals (current goals can now be found in the care plan section) Acute Rehab PT Goals Patient Stated Goal: did not state PT Goal Formulation: With family Time For Goal Achievement: 10/31/23 Potential to  Achieve Goals: Fair Progress towards PT goals: Progressing toward goals    Frequency    Min 3X/week      PT Plan      Co-evaluation PT/OT/SLP Co-Evaluation/Treatment: Yes Reason for Co-Treatment: For patient/therapist safety;To address functional/ADL transfers PT goals addressed during session: Proper use of DME;Mobility/safety with mobility OT goals addressed during session: ADL's and self-care      AM-PAC PT "6 Clicks" Mobility   Outcome Measure  Help needed turning from your back to your side while in a flat bed without using bedrails?: Total Help needed moving from lying on your back to sitting on the side of a flat bed without using bedrails?: Total Help needed moving to and from a bed to a chair (including a wheelchair)?: Total Help needed standing up from a chair using your arms (e.g., wheelchair or bedside chair)?: Total Help needed to walk in hospital room?: Total Help needed climbing 3-5 steps with a railing? : Total 6 Click Score: 6    End of Session   Activity Tolerance: Patient tolerated treatment well Patient left: in bed;with call bell/phone within reach;with bed alarm set;with family/visitor present;with restraints reapplied Nurse Communication: Mobility status PT Visit Diagnosis: Unsteadiness on feet (R26.81);Muscle weakness (generalized) (M62.81);Other abnormalities of gait and mobility (R26.89);History of falling (Z91.81);Difficulty in walking, not elsewhere classified (R26.2)     Time: 3244-0102 PT Time Calculation (min) (ACUTE ONLY): 33 min  Charges:    $Therapeutic Activity: 8-22 mins PT General Charges $$ ACUTE PT VISIT: 1 Visit                     Janine Melbourne, PT, DPT Physical Therapist - [facility]  [facility]    Tyreon Frigon A Tyjah Hai 10/18/2023, 3:09 PM

## 2023-10-18 NOTE — TOC Initial Note (Signed)
 Transition of Care [facility]) - Initial/Assessment Note    Patient Details  Name: Stacy Murillo [place] MRN: [id number] Date of Birth: 1955-06-21  Transition of Care [facility]) CM/SW Contact:    Alexandra Ice, RN Phone Number: 10/18/2023, 12:58 PM  Clinical Narrative:                 Met with patient and spouse, Asa Lauth, at bedside. He states he is on wait list at [facility] and is wondering if she would go there for skilled rehab stay. TOC explained will reach out to facility and see if they can accommodate patient. TOC also stated sent out referrals to other facilities and patient is pending bed offers. Will follow up with him when patient has bed offers, he verbalized understanding.   TOC spoke with Jullie Oiler at [facility], she stated the DON will review patient and she if they can accommodate patient. TOC stated patient is not read for discharge. She remains in mittens and telesit.   Expected Discharge Plan: Skilled Nursing Facility Barriers to Discharge: Continued Medical Work up   Patient Goals and CMS Choice Patient states their goals for this hospitalization and ongoing recovery are:: get better CMS Medicare.gov Compare Post Acute Care list provided to:: Patient Represenative (must comment) (Spouse: Joe) Choice offered to / list presented to : Spouse      Expected Discharge Plan and Services   Discharge Planning Services: CM Consult Post Acute Care Choice: Skilled Nursing Facility Living arrangements for the past 2 months: Single Family Home                   DME Agency: NA       HH Arranged: NA          Prior Living Arrangements/Services Living arrangements for the past 2 months: Single Family Home Lives with:: Spouse Patient language and need for interpreter reviewed:: Yes Do you feel safe going back to the place where you live?: Yes      Need for Family Participation in Patient Care: Yes (Comment) Care giver support system in place?: Yes (comment)    Criminal Activity/Legal Involvement Pertinent to Current Situation/Hospitalization: No - Comment as needed  Activities of Daily Living   ADL Screening (condition at time of admission) Independently performs ADLs?: Yes (appropriate for developmental age) Is the patient deaf or have difficulty hearing?: No Does the patient have difficulty seeing, even when wearing glasses/contacts?: No Does the patient have difficulty concentrating, remembering, or making decisions?: Yes  Permission Sought/Granted                  Emotional Assessment Appearance:: Appears stated age Attitude/Demeanor/Rapport: Unable to Assess Affect (typically observed): Unable to Assess Orientation: : Fluctuating Orientation (Suspected and/or reported Sundowners) Alcohol / Substance Use: Not Applicable Psych Involvement: No (comment)  Admission diagnosis:  Femur fracture (HCC) [S72.90XA] Displaced fracture of left femoral neck (HCC) [S72.002A] Fall, initial encounter [W19.XXXA] Patient Active Problem List   Diagnosis Date Noted   Femur fracture (HCC) 10/15/2023   CKD (chronic kidney disease), stage III (HCC) 10/15/2023   Seborrheic eczema of scalp 08/13/2023   Port-A-Cath in place 05/17/2022   Generalized weakness    Altered mental status 02/25/2022   Fall at home, initial encounter 02/25/2022   Gait instability 02/25/2022   Dehydration 02/25/2022   COVID-19 05/09/2021   Hypotension, chronic 03/25/2021   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) 03/19/2019   Primary CNS lymphoma 03/08/2019   Goals of care, counseling/discussion 03/08/2019   Lesion of frontal lobe of brain 02/19/2019   Weight gain, abnormal 12/23/2018   Generalized anxiety disorder 02/09/2018   History of autologous stem cell transplant (HCC) 01/15/2018   Immunization due 01/15/2018   Vaginal dryness, menopausal 12/18/2017   Stem cell transplant  candidate 05/30/2017   Diverticulitis large intestine w/o perforation or abscess w/bleeding 02/17/2017   DLBCL (diffuse large Murillo cell lymphoma) (HCC) 05/25/2016   DVT (deep venous thrombosis) (HCC) 10/27/2015   Generalized headaches 10/27/2015   Oral mucositis due to antineoplastic therapy 10/14/2015   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   Left knee pain 07/20/2014   Esophageal reflux 04/06/2014   Special screening for malignant neoplasms, colon 04/06/2014   Screening for breast cancer 07/10/2011   Osteopenia 07/06/2011   Vitamin D  deficiency 07/06/2011   Hyperlipidemia 07/06/2011   Disorder of bone and cartilage 07/06/2011   Osteoporosis    Overweight 10/19/2008   PCP:  Thersia Flax, MD Pharmacy:   [facility] - [place], [place] - [address] [place] [address] Phone: [phone] Fax: [phone]     Social Drivers of Health (SDOH) Social History: SDOH Screenings   Food Insecurity: No Food Insecurity (10/15/2023)  Housing: Unknown (10/16/2023)  Transportation Needs: No Transportation Needs (10/15/2023)  Utilities: Not At Risk (10/15/2023)  Alcohol Screen: Low Risk  (07/08/2023)  Depression (PHQ2-9): Low Risk  (08/13/2023)  Financial Resource Strain: Low Risk  (07/08/2023)  Physical Activity: Inactive (07/08/2023)  Social Connections: Moderately Isolated (10/15/2023)  Stress: No Stress Concern Present (07/08/2023)  Tobacco Use: Low Risk  (10/16/2023)  Health Literacy: Inadequate Health Literacy (07/08/2023)   SDOH Interventions:     Readmission Risk Interventions     No data to display

## 2023-10-18 NOTE — Plan of Care (Signed)

## 2023-10-18 NOTE — Progress Notes (Signed)
 Occupational Therapy Treatment Patient Details Name: Stacy Murillo Stacy Murillo MRN: [id number] DOB: December 03, 1955 Today's Date: 10/18/2023   History of present illness 68 y/o female presented to ED on 10/15/23 after fall. Sustained acute nondisplaced fracture through the L femoral neck. S/p L femoral neck screw fixation on 6/4. 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 Upon arrival to room pt in bed, agreeable to tx. Family at bedside to assist with cueing as pt has baseline memory deficits. Pt requires MAX A s2 bed mobility, improved sititng balance/tolerance this date. MIN A hair brushing in sitting. MAX A x2 + RW sit<>stand x3 trials, unable to maintain/sequence TTWB without significant cueing/assist. Pt making good progress toward goals, will continue to follow POC. Discharge recommendation remains appropriate.       If plan is discharge home, recommend the following:  Two people to help with walking and/or transfers;Two people to help with bathing/dressing/bathroom   Equipment Recommendations  Hospital bed    Recommendations for Other Services      Precautions / Restrictions Precautions Precautions: Fall Recall of Precautions/Restrictions: Impaired Restrictions Weight Bearing Restrictions Per Provider Order: Yes LLE Weight Bearing Per Provider Order: Touchdown weight bearing       Mobility Bed Mobility Overal bed mobility: Needs Assistance Bed Mobility: Supine to Sit, Sit to Supine     Supine to sit: Max assist, +2 for physical assistance Sit to supine: +2 for physical assistance, Max assist        Transfers Overall transfer level: Needs assistance Equipment used: Rolling walker (2 wheels) Transfers: Sit to/from Stand Sit to Stand: Max assist, +2 physical assistance           General transfer comment: x3 stands  with +2 assist to maintain weight bearing precautions     Balance Overall balance assessment: Needs assistance Sitting-balance support: Bilateral upper extremity supported, Feet supported Sitting balance-Leahy Scale: Fair     Standing balance support: Bilateral upper extremity supported, Reliant on assistive device for balance Standing balance-Leahy Scale: Poor                             ADL either performed or assessed with clinical judgement   ADL Overall ADL's : Needs assistance/impaired                                       General ADL Comments: MIN A hair brushing in sitting. MAX A don Murillo socks in sitting    Extremity/Trunk Assessment              Vision       Perception     Praxis     Communication Communication Communication: No apparent difficulties   Cognition Arousal: Alert Behavior During Therapy: WFL for tasks assessed/performed Cognition: History of cognitive impairments             OT - Cognition Comments: significant baseline STM deficits                 Following commands: Impaired Following commands impaired: Follows one step commands with increased time      Cueing      Exercises      Shoulder Instructions       General Comments  Pertinent Vitals/ Pain       Pain Assessment Pain Assessment: PAINAD Breathing: normal Negative Vocalization: occasional moan/groan, low speech, negative/disapproving quality Facial Expression: sad, frightened, frown Body Language: relaxed Consolability: distracted or reassured by voice/touch PAINAD Score: 3 Pain Location: L hip Pain Descriptors / Indicators: Grimacing, Guarding Pain Intervention(s): Limited activity within patient's tolerance, Repositioned  Home Living                                          Prior Functioning/Environment              Frequency  Min 2X/week        Progress Toward Goals  OT Goals(current  goals can now be found in the care plan section)  Progress towards OT goals: Progressing toward goals  Acute Rehab OT Goals OT Goal Formulation: With patient/family Time For Goal Achievement: 10/31/23 Potential to Achieve Goals: Fair ADL Goals Pt Will Perform Grooming: with set-up;with supervision;sitting Pt Will Perform Lower Body Dressing: with mod assist;sit to/from stand Pt Will Transfer to Toilet: with mod assist;squat pivot transfer;bedside commode  Plan      Co-evaluation    PT/OT/SLP Co-Evaluation/Treatment: Yes Reason for Co-Treatment: For patient/therapist safety;To address functional/ADL transfers PT goals addressed during session: Proper use of DME;Mobility/safety with mobility OT goals addressed during session: ADL's and self-care      AM-PAC OT "6 Clicks" Daily Activity     Outcome Measure   Help from another person eating meals?: A Little Help from another person taking care of personal grooming?: A Little Help from another person toileting, which includes using toliet, bedpan, or urinal?: A Lot Help from another person bathing (including washing, rinsing, drying)?: A Lot Help from another person to put on and taking off regular upper body clothing?: A Little Help from another person to put on and taking off regular lower body clothing?: A Lot 6 Click Score: 15    End of Session Equipment Utilized During Treatment: Rolling walker (2 wheels)  OT Visit Diagnosis: Other abnormalities of gait and mobility (R26.89);Muscle weakness (generalized) (M62.81)   Activity Tolerance Patient tolerated treatment well   Patient Left in bed;with call bell/phone within reach;with bed alarm set;with family/visitor present   Nurse Communication          Time: 3086-5784 OT Time Calculation (min): 33 min  Charges: OT General Charges $OT Visit: 1 Visit OT Treatments $Self Care/Home Management : 8-22 mins  Gordan Latina, M.S. OTR/L  10/18/23, 3:05 PM  ascom  [phone]

## 2023-10-19 DIAGNOSIS — S72002A Fracture of unspecified part of neck of left femur, initial encounter for closed fracture: Secondary | ICD-10-CM | POA: Diagnosis not present

## 2023-10-19 NOTE — Care Management Important Message (Signed)
 Important Message  Patient Details  Name: Stacy Murillo MRN: [id number] Date of Birth: [birth year]   Important Message Given:  Yes - Medicare IM     Anise Kerns 10/19/2023, 1:16 PM

## 2023-10-19 NOTE — Progress Notes (Signed)
 Progress Note   Patient: Stacy Murillo:[id number] DOB: [birth year] DOA: 10/15/2023     4 DOS: the patient was seen and examined on 10/19/2023   Brief hospital course:  Tianah B [place] is a 68 y.o. 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   [name] states she and her husband were on the way to dinner, and he was helping her out of the car.  When she went to take her first step onto the ramp, her foot hit the curb and she fell onto her left side.  Due to severe pain and inability to bear weight since then, EMS was called.  No loss of consciousness or head trauma noted.  Ms. [place] states that he was holding onto her when she fell and so she did not hit the ground very hard.   ED course: On arrival to the ED, patient is hypertensive at 153/85 with a heart rate of 72.  She was saturating at 100% on room air.  She was afebrile at 97.6.  Initial workup notable for unremarkable CBC, creatinine 1.40 with GFR 41.  Chest x-ray with no active disease.  Hip x-ray with acute nondisplaced fracture through the left femoral neck.  Orthopedic surgery consulted with plans for the OR tomorrow.  TRH contacted for admission.       Assessment and Plan:  Femur fracture [facility]) Patient presents for evaluation of left hip pain following a fall and imaging showed left, nondisplaced femoral neck fracture after a mechanical ground-level fall.   Given low impact, likely secondary to osteoporosis. - Pain control with Tylenol , oxycodone , and Dilaudid  - Nonweightbearing - Continuous pulse oximetry while receiving IV opioids - Patient is status post percutaneous fixation of left femur fracture (POD 3) - Appreciate PT evaluation, recommend SNF on discharge     CKD (chronic kidney disease), stage III (HCC) History of CKD stage III  with baseline creatinine ranging between 1.1 and 1.4, currently at baseline.     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  She is currently at baseline. - Delirium precautions - Safety sitter since patient remains confused and not easily directable.   - She is at increased risk for falls   DLBCL (diffuse large B cell lymphoma) (HCC) History of recurrent diffuse large B-cell lymphoma, currently under surveillance only.  - Continue outpatient follow-up with oncology   Acute urinary retention Most likely related to immobility/medication induced Insert Foley catheter Voiding trial prior to discharge Start patient on Flomax 0.4 mg daily          Subjective: Lethargic.  Received oxycodone  prior to rounds  Physical Exam: Vitals:   10/18/23 1525 10/18/23 2024 10/19/23 0500 10/19/23 0744  BP: (!) 147/82 136/60 (!) 116/58 (!) 140/76  Pulse: 95 99 90 91  Resp: 18 18 18 16   Temp: 98.1 F (36.7 C) 98.3 F (36.8 C) 98.6 F (37 C) 99.2 F (37.3 C)  TempSrc:  Oral Oral   SpO2: 96% 100% 98% 100%  Weight:      Height:       General: She is not in acute distress.    Appearance: She is  normal weight. She is not toxic-appearing.  HENT:     Head: Normocephalic and atraumatic.     Mouth/Throat:     Mouth: Mucous membranes are moist.     Pharynx: Oropharynx is clear.  Cardiovascular:     Rate and Rhythm: Normal rate and regular rhythm.     Pulses:          Dorsalis pedis pulses are 2+ on the right side and 2+ on the left side.     Heart sounds: No murmur heard.    No gallop.  Pulmonary:     Effort: Pulmonary effort is normal. No respiratory distress.     Breath sounds: Normal breath sounds.  Musculoskeletal:     Right lower leg: No  edema.     Left lower leg: No edema.  Decreased range of motion left lower extremity Skin:    General: Skin is warm and dry.  Neurological:     Mental Status: She is alert.     Comments:  Patient is alert and oriented, however unclear on details regarding her medical history.  Known history of cognitive decline.  Psychiatric:        Mood and Affect: Mood normal.        Behavior: Behavior normal.     Data Reviewed:  There are no new results to review at this time.  Family Communication: Plan of care discussed with patient's husband and daughter at the bedside  Disposition: Status is: Inpatient Remains inpatient appropriate because: Awaiting discharge to SNF  Planned Discharge Destination: Skilled nursing facility    Time spent: 35 minutes  Author: Read Camel, MD 10/19/2023 2:15 PM  For on call review [email or web address].

## 2023-10-19 NOTE — Plan of Care (Signed)

## 2023-10-19 NOTE — Plan of Care (Signed)

## 2023-10-19 NOTE — Progress Notes (Signed)
 Subjective: 3 Days Post-Op Procedure(s) (LRB): FIXATION, FEMUR, NECK, PERCUTANEOUS, USING SCREW (Left) Patient reports pain as mild.   Patient is well, however reports being a little on edge. Patient does have a history of dementia, husband in room with visit. PT and care management to assist with discharge planning. Negative for chest pain and shortness of breath Fever: no Gastrointestinal:Negative for nausea and vomiting, reports she is passing some gas this AM.  Objective: Vital signs in last 24 hours: Temp:  [98.1 F (36.7 C)-99.2 F (37.3 C)] 99.2 F (37.3 C) (06/07 0744) Pulse Rate:  [90-99] 91 (06/07 0744) Resp:  [16-18] 16 (06/07 0744) BP: (116-147)/(58-82) 140/76 (06/07 0744) SpO2:  [96 %-100 %] 100 % (06/07 0744)  Intake/Output from previous day:  Intake/Output Summary (Last 24 hours) at 10/19/2023 1149 Last data filed at 10/19/2023 0850 Gross per 24 hour  Intake 1090 ml  Output --  Net 1090 ml    Intake/Output this shift: Total I/O In: 850 [Other:850] Out: -   Labs: Recent Labs    10/17/23 0802 10/18/23 0406  HGB 14.2 12.1   Recent Labs    10/17/23 0802 10/18/23 0406  WBC 8.9 8.6  RBC 4.76 4.02  HCT 42.8 36.3  PLT 122* 128*   Recent Labs    10/17/23 0802  NA 137  K 4.0  CL 103  CO2 22  BUN 16  CREATININE 1.34*  GLUCOSE 134*  CALCIUM  9.0   No results for input(s): "LABPT", "INR" in the last 72 hours.  EXAM General - Patient is Alert and Confused.  Safety mittens in place, h/o cognitive impairment. Extremity - ABD soft Neurovascular intact Dorsiflexion/Plantar flexion intact Incision: dressing C/D/I No cellulitis present Compartment soft Dressing/Incision - clean, dry, no drainage noted to the left hip honeycomb dressing. Motor Function - intact, moving foot and toes well on exam.  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   Leg cramps    Lymphoma (HCC) 09/14/2015   Monoclonal B cell lymphoma   Night sweats    Osteoporosis    Overweight(278.02)    Obesity   Palpitations    Shortness of breath dyspnea    Tachycardia    Thrombocytopenia (HCC)     Assessment/Plan: 3 Days Post-Op Procedure(s) (LRB): FIXATION, FEMUR, NECK, PERCUTANEOUS, USING SCREW (Left) Principal Problem:   Femur fracture (HCC) Active Problems:   DLBCL (diffuse large B cell lymphoma) (HCC)   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   Weight as of this encounter: 93.2 kg. Advance diet Up with therapy D/C IV fluids when tolerating po intake.  Vitals and labs reviewed this AM. Up with PT.  Patient currently lives at home, may need SNF at discharge. TOC looking for SNF placement Continue to work on a BM.  Following discharge, continue Lovenox  40mg  daily for 14 days. Follow-up with [facility] in 10-14 days for staple removal and  x-rays of the left hip.  DVT Prophylaxis - Lovenox  and TED hose TTWB to the left leg.  Standley Earing, PA-C [facility] Orthopaedic Surgery 10/19/2023, 11:49 AM

## 2023-10-20 ENCOUNTER — Encounter: Payer: Self-pay | Admitting: Internal Medicine

## 2023-10-20 ENCOUNTER — Inpatient Hospital Stay

## 2023-10-20 DIAGNOSIS — S72002A Fracture of unspecified part of neck of left femur, initial encounter for closed fracture: Secondary | ICD-10-CM | POA: Diagnosis not present

## 2023-10-20 LAB — URINALYSIS, COMPLETE (UACMP) WITH MICROSCOPIC
Bacteria, UA: NONE SEEN
Bilirubin Urine: NEGATIVE
Glucose, UA: NEGATIVE mg/dL
Ketones, ur: NEGATIVE mg/dL
Leukocytes,Ua: NEGATIVE
Nitrite: NEGATIVE
Protein, ur: NEGATIVE mg/dL
Specific Gravity, Urine: 1.013 (ref 1.005–1.030)
pH: 6 (ref 5.0–8.0)

## 2023-10-20 MED ORDER — SODIUM CHLORIDE 0.9 % IV SOLN: INTRAVENOUS | Status: AC

## 2023-10-20 MED ORDER — BISACODYL 10 MG RE SUPP
10.0000 mg | Freq: Once | RECTAL | Status: AC
  Administered 2023-10-20: 10 mg via RECTAL
  Filled 2023-10-20: qty 1

## 2023-10-20 MED ORDER — MENTHOL 3 MG MT LOZG
1.0000 | LOZENGE | OROMUCOSAL | Status: DC | PRN
  Administered 2023-10-22: 3 mg via ORAL
  Filled 2023-10-20: qty 9

## 2023-10-20 NOTE — Progress Notes (Signed)
 Progress Note   Patient: Stacy Murillo VWU:[id number] DOB: 1955/08/13 DOA: 10/15/2023     5 DOS: the patient was seen and examined on 10/20/2023   Brief hospital course:  Stacy Murillo is a 68 y.o. 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   Stacy Murillo states she and her husband were on the way to dinner, and he was helping her out of the car.  When she went to take her first step onto the ramp, her foot hit the curb and she fell onto her left side.  Due to severe pain and inability to bear weight since then, EMS was called.  No loss of consciousness or head trauma noted.  Stacy Murillo states that he was holding onto her when she fell and so she did not hit the ground very hard.   ED course: On arrival to the ED, patient is hypertensive at 153/85 with a heart rate of 72.  She was saturating at 100% on room air.  She was afebrile at 97.6.  Initial workup notable for unremarkable CBC, creatinine 1.40 with GFR 41.  Chest x-ray with no active disease.  Hip x-ray with acute nondisplaced fracture through the left femoral neck.  Orthopedic surgery consulted with plans for the OR tomorrow.  TRH contacted for admission.       Assessment and Plan:  Femur fracture [facility]) Patient presents for evaluation of left hip pain following a fall and imaging showed left, nondisplaced femoral neck fracture after a mechanical ground-level fall.   Given low impact, likely secondary to osteoporosis. - Pain control with Tylenol , oxycodone , and Dilaudid  - Patient is status post percutaneous fixation of left femur fracture (POD 4) - WBAT - Appreciate PT evaluation, recommend SNF on discharge     CKD (chronic kidney disease), stage III (HCC) History of CKD stage III with baseline creatinine ranging between 1.1 and 1.4, currently at  baseline.     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  She is currently at baseline. - Delirium precautions - Safety sitter since patient remains confused and not easily directable.   - She is at increased risk for falls   DLBCL (diffuse large B cell lymphoma) (HCC) History of recurrent diffuse large B-cell lymphoma, currently under surveillance only.  - Continue outpatient follow-up with oncology   Acute urinary retention Most likely related to immobility/medication induced Insert Foley catheter Voiding trial prior to discharge Start patient on Flomax 0.4 mg daily      Constipation Secondary to immobility and opioid use Continue Miralax  and senna Dulcolax suppository x 1           Subjective: More awake and alert. Daughter at the bedside  Physical Exam: Vitals:   10/19/23 1514 10/19/23 2041 10/20/23 0408 10/20/23 0754  BP: (!) 151/71 (!) 149/94 (!) 159/84 (!) 150/74  Pulse: (!) 103 99 (!) 106 (!) 109  Resp: 18 20 16 16   Temp: 98.7 F (37.1 C) 98.4 F (36.9 C) 98.7 F (37.1 C) 98.4 F (36.9 C)  TempSrc: Oral   Oral  SpO2: 100% 97% 95% 94%  Weight:      Height:  Data Reviewed: {Tip tGeneral: She is not in acute distress.    Appearance: She is normal weight. She is not toxic-appearing.  HENT:     Head: Normocephalic and atraumatic.     Mouth/Throat:     Mouth: Mucous membranes are moist.     Pharynx: Oropharynx is clear.  Cardiovascular:     Rate and Rhythm: Normal rate and regular rhythm.     Pulses:          Dorsalis pedis pulses are 2+ on the right side and 2+ on the left side.     Heart sounds: No murmur heard.    No gallop.  Pulmonary:     Effort: Pulmonary effort is normal. No  respiratory distress.     Breath sounds: Normal breath sounds.  Musculoskeletal:     Right lower leg: No edema.     Left lower leg: No edema.  Decreased range of motion left lower extremity Skin:    General: Skin is warm and dry.  Neurological:     Mental Status: She is alert.     Comments:  Patient is alert and oriented, however unclear on details regarding her medical history.  Known history of cognitive decline.  Psychiatric:        Mood and Affect: Mood normal.        Behavior: Behavior normal.   Labs Reviewed  Family Communication: Plan of care was discussed with patient's daughter at the bedside  Disposition: Status is: Inpatient Remains inpatient appropriate because: Awaiting discharge to SNF  Planned Discharge Destination: Skilled nursing facility    Time spent: 34 minutes  Author: Read Camel, MD 10/20/2023 1:16 PM  For on call review [email or web address].

## 2023-10-20 NOTE — Plan of Care (Signed)
  Problem: Clinical Measurements: Goal: Ability to maintain clinical measurements within normal limits will improve Outcome: Progressing   Problem: Coping: Goal: Level of anxiety will decrease Outcome: Progressing   Problem: Pain Managment: Goal: General experience of comfort will improve and/or be controlled Outcome: Progressing   Problem: Safety: Goal: Ability to remain free from injury will improve Outcome: Progressing   Problem: Skin Integrity: Goal: Risk for impaired skin integrity will decrease Outcome: Progressing

## 2023-10-20 NOTE — Progress Notes (Signed)
 Subjective: 4 Days Post-Op Procedure(s) (LRB): FIXATION, FEMUR, NECK, PERCUTANEOUS, USING SCREW (Left) Patient reports pain as mild.   Patient is well, however reports being a little on edge. Patient does have a history of dementia, husband in room with visit. PT and care management to assist with discharge planning. Negative for chest pain and shortness of breath Fever: no Gastrointestinal:Negative for nausea and vomiting, reports she is passing some gas this AM.  Objective: Vital signs in last 24 hours: Temp:  [98.4 F (36.9 C)-98.7 F (37.1 C)] 98.4 F (36.9 C) (06/08 0754) Pulse Rate:  [99-109] 109 (06/08 0754) Resp:  [16-20] 16 (06/08 0754) BP: (149-159)/(71-94) 150/74 (06/08 0754) SpO2:  [94 %-100 %] 94 % (06/08 0754)  Intake/Output from previous day:  Intake/Output Summary (Last 24 hours) at 10/20/2023 0958 Last data filed at 10/20/2023 0905 Gross per 24 hour  Intake --  Output 800 ml  Net -800 ml    Intake/Output this shift: Total I/O In: -  Out: 150 [Urine:150]  Labs: Recent Labs    10/18/23 0406  HGB 12.1   Recent Labs    10/18/23 0406  WBC 8.6  RBC 4.02  HCT 36.3  PLT 128*   No results for input(s): "NA", "K", "CL", "CO2", "BUN", "CREATININE", "GLUCOSE", "CALCIUM " in the last 72 hours.  No results for input(s): "LABPT", "INR" in the last 72 hours.  EXAM General - Patient is Alert and Confused.  Safety mittens in place, h/o cognitive impairment. Extremity - ABD soft Neurovascular intact Dorsiflexion/Plantar flexion intact Incision: dressing C/D/I No cellulitis present Compartment soft Dressing/Incision - clean, dry, no drainage noted to the left hip honeycomb dressing. Motor Function - intact, moving foot and toes well on exam.  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   Leg cramps    Lymphoma (HCC) 09/14/2015   Monoclonal B cell lymphoma   Night sweats    Osteoporosis    Overweight(278.02)    Obesity   Palpitations    Shortness of breath dyspnea    Tachycardia    Thrombocytopenia (HCC)     Assessment/Plan: 4 Days Post-Op Procedure(s) (LRB): FIXATION, FEMUR, NECK, PERCUTANEOUS, USING SCREW (Left) Principal Problem:   Femur fracture (HCC) Active Problems:   DLBCL (diffuse large B cell lymphoma) (HCC)   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   Weight as of this encounter: 93.2 kg. Advance diet Up with therapy D/C IV fluids when tolerating po intake.  Vitals and labs reviewed this AM. Up with PT.  Patient currently lives at home, may need SNF at discharge. TOC looking for SNF placement Continue to work on a BM.  Following discharge, continue Lovenox  40mg  daily for 14 days. Follow-up with [facility] in 10-14 days for staple removal and x-rays of the left hip.  DVT Prophylaxis - Lovenox  and TED hose TTWB to the left leg.  Dhamar Gregory E. Kaedan Richert, PA-C [facility] Orthopaedic Surgery 10/20/2023, 9:58 AM

## 2023-10-20 NOTE — Plan of Care (Signed)

## 2023-10-21 DIAGNOSIS — S72002A Fracture of unspecified part of neck of left femur, initial encounter for closed fracture: Secondary | ICD-10-CM | POA: Diagnosis not present

## 2023-10-21 NOTE — Progress Notes (Signed)
 Physical Therapy Treatment Patient Details Name: Stacy Murillo MRN: [id number] DOB: [birth year] Today's Date: 10/21/2023   History of Present Illness 68 y/o female presented to ED on 10/15/23 after fall. Sustained acute nondisplaced fracture through the L femoral neck. S/p L femoral neck screw fixation on 6/4. 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 Pt limited by cognition. Limited ability to follow commands and pt becomes agitated with most mobility attempts. Pt required totalAx2 for all mobility. Able to sit EOB for most of session but required min-maxA to maintain upright position due to R lateral lean. Sit <> Stand with RW and totalAx2 but pt unable to maintain TTWB status. Returned to supine with needs in reach. The patient would benefit from further skilled PT intervention to continue to progress towards goals as able.     If plan is discharge home, recommend the following: Two people to help with walking and/or transfers;Two people to help with bathing/dressing/bathroom;Assistance with cooking/housework;Direct supervision/assist for financial management;Direct supervision/assist for medications management;Assist for transportation;Help with stairs or ramp for entrance;Supervision due to cognitive status   Can travel by private vehicle     No  Equipment Recommendations  Other (comment) (TBD)    Recommendations for Other Services       Precautions / Restrictions Precautions Precautions: Fall Recall of Precautions/Restrictions: Impaired Restrictions Weight Bearing Restrictions Per Provider Order: Yes LLE Weight Bearing Per Provider Order: Touchdown weight bearing     Mobility  Bed Mobility Overal bed mobility: Needs Assistance Bed Mobility: Supine to Sit, Sit to Supine     Supine to sit: Total assist, +2 for physical assistance Sit to  supine: Total assist, +2 for physical assistance        Transfers Overall transfer level: Needs assistance Equipment used: Rolling walker (2 wheels) Transfers: Sit to/from Stand Sit to Stand: Total assist           General transfer comment: unable to maintain weight bearing status    Ambulation/Gait                   Stairs             Wheelchair Mobility     Tilt Bed    Modified Rankin (Stroke Patients Only)       Balance Overall balance assessment: Needs assistance Sitting-balance support: Bilateral upper extremity supported, Feet supported Sitting balance-Leahy Scale: Poor Sitting balance - Comments: R lateral lean to offweight L hip Postural control: Right lateral lean Standing balance support: Bilateral upper extremity supported, Reliant on assistive device for balance Standing balance-Leahy Scale: Zero                              Communication    Cognition Arousal: Alert Behavior During Therapy: WFL for tasks assessed/performed   PT - Cognitive impairments: History of cognitive impairments                       PT - Cognition Comments: difficulty following commands and sequencing tasks Following commands: Impaired Following commands impaired: Follows one step commands inconsistently    Cueing Cueing Techniques: Verbal cues, Gestural cues, Tactile cues, Visual cues  Exercises Other Exercises Other Exercises: PROM BLE knee flexion x10, PROM hip abduction/adduction x10    General Comments        Pertinent  Vitals/Pain Pain Assessment Pain Assessment: Faces Faces Pain Scale: Hurts little more Pain Location: L hip Pain Descriptors / Indicators: Grimacing, Guarding, Contraction Pain Intervention(s): Limited activity within patient's tolerance, Monitored during session, Repositioned, Premedicated before session    Home Living                          Prior Function            PT Goals (current  goals can now be found in the care plan section) Progress towards PT goals: Progressing toward goals    Frequency    Min 3X/week      PT Plan      Co-evaluation PT/OT/SLP Co-Evaluation/Treatment: Yes Reason for Co-Treatment: For patient/therapist safety;To address functional/ADL transfers PT goals addressed during session: Proper use of DME;Mobility/safety with mobility OT goals addressed during session: ADL's and self-care      AM-PAC PT "6 Clicks" Mobility   Outcome Measure  Help needed turning from your back to your side while in a flat bed without using bedrails?: Total Help needed moving from lying on your back to sitting on the side of a flat bed without using bedrails?: Total Help needed moving to and from a bed to a chair (including a wheelchair)?: Total Help needed standing up from a chair using your arms (e.g., wheelchair or bedside chair)?: Total Help needed to walk in hospital room?: Total Help needed climbing 3-5 steps with a railing? : Total 6 Click Score: 6    End of Session   Activity Tolerance: Patient limited by pain;Other (comment) (limited by cognition) Patient left: in bed;with call bell/phone within reach;with bed alarm set;with family/visitor present;with restraints reapplied Nurse Communication: Mobility status PT Visit Diagnosis: Unsteadiness on feet (R26.81);Muscle weakness (generalized) (M62.81);Other abnormalities of gait and mobility (R26.89);History of falling (Z91.81);Difficulty in walking, not elsewhere classified (R26.2)     Time: 0981-1914 PT Time Calculation (min) (ACUTE ONLY): 24 min  Charges:    $Therapeutic Activity: 8-22 mins PT General Charges $$ ACUTE PT VISIT: 1 Visit                     Darien Eden PT, DPT 12:43 PM,10/21/23

## 2023-10-21 NOTE — TOC Progression Note (Signed)
 Transition of Care [facility]) - Progression Note    Patient Details  Name: Stacy Murillo [place] MRN: [id number] Date of Birth: [birth year]  Transition of Care [facility]) CM/SW Contact  Alexandra Ice, RN Phone Number: 10/21/2023, 3:19 PM  Clinical Narrative:     Need clarification if patient remains in mittens, these are consider restraints to facilities. Patient requires to be restraint free for 48hrs before they can accept patient.   Expected Discharge Plan: Skilled Nursing Facility Barriers to Discharge: Continued Medical Work up  Expected Discharge Plan and Services   Discharge Planning Services: CM Consult Post Acute Care Choice: Skilled Nursing Facility Living arrangements for the past 2 months: Single Family Home                   DME Agency: NA       HH Arranged: NA           Social Determinants of Health (SDOH) Interventions SDOH Screenings   Food Insecurity: No Food Insecurity (10/15/2023)  Housing: Unknown (10/16/2023)  Transportation Needs: No Transportation Needs (10/15/2023)  Utilities: Not At Risk (10/15/2023)  Alcohol Screen: Low Risk  (07/08/2023)  Depression (PHQ2-9): Low Risk  (08/13/2023)  Financial Resource Strain: Low Risk  (07/08/2023)  Physical Activity: Inactive (07/08/2023)  Social Connections: Moderately Isolated (10/15/2023)  Stress: No Stress Concern Present (07/08/2023)  Tobacco Use: Low Risk  (10/16/2023)  Health Literacy: Inadequate Health Literacy (07/08/2023)    Readmission Risk Interventions     No data to display

## 2023-10-21 NOTE — Progress Notes (Addendum)
 Occupational Therapy Treatment Patient Details Name: Stacy Murillo MRN: [id number] DOB: 21-Jul-1955 Today's Date: 10/21/2023   History of present illness 68 y/o female presented to ED on 10/15/23 after fall. Sustained acute nondisplaced fracture through the L femoral neck. S/p L femoral neck screw fixation on 6/4. 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 Pt limited in participation by cognition, increasingly agitated with mobility attempts and does not consistently follow simple, 1-step commands. TOTAL A +2 for bed mobility, MIN A for static balance seated EOB due to R lateral lean with poor ability to correct, and attempted STS using RW and TOTAL A +2 but poor adherence to TTWB precautions. Family members present to encourage pt to engage and assisted with grooming tasks seated. Returned to supine with family present and needs in reach. OT will continue progressing as able.       If plan is discharge home, recommend the following:  Two people to help with walking and/or transfers;Two people to help with bathing/dressing/bathroom   Equipment Recommendations  Hospital bed       Precautions / Restrictions Precautions Precautions: Fall Recall of Precautions/Restrictions: Impaired Restrictions Weight Bearing Restrictions Per Provider Order: Yes LLE Weight Bearing Per Provider Order: Touchdown weight bearing       Mobility Bed Mobility Overal bed mobility: Needs Assistance Bed Mobility: Supine to Sit, Sit to Supine     Supine to sit: Total assist, +2 for physical assistance Sit to supine: Total assist, +2 for physical assistance   General bed mobility comments: limited participation    Transfers Overall transfer level: Needs assistance Equipment used: Rolling walker (2 wheels) Transfers: Sit to/from  Stand Sit to Stand: Total assist           General transfer comment: unable to maintain weight bearing status     Balance Overall balance assessment: Needs assistance Sitting-balance support: Bilateral upper extremity supported, Feet supported Sitting balance-Leahy Scale: Poor Sitting balance - Comments: R lateral lean to offweight L hip, poor ability to orient to midline without heavy use of bed features, requires support at trunk and repositioning of feet seated EOB Postural control: Right lateral lean Standing balance support: Bilateral upper extremity supported, Reliant on assistive device for balance Standing balance-Leahy Scale: Zero Standing balance comment: pt unable to adhere to WB precautions, returned to seated                           ADL either performed or assessed with clinical judgement   ADL Overall ADL's : Needs assistance/impaired     Grooming: Sitting;Minimal assistance;Brushing hair Grooming Details (indicate cue type and reason): cousin assisted with brushing hair in sitting                               General ADL Comments: Pt increasingly agitated during session. Allowed cousin to assist with brushing hair once EOB. Requires constant support for static sitting balance     Communication Communication Communication: No apparent difficulties   Cognition Arousal: Alert Behavior During Therapy: Agitated Cognition: History of cognitive impairments   Orientation impairments: Time, Situation   Memory impairment (select all impairments): Short-term memory Attention impairment (select first level of impairment): Focused attention Executive functioning impairment (select all impairments): Initiation, Organization, Sequencing, Reasoning, Problem  solving OT - Cognition Comments: significant baseline STM deficits, pt easily agitated during session telling therapy team "you can go to hell" and attempting to hit. Poor participation  overall in session.                 Following commands: Impaired Following commands impaired: Follows one step commands inconsistently      Cueing   Cueing Techniques: Verbal cues, Gestural cues, Tactile cues, Visual cues             Pertinent Vitals/ Pain       Pain Assessment Pain Assessment: Faces Faces Pain Scale: Hurts little more Pain Location: L hip Pain Descriptors / Indicators: Grimacing, Guarding, Contraction Pain Intervention(s): Limited activity within patient's tolerance, Monitored during session, Premedicated before session, Repositioned         Frequency  Min 2X/week        Progress Toward Goals  OT Goals(current goals can now be found in the care plan section)  Progress towards OT goals: Progressing toward goals  Acute Rehab OT Goals OT Goal Formulation: With patient/family Time For Goal Achievement: 10/31/23 Potential to Achieve Goals: Fair  Plan      Co-evaluation    PT/OT/SLP Co-Evaluation/Treatment: Yes Reason for Co-Treatment: For patient/therapist safety;To address functional/ADL transfers PT goals addressed during session: Proper use of DME;Mobility/safety with mobility OT goals addressed during session: ADL's and self-care      AM-PAC OT "6 Clicks" Daily Activity     Outcome Measure   Help from another person eating meals?: A Little Help from another person taking care of personal grooming?: A Little Help from another person toileting, which includes using toliet, bedpan, or urinal?: A Lot Help from another person bathing (including washing, rinsing, drying)?: A Lot Help from another person to put on and taking off regular upper body clothing?: A Little Help from another person to put on and taking off regular lower body clothing?: A Lot 6 Click Score: 15    End of Session Equipment Utilized During Treatment: Rolling walker (2 wheels)  OT Visit Diagnosis: Other abnormalities of gait and mobility (R26.89);Muscle weakness  (generalized) (M62.81)   Activity Tolerance Patient tolerated treatment well   Patient Left in bed;with call bell/phone within reach;with bed alarm set;with family/visitor present   Nurse Communication Mobility status        Time: 9604-5409 OT Time Calculation (min): 24 min  Charges: OT General Charges $OT Visit: 1 Visit OT Treatments $Self Care/Home Management : 8-22 mins  Chamille Werntz L. Nimai Burbach, OTR/L  10/21/23, 2:21 PM

## 2023-10-21 NOTE — Progress Notes (Signed)
  Subjective: 5 Days Post-Op Procedure(s) (LRB): FIXATION, FEMUR, NECK, PERCUTANEOUS, USING SCREW (Left) Patient is sleeping well this morning, no signs of pain. Plan is for d/c to SNF pending bed placement and insurance approval. Negative for chest pain and shortness of breath Fever: no Gastrointestinal:Negative for nausea and vomiting, has had a BM since surgery.  Objective: Vital signs in last 24 hours: Temp:  [98.4 F (36.9 C)-99.6 F (37.6 C)] 99 F (37.2 C) (06/09 0337) Pulse Rate:  [100-115] 100 (06/09 0337) Resp:  [16-18] 18 (06/09 0337) BP: (123-150)/(68-82) 123/73 (06/09 0337) SpO2:  [94 %-97 %] 95 % (06/09 0337)  Intake/Output from previous day:  Intake/Output Summary (Last 24 hours) at 10/21/2023 0716 Last data filed at 10/21/2023 0336 Gross per 24 hour  Intake 655.6 ml  Output 650 ml  Net 5.6 ml    Intake/Output this shift: No intake/output data recorded.  Labs: No results for input(s): "HGB" in the last 72 hours.  No results for input(s): "WBC", "RBC", "HCT", "PLT" in the last 72 hours.  No results for input(s): "NA", "K", "CL", "CO2", "BUN", "CREATININE", "GLUCOSE", "CALCIUM " in the last 72 hours.  No results for input(s): "LABPT", "INR" in the last 72 hours.  EXAM General - Patient is sleeping well this morning, no signs of pain.  Safety mittens in place, h/o cognitive impairment. Extremity - ABD soft Incision: dressing C/D/I No cellulitis present Compartment soft Dressing/Incision - clean, dry, no drainage noted to the left hip honeycomb dressing. Motor Function - intact, moving foot and toes well on exam.  Abdomen soft with intact bowels sounds.  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   Leg cramps    Lymphoma (HCC) 09/14/2015   Monoclonal B cell lymphoma   Night sweats    Osteoporosis    Overweight(278.02)    Obesity   Palpitations    Shortness of breath dyspnea    Tachycardia    Thrombocytopenia (HCC)     Assessment/Plan: 5 Days Post-Op Procedure(s) (LRB): FIXATION, FEMUR, NECK, PERCUTANEOUS, USING SCREW (Left) Principal Problem:   Femur fracture (HCC) Active Problems:   DLBCL (diffuse large B cell lymphoma) (HCC)   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   Weight as of this encounter: 93.2 kg. Advance diet Up with therapy D/C IV fluids when tolerating po intake.  Vitals and labs reviewed this AM. Head CT performed, no acute abnormality.  UA performed. Up with PT.  Patient currently lives at home, may need SNF at discharge. TOC looking for SNF placement Patient has had a BM.  Following discharge, continue Lovenox  40mg  daily for 14 days. Follow-up with [facility] in 10-14 days for staple removal and x-rays of the left hip.  DVT Prophylaxis - Lovenox  and TED hose TTWB to the left leg.  Antoine Bathe, PA-C [facility] 10/21/2023, 7:16 AM

## 2023-10-21 NOTE — Telephone Encounter (Signed)
 FYI

## 2023-10-21 NOTE — Plan of Care (Signed)

## 2023-10-21 NOTE — Progress Notes (Signed)
 Progress Note   Patient: Stacy Murillo NWG:[id number] DOB: 17-Oct-1955 DOA: 10/15/2023     6 DOS: the patient was seen and examined on 10/21/2023   Brief hospital course:  Stacy Murillo is a 68 y.o. 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   Mrs. Stacy Murillo states she and her husband were on the way to dinner, and he was helping her out of the car.  When she went to take her first step onto the ramp, her foot hit the curb and she fell onto her left side.  Due to severe pain and inability to bear weight since then, EMS was called.  No loss of consciousness or head trauma noted.  Ms. Stacy Murillo states that he was holding onto her when she fell and so she did not hit the ground very hard.   ED course: On arrival to the ED, patient is hypertensive at 153/85 with a heart rate of 72.  She was saturating at 100% on room air.  She was afebrile at 97.6.  Initial workup notable for unremarkable CBC, creatinine 1.40 with GFR 41.  Chest x-ray with no active disease.  Hip x-ray with acute nondisplaced fracture through the left femoral neck.  Orthopedic surgery consulted with plans for the OR tomorrow.  TR[facility] contacted for admission.     Assessment and Plan:  Femur fracture [facility]) Patient presents for evaluation of left hip pain following a fall and imaging showed left, nondisplaced femoral neck fracture after a mechanical ground-level fall.   Given low impact, likely secondary to osteoporosis. - Pain control with Tylenol , oxycodone , and Dilaudid  - Patient is status post percutaneous fixation of left femur fracture (POD 4) - WBAT - Appreciate PT evaluation, recommend SNF on discharge     CKD (chronic kidney disease), stage III (HCC) History of CKD stage III with baseline creatinine ranging between 1.1 and 1.4, currently at  baseline.     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  She is currently at baseline. - Delirium precautions - Safety sitter since patient remains confused and not easily directable.   - She is at increased risk for falls   DLBCL (diffuse large B cell lymphoma) (HCC) History of recurrent diffuse large B-cell lymphoma, currently under surveillance only.  - Continue outpatient follow-up with oncology   Acute urinary retention Most likely related to immobility/medication induced Insert Foley catheter Voiding trial prior to discharge Start patient on Flomax 0.4 mg daily       Constipation Continue Miralax  and Senna               Subjective: Had a bowel movement overnight.  Daughter at the bedside.  Physical Exam: Vitals:   10/20/23 1757 10/20/23 2017 10/20/23 2200 10/21/23 0337  BP:  (!) 140/82 131/68 123/73  Pulse: (!) 107 100 (!) 107 100  Resp:  18 18 18   Temp:  99.3 F (37.4 C) 98.7 F (37.1 C) 99 F (37.2 C)  TempSrc:  Oral Oral Oral  SpO2:  94% 96% 95%  Weight:      Height:      {Tip tGeneral: She is not in acute distress.  Appearance: She is normal weight. She is not toxic-appearing.  HENT:     Head: Normocephalic and atraumatic.     Mouth/Throat:     Mouth: Mucous membranes are moist.     Pharynx: Oropharynx is clear.  Cardiovascular:     Rate and Rhythm: Normal rate and regular rhythm.     Pulses:          Dorsalis pedis pulses are 2+ on the right side and 2+ on the left side.     Heart sounds: No murmur heard.    No gallop.  Pulmonary:     Effort: Pulmonary effort is normal. No respiratory distress.     Breath sounds: Normal breath sounds.  Musculoskeletal:     Right lower leg: No edema.     Left  lower leg: No edema.  Decreased range of motion left lower extremity Skin:    General: Skin is warm and dry.  Neurological:     Mental Status: She is alert.     Comments:  Patient is alert and oriented, however unclear on details regarding her medical history.  Known history of cognitive decline.  Psychiatric:        Mood and Affect: Mood normal.        Behavior: Behavior normal.   Data Reviewed:  There are no new results to review at this time.  Family Communication: Plan of care discussed with patient daughter at the bedside  Disposition: Status is: Inpatient Remains inpatient appropriate because: Awaiting nursing home placement  Planned Discharge Destination: Skilled nursing facility    Time spent: 33 minutes  Author: Read Camel, MD 10/21/2023 2:04 PM  For on call review [email or web address].

## 2023-10-22 ENCOUNTER — Other Ambulatory Visit: Payer: Self-pay | Admitting: Internal Medicine

## 2023-10-22 DIAGNOSIS — S72002A Fracture of unspecified part of neck of left femur, initial encounter for closed fracture: Secondary | ICD-10-CM | POA: Diagnosis not present

## 2023-10-22 NOTE — Progress Notes (Signed)
 Occupational Therapy Treatment Patient Details Name: Stacy Murillo MRN: [id number] DOB: [birth year] Today's Date: 10/22/2023   History of present illness 68 y/o female presented to ED on 10/15/23 after fall. Sustained acute nondisplaced fracture through the L femoral neck. S/p L femoral neck screw fixation on 6/4. 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 Pt agreeable to participate, NT assists with mobility efforts as pt continues to require +2 assist. Pt puts forth effort to move RLE to initiate transition from supine to sit, MAX - TOTAL +2 for bed mobility with heavy assist at trunk. Once seated EOB, pt with poor static seated balance, requires constant external support due to posterior / R lateral lean. OT facilitates seated weight shifts to facilitate upright posture and pt engages with LE exercises. Tolerates 15+ minutes seated this date, limited ability to follow commands due to baseline cognitive impairment. OT will continue to progress as able. Discharge recommendation appropriate.       If plan is discharge home, recommend the following:  Two people to help with walking and/or transfers;Two people to help with bathing/dressing/bathroom;Assistance with cooking/housework;Assistance with feeding;Direct supervision/assist for medications management;Direct supervision/assist for financial management;Assist for transportation;Help with stairs or ramp for entrance;Supervision due to cognitive status   Equipment Recommendations  Hospital bed    Recommendations for Other Services      Precautions / Restrictions Precautions Precautions: Fall Recall of Precautions/Restrictions: Impaired Restrictions Weight Bearing Restrictions Per Provider Order: Yes LLE Weight Bearing Per Provider Order: Touchdown weight  bearing       Mobility Bed Mobility Overal bed mobility: Needs Assistance Bed Mobility: Supine to Sit, Sit to Supine     Supine to sit: Max assist, Total assist, +2 for physical assistance, +2 for safety/equipment, HOB elevated, Used rails Sit to supine: Total assist, +2 for physical assistance, Max assist   General bed mobility comments: pt participates by reaching for bed rails, and initiating movement with RLE when given multimodal cuing. significant assist for both LE and trunk throughout    Transfers                   General transfer comment: Transfers not attempted.     Balance Overall balance assessment: Needs assistance Sitting-balance support: Bilateral upper extremity supported, Feet supported Sitting balance-Leahy Scale: Zero Sitting balance - Comments: Requires support at all times for static seated balance. Posterior and R lean, unable to correct. OT facilitates weight shifts to improve orientation to midline. Postural control: Right lateral lean, Posterior lean                                 ADL either performed or assessed with clinical judgement   ADL Overall ADL's : Needs assistance/impaired     Grooming: Sitting;Minimal assistance;Brushing hair Grooming Details (indicate cue type and reason): cousin assisted with brushing hair in sitting                             Functional mobility during ADLs: Total assistance;Maximal assistance;+2 for physical assistance;+2 for safety/equipment;Cueing for safety;Cueing for sequencing General ADL Comments: Session focused on improving tolerance to activity and static sitting balance.     Communication Communication Communication: No apparent difficulties   Cognition Arousal: Alert  Behavior During Therapy: Agitated (pt often agitated, "don't touch me") Cognition: History of cognitive impairments   Orientation impairments: Time, Situation   Memory impairment (select all  impairments): Short-term memory Attention impairment (select first level of impairment): Focused attention Executive functioning impairment (select all impairments): Initiation, Organization, Sequencing, Reasoning, Problem solving OT - Cognition Comments: significant baseline STM and cognitive deficits. Pt with slightly improved participation vs yesterday's session. Family member assits with redirection                 Following commands: Impaired Following commands impaired: Follows one step commands inconsistently      Cueing   Cueing Techniques: Verbal cues, Gestural cues, Tactile cues, Visual cues        General Comments NT and RN in room to assist with mobility efforts. Dressing intact.    Pertinent Vitals/ Pain       Pain Assessment Pain Assessment: PAINAD Breathing: normal Negative Vocalization: occasional moan/groan, low speech, negative/disapproving quality Facial Expression: sad, frightened, frown Body Language: tense, distressed pacing, fidgeting Pain Location: L hip Pain Descriptors / Indicators: Discomfort, Grimacing, Guarding Pain Intervention(s): Limited activity within patient's tolerance, Monitored during session, Premedicated before session, Repositioned         Frequency  Min 2X/week        Progress Toward Goals  OT Goals(current goals can now be found in the care plan section)  Progress towards OT goals: Progressing toward goals  Acute Rehab OT Goals OT Goal Formulation: With patient/family Time For Goal Achievement: 10/31/23 Potential to Achieve Goals: Fair  Plan         AM-PAC OT "6 Clicks" Daily Activity     Outcome Measure   Help from another person eating meals?: A Little Help from another person taking care of personal grooming?: A Little Help from another person toileting, which includes using toliet, bedpan, or urinal?: A Lot Help from another person bathing (including washing, rinsing, drying)?: A Lot Help from another person  to put on and taking off regular upper body clothing?: A Lot Help from another person to put on and taking off regular lower body clothing?: A Lot 6 Click Score: 14    End of Session    OT Visit Diagnosis: Other abnormalities of gait and mobility (R26.89);Muscle weakness (generalized) (M62.81)   Activity Tolerance Patient tolerated treatment well   Patient Left in bed;with call bell/phone within reach;with bed alarm set;with family/visitor present;with nursing/sitter in room   Nurse Communication Mobility status        Time: 1610-9604 OT Time Calculation (min): 27 min  Charges: OT General Charges $OT Visit: 1 Visit OT Treatments $Self Care/Home Management : 8-22 mins $Therapeutic Activity: 8-22 mins  Chrissi Crow L. Layann Bluett, OTR/L  10/22/23, 3:35 PM

## 2023-10-22 NOTE — Progress Notes (Addendum)
 Progress Note   Patient: Stacy Murillo AOZ:[id number] DOB: Jun 25, 1955 DOA: 10/15/2023     7 DOS: the patient was seen and examined on 10/22/2023   Brief hospital course: Stacy Murillo is a 68 y.o. 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   Stacy Murillo states she and her husband were on the way to dinner, and he was helping her out of the car.  When she went to take her first step onto the ramp, her foot hit the curb and she fell onto her left side.  Due to severe pain and inability to bear weight since then, EMS was called.  No loss of consciousness or head trauma noted.  Stacy Murillo states that he was holding onto her when she fell and so she did not hit the ground very hard.   ED course: On arrival to the ED, patient is hypertensive at 153/85 with a heart rate of 72.  She was saturating at 100% on room air.  She was afebrile at 97.6.  Initial workup notable for unremarkable CBC, creatinine 1.40 with GFR 41.  Chest x-ray with no active disease.  Hip x-ray with acute nondisplaced fracture through the left femoral neck.  Orthopedic surgery consulted with plans for the OR tomorrow.  [facility] contacted for admission.     Assessment and Plan:  Femur fracture [facility]) Patient presents for evaluation of left hip pain following a fall and imaging showed left, nondisplaced femoral neck fracture after a mechanical ground-level fall.   Given low impact, likely secondary to osteoporosis. - Pain control with Tylenol , oxycodone , and Dilaudid  - Patient is status post percutaneous fixation of left femur fracture (POD 5) - WBAT - Appreciate PT evaluation, recommend SNF on discharge     CKD (chronic kidney disease), stage III (HCC) History of CKD stage III with baseline creatinine ranging between 1.1 and 1.4, currently at  baseline.     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  She is currently at baseline. - Delirium precautions - Family remains at bedside - Patient not in restraints   DLBCL (diffuse large B cell lymphoma) (HCC) History of recurrent diffuse large B-cell lymphoma, currently under surveillance only.  - Continue outpatient follow-up with oncology   Acute urinary retention Most likely related to immobility/medication induced Voiding trial       Constipation Continue Miralax  and Senna          Subjective: Awake and alert  Physical Exam: Vitals:   10/21/23 0337 10/21/23 2002 10/22/23 0316 10/22/23 0832  BP: 123/73 (!) 139/90 (!) 142/88 127/73  Pulse: 100   96  Resp: 18 17 16 16   Temp: 99 F (37.2 C) 98.6 F (37 C)  98.1 F (36.7 C)  TempSrc: Oral   Oral  SpO2: 95% 99% 94% 96%  Weight:      Height:       General: She is not in acute distress.    Appearance: She is normal weight. She is not toxic-appearing.  HENT:     Head: Normocephalic and atraumatic.     Mouth/Throat:     Mouth: Mucous membranes are moist.  Pharynx: Oropharynx is clear.  Cardiovascular:     Rate and Rhythm: Normal rate and regular rhythm.     Pulses:          Dorsalis pedis pulses are 2+ on the right side and 2+ on the left side.     Heart sounds: No murmur heard.    No gallop.  Pulmonary:     Effort: Pulmonary effort is normal. No respiratory distress.     Breath sounds: Normal breath sounds.  Musculoskeletal:     Right lower leg: No edema.     Left lower leg: No edema.  Decreased range of motion left lower extremity Skin:    General: Skin is warm and dry.  Neurological:     Mental Status: She is alert.     Comments:  Patient is alert  and oriented, however unclear on details regarding her medical history.  Known history of cognitive decline.     Data Reviewed:  There are no new results to review at this time.  Family Communication: Plan of care discussed with patient's daughter at the bedside.  All questions and concerns have been addressed.  Disposition: Status is: Inpatient Remains inpatient appropriate because: Awaiting discharge to SNF  Planned Discharge Destination: Skilled nursing facility    Time spent: 33 minutes  Author: Read Camel, MD 10/22/2023 1:32 PM  For on call review [email or web address].

## 2023-10-22 NOTE — Progress Notes (Signed)
  Subjective: 6 Days Post-Op Procedure(s) (LRB): FIXATION, FEMUR, NECK, PERCUTANEOUS, USING SCREW (Left) Patient is in bed, much more alert this morning. Plan is for d/c to SNF pending bed placement and insurance approval. Negative for chest pain and shortness of breath Fever: no Gastrointestinal:Negative for nausea and vomiting, has had a BM since surgery. Denies any pain in the left hip this morning.  Objective: Vital signs in last 24 hours: Temp:  [98.6 F (37 C)] 98.6 F (37 C) (06/09 2002) Resp:  [16-17] 16 (06/10 0316) BP: (139-142)/(88-90) 142/88 (06/10 0316) SpO2:  [94 %-99 %] 94 % (06/10 0316)  Intake/Output from previous day:  Intake/Output Summary (Last 24 hours) at 10/22/2023 0722 Last data filed at 10/21/2023 0955 Gross per 24 hour  Intake --  Output 700 ml  Net -700 ml    Intake/Output this shift: No intake/output data recorded.  Labs: No results for input(s): "HGB" in the last 72 hours.  No results for input(s): "WBC", "RBC", "HCT", "PLT" in the last 72 hours.  No results for input(s): "NA", "K", "CL", "CO2", "BUN", "CREATININE", "GLUCOSE", "CALCIUM " in the last 72 hours.  No results for input(s): "LABPT", "INR" in the last 72 hours.  EXAM General - Patient is in bed.  Report no pain in the left hip. Extremity - ABD soft Incision: dressing C/D/I No cellulitis present Compartment soft Dressing/Incision - clean, dry, no drainage noted to the left hip honeycomb dressing.  Moderate bruising noted. Motor Function - intact, moving foot and toes well on exam.  Abdomen soft with intact bowels sounds.  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   Leg  cramps    Lymphoma (HCC) 09/14/2015   Monoclonal B cell lymphoma   Night sweats    Osteoporosis    Overweight(278.02)    Obesity   Palpitations    Shortness of breath dyspnea    Tachycardia    Thrombocytopenia (HCC)     Assessment/Plan: 6 Days Post-Op Procedure(s) (LRB): FIXATION, FEMUR, NECK, PERCUTANEOUS, USING SCREW (Left) Principal Problem:   Femur fracture (HCC) Active Problems:   DLBCL (diffuse large B cell lymphoma) (HCC)   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   Weight as of this encounter: 93.2 kg. Advance diet Up with therapy D/C IV fluids when tolerating po intake.  Vitals and labs reviewed this AM. Head CT performed, no acute abnormality.  UA performed. Up with PT.  Patient currently lives at home, may need SNF at discharge. TOC looking for SNF placement Patient has had a BM.  Following discharge, continue Lovenox  40mg  daily for 14 days. Follow-up with [facility] in 10-14 days for staple removal and x-rays of the left hip.  Ortho will sign off at this time.  DVT Prophylaxis - Lovenox  and TED hose TTWB to the left leg.  Antoine Bathe, PA-C [facility] Orthopaedic Surgery 10/22/2023, 7:22 AM

## 2023-10-23 DIAGNOSIS — S72002A Fracture of unspecified part of neck of left femur, initial encounter for closed fracture: Secondary | ICD-10-CM | POA: Diagnosis not present

## 2023-10-23 LAB — CBC
HCT: 35.3 % — ABNORMAL LOW (ref 36.0–46.0)
Hemoglobin: 12.2 g/dL (ref 12.0–15.0)
MCH: 31 pg (ref 26.0–34.0)
MCHC: 34.6 g/dL (ref 30.0–36.0)
MCV: 89.6 fL (ref 80.0–100.0)
Platelets: 190 10*3/uL (ref 150–400)
RBC: 3.94 MIL/uL (ref 3.87–5.11)
RDW: 13.3 % (ref 11.5–15.5)
WBC: 9.1 10*3/uL (ref 4.0–10.5)
nRBC: 0 % (ref 0.0–0.2)

## 2023-10-23 LAB — BASIC METABOLIC PANEL WITH GFR
Anion gap: 9 (ref 5–15)
BUN: 21 mg/dL (ref 8–23)
CO2: 24 mmol/L (ref 22–32)
Calcium: 8.9 mg/dL (ref 8.9–10.3)
Chloride: 99 mmol/L (ref 98–111)
Creatinine, Ser: 1.09 mg/dL — ABNORMAL HIGH (ref 0.44–1.00)
GFR, Estimated: 55 mL/min — ABNORMAL LOW (ref >60)
Glucose, Bld: 96 mg/dL (ref 70–99)
Potassium: 3.9 mmol/L (ref 3.5–5.1)
Sodium: 132 mmol/L — ABNORMAL LOW (ref 135–145)

## 2023-10-23 NOTE — Progress Notes (Addendum)
 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 Oxy prn. TTWB as per ortho surg. PT/OT recs SNF    CKDIIIa: currently IIIa. Cr is trending down from day prior    Orthostatic hypotension: chronic. Holding midodrine  and wil continue to monitor    Dementia: due to malignant neoplasm metastatic to brain. Hx of cognitive decline after undergoing whole brain radiation. Continue w/ supportive care  Diffuse large B cell lymphoma: hx of recurrence but currently under surveillance only. Will need to f/u outpatient w/ onco   Acute urinary retention: likely related to immobility & medication induced   Constipation: continue on miralax , senna      DVT prophylaxis: lovenox  Code Status: full  Family Communication: discussed pt's care w/ pt's family at bedside and answered their questions  Disposition Plan: d/c to SNF tomorrow   Level of care: Med-Surg  Status is: Inpatient Remains inpatient appropriate because: d/c to SNF tomorrow     Consultants:  Ortho surg   Procedures:  Antimicrobials:    Subjective: Pt c/o fatigue  Objective: Vitals:   10/22/23 1500 10/22/23 1944 10/23/23 0526 10/23/23 0740  BP: (!) 145/72 (!) 152/71 (!) 142/85 134/84  Pulse: (!) 107 95 (!) 101 (!) 101  Resp: 16 18 18 17   Temp: 97.6 F (36.4 C) 98.4 F (36.9 C) 98.8 F (37.1 C) 98.5 F (36.9 C)  TempSrc: Oral Oral Oral Oral  SpO2: 99% 98% 95% 96%  Weight:      Height:        Intake/Output Summary (Last 24 hours)  at 10/23/2023 0759 Last data filed at 10/22/2023 2215 Gross per 24 hour  Intake 360 ml  Output 600 ml  Net -240 ml   Filed Weights   10/16/23 0028  Weight: 93.2 kg    Examination:  General exam: Appears calm and comfortable  Respiratory system: Clear to auscultation. Respiratory effort normal. Cardiovascular system: S1 & S2 +. No  rubs, gallops or clicks. Gastrointestinal system: Abdomen is nondistended, soft and nontender. Normal bowel sounds heard. Central nervous system: Alert and awake.  Psychiatry: Judgement and insight appears at baseline. Flat mood and affect    Data Reviewed: I have personally reviewed following labs and imaging studies  CBC: Recent Labs  Lab 10/17/23 0802 10/18/23 0406 10/23/23 0407  WBC 8.9 8.6 9.1  HGB 14.2 12.1 12.2  HCT 42.8 36.3 35.3*  MCV 89.9 90.3 89.6  PLT 122* 128* 190   Basic Metabolic Panel: Recent Labs  Lab 10/17/23 0802 10/23/23 0407  NA 137 132*  K 4.0 3.9  CL 103 99  CO2 22 24  GLUCOSE 134* 96  BUN 16 21  CREATININE 1.34* 1.09*  CALCIUM  9.0 8.9   GFR: Estimated Creatinine Clearance: 51.8 mL/min (A) (by C-G formula based on SCr of 1.09 mg/dL (H)). Liver Function Tests: No results for input(s): AST, ALT, ALKPHOS, BILITOT, PROT, ALBUMIN in the last 168 hours. No results for input(s): LIPASE, AMYLASE in the last 168 hours. No results  for input(s): AMMONIA in the last 168 hours. Coagulation Profile: No results for input(s): INR, PROTIME in the last 168 hours. Cardiac Enzymes: No results for input(s): CKTOTAL, CKMB, CKMBINDEX, TROPONINI in the last 168 hours. BNP (last 3 results) No results for input(s): PROBNP in the last 8760 hours. HbA1C: No results for input(s): HGBA1C in the last 72 hours. CBG: No results for input(s): GLUCAP in the last 168 hours. Lipid Profile: No results for input(s): CHOL, HDL, LDLCALC, TRIG, CHOLHDL, LDLDIRECT in the last 72 hours. Thyroid   Function Tests: No results for input(s): TSH, T4TOTAL, FREET4, T3FREE, THYROIDAB in the last 72 hours. Anemia Panel: No results for input(s): VITAMINB12, FOLATE, FERRITIN, TIBC, IRON, RETICCTPCT in the last 72 hours. Sepsis Labs: No results for input(s): PROCALCITON, LATICACIDVEN in the last 168 hours.  No results found for this or any previous visit (from the past 240 hours).       Radiology Studies: No results found.      Scheduled Meds:  busPIRone   15 mg Oral TID   Chlorhexidine  Gluconate Cloth  6 each Topical Daily   docusate sodium   100 mg Oral BID   enoxaparin  (LOVENOX ) injection  40 mg Subcutaneous Q24H   fesoterodine   4 mg Oral Daily   senna  2 tablet Oral Daily   Continuous Infusions:   LOS: 8 days       Alphonsus Jeans, MD [facility] Pager [phone]  If 7PM-7AM, please contact night-coverage [email or web address] 10/23/2023, 7:59 AM

## 2023-10-23 NOTE — Progress Notes (Signed)
 Physical Therapy Treatment Patient Details Name: Stacy Murillo MRN: [id number] DOB: 21-Mar-1956 Today's Date: 10/23/2023   History of Present Illness 68 y/o female presented to ED on 10/15/23 after fall. Sustained acute nondisplaced fracture through the L femoral neck. S/p L femoral neck screw fixation on 6/4. 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 Required step by step education and cues, able to follow commands with increased time. Did reach for rails for all bed mobility. Still required maxAx2 to complete all mobility tasks. She was able to sit EOB for ~half of session, min-modA due to posterior lean. Able to intermittent correct with multimodal max cues, but unable to maintain >5 seconds. Sit <> stand with maxAx2 and PT assist to maintain LLE NWB, unable to stand >10 seconds. Returned to supine with needs in reach. The patient would benefit from further skilled PT intervention to continue to progress towards goals.     If plan is discharge home, recommend the following: Two people to help with walking and/or transfers;Two people to help with bathing/dressing/bathroom;Assistance with cooking/housework;Direct supervision/assist for financial management;Direct supervision/assist for medications management;Assist for transportation;Help with stairs or ramp for entrance;Supervision due to cognitive status   Can travel by private vehicle     No  Equipment Recommendations  Other (comment) (TBD at next level of care)    Recommendations for Other Services       Precautions / Restrictions Precautions Precautions: Fall Recall of Precautions/Restrictions: Impaired Restrictions Weight Bearing Restrictions Per Provider Order: Yes LLE Weight Bearing Per Provider Order: Touchdown weight bearing     Mobility  Bed Mobility Overal bed  mobility: Needs Assistance Bed Mobility: Supine to Sit, Sit to Supine, Rolling Rolling: Max assist, +2 for physical assistance, Used rails   Supine to sit: Max assist, Total assist, +2 for physical assistance, +2 for safety/equipment, HOB elevated, Used rails Sit to supine: Max assist, +2 for physical assistance   General bed mobility comments: pt does reach for rails and initiate mobility today, but required maxAx2 to complete    Transfers Overall transfer level: Needs assistance Equipment used: Rolling walker (2 wheels) Transfers: Sit to/from Stand Sit to Stand: Max assist, +2 physical assistance           General transfer comment: PT kept LLE NWB for momentary standing    Ambulation/Gait                   Stairs             Wheelchair Mobility     Tilt Bed    Modified Rankin (Stroke Patients Only)       Balance Overall balance assessment: Needs assistance Sitting-balance support: Bilateral upper extremity supported, Feet supported Sitting balance-Leahy Scale: Poor Sitting balance - Comments: min-modA to maintain seated balance, posterior lean throughout but improved from last PT session. intermittently able to correct but unable to maintain     Standing balance-Leahy Scale: Zero                              Communication    Cognition Arousal: Alert Behavior During Therapy: WFL for tasks assessed/performed, Flat affect   PT - Cognitive impairments: History of cognitive impairments  Following commands: Impaired Following commands impaired: Follows one step commands with increased time    Cueing    Exercises      General Comments        Pertinent Vitals/Pain Pain Assessment Pain Assessment: Faces Faces Pain Scale: Hurts even more Pain Location: L hip with bed mobility especially Pain Descriptors / Indicators: Discomfort, Grimacing, Guarding Pain Intervention(s): Limited activity within  patient's tolerance, Monitored during session, Repositioned, Premedicated before session    Home Living                          Prior Function            PT Goals (current goals can now be found in the care plan section) Progress towards PT goals: Progressing toward goals    Frequency    Min 3X/week      PT Plan      Co-evaluation              AM-PAC PT 6 Clicks Mobility   Outcome Measure  Help needed turning from your back to your side while in a flat bed without using bedrails?: Total Help needed moving from lying on your back to sitting on the side of a flat bed without using bedrails?: Total Help needed moving to and from a bed to a chair (including a wheelchair)?: Total Help needed standing up from a chair using your arms (e.g., wheelchair or bedside chair)?: Total Help needed to walk in hospital room?: Total Help needed climbing 3-5 steps with a railing? : Total 6 Click Score: 6    End of Session   Activity Tolerance: Patient tolerated treatment well Patient left: in bed;with call bell/phone within reach;with bed alarm set Nurse Communication: Mobility status PT Visit Diagnosis: Unsteadiness on feet (R26.81);Muscle weakness (generalized) (M62.81);Other abnormalities of gait and mobility (R26.89);History of falling (Z91.81);Difficulty in walking, not elsewhere classified (R26.2)     Time: 4540-9811 PT Time Calculation (min) (ACUTE ONLY): 24 min  Charges:    $Therapeutic Activity: 23-37 mins PT General Charges $$ ACUTE PT VISIT: 1 Visit                     Darien Eden PT, DPT 11:39 AM,10/23/23

## 2023-10-23 NOTE — TOC Progression Note (Signed)
 Transition of Care [facility]) - Progression Note    Patient Details  Name: Stacy Murillo MRN: [id number] Date of Birth: March 22, 1956  Transition of Care [facility]) CM/SW Contact  Alexandra Ice, RN Phone Number: 10/23/2023, 12:09 PM  Clinical Narrative:     TOC received call from [place] at [facility], DON is reviewing patient again and will call back TOC back. Received update from [place] at [facility], that family is stating they want long-term care and she is now looking to see if they will be able to transition her to LTC bed after STR and will update TOC.  Met family and discussed discharge plan. They stated they want LTC, and would like to choose from list provided. TOC explained list provided is for short term rehab and unsure if other facilities will have a LTC available for patient after she receives her short term rehab. They asked TOC to reach out to [facility] as a back up. Per Bonnell Butcher at [facility] may be able to have LTC after patient completes STR.  Received callback from [place] at [facility], they are able to offer patient a LTC bed after receiving short term rehab. They will have a bed for patient tomorrow. TOC met with spouse, Stacy Murillo, at bedside. TOC explained that [facility] is able to provide a LTC bed to patient after she completes her short term rehab days. Facility will have bed available tomorrow. He verbalized understanding.  TOC updated MD and bedside nurse of discharge plan.   Expected Discharge Plan: Skilled Nursing Facility Barriers to Discharge: Continued Medical Work up  Expected Discharge Plan and Services   Discharge Planning Services: CM Consult Post Acute Care Choice: Skilled Nursing Facility Living arrangements for the past 2 months: Single Family Home                   DME Agency: NA       HH Arranged: NA           Social Determinants of Health (SDOH) Interventions SDOH Screenings   Food Insecurity: No Food  Insecurity (10/15/2023)  Housing: Unknown (10/16/2023)  Transportation Needs: No Transportation Needs (10/15/2023)  Utilities: Not At Risk (10/15/2023)  Alcohol Screen: Low Risk  (07/08/2023)  Depression (PHQ2-9): Low Risk  (08/13/2023)  Financial Resource Strain: Low Risk  (07/08/2023)  Physical Activity: Inactive (07/08/2023)  Social Connections: Moderately Isolated (10/15/2023)  Stress: No Stress Concern Present (07/08/2023)  Tobacco Use: Low Risk  (10/16/2023)  Health Literacy: Inadequate Health Literacy (07/08/2023)    Readmission Risk Interventions     No data to display

## 2023-10-24 DIAGNOSIS — S72002A Fracture of unspecified part of neck of left femur, initial encounter for closed fracture: Secondary | ICD-10-CM | POA: Diagnosis not present

## 2023-10-24 LAB — BASIC METABOLIC PANEL WITH GFR
Anion gap: 10 (ref 5–15)
BUN: 20 mg/dL (ref 8–23)
CO2: 23 mmol/L (ref 22–32)
Calcium: 8.7 mg/dL — ABNORMAL LOW (ref 8.9–10.3)
Chloride: 100 mmol/L (ref 98–111)
Creatinine, Ser: 1.01 mg/dL — ABNORMAL HIGH (ref 0.44–1.00)
GFR, Estimated: >60 mL/min (ref >60)
Glucose, Bld: 104 mg/dL — ABNORMAL HIGH (ref 70–99)
Potassium: 3.9 mmol/L (ref 3.5–5.1)
Sodium: 133 mmol/L — ABNORMAL LOW (ref 135–145)

## 2023-10-24 LAB — CBC
HCT: 36 % (ref 36.0–46.0)
Hemoglobin: 11.9 g/dL — ABNORMAL LOW (ref 12.0–15.0)
MCH: 29.8 pg (ref 26.0–34.0)
MCHC: 33.1 g/dL (ref 30.0–36.0)
MCV: 90 fL (ref 80.0–100.0)
Platelets: 222 10*3/uL (ref 150–400)
RBC: 4 MIL/uL (ref 3.87–5.11)
RDW: 13 % (ref 11.5–15.5)
WBC: 8.9 10*3/uL (ref 4.0–10.5)
nRBC: 0 % (ref 0.0–0.2)

## 2023-10-24 NOTE — Plan of Care (Signed)

## 2023-10-24 NOTE — TOC Progression Note (Signed)
 Transition of Care [facility]) - Progression Note    Patient Details  Name: Stacy Murillo MRN: [id number] Date of Birth: [birth year]  [facility]) CM/SW Contact  Alexandra Ice, RN Phone Number: 10/24/2023, 9:01 AM  Clinical Narrative:    Received message from [facility], they are still able to accept patient today. Notified MD.    Expected Discharge Plan: Skilled Nursing Facility Barriers to Discharge: Continued Medical Work up  Expected Discharge Plan and Services   Discharge Planning Services: CM Consult Post Acute Care Choice: Skilled Nursing Facility Living arrangements for the past 2 months: Single Family Home                   DME Agency: NA       HH Arranged: NA           Social Determinants of Health (SDOH) Interventions SDOH Screenings   Food Insecurity: No Food Insecurity (10/15/2023)  Housing: Unknown (10/16/2023)  Transportation Needs: No Transportation Needs (10/15/2023)  Utilities: Not At Risk (10/15/2023)  Alcohol Screen: Low Risk  (07/08/2023)  Depression (PHQ2-9): Low Risk  (08/13/2023)  Financial Resource Strain: Low Risk  (07/08/2023)  Physical Activity: Inactive (07/08/2023)  Social Connections: Moderately Isolated (10/15/2023)  Stress: No Stress Concern Present (07/08/2023)  Tobacco Use: Low Risk  (10/16/2023)  Health Literacy: Inadequate Health Literacy (07/08/2023)    Readmission Risk Interventions     No data to display

## 2023-10-24 NOTE — TOC Transition Note (Signed)
 Transition of Care [facility]) - Discharge Note   Patient Details  Name: Stacy Murillo MRN: [id number] Date of Birth: 1955/09/14  Transition of Care [facility]) CM/SW Contact:  Alexandra Ice, RN Phone Number: 10/24/2023, 12:45 PM   Clinical Narrative:     Patient has discharge order and summary in place, sent to facility via HUB. Notified Investment banker, operational at [facility]. Patient going to room 333, nurse to call report to [phone].  EMS packet printed to nurse station. TOC contacted [facility] and spoke with [place], they will pick up patient now. Notified bedside nurse and MD  Final next level of care: Skilled Nursing Facility Barriers to Discharge: Barriers Resolved   Patient Goals and CMS Choice Patient states their goals for this hospitalization and ongoing recovery are:: get better CMS Medicare.gov Compare Post Acute Care list provided to:: Patient Represenative (must comment) (Spouse: Joe) Choice offered to / list presented to : Spouse      Discharge Placement                Patient to be transferred to facility by: [facility] Name of family member notified: Drexel Gentles Patient and family notified of of transfer: 10/24/23  Discharge Plan and Services Additional resources added to the After Visit Summary for     Discharge Planning Services: CM Consult Post Acute Care Choice: Skilled Nursing Facility            DME Agency: NA       HH Arranged: NA          Social Drivers of Health (SDOH) Interventions SDOH Screenings   Food Insecurity: No Food Insecurity (10/15/2023)  Housing: Unknown (10/16/2023)  Transportation Needs: No Transportation Needs (10/15/2023)  Utilities: Not At Risk (10/15/2023)  Alcohol Screen: Low Risk  (07/08/2023)  Depression (PHQ2-9): Low Risk  (08/13/2023)  Financial Resource Strain: Low Risk  (07/08/2023)  Physical Activity: Inactive (07/08/2023)  Social Connections: Moderately Isolated (10/15/2023)  Stress: No Stress Concern Present (07/08/2023)  Tobacco  Use: Low Risk  (10/16/2023)  Health Literacy: Inadequate Health Literacy (07/08/2023)     Readmission Risk Interventions     No data to display

## 2023-10-24 NOTE — TOC Progression Note (Signed)
 Transition of Care [facility]) - Progression Note    Patient Details  Name: Stacy Murillo [place] MRN: [id number] Date of Birth: Sep 28, 1955  Transition of Care [facility]) CM/SW Contact  Alexandra Ice, RN Phone Number: 10/24/2023, 11:26 AM  Clinical Narrative:    Received message from facility, they will need discharge summary and orders by 1:30pm, in order to accept patient. Met with patient and spouse, Asa Lauth, at bedside, he had questions about discharge for today. He wanted to know if she needed clothes, TOC stated he can bring clothes if he did not want her to travel in hospital gown. He verbalized understanding.  Notified MD.    Expected Discharge Plan: Skilled Nursing Facility Barriers to Discharge: Continued Medical Work up  Expected Discharge Plan and Services   Discharge Planning Services: CM Consult Post Acute Care Choice: Skilled Nursing Facility Living arrangements for the past 2 months: Single Family Home                   DME Agency: NA       HH Arranged: NA           Social Determinants of Health (SDOH) Interventions SDOH Screenings   Food Insecurity: No Food Insecurity (10/15/2023)  Housing: Unknown (10/16/2023)  Transportation Needs: No Transportation Needs (10/15/2023)  Utilities: Not At Risk (10/15/2023)  Alcohol Screen: Low Risk  (07/08/2023)  Depression (PHQ2-9): Low Risk  (08/13/2023)  Financial Resource Strain: Low Risk  (07/08/2023)  Physical Activity: Inactive (07/08/2023)  Social Connections: Moderately Isolated (10/15/2023)  Stress: No Stress Concern Present (07/08/2023)  Tobacco Use: Low Risk  (10/16/2023)  Health Literacy: Inadequate Health Literacy (07/08/2023)    Readmission Risk Interventions     No data to display

## 2023-10-24 NOTE — Plan of Care (Signed)

## 2023-10-24 NOTE — Discharge Summary (Signed)
 Physician Discharge Summary  Stacy Murillo ZOX:[id number] DOB: [birth year] DOA: 10/15/2023  PCP: Thersia Flax, MD  Admit date: 10/15/2023 Discharge date: 10/24/2023  Admitted From: home Disposition:  SNF  Recommendations for Outpatient Follow-up:  Follow up with PCP in 1-2 weeks F/u w/ ortho surg, Dr. Daun Epstein, in 10-14 days  Home Health: no  Equipment/Devices:  Discharge Condition: stable  CODE STATUS: full  Diet recommendation: Heart Healthy  Brief/Interim Summary: HPI was taken from Dr. Guss Legacy: Stacy Murillo is a 68 y.o. 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   [name] states she and her husband were on the way to dinner, and he was helping her out of the car.  When she went to take her first step onto the ramp, her foot hit the curb and she fell onto her left side.  Due to severe pain and inability to bear weight since then, EMS was called.  No loss of consciousness or head trauma noted.  [name] states that he was holding onto her when she fell and so she did not hit the ground very hard.   ED course: On arrival to the ED, patient is hypertensive at 153/85 with a heart rate of 72.  She was saturating at 100% on room air.  She was afebrile at 97.6.  Initial workup notable for unremarkable CBC, creatinine 1.40 with GFR 41.  Chest x-ray with no active disease.  Hip x-ray with acute nondisplaced fracture through the left femoral neck.  Orthopedic surgery consulted with plans for the OR tomorrow.  [facility] contacted for admission.   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 Oxy prn. TTWB as per ortho surg. PT/OT recs SNF    CKDIIIa: currently IIIa. Cr is trending down again today   Orthostatic hypotension: chronic. Holding midodrine  and will continue to monitor    Dementia: due to malignant neoplasm metastatic to brain. Hx of cognitive decline after undergoing whole brain radiation. Continue w/ supportive care  Diffuse large B cell lymphoma: hx of recurrence but currently under surveillance only. Will need to f/u outpatient w/ onco   Acute urinary retention: resolved   Constipation: resolved. Consider bowel regimen while taking narcotics   Discharge Instructions  Discharge Instructions     Diet general   Complete by: As directed    Discharge instructions   Complete by: As directed    F/u w/ ortho surg, Dr. Daun Epstein, in 10-14 days. F/u w/ PCP in 1-2 weeks   Increase activity slowly   Complete by: As directed       Allergies as of 10/24/2023       Reactions   Ondansetron  Hcl Other (See Comments)   Severe constipation    Oxycodone     Biliary colic with opioids s/p cholecystectomy     Rituximab  Other (See Comments)   Pt reports throat tightness and facial swelling and redness during infusion of Rituxan         Medication List  PAUSE taking these medications    midodrine  5 MG tablet Wait to take this until: November 23, 2023 Commonly known as: PROAMATINE  TAKE ONE AND ONE-HALF TABLETS THREE TIMES A DAY       STOP taking these medications    meloxicam  7.5 MG tablet Commonly known as: MOBIC        TAKE these medications    acetaminophen  650 MG CR tablet Commonly known as: TYLENOL  Take 650 mg by mouth every 8 (eight) hours as needed for pain.   busPIRone  15 MG tablet Commonly known as: BUSPAR  TAKE 1 TABLET BY MOUTH 3 TIMES DAILY   enoxaparin  40 MG/0.4ML injection Commonly known as:  LOVENOX  Inject 0.4 mLs (40 mg total) into the skin daily.   fesoterodine  4 MG Tb24 tablet Commonly known as: TOVIAZ  Take 1 tablet (4 mg total) by mouth daily.   oxyCODONE  5 MG immediate release tablet Commonly known as: Oxy IR/ROXICODONE  Take 0.5-1 tablets (2.5-5 mg total) by mouth every 6 (six) hours as needed for moderate pain (pain score 4-6).   triamcinolone  cream 0.1 % Commonly known as: KENALOG  Apply 1 Application topically daily.        Follow-up Information     [name] Clement, PA-C Follow up in 14 day(s).   Specialty: Physician Assistant Why: Staple Removal and x-rays. Contact information: [address] [place] [address] [phone]                Allergies  Allergen Reactions   Ondansetron  Hcl Other (See Comments)    Severe constipation    Oxycodone      Biliary colic with opioids s/p cholecystectomy     Rituximab  Other (See Comments)    Pt reports throat tightness and facial swelling and redness during infusion of Rituxan     Consultations: Ortho surg    Procedures/Studies: CT HEAD WO CONTRAST ( ) Result Date: 10/20/2023 CLINICAL DATA:  Altered mental status EXAM: CT HEAD WITHOUT CONTRAST TECHNIQUE: Contiguous axial images were obtained from the base of the skull through the vertex without intravenous contrast. RADIATION DOSE REDUCTION: This exam was performed according to the departmental dose-optimization program which includes automated exposure control, adjustment of the mA and/or kV according to patient size and/or use of iterative reconstruction technique. COMPARISON:  02/25/2022 FINDINGS: Brain: There is no mass, hemorrhage or extra-axial collection. There is generalized atrophy without lobar predilection. Hypodensity of the white matter. Vascular: No hyperdense vessel or unexpected vascular calcification. Skull: The visualized skull base, calvarium and extracranial soft tissues are normal. Sinuses/Orbits: No fluid levels or  advanced mucosal thickening of the visualized paranasal sinuses. No mastoid or middle ear effusion. Normal orbits. Other: None. IMPRESSION: 1. No acute intracranial abnormality. 2. Generalized atrophy and chronic white matter changes. Electronically Signed   By: Juanetta Nordmann M.D.   On: 10/20/2023 23:06   DG HIP UNILAT WITH PELVIS 2-3 VIEWS LEFT Result Date: 10/16/2023 CLINICAL DATA:  Elective surgery. EXAM: DG HIP (WITH OR WITHOUT PELVIS) 2-3V LEFT COMPARISON:  Preoperative imaging FINDINGS: Four fluoroscopic spot views of the left hip submitted from the operating room. Screws traverse left femoral neck fracture. Fluoroscopy time 1 minutes 25 seconds. Dose 28.5 mGy. IMPRESSION: Intraoperative fluoroscopy during left femoral neck fracture fixation. Electronically Signed   By: Chadwick Colonel M.D.   On: 10/16/2023 18:38   DG C-Arm 1-60 Min-No Report Result Date: 10/16/2023 Fluoroscopy was utilized by the requesting physician.  No radiographic interpretation.   DG Chest Portable 1 View Result Date: 10/15/2023 CLINICAL DATA:  Fall EXAM: PORTABLE CHEST 1 VIEW COMPARISON:  Chest x-ray 02/25/2022 FINDINGS: Left chest port catheter tip ends at the brachiocephalic SVC junction, unchanged. The heart size and mediastinal contours are within normal limits. Both lungs are clear. The visualized skeletal structures are unremarkable. IMPRESSION: No active disease. Electronically Signed   By: Tyron Gallon M.D.   On: 10/15/2023 20:38   DG HIP UNILAT W OR W/O PELVIS 2-3 VIEWS LEFT Result Date: 10/15/2023 CLINICAL DATA:  Fall with left hip pain. EXAM: DG HIP (WITH OR WITHOUT PELVIS) 2-3V LEFT COMPARISON:  Left hip x-ray 07/15/2020 FINDINGS: The bones are osteopenic. There is an acute fracture through the left femoral neck which is nondisplaced. There is no dislocation. Joint spaces are well maintained. Soft tissues are within normal limits. IMPRESSION: Acute nondisplaced fracture through the left femoral neck. Electronically  Signed   By: Tyron Gallon M.D.   On: 10/15/2023 20:37   (Echo, Carotid, EGD, Colonoscopy, ERCP)    Subjective: Pt c/o fatigue    Discharge Exam: Vitals:   10/24/23 0249 10/24/23 0730  BP: 135/80 (!) 144/73  Pulse: 98 93  Resp: 17 16  Temp: 97.8 F (36.6 C) 99.2 F (37.3 C)  SpO2: 98% 97%   Vitals:   10/23/23 1721 10/23/23 2001 10/24/23 0249 10/24/23 0730  BP: 129/72 (!) 147/86 135/80 (!) 144/73  Pulse: (!) 104 (!) 104 98 93  Resp: 16 19 17 16   Temp: 98.2 F (36.8 C) 99 F (37.2 C) 97.8 F (36.6 C) 99.2 F (37.3 C)  TempSrc: Oral     SpO2: 94% 96% 98% 97%  Weight:      Height:        General: Pt is alert, awake, not in acute distress Cardiovascular: S1/S2 +, no rubs, no gallops Respiratory: CTA bilaterally, no wheezing, no rhonchi Abdominal: Soft, NT, ND, bowel sounds + Extremities: no cyanosis    The results of significant diagnostics from this hospitalization (including imaging, microbiology, ancillary and laboratory) are listed below for reference.     Microbiology: No results found for this or any previous visit (from the past 240 hours).   Labs: BNP (last 3 results) No results for input(s): BNP in the last 8760 hours. Basic Metabolic Panel: Recent Labs  Lab 10/23/23 0407 10/24/23 0335  NA 132* 133*  K 3.9 3.9  CL 99 100  CO2 24 23  GLUCOSE 96 104*  BUN 21 20  CREATININE 1.09* 1.01*  CALCIUM  8.9 8.7*   Liver Function Tests: No results for input(s): AST, ALT, ALKPHOS, BILITOT, PROT, ALBUMIN in the last 168 hours. No results for input(s): LIPASE, AMYLASE in the last 168 hours. No results for input(s): AMMONIA in the last 168 hours. CBC: Recent Labs  Lab 10/18/23 0406 10/23/23 0407 10/24/23 0335  WBC 8.6 9.1 8.9  HGB 12.1 12.2 11.9*  HCT 36.3 35.3* 36.0  MCV 90.3 89.6 90.0  PLT 128* 190 222   Cardiac Enzymes: No results for input(s): CKTOTAL, CKMB, CKMBINDEX, TROPONINI in the last 168 hours. BNP: Invalid  input(s): POCBNP CBG: No results for input(s): GLUCAP in the last 168 hours. D-Dimer No results for input(s): DDIMER in the last 72 hours. Hgb A1c No results for input(s): HGBA1C in the last 72 hours. Lipid Profile No results for input(s): CHOL, HDL, LDLCALC, TRIG, CHOLHDL, LDLDIRECT in the last 72 hours. Thyroid  function studies No results for input(s): TSH, T4TOTAL, T3FREE, THYROIDAB in the last 72 hours.  Invalid input(s): FREET3 Anemia work up No results for  input(s): VITAMINB12, FOLATE, FERRITIN, TIBC, IRON, RETICCTPCT in the last 72 hours. Urinalysis    Component Value Date/Time   COLORURINE YELLOW (A) 10/20/2023 1847   APPEARANCEUR CLEAR (A) 10/20/2023 1847   LABSPEC 1.013 10/20/2023 1847   PHURINE 6.0 10/20/2023 1847   GLUCOSEU NEGATIVE 10/20/2023 1847   GLUCOSEU NEGATIVE 08/15/2023 1319   HGBUR SMALL (A) 10/20/2023 1847   BILIRUBINUR NEGATIVE 10/20/2023 1847   KETONESUR NEGATIVE 10/20/2023 1847   PROTEINUR NEGATIVE 10/20/2023 1847   UROBILINOGEN 0.2 08/15/2023 1319   NITRITE NEGATIVE 10/20/2023 1847   LEUKOCYTESUR NEGATIVE 10/20/2023 1847   Sepsis Labs Recent Labs  Lab 10/18/23 0406 10/23/23 0407 10/24/23 0335  WBC 8.6 9.1 8.9   Microbiology No results found for this or any previous visit (from the past 240 hours).   Time coordinating discharge: Over 30 minutes  SIGNED:   Alphonsus Jeans, MD  [facility] 10/24/2023, 12:28 PM Pager   If 7PM-7AM, please contact night-coverage [email or web address]

## 2023-11-08 ENCOUNTER — Telehealth: Payer: Self-pay

## 2023-11-08 ENCOUNTER — Inpatient Hospital Stay

## 2023-11-08 NOTE — Telephone Encounter (Signed)
 Copied from CRM [id number]. Topic: General - Other >> Nov 08, 2023  2:20 PM Thersia C wrote: Reason for CRM: Patient husband called in regarding patient, wanted to let Dr.Tullo know that patient will now be at the [facility], stated that he needs to know the process on how to get her medical records sent over to her new provider there at the clinic , would like to be called back or messaged   [phone] or contact him on mychart

## 2023-12-16 ENCOUNTER — Other Ambulatory Visit: Payer: Self-pay

## 2023-12-16 ENCOUNTER — Emergency Department

## 2023-12-16 ENCOUNTER — Encounter: Payer: Self-pay | Admitting: Internal Medicine

## 2023-12-16 ENCOUNTER — Inpatient Hospital Stay
Admission: EM | Admit: 2023-12-16 | Discharge: 2023-12-19 | DRG: 175 | Disposition: A | Discharge status: Skilled Nursing Facility | Source: Skilled Nursing Facility | Attending: Internal Medicine | Admitting: Internal Medicine

## 2023-12-16 ENCOUNTER — Inpatient Hospital Stay

## 2023-12-16 DIAGNOSIS — R001 Bradycardia, unspecified: Secondary | ICD-10-CM | POA: Diagnosis not present

## 2023-12-16 DIAGNOSIS — W19XXXD Unspecified fall, subsequent encounter: Secondary | ICD-10-CM | POA: Diagnosis present

## 2023-12-16 DIAGNOSIS — R296 Repeated falls: Secondary | ICD-10-CM | POA: Diagnosis present

## 2023-12-16 DIAGNOSIS — J9601 Acute respiratory failure with hypoxia: Secondary | ICD-10-CM | POA: Diagnosis present

## 2023-12-16 DIAGNOSIS — Z800 Family history of malignant neoplasm of digestive organs: Secondary | ICD-10-CM

## 2023-12-16 DIAGNOSIS — I2699 Other pulmonary embolism without acute cor pulmonale: Principal | ICD-10-CM | POA: Diagnosis present

## 2023-12-16 DIAGNOSIS — N390 Urinary tract infection, site not specified: Secondary | ICD-10-CM | POA: Diagnosis present

## 2023-12-16 DIAGNOSIS — J90 Pleural effusion, not elsewhere classified: Secondary | ICD-10-CM | POA: Diagnosis present

## 2023-12-16 DIAGNOSIS — E669 Obesity, unspecified: Secondary | ICD-10-CM | POA: Diagnosis present

## 2023-12-16 DIAGNOSIS — Z9049 Acquired absence of other specified parts of digestive tract: Secondary | ICD-10-CM

## 2023-12-16 DIAGNOSIS — Z807 Family history of other malignant neoplasms of lymphoid, hematopoietic and related tissues: Secondary | ICD-10-CM | POA: Diagnosis not present

## 2023-12-16 DIAGNOSIS — C8330 Diffuse large B-cell lymphoma, unspecified site: Secondary | ICD-10-CM | POA: Diagnosis present

## 2023-12-16 DIAGNOSIS — Z79899 Other long term (current) drug therapy: Secondary | ICD-10-CM

## 2023-12-16 DIAGNOSIS — R4189 Other symptoms and signs involving cognitive functions and awareness: Secondary | ICD-10-CM | POA: Diagnosis present

## 2023-12-16 DIAGNOSIS — F0393 Unspecified dementia, unspecified severity, with mood disturbance: Secondary | ICD-10-CM | POA: Diagnosis present

## 2023-12-16 DIAGNOSIS — Z888 Allergy status to other drugs, medicaments and biological substances status: Secondary | ICD-10-CM

## 2023-12-16 DIAGNOSIS — Z7901 Long term (current) use of anticoagulants: Secondary | ICD-10-CM

## 2023-12-16 DIAGNOSIS — R5381 Other malaise: Secondary | ICD-10-CM | POA: Diagnosis present

## 2023-12-16 DIAGNOSIS — I442 Atrioventricular block, complete: Secondary | ICD-10-CM | POA: Diagnosis present

## 2023-12-16 DIAGNOSIS — Z808 Family history of malignant neoplasm of other organs or systems: Secondary | ICD-10-CM | POA: Diagnosis not present

## 2023-12-16 DIAGNOSIS — C83398 Diffuse large b-cell lymphoma of other extranodal and solid organ sites: Secondary | ICD-10-CM | POA: Diagnosis present

## 2023-12-16 DIAGNOSIS — G9341 Metabolic encephalopathy: Secondary | ICD-10-CM | POA: Diagnosis present

## 2023-12-16 DIAGNOSIS — J189 Pneumonia, unspecified organism: Secondary | ICD-10-CM | POA: Diagnosis not present

## 2023-12-16 DIAGNOSIS — R4182 Altered mental status, unspecified: Principal | ICD-10-CM

## 2023-12-16 DIAGNOSIS — Z885 Allergy status to narcotic agent status: Secondary | ICD-10-CM | POA: Diagnosis not present

## 2023-12-16 DIAGNOSIS — Z87440 Personal history of urinary (tract) infections: Secondary | ICD-10-CM

## 2023-12-16 DIAGNOSIS — A419 Sepsis, unspecified organism: Secondary | ICD-10-CM

## 2023-12-16 DIAGNOSIS — S7292XD Unspecified fracture of left femur, subsequent encounter for closed fracture with routine healing: Secondary | ICD-10-CM

## 2023-12-16 DIAGNOSIS — I9589 Other hypotension: Secondary | ICD-10-CM | POA: Diagnosis present

## 2023-12-16 DIAGNOSIS — Z825 Family history of asthma and other chronic lower respiratory diseases: Secondary | ICD-10-CM

## 2023-12-16 DIAGNOSIS — Z8249 Family history of ischemic heart disease and other diseases of the circulatory system: Secondary | ICD-10-CM

## 2023-12-16 DIAGNOSIS — E876 Hypokalemia: Secondary | ICD-10-CM | POA: Diagnosis present

## 2023-12-16 DIAGNOSIS — M810 Age-related osteoporosis without current pathological fracture: Secondary | ICD-10-CM | POA: Diagnosis present

## 2023-12-16 DIAGNOSIS — Z90710 Acquired absence of both cervix and uterus: Secondary | ICD-10-CM | POA: Diagnosis not present

## 2023-12-16 DIAGNOSIS — I441 Atrioventricular block, second degree: Secondary | ICD-10-CM | POA: Diagnosis not present

## 2023-12-16 LAB — LACTIC ACID, PLASMA: Lactic Acid, Venous: 1.3 mmol/L (ref 0.5–1.9)

## 2023-12-16 LAB — RESPIRATORY PANEL BY PCR

## 2023-12-16 LAB — CBC WITH DIFFERENTIAL/PLATELET
Abs Immature Granulocytes: 0.12 K/uL — ABNORMAL HIGH (ref 0.00–0.07)
Basophils Absolute: 0.1 K/uL (ref 0.0–0.1)
Basophils Relative: 0 %
Eosinophils Absolute: 0 K/uL (ref 0.0–0.5)
Eosinophils Relative: 0 %
HCT: 33.9 % — ABNORMAL LOW (ref 36.0–46.0)
Hemoglobin: 11.4 g/dL — ABNORMAL LOW (ref 12.0–15.0)
Immature Granulocytes: 1 %
Lymphocytes Relative: 8 %
Lymphs Abs: 1.1 K/uL (ref 0.7–4.0)
MCH: 29.9 pg (ref 26.0–34.0)
MCHC: 33.6 g/dL (ref 30.0–36.0)
MCV: 89 fL (ref 80.0–100.0)
Monocytes Absolute: 1.7 K/uL — ABNORMAL HIGH (ref 0.1–1.0)
Monocytes Relative: 12 %
Neutro Abs: 11.7 K/uL — ABNORMAL HIGH (ref 1.7–7.7)
Neutrophils Relative %: 79 %
Platelets: 171 K/uL (ref 150–400)
RBC: 3.81 MIL/uL — ABNORMAL LOW (ref 3.87–5.11)
RDW: 13.5 % (ref 11.5–15.5)
WBC: 14.7 K/uL — ABNORMAL HIGH (ref 4.0–10.5)
nRBC: 0 % (ref 0.0–0.2)

## 2023-12-16 LAB — URINALYSIS, W/ REFLEX TO CULTURE (INFECTION SUSPECTED)
Bilirubin Urine: NEGATIVE
Glucose, UA: NEGATIVE mg/dL
Ketones, ur: 20 mg/dL — AB
Leukocytes,Ua: NEGATIVE
Nitrite: NEGATIVE
Protein, ur: 100 mg/dL — AB
Specific Gravity, Urine: 1.021 (ref 1.005–1.030)
pH: 6 (ref 5.0–8.0)

## 2023-12-16 LAB — COMPREHENSIVE METABOLIC PANEL WITH GFR
ALT: 13 U/L (ref 0–44)
AST: 19 U/L (ref 15–41)
Albumin: 3 g/dL — ABNORMAL LOW (ref 3.5–5.0)
Alkaline Phosphatase: 104 U/L (ref 38–126)
Anion gap: 11 (ref 5–15)
BUN: 12 mg/dL (ref 8–23)
CO2: 26 mmol/L (ref 22–32)
Calcium: 9 mg/dL (ref 8.9–10.3)
Chloride: 97 mmol/L — ABNORMAL LOW (ref 98–111)
Creatinine, Ser: 0.91 mg/dL (ref 0.44–1.00)
GFR, Estimated: >60 mL/min (ref >60)
Glucose, Bld: 130 mg/dL — ABNORMAL HIGH (ref 70–99)
Potassium: 3.5 mmol/L (ref 3.5–5.1)
Sodium: 134 mmol/L — ABNORMAL LOW (ref 135–145)
Total Bilirubin: 1.1 mg/dL (ref 0.0–1.2)
Total Protein: 6.2 g/dL — ABNORMAL LOW (ref 6.5–8.1)

## 2023-12-16 LAB — PROTIME-INR
INR: 1.1 (ref 0.8–1.2)
Prothrombin Time: 14.6 s (ref 11.4–15.2)

## 2023-12-16 LAB — BRAIN NATRIURETIC PEPTIDE: B Natriuretic Peptide: 14.1 pg/mL (ref 0.0–100.0)

## 2023-12-16 LAB — APTT: aPTT: >160 s (ref 24–36)

## 2023-12-16 LAB — TROPONIN I (HIGH SENSITIVITY)
Troponin I (High Sensitivity): 11 ng/L (ref <18)
Troponin I (High Sensitivity): 10 ng/L (ref <18)

## 2023-12-16 LAB — MRSA NEXT GEN BY PCR, NASAL: MRSA by PCR Next Gen: NOT DETECTED

## 2023-12-16 LAB — PROCALCITONIN: Procalcitonin: 0.37 ng/mL

## 2023-12-16 MED ORDER — SODIUM CHLORIDE 0.9 % IV SOLN
2.0000 g | Freq: Three times a day (TID) | INTRAVENOUS | Status: DC
  Administered 2023-12-16 – 2023-12-17 (×2): 2 g via INTRAVENOUS
  Filled 2023-12-16 (×2): qty 12.5

## 2023-12-16 MED ORDER — HEPARIN (PORCINE) 25000 UT/250ML-% IV SOLN
1100.0000 [IU]/h | INTRAVENOUS | Status: DC
  Administered 2023-12-16: 1100 [IU]/h via INTRAVENOUS
  Filled 2023-12-16: qty 250

## 2023-12-16 MED ORDER — IOHEXOL 350 MG/ML SOLN
100.0000 mL | Freq: Once | INTRAVENOUS | Status: AC | PRN
  Administered 2023-12-17: 100 mL via INTRAVENOUS

## 2023-12-16 MED ORDER — ACETAMINOPHEN 500 MG PO TABS
1000.0000 mg | ORAL_TABLET | Freq: Once | ORAL | Status: AC
  Administered 2023-12-16: 1000 mg via ORAL
  Filled 2023-12-16: qty 2

## 2023-12-16 MED ORDER — VANCOMYCIN HCL 1500 MG/300ML IV SOLN
1500.0000 mg | Freq: Once | INTRAVENOUS | Status: AC
  Administered 2023-12-17: 1500 mg via INTRAVENOUS
  Filled 2023-12-16: qty 300

## 2023-12-16 MED ORDER — SODIUM CHLORIDE 0.9 % IV BOLUS
1000.0000 mL | Freq: Once | INTRAVENOUS | Status: AC
  Administered 2023-12-16: 1000 mL via INTRAVENOUS

## 2023-12-16 MED ORDER — VANCOMYCIN HCL IN DEXTROSE 1-5 GM/200ML-% IV SOLN
1000.0000 mg | Freq: Once | INTRAVENOUS | Status: AC
  Administered 2023-12-16: 1000 mg via INTRAVENOUS
  Filled 2023-12-16: qty 200

## 2023-12-16 MED ORDER — SODIUM CHLORIDE 0.9 % IV SOLN
2.0000 g | Freq: Once | INTRAVENOUS | Status: AC
  Administered 2023-12-16: 2 g via INTRAVENOUS
  Filled 2023-12-16: qty 20

## 2023-12-16 MED ORDER — ALBUTEROL SULFATE (2.5 MG/3ML) 0.083% IN NEBU: 2.5000 mg | INHALATION_SOLUTION | RESPIRATORY_TRACT | Status: DC | PRN

## 2023-12-16 MED ORDER — ACETAMINOPHEN 325 MG PO TABS
650.0000 mg | ORAL_TABLET | Freq: Four times a day (QID) | ORAL | Status: DC | PRN
  Administered 2023-12-19: 650 mg via ORAL
  Filled 2023-12-16: qty 2

## 2023-12-16 MED ORDER — VANCOMYCIN HCL IN DEXTROSE 1-5 GM/200ML-% IV SOLN: 1000.0000 mg | INTRAVENOUS | Status: DC

## 2023-12-16 MED ORDER — IOHEXOL 300 MG/ML  SOLN
100.0000 mL | Freq: Once | INTRAMUSCULAR | Status: AC | PRN
  Administered 2023-12-16: 100 mL via INTRAVENOUS

## 2023-12-16 MED ORDER — ACETAMINOPHEN 650 MG RE SUPP: 650.0000 mg | Freq: Four times a day (QID) | RECTAL | Status: DC | PRN

## 2023-12-16 MED ORDER — HEPARIN SODIUM (PORCINE) 5000 UNIT/ML IJ SOLN
4000.0000 [IU] | Freq: Once | INTRAMUSCULAR | Status: AC
  Administered 2023-12-16: 4000 [IU] via INTRAVENOUS
  Filled 2023-12-16: qty 1

## 2023-12-16 MED ORDER — HALOPERIDOL LACTATE 5 MG/ML IJ SOLN
2.0000 mg | Freq: Once | INTRAMUSCULAR | Status: AC
  Administered 2023-12-16: 2 mg via INTRAVENOUS
  Filled 2023-12-16: qty 1

## 2023-12-16 MED ORDER — SODIUM CHLORIDE 0.9 % IV SOLN
500.0000 mg | INTRAVENOUS | Status: DC
  Administered 2023-12-16: 500 mg via INTRAVENOUS
  Filled 2023-12-16: qty 5

## 2023-12-16 MED ORDER — HEPARIN BOLUS VIA INFUSION
4000.0000 [IU] | Freq: Once | INTRAVENOUS | Status: DC
  Filled 2023-12-16: qty 4000

## 2023-12-16 NOTE — H&P (Addendum)
 History and Physical    Stacy Murillo FMW:[id number] DOB: 22-Aug-1955 DOA: 12/16/2023  PCP: Lenon Layman ORN, MD  Patient coming from: home  I have personally briefly reviewed patient's old medical records in [facility]  Chief Complaint: change in MS  HPI: Stacy Murillo is a 68 y.o. 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  Patient now returns to ED with complaint of change in MS BIB EMS from [facility]. Per staff patient has not been eating or drinking for the last few days. She also has interim history of recent dx of UTI for which she was treated with macrobid at the  SNF.  In the field patient temp 99.7   hr 117 , bp 144/78  sat 98 on ra. Per husband last 2-3 days patient has been more lethargic ,he states due to persistence of lethargy he requested she be evaluated in the ED.  Patient is more alert now can answer some questions but does fall off to sleep quickly.History is take from husband as well as chart.    ED Course:  Temp 99.7 tmx 101.7, bp 117/74, hr 115, sat 99%  Wbc 14.7, hgb 11.4,  plt 171 134, K 3.5, CL 97, bicarb 26 glucose 130, cr 0.91  Lactic 1.3  EKG:  sinus tachycardia  Cxr: IMPRESSION: Poor inspiration with minimal right basilar atelectasis.   LJ:ajruzmpj rare  ketones 20 CTH IMPRESSION: Atrophy, chronic microvascular disease.   No acute intracranial abnormality.  CT chest IMPRESSION: 1. Despite not being performed as a dedicated CTA, there is strong suspicion of bilateral pulmonary emboli involving the lobar and segmental branches. This could be confirmed with dedicated CTA of the chest. No signs of right heart strain. 2. Small right pleural effusion with dependent right lower lobe airspace disease which could reflect pneumonia or a  pulmonary infarct. 3. Sigmoid diverticulosis with mild adjacent soft tissue stranding, improved from previous study, possibly reflecting mild recurrent diverticulitis. 4. No other acute findings in the abdomen or pelvis. 5.  Aortic Atherosclerosis (ICD10-I70.0).  tx NS 1L , CTX, haldol  2mg  ,tx 1grams   Review of Systems: As per HPI otherwise 10 point review of systems negative.   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   Leg cramps    Lymphoma (HCC) 09/14/2015   Monoclonal B cell lymphoma   Night sweats    Osteoporosis    Overweight(278.02)    Obesity   Palpitations    Shortness of breath dyspnea    Tachycardia    Thrombocytopenia (HCC)     Past Surgical History:  Procedure Laterality Date   ABDOMINAL HYSTERECTOMY  2005   BONE MARROW BIOPSY  09/14/15   CHOLECYSTECTOMY  1997   COLONOSCOPY  07/05/2014   ESOPHAGOGASTRODUODENOSCOPY  07/05/2014   HIP PINNING,CANNULATED Left 10/16/2023   Procedure: FIXATION, FEMUR, NECK, PERCUTANEOUS, USING SCREW;  Surgeon: Edie Norleen PARAS, MD;  Location: [facility] ORS;  Service: Orthopedics;  Laterality: Left;   INGUINAL LYMPH NODE BIOPSY Right 10/05/2015   Procedure: INGUINAL  LYMPH NODE BIOPSY;  Surgeon: Louanne KANDICE Muse, MD;  Location: [facility] ORS;  Service: General;  Laterality: Right;   PERIPHERAL VASCULAR CATHETERIZATION N/A 09/27/2015   Procedure: Pat Cath Insertion;  Surgeon: Selinda GORMAN Gu, MD;  Location: [facility] INVASIVE CV LAB;  Service: Cardiovascular;  Laterality: N/A;   PORTA CATH REMOVAL Right 06/03/2017   Procedure: PORTA CATH REMOVAL, with venous thrombectomy;  Surgeon: Gu Selinda GORMAN, MD;  Location: [facility] INVASIVE CV LAB;  Service:  Cardiovascular;  Laterality: Right;   PORTACATH PLACEMENT Right      reports that she has never smoked. She has never used smokeless tobacco. She reports that she does not drink alcohol and does not use drugs.  Allergies  Allergen Reactions   Ondansetron  Hcl Other (See Comments)    Severe constipation    Oxycodone      Biliary colic with opioids s/p cholecystectomy     Rituximab  Other (See Comments)    Pt reports throat tightness and facial swelling and redness during infusion of Rituxan     Family History  Problem Relation Age of Onset   Heart disease Father        CABG x 4   Multiple myeloma Father    Hypertension Mother    Pancreatic cancer Mother    Ulcers Mother    Asthma Mother    Brain cancer Maternal Uncle    Multiple myeloma Maternal Uncle    Diabetes Neg Hx    Breast cancer Neg Hx     Prior to Admission medications   Medication Sig Start Date End Date Taking? Authorizing Provider  acetaminophen  (TYLENOL ) 650 MG CR tablet Take 650 mg by mouth every 8 (eight) hours as needed for pain.   Yes [provider]  ARIPiprazole  (ABILIFY ) 5 MG tablet Take 5 mg by mouth daily.   Yes [provider]  busPIRone  (BUSPAR ) 15 MG tablet TAKE 1 TABLET BY MOUTH 3 TIMES DAILY 10/24/23  Yes Marylynn Verneita CROME, MD  fesoterodine  (TOVIAZ ) 4 MG TB24 tablet Take 1 tablet (4 mg total) by mouth daily. 08/27/23  Yes Marylynn Verneita CROME, MD  Menthol -Zinc Oxide 0.44-20.625 % OINT Apply topically 2 (two) times daily.   Yes [provider]  traMADol  (ULTRAM ) 50 MG tablet Take 50 mg by mouth every 6 (six) hours as needed.   Yes [provider]  triamcinolone  cream (KENALOG ) 0.1 % Apply 1 Application topically daily. 08/13/23  Yes Marylynn Verneita CROME, MD  enoxaparin  (LOVENOX ) 40 MG/0.4ML injection Inject 0.4 mLs (40 mg total) into the skin daily. Patient not taking: Reported on 12/16/2023 10/17/23   Kip Lynwood Double, PA-C  meloxicam  (MOBIC ) 7.5 MG tablet  10/02/23   [provider]  midodrine  (PROAMATINE ) 5 MG tablet TAKE ONE AND ONE-HALF TABLETS THREE TIMES A DAY Patient not taking: Reported on 12/16/2023 10/10/23   Brahmanday, Govinda R, MD  oxyCODONE  (OXY IR/ROXICODONE ) 5 MG immediate release tablet Take 0.5-1 tablets (2.5-5 mg total) by mouth every 6 (six) hours as needed for moderate pain (pain score 4-6). Patient not taking: Reported on 12/16/2023 10/17/23   Kip Lynwood Double, PA-C    Physical Exam: Vitals:   12/16/23 1330 12/16/23 1400 12/16/23 1424 12/16/23 1430  BP: 129/86 139/81  (!) 135/98  Pulse: (!) 109 (!) 106  (!) 117  Resp:    19  Temp:   (!) 101.7 F (38.7 C)   TempSrc:   Rectal   SpO2: 100% 100%  100%  Weight:  Height:        Constitutional: NAD, calm, lethargic Vitals:   12/16/23 1330 12/16/23 1400 12/16/23 1424 12/16/23 1430  BP: 129/86 139/81  (!) 135/98  Pulse: (!) 109 (!) 106  (!) 117  Resp:    19  Temp:   (!) 101.7 F (38.7 C)   TempSrc:   Rectal   SpO2: 100% 100%  100%  Weight:      Height:       Eyes: pupils equal, lids and conjunctivae normal ENMT: Mucous membranes are moist. Posterior pharynx clear of any exudate or lesions.Normal dentition.  Neck: normal, supple, no masses, no thyromegaly Respiratory: clear to auscultation bilaterally, no wheezing, no crackles. Normal respiratory effort. No accessory muscle use.  Cardiovascular: Regular rate and rhythm, no murmurs / rubs / gallops. No extremity edema. 2+ pedal pulses.   Abdomen: no tenderness, no masses palpated. No hepatosplenomegaly. Bowel sounds positive.  Musculoskeletal: no clubbing / cyanosis. No joint deformity upper and lower extremities. Good ROM, no contractures. Normal muscle tone.  Skin: no rashes, lesions, ulcers. No induration Neurologic: CNgrossly intact. Sensation intact,  MAE X4 Psychiatric:  somnolent  unable to assess   Labs on Admission: I have personally reviewed following labs and imaging studies  CBC: Recent Labs  Lab  12/16/23 1131  WBC 14.7*  NEUTROABS 11.7*  HGB 11.4*  HCT 33.9*  MCV 89.0  PLT 171   Basic Metabolic Panel: Recent Labs  Lab 12/16/23 1131  NA 134*  K 3.5  CL 97*  CO2 26  GLUCOSE 130*  BUN 12  CREATININE 0.91  CALCIUM  9.0   GFR: Estimated Creatinine Clearance: 62 mL/min (by C-G formula based on SCr of 0.91 mg/dL). Liver Function Tests: Recent Labs  Lab 12/16/23 1131  AST 19  ALT 13  ALKPHOS 104  BILITOT 1.1  PROT 6.2*  ALBUMIN 3.0*   No results for input(s): LIPASE, AMYLASE in the last 168 hours. No results for input(s): AMMONIA in the last 168 hours. Coagulation Profile: Recent Labs  Lab 12/16/23 1131  INR 1.1   Cardiac Enzymes: No results for input(s): CKTOTAL, CKMB, CKMBINDEX, TROPONINI in the last 168 hours. BNP (last 3 results) No results for input(s): PROBNP in the last 8760 hours. HbA1C: No results for input(s): HGBA1C in the last 72 hours. CBG: No results for input(s): GLUCAP in the last 168 hours. Lipid Profile: No results for input(s): CHOL, HDL, LDLCALC, TRIG, CHOLHDL, LDLDIRECT in the last 72 hours. Thyroid  Function Tests: No results for input(s): TSH, T4TOTAL, FREET4, T3FREE, THYROIDAB in the last 72 hours. Anemia Panel: No results for input(s): VITAMINB12, FOLATE, FERRITIN, TIBC, IRON, RETICCTPCT in the last 72 hours. Urine analysis:    Component Value Date/Time   COLORURINE YELLOW (A) 12/16/2023 1158   APPEARANCEUR CLEAR (A) 12/16/2023 1158   LABSPEC 1.021 12/16/2023 1158   PHURINE 6.0 12/16/2023 1158   GLUCOSEU NEGATIVE 12/16/2023 1158   GLUCOSEU NEGATIVE 08/15/2023 1319   HGBUR SMALL (A) 12/16/2023 1158   BILIRUBINUR NEGATIVE 12/16/2023 1158   KETONESUR 20 (A) 12/16/2023 1158   PROTEINUR 100 (A) 12/16/2023 1158   UROBILINOGEN 0.2 08/15/2023 1319   NITRITE NEGATIVE 12/16/2023 1158   LEUKOCYTESUR NEGATIVE 12/16/2023 1158    Radiological Exams on Admission: CT Head Wo  Contrast Result Date: 12/16/2023 CLINICAL DATA:  Mental status change, unknown cause EXAM: CT HEAD WITHOUT CONTRAST TECHNIQUE: Contiguous axial images were obtained from the base of the skull through the vertex without intravenous contrast. RADIATION DOSE REDUCTION: This exam  was performed according to the departmental dose-optimization program which includes automated exposure control, adjustment of the mA and/or kV according to patient size and/or use of iterative reconstruction technique. COMPARISON:  10/20/2023 FINDINGS: Brain: There is atrophy and chronic small vessel disease changes. No acute intracranial abnormality. Specifically, no hemorrhage, hydrocephalus, mass lesion, acute infarction, or significant intracranial injury. Vascular: No hyperdense vessel or unexpected calcification. Skull: No acute calvarial abnormality. Sinuses/Orbits: No acute findings Other: None IMPRESSION: Atrophy, chronic microvascular disease. No acute intracranial abnormality. Electronically Signed   By: Franky Crease M.D.   On: 12/16/2023 12:57   DG Chest Port 1 View Result Date: 12/16/2023 CLINICAL DATA:  Altered mental status.  Clinical concern for sepsis. EXAM: PORTABLE CHEST 1 VIEW COMPARISON:  10/15/2023 FINDINGS: Poor inspiration. Normal-sized heart. Stable left jugular porta catheter with its tip in the region of the brachiocephalic/SVC junction. Minimal right basilar atelectasis. Diffuse osteopenia. Mild lower thoracic spine degenerative changes. Cholecystectomy clips. IMPRESSION: Poor inspiration with minimal right basilar atelectasis. Electronically Signed   By: Elspeth Bathe M.D.   On: 12/16/2023 11:50    EKG: Independently reviewed.  Assessment/Plan  B/L Acute  PE  with acute hypoxic respiratory failure -admit to progressive care  - continue on heparin  drip per protocol  - dedicated CTPE pending  - check bnp, ce , echo   HCAP  -broad spectrum abx -de-escalate as able  -pulmonary toilet  -urine ag,  sputum, f/u on culture data   UTI , POA -  s/p treatment recently  - UA abn mildly  - f/u with urine culture  due to concern for partial treatment -covered by current abx    Acute metabolic encephalopathy on baseline cognitive impairment  -CTH -no acute finding  - will f/ with MRI to be complete in setting of brain mets  - monitor neuro checks - treat underlying acute medical illness     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 - progressive cognitive impairment  Debility  Recurrent falls Left Femur fx POA -supportive care     Chronic hypotension -currently normotensive   DVT prophylaxis:  on heparin   Code Status:  full/ as discussed per husband wishes in event of cardiac arrest  Family Communication: husband at bedside POC discussed Disposition Plan: patient  expected to be admitted greater than 2 midnights  Consults called: consider heme /onc in am   Admission status:  progressive care    Camila DELENA Ned MD [facility]   If 7PM-7AM, please contact night-coverage [email or web address] Password [facility]  12/16/2023, 4:49 PM

## 2023-12-16 NOTE — ED Provider Notes (Signed)
 [facility] Provider Note    Event Date/Time   First MD Initiated Contact with Patient 12/16/23 1126     (approximate)   History   Altered Mental Status   HPI  Stacy Murillo is a 68 y.o. female past medical history significant for dementia, presents to the emergency department for altered mental status.  Recently treated for urinary tract infection and on review of her MAR she was on Macrobid.  Comes in today for worsening altered mental status.  When EMS arrived she was found to have a fever and was tachycardic with soft blood pressures.  Husband states that she has been more altered over the past 3 or 4 days but does have a history of dementia.  Patient unable to give any further history.  Currently in a nursing care facility following left hip fracture.  Not on anticoagulation.     Physical Exam   Triage Vital Signs: ED Triage Vitals [12/16/23 1128]  Encounter Vitals Group     BP      Girls Systolic BP Percentile      Girls Diastolic BP Percentile      Boys Systolic BP Percentile      Boys Diastolic BP Percentile      Pulse      Resp      Temp      Temp src      SpO2      Weight 205 lb 7.5 oz (93.2 kg)     Height 5' 2 (1.575 m)     Head Circumference      Peak Flow      Pain Score      Pain Loc      Pain Education      Exclude from Growth Chart     Most recent vital signs: Vitals:   12/16/23 1424 12/16/23 1430  BP:  (!) 135/98  Pulse:  (!) 117  Resp:  19  Temp: (!) 101.7 F (38.7 C)   SpO2:  100%    Physical Exam Constitutional:      Appearance: She is well-developed.  HENT:     Head: Atraumatic.  Eyes:     Extraocular Movements: Extraocular movements intact.     Conjunctiva/sclera: Conjunctivae normal.     Pupils: Pupils are equal, round, and reactive to light.  Cardiovascular:     Rate and Rhythm: Regular rhythm. Tachycardia present.  Pulmonary:     Effort: No respiratory distress.     Breath sounds: No wheezing.   Abdominal:     General: There is no distension.     Tenderness: There is no abdominal tenderness.  Musculoskeletal:        General: Normal range of motion.     Cervical back: Normal range of motion.  Skin:    General: Skin is warm.     Capillary Refill: Capillary refill takes less than 2 seconds.  Neurological:     General: No focal deficit present.     Mental Status: She is alert. She is disoriented.  Psychiatric:     Comments: Agitated     IMPRESSION / MDM / ASSESSMENT AND PLAN / ED COURSE  I reviewed the triage vital signs and the nursing notes.  On arrival patient was afebrile but tachycardic with a blood pressure 117/74  Differential diagnosis including sepsis, urinary tract infection, viral illness including COVID/influenza, pneumonia, intracranial hemorrhage  Sepsis order set utilized.  Blood cultures obtained.  Given 1 L of  IV fluids, felt that 30 cc/kg of IV fluids may be detrimental given her normotensive and good perfusion, give 1 L and will reevaluate.  Started on IV antibiotics to cover for urinary tract infection given her recent UTI with Macrobid.  On chart review prior urine cultures were sensitive to ceftriaxone  so we will give 2 g of IV ceftriaxone .  EKG  I, Clotilda Punter, the attending physician, personally viewed and interpreted this ECG.   Rate: Normal  Rhythm: Normal sinus  Axis: Normal  Intervals: Normal  ST&T Change: None  No tachycardic or bradycardic dysrhythmias while on cardiac telemetry.  RADIOLOGY CT scan of the head with no signs of acute intracranial hemorrhage.  Chest x-ray with no obvious pneumonia. LABS (all labs ordered are listed, but only abnormal results are displayed) Labs interpreted as -    Labs Reviewed  COMPREHENSIVE METABOLIC PANEL WITH GFR - Abnormal; Notable for the following components:      Result Value   Sodium 134 (*)    Chloride 97 (*)    Glucose, Bld 130 (*)    Total Protein 6.2 (*)    Albumin 3.0 (*)    All  other components within normal limits  CBC WITH DIFFERENTIAL/PLATELET - Abnormal; Notable for the following components:   WBC 14.7 (*)    RBC 3.81 (*)    Hemoglobin 11.4 (*)    HCT 33.9 (*)    Neutro Abs 11.7 (*)    Monocytes Absolute 1.7 (*)    Abs Immature Granulocytes 0.12 (*)    All other components within normal limits  URINALYSIS, W/ REFLEX TO CULTURE (INFECTION SUSPECTED) - Abnormal; Notable for the following components:   Color, Urine YELLOW (*)    APPearance CLEAR (*)    Hgb urine dipstick SMALL (*)    Ketones, ur 20 (*)    Protein, ur 100 (*)    Bacteria, UA RARE (*)    All other components within normal limits  CULTURE, BLOOD (ROUTINE X 2)  CULTURE, BLOOD (ROUTINE X 2)  LACTIC ACID, PLASMA  PROTIME-INR     MDM  Lab work with leukocytosis of 14.7.  Anemia but hemoglobin appears to be stable at 11.4 with a baseline of 11.9.  Normal platelets.  Normal INR.  Creatinine at baseline.  No significant electrolyte abnormality.  Normal lactic acid.  Chest x-ray with no obvious findings of pneumonia.  Patient became febrile while in the emergency department.  Given Tylenol  and another 1 L of IV fluids.  Urine came back and did not appear infected so I added on IV vancomycin  given that she has been in a nursing facility but I do not see a history of MRSA.  Will add on CT scan chest abdomen pelvis with contrast to further evaluate for source of sepsis.  Consulted hospitalist for admission for sepsis and altered mental status.     PROCEDURES:  Critical Care performed: yes  .Critical Care  Performed by: Punter Clotilda, MD Authorized by: Punter Clotilda, MD   Critical care provider statement:    Critical care time (minutes):  30   Critical care time was exclusive of:  Separately billable procedures and treating other patients   Critical care was time spent personally by me on the following activities:  Development of treatment plan with patient or surrogate, discussions with  consultants, evaluation of patient's response to treatment, examination of patient, ordering and review of laboratory studies, ordering and review of radiographic studies, ordering and performing treatments and interventions,  pulse oximetry, re-evaluation of patient's condition and review of old charts   Care discussed with: admitting provider     Patient's presentation is most consistent with acute presentation with potential threat to life or bodily function.   MEDICATIONS ORDERED IN ED: Medications  vancomycin  (VANCOCIN ) IVPB 1000 mg/200 mL premix (has no administration in time range)  sodium chloride  0.9 % bolus 1,000 mL (1,000 mLs Intravenous New Bag/Given 12/16/23 1200)  cefTRIAXone  (ROCEPHIN ) 2 g in sodium chloride  0.9 % 100 mL IVPB (0 g Intravenous Stopped 12/16/23 1324)  haloperidol  lactate (HALDOL ) injection 2 mg (2 mg Intravenous Given 12/16/23 1328)  acetaminophen  (TYLENOL ) tablet 1,000 mg (1,000 mg Oral Given 12/16/23 1452)  iohexol  (OMNIPAQUE ) 300 MG/ML solution 100 mL (100 mLs Intravenous Contrast Given 12/16/23 1610)    FINAL CLINICAL IMPRESSION(S) / ED DIAGNOSES   Final diagnoses:  Altered mental status, unspecified altered mental status type  Sepsis, due to unspecified organism, unspecified whether acute organ dysfunction present [facility])     Rx / DC Orders   ED Discharge Orders     None        Note:  This document was prepared using Dragon voice recognition software and may include unintentional dictation errors.   Suzanne Kirsch, MD 12/16/23 [phone]

## 2023-12-16 NOTE — ED Notes (Signed)
 Cleatus, MD made aware of critical result. VO to contact pharmacy for further instruction.  Pharmacy contacted. No changes needed at this time.

## 2023-12-16 NOTE — ED Notes (Signed)
 Pt's husband is leaving bedside at this time. He is to bed called for any questions/concerns/updates regarding patient. His number is in chart.

## 2023-12-16 NOTE — ED Notes (Signed)
 Pt removed LAC IV. 2 unsuccessful IV attempts on left arm.

## 2023-12-16 NOTE — ED Provider Notes (Signed)
 Clinical Course as of 12/16/23 1714  Mon Dec 16, 2023  1648 Unfortunately I did not receive signout on this patient but was notified by radiology that CT chest abdomen pelvis demonstrated bilateral pulmonary emboli and possible bowel thickening consistent with colitis.  There was no radiographical evidence of right heart strain.  Unfortunately there was not an MD currently signed up for this patient but will attempt to track down the patients accepting hospitalist.  In the meantime I have ordered a cardiac markers and a PTT [HD]  1650 CT Head Wo Contrast No intracranial hemorrhage seen on CT [HD]  1651 Unfortunately there is no family at bedside and patient has a history of dementia.  Apparently this patient presented for altered mental status.  Already received broad-spectrum antibiotics.  Given the lack of bleeding in her head and lack of documentation of prior GI bleeding To consent for heparin  drip but will order such and update the hospitalist once they sees this patient [HD]  1710 Patient's husband at bedside and I did update him.  Patient has no prior history of GI bleeding and I reviewed the indications benefits risks and alternatives of heparin  bolus and drip with him and he voiced understanding and opted to proceed.  We are still awaiting the hospitalist [HD]  1713 I touch base with the hospitalist who will be taking over care [HD]    Clinical Course User Index [HD] Nicholaus Rolland BRAVO, MD    Upon my evaluation, this patient had a high probability of imminent or life-threatening deterioration due to pulmonary embolism requiring heparin  bolus and drip, which required my direct attention, intervention, and personal management.  I have personally provided 32 minutes of critical care time exclusive of time spent on separately billable procedures. Time includes review of laboratory data, radiology results, discussion with consultants, and monitoring for potential decompensation. Interventions were  performed as documented above.    Nicholaus Rolland BRAVO, MD 12/16/23 [phone]

## 2023-12-16 NOTE — Consult Note (Signed)
 ED Pharmacy Antibiotic Sign Off An antibiotic consult was received from an ED provider for vancomycin  per pharmacy dosing for sepsis. A chart review was completed to assess appropriateness.   The following one time order(s) were placed:  vancomycin   Further antibiotic and/or antibiotic pharmacy consults should be ordered by the admitting provider if indicated.   Thank you for allowing pharmacy to be a part of this patient's care.   Cathaleen GORMAN Blanch, [facility]  Clinical Pharmacist 12/16/23 3:36 PM

## 2023-12-16 NOTE — Progress Notes (Signed)
 Pharmacy Antibiotic Note  Stacy Murillo is a 68 y.o. female admitted on 12/16/2023 with pneumonia.  Pharmacy has been consulted for Vancomycin  dosing.  Plan: Pt given Vancomycin  1000 mg once at 1700. Ordered additional 1500 mg once for 0000. Vancomycin  1000 mg IV Q 24 hrs. Goal AUC 400-550. Expected AUC: 502.5 SCr used: 0.91, Vd used: 0.5, BMI: 37.5  Pharmacy will continue to follow and will adjust abx dosing whenever warranted.  Temp (24hrs), Avg:99.6 F (37.6 C), Min:98.6 F (37 C), Max:101.7 F (38.7 C)   Recent Labs  Lab 12/16/23 1131  WBC 14.7*  CREATININE 0.91  LATICACIDVEN 1.3    Estimated Creatinine Clearance: 62 mL/min (by C-G formula based on SCr of 0.91 mg/dL).    Allergies  Allergen Reactions   Ondansetron  Hcl Other (See Comments)    Severe constipation    Oxycodone      Biliary colic with opioids s/p cholecystectomy     Rituximab  Other (See Comments)    Pt reports throat tightness and facial swelling and redness during infusion of Rituxan     Antimicrobials this admission: 8/4 Azithromycin  >> x 5 days 8/4 Ceftriaxone  >> x 5 days 8/4 Vancomycin  >>   Microbiology results: 8/4 BCx: Pending  Thank you for allowing pharmacy to be a part of this patient's care.  Rankin CANDIE Dills, PharmD, [facility] 12/16/2023 11:29 PM

## 2023-12-16 NOTE — ED Triage Notes (Addendum)
 Pt arrives from [facility] via [facility] with C/O AMS pt hasn't been eating or drinking for the last two days. Pt just finished antibiotics (three days ago) for a UTI.  Hx of dementia   99.7 128 CBG 144/78 98 O2 117 bpm

## 2023-12-16 NOTE — Consult Note (Signed)
 PHARMACY - ANTICOAGULATION CONSULT NOTE  Pharmacy Consult for Heparin  Indication: pulmonary embolus  Allergies  Allergen Reactions   Ondansetron  Hcl Other (See Comments)    Severe constipation    Oxycodone      Biliary colic with opioids s/p cholecystectomy     Rituximab  Other (See Comments)    Pt reports throat tightness and facial swelling and redness during infusion of Rituxan     Patient Measurements: Height: 5' 2 (157.5 cm) Weight: 93.2 kg (205 lb 7.5 oz) IBW/kg (Calculated) : 50.1 HEPARIN  DW (KG): 71.8  Vital Signs: Temp: 101.7 F (38.7 C) (08/04 1424) Temp Source: Rectal (08/04 1424) BP: 135/98 (08/04 1430) Pulse Rate: 117 (08/04 1430)  Labs: Recent Labs    12/16/23 1131  HGB 11.4*  HCT 33.9*  PLT 171  LABPROT 14.6  INR 1.1  CREATININE 0.91    Estimated Creatinine Clearance: 62 mL/min (by C-G formula based on SCr of 0.91 mg/dL).   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   Night sweats    Osteoporosis    Overweight(278.02)    Obesity   Palpitations    Shortness of breath dyspnea    Tachycardia    Thrombocytopenia (HCC)     Medications:  (Not in a hospital admission)  Scheduled:   heparin   4,000 Units Intravenous Once   Infusions:   vancomycin  1,000 mg (12/16/23 1700)   PRN:  Anti-infectives (From admission, onward)    Start     Dose/Rate Route Frequency Ordered Stop   12/16/23 1530  vancomycin  (VANCOCIN ) IVPB 1000 mg/200 mL premix        1,000 mg 200 mL/hr over 60 Minutes Intravenous  Once 12/16/23 1528     12/16/23 1145  cefTRIAXone   (ROCEPHIN ) 2 g in sodium chloride  0.9 % 100 mL IVPB        2 g 200 mL/hr over 30 Minutes Intravenous  Once 12/16/23 1132 12/16/23 1324       Assessment: 68 y.o. female past medical history significant for dementia, presents to the emergency department for altered mental status. Currently in a nursing care facility following left hip fracture. CTA: Despite not being performed as a dedicated CTA, there is strong suspicion of bilateral pulmonary emboli involving the lobar and segmental branches. This could be confirmed with dedicated CTA of the chest. No signs of right heart strain. NO DOAC PTA. CBC stable.   Goal of Therapy:  Heparin  level 0.3-0.7 units/ml Monitor platelets by anticoagulation protocol: Yes  08/04 2335 HL >1.1, supratherapeutic   Plan:  Insurance claims handler.  Hold infusion for 1 hour. Restart heparin  infusion at 900 units/hr. Recheck HL in 6 hrs after restart. CBC daily while on heparin .  Rankin CANDIE Dills, PharmD, MBA 12/17/2023 1:33 AM

## 2023-12-16 NOTE — ED Notes (Signed)
 Pt agitated, swinging, and wouldn't let nurses clean her up and start an in and out cath. Dr. Suzanne notified

## 2023-12-17 ENCOUNTER — Inpatient Hospital Stay (HOSPITAL_COMMUNITY)
Admit: 2023-12-17 | Discharge: 2023-12-17 | Disposition: A | Discharge status: Home / Self Care | Attending: Internal Medicine | Admitting: Internal Medicine

## 2023-12-17 ENCOUNTER — Inpatient Hospital Stay

## 2023-12-17 DIAGNOSIS — C8330 Diffuse large B-cell lymphoma, unspecified site: Secondary | ICD-10-CM | POA: Diagnosis not present

## 2023-12-17 DIAGNOSIS — G9341 Metabolic encephalopathy: Secondary | ICD-10-CM | POA: Diagnosis not present

## 2023-12-17 DIAGNOSIS — J9601 Acute respiratory failure with hypoxia: Secondary | ICD-10-CM

## 2023-12-17 DIAGNOSIS — I2699 Other pulmonary embolism without acute cor pulmonale: Secondary | ICD-10-CM | POA: Diagnosis not present

## 2023-12-17 LAB — AMMONIA: Ammonia: <13 umol/L (ref 9–35)

## 2023-12-17 LAB — HEMOGLOBIN A1C
Hgb A1c MFr Bld: 5 % (ref 4.8–5.6)
Mean Plasma Glucose: 97 mg/dL

## 2023-12-17 LAB — CBC
HCT: 32 % — ABNORMAL LOW (ref 36.0–46.0)
Hemoglobin: 10.2 g/dL — ABNORMAL LOW (ref 12.0–15.0)
MCH: 29.1 pg (ref 26.0–34.0)
MCHC: 31.9 g/dL (ref 30.0–36.0)
MCV: 91.4 fL (ref 80.0–100.0)
Platelets: 165 K/uL (ref 150–400)
RBC: 3.5 MIL/uL — ABNORMAL LOW (ref 3.87–5.11)
RDW: 13.5 % (ref 11.5–15.5)
WBC: 10.9 K/uL — ABNORMAL HIGH (ref 4.0–10.5)
nRBC: 0 % (ref 0.0–0.2)

## 2023-12-17 LAB — COMPREHENSIVE METABOLIC PANEL WITH GFR
ALT: 12 U/L (ref 0–44)
AST: 15 U/L (ref 15–41)
Albumin: 3 g/dL — ABNORMAL LOW (ref 3.5–5.0)
Alkaline Phosphatase: 98 U/L (ref 38–126)
Anion gap: 9 (ref 5–15)
BUN: 11 mg/dL (ref 8–23)
CO2: 23 mmol/L (ref 22–32)
Calcium: 8.7 mg/dL — ABNORMAL LOW (ref 8.9–10.3)
Chloride: 104 mmol/L (ref 98–111)
Creatinine, Ser: 0.89 mg/dL (ref 0.44–1.00)
GFR, Estimated: >60 mL/min (ref >60)
Glucose, Bld: 100 mg/dL — ABNORMAL HIGH (ref 70–99)
Potassium: 3.6 mmol/L (ref 3.5–5.1)
Sodium: 136 mmol/L (ref 135–145)
Total Bilirubin: 0.6 mg/dL (ref 0.0–1.2)
Total Protein: 6.1 g/dL — ABNORMAL LOW (ref 6.5–8.1)

## 2023-12-17 LAB — ECHOCARDIOGRAM LIMITED
Height: 62 in
S' Lateral: 2.6 cm
Weight: 3287.5 [oz_av]

## 2023-12-17 LAB — STREP PNEUMONIAE URINARY ANTIGEN: Strep Pneumo Urinary Antigen: NEGATIVE

## 2023-12-17 LAB — HEPARIN LEVEL (UNFRACTIONATED)
Heparin Unfractionated: <0.1 [IU]/mL — ABNORMAL LOW (ref 0.30–0.70)
Heparin Unfractionated: >1.1 [IU]/mL — ABNORMAL HIGH (ref 0.30–0.70)
Heparin Unfractionated: >1.1 [IU]/mL — ABNORMAL HIGH (ref 0.30–0.70)

## 2023-12-17 LAB — HIV ANTIBODY (ROUTINE TESTING W REFLEX): HIV Screen 4th Generation wRfx: NONREACTIVE

## 2023-12-17 MED ORDER — GADOBUTROL 1 MMOL/ML IV SOLN
10.0000 mL | Freq: Once | INTRAVENOUS | Status: AC | PRN
  Administered 2023-12-17: 10 mL via INTRAVENOUS

## 2023-12-17 MED ORDER — HEPARIN BOLUS VIA INFUSION
2000.0000 [IU] | Freq: Once | INTRAVENOUS | Status: AC
  Administered 2023-12-17: 2000 [IU] via INTRAVENOUS
  Filled 2023-12-17: qty 2000

## 2023-12-17 MED ORDER — HEPARIN (PORCINE) 25000 UT/250ML-% IV SOLN
1200.0000 [IU]/h | INTRAVENOUS | Status: DC
  Administered 2023-12-17: 750 [IU]/h via INTRAVENOUS
  Administered 2023-12-18: 1200 [IU]/h via INTRAVENOUS
  Filled 2023-12-17 (×2): qty 250

## 2023-12-17 MED ORDER — HEPARIN (PORCINE) 25000 UT/250ML-% IV SOLN
900.0000 [IU]/h | INTRAVENOUS | Status: DC
  Administered 2023-12-17: 900 [IU]/h via INTRAVENOUS

## 2023-12-17 NOTE — Progress Notes (Addendum)
 OT Cancellation Note  Patient Details Name: Buelah B [place] MRN: [id number] DOB: [birth year]   Cancelled Treatment:    Reason Eval/Treat Not Completed: Patient not medically ready (per chart, imaging showed a PE, started heparin  at 17:05 on 8/4; per protocol, will hold at least 24 hrs prior to initiating OT eval.)  Therisa Sheffield, OTD OTR/L  12/17/23, 8:35 AM

## 2023-12-17 NOTE — Progress Notes (Signed)
 PT Cancellation Note  Patient Details Name: Stacy Murillo MRN: [id number] DOB: Sep 25, 1955   Cancelled Treatment:    Reason Eval/Treat Not Completed: Other (comment). Patient not medically ready (per chart, imaging showed a PE, started heparin  at 17:05 on 8/4; per protocol, will hold at least 24 hrs prior to initiating PT eval.)    Ireoluwa Gorsline 12/17/2023, 1:22 PM Corean Dade, PT, DPT, [facility] [phone]

## 2023-12-17 NOTE — Progress Notes (Signed)
 Progress Note   Patient: Stacy Murillo [place] FMW:[id number] DOB: 30-Apr-1956 DOA: 12/16/2023  DOS: the patient was seen and examined on 12/17/2023   Brief hospital course:  68 y.o. 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  Patient now returns to ED with complaint of change in [place] [facility] EMS from [facility]. Per staff patient has not been eating or drinking for the last few days. She also has interim history of recent dx of UTI for which she was treated with macrobid at the  SNF.  In the field patient temp 99.7   hr 117 , bp 144/78  sat 98 on ra. Per husband last 2-3 days patient has been more lethargic ,he states due to persistence of lethargy he requested she be evaluated in the ED.    Assessment and Plan:  Acute hypoxic respiratory failure - Appears to be resolved at this time.  Patient breathing comfortably on room air.  Acute bilateral pulmonary embolism - CT noting bilateral PE with likely infarct as well.  Continues on heparin  drip for now.  Eventual plan to transition to DOAC.  Patient will need to be on anticoagulation for at least 3 months.  Etiology multifactorial given underlying DLBCL as well as recent hip fracture.  Acute metabolic encephalopathy with underlying cognitive impairment - CT head showing no acute findings.  MRI noting stable advanced atrophy and previous ventriculostomy.  Patient appears to be showing improvement this morning.  Continue to monitor closely.  HCAP unlikely - CT angio chest noted pulmonary embolism subsequent infarcts.  No purulent sputum or cough to suggest pneumonia.  Will discontinue broad-spectrum antibiotic coverage cefepime , azithromycin , vancomycin  for now.  Monitor respiratory function closely.  Urinary tract infection-POA - Recently treated.   Repeat UA looks clear.  Diffuse large Murillo-cell lymphoma - Progressive cognitive impairment.  Follow-up in outpatient setting.  Physical debilitation and muscle weakness - Recent left femur fracture s/p repair.  Recurrent falls.  Will order PT/OT.    Subjective: Patient resting comfortably this morning.  States she feels well.  Not on any supplemental oxygen.  Denies any worsening shortness of breath, chest pain, fever, nausea, vomiting, abdominal pain.    Physical Exam:  Vitals:   12/16/23 2330 12/17/23 0252 12/17/23 0535 12/17/23 1009  BP: 124/67 124/70 123/70 (!) 102/59  Pulse: 95 89 100 95  Resp: 17 18 18 18   Temp: 98.4 F (36.9 C)  98 F (36.7 C) 98.6 F (37 C)  TempSrc: Oral   Oral  SpO2: 97% 98% 100% 98%  Weight:      Height:        GENERAL:  Alert, pleasant, no acute distress  HEENT:  EOMI CARDIOVASCULAR:  RRR, no murmurs appreciated RESPIRATORY:  Clear to auscultation, no wheezing, rales, or rhonchi GASTROINTESTINAL:  Soft, nontender, nondistended EXTREMITIES:  No LE edema bilaterally NEURO:  No new focal deficits appreciated SKIN:  No rashes noted PSYCH:  Appropriate mood and affect     Data Reviewed:  Imaging Studies: MR BRAIN W WO CONTRAST Result Date: 12/17/2023 EXAM: MRI BRAIN WITH AND WITHOUT CONTRAST 12/17/2023 06:09:12 AM TECHNIQUE: Multiplanar multisequence MRI of the head/brain was performed with and without the administration of intravenous contrast. COMPARISON: CT head without contrast 12/16/2023. MR head without and with contrast 07/30/2022. CLINICAL HISTORY: 68 year old female with a history of dementia, CNS lymphoma status post  radiation and chemotherapy, and recent urinary tract infection presents with altered mental status. Not on anticoagulation. FINDINGS: BRAIN AND VENTRICLES: No acute infarct. No acute intracranial hemorrhage. No mass effect or midline shift. No hydrocephalus. The sella is unremarkable. Normal flow voids. No mass or abnormal  enhancement. Advanced atrophy and diffuse confluent T2 and FLAIR hyperintensities bilaterally are similar to the prior exam. Remote hemorrhage in the anterior left frontal lobe along a previous ventriculostomy tract is stable. ORBITS: No acute abnormality. SINUSES: No acute abnormality. BONES AND SOFT TISSUES: Normal bone marrow signal and enhancement. No acute soft tissue abnormality. IMPRESSION: 1. No acute intracranial abnormality. And restricted diffusion or enhancement to suggest recurrent lymphoma. 2. Stable advanced atrophy and diffuse confluent T2/FLAIR hyperintensities bilaterally, similar to the prior exam. This likely reflects the sequela of prior therapy. 3. Stable remote hemorrhage in the anterior left frontal lobe along a previous ventriculostomy tract. Electronically signed by: Lonni Necessary MD 12/17/2023 06:18 AM EDT RP Workstation: [id number]   CT Angio Chest Pulmonary Embolism (PE) W or WO Contrast Result Date: 12/17/2023 EXAM: CTA of the Chest with contrast for PE 12/17/2023 12:19:00 AM TECHNIQUE: CTA of the chest was performed after the administration of intravenous contrast. Multiplanar reformatted images are provided for review. MIP images are provided for review. Automated exposure control, iterative reconstruction, and/or weight based adjustment of the mA/kV was utilized to reduce the radiation dose to as low as reasonably achievable. COMPARISON: CT earlier today at 04:15 pm. CLINICAL HISTORY: Pulmonary embolism (PE) suspected, high prob. FINDINGS: PULMONARY ARTERIES: Right upper, medial, and lower lobe segmental and lobar pulmonary emboli. Segmental pulmonary emboli in the left lower lobe. These are not significantly changed since CT earlier today. MEDIASTINUM: No evidence of right heart strain (RV/LV ratio less than 1). Left IJ cbc tip in the mid SVC. LYMPH NODES: No mediastinal, hilar or axillary lymphadenopathy. LUNGS AND PLEURA: Small right pleural effusion. Consolidative and  ground glass opacities in the right lower lobe suspicious for infarcts. Could appear similarly in the left lower lobe atelectasis. No pneumothorax. UPPER ABDOMEN: Limited images of the upper abdomen are unremarkable. SOFT TISSUES AND BONES: No acute bone or soft tissue abnormality. IMPRESSION: 1. Redemonstrated bilateral lobar and segmental pulmonary emboli similar to CT earlier today. 2. No evidence of right heart strain (RV/LV ratio less than 1). 3. Consolidative and ground glass opacities in the right lower lobe suspicious for infarcts. Pneumonia could appear similarly. 4. Small right pleural effusion. Electronically signed by: Norman Gatlin MD 12/17/2023 12:34 AM EDT RP Workstation: [id number]   CT CHEST ABDOMEN PELVIS W CONTRAST Result Date: 12/16/2023 CLINICAL DATA:  Sepsis Pt arrives from [facility] via [facility] with C/O AMS pt hasn't been eating or drinking for the last two days. Pt just finished antibiotics (three days ago) for a UTI. EXAM: CT CHEST, ABDOMEN, AND PELVIS WITH CONTRAST TECHNIQUE: Multidetector CT imaging of the chest, abdomen and pelvis was performed following the standard protocol during bolus administration of intravenous contrast. RADIATION DOSE REDUCTION: This exam was performed according to the departmental dose-optimization program which includes automated exposure control, adjustment of the mA and/or kV according to patient size and/or use of iterative reconstruction technique. CONTRAST:  OMNIPAQUE  IOHEXOL  300 MG/ML  SOLN COMPARISON:  Chest radiographs 12/16/2023 and 10/15/2023. Abdominopelvic CT 02/15/2017. FINDINGS: CT CHEST FINDINGS Cardiovascular: Left IJ Port-A-Cath extends to the superior SVC level. There is minimal atherosclerosis of the aorta and great vessels. No acute systemic arterial abnormalities are identified. However, despite not being performed as  a dedicated CTA, there is strong suspicion of bilateral pulmonary emboli involving the lobar and segmental branches.  The heart size is normal. There is no pericardial effusion. Mediastinum/Nodes: There are no enlarged mediastinal, hilar or axillary lymph nodes. The thyroid  gland, trachea and esophagus demonstrate no significant findings. Lungs/Pleura: Small right pleural effusion with dependent right lower lobe airspace disease which could reflect pneumonia or a pulmonary infarct. There is mild dependent atelectasis in the left lower lobe. Musculoskeletal/Chest wall: No chest wall mass or suspicious osseous findings. CT ABDOMEN AND PELVIS FINDINGS Hepatobiliary: The liver is normal in density without suspicious focal abnormality. Stable mild extrahepatic biliary dilatation post cholecystectomy. Pancreas: Unremarkable. No pancreatic ductal dilatation or surrounding inflammatory changes. Spleen: Normal in size without focal abnormality. Adrenals/Urinary Tract: Both adrenal glands appear normal. No evidence of urinary tract calculus, suspicious renal lesion or hydronephrosis. Mild left renal cortical scarring. The bladder appears unremarkable for its degree of distention. Stomach/Bowel: No enteric contrast administered. The stomach appears unremarkable for its degree of distension. No small bowel distension, wall thickening or surrounding inflammation. There are diverticular changes within the sigmoid colon with mild adjacent soft tissue stranding, improved from previous study. The appendix appears normal. Vascular/Lymphatic: There are no enlarged abdominal or pelvic lymph nodes. Mild aortic and branch vessel atherosclerosis without evidence of aneurysm or large vessel occlusion. No pelvic vein DVT identified. Reproductive: Status post hysterectomy.  No adnexal mass. Other: Mild diastasis of the rectus abdominus muscles at the level of the umbilicus. No significant hernia. No ascites or pneumoperitoneum. Musculoskeletal: No acute or significant osseous findings. Interval left hip pinning. Mild degenerative changes in the spine.  IMPRESSION: 1. Despite not being performed as a dedicated CTA, there is strong suspicion of bilateral pulmonary emboli involving the lobar and segmental branches. This could be confirmed with dedicated CTA of the chest. No signs of right heart strain. 2. Small right pleural effusion with dependent right lower lobe airspace disease which could reflect pneumonia or a pulmonary infarct. 3. Sigmoid diverticulosis with mild adjacent soft tissue stranding, improved from previous study, possibly reflecting mild recurrent diverticulitis. 4. No other acute findings in the abdomen or pelvis. 5.  Aortic Atherosclerosis (ICD10-I70.0). 6. Critical Value/emergent results were called by telephone at the time of interpretation on 12/16/2023 at 4:47 pm to Dr Nicholaus in the [facility] ED, who verbally acknowledged these results. Electronically Signed   By: Elsie Perone M.D.   On: 12/16/2023 16:48   CT Head Wo Contrast Result Date: 12/16/2023 CLINICAL DATA:  Mental status change, unknown cause EXAM: CT HEAD WITHOUT CONTRAST TECHNIQUE: Contiguous axial images were obtained from the base of the skull through the vertex without intravenous contrast. RADIATION DOSE REDUCTION: This exam was performed according to the departmental dose-optimization program which includes automated exposure control, adjustment of the mA and/or kV according to patient size and/or use of iterative reconstruction technique. COMPARISON:  10/20/2023 FINDINGS: Brain: There is atrophy and chronic small vessel disease changes. No acute intracranial abnormality. Specifically, no hemorrhage, hydrocephalus, mass lesion, acute infarction, or significant intracranial injury. Vascular: No hyperdense vessel or unexpected calcification. Skull: No acute calvarial abnormality. Sinuses/Orbits: No acute findings Other: None IMPRESSION: Atrophy, chronic microvascular disease. No acute intracranial abnormality. Electronically Signed   By: Franky Crease M.D.   On: 12/16/2023 12:57    DG Chest Port 1 View Result Date: 12/16/2023 CLINICAL DATA:  Altered mental status.  Clinical concern for sepsis. EXAM: PORTABLE CHEST 1 VIEW COMPARISON:  10/15/2023 FINDINGS: Poor inspiration. Normal-sized heart. Stable  left jugular porta catheter with its tip in the region of the brachiocephalic/SVC junction. Minimal right basilar atelectasis. Diffuse osteopenia. Mild lower thoracic spine degenerative changes. Cholecystectomy clips. IMPRESSION: Poor inspiration with minimal right basilar atelectasis. Electronically Signed   By: Elspeth Bathe M.D.   On: 12/16/2023 11:50    There are no new results to review at this time.  Previous records (including but not limited to H&P, progress notes, nursing notes, TOC management) were reviewed in assessment of this patient.  Labs: CBC: Recent Labs  Lab 12/16/23 1131 12/17/23 0527  WBC 14.7* 10.9*  NEUTROABS 11.7*  --   HGB 11.4* 10.2*  HCT 33.9* 32.0*  MCV 89.0 91.4  PLT 171 165   Basic Metabolic Panel: Recent Labs  Lab 12/16/23 1131 12/17/23 0527  NA 134* 136  K 3.5 3.6  CL 97* 104  CO2 26 23  GLUCOSE 130* 100*  BUN 12 11  CREATININE 0.91 0.89  CALCIUM  9.0 8.7*   Liver Function Tests: Recent Labs  Lab 12/16/23 1131 12/17/23 0527  AST 19 15  ALT 13 12  ALKPHOS 104 98  BILITOT 1.1 0.6  PROT 6.2* 6.1*  ALBUMIN 3.0* 3.0*   CBG: No results for input(s): GLUCAP in the last 168 hours.  Scheduled Meds: Continuous Infusions:  azithromycin  Stopped (12/16/23 2225)   ceFEPime  (MAXIPIME ) IV Stopped (12/17/23 0600)   heparin      [START ON 12/18/2023] vancomycin      PRN Meds:.acetaminophen  **OR** acetaminophen , albuterol   Family Communication: None at bedside  Disposition: Status is: Inpatient Remains inpatient appropriate because: Bilateral pulmonary embolism     Time spent: 50 minutes  Length of inpatient stay: 1 days  Author: Carliss LELON Canales, DO 12/17/2023 11:42 AM  For on call review [email or web address].

## 2023-12-17 NOTE — Hospital Course (Signed)
 68 y.o. 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  Patient now returns to ED with complaint of change in [place] [facility] EMS from [facility]. Per staff patient has not been eating or drinking for the last few days. She also has interim history of recent dx of UTI for which she was treated with macrobid at the  SNF.  In the field patient temp 99.7   hr 117 , bp 144/78  sat 98 on ra. Per husband last 2-3 days patient has been more lethargic ,he states due to persistence of lethargy he requested she be evaluated in the ED.     Assessment and Plan:   Acute hypoxic respiratory failure - Appears to be resolved at this time.  Patient breathing comfortably on room air.   Acute bilateral pulmonary embolism - CT noting bilateral PE with likely infarct as well.  Continues on heparin  drip for now.  Eventual plan to transition to DOAC.  Patient will need to be on anticoagulation for at least 3 months.  Etiology multifactorial given underlying DLBCL as well as recent hip fracture.   Acute metabolic encephalopathy with underlying cognitive impairment - CT head showing no acute findings.  MRI noting stable advanced atrophy and previous ventriculostomy.  Patient appears to be showing improvement this morning.  Continue to monitor closely.   HCAP unlikely - CT angio chest noted pulmonary embolism subsequent infarcts.  No purulent sputum or cough to suggest pneumonia.  Will discontinue broad-spectrum antibiotic coverage cefepime , azithromycin , vancomycin  for now.  Monitor respiratory function closely.   Urinary tract infection-POA - Recently treated.  Repeat UA looks clear.   Diffuse large B-cell lymphoma - Progressive cognitive impairment.  Follow-up in outpatient setting.   Physical debilitation and  muscle weakness - Recent left femur fracture s/p repair.  Recurrent falls.  Will order PT/OT.

## 2023-12-17 NOTE — Progress Notes (Addendum)
 PHARMACY - ANTICOAGULATION CONSULT NOTE  Pharmacy Consult for IV heparin  dosage adjustment  Indication: pulmonary embolus  Allergies  Allergen Reactions   Ondansetron  Hcl Other (See Comments)    Severe constipation    Oxycodone      Biliary colic with opioids s/p cholecystectomy     Rituximab  Other (See Comments)    Pt reports throat tightness and facial swelling and redness during infusion of Rituxan     Patient Measurements: Height: 5' 2 (157.5 cm) Weight: 93.2 kg (205 lb 7.5 oz) IBW/kg (Calculated) : 50.1 HEPARIN  DW (KG): 71.8  Vital Signs: Temp: 98.6 F (37 C) (08/05 1009) Temp Source: Oral (08/05 1009) BP: 102/59 (08/05 1009) Pulse Rate: 95 (08/05 1009)  Labs: Recent Labs    12/16/23 1131 12/16/23 1745 12/16/23 1854 12/16/23 2335 12/17/23 0527 12/17/23 1012  HGB 11.4*  --   --   --  10.2*  --   HCT 33.9*  --   --   --  32.0*  --   PLT 171  --   --   --  165  --   APTT  --  >160*  --   --   --   --   LABPROT 14.6  --   --   --   --   --   INR 1.1  --   --   --   --   --   HEPARINUNFRC  --   --   --  >1.10*  --  >1.10*  CREATININE 0.91  --   --   --  0.89  --   TROPONINIHS  --  11 10  --   --   --     Estimated Creatinine Clearance: 63.4 mL/min (by C-G formula based on SCr of 0.89 mg/dL).   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 Notable PMH DVT, lymphoma, HLD, dementia, and hypotension. Renal function 0.89 mg/dL (CrCl 36.5 mL/min). CBC notable for hemoglobin trending down to 10.2 g/dL (88.5 on 8/4). Platelet count normal at 165 K/uL. Heparin  level this AM was > 1.1, and is third level above 1.1. Currently receiving 900 units/hr infusion, which was previously infused at 1,100 units/hr.   Goal of Therapy:  Heparin  level 0.3-0.7 units/ml Monitor platelets by anticoagulation protocol: Yes   Plan:  Hold heparin  infusion for 1 hour. Resume heparin  infusion at rate of 750 units/hr. Monitor heparin  level in 6 hours. Continue to monitor CBC daily for hemoglobin and platelet count.   Landrey Mahurin [place] - PharmD Candidate  12/17/2023,10:45 AM

## 2023-12-17 NOTE — ED Notes (Signed)
 Pt to radiology.

## 2023-12-17 NOTE — Progress Notes (Signed)
 PHARMACY - ANTICOAGULATION CONSULT NOTE  Pharmacy Consult for IV heparin  dosage adjustment  Indication: pulmonary embolus  Allergies  Allergen Reactions   Ondansetron  Hcl Other (See Comments)    Severe constipation    Oxycodone      Biliary colic with opioids s/p cholecystectomy     Rituximab  Other (See Comments)    Pt reports throat tightness and facial swelling and redness during infusion of Rituxan     Patient Measurements: Height: 5' 2 (157.5 cm) Weight: 93.2 kg (205 lb 7.5 oz) IBW/kg (Calculated) : 50.1 HEPARIN  DW (KG): 71.8  Vital Signs: Temp: 98.9 F (37.2 C) (08/05 1725) Temp Source: Oral (08/05 1725) BP: 109/70 (08/05 1725) Pulse Rate: 109 (08/05 1725)  Labs: Recent Labs    12/16/23 1131 12/16/23 1745 12/16/23 1854 12/16/23 2335 12/17/23 0527 12/17/23 1012 12/17/23 1827  HGB 11.4*  --   --   --  10.2*  --   --   HCT 33.9*  --   --   --  32.0*  --   --   PLT 171  --   --   --  165  --   --   APTT  --  >160*  --   --   --   --   --   LABPROT 14.6  --   --   --   --   --   --   INR 1.1  --   --   --   --   --   --   HEPARINUNFRC  --   --   --  >1.10*  --  >1.10* <0.10*  CREATININE 0.91  --   --   --  0.89  --   --   TROPONINIHS  --  11 10  --   --   --   --     Estimated Creatinine Clearance: 63.4 mL/min (by C-G formula based on SCr of 0.89 mg/dL).   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 Notable PMH DVT, lymphoma, HLD, dementia, and hypotension. Renal function 0.89 mg/dL (CrCl 36.5 mL/min). CBC notable for hemoglobin trending down to 10.2 g/dL (88.5 on 8/4). Platelet count normal at 165 K/uL. Heparin  level this AM was > 1.1, and is third level above 1.1. Currently receiving 900 units/hr infusion, which was previously infused at 1,100 units/hr.   8/5 1827 HL < 0.1 - heparin  was still running not sure if it was stopped at any point.   Goal of Therapy:  Heparin  level 0.3-0.7 units/ml Monitor platelets by anticoagulation protocol: Yes   Plan:  Heparin  level is subtherapeutic. Will give heparin  bolus of 2000 units x  1 and increase heparin  infusion to 850 units/hr. Recheck heparin  level in 6 hours. CBC daily while on heparin .   Cathaleen GORMAN Blanch - PharmD 12/17/2023,7:36 PM

## 2023-12-17 NOTE — ED Notes (Signed)
 Pharmacy called and advised heparin  gtt  be pasued for 1 hour. Heparin  gtt paused at this time.

## 2023-12-17 NOTE — Evaluation (Signed)
 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   Leg cramps    Lymphoma (HCC) 09/14/2015   Monoclonal B cell lymphoma   Night sweats    Osteoporosis    Overweight(278.02)    Obesity   Palpitations    Shortness of breath dyspnea    Tachycardia    Thrombocytopenia (HCC)    Past Surgical History:  Past Surgical History:  Procedure Laterality Date   ABDOMINAL HYSTERECTOMY  2005   BONE MARROW BIOPSY  09/14/15   CHOLECYSTECTOMY  1997   COLONOSCOPY  07/05/2014   ESOPHAGOGASTRODUODENOSCOPY  07/05/2014   HIP PINNING,CANNULATED Left 10/16/2023   Procedure: FIXATION, FEMUR, NECK, PERCUTANEOUS, USING SCREW;  Surgeon: Edie Norleen PARAS, MD;  Location: [facility] ORS;  Service: Orthopedics;  Laterality: Left;   INGUINAL LYMPH NODE BIOPSY Right 10/05/2015   Procedure: INGUINAL LYMPH NODE BIOPSY;  Surgeon: Louanne KANDICE Muse, MD;  Location: [facility] ORS;  Service: General;  Laterality: Right;   PERIPHERAL VASCULAR CATHETERIZATION N/A 09/27/2015   Procedure: Pat Cath Insertion;  Surgeon: Selinda GORMAN Gu, MD;  Location: [facility] INVASIVE CV LAB;  Service: Cardiovascular;  Laterality: N/A;   PORTA CATH REMOVAL Right 06/03/2017   Procedure: PORTA CATH REMOVAL, with venous thrombectomy;  Surgeon: Gu Selinda GORMAN, MD;  Location: [facility] INVASIVE CV LAB;  Service: Cardiovascular;  Laterality: Right;   PORTACATH PLACEMENT Right    HPI:  Per  admitting H&P Stacy Murillo is a 68 y.o. 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  Patient now returns to ED with complaint of change in [place] BIB EMS from [facility]. Per staff patient has not been eating or drinking for the last few days. She also has interim history of recent dx of UTI for which she was treated with macrobid at the  SNF.  In the field patient temp 99.7   hr 117 , bp 144/78  sat 98 on ra. Per husband last 2-3 days patient has been more lethargic ,he states due to persistence of lethargy he requested she be evaluated in the ED.  Patient is more alert now can answer some questions but does fall off to sleep quickly.History is take from husband as well as chart.    Assessment / Plan / Recommendation  Clinical Impression  Pt presents with mild-moderate oral phase dysphagia. No overt s/s of aspiration with any tested consistency. Pt had some difficulty following directions for a thorough oral-motor assessment, although no significant weakness was noted when assessing structures during meal today. Oral phase of swallow was adequate for thin liquids and soft solids with no leakage and minimal oral residue. Noted oral tranist delay with solid or dry foods although Pt was able to clear any residue given extended time and alternating sips of liquid. Vocal  quality remained claer throughout assessment. Rec diet changeto Dys 3 soft foods with chopped meats for ease of chewing and in hopes of improving PO intake. Aspiration precautions and diet discussed with husband. Once Pt is back to baseline or discharged  to [facility], she may continue with her previous diet as tolerated with no new restrictions. SLP Visit Diagnosis: Dysphagia, oral phase (R13.11)    Aspiration Risk  Mild aspiration  risk    Diet Recommendation Dysphagia 3 (Mech soft)    Liquid Administration via: Cup;Straw Medication Administration: Other (Comment) (as tolerated) Supervision: Staff to assist with self feeding;Full supervision/cueing for compensatory strategies Compensations: Minimize environmental distractions;Slow rate;Small sips/bites;Follow solids with liquid Postural Changes: Seated upright at 90 degrees    Other  Recommendations Oral Care Recommendations: Oral care BID     Assistance Recommended at Discharge  Plans to go back to the [facility]  Functional Status Assessment    Frequency and Duration min 2x/week  1 week       Prognosis Prognosis for improved oropharyngeal function: Good Barriers to Reach Goals: Cognitive deficits      Swallow Study   General Date of Onset: 12/16/23 HPI: Per admitting H&P Stacy Murillo is a 69 y.o. 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  Patient now returns to ED with complaint of change in [place] BIB EMS from [facility]. Per staff patient has not been eating or drinking for the last few days. She also has interim history of recent dx of UTI for which she was treated with macrobid at the  SNF.  In the field patient temp 99.7   hr 117 , bp 144/78  sat 98 on ra. Per husband last 2-3 days patient has been more lethargic ,he states due to persistence of lethargy he requested she be evaluated in the ED.  Patient is more alert now can answer some questions but does fall off to sleep quickly.History is take from husband as well as chart. Type of Study: Bedside Swallow Evaluation Diet Prior to this Study: Regular Respiratory Status: Room air History of Recent Intubation: No Behavior/Cognition: Alert;Cooperative;Pleasant mood;Confused Oral  Cavity Assessment: Other (comment) (Pt would not open fully for thorough assessment) Oral Care Completed by SLP: No Oral Cavity - Dentition: Adequate natural dentition Vision: Functional for self-feeding Self-Feeding Abilities: Needs assist Patient Positioning: Upright in bed Baseline Vocal Quality: Normal Volitional Swallow: Unable to elicit    Oral/Motor/Sensory Function Overall Oral Motor/Sensory Function: Mild impairment   Ice Chips Ice chips: Within functional limits Presentation: Spoon   Thin Liquid Thin Liquid: Within functional limits Presentation: Cup;Spoon;Straw;Self Fed    Nectar Thick Nectar Thick Liquid: Not tested   Honey Thick Honey Thick Liquid: Not tested   Puree Puree: Within functional limits Presentation: Spoon   Solid     Solid: Impaired Presentation: Self Fed Oral Phase Impairments: Impaired mastication;Reduced lingual movement/coordination Oral Phase Functional Implications: Prolonged oral transit;Impaired mastication      Delon DELENA Sage 12/17/2023,1:28 PM

## 2023-12-17 NOTE — ED Notes (Signed)
 Attempted to check pt for urine /bowel inconvenience. Pt begins yelling and screaming not to touch her.

## 2023-12-17 NOTE — ED Notes (Signed)
 Patient transported to MRI

## 2023-12-18 ENCOUNTER — Other Ambulatory Visit (HOSPITAL_COMMUNITY): Payer: Self-pay

## 2023-12-18 ENCOUNTER — Telehealth (HOSPITAL_COMMUNITY): Payer: Self-pay | Admitting: Pharmacy Technician

## 2023-12-18 DIAGNOSIS — I2699 Other pulmonary embolism without acute cor pulmonale: Secondary | ICD-10-CM | POA: Diagnosis not present

## 2023-12-18 DIAGNOSIS — I441 Atrioventricular block, second degree: Secondary | ICD-10-CM | POA: Diagnosis not present

## 2023-12-18 DIAGNOSIS — R001 Bradycardia, unspecified: Secondary | ICD-10-CM

## 2023-12-18 DIAGNOSIS — A419 Sepsis, unspecified organism: Secondary | ICD-10-CM

## 2023-12-18 DIAGNOSIS — I442 Atrioventricular block, complete: Secondary | ICD-10-CM

## 2023-12-18 LAB — CBC
HCT: 29.4 % — ABNORMAL LOW (ref 36.0–46.0)
Hemoglobin: 9.8 g/dL — ABNORMAL LOW (ref 12.0–15.0)
MCH: 29.4 pg (ref 26.0–34.0)
MCHC: 33.3 g/dL (ref 30.0–36.0)
MCV: 88.3 fL (ref 80.0–100.0)
Platelets: 173 K/uL (ref 150–400)
RBC: 3.33 MIL/uL — ABNORMAL LOW (ref 3.87–5.11)
RDW: 13.5 % (ref 11.5–15.5)
WBC: 8.5 K/uL (ref 4.0–10.5)
nRBC: 0 % (ref 0.0–0.2)

## 2023-12-18 LAB — BASIC METABOLIC PANEL WITH GFR
Anion gap: 11 (ref 5–15)
BUN: 15 mg/dL (ref 8–23)
CO2: 22 mmol/L (ref 22–32)
Calcium: 8.7 mg/dL — ABNORMAL LOW (ref 8.9–10.3)
Chloride: 101 mmol/L (ref 98–111)
Creatinine, Ser: 0.88 mg/dL (ref 0.44–1.00)
GFR, Estimated: >60 mL/min (ref >60)
Glucose, Bld: 87 mg/dL (ref 70–99)
Potassium: 3.4 mmol/L — ABNORMAL LOW (ref 3.5–5.1)
Sodium: 134 mmol/L — ABNORMAL LOW (ref 135–145)

## 2023-12-18 LAB — HEPARIN LEVEL (UNFRACTIONATED)
Heparin Unfractionated: <0.1 [IU]/mL — ABNORMAL LOW (ref 0.30–0.70)
Heparin Unfractionated: 0.21 [IU]/mL — ABNORMAL LOW (ref 0.30–0.70)

## 2023-12-18 LAB — MAGNESIUM: Magnesium: 2.2 mg/dL (ref 1.7–2.4)

## 2023-12-18 LAB — LEGIONELLA PNEUMOPHILA SEROGP 1 UR AG: L. pneumophila Serogp 1 Ur Ag: NEGATIVE

## 2023-12-18 MED ORDER — POTASSIUM CHLORIDE CRYS ER 20 MEQ PO TBCR
40.0000 meq | EXTENDED_RELEASE_TABLET | Freq: Once | ORAL | Status: AC
Start: 2023-12-18 — End: 2023-12-18
  Administered 2023-12-18: 40 meq via ORAL
  Filled 2023-12-18: qty 2

## 2023-12-18 MED ORDER — ARIPIPRAZOLE 5 MG PO TABS
5.0000 mg | ORAL_TABLET | Freq: Every day | ORAL | Status: DC
  Administered 2023-12-18 – 2023-12-19 (×2): 5 mg via ORAL
  Filled 2023-12-18 (×2): qty 1

## 2023-12-18 MED ORDER — APIXABAN 5 MG PO TABS
10.0000 mg | ORAL_TABLET | Freq: Two times a day (BID) | ORAL | Status: DC
  Administered 2023-12-18 – 2023-12-19 (×3): 10 mg via ORAL
  Filled 2023-12-18 (×3): qty 2

## 2023-12-18 MED ORDER — FESOTERODINE FUMARATE ER 4 MG PO TB24
4.0000 mg | ORAL_TABLET | Freq: Every day | ORAL | Status: DC
Start: 2023-12-18 — End: 2023-12-19
  Administered 2023-12-18 – 2023-12-19 (×2): 4 mg via ORAL
  Filled 2023-12-18 (×2): qty 1

## 2023-12-18 MED ORDER — HEPARIN BOLUS VIA INFUSION
2000.0000 [IU] | Freq: Once | INTRAVENOUS | Status: AC
  Administered 2023-12-18: 2000 [IU] via INTRAVENOUS
  Filled 2023-12-18: qty 2000

## 2023-12-18 MED ORDER — BUSPIRONE HCL 10 MG PO TABS
15.0000 mg | ORAL_TABLET | Freq: Three times a day (TID) | ORAL | Status: DC
Start: 2023-12-18 — End: 2023-12-19
  Administered 2023-12-18 – 2023-12-19 (×5): 15 mg via ORAL
  Filled 2023-12-18 (×5): qty 2

## 2023-12-18 MED ORDER — HEPARIN BOLUS VIA INFUSION
1000.0000 [IU] | Freq: Once | INTRAVENOUS | Status: AC
  Administered 2023-12-18: 1000 [IU] via INTRAVENOUS
  Filled 2023-12-18: qty 1000

## 2023-12-18 MED ORDER — APIXABAN 5 MG PO TABS: 5.0000 mg | ORAL_TABLET | Freq: Two times a day (BID) | ORAL | Status: DC

## 2023-12-18 NOTE — Care Management Important Message (Signed)
 Important Message  Patient Details  Name: Stacy Murillo MRN: [id number] Date of Birth: 1955-12-25   Important Message Given:  Yes - Medicare IM     [name] [name] 12/18/2023, 12:32 PM

## 2023-12-18 NOTE — Evaluation (Signed)
 Occupational Therapy Evaluation Patient Details Name: Stacy Murillo MRN: [id number] DOB: [birth year] Today's Date: 12/18/2023   History of Present Illness   Pt is a 68 year old female admitted with acute hypoxic respiratory failure, acute bilateral PEs,She also has interim history of recent dx of UTI for which she was treated with macrobid at the  SNF.      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     Clinical Impressions Chart reviewed to date, pt greeted in bed, oriented to self, place after cueing, not oriented to date/situation. Pt presents with deficits in strength, endurance, activity tolerance, balance, cognition, affecting safe and optimal ADL completion. MAX A +2 required for bed mobility with CGA-MIN A for static sitting. MIN A required for oral care, increased multi modal cues with increased external stim (people talking in the room). MAX A +2 required for lateral scoots up the bed with frequent multi modal cues. Pt will benefit from acute OT to address functional deficits and to facilitate optimal ADL performance. PT is left in modified chair position, all needs met. OT will follow.      If plan is discharge home, recommend the following:   A lot of help with walking and/or transfers;A lot of help with bathing/dressing/bathroom;Supervision due to cognitive status     Functional Status Assessment   Patient has had a recent decline in their functional status and demonstrates the ability to make significant improvements in function in a reasonable and predictable amount of time.     Equipment Recommendations   Other (comment) (defer)     Recommendations for Other Services         Precautions/Restrictions   Precautions Precautions: Fall Recall of Precautions/Restrictions:  Impaired Restrictions Weight Bearing Restrictions Per Provider Order: No Other Position/Activity Restrictions: per care everywhere, ortho note updated L LE to WBAT in July 2025     Mobility Bed Mobility Overal bed mobility: Needs Assistance Bed Mobility: Supine to Sit, Sit to Supine     Supine to sit: Max assist, +2 for physical assistance Sit to supine: Total assist, +2 for physical assistance   General bed mobility comments: step by step multi modal cues for initiation/technique    Transfers Overall transfer level: Needs assistance                 General transfer comment: lateral scoot up the bed 2 attempts with MAX A +2, frequent multi modal cues      Balance Overall balance assessment: Needs assistance Sitting-balance support: Feet supported Sitting balance-Leahy Scale: Fair Sitting balance - Comments: MIN A, progress to CGA for approx 10 min Postural control: Posterior lean                                 ADL either performed or assessed with clinical judgement   ADL Overall ADL's : Needs assistance/impaired     Grooming: Minimal assistance;Sitting;Oral care Grooming Details (indicate cue type and reason): on edge of bed, increased multi modal cues for attention to task with external stimuli             Lower Body Dressing: Maximal assistance Lower Body Dressing Details (indicate cue type and reason): socks     Toileting- Clothing Manipulation and Hygiene: Maximal assistance  Vision Patient Visual Report: No change from baseline       Perception         Praxis         Pertinent Vitals/Pain Pain Assessment Pain Assessment: No/denies pain     Extremity/Trunk Assessment Upper Extremity Assessment Upper Extremity Assessment: Generalized weakness   Lower Extremity Assessment Lower Extremity Assessment: Generalized weakness;Defer to PT evaluation       Communication Communication Communication: No  apparent difficulties   Cognition Arousal: Alert Behavior During Therapy: Flat affect Cognition: History of cognitive impairments, Cognition impaired   Orientation impairments: Situation   Memory impairment (select all impairments): Declarative long-term memory, Non-declarative long-term memory Attention impairment (select first level of impairment): Sustained attention Executive functioning impairment (select all impairments): Initiation, Sequencing, Reasoning, Problem solving                   Following commands: Impaired Following commands impaired: Follows one step commands with increased time     Cueing  General Comments   Cueing Techniques: Verbal cues;Gestural cues;Tactile cues;Visual cues  spo2 >90% on RA throughout, HR between 100-104 bpm with mobility   Exercises Other Exercises Other Exercises: edu pt/husband/daugther re: role of OT, role of rehab, discharge recommendations   Shoulder Instructions      Home Living Family/patient expects to be discharged to:: Skilled nursing facility                                 Additional Comments: has been receiving SNF, per family, will return at discharge      Prior Functioning/Environment Prior Level of Function : Needs assist;History of Falls (last six months)             Mobility Comments: has been working on standing in parallel bars and weightshifting. uses lift for transfers ADLs Comments: assist for ADL/IADL from staff but does participate    OT Problem List: Decreased strength;Impaired balance (sitting and/or standing);Decreased cognition;Decreased safety awareness;Decreased activity tolerance;Decreased knowledge of use of DME or AE   OT Treatment/Interventions: Self-care/ADL training;DME and/or AE instruction;Therapeutic activities;Balance training;Therapeutic exercise;Energy conservation;Patient/family education      OT Goals(Current goals can be found in the care plan section)    Acute Rehab OT Goals Patient Stated Goal: rehab OT Goal Formulation: With patient/family Time For Goal Achievement: 01/01/24 Potential to Achieve Goals: Good ADL Goals Pt Will Perform Grooming: sitting;with supervision Pt Will Perform Lower Body Dressing: with mod assist;sitting/lateral leans;sit to/from stand Pt Will Transfer to Toilet: with mod assist;stand pivot transfer Pt Will Perform Toileting - Clothing Manipulation and hygiene: with mod assist;sitting/lateral leans;sit to/from stand   OT Frequency:  Min 2X/week    Co-evaluation PT/OT/SLP Co-Evaluation/Treatment: Yes Reason for Co-Treatment: Complexity of the patient's impairments (multi-system involvement);Necessary to address cognition/behavior during functional activity;For patient/therapist safety;To address functional/ADL transfers PT goals addressed during session: Mobility/safety with mobility;Balance;Strengthening/ROM OT goals addressed during session: ADL's and self-care      AM-PAC OT 6 Clicks Daily Activity     Outcome Measure Help from another person eating meals?: A Little Help from another person taking care of personal grooming?: A Little Help from another person toileting, which includes using toliet, bedpan, or urinal?: A Lot Help from another person bathing (including washing, rinsing, drying)?: A Lot Help from another person to put on and taking off regular upper body clothing?: A Little Help from another person to put on and taking off regular lower body  clothing?: A Lot 6 Click Score: 15   End of Session Nurse Communication: Mobility status  Activity Tolerance: Patient tolerated treatment well Patient left: in bed;with call bell/phone within reach;with bed alarm set;with family/visitor present (modified chair position)  OT Visit Diagnosis: Other abnormalities of gait and mobility (R26.89);Muscle weakness (generalized) (M62.81)                Time: 8963-8895 OT Time Calculation (min): 28  min Charges:  OT General Charges $OT Visit: 1 Visit OT Evaluation $OT Eval Moderate Complexity: 1 Mod  Therisa Sheffield, OTD OTR/L  12/18/23, 12:51 PM

## 2023-12-18 NOTE — Progress Notes (Signed)
 Speech Language Pathology Treatment: Dysphagia  Patient Details Name: Stacy Murillo MRN: [id number] DOB: 1956-01-17 Today's Date: 12/18/2023 Time: 8689-8644 SLP Time Calculation (min) (ACUTE ONLY): 45 min  Assessment / Plan / Recommendation Clinical Impression  Pt seen for ongoing assessment of swallowing; toleration of mech soft diet consistency. She is awake/alert, verbally responsive and engaged in po intake w/ this SLP; MOD-SEVERE Confusion noted. She perseverated on asking about how many children to you have?, then talking about her children but then could not state how many children she had or if she had any children. She was fidgity and agitated by the [place] and wanted them off. Unsure of pt's Baseline Cognitive functioning in the home in setting of Baseline medical issues. On RA; wbc wnl. Afebrile.   Pt explained general aspiration precautions and agreed verbally to the need for following them especially sitting upright for all oral intake -- supported behind the back for full upright sitting. Pt assisted w/ feeding and consumed ~>50% of the chopped spaghetti- easier for mastication/oral phase. She consumed other soft solids, puree, and thin liquids via straw(1-2 sips at one time). No overt, clinical s/s of aspiration were noted w/ any consistency; respiratory status remained calm and unlabored, vocal quality clear b/t trials, no coughing. Oral phase appeared [facility] for bolus management and timely A-P transfer for swallowing w/ appropriate oral clearing w/ the liquids and purees; min, diffuse scattered spaghetti residue noted. Pt used lingual sweeping and a f/u swallow to clear fully- verbal/visual cues by this SLP. No deficits swallowing noted per chart notes.   Pt appears at reduced risk for aspriation when following general aspiration precautions using a slightly modified diet of mech soft foods(easy to chew, cut/moistened) AND w/ 100% Supervision and feeding assistance.  Recommend  continue a mech soft diet for ease of soft foods w/ gravies added to moisten foods; Thin liquids- monitor sips. Recommend general aspiration precautions including single sips, slowly and small bite sizes when eating foods. Pills Whole in Puree if needed for safer swallowing d/t Cognitive decline. Tray setup and positioning assistance for meals; feeding support and Supervision.  ST services will sign off at this time w/ MD to reconsult if new needs while admitted. If any decline/new concern re: pt's Cognitive functioning post pt's return home(to her known setting), recommend f/u w/ Neurology/PCP. NSG updated. Precautions posted at bedside, chart.        HPI HPI: Per admitting H&P Stacy Murillo is a 68 y.o. 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  Patient now returns to ED with complaint of change in [place] BIB EMS from [facility]. Per staff patient has not been eating or drinking for the last few days. She also has interim history of recent dx of UTI for which she was treated with macrobid at the  SNF.  In the field patient temp 99.7   hr 117 , bp 144/78  sat 98 on ra. Per husband last 2-3 days patient has been more lethargic ,he states due to persistence of lethargy he requested she be evaluated in the ED.  Patient is more alert now can answer some questions but does fall off to sleep quickly.History is take from husband as well as chart..  Chest Imaging: Small right pleural effusion. Consolidative and ground glass opacities in the  right lower lobe suspicious for  infarcts. Could appear similarly in the left  lower lobe atelectasis.      SLP Plan  All goals met          Recommendations  Diet recommendations: Dysphagia 3 (mechanical soft);Thin liquid Liquids provided via: Cup;Straw  (monitor) Medication Administration: Whole meds with puree (as needed for safer swallowing d/t Cognitive decline) Supervision: Staff to assist with self feeding;Full supervision/cueing for compensatory strategies Compensations: Minimize environmental distractions;Slow rate;Small sips/bites;Lingual sweep for clearance of pocketing;Multiple dry swallows after each bite/sip;Follow solids with liquid Postural Changes and/or Swallow Maneuvers: Out of bed for meals;Seated upright 90 degrees;Upright 30-60 min after meal                 (Dietician; Palliative Care) Oral care BID;Staff/trained caregiver to provide oral care   Frequent or constant Supervision/Assistance Dysphagia, oral phase (R13.11) (in setting of Cognitive decline)     All goals met       Comer Portugal, MS, CCC-SLP Speech Language Pathologist Rehab Services; [facility] [phone] (ascom) Schneider [place]  12/18/2023, 2:06 PM

## 2023-12-18 NOTE — Plan of Care (Signed)
  Problem: Clinical Measurements: Goal: Ability to maintain clinical measurements within normal limits will improve Outcome: Progressing   Problem: Clinical Measurements: Goal: Respiratory complications will improve Outcome: Progressing   Problem: Pain Managment: Goal: General experience of comfort will improve and/or be controlled Outcome: Progressing   Problem: Safety: Goal: Ability to remain free from injury will improve Outcome: Progressing

## 2023-12-18 NOTE — TOC Initial Note (Signed)
 Transition of Care [facility]) - Initial/Assessment Note    Patient Details  Name: Stacy Murillo MRN: [id number] Date of Birth: 08/11/1955  Transition of Care [facility]) CM/SW Contact:    Lauraine JAYSON Carpen, LCSW Phone Number: 12/18/2023, 2:46 PM  Clinical Narrative:   Patient only oriented to self. No family at bedside. CSW called patient's husband, introduced role, and explained that discharge planning would be discussed. Husband confirmed patient is from [facility] and has been receiving rehab there. Admissions coordinator confirmed and said she will eventually transition to LTC. Per attending physician's note, potential discharge in 24-48 hours. Left message for SNF admissions coordinator to notify. No further concerns. CSW will continue to follow patient and her husband for support and facilitate return to SNF once medically stable.      Expected Discharge Plan: Skilled Nursing Facility Barriers to Discharge: Continued Medical Work up   Patient Goals and CMS Choice     Choice offered to / list presented to : Spouse      Expected Discharge Plan and Services     Post Acute Care Choice: Resumption of Svcs/PTA Provider Living arrangements for the past 2 months: Skilled Nursing Facility                                      Prior Living Arrangements/Services Living arrangements for the past 2 months: Skilled Nursing Facility Lives with:: Facility Resident Patient language and need for interpreter reviewed:: Yes Do you feel safe going back to the place where you live?: Yes      Need for Family Participation in Patient Care: Yes (Comment) Care giver support system in place?: Yes (comment)   Criminal Activity/Legal Involvement Pertinent to Current Situation/Hospitalization: No - Comment as needed  Activities of Daily Living   ADL Screening (condition at time of admission) Independently performs ADLs?: No Does the patient have a NEW difficulty with  bathing/dressing/toileting/self-feeding that is expected to last >3 days?: No Does the patient have a NEW difficulty with getting in/out of bed, walking, or climbing stairs that is expected to last >3 days?: No Does the patient have a NEW difficulty with communication that is expected to last >3 days?: No Is the patient deaf or have difficulty hearing?: No Does the patient have difficulty seeing, even when wearing glasses/contacts?: No Does the patient have difficulty concentrating, remembering, or making decisions?: Yes  Permission Sought/Granted Permission sought to share information with : Facility Medical sales representative, Family Supports Permission granted to share information with : Yes, Verbal Permission Granted  Share Information with NAME: Stacy Murillo  Permission granted to share info w AGENCY: [facility]  Permission granted to share info w Relationship: Husband  Permission granted to share info w Contact Information: [phone]  Emotional Assessment Appearance:: Appears stated age Attitude/Demeanor/Rapport: Unable to Assess Affect (typically observed): Unable to Assess Orientation: : Oriented to Self Alcohol / Substance Use: Not Applicable Psych Involvement: No (comment)  Admission diagnosis:  PE (pulmonary thromboembolism) (HCC) [I26.99] Altered mental status, unspecified altered mental status type [R41.82] Sepsis, due to unspecified organism, unspecified whether acute organ dysfunction present [facility]) [A41.9] Patient Active Problem List   Diagnosis Date Noted   Acute metabolic encephalopathy 12/17/2023   Acute hypoxic respiratory failure (HCC) 12/17/2023   PE (pulmonary thromboembolism) (HCC) 12/16/2023   Acute urinary retention 10/18/2023   Femur fracture (HCC) 10/15/2023   CKD (chronic kidney disease), stage  III (HCC) 10/15/2023   Seborrheic eczema of scalp 08/13/2023   Port-A-Cath in place 05/17/2022   Generalized weakness    Altered mental status  02/25/2022   Fall at home, initial encounter 02/25/2022   Gait instability 02/25/2022   Dehydration 02/25/2022   COVID-19 05/09/2021   Hypotension, chronic 03/25/2021   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 [facility]) 04/21/2020   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   Primary CNS lymphoma 03/08/2019   Goals of care, counseling/discussion 03/08/2019   Lesion of frontal lobe of brain 02/19/2019   Weight gain, abnormal 12/23/2018   Generalized anxiety disorder 02/09/2018   History of autologous stem cell transplant (HCC) 01/15/2018   Immunization due 01/15/2018   Vaginal dryness, menopausal 12/18/2017   Stem cell transplant candidate 05/30/2017   Diverticulitis large intestine w/o perforation or abscess w/bleeding 02/17/2017   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   DVT (deep venous thrombosis) (HCC) 10/27/2015   Generalized headaches 10/27/2015   Oral mucositis due to antineoplastic therapy 10/14/2015   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   Left knee pain 07/20/2014   Esophageal reflux 04/06/2014   Special screening for malignant neoplasms, colon 04/06/2014   Screening for breast cancer 07/10/2011   Osteopenia 07/06/2011   Vitamin D  deficiency 07/06/2011   Hyperlipidemia 07/06/2011   Disorder of bone and cartilage 07/06/2011   Osteoporosis    Overweight 10/19/2008   PCP:  Lenon Layman ORN, MD Pharmacy:   [facility] - [place], [place] - [address] [place] [address] Phone: [phone] Fax: [phone]     Social Drivers of Health (SDOH) Social History: SDOH Screenings   Food Insecurity: No Food Insecurity (12/16/2023)  Housing: Low Risk  (12/16/2023)  Transportation Needs: No Transportation Needs (12/16/2023)  Utilities: Not At Risk (12/16/2023)  Alcohol Screen: Low Risk  (07/08/2023)  Depression (PHQ2-9): Low Risk  (08/13/2023)  Financial Resource  Strain: Low Risk  (12/04/2023)   Received from [facility]  Physical Activity: Inactive (07/08/2023)  Social Connections: Unknown (12/16/2023)  Recent Concern: Social Connections - Moderately Isolated (10/15/2023)  Stress: No Stress Concern Present (07/08/2023)  Tobacco Use: Low Risk  (12/16/2023)  Health Literacy: Inadequate Health Literacy (07/08/2023)   SDOH Interventions:     Readmission Risk Interventions     No data to display

## 2023-12-18 NOTE — Progress Notes (Signed)
 Progress Note   Patient: Stacy Murillo [place] FMW:[id number] DOB: Mar 07, 1956 DOA: 12/16/2023     2 DOS: the patient was seen and examined on 12/18/2023   Brief hospital course:  68 y.o. 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  Patient now returns to ED with complaint of change in [place] BIB EMS from [facility]. Per staff patient has not been eating or drinking for the last few days. She also has interim history of recent dx of UTI for which she was treated with macrobid at the  SNF.  In the field patient temp 99.7   HR 117 , BP 144/78, O2 sat 98 on RA.  Per husband last 2-3 days patient has been more lethargic, he states due to persistence of lethargy he requested she be evaluated in the ED.    Evaluation in the ED revealed acute bilateral pulmonary embolisms PEs with associated acute respiratory failure with hypoxia.  See H&P for full HPI on admission & ED course.   Assessment and Plan:  Acute hypoxic respiratory failure -resolved, patient O2 sat stable on room air.   -- Monitor closely and supplement oxygen if sats below 92%   Acute bilateral pulmonary embolism CTA showed bilateral lobar and segmental PEs, no evidence of right heart strain, right lower lobe pulmonary infarct, small right pleural effusion.  Etiology multifactorial given underlying DLBCL as well as recent hip fracture. -- Has been on heparin  drip since admission --Transition to Eliquis  today --Patient will need to be on anticoagulation for at least 3 months.    Hypokalemia -mild, K3.4 --40 mEq KCl p.o. p.o. to replace --Monitor BMP   Acute metabolic encephalopathy with underlying cognitive impairment CT head no acute findings.  MRI noting stable advanced atrophy and previous ventriculostomy.  Mentation appears back  to baseline per family at bedside this morning. --Delirium precautions  HCAP ruled out -clinically no signs or symptoms of pneumonia.  Right lower lobe finding on CT scan more likely pulmonary infarct in the setting of PE -- Monitor fever curve, CBC for signs or symptoms of pneumonia   Urinary tract infection-POA  - Recently treated.   Repeat UA on admission negative for signs of infection.   Diffuse large Murillo-cell lymphoma Progressive cognitive impairment.   --Follow-up in outpatient setting.   Physical debilitation and muscle weakness Recent left femur fracture s/p repair.  Recurrent falls.   --PT/OT  Mood disorder -- Resume home Abilify  and BuSpar       Subjective: Patient seen working with PT OT, family at bedside this morning.  Patient denies complaints including chest pain or shortness of breath.  Family at bedside report patient seems to be back to her usual baseline today.  No acute reported    Physical Exam: Vitals:   12/17/23 1937 12/18/23 0351 12/18/23 0805 12/18/23 1141  BP: 126/73 132/64 (!) 110/93 138/68  Pulse: (!) 109 94 97   Resp: 18 19    Temp: 98.7 F (37.1 C) 98.4 F (36.9 C) 98 F (36.7 C) 97.8 F (36.6 C)  TempSrc:   Axillary Axillary  SpO2: 98% 98%    Weight:      Height:       General exam: awake, alert, no acute distress, obese HEENT: moist mucus membranes, hearing grossly normal  Respiratory system: CTAB, no wheezes, rales or rhonchi, normal respiratory effort. Cardiovascular  system: normal S1/S2, RRR Gastrointestinal system: soft, NT, ND Central nervous system: A&O x self and hospital based. no gross focal neurologic deficits, normal speech Extremities: moves all, no edema, normal tone Skin: dry, intact, normal temperature Psychiatry: normal mood, flat affect judgment and insight appear intact   Data Reviewed:  Notable labs: Sodium 134, K3.4, calcium  8.7, hemoglobin 9.8 stable, leukocytosis resolved  Family Communication: Multiple  family at bedside on rounds  Disposition: Status is: Inpatient Remains inpatient appropriate while transitioning off of IV heparin  today, anticipate medically ready for discharge in 24 to 48 hours pending stability of further improvement   Planned Discharge Destination: Return to prior facility    Time spent: 45 minutes  Author: Burnard DELENA Cunning, DO 12/18/2023 2:01 PM  For on call review [email or web address].

## 2023-12-18 NOTE — Progress Notes (Signed)
 PHARMACY - ANTICOAGULATION CONSULT NOTE  Pharmacy Consult for IV heparin  dosage adjustment  Indication: pulmonary embolus  Allergies  Allergen Reactions   Ondansetron  Hcl Other (See Comments)    Severe constipation    Oxycodone      Biliary colic with opioids s/p cholecystectomy     Rituximab  Other (See Comments)    Pt reports throat tightness and facial swelling and redness during infusion of Rituxan     Patient Measurements: Height: 5' 2 (157.5 cm) Weight: 93.2 kg (205 lb 7.5 oz) IBW/kg (Calculated) : 50.1 HEPARIN  DW (KG): 71.8  Vital Signs: Temp: 98 F (36.7 C) (08/06 0805) BP: 110/93 (08/06 0805) Pulse Rate: 97 (08/06 0805)  Labs: Recent Labs    12/16/23 1131 12/16/23 1745 12/16/23 1854 12/16/23 2335 12/17/23 0527 12/17/23 1012 12/17/23 1827 12/18/23 0313 12/18/23 0954  HGB 11.4*  --   --   --  10.2*  --   --  9.8*  --   HCT 33.9*  --   --   --  32.0*  --   --  29.4*  --   PLT 171  --   --   --  165  --   --  173  --   APTT  --  >160*  --   --   --   --   --   --   --   LABPROT 14.6  --   --   --   --   --   --   --   --   INR 1.1  --   --   --   --   --   --   --   --   HEPARINUNFRC  --   --   --    < >  --    < > <0.10* <0.10* 0.21*  CREATININE 0.91  --   --   --  0.89  --   --  0.88  --   TROPONINIHS  --  11 10  --   --   --   --   --   --    < > = values in this interval not displayed.    Estimated Creatinine Clearance: 64.1 mL/min (by C-G formula based on SCr of 0.88 mg/dL).   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 Notable PMH DVT, lymphoma, HLD, dementia, and hypotension. Renal function 0.89 mg/dL (CrCl 36.5 mL/min). CBC notable for hemoglobin trending down to 10.2 g/dL (88.5 on 8/4). Platelet  count normal at 165 K/uL. Heparin  level this AM was > 1.1, and is third level above 1.1. Currently receiving 900 units/hr infusion, which was previously infused at 1,100 units/hr.   Results: Date Time Results Comments 8/5 1827 HL < 0.1 heparin  was still running not sure if it was stopped at any point. 8/6 0313 HL < 0.1 subtherapeutic despite rate change. Per RN, no stoppages or line issues. 8/6 0954 HL 0.21 Subtherapeutic, rate 1050 units/hour  Goal of Therapy:  Heparin  level 0.3-0.7 units/ml Monitor platelets by anticoagulation protocol: Yes   Plan:  Heparin  level remains subtherapeutic but improved from last level. Will give heparin  1000 units IV x 1 Increase heparin  infusion to 1200 units/hr Recheck heparin  level 6 hours after rate change CBC daily while on heparin .    Harold Moncus Rodriguez-Guzman PharmD, BCPS 12/18/2023 10:48 AM

## 2023-12-18 NOTE — Progress Notes (Signed)
 Patient yells and hits staff when trying to reposition. NT and RN continue to try and reposition patient. Patient hits and kicks staff. Unable to get VS at this time patient screaming and hitting when trying to but B/P cuff on. Will attempt to get patient VS in another hour.

## 2023-12-18 NOTE — [place] FL2 (Signed)
 [place]  MEDICAID FL2 LEVEL OF CARE FORM     IDENTIFICATION  Patient Name: Stacy Murillo Birthdate: Nov 01, 1955 Sex: female Admission Date (Current Location): 12/16/2023  [facility] and [place] Number:  Chiropodist and Address:  [facility], [address] [place], [place] [address]      Provider Number: [id number]  Attending Physician Name and Address:  Fausto Burnard LABOR, DO  Relative Name and Phone Number:       Current Level of Care: Hospital Recommended Level of Care: Skilled Nursing Facility Prior Approval Number:    Date Approved/Denied:   PASRR Number: [id number]  Discharge Plan: SNF    Current Diagnoses: Patient Active Problem List   Diagnosis Date Noted   Acute metabolic encephalopathy 12/17/2023   Acute hypoxic respiratory failure (HCC) 12/17/2023   PE (pulmonary thromboembolism) (HCC) 12/16/2023   Acute urinary retention 10/18/2023   Femur fracture (HCC) 10/15/2023   CKD (chronic kidney disease), stage III (HCC) 10/15/2023   Seborrheic eczema of scalp 08/13/2023   Port-A-Cath in place 05/17/2022   Generalized weakness    Altered mental status 02/25/2022   Fall at home, initial encounter 02/25/2022   Gait instability 02/25/2022   Dehydration 02/25/2022   COVID-19 05/09/2021   Hypotension, chronic 03/25/2021   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   Primary CNS lymphoma 03/08/2019   Goals of care, counseling/discussion 03/08/2019   Lesion of frontal lobe of brain 02/19/2019   Weight gain, abnormal 12/23/2018   Generalized anxiety disorder 02/09/2018   History of autologous stem cell transplant (HCC) 01/15/2018   Immunization due 01/15/2018   Vaginal dryness, menopausal 12/18/2017   Stem cell transplant candidate 05/30/2017   Diverticulitis large intestine w/o perforation or abscess w/bleeding 02/17/2017   DLBCL (diffuse  large B cell lymphoma) (HCC) 05/25/2016   DVT (deep venous thrombosis) (HCC) 10/27/2015   Generalized headaches 10/27/2015   Oral mucositis due to antineoplastic therapy 10/14/2015   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 Continue heart healthy. Chopped meats. No eggs or mashed potatoes. Diet coke on lunch and dinner please.)  AMBULATORY STATUS COMMUNICATION OF NEEDS Skin   Extensive Assist Verbally Bruising, Other (Comment) (Erythema/redness.)                       Personal Care Assistance Level of Assistance  Bathing, Feeding, Dressing Bathing Assistance: Maximum assistance Feeding assistance: Limited assistance Dressing Assistance: Maximum assistance     Functional Limitations Info  Sight, Hearing, Speech Sight Info: Adequate Hearing Info: Adequate Speech Info: Adequate    SPECIAL CARE FACTORS FREQUENCY  PT (By licensed PT), OT (By licensed OT)     PT Frequency: 5 x week OT Frequency: 5 x week            Contractures  Contractures Info: Not present    Additional Factors Info  Code Status, Allergies Code Status Info: Full code Allergies Info: Ondansetron  Hcl, Oxycodone , Rituximab            Current Medications (12/18/2023):  This is the current hospital active medication list Current Facility-Administered Medications  Medication Dose Route Frequency Provider  Last Rate Last Admin   acetaminophen  (TYLENOL ) tablet 650 mg  650 mg Oral Q6H PRN Debby Camila LABOR, MD       Or   acetaminophen  (TYLENOL ) suppository 650 mg  650 mg Rectal Q6H PRN Debby Camila LABOR, MD       albuterol  (PROVENTIL ) (2.5 MG/3ML) 0.083% nebulizer solution 2.5 mg  2.5 mg Nebulization Q2H PRN Debby Camila LABOR, MD       apixaban  (ELIQUIS ) tablet 10 mg  10 mg Oral BID Fausto Sor A, DO       Followed by   NOREEN ON 12/25/2023] apixaban  (ELIQUIS ) tablet 5 mg  5 mg Oral BID Fausto Sor A, DO       ARIPiprazole  (ABILIFY ) tablet 5 mg  5 mg Oral Daily Fausto Sor A, DO   5 mg at 12/18/23 1037   busPIRone  (BUSPAR ) tablet 15 mg  15 mg Oral TID Fausto Sor A, DO   15 mg at 12/18/23 1036   fesoterodine  (TOVIAZ ) tablet 4 mg  4 mg Oral Daily Fausto Sor A, DO   4 mg at 12/18/23 1037   potassium chloride  SA (KLOR-CON  M) CR tablet 40 mEq  40 mEq Oral Once Fausto Sor A, DO       Facility-Administered Medications Ordered in Other Encounters  Medication Dose Route Frequency Provider Last Rate Last Admin   0.9 %  sodium chloride  infusion   Intravenous Continuous Lionell Sonny FALCON, NP   Stopped at 10/14/15 1312   methotrexate  (50 mg/ml) 6.3 g in sodium chloride  0.9 % 1,000 mL injection   Intravenous Once Brahmanday, Govinda R, MD       sodium chloride  flush (NS) 0.9 % injection 10 mL  10 mL Intravenous PRN Brahmanday, Govinda R, MD   10 mL at 10/05/16 1400     Discharge Medications: Please see discharge summary for a list of discharge medications.  Relevant Imaging Results:  Relevant Lab Results:   Additional Information SS#: [id number]  Lauraine JAYSON Carpen, LCSW

## 2023-12-18 NOTE — Telephone Encounter (Signed)
 Patient Product/process development scientist completed.    The patient is insured through [employer]. Patient has Medicare and is not eligible for a copay card, but may be able to apply for patient assistance or Medicare RX Payment Plan (Patient Must reach out to their plan, if eligible for payment plan), if available.    Ran test claim for Eliquis  5 mg and the current 30 day co-pay is $503.69 due to a $590.00 deductible.   This test claim was processed through [facility]- copay amounts may vary at other pharmacies due to pharmacy/plan contracts, or as the patient moves through the different stages of their insurance plan.     Stacy Murillo, CPHT Pharmacy Technician III Certified Patient Advocate [facility] Patient Advocate Team Direct Number: [phone]  Fax: [phone]

## 2023-12-18 NOTE — Progress Notes (Signed)
 Notified by CMS that patient ran sinus brady and complete heart block x 3. Dr Cleatus notified, 12 lead EKG completed.  Dr. Cleatus forward to Dr. Fausto. Will inform [place] AM nurse.

## 2023-12-18 NOTE — Consult Note (Signed)
 Cardiology Consultation   Patient ID: Stacy Murillo MRN: [id number]; DOB: [birth year]  Admit date: 12/16/2023 Date of Consult: 12/18/2023  PCP:  Lenon Layman ORN, MD   [facility] Providers Cardiologist:  None        Patient Profile: Stacy Murillo is a 68 y.o. 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 Fausto.  History of Present Illness: Stacy Murillo has significant cognitive impairment and lives in a skilled nursing facility.  Her husband and daughter are at the bedside and assists with history.  They report for 2 to 3 days prior to admission, patient had been more lethargic than baseline with decreased p.o. intake.  She was recently diagnosed with a UTI for which she was being treated with antibiotics.  She was brought to the ED for further evaluation.  In the ED, BP 135/98, HR 117, T101.7 F with otherwise normal vital signs.  Pertinent labs include WBC 14.7, Hgb 11.4 (near baseline).  CT chest was obtained which showed bilateral pulmonary emboli and small right pleural effusion.  She was started on heparin  and antibiotics for possible pneumonia.  In the early morning on 8/6, patient was noted to have sinus bradycardia and heart block on telemetry.  Cardiology was asked to consult for further evaluation.  At time of exam, patient is resting comfortably in bed.  Her husband and daughter are present to assist with history.  She does not have any complaints of chest pain, shortness of breath, lightheadedness, dizziness, or lower extremity swelling.   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   Leg cramps    Lymphoma (HCC) 09/14/2015   Monoclonal B cell lymphoma   Night sweats    Osteoporosis    Overweight(278.02)    Obesity   Palpitations    Shortness of breath dyspnea    Tachycardia    Thrombocytopenia (HCC)     Past Surgical History:  Procedure Laterality Date   ABDOMINAL HYSTERECTOMY  2005   BONE MARROW BIOPSY  09/14/15   CHOLECYSTECTOMY  1997   COLONOSCOPY  07/05/2014   ESOPHAGOGASTRODUODENOSCOPY  07/05/2014   HIP PINNING,CANNULATED Left 10/16/2023   Procedure: FIXATION, FEMUR, NECK, PERCUTANEOUS, USING SCREW;  Surgeon: Edie Norleen PARAS, MD;  Location: [facility] ORS;  Service: Orthopedics;  Laterality: Left;   INGUINAL LYMPH NODE BIOPSY Right 10/05/2015   Procedure: INGUINAL LYMPH NODE BIOPSY;  Surgeon: Louanne KANDICE Muse, MD;  Location: [facility] ORS;  Service: General;  Laterality: Right;   PERIPHERAL VASCULAR CATHETERIZATION N/A 09/27/2015   Procedure: Pat Cath Insertion;  Surgeon: Selinda GORMAN Gu, MD;  Location: [facility] INVASIVE CV LAB;  Service: Cardiovascular;  Laterality: N/A;   PORTA CATH REMOVAL Right 06/03/2017   Procedure: PORTA CATH REMOVAL, with venous thrombectomy;  Surgeon: Gu Selinda GORMAN, MD;  Location: [facility] INVASIVE CV LAB;  Service: Cardiovascular;  Laterality: Right;   PORTACATH PLACEMENT Right        Scheduled Meds:  apixaban   10 mg Oral BID   Followed by   NOREEN ON 12/25/2023] apixaban   5 mg Oral BID   ARIPiprazole   5 mg Oral Daily   busPIRone   15 mg Oral TID   fesoterodine   4 mg Oral Daily   potassium chloride   40 mEq Oral Once   Continuous Infusions:  PRN Meds: acetaminophen  **OR** acetaminophen , albuterol   Allergies:    Allergies  Allergen Reactions   Ondansetron  Hcl Other (See Comments)    Severe constipation    Oxycodone      Biliary colic with opioids s/p  cholecystectomy     Rituximab  Other (See Comments)    Pt reports throat tightness and facial swelling and redness during infusion of Rituxan     Social History:   Social History   Socioeconomic History   Marital status: Married    Spouse name: Not on file   Number of children: Not on file   Years of education: Not on file   Highest education level: Not on file  Occupational History   Not on file  Tobacco Use   Smoking status: Never   Smokeless tobacco: Never  Vaping Use   Vaping status: Never Used  Substance and Sexual Activity   Alcohol use: No    Alcohol/week: 0.0 standard drinks of alcohol   Drug use: No   Sexual activity: Yes    Birth control/protection: None  Other Topics Concern   Not on file  Social History Narrative   Married   Does not get regular exercise   Social Drivers of Health   Financial Resource Strain: Low Risk  (12/04/2023)   Received from [facility]   Overall Financial Resource Strain (CARDIA)    Difficulty of Paying Living Expenses: Not hard at all  Food Insecurity: No Food Insecurity (12/16/2023)   Hunger Vital Sign    Worried About Running Out of Food in the Last Year: Never true    Ran Out of Food in the Last Year: Never true  Transportation Needs: No Transportation Needs (12/16/2023)   PRAPARE - Administrator, Civil Service (Medical): No    Lack of Transportation (Non-Medical): No  Physical Activity: Inactive (07/08/2023)   Exercise Vital Sign    Days of Exercise per Week: 0 days    Minutes of Exercise per Session: 0 min  Stress: No Stress Concern Present (07/08/2023)   [employer] of Occupational Health - Occupational Stress Questionnaire    Feeling of Stress : Only a little  Social Connections: Unknown (12/16/2023)   Social Connection and Isolation Panel    Frequency of Communication with Friends and Family: Patient unable to answer    Frequency of Social Gatherings with Friends and Family: Patient unable  to answer    Attends Religious Services: Patient unable to answer    Active Member of Clubs or Organizations: Patient unable to answer    Attends Banker Meetings: Patient unable to answer    Marital Status: Married  Recent Concern: Social Connections - Moderately Isolated (10/15/2023)   Social Connection and Isolation Panel    Frequency of Communication with Friends and Family: More than three times a week    Frequency of Social Gatherings with Friends and Family: More than three times a week    Attends Religious Services: Never    Database administrator or Organizations: No    Attends Banker Meetings: Never    Marital Status: Married  Catering manager Violence: Patient Unable To  Answer (12/16/2023)   Humiliation, Afraid, Rape, and Kick questionnaire    Fear of Current or Ex-Partner: Patient unable to answer    Emotionally Abused: Patient unable to answer    Physically Abused: Patient unable to answer    Sexually Abused: Patient unable to answer    Family History:    Family History  Problem Relation Age of Onset   Heart disease Father        CABG x 4   Multiple myeloma Father    Hypertension Mother    Pancreatic cancer Mother    Ulcers Mother    Asthma Mother    Brain cancer Maternal Uncle    Multiple myeloma Maternal Uncle    Diabetes Neg Hx    Breast cancer Neg Hx      ROS:  Please see the history of present illness.   Physical Exam/Data: Vitals:   12/17/23 1937 12/18/23 0351 12/18/23 0805 12/18/23 1141  BP: 126/73 132/64 (!) 110/93 138/68  Pulse: (!) 109 94 97   Resp: 18 19    Temp: 98.7 F (37.1 C) 98.4 F (36.9 C) 98 F (36.7 C) 97.8 F (36.6 C)  TempSrc:   Axillary Axillary  SpO2: 98% 98%    Weight:      Height:        Intake/Output Summary (Last 24 hours) at 12/18/2023 1602 Last data filed at 12/18/2023 0900 Gross per 24 hour  Intake 538.95 ml  Output --  Net 538.95 ml      12/16/2023   11:28 AM 10/16/2023   12:28 AM 08/13/2023     2:12 PM  Last 3 Weights  Weight (lbs) 205 lb 7.5 oz 205 lb 7.5 oz 205 lb 9.6 oz  Weight (kg) 93.2 kg 93.2 kg 93.26 kg     Body mass index is 37.58 kg/m.  General:  Well nourished, well developed, in no acute distress HEENT: normal Neck: no JVD Vascular: No carotid bruits; Distal pulses 2+ bilaterally Cardiac:  normal S1, S2; RRR; no murmur  Lungs:  clear to auscultation bilaterally, no wheezing, rhonchi or rales  Abd: soft, nontender, no hepatomegaly  Ext: no edema Skin: warm and dry   EKG:  The EKG was personally reviewed and demonstrates:  sinus tachycardia without acute ischemic changes Telemetry:  Telemetry was personally reviewed and demonstrates:  sinus rhythm with 4-5 beats of second degree heart block, type 2  Relevant CV Studies:  12/17/2023 Echo limited 1. TDS.   2. Left ventricular ejection fraction, by estimation, is 55 to 60%. The  left ventricle has normal function. The left ventricle has no regional  wall motion abnormalities.   3. Right ventricular systolic function is normal. The right ventricular  size is normal. Moderately increased right ventricular wall thickness.   4. The mitral valve was not well visualized.   5. The aortic valve was not well visualized.   Laboratory Data: High Sensitivity Troponin:   Recent Labs  Lab 12/16/23 1745 12/16/23 1854  TROPONINIHS 11 10     Chemistry Recent Labs  Lab 12/16/23 1131 12/17/23 0527 12/18/23 0313  NA 134* 136 134*  K 3.5 3.6 3.4*  CL 97* 104 101  CO2 26 23 22   GLUCOSE 130* 100* 87  BUN 12 11 15   CREATININE 0.91 0.89 0.88  CALCIUM  9.0 8.7* 8.7*  MG  --   --  2.2  GFRNONAA >60 >60 >60  ANIONGAP 11 9 11     Recent Labs  Lab 12/16/23 1131 12/17/23  0527  PROT 6.2* 6.1*  ALBUMIN 3.0* 3.0*  AST 19 15  ALT 13 12  ALKPHOS 104 98  BILITOT 1.1 0.6   Lipids No results for input(s): CHOL, TRIG, HDL, LABVLDL, LDLCALC, CHOLHDL in the last 168 hours.  Hematology Recent Labs  Lab  12/16/23 1131 12/17/23 0527 12/18/23 0313  WBC 14.7* 10.9* 8.5  RBC 3.81* 3.50* 3.33*  HGB 11.4* 10.2* 9.8*  HCT 33.9* 32.0* 29.4*  MCV 89.0 91.4 88.3  MCH 29.9 29.1 29.4  MCHC 33.6 31.9 33.3  RDW 13.5 13.5 13.5  PLT 171 165 173   Thyroid  No results for input(s): TSH, FREET4 in the last 168 hours.  BNP Recent Labs  Lab 12/16/23 1745  BNP 14.1    DDimer No results for input(s): DDIMER in the last 168 hours.  Radiology/Studies:  MR BRAIN W WO CONTRAST Result Date: 12/17/2023 IMPRESSION: 1. No acute intracranial abnormality. And restricted diffusion or enhancement to suggest recurrent lymphoma. 2. Stable advanced atrophy and diffuse confluent T2/FLAIR hyperintensities bilaterally, similar to the prior exam. This likely reflects the sequela of prior therapy. 3. Stable remote hemorrhage in the anterior left frontal lobe along a previous ventriculostomy tract. Electronically signed by: Lonni Necessary MD 12/17/2023 06:18 AM EDT RP Workstation: [id number]   CT Angio Chest Pulmonary Embolism (PE) W or WO Contrast Result Date: 12/17/2023 IMPRESSION: 1. Redemonstrated bilateral lobar and segmental pulmonary emboli similar to CT earlier today. 2. No evidence of right heart strain (RV/LV ratio less than 1). 3. Consolidative and ground glass opacities in the right lower lobe suspicious for infarcts. Pneumonia could appear similarly. 4. Small right pleural effusion. Electronically signed by: Norman Gatlin MD 12/17/2023 12:34 AM EDT RP Workstation: [id number]   CT CHEST ABDOMEN PELVIS W CONTRAST Result Date: 12/16/2023 IMPRESSION: 1. Despite not being performed as a dedicated CTA, there is strong suspicion of bilateral pulmonary emboli involving the lobar and segmental branches. This could be confirmed with dedicated CTA of the chest. No signs of right heart strain. 2. Small right pleural effusion with dependent right lower lobe airspace disease which could reflect pneumonia or a pulmonary  infarct. 3. Sigmoid diverticulosis with mild adjacent soft tissue stranding, improved from previous study, possibly reflecting mild recurrent diverticulitis. 4. No other acute findings in the abdomen or pelvis. 5.  Aortic Atherosclerosis (ICD10-I70.0). 6. Critical Value/emergent results were called by telephone at the time of interpretation on 12/16/2023 at 4:47 pm to Dr Nicholaus in the [facility] ED, who verbally acknowledged these results. Electronically Signed   By: Elsie Perone M.D.   On: 12/16/2023 16:48   CT Head Wo Contrast Result Date: 12/16/2023 IMPRESSION: Atrophy, chronic microvascular disease. No acute intracranial abnormality. Electronically Signed   By: Franky Crease M.D.   On: 12/16/2023 12:57   DG Chest Port 1 View Result Date: 12/16/2023 IMPRESSION: Poor inspiration with minimal right basilar atelectasis. Electronically Signed   By: Elspeth Bathe M.D.   On: 12/16/2023 11:50   Assessment and Plan:  Second degree heart block - Asymptomatic, noted on telemetry in the early morning on 8/6, lasting for 4-5 beats - She is hemodynamically stable - No indication for pacing - Continue to monitor on telemetry - May benefit from outpatient sleep study  Bilateral PE - Presented with AMS, found to have bilateral PE on CT chest - Recent hip fracture and long term immobility - Echo without evidence of RV strain - Transitioned from IV heparin  to DOAC today - Ongoing management per IM  For  questions or updates, please contact [facility] Please consult [email or web address] for contact info under    Signed, Lesley LITTIE Maffucci, PA-C  12/18/2023 4:02 PM

## 2023-12-18 NOTE — Evaluation (Signed)
 Physical Therapy Evaluation Patient Details Name: Stacy Murillo MRN: [id number] DOB: 1955/07/31 Today's Date: 12/18/2023  History of Present Illness  Stacy Murillo is a 68 y.o. 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  Patient now returns to ED with complaint of change in [place] BIB EMS from [facility]. Per staff patient has not been eating or drinking for the last few days. She also has interim history of recent dx of UTI for which she was treated with macrobid at the  SNF.  In the field patient temp 99.7   hr 117 , bp 144/78  sat 98 on ra. Per husband last 2-3 days patient has been more lethargic ,he states due to persistence of lethargy he requested she be evaluated in the ED.  Clinical Impression  PT/OT co-evaluation performed this date. Pt is a pleasant 68 year old female who was admitted for acute PE. Pt performs bed mobility with max assist +2. Able to sit at EOB for extended time for ADLs. Pt pleasant and agreeable to session. Pt demonstrates deficits with strength/mobility/endurance. Is currently not at baseline level. Safety mit donned at end of session. Vitals WNL with exertion. Family at bedside who give most of history. Would benefit from skilled PT to address above deficits and promote optimal return to PLOF. Pt will continue to receive skilled PT services while admitted and will defer to TOC/care team for updates regarding disposition planning.       If plan is discharge home, recommend the following: Two people to help with walking and/or transfers;A lot of help with bathing/dressing/bathroom;Supervision due to cognitive status   Can travel by private vehicle   No    Equipment Recommendations None recommended by PT  Recommendations for Other Services       Functional  Status Assessment Patient has had a recent decline in their functional status and demonstrates the ability to make significant improvements in function in a reasonable and predictable amount of time.     Precautions / Restrictions Precautions Precautions: Fall Recall of Precautions/Restrictions: Impaired Restrictions Weight Bearing Restrictions Per Provider Order: No Other Position/Activity Restrictions: per care everywhere, Dr Edie note updated L LE to WBAT in July 2025      Mobility  Bed Mobility Overal bed mobility: Needs Assistance Bed Mobility: Supine to Sit, Sit to Supine     Supine to sit: Max assist, +2 for physical assistance Sit to supine: Total assist, +2 for physical assistance   General bed mobility comments: needs cues for initiation of all mobility. Heavy assist for trunkal elevation. once seated, post lean with inconsistent ability for self correction with cues. Able to sit at EOB for extended time for ADLs    Transfers Overall transfer level: Needs assistance Equipment used: None               General transfer comment: lateral transfer up towards [facility] for improved positioning. +2 max assist for head/hips relationship. max assist given for repositioning in bed    Ambulation/Gait               General Gait Details: unable at this time  Stairs            Wheelchair Mobility     Tilt Bed    Modified Rankin (Stroke Patients Only)       Balance Overall balance  assessment: Needs assistance Sitting-balance support: Feet supported Sitting balance-Leahy Scale: Fair Sitting balance - Comments: post lean                                     Pertinent Vitals/Pain Pain Assessment Pain Assessment: No/denies pain    Home Living Family/patient expects to be discharged to:: Skilled nursing facility                   Additional Comments: has been receiving SNF, per family, will return at discharge    Prior Function  Prior Level of Function : Needs assist;History of Falls (last six months)             Mobility Comments: has been working on standing in parallel bars and weightshifting. uses lift for transfers ADLs Comments: needs assist from staff for all ADLs     Extremity/Trunk Assessment   Upper Extremity Assessment Upper Extremity Assessment: Generalized weakness    Lower Extremity Assessment Lower Extremity Assessment: Generalized weakness (L LE grossly 2/5; R LE grossly 3/5)       Communication   Communication Communication: No apparent difficulties    Cognition Arousal: Alert Behavior During Therapy: Flat affect   PT - Cognitive impairments: History of cognitive impairments                       PT - Cognition Comments: alert to name/place. Confused to situation, pleasant and not agitated at this time Following commands: Impaired Following commands impaired: Follows one step commands with increased time     Cueing Cueing Techniques: Verbal cues, Gestural cues, Tactile cues     General Comments General comments (skin integrity, edema, etc.): HR monitored 100-104bpm with exertion, O2 sats at 94% on RA    Exercises Other Exercises Other Exercises: able to sit and perform 10 reps of alt LAQ with cues for technique in addition to 8 reps of AP on L LE in supine position Other Exercises: seated ADL at bedside with OT with PT for balance support and positioning   Assessment/Plan    PT Assessment Patient needs continued PT services  PT Problem List Decreased strength;Decreased activity tolerance;Decreased balance;Decreased mobility;Decreased cognition;Decreased knowledge of use of DME;Decreased safety awareness;Cardiopulmonary status limiting activity       PT Treatment Interventions DME instruction;Therapeutic activities;Therapeutic exercise;Balance training;Wheelchair mobility training    PT Goals (Current goals can be found in the Care Plan section)  Acute Rehab  PT Goals Patient Stated Goal: to get better PT Goal Formulation: With patient Time For Goal Achievement: 01/01/24 Potential to Achieve Goals: Good    Frequency Min 2X/week     Co-evaluation PT/OT/SLP Co-Evaluation/Treatment: Yes Reason for Co-Treatment: Complexity of the patient's impairments (multi-system involvement);Necessary to address cognition/behavior during functional activity;For patient/therapist safety;To address functional/ADL transfers PT goals addressed during session: Mobility/safety with mobility;Balance;Strengthening/ROM OT goals addressed during session: ADL's and self-care       AM-PAC PT 6 Clicks Mobility  Outcome Measure Help needed turning from your back to your side while in a flat bed without using bedrails?: A Lot Help needed moving from lying on your back to sitting on the side of a flat bed without using bedrails?: A Lot Help needed moving to and from a bed to a chair (including a wheelchair)?: Total Help needed standing up from a chair using your arms (e.g., wheelchair or bedside chair)?: Total Help needed to  walk in hospital room?: Total Help needed climbing 3-5 steps with a railing? : Total 6 Click Score: 8    End of Session   Activity Tolerance: Patient tolerated treatment well Patient left: in bed;with bed alarm set;with family/visitor present (with safety mitt donned) Nurse Communication: Mobility status PT Visit Diagnosis: Muscle weakness (generalized) (M62.81);History of falling (Z91.81);Difficulty in walking, not elsewhere classified (R26.2)    Time: 1030-1105 PT Time Calculation (min) (ACUTE ONLY): 35 min   Charges:   PT Evaluation $PT Eval Moderate Complexity: 1 Mod   PT General Charges $$ ACUTE PT VISIT: 1 Visit         Corean Dade, PT, DPT, GCS [phone]   Linnette Panella 12/18/2023, 11:50 AM

## 2023-12-18 NOTE — Progress Notes (Signed)
 PHARMACY - ANTICOAGULATION CONSULT NOTE  Pharmacy Consult for IV heparin  dosage adjustment  Indication: pulmonary embolus  Allergies  Allergen Reactions   Ondansetron  Hcl Other (See Comments)    Severe constipation    Oxycodone      Biliary colic with opioids s/p cholecystectomy     Rituximab  Other (See Comments)    Pt reports throat tightness and facial swelling and redness during infusion of Rituxan     Patient Measurements: Height: 5' 2 (157.5 cm) Weight: 93.2 kg (205 lb 7.5 oz) IBW/kg (Calculated) : 50.1 HEPARIN  DW (KG): 71.8  Vital Signs: Temp: 98.4 F (36.9 C) (08/06 0351) Temp Source: Oral (08/05 1725) BP: 132/64 (08/06 0351) Pulse Rate: 94 (08/06 0351)  Labs: Recent Labs    12/16/23 1131 12/16/23 1745 12/16/23 1854 12/16/23 2335 12/17/23 0527 12/17/23 1012 12/17/23 1827 12/18/23 0313  HGB 11.4*  --   --   --  10.2*  --   --  9.8*  HCT 33.9*  --   --   --  32.0*  --   --  29.4*  PLT 171  --   --   --  165  --   --  173  APTT  --  >160*  --   --   --   --   --   --   LABPROT 14.6  --   --   --   --   --   --   --   INR 1.1  --   --   --   --   --   --   --   HEPARINUNFRC  --   --   --    < >  --  >1.10* <0.10* <0.10*  CREATININE 0.91  --   --   --  0.89  --   --  0.88  TROPONINIHS  --  11 10  --   --   --   --   --    < > = values in this interval not displayed.    Estimated Creatinine Clearance: 64.1 mL/min (by C-G formula based on SCr of 0.88 mg/dL).   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 Notable PMH DVT, lymphoma, HLD, dementia, and hypotension. Renal function 0.89 mg/dL (CrCl 36.5 mL/min). CBC notable for hemoglobin trending down to 10.2 g/dL (88.5 on 8/4). Platelet count normal at 165 K/uL. Heparin  level this AM was > 1.1, and is third level above 1.1. Currently receiving 900 units/hr infusion, which was previously infused at 1,100 units/hr.   8/5 1827 HL < 0.1 - heparin  was still running not sure if it was stopped at any  point. 8/06 0313 HL < 0.1, still subtherapeutic despite rate change.  Contacted RN, no stoppages or line issues.  Goal of Therapy:  Heparin  level 0.3-0.7 units/ml Monitor platelets by anticoagulation protocol: Yes   Plan:  Heparin  level is subtherapeutic. Will give heparin  bolus of 2000 units x 1 and increase heparin  infusion to 1050 units/hr. Recheck heparin  level in 6 hours. CBC daily while on heparin .   Rankin CANDIE Dills, PharmD, MBA 12/18/2023 4:01 AM

## 2023-12-19 DIAGNOSIS — I2699 Other pulmonary embolism without acute cor pulmonale: Secondary | ICD-10-CM | POA: Diagnosis not present

## 2023-12-19 LAB — BASIC METABOLIC PANEL WITH GFR
Anion gap: 13 (ref 5–15)
BUN: 13 mg/dL (ref 8–23)
CO2: 21 mmol/L — ABNORMAL LOW (ref 22–32)
Calcium: 9.2 mg/dL (ref 8.9–10.3)
Chloride: 104 mmol/L (ref 98–111)
Creatinine, Ser: 0.79 mg/dL (ref 0.44–1.00)
GFR, Estimated: >60 mL/min (ref >60)
Glucose, Bld: 99 mg/dL (ref 70–99)
Potassium: 4.1 mmol/L (ref 3.5–5.1)
Sodium: 138 mmol/L (ref 135–145)

## 2023-12-19 LAB — CBC
HCT: 31.5 % — ABNORMAL LOW (ref 36.0–46.0)
Hemoglobin: 10.3 g/dL — ABNORMAL LOW (ref 12.0–15.0)
MCH: 29 pg (ref 26.0–34.0)
MCHC: 32.7 g/dL (ref 30.0–36.0)
MCV: 88.7 fL (ref 80.0–100.0)
Platelets: 204 K/uL (ref 150–400)
RBC: 3.55 MIL/uL — ABNORMAL LOW (ref 3.87–5.11)
RDW: 13.5 % (ref 11.5–15.5)
WBC: 7.4 K/uL (ref 4.0–10.5)
nRBC: 0 % (ref 0.0–0.2)

## 2023-12-19 MED ORDER — APIXABAN 5 MG PO TABS
ORAL_TABLET | ORAL | Status: AC
Start: 2023-12-19 — End: 2024-04-02

## 2023-12-19 MED ORDER — APIXABAN 5 MG PO TABS
ORAL_TABLET | ORAL | 0 refills | Status: DC
Start: 2023-12-19 — End: 2023-12-19

## 2023-12-19 MED ORDER — TRAMADOL HCL 50 MG PO TABS: 50.0000 mg | ORAL_TABLET | Freq: Four times a day (QID) | ORAL | 0 refills | Status: DC | PRN

## 2023-12-19 MED ORDER — ACETAMINOPHEN 500 MG PO TABS
1000.0000 mg | ORAL_TABLET | Freq: Three times a day (TID) | ORAL | Status: DC
  Administered 2023-12-19: 1000 mg via ORAL
  Filled 2023-12-19: qty 2

## 2023-12-19 NOTE — Progress Notes (Signed)
 Discharge instructions given to SNF. Pt's spouse at beside and aware of dc instructions voices understanding. No distress noted with pt. EMS here for pt. Skin intact.

## 2023-12-19 NOTE — Discharge Summary (Signed)
 Physician Discharge Summary   Patient: Stacy Murillo [place] MRN: [id number] DOB: 07-11-1955  Admit date:     12/16/2023  Discharge date: 12/19/23  Discharge Physician: Burnard DELENA Cunning   PCP: Lenon Layman ORN, MD   Recommendations at discharge:   Follow up with Primary Care in 1-2 weeks Repeat CBC, CMP at follow up Follow up on duration of anticoagulation for acute PE.  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 *  Hospital Course:  68 y.o. 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  Patient now returns to ED with complaint of change in [place] BIB EMS from [facility]. Per staff patient has not been eating or drinking for the last few days. She also has interim history of recent dx of UTI for which she was treated with macrobid at the  SNF.  In the field patient temp 99.7   HR 117 , BP 144/78, O2 sat 98 on RA.  Per husband last 2-3 days patient has been more lethargic, he states due to persistence of lethargy he requested she be evaluated in the ED.     Evaluation in the ED revealed acute bilateral pulmonary embolisms PEs with associated acute respiratory failure with hypoxia.  See H&P for full HPI on admission & ED course.   8/7 -- pt doing well this AM.  She is having some rib cage pain at times, but improves with Tylenol .  O2 sats stable on room air.  Patient is clinically  improved and medically stable for discharge back to SNF/rehab today.   Assessment and Plan:  Acute hypoxic respiratory failure -resolved, patient O2 sat stable on room air.   -- Monitor closely and supplement oxygen if sats below 92%   Acute bilateral pulmonary embolism CTA showed bilateral lobar and segmental PEs, no evidence of right heart strain, right lower lobe pulmonary infarct, small right pleural effusion.  Etiology multifactorial given underlying DLBCL as well as recent hip fracture. -- Has been on heparin  drip since admission --Transitioned to Eliquis  on 8/6 afternoon --Continue Eliquis  10 mg BID x 6 more days, then 5 mg BID thereafter --Patient will need to be on anticoagulation for at least 3 months, then prophylactic dosing thereafter   Hypokalemia -mild, K3.4 was replaced 8/6 --Monitor BMP at follow up  High-grade AV block - isolated episode on telemetry, very short episode.  Seen by cardiology who felt this to be of little clinical significance. Echo 55-60% EF --No further work up needed   Acute metabolic encephalopathy with underlying cognitive impairment CT head no acute findings.  MRI noting stable advanced atrophy and previous ventriculostomy.  Mentation appears back to baseline per family at bedside this morning. --Delirium precautions   HCAP ruled out -clinically no signs or symptoms of pneumonia.  Right lower lobe finding on CT scan more likely pulmonary infarct in the setting of PE -- Monitor fever curve, CBC for signs or symptoms of pneumonia   Urinary tract infection-POA  -  Recently treated.   Repeat UA on admission negative for signs of infection.   Diffuse large Murillo-cell lymphoma Progressive cognitive impairment.   --Follow-up in outpatient setting.   Physical debilitation and muscle weakness Recent left femur fracture s/p repair.  Recurrent falls.   --PT/OT -- SNF/rehab recommended --Fall precautions   Mood disorder -- On Abilify  and BuSpar           Consultants: Cardiology Procedures performed: None  Disposition: Skilled nursing facility Diet recommendation:  Discharge Diet Orders (From admission, onward)     Start     Ordered   12/19/23 0000  Diet - dysphagia level 3 (chopped)       12/19/23 1224            DISCHARGE MEDICATION: Allergies as of 12/19/2023       Reactions   Ondansetron  Hcl Other (See Comments)   Severe constipation    Oxycodone     Biliary colic with opioids s/p cholecystectomy     Rituximab  Other (See Comments)   Pt reports throat tightness and facial swelling and redness during infusion of Rituxan         Medication List     STOP taking these medications    enoxaparin  40 MG/0.4ML injection Commonly known as: LOVENOX    meloxicam  7.5 MG tablet Commonly known as: MOBIC    midodrine  5 MG tablet Commonly known as: PROAMATINE    oxyCODONE  5 MG immediate release tablet Commonly known as: Oxy IR/ROXICODONE        TAKE these medications    acetaminophen  650 MG CR tablet Commonly known as: TYLENOL  Take 650 mg by mouth every 8 (eight) hours as needed for pain.   apixaban  5 MG Tabs tablet Commonly known as: ELIQUIS  Take 2 tablets (10 mg total) by mouth 2 (two) times daily for 6 days, THEN 1 tablet (5 mg total) 2 (two) times daily for 24 days. Start taking on: December 19, 2023   ARIPiprazole  5 MG tablet Commonly known as: ABILIFY  Take 5 mg by mouth daily.   busPIRone  15 MG tablet Commonly known as: BUSPAR  TAKE 1 TABLET BY MOUTH 3 TIMES DAILY   fesoterodine  4 MG Tb24 tablet Commonly known as: TOVIAZ  Take 1 tablet (4 mg total) by mouth daily.   Menthol -Zinc Oxide 0.44-20.625 % Oint Apply topically 2 (two) times daily.   traMADol  50 MG tablet Commonly known as: ULTRAM  Take 1 tablet (50 mg total) by mouth every 6 (six) hours as needed.   triamcinolone  cream 0.1 % Commonly known as: KENALOG  Apply 1 Application topically daily.        Discharge Exam: Filed Weights    12/16/23 1128  Weight: 93.2 kg   General exam: awake, drowsy but interacts when spoken to, no acute distress, obese HEENT: moist mucus membranes, hearing grossly normal  Respiratory system: CTAB diminished bases, no wheezes or rhonchi, normal respiratory effort. On room air Cardiovascular system: normal S1/S2, RRR, no JVD, murmurs, rubs, gallops, no pedal edema.   Gastrointestinal system: soft, NT, ND, no HSM felt, +bowel sounds. Central nervous system: A&O x 2. no gross focal neurologic deficits, normal speech Extremities: moves all, no edema, normal tone Skin: dry, intact, normal temperature   Condition at discharge: stable  The results of significant diagnostics from this hospitalization (including imaging, microbiology, ancillary and laboratory) are listed below for reference.   Imaging Studies: ECHOCARDIOGRAM LIMITED Result Date: 12/17/2023    ECHOCARDIOGRAM LIMITED REPORT   Patient Name:   Juno Murillo BRUNEI DAR[place]AL[place] Date of Exam: 12/17/2023 Medical Rec #:  [id number]     Height:       62.0 in Accession #:    [id number]    Weight:       205.5 lb Date of Birth:  1956/01/08     BSA:          1.934 m Patient Age:    68 years      BP:           102/59 mmHg Patient Gender: F             HR:           113 bpm. Exam Location:  [facility] Procedure: Limited Echo, Cardiac Doppler and Color Doppler (Both Spectral and            Color Flow Doppler were utilized during procedure). Indications:     Pulmonary Embolus  History:         Patient has no prior history of Echocardiogram examinations.                  Arrythmias:Tachycardia; Signs/Symptoms:Hypotension.  Sonographer:     Philomena Daring Referring Phys:  [id number] SARA-MAIZ A THOMAS Diagnosing Phys: Cara JONETTA Lovelace MD IMPRESSIONS  1. TDS.  2. Left ventricular ejection fraction, by estimation, is 55 to 60%. The left ventricle has normal function. The left ventricle has no regional wall motion abnormalities.  3. Right ventricular systolic function is normal. The right  ventricular size is normal. Moderately increased right ventricular wall thickness.  4. The mitral valve was not well visualized.  5. The aortic valve was not well visualized. Conclusion(s)/Recommendation(s): Poor windows for evaluation of left ventricular function by transthoracic echocardiography. Would recommend an alternative means of evaluation. FINDINGS  Left Ventricle: Left ventricular ejection fraction, by estimation, is 55 to 60%. The left ventricle has normal function. The left ventricle has no regional wall motion abnormalities. Right Ventricle: The right ventricular size is normal. Moderately increased right ventricular wall thickness. Right ventricular systolic function is normal. Left Atrium: Left atrial size was not well visualized. Right Atrium: Right atrial size was not well visualized. Mitral Valve: The mitral valve was not well visualized. Aortic Valve: The aortic valve was not well visualized. Pulmonic Valve: The pulmonic valve was not well visualized. Aorta: The ascending aorta was not well visualized. Additional Comments: TDS.  LEFT VENTRICLE PLAX 2D LVIDd:         3.80 cm LVIDs:         2.60 cm LV PW:         1.00 cm LV IVS:        1.10 cm LVOT diam:     2.10 cm LVOT Area:     3.46 cm   AORTA Ao Root diam: 2.70 cm  SHUNTS Systemic Diam: 2.10 cm Cara JONETTA Lovelace MD Electronically signed by Cara JONETTA Lovelace MD Signature Date/Time: 12/17/2023/1:15:57 PM    Final    MR BRAIN W WO CONTRAST Result Date: 12/17/2023 EXAM: MRI BRAIN WITH AND WITHOUT CONTRAST 12/17/2023 06:09:12 AM TECHNIQUE: Multiplanar multisequence MRI of the head/brain was performed with and without the administration of intravenous contrast. COMPARISON: CT head without contrast 12/16/2023. MR head without and with contrast 07/30/2022. 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 Not on  anticoagulation. FINDINGS: BRAIN AND VENTRICLES: No acute infarct. No acute intracranial hemorrhage. No mass effect or midline shift. No hydrocephalus. The sella is  unremarkable. Normal flow voids. No mass or abnormal enhancement. Advanced atrophy and diffuse confluent T2 and FLAIR hyperintensities bilaterally are similar to the prior exam. Remote hemorrhage in the anterior left frontal lobe along a previous ventriculostomy tract is stable. ORBITS: No acute abnormality. SINUSES: No acute abnormality. BONES AND SOFT TISSUES: Normal bone marrow signal and enhancement. No acute soft tissue abnormality. IMPRESSION: 1. No acute intracranial abnormality. And restricted diffusion or enhancement to suggest recurrent lymphoma. 2. Stable advanced atrophy and diffuse confluent T2/FLAIR hyperintensities bilaterally, similar to the prior exam. This likely reflects the sequela of prior therapy. 3. Stable remote hemorrhage in the anterior left frontal lobe along a previous ventriculostomy tract. Electronically signed by: Lonni Necessary MD 12/17/2023 06:18 AM EDT RP Workstation: [id number]   CT Angio Chest Pulmonary Embolism (PE) W or WO Contrast Result Date: 12/17/2023 EXAM: CTA of the Chest with contrast for PE 12/17/2023 12:19:00 AM TECHNIQUE: CTA of the chest was performed after the administration of intravenous contrast. Multiplanar reformatted images are provided for review. MIP images are provided for review. Automated exposure control, iterative reconstruction, and/or weight based adjustment of the mA/kV was utilized to reduce the radiation dose to as low as reasonably achievable. COMPARISON: CT earlier today at 04:15 pm. CLINICAL HISTORY: Pulmonary embolism (PE) suspected, high prob. FINDINGS: PULMONARY ARTERIES: Right upper, medial, and lower lobe segmental and lobar pulmonary emboli. Segmental pulmonary emboli in the left lower lobe. These are not significantly changed since CT earlier today. MEDIASTINUM: No  evidence of right heart strain (RV/LV ratio less than 1). Left IJ cbc tip in the mid SVC. LYMPH NODES: No mediastinal, hilar or axillary lymphadenopathy. LUNGS AND PLEURA: Small right pleural effusion. Consolidative and ground glass opacities in the right lower lobe suspicious for infarcts. Could appear similarly in the left lower lobe atelectasis. No pneumothorax. UPPER ABDOMEN: Limited images of the upper abdomen are unremarkable. SOFT TISSUES AND BONES: No acute bone or soft tissue abnormality. IMPRESSION: 1. Redemonstrated bilateral lobar and segmental pulmonary emboli similar to CT earlier today. 2. No evidence of right heart strain (RV/LV ratio less than 1). 3. Consolidative and ground glass opacities in the right lower lobe suspicious for infarcts. Pneumonia could appear similarly. 4. Small right pleural effusion. Electronically signed by: Norman Gatlin MD 12/17/2023 12:34 AM EDT RP Workstation: [id number]   CT CHEST ABDOMEN PELVIS W CONTRAST Result Date: 12/16/2023 CLINICAL DATA:  Sepsis Pt arrives from [facility] via [facility] with C/O AMS pt hasn't been eating or drinking for the last two days. Pt just finished antibiotics (three days ago) for a UTI. EXAM: CT CHEST, ABDOMEN, AND PELVIS WITH CONTRAST TECHNIQUE: Multidetector CT imaging of the chest, abdomen and pelvis was performed following the standard protocol during bolus administration of intravenous contrast. RADIATION DOSE REDUCTION: This exam was performed according to the departmental dose-optimization program which includes automated exposure control, adjustment of the mA and/or kV according to patient size and/or use of iterative reconstruction technique. CONTRAST:  OMNIPAQUE  IOHEXOL  300 MG/ML  SOLN COMPARISON:  Chest radiographs 12/16/2023 and 10/15/2023. Abdominopelvic CT 02/15/2017. FINDINGS: CT CHEST FINDINGS Cardiovascular: Left IJ Port-A-Cath extends to the superior SVC level. There is minimal atherosclerosis of the aorta and great  vessels. No acute systemic arterial abnormalities are identified. However, despite not being performed as a dedicated CTA, there is strong suspicion of bilateral pulmonary emboli involving the lobar and segmental branches. The heart size is normal. There is no pericardial effusion. Mediastinum/Nodes: There are no enlarged mediastinal, hilar or axillary lymph nodes.  The thyroid  gland, trachea and esophagus demonstrate no significant findings. Lungs/Pleura: Small right pleural effusion with dependent right lower lobe airspace disease which could reflect pneumonia or a pulmonary infarct. There is mild dependent atelectasis in the left lower lobe. Musculoskeletal/Chest wall: No chest wall mass or suspicious osseous findings. CT ABDOMEN AND PELVIS FINDINGS Hepatobiliary: The liver is normal in density without suspicious focal abnormality. Stable mild extrahepatic biliary dilatation post cholecystectomy. Pancreas: Unremarkable. No pancreatic ductal dilatation or surrounding inflammatory changes. Spleen: Normal in size without focal abnormality. Adrenals/Urinary Tract: Both adrenal glands appear normal. No evidence of urinary tract calculus, suspicious renal lesion or hydronephrosis. Mild left renal cortical scarring. The bladder appears unremarkable for its degree of distention. Stomach/Bowel: No enteric contrast administered. The stomach appears unremarkable for its degree of distension. No small bowel distension, wall thickening or surrounding inflammation. There are diverticular changes within the sigmoid colon with mild adjacent soft tissue stranding, improved from previous study. The appendix appears normal. Vascular/Lymphatic: There are no enlarged abdominal or pelvic lymph nodes. Mild aortic and branch vessel atherosclerosis without evidence of aneurysm or large vessel occlusion. No pelvic vein DVT identified. Reproductive: Status post hysterectomy.  No adnexal mass. Other: Mild diastasis of the rectus abdominus  muscles at the level of the umbilicus. No significant hernia. No ascites or pneumoperitoneum. Musculoskeletal: No acute or significant osseous findings. Interval left hip pinning. Mild degenerative changes in the spine. IMPRESSION: 1. Despite not being performed as a dedicated CTA, there is strong suspicion of bilateral pulmonary emboli involving the lobar and segmental branches. This could be confirmed with dedicated CTA of the chest. No signs of right heart strain. 2. Small right pleural effusion with dependent right lower lobe airspace disease which could reflect pneumonia or a pulmonary infarct. 3. Sigmoid diverticulosis with mild adjacent soft tissue stranding, improved from previous study, possibly reflecting mild recurrent diverticulitis. 4. No other acute findings in the abdomen or pelvis. 5.  Aortic Atherosclerosis (ICD10-I70.0). 6. Critical Value/emergent results were called by telephone at the time of interpretation on 12/16/2023 at 4:47 pm to Dr Nicholaus in the [facility] ED, who verbally acknowledged these results. Electronically Signed   By: Elsie Perone M.D.   On: 12/16/2023 16:48   CT Head Wo Contrast Result Date: 12/16/2023 CLINICAL DATA:  Mental status change, unknown cause EXAM: CT HEAD WITHOUT CONTRAST TECHNIQUE: Contiguous axial images were obtained from the base of the skull through the vertex without intravenous contrast. RADIATION DOSE REDUCTION: This exam was performed according to the departmental dose-optimization program which includes automated exposure control, adjustment of the mA and/or kV according to patient size and/or use of iterative reconstruction technique. COMPARISON:  10/20/2023 FINDINGS: Brain: There is atrophy and chronic small vessel disease changes. No acute intracranial abnormality. Specifically, no hemorrhage, hydrocephalus, mass lesion, acute infarction, or significant intracranial injury. Vascular: No hyperdense vessel or unexpected calcification. Skull: No acute calvarial  abnormality. Sinuses/Orbits: No acute findings Other: None IMPRESSION: Atrophy, chronic microvascular disease. No acute intracranial abnormality. Electronically Signed   By: Franky Crease M.D.   On: 12/16/2023 12:57   DG Chest Port 1 View Result Date: 12/16/2023 CLINICAL DATA:  Altered mental status.  Clinical concern for sepsis. EXAM: PORTABLE CHEST 1 VIEW COMPARISON:  10/15/2023 FINDINGS: Poor inspiration. Normal-sized heart. Stable left jugular porta catheter with its tip in the region of the brachiocephalic/SVC junction. Minimal right basilar atelectasis. Diffuse osteopenia. Mild lower thoracic spine degenerative changes. Cholecystectomy clips. IMPRESSION: Poor inspiration with minimal right basilar atelectasis. Electronically Signed  By: Elspeth Bathe M.D.   On: 12/16/2023 11:50    Microbiology: Results for orders placed or performed during the hospital encounter of 12/16/23  Blood Culture (routine x 2)     Status: None (Preliminary result)   Collection Time: 12/16/23 11:31 AM   Specimen: BLOOD  Result Value Ref Range Status   Specimen Description BLOOD RIGHT HAND  Final   Special Requests   Final    BOTTLES DRAWN AEROBIC AND ANAEROBIC Blood Culture results may not be optimal due to an inadequate volume of blood received in culture bottles   Culture   Final    NO GROWTH 3 DAYS Performed at [facility], [address] [place], [place] [address]    Report Status PENDING  Incomplete  Blood Culture (routine x 2)     Status: None (Preliminary result)   Collection Time: 12/16/23 11:31 AM   Specimen: BLOOD  Result Value Ref Range Status   Specimen Description BLOOD BLOOD LEFT HAND  Final   Special Requests   Final    BOTTLES DRAWN AEROBIC AND ANAEROBIC Blood Culture adequate volume   Culture   Final    NO GROWTH 3 DAYS Performed at [facility], [address] [place], [place] [address]    Report Status PENDING  Incomplete  MRSA Next Gen by PCR, Nasal     Status:  None   Collection Time: 12/16/23  5:45 PM   Specimen: Nasal Mucosa; Nasal Swab  Result Value Ref Range Status   MRSA by PCR Next Gen NOT DETECTED NOT DETECTED Final    Comment: (NOTE) The GeneXpert MRSA Assay (FDA approved for NASAL specimens only), is one component of a comprehensive MRSA colonization surveillance program. It is not intended to diagnose MRSA infection nor to guide or monitor treatment for MRSA infections. Test performance is not FDA approved in patients less than 27 years old. Performed at [facility], [address] [place], [place] [address]   Respiratory (~20 pathogens) panel by PCR     Status: None   Collection Time: 12/16/23  8:44 PM   Specimen: Nasopharyngeal Swab; Respiratory  Result Value Ref Range Status   Adenovirus NOT DETECTED NOT DETECTED Final   Coronavirus 229E NOT DETECTED NOT DETECTED Final    Comment: (NOTE) The Coronavirus on the Respiratory Panel, DOES NOT test for the novel  Coronavirus (2019 nCoV)    Coronavirus HKU1 NOT DETECTED NOT DETECTED Final   Coronavirus NL63 NOT DETECTED NOT DETECTED Final   Coronavirus OC43 NOT DETECTED NOT DETECTED Final   Metapneumovirus NOT DETECTED NOT DETECTED Final   Rhinovirus / Enterovirus NOT DETECTED NOT DETECTED Final   Influenza A NOT DETECTED NOT DETECTED Final   Influenza Murillo NOT DETECTED NOT DETECTED Final   Parainfluenza Virus 1 NOT DETECTED NOT DETECTED Final   Parainfluenza Virus 2 NOT DETECTED NOT DETECTED Final   Parainfluenza Virus 3 NOT DETECTED NOT DETECTED Final   Parainfluenza Virus 4 NOT DETECTED NOT DETECTED Final   Respiratory Syncytial Virus NOT DETECTED NOT DETECTED Final   Bordetella pertussis NOT DETECTED NOT DETECTED Final   Bordetella Parapertussis NOT DETECTED NOT DETECTED Final   Chlamydophila pneumoniae NOT DETECTED NOT DETECTED Final   Mycoplasma pneumoniae NOT DETECTED NOT DETECTED Final    Comment: Performed at [facility], [address],  [place], [place] [address]    Labs: CBC: Recent Labs  Lab 12/16/23 1131 12/17/23 0527 12/18/23 0313 12/19/23 0353  WBC 14.7* 10.9*  8.5 7.4  NEUTROABS 11.7*  --   --   --   HGB 11.4* 10.2* 9.8* 10.3*  HCT 33.9* 32.0* 29.4* 31.5*  MCV 89.0 91.4 88.3 88.7  PLT 171 165 173 204   Basic Metabolic Panel: Recent Labs  Lab 12/16/23 1131 12/17/23 0527 12/18/23 0313 12/19/23 0353  NA 134* 136 134* 138  K 3.5 3.6 3.4* 4.1  CL 97* 104 101 104  CO2 26 23 22  21*  GLUCOSE 130* 100* 87 99  BUN 12 11 15 13   CREATININE 0.91 0.89 0.88 0.79  CALCIUM  9.0 8.7* 8.7* 9.2  MG  --   --  2.2  --    Liver Function Tests: Recent Labs  Lab 12/16/23 1131 12/17/23 0527  AST 19 15  ALT 13 12  ALKPHOS 104 98  BILITOT 1.1 0.6  PROT 6.2* 6.1*  ALBUMIN 3.0* 3.0*   CBG: No results for input(s): GLUCAP in the last 168 hours.  Discharge time spent: greater than 30 minutes.  Signed: Burnard DELENA Cunning, DO [facility] 12/19/2023

## 2023-12-19 NOTE — Plan of Care (Signed)
   Problem: Education: Goal: Knowledge of General Education information will improve Description: Including pain rating scale, medication(s)/side effects and non-pharmacologic comfort measures Outcome: Completed/Met   Problem: Health Behavior/Discharge Planning: Goal: Ability to manage health-related needs will improve Outcome: Completed/Met   Problem: Clinical Measurements: Goal: Ability to maintain clinical measurements within normal limits will improve Outcome: Completed/Met Goal: Will remain free from infection Outcome: Completed/Met Goal: Diagnostic test results will improve Outcome: Completed/Met Goal: Respiratory complications will improve Outcome: Completed/Met Goal: Cardiovascular complication will be avoided Outcome: Completed/Met   Problem: Activity: Goal: Risk for activity intolerance will decrease Outcome: Completed/Met   Problem: Nutrition: Goal: Adequate nutrition will be maintained Outcome: Completed/Met   Problem: Coping: Goal: Level of anxiety will decrease Outcome: Completed/Met   Problem: Elimination: Goal: Will not experience complications related to bowel motility Outcome: Completed/Met Goal: Will not experience complications related to urinary retention Outcome: Completed/Met   Problem: Pain Managment: Goal: General experience of comfort will improve and/or be controlled Outcome: Completed/Met   Problem: Safety: Goal: Ability to remain free from injury will improve Outcome: Completed/Met   Problem: Skin Integrity: Goal: Risk for impaired skin integrity will decrease Outcome: Completed/Met   Problem: Activity: Goal: Ability to tolerate increased activity will improve Outcome: Completed/Met   Problem: Clinical Measurements: Goal: Ability to maintain a body temperature in the normal range will improve Outcome: Completed/Met   Problem: Respiratory: Goal: Ability to maintain adequate ventilation will improve Outcome: Completed/Met Goal:  Ability to maintain a clear airway will improve Outcome: Completed/Met

## 2023-12-19 NOTE — TOC Progression Note (Signed)
 Transition of Care [facility]) - Progression Note    Patient Details  Name: Stacy Murillo MRN: [id number] Date of Birth: [birth year]  [facility]) CM/SW Contact  Lauraine JAYSON Carpen, LCSW Phone Number: 12/19/2023, 12:17 PM  Clinical Narrative:  Patient can return to SNF today if mitten and IV are removed. They also need discharge summary by 1:30. Sent secure chat to attending physician to notify.   Expected Discharge Plan: Skilled Nursing Facility Barriers to Discharge: Continued Medical Work up               Expected Discharge Plan and Services     Post Acute Care Choice: Resumption of Svcs/PTA Provider Living arrangements for the past 2 months: Skilled Nursing Facility                                       Social Drivers of Health (SDOH) Interventions SDOH Screenings   Food Insecurity: No Food Insecurity (12/16/2023)  Housing: Low Risk  (12/16/2023)  Transportation Needs: No Transportation Needs (12/16/2023)  Utilities: Not At Risk (12/16/2023)  Alcohol Screen: Low Risk  (07/08/2023)  Depression (PHQ2-9): Low Risk  (08/13/2023)  Financial Resource Strain: Low Risk  (12/04/2023)   Received from [facility]  Physical Activity: Inactive (07/08/2023)  Social Connections: Unknown (12/16/2023)  Recent Concern: Social Connections - Moderately Isolated (10/15/2023)  Stress: No Stress Concern Present (07/08/2023)  Tobacco Use: Low Risk  (12/16/2023)  Health Literacy: Inadequate Health Literacy (07/08/2023)    Readmission Risk Interventions     No data to display

## 2023-12-19 NOTE — TOC Transition Note (Signed)
 Transition of [facility]) - Discharge Note   Patient Details  Name: Stacy Murillo MRN: [id number] Date of Birth: Jul 20, 1955  Transition of Care [facility]) CM/SW Contact:  Lauraine JAYSON Carpen, LCSW Phone Number: 12/19/2023, 1:39 PM   Clinical Narrative:  Patient has orders to discharge back to [facility] today. Their RN will call unit RN for report. [facility] has been arranged and she is 2nd on the list. No further concerns. CSW signing off.   Final next level of care: Skilled Nursing Facility Barriers to Discharge: Barriers Resolved   Patient Goals and CMS Choice     Choice offered to / list presented to : Spouse      Discharge Placement   Existing PASRR number confirmed : 12/18/23          Patient chooses bed at:  [facility]) Patient to be transferred to facility by: [facility] Name of family member notified: Joseph Brunei Murillo Patient and family notified of of transfer: 12/19/23  Discharge Plan and Services Additional resources added to the After Visit Summary for       Post Acute Care Choice: Resumption of Svcs/PTA Provider                               Social Drivers of Health (SDOH) Interventions SDOH Screenings   Food Insecurity: No Food Insecurity (12/16/2023)  Housing: Low Risk  (12/16/2023)  Transportation Needs: No Transportation Needs (12/16/2023)  Utilities: Not At Risk (12/16/2023)  Alcohol Screen: Low Risk  (07/08/2023)  Depression (PHQ2-9): Low Risk  (08/13/2023)  Financial Resource Strain: Low Risk  (12/04/2023)   Received from [facility]  Physical Activity: Inactive (07/08/2023)  Social Connections: Unknown (12/16/2023)  Recent Concern: Social Connections - Moderately Isolated (10/15/2023)  Stress: No Stress Concern Present (07/08/2023)  Tobacco Use: Low Risk  (12/16/2023)  Health Literacy: Inadequate Health Literacy (07/08/2023)     Readmission Risk Interventions     No data to  display

## 2023-12-21 LAB — CULTURE, BLOOD (ROUTINE X 2)
Culture: NO GROWTH
Culture: NO GROWTH
Special Requests: ADEQUATE

## 2023-12-27 ENCOUNTER — Encounter: Payer: Self-pay | Admitting: Internal Medicine

## 2023-12-27 NOTE — Telephone Encounter (Signed)
 FYI

## 2023-12-30 NOTE — Telephone Encounter (Signed)
 Would you like for me to cancel the appt on Oct 1st?

## 2023-12-31 NOTE — Telephone Encounter (Signed)
 Canceled.

## 2024-01-30 ENCOUNTER — Telehealth: Payer: Self-pay | Admitting: Internal Medicine

## 2024-01-30 NOTE — Telephone Encounter (Signed)
 Pt spouse just called. They wanted to cancel her appts on 10/3 and did not wish to r/s at this time. Said that they want to get her port removed too. Pt is now at an assisted living facility. [facility]

## 2024-02-12 ENCOUNTER — Ambulatory Visit: Admitting: Internal Medicine

## 2024-02-13 ENCOUNTER — Ambulatory Visit: Admitting: Family

## 2024-02-14 ENCOUNTER — Other Ambulatory Visit

## 2024-02-14 ENCOUNTER — Ambulatory Visit: Admitting: Internal Medicine

## 2024-02-14 ENCOUNTER — Encounter

## 2024-04-01 ENCOUNTER — Other Ambulatory Visit: Payer: Self-pay

## 2024-04-01 ENCOUNTER — Emergency Department

## 2024-04-01 ENCOUNTER — Inpatient Hospital Stay
Admission: EM | Admit: 2024-04-01 | Discharge: 2024-04-06 | DRG: 689 | Disposition: A | Discharge status: Skilled Nursing Facility | Attending: Internal Medicine | Admitting: Internal Medicine

## 2024-04-01 DIAGNOSIS — Z7401 Bed confinement status: Secondary | ICD-10-CM

## 2024-04-01 DIAGNOSIS — R7989 Other specified abnormal findings of blood chemistry: Secondary | ICD-10-CM | POA: Insufficient documentation

## 2024-04-01 DIAGNOSIS — F05 Delirium due to known physiological condition: Secondary | ICD-10-CM | POA: Diagnosis present

## 2024-04-01 DIAGNOSIS — Z7901 Long term (current) use of anticoagulants: Secondary | ICD-10-CM

## 2024-04-01 DIAGNOSIS — M810 Age-related osteoporosis without current pathological fracture: Secondary | ICD-10-CM | POA: Diagnosis present

## 2024-04-01 DIAGNOSIS — I2489 Other forms of acute ischemic heart disease: Secondary | ICD-10-CM | POA: Diagnosis present

## 2024-04-01 DIAGNOSIS — Z808 Family history of malignant neoplasm of other organs or systems: Secondary | ICD-10-CM

## 2024-04-01 DIAGNOSIS — I5A Non-ischemic myocardial injury (non-traumatic): Secondary | ICD-10-CM | POA: Diagnosis present

## 2024-04-01 DIAGNOSIS — E1110 Type 2 diabetes mellitus with ketoacidosis without coma: Secondary | ICD-10-CM | POA: Diagnosis present

## 2024-04-01 DIAGNOSIS — R4182 Altered mental status, unspecified: Secondary | ICD-10-CM | POA: Diagnosis not present

## 2024-04-01 DIAGNOSIS — Z9049 Acquired absence of other specified parts of digestive tract: Secondary | ICD-10-CM

## 2024-04-01 DIAGNOSIS — Z807 Family history of other malignant neoplasms of lymphoid, hematopoietic and related tissues: Secondary | ICD-10-CM

## 2024-04-01 DIAGNOSIS — Z993 Dependence on wheelchair: Secondary | ICD-10-CM

## 2024-04-01 DIAGNOSIS — F0390 Unspecified dementia without behavioral disturbance: Secondary | ICD-10-CM

## 2024-04-01 DIAGNOSIS — G9341 Metabolic encephalopathy: Secondary | ICD-10-CM | POA: Diagnosis present

## 2024-04-01 DIAGNOSIS — Z8249 Family history of ischemic heart disease and other diseases of the circulatory system: Secondary | ICD-10-CM

## 2024-04-01 DIAGNOSIS — M1990 Unspecified osteoarthritis, unspecified site: Secondary | ICD-10-CM | POA: Diagnosis present

## 2024-04-01 DIAGNOSIS — Z885 Allergy status to narcotic agent status: Secondary | ICD-10-CM

## 2024-04-01 DIAGNOSIS — Z8572 Personal history of non-Hodgkin lymphomas: Secondary | ICD-10-CM

## 2024-04-01 DIAGNOSIS — R4189 Other symptoms and signs involving cognitive functions and awareness: Secondary | ICD-10-CM | POA: Diagnosis present

## 2024-04-01 DIAGNOSIS — Z825 Family history of asthma and other chronic lower respiratory diseases: Secondary | ICD-10-CM

## 2024-04-01 DIAGNOSIS — Z888 Allergy status to other drugs, medicaments and biological substances status: Secondary | ICD-10-CM

## 2024-04-01 DIAGNOSIS — N390 Urinary tract infection, site not specified: Secondary | ICD-10-CM | POA: Diagnosis not present

## 2024-04-01 DIAGNOSIS — Z79899 Other long term (current) drug therapy: Secondary | ICD-10-CM

## 2024-04-01 DIAGNOSIS — Z800 Family history of malignant neoplasm of digestive organs: Secondary | ICD-10-CM

## 2024-04-01 DIAGNOSIS — E870 Hyperosmolality and hypernatremia: Secondary | ICD-10-CM | POA: Diagnosis present

## 2024-04-01 DIAGNOSIS — K219 Gastro-esophageal reflux disease without esophagitis: Secondary | ICD-10-CM | POA: Diagnosis present

## 2024-04-01 DIAGNOSIS — C8339 Primary central nervous system lymphoma: Secondary | ICD-10-CM | POA: Diagnosis present

## 2024-04-01 DIAGNOSIS — C8583 Other specified types of non-Hodgkin lymphoma, intra-abdominal lymph nodes: Secondary | ICD-10-CM | POA: Diagnosis present

## 2024-04-01 DIAGNOSIS — Z1152 Encounter for screening for COVID-19: Secondary | ICD-10-CM

## 2024-04-01 DIAGNOSIS — B9620 Unspecified Escherichia coli [E. coli] as the cause of diseases classified elsewhere: Secondary | ICD-10-CM | POA: Diagnosis present

## 2024-04-01 DIAGNOSIS — Z86711 Personal history of pulmonary embolism: Secondary | ICD-10-CM

## 2024-04-01 LAB — COMPREHENSIVE METABOLIC PANEL WITH GFR
ALT: 12 U/L (ref 0–44)
AST: 15 U/L (ref 15–41)
Albumin: 4.1 g/dL (ref 3.5–5.0)
Alkaline Phosphatase: 77 U/L (ref 38–126)
Anion gap: 14 (ref 5–15)
BUN: 23 mg/dL (ref 8–23)
CO2: 25 mmol/L (ref 22–32)
Calcium: 9.6 mg/dL (ref 8.9–10.3)
Chloride: 102 mmol/L (ref 98–111)
Creatinine, Ser: 0.84 mg/dL (ref 0.44–1.00)
GFR, Estimated: >60 mL/min (ref >60)
Glucose, Bld: 88 mg/dL (ref 70–99)
Potassium: 4 mmol/L (ref 3.5–5.1)
Sodium: 141 mmol/L (ref 135–145)
Total Bilirubin: 0.5 mg/dL (ref 0.0–1.2)
Total Protein: 6.4 g/dL — ABNORMAL LOW (ref 6.5–8.1)

## 2024-04-01 LAB — CBC
HCT: 37.4 % (ref 36.0–46.0)
Hemoglobin: 12.3 g/dL (ref 12.0–15.0)
MCH: 28.6 pg (ref 26.0–34.0)
MCHC: 32.9 g/dL (ref 30.0–36.0)
MCV: 87 fL (ref 80.0–100.0)
Platelets: 196 K/uL (ref 150–400)
RBC: 4.3 MIL/uL (ref 3.87–5.11)
RDW: 13.6 % (ref 11.5–15.5)
WBC: 10.8 K/uL — ABNORMAL HIGH (ref 4.0–10.5)
nRBC: 0 % (ref 0.0–0.2)

## 2024-04-01 NOTE — ED Provider Notes (Incomplete)
 [facility] Provider Note    Event Date/Time   First MD Initiated Contact with Patient 04/01/24 2340     (approximate)   History   Altered Mental Status   HPI  Stacy Murillo is a 68 y.o. female with history of dementia, B-cell lymphoma, PE and DVT on Eliquis  previously on methotrexate  and radiation who presents to the emergency department from her nursing facility with concerns for worsening mental status over the past 3 days.  Husband reports patient not able to eat and drink and they have noticed facial asymmetry and slurred speech.  No known fevers, vomiting or diarrhea but has been reports she has had a cough.  She denies any headache, chest pain or shortness of breath, abdominal pain.  No recent falls.  Patient has bruising to her hands and feet but no known traumatic injury and denies any pain.  Has reports she is bedbound at baseline.  Husband does report that they recently started her on Depakote a few weeks ago due to agitation.  No other new medications.    History provided by patient and husband.    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   Leg cramps    Lymphoma (HCC) 09/14/2015   Monoclonal B cell lymphoma   Night sweats    Osteoporosis    Overweight(278.02)    Obesity   Palpitations    Shortness of breath dyspnea    Tachycardia    Thrombocytopenia     Past Surgical History:  Procedure Laterality Date   ABDOMINAL HYSTERECTOMY  2005   BONE MARROW BIOPSY  09/14/15   CHOLECYSTECTOMY  1997   COLONOSCOPY  07/05/2014   ESOPHAGOGASTRODUODENOSCOPY  07/05/2014   HIP PINNING,CANNULATED Left 10/16/2023   Procedure: FIXATION, FEMUR, NECK,  PERCUTANEOUS, USING SCREW;  Surgeon: Edie Norleen PARAS, MD;  Location: [facility] ORS;  Service: Orthopedics;  Laterality: Left;   INGUINAL LYMPH NODE BIOPSY Right 10/05/2015   Procedure: INGUINAL LYMPH NODE BIOPSY;  Surgeon: Louanne KANDICE Muse, MD;  Location: [facility] ORS;  Service: General;  Laterality: Right;   PERIPHERAL VASCULAR CATHETERIZATION N/A 09/27/2015   Procedure: Pat Cath Insertion;  Surgeon: Selinda GORMAN Gu, MD;  Location: [facility] INVASIVE CV LAB;  Service: Cardiovascular;  Laterality: N/A;   PORTA CATH REMOVAL Right 06/03/2017   Procedure: PORTA CATH REMOVAL, with venous thrombectomy;  Surgeon: Gu Selinda GORMAN, MD;  Location: [facility] INVASIVE CV LAB;  Service: Cardiovascular;  Laterality: Right;   PORTACATH PLACEMENT Right     MEDICATIONS:  Prior to Admission medications   Medication Sig Start Date End Date Taking? Authorizing Provider  acetaminophen  (TYLENOL ) 650 MG CR tablet Take 650 mg by mouth every 8 (eight) hours as needed for pain.    [provider]  apixaban  (ELIQUIS ) 5 MG TABS tablet Take 2 tablets (10 mg total) by mouth 2 (two) times daily for 6 days, THEN 1 tablet (5 mg total) 2 (two) times daily for 24 days. 12/19/23 01/18/24  Fausto Burnard LABOR, DO  ARIPiprazole  (ABILIFY ) 5 MG tablet Take 5 mg by mouth daily.    [provider]  busPIRone  (BUSPAR ) 15 MG tablet TAKE 1 TABLET BY MOUTH  3 TIMES DAILY 10/24/23   Marylynn Verneita CROME, MD  fesoterodine  (TOVIAZ ) 4 MG TB24 tablet Take 1 tablet (4 mg total) by mouth daily. 08/27/23   Marylynn Verneita CROME, MD  Menthol -Zinc Oxide 0.44-20.625 % OINT Apply topically 2 (two) times daily.    [provider]  traMADol  (ULTRAM ) 50 MG tablet Take 1 tablet (50 mg total) by mouth every 6 (six) hours as needed. 12/19/23   Fausto Burnard LABOR, DO  triamcinolone  cream (KENALOG ) 0.1 % Apply 1 Application topically daily. 08/13/23   Marylynn Verneita CROME, MD    Physical Exam   Triage Vital Signs: ED Triage Vitals  Encounter Vitals Group     BP 04/01/24 1706  124/62     Girls Systolic BP Percentile --      Girls Diastolic BP Percentile --      Boys Systolic BP Percentile --      Boys Diastolic BP Percentile --      Pulse Rate 04/01/24 1706 89     Resp 04/01/24 1706 17     Temp 04/01/24 1706 98.6 F (37 C)     Temp Source 04/01/24 1706 Oral     SpO2 04/01/24 1706 100 %     Weight 04/01/24 1717 169 lb 5 oz (76.8 kg)     Height 04/01/24 1717 5' 3 (1.6 m)     Head Circumference --      Peak Flow --      Pain Score --      Pain Loc --      Pain Education --      Exclude from Growth Chart --     Most recent vital signs: Vitals:   04/02/24 0100 04/02/24 0400  BP: 110/64 (!) 131/57  Pulse: 81 74  Resp: 15 19  Temp:    SpO2: 96% 96%    CONSTITUTIONAL: Alert, chronically ill-appearing, appears older than stated age, confused HEAD: Normocephalic, atraumatic EYES: Conjunctivae clear, pupils appear equal, sclera nonicteric ENT: normal nose; moist mucous membranes NECK: Supple, normal ROM CARD: RRR; S1 and S2 appreciated RESP: Normal chest excursion without splinting or tachypnea; breath sounds clear and equal bilaterally; no wheezes, no rhonchi, no rales, no hypoxia or respiratory distress, speaking full sentences ABD/GI: Non-distended; soft, non-tender, no rebound, no guarding, no peritoneal signs BACK: The back appears normal EXT: Normal ROM in all joints; no deformity noted, no edema, bruising noted to the dorsal feet and right dorsal hand without bony deformity or tenderness, no calf swelling or calf tenderness SKIN: Normal color for age and race; warm; no rash on exposed skin NEURO: Moves all extremities equally, patient appears to have a mild facial droop and dysarthria.  Difficult to test for aphasia due to her dementia.  Also difficult to reliably test sensation. PSYCH: The patient's mood and manner are appropriate.   ED Results / Procedures / Treatments   LABS: (all labs ordered are listed, but only abnormal results are  displayed) Labs Reviewed  COMPREHENSIVE METABOLIC PANEL WITH GFR - Abnormal; Notable for the following components:      Result Value   Total Protein 6.4 (*)    All other components within normal limits  URINALYSIS, ROUTINE W REFLEX MICROSCOPIC - Abnormal; Notable for the following components:   Color, Urine YELLOW (*)    APPearance TURBID (*)    Specific Gravity, Urine >1.046 (*)    Hgb urine dipstick SMALL (*)    Ketones, ur 20 (*)    Protein, ur 100 (*)  Nitrite POSITIVE (*)    Leukocytes,Ua MODERATE (*)    Bacteria, UA MANY (*)    All other components within normal limits  CBC - Abnormal; Notable for the following components:   WBC 10.8 (*)    All other components within normal limits  T4, FREE - Abnormal; Notable for the following components:   Free T4 1.37 (*)    All other components within normal limits  VALPROIC ACID LEVEL - Abnormal; Notable for the following components:   Valproic Acid Lvl 22 (*)    All other components within normal limits  TROPONIN T, HIGH SENSITIVITY - Abnormal; Notable for the following components:   Troponin T High Sensitivity 55 (*)    All other components within normal limits  TROPONIN T, HIGH SENSITIVITY - Abnormal; Notable for the following components:   Troponin T High Sensitivity 43 (*)    All other components within normal limits  TROPONIN T, HIGH SENSITIVITY - Abnormal; Notable for the following components:   Troponin T High Sensitivity 49 (*)    All other components within normal limits  RESP PANEL BY RT-PCR (RSV, FLU A&B, COVID)  RVPGX2  URINE CULTURE  TSH  CBG MONITORING, ED     EKG:  EKG Interpretation Date/Time:  Wednesday April 01 2024 17:37:58 EST Ventricular Rate:  85 PR Interval:    QRS Duration:  66 QT Interval:  324 QTC Calculation: 385 R Axis:   -3  Text Interpretation: Normal sinus rhythm Low voltage QRS Abnormal ECG When compared with ECG of 18-Dec-2023 06:57, Nonspecific T wave abnormality no longer evident  in Inferior leads Confirmed by Neomi Neptune [phone]) on 04/01/2024 11:52:09 PM         RADIOLOGY: My personal review and interpretation of imaging: MRI shows no stroke, malignancy.  Chest x-ray clear.  I have personally reviewed all radiology reports.   MR Brain W and Wo Contrast Result Date: 04/02/2024 EXAM: MRI BRAIN WITH AND WITHOUT CONTRAST 04/02/2024 01:51:49 AM TECHNIQUE: Multiplanar multisequence MRI of the head/brain was performed with and without the administration of intravenous contrast. COMPARISON: MRI Head Dec 17, 2023 CLINICAL HISTORY: Neuro deficit, acute, stroke suspected; slurred speech, facial droop x 3 days, h/o lymphoma FINDINGS: BRAIN AND VENTRICLES: No acute infarct. No acute intracranial hemorrhage. Similar remote hemorrhage in left frontal lobe. No mass effect or midline shift. No hydrocephalus. Similar advanced T2 hyperintensity in the white matter. Normal flow voids. No mass or abnormal enhancement. ORBITS: No acute abnormality. SINUSES: No acute abnormality. BONES AND SOFT TISSUES: Normal bone marrow signal and enhancement. IMPRESSION: 1. Stable MRI without evidence of acute abnormality or residual/recurrent lymphoma. Electronically signed by: Gilmore Molt MD 04/02/2024 02:12 AM EST RP Workstation: [id number]   DG Chest Portable 1 View Result Date: 04/02/2024 EXAM: 1 VIEW(S) XRAY OF THE CHEST 04/02/2024 12:24:00 AM COMPARISON: CXR 12/16/2023 CLINICAL HISTORY: cough, AMS FINDINGS: LINES, TUBES AND DEVICES: Tunneled left IJ Port-A-Cath in place with tip overlying the expected region of the confluence of the left brachiocephalic and superior vena cava. LUNGS AND PLEURA: Low lung volumes. No focal pulmonary opacity. No pleural effusion. No pneumothorax. HEART AND MEDIASTINUM: No acute abnormality of the cardiac and mediastinal silhouettes. BONES AND SOFT TISSUES: No acute osseous abnormality. Cholecystectomy clips noted. IMPRESSION: 1. No acute cardiopulmonary process.  Electronically signed by: Morgane Naveau MD 04/02/2024 12:44 AM EST RP Workstation: [id number]   CT Head Wo Contrast Result Date: 04/01/2024 EXAM: CT HEAD WITHOUT CONTRAST 04/01/2024 06:18:37 PM TECHNIQUE: CT of the head was  performed without the administration of intravenous contrast. Automated exposure control, iterative reconstruction, and/or weight based adjustment of the mA/kV was utilized to reduce the radiation dose to as low as reasonably achievable. COMPARISON: 12/16/2023 CLINICAL HISTORY: Altered mental status, nontraumatic (Ped 0-17y). FINDINGS: BRAIN AND VENTRICLES: No acute hemorrhage. No evidence of acute infarct. Proportional prominence of ventricles and sulci, consistent with diffuse cerebral parenchymal volume loss. Extensive periventricular and subcortical white matter hypoattenuation, most consistent with severe chronic ischemic microvascular disease. Calcified atherosclerotic plaque within cavernous/supraclinoid internal carotid arteries. No hydrocephalus. No extra-axial collection. No mass effect or midline shift. ORBITS: No acute abnormality. SINUSES: No acute abnormality. SOFT TISSUES AND SKULL: No acute soft tissue abnormality. No skull fracture. IMPRESSION: 1. No acute intracranial abnormality. 2. Severe chronic ischemic microvascular disease. 3. Diffuse cerebral parenchymal volume loss. Electronically signed by: Donnice Mania MD 04/01/2024 06:51 PM EST RP Workstation: [id number]     PROCEDURES:  Critical Care performed: No      .1-3 Lead EKG Interpretation  Performed by: Mery Guadalupe, Josette SAILOR, DO Authorized by: Rayshon Albaugh, Josette SAILOR, DO     Interpretation: normal     ECG rate:  81   ECG rate assessment: normal     Rhythm: sinus rhythm     Ectopy: none     Conduction: normal       IMPRESSION / MDM / ASSESSMENT AND PLAN / ED COURSE  I reviewed the triage vital signs and the nursing notes.    Patient here for worsening altered mental status from her baseline.  The  patient is on the cardiac monitor to evaluate for evidence of arrhythmia and/or significant heart rate changes.   DIFFERENTIAL DIAGNOSIS (includes but not limited to):   Stroke, intracranial hemorrhage, UTI, dehydration, anemia, electrolyte derangement, thyroid  dysfunction, polypharmacy, Depakote toxicity, malignancy   Patient's presentation is most consistent with acute presentation with potential threat to life or bodily function.   PLAN: Labs show no significant anemia, electrolyte derangement and normal creatinine.  CT head reviewed and interpreted by myself and the radiologist and is unremarkable.  Will add on additional blood work including Depakote level, thyroid  function studies and obtain catheterized urine specimen.  Discussed with husband patient's goals of care and he states he is not sure at this time but would be interested in figuring out what is going on before making decisions on what they would want to do as a next steps.  Given her history of lymphoma with what he states was in her brain with new neurodeficits today including facial droop and dysarthria, have recommended an MRI of her brain with and without contrast.  He is agreeable to proceed.  He states that he would like to have results back before deciding if he would want to have her admitted to the hospital for further treatment.   MEDICATIONS GIVEN IN ED: Medications  0.9 %  sodium chloride  infusion ( Intravenous New Bag/Given 04/02/24 0418)  sodium chloride  0.9 % bolus 1,000 mL (0 mLs Intravenous Stopped 04/02/24 0406)  gadobutrol  (GADAVIST ) 1 MMOL/ML injection 7.5 mL (7.5 mLs Intravenous Contrast Given 04/02/24 0144)  LORazepam  (ATIVAN ) injection 1 mg (1 mg Intravenous Given 04/02/24 0231)  cefTRIAXone  (ROCEPHIN ) 2 g in sodium chloride  0.9 % 100 mL IVPB (0 g Intravenous Stopped 04/02/24 0457)     ED COURSE: Patient has a nitrite positive UTI.  Culture pending.  Will give Rocephin .  Will continue IV hydration  here.  MRI brain reviewed and interpreted by myself and the radiologist and shows  no stroke, malignancy.  Chest x-ray clear.  COVID, flu and RSV negative.  Depakote level 22.  Patient did become agitated trying to strike staff here.  Has been reports that this is not uncommon for her.  Did require IV Ativan  for agitation.  Has been comfortable with plan for admission for IV hydration and antibiotics.   Repeat troponins here have been flat.   5:30 AM  Pt's nurse reports CCMD called to state patient was bradycardic in the 20s briefly and it look like it may have been in an secondary heart block.  Sleeping during this episode with normal blood pressure.  Now heart rate is in the 70s.  Repeat EKG shows no AV block, arrhythmia, ischemia.  Will continue to closely monitor.  She is on cardiac monitoring still.  CONSULTS:  Consulted and discussed patient's case with hospitalist, Dr. Cleatus.  I have recommended admission and consulting physician agrees and will place admission orders.  Patient (and family if present) agree with this plan.   I reviewed all nursing notes, vitals, pertinent previous records.  All labs, EKGs, imaging ordered have been independently reviewed and interpreted by myself.    OUTSIDE RECORDS REVIEWED: Reviewed recent internal medicine notes.      FINAL CLINICAL IMPRESSION(S) / ED DIAGNOSES   Final diagnoses:  Altered mental status, unspecified altered mental status type  Acute UTI     Rx / DC Orders   ED Discharge Orders     None        Note:  This document was prepared using Dragon voice recognition software and may include unintentional dictation errors.   Finnegan Gatta, Josette SAILOR, DO 04/02/24 0355    Yosiel Thieme, Josette SAILOR, DO 04/02/24 0452    Noheli Melder, Josette SAILOR, DO 04/02/24 9466

## 2024-04-01 NOTE — ED Triage Notes (Addendum)
 Pt arrived via [facility] with c/o AMS that started today but per spouse pt has not been her normal self for the last several days, pt has been real sleepy and lethargic. Pt has dementia at baseline.

## 2024-04-01 NOTE — ED Triage Notes (Signed)
 Pt comes via EMS from [facility] with c/o slurred speech for 3 days and AMS for three days. Pt had also had dec intake. Pt does have dementia. Pt combative when touch.   Pt normally gets up and gets in wheelchair. Pt has hx of aphasia. Pt hasn't been up in few days and not at normal.

## 2024-04-02 ENCOUNTER — Other Ambulatory Visit: Payer: Self-pay | Admitting: Internal Medicine

## 2024-04-02 ENCOUNTER — Emergency Department

## 2024-04-02 ENCOUNTER — Other Ambulatory Visit: Payer: Self-pay

## 2024-04-02 DIAGNOSIS — N390 Urinary tract infection, site not specified: Secondary | ICD-10-CM

## 2024-04-02 DIAGNOSIS — K219 Gastro-esophageal reflux disease without esophagitis: Secondary | ICD-10-CM | POA: Diagnosis present

## 2024-04-02 DIAGNOSIS — Z807 Family history of other malignant neoplasms of lymphoid, hematopoietic and related tissues: Secondary | ICD-10-CM | POA: Diagnosis not present

## 2024-04-02 DIAGNOSIS — C8339 Primary central nervous system lymphoma: Secondary | ICD-10-CM

## 2024-04-02 DIAGNOSIS — G9341 Metabolic encephalopathy: Secondary | ICD-10-CM | POA: Diagnosis present

## 2024-04-02 DIAGNOSIS — F05 Delirium due to known physiological condition: Secondary | ICD-10-CM

## 2024-04-02 DIAGNOSIS — M810 Age-related osteoporosis without current pathological fracture: Secondary | ICD-10-CM | POA: Diagnosis present

## 2024-04-02 DIAGNOSIS — E870 Hyperosmolality and hypernatremia: Secondary | ICD-10-CM | POA: Diagnosis present

## 2024-04-02 DIAGNOSIS — R7989 Other specified abnormal findings of blood chemistry: Secondary | ICD-10-CM | POA: Insufficient documentation

## 2024-04-02 DIAGNOSIS — M1990 Unspecified osteoarthritis, unspecified site: Secondary | ICD-10-CM | POA: Diagnosis present

## 2024-04-02 DIAGNOSIS — I5A Non-ischemic myocardial injury (non-traumatic): Secondary | ICD-10-CM | POA: Diagnosis present

## 2024-04-02 DIAGNOSIS — Z993 Dependence on wheelchair: Secondary | ICD-10-CM | POA: Diagnosis not present

## 2024-04-02 DIAGNOSIS — Z7901 Long term (current) use of anticoagulants: Secondary | ICD-10-CM | POA: Diagnosis not present

## 2024-04-02 DIAGNOSIS — B9620 Unspecified Escherichia coli [E. coli] as the cause of diseases classified elsewhere: Secondary | ICD-10-CM | POA: Diagnosis present

## 2024-04-02 DIAGNOSIS — Z86711 Personal history of pulmonary embolism: Secondary | ICD-10-CM | POA: Insufficient documentation

## 2024-04-02 DIAGNOSIS — Z1152 Encounter for screening for COVID-19: Secondary | ICD-10-CM | POA: Diagnosis not present

## 2024-04-02 DIAGNOSIS — E1110 Type 2 diabetes mellitus with ketoacidosis without coma: Secondary | ICD-10-CM | POA: Insufficient documentation

## 2024-04-02 DIAGNOSIS — Z8249 Family history of ischemic heart disease and other diseases of the circulatory system: Secondary | ICD-10-CM | POA: Diagnosis not present

## 2024-04-02 DIAGNOSIS — Z825 Family history of asthma and other chronic lower respiratory diseases: Secondary | ICD-10-CM | POA: Diagnosis not present

## 2024-04-02 DIAGNOSIS — N3000 Acute cystitis without hematuria: Secondary | ICD-10-CM

## 2024-04-02 DIAGNOSIS — R4182 Altered mental status, unspecified: Secondary | ICD-10-CM | POA: Diagnosis present

## 2024-04-02 DIAGNOSIS — Z79899 Other long term (current) drug therapy: Secondary | ICD-10-CM | POA: Diagnosis not present

## 2024-04-02 DIAGNOSIS — Z7401 Bed confinement status: Secondary | ICD-10-CM | POA: Diagnosis not present

## 2024-04-02 DIAGNOSIS — Z885 Allergy status to narcotic agent status: Secondary | ICD-10-CM | POA: Diagnosis not present

## 2024-04-02 DIAGNOSIS — Z888 Allergy status to other drugs, medicaments and biological substances status: Secondary | ICD-10-CM | POA: Diagnosis not present

## 2024-04-02 DIAGNOSIS — Z9049 Acquired absence of other specified parts of digestive tract: Secondary | ICD-10-CM | POA: Diagnosis not present

## 2024-04-02 DIAGNOSIS — F0390 Unspecified dementia without behavioral disturbance: Secondary | ICD-10-CM

## 2024-04-02 DIAGNOSIS — I2489 Other forms of acute ischemic heart disease: Secondary | ICD-10-CM | POA: Diagnosis present

## 2024-04-02 DIAGNOSIS — R4189 Other symptoms and signs involving cognitive functions and awareness: Secondary | ICD-10-CM | POA: Diagnosis present

## 2024-04-02 DIAGNOSIS — Z800 Family history of malignant neoplasm of digestive organs: Secondary | ICD-10-CM | POA: Diagnosis not present

## 2024-04-02 LAB — RESP PANEL BY RT-PCR (RSV, FLU A&B, COVID)  RVPGX2
Influenza A by PCR: NEGATIVE
Influenza B by PCR: NEGATIVE
Resp Syncytial Virus by PCR: NEGATIVE
SARS Coronavirus 2 by RT PCR: NEGATIVE

## 2024-04-02 LAB — VALPROIC ACID LEVEL: Valproic Acid Lvl: 22 ug/mL — ABNORMAL LOW (ref 50–100)

## 2024-04-02 LAB — URINALYSIS, ROUTINE W REFLEX MICROSCOPIC
Bilirubin Urine: NEGATIVE
Glucose, UA: NEGATIVE mg/dL
Ketones, ur: 20 mg/dL — AB
Nitrite: POSITIVE — AB
Protein, ur: 100 mg/dL — AB
Specific Gravity, Urine: >1.046 — ABNORMAL HIGH (ref 1.005–1.030)
Squamous Epithelial / HPF: 0 /HPF (ref 0–5)
WBC, UA: >50 WBC/hpf (ref 0–5)
pH: 5 (ref 5.0–8.0)

## 2024-04-02 LAB — TROPONIN T, HIGH SENSITIVITY
Troponin T High Sensitivity: 55 ng/L — ABNORMAL HIGH (ref 0–19)
Troponin T High Sensitivity: 43 ng/L — ABNORMAL HIGH (ref 0–19)
Troponin T High Sensitivity: 49 ng/L — ABNORMAL HIGH (ref 0–19)

## 2024-04-02 LAB — TSH: TSH: 3.67 u[IU]/mL (ref 0.350–4.500)

## 2024-04-02 LAB — T4, FREE: Free T4: 1.37 ng/dL — ABNORMAL HIGH (ref 0.61–1.12)

## 2024-04-02 MED ORDER — SODIUM CHLORIDE 0.9 % IV BOLUS (SEPSIS)
1000.0000 mL | Freq: Once | INTRAVENOUS | Status: AC
  Administered 2024-04-02: 1000 mL via INTRAVENOUS

## 2024-04-02 MED ORDER — SODIUM CHLORIDE 0.9 % IV SOLN
INTRAVENOUS | Status: DC
Start: 2024-04-02 — End: 2024-04-02

## 2024-04-02 MED ORDER — QUETIAPINE FUMARATE 25 MG PO TABS
25.0000 mg | ORAL_TABLET | Freq: Every day | ORAL | Status: DC
  Administered 2024-04-02: 25 mg via ORAL
  Filled 2024-04-02: qty 1

## 2024-04-02 MED ORDER — BUSPIRONE HCL 10 MG PO TABS
15.0000 mg | ORAL_TABLET | Freq: Three times a day (TID) | ORAL | Status: DC
  Administered 2024-04-02 – 2024-04-04 (×5): 15 mg via ORAL
  Filled 2024-04-02 (×5): qty 2

## 2024-04-02 MED ORDER — SODIUM CHLORIDE 0.9 % IV SOLN
1.0000 g | INTRAVENOUS | Status: DC
  Administered 2024-04-03 – 2024-04-04 (×2): 1 g via INTRAVENOUS
  Filled 2024-04-02 (×2): qty 10

## 2024-04-02 MED ORDER — SENNOSIDES-DOCUSATE SODIUM 8.6-50 MG PO TABS
2.0000 | ORAL_TABLET | Freq: Two times a day (BID) | ORAL | Status: DC
  Administered 2024-04-02 – 2024-04-06 (×8): 2 via ORAL
  Filled 2024-04-02 (×8): qty 2

## 2024-04-02 MED ORDER — LORAZEPAM 2 MG/ML IJ SOLN
1.0000 mg | Freq: Once | INTRAMUSCULAR | Status: AC
  Administered 2024-04-02: 1 mg via INTRAVENOUS
  Filled 2024-04-02: qty 1

## 2024-04-02 MED ORDER — DEXTROSE IN LACTATED RINGERS 5 % IV SOLN: INTRAVENOUS | Status: DC

## 2024-04-02 MED ORDER — SODIUM CHLORIDE 0.9 % IV SOLN
2.0000 g | Freq: Once | INTRAVENOUS | Status: AC
  Administered 2024-04-02: 2 g via INTRAVENOUS
  Filled 2024-04-02: qty 20

## 2024-04-02 MED ORDER — APIXABAN 5 MG PO TABS
5.0000 mg | ORAL_TABLET | Freq: Two times a day (BID) | ORAL | Status: DC
  Administered 2024-04-02 – 2024-04-06 (×8): 5 mg via ORAL
  Filled 2024-04-02 (×8): qty 1

## 2024-04-02 MED ORDER — ARIPIPRAZOLE 2 MG PO TABS
5.0000 mg | ORAL_TABLET | Freq: Every day | ORAL | Status: DC
  Administered 2024-04-03 – 2024-04-04 (×2): 5 mg via ORAL
  Filled 2024-04-02 (×2): qty 3

## 2024-04-02 MED ORDER — DIVALPROEX SODIUM 125 MG PO DR TAB
125.0000 mg | DELAYED_RELEASE_TABLET | Freq: Two times a day (BID) | ORAL | Status: DC
  Administered 2024-04-02 – 2024-04-04 (×4): 125 mg via ORAL
  Filled 2024-04-02 (×5): qty 1

## 2024-04-02 MED ORDER — GADOBUTROL 1 MMOL/ML IV SOLN
7.5000 mL | Freq: Once | INTRAVENOUS | Status: AC | PRN
  Administered 2024-04-02: 7.5 mL via INTRAVENOUS

## 2024-04-02 NOTE — H&P (Addendum)
 History and Physical    Patient: Stacy Murillo FMW:[id number] DOB: 02-Jan-1956 DOA: 04/01/2024 DOS: the patient was seen and examined on 04/02/2024 PCP: Lenon Layman ORN, MD  Patient coming from: SNF  Chief Complaint:  Chief Complaint  Patient presents with   Altered Mental Status   HPI: Stacy Murillo is a 68 y.o. female with medical history significant of CNS lymphoma, severe dementia with occasional agitation, gastroesophageal reflux disease, recent PE, who came to the hospital with worsening mental status, sleepiness, slurred speech.  She had abnormal urine, urine culture was sent, patient was started on Rocephin .  Spoke with the husband, patient currently living in a nursing home for rehab, at baseline, she has severe debility with wheelchair-bound status, she has severe dementia with occasional agitation.  She has baseline confusion, but for the last 3 days, she is very confused.  She has been sleeping for the last 3 days, she has not been eating.  She did not have any nausea vomiting or fever or chills.  She did not have any diarrhea. She had MRI of the brain with contrast, did not show any stroke or CNS lymphoma.  WBC 10.8, elevated troponin 55.  Review of Systems: As mentioned in the history of present illness. All other systems reviewed and are negative. 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   Leg cramps    Lymphoma (HCC) 09/14/2015   Monoclonal B cell lymphoma   Night sweats    Osteoporosis    Overweight(278.02)    Obesity   Palpitations    Shortness of breath dyspnea    Tachycardia    Thrombocytopenia    Past Surgical History:  Procedure Laterality Date    ABDOMINAL HYSTERECTOMY  2005   BONE MARROW BIOPSY  09/14/15   CHOLECYSTECTOMY  1997   COLONOSCOPY  07/05/2014   ESOPHAGOGASTRODUODENOSCOPY  07/05/2014   HIP PINNING,CANNULATED Left 10/16/2023   Procedure: FIXATION, FEMUR, NECK, PERCUTANEOUS, USING SCREW;  Surgeon: Edie Norleen PARAS, MD;  Location: [facility];  Service: Orthopedics;  Laterality: Left;   INGUINAL LYMPH NODE BIOPSY Right 10/05/2015   Procedure: INGUINAL LYMPH NODE BIOPSY;  Surgeon: Louanne KANDICE Muse, MD;  Location: [facility];  Service: General;  Laterality: Right;   PERIPHERAL VASCULAR CATHETERIZATION N/A 09/27/2015   Procedure: Pat Cath Insertion;  Surgeon: Selinda GORMAN Gu, MD;  Location: [facility];  Service: Cardiovascular;  Laterality: N/A;   PORTA CATH REMOVAL Right 06/03/2017   Procedure: PORTA CATH REMOVAL, with venous thrombectomy;  Surgeon: Gu Selinda GORMAN, MD;  Location: [facility];  Service: Cardiovascular;  Laterality: Right;   PORTACATH PLACEMENT Right    Social History:  reports that she has never smoked. She has never used smokeless tobacco. She reports that she does not drink alcohol and does not use drugs.  Allergies  Allergen Reactions   Ondansetron  Hcl Other (See Comments)    Severe constipation    Oxycodone      Biliary colic with opioids s/p cholecystectomy     Rituximab  Other (See Comments)    Pt reports throat tightness and facial swelling and redness during infusion of Rituxan     Family History  Problem  Relation Age of Onset   Heart disease Father        CABG x 4   Multiple myeloma Father    Hypertension Mother    Pancreatic cancer Mother    Ulcers Mother    Asthma Mother    Brain cancer Maternal Uncle    Multiple myeloma Maternal Uncle    Diabetes Neg Hx    Breast cancer Neg Hx     Prior to Admission medications   Medication Sig Start Date End Date Taking? Authorizing Provider  acetaminophen  (TYLENOL ) 650 MG CR tablet Take 650 mg by mouth every 8 (eight) hours as needed for pain.    Yes [provider]  apixaban  (ELIQUIS ) 5 MG TABS tablet Take 2 tablets (10 mg total) by mouth 2 (two) times daily for 6 days, THEN 1 tablet (5 mg total) 2 (two) times daily for 24 days. 12/19/23 04/02/24 Yes Fausto Sor A, DO  ARIPiprazole  (ABILIFY ) 5 MG tablet Take 5 mg by mouth daily.   Yes [provider]  busPIRone  (BUSPAR ) 15 MG tablet TAKE 1 TABLET BY MOUTH 3 TIMES DAILY 10/24/23  Yes Tullo, Teresa L, MD  divalproex (DEPAKOTE) 125 MG DR tablet Take 125 mg by mouth 2 (two) times daily. 03/09/24  Yes [provider]  fesoterodine  (TOVIAZ ) 4 MG TB24 tablet Take 1 tablet (4 mg total) by mouth daily. 08/27/23  Yes Marylynn Verneita CROME, MD  Menthol -Zinc Oxide 0.44-20.625 % OINT Apply topically 2 (two) times daily.   Yes [provider]  LORazepam  (ATIVAN ) 0.5 MG tablet Take 0.5 mg by mouth once. Patient not taking: Reported on 04/02/2024 04/01/24   [provider]  traMADol  (ULTRAM ) 50 MG tablet Take 1 tablet (50 mg total) by mouth every 6 (six) hours as needed. Patient not taking: Reported on 04/02/2024 12/19/23   Fausto Sor A, DO  triamcinolone  cream (KENALOG ) 0.1 % Apply 1 Application topically daily. Patient not taking: Reported on 04/02/2024 08/13/23   Marylynn Verneita CROME, MD    Physical Exam: Vitals:   04/02/24 0058 04/02/24 0100 04/02/24 0400 04/02/24 0811  BP: 115/69 110/64 (!) 131/57   Pulse: 79 81 74   Resp: 17 15 19    Temp: 97.7 F (36.5 C)   (!) 97.4 F (36.3 C)  TempSrc: Axillary   Axillary  SpO2: 98% 96% 96%   Weight:      Height:       Physical Exam Constitutional:      Appearance: She is ill-appearing. She is not toxic-appearing.     Comments: Drowsy and confused.  With occasional agitation.  HENT:     Head: Normocephalic and atraumatic.     Nose: Nose normal. No congestion.     Mouth/Throat:     Mouth: Mucous membranes are moist.     Pharynx: Oropharynx is clear.  Eyes:     Extraocular Movements: Extraocular movements  intact.     Conjunctiva/sclera: Conjunctivae normal.     Pupils: Pupils are equal, round, and reactive to light.  Neck:     Vascular: No carotid bruit.  Cardiovascular:     Rate and Rhythm: Normal rate and regular rhythm.     Heart sounds: No murmur heard.    No gallop.  Pulmonary:     Effort: Pulmonary effort is normal. No respiratory distress.     Breath sounds: Normal breath sounds. No wheezing or rales.  Abdominal:     General: Abdomen is flat. Bowel sounds are normal. There is no  distension.     Palpations: Abdomen is soft.     Tenderness: There is no abdominal tenderness.  Musculoskeletal:        General: No swelling or tenderness.     Cervical back: Normal range of motion and neck supple. No rigidity or tenderness.     Right lower leg: No edema.     Left lower leg: No edema.  Lymphadenopathy:     Cervical: No cervical adenopathy.  Skin:    General: Skin is warm and dry.     Coloration: Skin is not jaundiced.  Neurological:     General: No focal deficit present.     Mental Status: She is disoriented.  Psychiatric:     Comments: Very drowsy and flat affect.     Data Reviewed:  Reviewed MRI as above, lab results reviewed.  Assessment and Plan: Acute metabolic encephalopathy.  Likely secondary to UTI. Severe dementia with delirium. Urinary tract infection. Patient has baseline dementia, has worsening altered mental status than baseline.  This most likely secondary to UTI. Brain MRI did not show any CNS lymphoma or stroke. Patient also has severe dementia, which has contributed to her altered mental status. Patient has not been eating due to altered mental status, will start IV fluids to prevent dehydration.  I also added D5 to prevent hypoglycemia. Patient received 2 g of Rocephin  in the emergency room this morning, will continue 1 g daily.  Urine culture sent out, pending result. Patient currently treated with Abilify  for delirium.  Will continue, also added  Seroquel  to improve sleep at night.  CNS lymphoma. Does not seem to have any recurrence at this time.  Recent PE. Continue Eliquis .  Elevated troponin. Likely secondary to acute illness from demand ischemia.  Troponin trending  down, no suspicion for acute coronary syndrome.  Severe debility with wheelchair bound status. TOC consulted for returning to SNF when stable.   DKA was a mistake placed by admission last night.    Advance Care Planning:   Code Status: Full Code discussed with the husband, POA, patient is still full code.  Recommended DNR status, he will think about it.  Consults: None  Family Communication: Husband updated over the phone.  Severity of Illness: The appropriate patient status for this patient is INPATIENT. Inpatient status is judged to be reasonable and necessary in order to provide the required intensity of service to ensure the patient's safety. The patient's presenting symptoms, physical exam findings, and initial radiographic and laboratory data in the context of their chronic comorbidities is felt to place them at high risk for further clinical deterioration. Furthermore, it is not anticipated that the patient will be medically stable for discharge from the hospital within 2 midnights of admission.   * I certify that at the point of admission it is my clinical judgment that the patient will require inpatient hospital care spanning beyond 2 midnights from the point of admission due to high intensity of service, high risk for further deterioration and high frequency of surveillance required.*  Author: Murvin Mana, MD 04/02/2024 8:30 AM  For on call review [email or web address].
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
## 2024-04-02 NOTE — ED Notes (Signed)
 NURSE VET INFORMED Of BED Assigned

## 2024-04-02 NOTE — TOC Initial Note (Signed)
 Transition of Care [facility]) - Initial/Assessment Note    Patient Details  Name: Stacy Murillo MRN: [id number] Date of Birth: February 14, 1956  Transition [facility] Care [facility]) CM/SW Contact:    Alvaro Louder, LCSW Phone Number: 04/02/2024, 4:28 PM  Clinical Narrative:           Per chart review patient is from [facility] PCP is Layman Piety. Patient will return to [facility] at [place].   TOC to follow for discharge         Patient Goals and CMS Choice            Expected Discharge Plan and Services                                              Prior Living Arrangements/Services                       Activities of Daily Living   ADL Screening (condition at time of admission) Independently performs ADLs?: No Does the patient have a NEW difficulty with bathing/dressing/toileting/self-feeding that is expected to last >3 days?: No Does the patient have a NEW difficulty with getting in/out of bed, walking, or climbing stairs that is expected to last >3 days?: No Does the patient have a NEW difficulty with communication that is expected to last >3 days?: No  Permission Sought/Granted                  Emotional Assessment              Admission diagnosis:  DKA (diabetic ketoacidosis) (HCC) [E11.10] Acute UTI [N39.0] Altered mental status, unspecified altered mental status type [R41.82] Acute metabolic encephalopathy [G93.41] Patient Active Problem List   Diagnosis Date Noted   DKA (diabetic ketoacidosis) (HCC) 04/02/2024   Elevated troponin 04/02/2024   UTI (urinary tract infection) 04/02/2024   History of pulmonary embolism 04/02/2024   Delirium with dementia (HCC) 04/02/2024   Sepsis (HCC) 12/18/2023   Heart block AV complete (HCC) 12/18/2023   Acute metabolic encephalopathy 12/17/2023   Acute hypoxic respiratory failure (HCC) 12/17/2023   PE (pulmonary thromboembolism) (HCC) 12/16/2023   Acute urinary retention 10/18/2023   Femur fracture  (HCC) 10/15/2023   CKD (chronic kidney disease), stage III (HCC) 10/15/2023   Seborrheic eczema of scalp 08/13/2023   Port-A-Cath in place 05/17/2022   Generalized weakness    Altered mental status 02/25/2022   Fall at home, initial encounter 02/25/2022   Gait instability 02/25/2022   Dehydration 02/25/2022   COVID-19 05/09/2021   Hypotension, chronic 03/25/2021   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   Primary CNS lymphoma (HCC) 03/08/2019   Goals of care, counseling/discussion 03/08/2019   Lesion of frontal lobe of brain 02/19/2019   Weight gain, abnormal 12/23/2018   Generalized anxiety disorder 02/09/2018   History of autologous stem cell transplant (HCC) 01/15/2018   Immunization due 01/15/2018   Vaginal dryness, menopausal 12/18/2017   Stem cell transplant candidate 05/30/2017   Diverticulitis large intestine w/o perforation or abscess w/bleeding 02/17/2017   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   DVT (deep venous thrombosis) (HCC) 10/27/2015   Generalized headaches 10/27/2015   Oral mucositis due to antineoplastic therapy 10/14/2015   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) 09/21/2015   Left knee pain 07/20/2014   Esophageal reflux 04/06/2014   Special screening for malignant neoplasms, colon 04/06/2014   Screening for breast cancer 07/10/2011   Osteopenia 07/06/2011   Vitamin D  deficiency 07/06/2011   Hyperlipidemia 07/06/2011   Disorder of bone and cartilage 07/06/2011   Osteoporosis    Overweight 10/19/2008   PCP:  Lenon Layman ORN, MD Pharmacy:  No Pharmacies Listed    Social Drivers of Health (SDOH) Social History: SDOH Screenings   Food Insecurity: No Food Insecurity (04/02/2024)  Housing: Low Risk  (04/02/2024)  Transportation Needs: No Transportation Needs (04/02/2024)  Utilities: Not At Risk (04/02/2024)  Alcohol  Screen: Low Risk  (07/08/2023)  Depression (PHQ2-9): Low Risk  (08/13/2023)  Financial Resource Strain: Low Risk  (01/17/2024)   Received from [facility]  Physical Activity: Inactive (07/08/2023)  Social Connections: Unknown (04/02/2024)  Stress: No Stress Concern Present (07/08/2023)  Tobacco Use: Low Risk  (04/01/2024)  Health Literacy: Inadequate Health Literacy (07/08/2023)   SDOH Interventions:     Readmission Risk Interventions     No data to display

## 2024-04-02 NOTE — ED Notes (Signed)
 MRI technician in room to take pt to MRI.

## 2024-04-02 NOTE — ED Notes (Signed)
 Pt rolled on side, pericare provided to pt prior to transport.

## 2024-04-02 NOTE — Progress Notes (Signed)
 Pt has been lethargic since admission to the floor. The writer is unable to keep her awake long enough to safely swallow pill. Pts husband is at bedside and he agrees. The hospitalist has been notified and is aware.

## 2024-04-03 DIAGNOSIS — G9341 Metabolic encephalopathy: Secondary | ICD-10-CM | POA: Diagnosis not present

## 2024-04-03 LAB — COMPREHENSIVE METABOLIC PANEL WITH GFR
ALT: 10 U/L (ref 0–44)
AST: 11 U/L — ABNORMAL LOW (ref 15–41)
Albumin: 3.2 g/dL — ABNORMAL LOW (ref 3.5–5.0)
Alkaline Phosphatase: 59 U/L (ref 38–126)
Anion gap: 10 (ref 5–15)
BUN: 12 mg/dL (ref 8–23)
CO2: 29 mmol/L (ref 22–32)
Calcium: 9.1 mg/dL (ref 8.9–10.3)
Chloride: 108 mmol/L (ref 98–111)
Creatinine, Ser: 0.82 mg/dL (ref 0.44–1.00)
GFR, Estimated: >60 mL/min (ref >60)
Glucose, Bld: 113 mg/dL — ABNORMAL HIGH (ref 70–99)
Potassium: 4 mmol/L (ref 3.5–5.1)
Sodium: 147 mmol/L — ABNORMAL HIGH (ref 135–145)
Total Bilirubin: 0.4 mg/dL (ref 0.0–1.2)
Total Protein: 4.9 g/dL — ABNORMAL LOW (ref 6.5–8.1)

## 2024-04-03 LAB — CBC
HCT: 33.5 % — ABNORMAL LOW (ref 36.0–46.0)
Hemoglobin: 10.9 g/dL — ABNORMAL LOW (ref 12.0–15.0)
MCH: 28.7 pg (ref 26.0–34.0)
MCHC: 32.5 g/dL (ref 30.0–36.0)
MCV: 88.2 fL (ref 80.0–100.0)
Platelets: 155 K/uL (ref 150–400)
RBC: 3.8 MIL/uL — ABNORMAL LOW (ref 3.87–5.11)
RDW: 13.5 % (ref 11.5–15.5)
WBC: 8.3 K/uL (ref 4.0–10.5)
nRBC: 0 % (ref 0.0–0.2)

## 2024-04-03 LAB — MAGNESIUM: Magnesium: 1.8 mg/dL (ref 1.7–2.4)

## 2024-04-03 LAB — PHOSPHORUS: Phosphorus: 2.8 mg/dL (ref 2.5–4.6)

## 2024-04-03 MED ORDER — DEXTROSE 5 % IV SOLN: INTRAVENOUS | Status: AC

## 2024-04-03 NOTE — Plan of Care (Signed)
  Problem: Education: Goal: Knowledge of General Education information will improve Description: Including pain rating scale, medication(s)/side effects and non-pharmacologic comfort measures Outcome: Not Progressing   Problem: Health Behavior/Discharge Planning: Goal: Ability to manage health-related needs will improve Outcome: Not Progressing   Problem: Clinical Measurements: Goal: Ability to maintain clinical measurements within normal limits will improve Outcome: Progressing Goal: Will remain free from infection Outcome: Progressing Goal: Diagnostic test results will improve Outcome: Progressing Goal: Respiratory complications will improve Outcome: Not Applicable Goal: Cardiovascular complication will be avoided Outcome: Progressing   Problem: Activity: Goal: Risk for activity intolerance will decrease Outcome: Not Progressing   Problem: Nutrition: Goal: Adequate nutrition will be maintained Outcome: Not Progressing   Problem: Coping: Goal: Level of anxiety will decrease Outcome: Progressing   Problem: Elimination: Goal: Will not experience complications related to bowel motility Outcome: Progressing Goal: Will not experience complications related to urinary retention Outcome: Progressing   Problem: Pain Managment: Goal: General experience of comfort will improve and/or be controlled Outcome: Progressing   Problem: Safety: Goal: Ability to remain free from injury will improve Outcome: Progressing   Problem: Skin Integrity: Goal: Risk for impaired skin integrity will decrease Outcome: Progressing

## 2024-04-03 NOTE — Plan of Care (Signed)

## 2024-04-03 NOTE — Progress Notes (Signed)
 Progress Note   Patient: Stacy Murillo FMW:[id number] DOB: 02/04/1956 DOA: 04/01/2024     1 DOS: the patient was seen and examined on 04/03/2024   Brief hospital course:  Stacy Murillo is a 68 y.o. female with medical history significant of CNS lymphoma, severe dementia with occasional agitation, gastroesophageal reflux disease, recent PE, who came to the hospital with worsening mental status, sleepiness, slurred speech.  She had abnormal urine, urine culture was sent, patient was started on Rocephin .   Spoke with the husband, patient currently living in a nursing home for rehab, at baseline, she has severe debility with wheelchair-bound status, she has severe dementia with occasional agitation.  She has baseline confusion, but for the last 3 days, she is very confused.  She has been sleeping for the last 3 days, she has not been eating.  She did not have any nausea vomiting or fever or chills.  She did not have any diarrhea. She had MRI of the brain with contrast, did not show any stroke or CNS lymphoma.  WBC 10.8, elevated troponin 55.     Assessment and Plan:  Acute metabolic encephalopathy.  Likely secondary to UTI. Severe dementia with delirium. Urinary tract infection. Patient has baseline dementia and presents for evaluation of worsening mental status which her husband describes as lethargy and increased sleepiness. Noted to have pyuria on admission, prior urine culture yielded Klebsiella pneumonia Will continue empiric antibiotic therapy with IV Rocephin  and expect improvement in patient's mental status following treatment. Stable MRI without evidence of acute abnormality or residual/recurrent lymphoma. Continue Depakote , BuSpar  and Abilify  Hold Seroquel  due to lethargy    CNS lymphoma. Does not seem to have any recurrence at this time.   Recent PE. Continue Eliquis .   Myocardial injury Likely secondary to acute illness from demand ischemia.   Troponin is flat with no  suspicion for acute coronary syndrome.    Severe debility with wheelchair bound status. Long-term resident in a skilled nursing facility    Hypernatremia Mild Secondary to poor oral intake Administer free water  with dextrose  containing infusion      Subjective: Patient is seen and examined at the bedside.  Lethargic but arouses to verbal stimuli.  Physical Exam: Vitals:   04/02/24 0911 04/02/24 0920 04/03/24 0403 04/03/24 0803  BP: (!) 148/65 (!) 119/59 138/65 (!) 123/58  Pulse:  73 76 73  Resp:  19 17 17   Temp:   (!) 97.5 F (36.4 C) 98.2 F (36.8 C)  TempSrc:   Oral   SpO2:  100% 100% 100%  Weight:      Height:       Constitutional:      Appearance: She is chronically ill-appearing. She is not toxic-appearing.     Comments: Lethargic but arouses easily HENT:     Head: Normocephalic and atraumatic.     Nose: Nose normal. No congestion.     Mouth/Throat:     Mouth: Mucous membranes are moist.     Pharynx: Oropharynx is clear.  Eyes:     Extraocular Movements: Extraocular movements intact.     Conjunctiva/sclera: Conjunctivae normal.     Pupils: Pupils are equal, round, and reactive to light.  Neck:     Vascular: No carotid bruit.  Cardiovascular:     Rate and Rhythm: Normal rate and regular rhythm.     Heart sounds: No murmur heard.    No gallop.  Pulmonary:     Effort: Pulmonary effort is normal. No respiratory distress.  Breath sounds: Normal breath sounds. No wheezing or rales.  Abdominal:     General: Abdomen is flat. Bowel sounds are normal. There is no distension.     Palpations: Abdomen is soft.     Tenderness: There is no abdominal tenderness.  Musculoskeletal:        General: No swelling or tenderness.     Cervical back: Normal range of motion and neck supple. No rigidity or tenderness.     Right lower leg: No edema.     Left lower leg: No edema.  Lymphadenopathy:     Cervical: No cervical adenopathy.  Skin:    General: Skin is warm and  dry.     Coloration: Skin is not jaundiced.  Neurological:     General: Unable to assess    Mental Status:  Psychiatric:     Comments: Unable to assess   Data Reviewed: Labs reviewed.  Sodium 147, white count 8.3 Urine culture yields greater than 100,000 colonies of gram-negative rods, ID pending There are no new results to review at this time.  Family Communication: Plan of care discussed with patient's husband at the bedside.  All questions and concerns have been addressed.  Disposition: Status is: Inpatient Remains inpatient appropriate because: Awaiting results of urine culture  Planned Discharge Destination: Skilled nursing facility    Time spent: 40 minutes  Author: Aimee Somerset, MD 04/03/2024 1:44 PM  For on call review [email or web address].

## 2024-04-04 DIAGNOSIS — E1110 Type 2 diabetes mellitus with ketoacidosis without coma: Secondary | ICD-10-CM

## 2024-04-04 LAB — URINE CULTURE: Culture: >=100000 — AB

## 2024-04-04 LAB — BASIC METABOLIC PANEL WITH GFR
Anion gap: 9 (ref 5–15)
BUN: 6 mg/dL — ABNORMAL LOW (ref 8–23)
CO2: 30 mmol/L (ref 22–32)
Calcium: 9 mg/dL (ref 8.9–10.3)
Chloride: 105 mmol/L (ref 98–111)
Creatinine, Ser: 0.79 mg/dL (ref 0.44–1.00)
GFR, Estimated: >60 mL/min (ref >60)
Glucose, Bld: 96 mg/dL (ref 70–99)
Potassium: 3.8 mmol/L (ref 3.5–5.1)
Sodium: 144 mmol/L (ref 135–145)

## 2024-04-04 MED ORDER — MAGNESIUM SULFATE 2 GM/50ML IV SOLN
2.0000 g | Freq: Once | INTRAVENOUS | Status: AC
  Administered 2024-04-04: 2 g via INTRAVENOUS
  Filled 2024-04-04: qty 50

## 2024-04-04 MED ORDER — DEXTROSE 5 % IV SOLN: INTRAVENOUS | Status: AC

## 2024-04-04 MED ORDER — CEPHALEXIN 500 MG PO CAPS
500.0000 mg | ORAL_CAPSULE | Freq: Two times a day (BID) | ORAL | Status: DC
  Administered 2024-04-05 – 2024-04-06 (×3): 500 mg via ORAL
  Filled 2024-04-04 (×3): qty 1

## 2024-04-04 NOTE — Plan of Care (Signed)
  Problem: Education: Goal: Knowledge of General Education information will improve Description: Including pain rating scale, medication(s)/side effects and non-pharmacologic comfort measures Outcome: Not Progressing   Problem: Health Behavior/Discharge Planning: Goal: Ability to manage health-related needs will improve Outcome: Not Progressing   Problem: Clinical Measurements: Goal: Ability to maintain clinical measurements within normal limits will improve Outcome: Progressing Goal: Will remain free from infection Outcome: Progressing Goal: Diagnostic test results will improve Outcome: Progressing Goal: Cardiovascular complication will be avoided Outcome: Progressing   Problem: Activity: Goal: Risk for activity intolerance will decrease Outcome: Not Progressing   Problem: Coping: Goal: Level of anxiety will decrease Outcome: Progressing   Problem: Nutrition: Goal: Adequate nutrition will be maintained Outcome: Not Progressing   Problem: Elimination: Goal: Will not experience complications related to bowel motility Outcome: Progressing Goal: Will not experience complications related to urinary retention Outcome: Progressing   Problem: Pain Managment: Goal: General experience of comfort will improve and/or be controlled Outcome: Progressing   Problem: Safety: Goal: Ability to remain free from injury will improve Outcome: Progressing   Problem: Skin Integrity: Goal: Risk for impaired skin integrity will decrease Outcome: Progressing

## 2024-04-04 NOTE — Progress Notes (Signed)
 Progress Note   Patient: Stacy Murillo FMW:[id number] DOB: 06-16-1955 DOA: 04/01/2024     2 DOS: the patient was seen and examined on 04/04/2024   Brief hospital course:  Stacy Murillo is a 68 y.o. female with medical history significant of CNS lymphoma, severe dementia with occasional agitation, gastroesophageal reflux disease, recent PE, who came to the hospital with worsening mental status, sleepiness, slurred speech.  She had abnormal urine, urine culture was sent, patient was started on Rocephin .   Spoke with the husband, patient currently living in a nursing home for rehab, at baseline, she has severe debility with wheelchair-bound status, she has severe dementia with occasional agitation.  She has baseline confusion, but for the last 3 days, she is very confused.  She has been sleeping for the last 3 days, she has not been eating.  She did not have any nausea vomiting or fever or chills.  She did not have any diarrhea. She had MRI of the brain with contrast, did not show any stroke or CNS lymphoma.  WBC 10.8, elevated troponin 55.   Assessment and Plan:  Acute metabolic encephalopathy multifactorial and secondary to UTI as well as medication induced Severe dementia with delirium. Urinary tract infection. Patient has baseline dementia and presents for evaluation of worsening mental status which her husband describes as lethargy and increased sleepiness. Noted to have pyuria on admission, prior urine culture yielded Klebsiella pneumonia Urine culture yields pansensitive E. coli for which patient has been getting IV Rocephin  since admission Will switch IV Rocephin  to oral Keflex  to complete 2 more days of therapy Patient remains lethargic despite getting appropriate therapy for her UTI Will hold Depakote , buspirone  and Abilify  until mental status improves since patient remains lethargic Stable MRI without evidence of acute abnormality or residual/recurrent lymphoma.    CNS  lymphoma. Does not seem to have any recurrence at this time.   Recent PE. Continue Eliquis .   Myocardial injury Likely secondary to acute illness from demand ischemia.   Troponin is flat with no suspicion for acute coronary syndrome.     Severe debility with wheelchair bound status. Long-term resident in a skilled nursing facility     Hypernatremia Mild and secondary to decreased/poor oral fluid intake Secondary to poor oral intake Improved with free water  administration Gentle IV fluid hydration     Hypomagnesemia Supplement magnesium        Subjective: Remains lethargic but arouses easily  Physical Exam: Vitals:   04/03/24 1425 04/03/24 1937 04/04/24 0433 04/04/24 0728  BP: 125/67 132/72 (!) 153/88 135/64  Pulse: 70 74 76 67  Resp: 17 16 16 17   Temp: 98.4 F (36.9 C) 98.4 F (36.9 C) 98.1 F (36.7 C) 97.6 F (36.4 C)  TempSrc:      SpO2: 100% 100% 100% 100%  Weight:      Height:       Constitutional:      Appearance: She is chronically ill-appearing. She is not toxic-appearing.     Comments: Lethargic but arouses easily HENT:     Head: Normocephalic and atraumatic.     Nose: Nose normal. No congestion.     Mouth/Throat:     Mouth: Mucous membranes are moist.     Pharynx: Oropharynx is clear.  Eyes:     Extraocular Movements: Extraocular movements intact.     Conjunctiva/sclera: Conjunctivae normal.     Pupils: Pupils are equal, round, and reactive to light.  Neck:     Vascular: No carotid bruit.  Cardiovascular:     Rate and Rhythm: Normal rate and regular rhythm.     Heart sounds: No murmur heard.    No gallop.  Pulmonary:     Effort: Pulmonary effort is normal. No respiratory distress.     Breath sounds: Normal breath sounds. No wheezing or rales.  Abdominal:     General: Abdomen is flat. Bowel sounds are normal. There is no distension.     Palpations: Abdomen is soft.     Tenderness: There is no abdominal tenderness.  Musculoskeletal:         General: No swelling or tenderness.     Cervical back: Normal range of motion and neck supple. No rigidity or tenderness.     Right lower leg: No edema.     Left lower leg: No edema.  Lymphadenopathy:     Cervical: No cervical adenopathy.  Skin:    General: Skin is warm and dry.     Coloration: Skin is not jaundiced.  Neurological:     General: Unable to assess    Mental Status:  Psychiatric:     Comments: Unable to assess   Data Reviewed: Labs reviewed.  Sodium 144, magnesium  1.8 Labs reviewed  Family Communication: Plan of care was discussed with patient's husband over the phone.  Agrees with holding her antipsychotics for now.  All questions and concerns have been addressed  Disposition: Status is: Inpatient Remains inpatient appropriate because: Management of mental status changes  Planned Discharge Destination: Skilled nursing facility    Time spent: 50 minutes  Author: Aimee Somerset, MD 04/04/2024 1:35 PM  For on call review [email or web address].

## 2024-04-05 DIAGNOSIS — G9341 Metabolic encephalopathy: Secondary | ICD-10-CM | POA: Diagnosis not present

## 2024-04-05 NOTE — Plan of Care (Signed)
  Problem: Education: Goal: Knowledge of General Education information will improve Description: Including pain rating scale, medication(s)/side effects and non-pharmacologic comfort measures Outcome: Not Progressing   Problem: Health Behavior/Discharge Planning: Goal: Ability to manage health-related needs will improve Outcome: Not Progressing   Problem: Clinical Measurements: Goal: Ability to maintain clinical measurements within normal limits will improve Outcome: Not Progressing Goal: Will remain free from infection Outcome: Not Progressing Goal: Diagnostic test results will improve Outcome: Not Progressing Goal: Cardiovascular complication will be avoided Outcome: Not Progressing   Problem: Activity: Goal: Risk for activity intolerance will decrease Outcome: Not Progressing   Problem: Nutrition: Goal: Adequate nutrition will be maintained Outcome: Not Progressing   Problem: Coping: Goal: Level of anxiety will decrease Outcome: Not Progressing   Problem: Elimination: Goal: Will not experience complications related to bowel motility Outcome: Not Progressing Goal: Will not experience complications related to urinary retention Outcome: Not Progressing   Problem: Pain Managment: Goal: General experience of comfort will improve and/or be controlled Outcome: Not Progressing   Problem: Safety: Goal: Ability to remain free from injury will improve Outcome: Not Progressing   Problem: Skin Integrity: Goal: Risk for impaired skin integrity will decrease Outcome: Not Progressing

## 2024-04-05 NOTE — Plan of Care (Signed)

## 2024-04-05 NOTE — Care Management Important Message (Signed)
 Important Message  Patient Details  Name: Stacy Murillo MRN: [id number] Date of Birth: 1955-11-04   Important Message Given:  Yes - Medicare IM     Mallory Enriques W, CMA 04/05/2024, 1:32 PM

## 2024-04-05 NOTE — Progress Notes (Signed)
 Progress Note   Patient: Stacy Murillo FMW:[id number] DOB: 25-Oct-1955 DOA: 04/01/2024     3 DOS: the patient was seen and examined on 04/05/2024   Brief hospital course:  Stacy Murillo is a 68 y.o. female with medical history significant of CNS lymphoma, severe dementia with occasional agitation, gastroesophageal reflux disease, recent PE, who came to the hospital with worsening mental status, sleepiness, slurred speech.  She had abnormal urine, urine culture was sent, patient was started on Rocephin .   Spoke with the husband, patient currently living in a nursing home for rehab, at baseline, she has severe debility with wheelchair-bound status, she has severe dementia with occasional agitation.  She has baseline confusion, but for the last 3 days, she is very confused.  She has been sleeping for the last 3 days, she has not been eating.  She did not have any nausea vomiting or fever or chills.  She did not have any diarrhea. She had MRI of the brain with contrast, did not show any stroke or CNS lymphoma.  WBC 10.8, elevated troponin 55.      Assessment and Plan:  Acute metabolic encephalopathy multifactorial and secondary to UTI as well as medication induced Severe dementia with delirium. Urinary tract infection. Patient has baseline dementia and presented for evaluation of worsening mental status which her husband describes as lethargy and increased sleepiness. Noted to have pyuria on admission, prior urine culture yielded Klebsiella pneumonia Urine culture yields pansensitive E. coli for which patient has been getting IV Rocephin  since admission IV Rocephin  was switched to oral Keflex  to complete 2 more days of therapy Patient remained lethargic despite getting appropriate therapy for her UTI, her antipsychotics were placed on hold with some improvement in her mental status and patient is currently back to her baseline. She will need adjustment of her antipsychotics on discharge Stable  MRI without evidence of acute abnormality or residual/recurrent lymphoma.     CNS lymphoma. Does not seem to have any recurrence at this time.    Recent PE. Continue Eliquis .    Myocardial injury Likely secondary to acute illness from demand ischemia.   Troponin is flat with no suspicion for acute coronary syndrome.     Severe debility with wheelchair bound status. Long-term resident in a skilled nursing facility     Hypernatremia Mild and secondary to decreased/poor oral fluid intake Secondary to poor oral intake Improved with free water  administration Gentle IV fluid hydration     Hypomagnesemia Supplement magnesium          Subjective: Awake.  Oriented to person but not to place or time.  Husband at the bedside.  Physical Exam: Vitals:   04/04/24 0728 04/04/24 2112 04/05/24 0359 04/05/24 0836  BP: 135/64 105/66 105/85 137/67  Pulse: 67 75 79 71  Resp: 17 16 18 17   Temp: 97.6 F (36.4 C) 97.7 F (36.5 C) 97.9 F (36.6 C) 97.9 F (36.6 C)  TempSrc:  Oral  Oral  SpO2: 100% 100% 98% 98%  Weight:      Height:       Constitutional:      Appearance: She is chronically ill-appearing. She is not toxic-appearing.     Comments: Lethargic but arouses easily HENT:     Head: Normocephalic and atraumatic.     Nose: Nose normal. No congestion.     Mouth/Throat:     Mouth: Mucous membranes are moist.     Pharynx: Oropharynx is clear.  Eyes:     Extraocular  Movements: Extraocular movements intact.     Conjunctiva/sclera: Conjunctivae normal.     Pupils: Pupils are equal, round, and reactive to light.  Neck:     Vascular: No carotid bruit.  Cardiovascular:     Rate and Rhythm: Normal rate and regular rhythm.     Heart sounds: No murmur heard.    No gallop.  Pulmonary:     Effort: Pulmonary effort is normal. No respiratory distress.     Breath sounds: Normal breath sounds. No wheezing or rales.  Abdominal:     General: Abdomen is flat. Bowel sounds are  normal. There is no distension.     Palpations: Abdomen is soft.     Tenderness: There is no abdominal tenderness.  Musculoskeletal:        General: No swelling or tenderness.     Cervical back: Normal range of motion and neck supple. No rigidity or tenderness.     Right lower leg: No edema.     Left lower leg: No edema.  Lymphadenopathy:     Cervical: No cervical adenopathy.  Skin:    General: Skin is warm and dry.     Coloration: Skin is not jaundiced.  Neurological:     General: Unable to assess    Mental Status:  Psychiatric:     Comments: Unable to assess   Data Reviewed:   There are no new results to review at this time.  Family Communication: Plan of care was discussed with patient's husband at the bedside.  He verbalizes understanding and agrees to the plan.  Disposition: Status is: Inpatient Remains inpatient appropriate because: For discharge in a.m.  Planned Discharge Destination: Skilled nursing facility    Time spent: 35 minutes  Author: Aimee Somerset, MD 04/05/2024 12:35 PM  For on call review [email or web address].

## 2024-04-05 NOTE — TOC Progression Note (Signed)
 Transition of Care [facility]) - Progression Note    Patient Details  Name: Stacy Murillo MRN: [id number] Date of Birth: 1956/03/27  Transition of [facility] CM/SW Contact  Victory Jackquline RAMAN, RN Phone Number: 04/05/2024, 11:10 AM  Clinical Narrative:    11:45 am: [facility] received a message via secure chat from the MD asking if the patient can return to [facility] and if we needed an auth.   10:56 am: [facility] called [facility] @ [phone] and spoke to Tara and asked if the patient would be able to return to the facility today. Rexene said that she would have to talk to the SW or Admissions Director. Awaiting call back.  11:04 am: RNCM received a call back from Tara and she said that she spoke to the Clay the SW and her DON and they both said that the patient would have to return Monday, 04/06/2024 and to call Luke Oman SW @ [phone] to set up patient's return to the facility. MD made aware.   11:06 am: RNCM spoke to patient's husband Fairy @ [phone], introduced role and explained that discharge planning would be discussed. Informed him that the patient will be discharged tomorrow pending facilities acceptance. Fairy verbalized understanding. He said that the patient will need ambulance transportation. RNCM will continue to follow for discharge planning needs.      Expected Discharge Plan: Skilled Nursing Facility Barriers to Discharge: Continued Medical Work up               Expected Discharge Plan and Services In-house Referral: Clinical Social Work                                             Social Drivers of Health (SDOH) Interventions SDOH Screenings   Food Insecurity: No Food Insecurity (04/02/2024)  Housing: Low Risk  (04/02/2024)  Transportation Needs: No Transportation Needs (04/02/2024)  Utilities: Not At Risk (04/02/2024)  Alcohol Screen: Low Risk  (07/08/2023)  Depression (PHQ2-9): Low Risk  (08/13/2023)  Financial Resource Strain: Low Risk   (01/17/2024)   Received from [facility]  Physical Activity: Inactive (07/08/2023)  Social Connections: Unknown (04/02/2024)  Stress: No Stress Concern Present (07/08/2023)  Tobacco Use: Low Risk  (04/01/2024)  Health Literacy: Inadequate Health Literacy (07/08/2023)    Readmission Risk Interventions     No data to display

## 2024-04-06 DIAGNOSIS — G9341 Metabolic encephalopathy: Secondary | ICD-10-CM | POA: Diagnosis not present

## 2024-04-06 MED ORDER — SENNOSIDES-DOCUSATE SODIUM 8.6-50 MG PO TABS: 2.0000 | ORAL_TABLET | Freq: Two times a day (BID) | ORAL | 0 refills | Status: AC

## 2024-04-06 NOTE — Discharge Summary (Signed)
 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 *  Hospital Course:  Stacy Murillo is a 68 y.o. female with medical history significant of CNS lymphoma, severe dementia with occasional agitation, gastroesophageal reflux disease, recent PE, who came to the hospital with worsening mental status, sleepiness, slurred speech.  She had abnormal urine, urine culture was sent, patient was started on Rocephin .   Spoke with the husband, patient currently living in a nursing home for rehab, at baseline, she has severe debility with wheelchair-bound status, she has severe dementia with occasional agitation.  She has baseline confusion, but for the last 3 days, she is very confused.  She has been sleeping for the last 3 days, she has not been eating.  She did not have any nausea vomiting or fever or chills.  She did not have any diarrhea. She had MRI of the brain with contrast, did not show any stroke or CNS lymphoma.  WBC 10.8, elevated troponin 55.    Assessment and Plan:  Acute metabolic encephalopathy multifactorial and secondary to UTI as well as medication induced Severe dementia with delirium. Urinary tract infection. Patient has baseline dementia and presented for evaluation of worsening mental status which her husband describes as lethargy and increased sleepiness. Noted to have pyuria on admission,  prior urine culture yielded Klebsiella pneumonia Urine culture yields pansensitive E. coli for which patient has been getting IV Rocephin  since admission IV Rocephin  was switched to oral Keflex  and patient has completed therapy Patient remained lethargic despite getting appropriate therapy for her UTI, her antipsychotics were placed on hold with return of her mental status back to baseline.   Will hold Depakote .  Continue Abilify  and buspirone . Depakote  may be resumed by PCP if indicated Stable MRI without evidence of acute abnormality or residual/recurrent lymphoma.     CNS lymphoma. Does not seem to have any recurrence at this time.     Recent PE. Continue Eliquis .     Myocardial injury Likely secondary to acute illness from demand ischemia.   Troponin is flat with no suspicion for acute coronary syndrome.     Severe debility with wheelchair bound status. Long-term resident in a skilled nursing facility     Hypernatremia Mild and secondary to decreased/poor oral fluid intake Improved with free water  administration Patient's oral intake has improved with improvement in her mental status    Hypomagnesemia Supplement magnesium          Consultants: None Procedures performed: None  Disposition: Nursing home Diet recommendation:  Discharge Diet Orders (From admission, onward)     Start     Ordered   04/06/24 0000  Diet - low sodium heart healthy        04/06/24 1012           Cardiac diet DISCHARGE MEDICATION: Allergies as of 04/06/2024       Reactions   Ondansetron   Hcl Other (See Comments)   Severe constipation    Oxycodone     Biliary colic with opioids s/p cholecystectomy     Rituximab  Other (See Comments)   Pt reports throat tightness and facial swelling and redness during infusion of Rituxan         Medication List     PAUSE taking these medications    divalproex  125 MG DR tablet Wait to take this until your doctor or other care provider  tells you to start again. Commonly known as: DEPAKOTE  Take 125 mg by mouth 2 (two) times daily.       STOP taking these medications    LORazepam  0.5 MG tablet Commonly known as: ATIVAN    traMADol  50 MG tablet Commonly known as: ULTRAM    triamcinolone  cream 0.1 % Commonly known as: KENALOG        TAKE these medications    acetaminophen  650 MG CR tablet Commonly known as: TYLENOL  Take 650 mg by mouth every 8 (eight) hours as needed for pain.   apixaban  5 MG Tabs tablet Commonly known as: ELIQUIS  Take 2 tablets (10 mg total) by mouth 2 (two) times daily for 6 days, THEN 1 tablet (5 mg total) 2 (two) times daily for 24 days. Start taking on: December 19, 2023   ARIPiprazole  5 MG tablet Commonly known as: ABILIFY  Take 5 mg by mouth daily.   busPIRone  15 MG tablet Commonly known as: BUSPAR  TAKE 1 TABLET BY MOUTH 3 TIMES DAILY   fesoterodine  4 MG Tb24 tablet Commonly known as: TOVIAZ  Take 1 tablet (4 mg total) by mouth daily.   Menthol -Zinc Oxide 0.44-20.625 % Oint Apply topically 2 (two) times daily.   senna-docusate 8.6-50 MG tablet Commonly known as: Senokot-S Take 2 tablets by mouth 2 (two) times daily.        Follow-up Information     Stacy Murillo Follow up in 1 week(s).   Specialty: Internal Medicine Contact information: [address] [facility] [place] [address] [phone]                Discharge Exam: Stacy Murillo   04/01/24 1717  Weight: 76.8 kg   Appearance: She is chronically ill-appearing. She is not toxic-appearing.     Comments: Awake, alert oriented to person HENT:     Head: Normocephalic and atraumatic.     Nose: Nose normal. No congestion.     Mouth/Throat:     Mouth: Mucous membranes are moist.     Pharynx: Oropharynx is clear.  Eyes:     Extraocular Movements: Extraocular movements intact.     Conjunctiva/sclera: Conjunctivae normal.     Pupils: Pupils are equal, round, and reactive  to light.  Neck:     Vascular: No carotid bruit.  Cardiovascular:     Rate and Rhythm: Normal rate and regular rhythm.     Heart sounds: No murmur heard.    No gallop.  Pulmonary:     Effort: Pulmonary effort is normal. No respiratory distress.     Breath sounds: Normal breath sounds. No wheezing or rales.  Abdominal:     General: Abdomen is flat. Bowel sounds are normal. There is no distension.     Palpations: Abdomen is soft.     Tenderness: There is no abdominal tenderness.  Musculoskeletal:        General: No swelling or tenderness.     Cervical back: Normal range of motion and neck supple. No rigidity or tenderness.  Right lower leg: No edema.     Left lower leg: No edema.  Lymphadenopathy:     Cervical: No cervical adenopathy.  Skin:    General: Skin is warm and dry.     Coloration: Skin is not jaundiced.  Neurological:     General: Unable to assess    Mental Status:  Psychiatric:     Comments: Unable to assess      Condition at discharge: stable  The results of significant diagnostics from this hospitalization (including imaging, microbiology, ancillary and laboratory) are listed below for reference.   Imaging Studies: MR Brain W and Wo Contrast Result Date: 04/02/2024 EXAM: MRI BRAIN WITH AND WITHOUT CONTRAST 04/02/2024 01:51:49 AM TECHNIQUE: Multiplanar multisequence MRI of the head/brain was performed with and without the administration of intravenous contrast. COMPARISON: MRI Head Dec 17, 2023 CLINICAL HISTORY: Neuro deficit, acute, stroke suspected; slurred speech, facial droop x 3 days, h/o lymphoma FINDINGS: BRAIN AND VENTRICLES: No acute infarct. No acute intracranial hemorrhage. Similar remote hemorrhage in left frontal lobe. No mass effect or midline shift. No hydrocephalus. Similar advanced T2 hyperintensity in the white matter. Normal flow voids. No mass or abnormal enhancement. ORBITS: No acute abnormality. SINUSES: No acute abnormality. BONES AND SOFT  TISSUES: Normal bone marrow signal and enhancement. IMPRESSION: 1. Stable MRI without evidence of acute abnormality or residual/recurrent lymphoma. Electronically signed by: Gilmore Molt Murillo 04/02/2024 02:12 AM EST RP Workstation: [id number]   DG Chest Portable 1 View Result Date: 04/02/2024 EXAM: 1 VIEW(S) XRAY OF THE CHEST 04/02/2024 12:24:00 AM COMPARISON: CXR 12/16/2023 CLINICAL HISTORY: cough, AMS FINDINGS: LINES, TUBES AND DEVICES: Tunneled left IJ Port-A-Cath in place with tip overlying the expected region of the confluence of the left brachiocephalic and superior vena cava. LUNGS AND PLEURA: Low lung volumes. No focal pulmonary opacity. No pleural effusion. No pneumothorax. HEART AND MEDIASTINUM: No acute abnormality of the cardiac and mediastinal silhouettes. BONES AND SOFT TISSUES: No acute osseous abnormality. Cholecystectomy clips noted. IMPRESSION: 1. No acute cardiopulmonary process. Electronically signed by: Morgane Naveau Murillo 04/02/2024 12:44 AM EST RP Workstation: [id number]   CT Head Wo Contrast Result Date: 04/01/2024 EXAM: CT HEAD WITHOUT CONTRAST 04/01/2024 06:18:37 PM TECHNIQUE: CT of the head was performed without the administration of intravenous contrast. Automated exposure control, iterative reconstruction, and/or weight based adjustment of the mA/kV was utilized to reduce the radiation dose to as low as reasonably achievable. COMPARISON: 12/16/2023 CLINICAL HISTORY: Altered mental status, nontraumatic (Ped 0-17y). FINDINGS: BRAIN AND VENTRICLES: No acute hemorrhage. No evidence of acute infarct. Proportional prominence of ventricles and sulci, consistent with diffuse cerebral parenchymal volume loss. Extensive periventricular and subcortical white matter hypoattenuation, most consistent with severe chronic ischemic microvascular disease. Calcified atherosclerotic plaque within cavernous/supraclinoid internal carotid arteries. No hydrocephalus. No extra-axial collection. No mass  effect or midline shift. ORBITS: No acute abnormality. SINUSES: No acute abnormality. SOFT TISSUES AND SKULL: No acute soft tissue abnormality. No skull fracture. IMPRESSION: 1. No acute intracranial abnormality. 2. Severe chronic ischemic microvascular disease. 3. Diffuse cerebral parenchymal volume loss. Electronically signed by: Donnice Mania Murillo 04/01/2024 06:51 PM EST RP Workstation: [id number]    Microbiology: Results for orders placed or performed during the hospital encounter of 04/01/24  Resp panel by RT-PCR (RSV, Flu A&B, Covid) Anterior Nasal Swab     Status: None   Collection Time: 04/02/24  1:18 AM   Specimen: Anterior Nasal Swab  Result Value Ref Range Status   SARS Coronavirus 2 by RT PCR NEGATIVE NEGATIVE Final  Comment: (NOTE) SARS-CoV-2 target nucleic acids are NOT DETECTED.  The SARS-CoV-2 RNA is generally detectable in upper respiratory specimens during the acute phase of infection. The lowest concentration of SARS-CoV-2 viral copies this assay can detect is 138 copies/mL. A negative result does not preclude SARS-Cov-2 infection and should not be used as the sole basis for treatment or other patient management decisions. A negative result may occur with  improper specimen collection/handling, submission of specimen other than nasopharyngeal swab, presence of viral mutation(s) within the areas targeted by this assay, and inadequate number of viral copies(<138 copies/mL). A negative result must be combined with clinical observations, patient history, and epidemiological information. The expected result is Negative.  Fact Sheet for Patients:  bloggercourse.com  Fact Sheet for Healthcare Providers:  seriousbroker.it  This test is no t yet approved or cleared by the [place]  FDA and  has been authorized for detection and/or diagnosis of SARS-CoV-2 by FDA under an Emergency Use Authorization (EUA). This EUA will  remain  in effect (meaning this test can be used) for the duration of the COVID-19 declaration under Section 564(b)(1) of the Act, 21 U.S.C.section 360bbb-3(b)(1), unless the authorization is terminated  or revoked sooner.       Influenza A by PCR NEGATIVE NEGATIVE Final   Influenza B by PCR NEGATIVE NEGATIVE Final    Comment: (NOTE) The Xpert Xpress SARS-CoV-2/FLU/RSV plus assay is intended as an aid in the diagnosis of influenza from Nasopharyngeal swab specimens and should not be used as a sole basis for treatment. Nasal washings and aspirates are unacceptable for Xpert Xpress SARS-CoV-2/FLU/RSV testing.  Fact Sheet for Patients: bloggercourse.com  Fact Sheet for Healthcare Providers: seriousbroker.it  This test is not yet approved or cleared by the [place]  FDA and has been authorized for detection and/or diagnosis of SARS-CoV-2 by FDA under an Emergency Use Authorization (EUA). This EUA will remain in effect (meaning this test can be used) for the duration of the COVID-19 declaration under Section 564(b)(1) of the Act, 21 U.S.C. section 360bbb-3(b)(1), unless the authorization is terminated or revoked.     Resp Syncytial Virus by PCR NEGATIVE NEGATIVE Final    Comment: (NOTE) Fact Sheet for Patients: bloggercourse.com  Fact Sheet for Healthcare Providers: seriousbroker.it  This test is not yet approved or cleared by the [place]  FDA and has been authorized for detection and/or diagnosis of SARS-CoV-2 by FDA under an Emergency Use Authorization (EUA). This EUA will remain in effect (meaning this test can be used) for the duration of the COVID-19 declaration under Section 564(b)(1) of the Act, 21 U.S.C. section 360bbb-3(b)(1), unless the authorization is terminated or revoked.  Performed at [facility], [address] [place], [place] [address]    Urine Culture     Status: Abnormal   Collection Time: 04/02/24  2:37 AM   Specimen: Urine, Catheterized  Result Value Ref Range Status   Specimen Description   Final    URINE, CATHETERIZED Performed at [facility], [address] [place], [place] [address]    Special Requests   Final    NONE Performed at [facility], [address] [place], [place] [address]    Culture >=100,000 COLONIES/mL ESCHERICHIA COLI (A)  Final   Report Status 04/04/2024 FINAL  Final   Organism ID, Bacteria ESCHERICHIA COLI (A)  Final      Susceptibility   Escherichia coli - MIC*    AMPICILLIN <=2 SENSITIVE Sensitive     CEFAZOLIN  (URINE) Value in  next row Sensitive      <=1 SENSITIVEThis is a modified FDA-approved test that has been validated and its performance characteristics determined by the reporting laboratory.  This laboratory is certified under the Clinical Laboratory Improvement Amendments CLIA as qualified to perform high complexity clinical laboratory testing.    CEFEPIME  Value in next row Sensitive      <=1 SENSITIVEThis is a modified FDA-approved test that has been validated and its performance characteristics determined by the reporting laboratory.  This laboratory is certified under the Clinical Laboratory Improvement Amendments CLIA as qualified to perform high complexity clinical laboratory testing.    ERTAPENEM Value in next row Sensitive      <=1 SENSITIVEThis is a modified FDA-approved test that has been validated and its performance characteristics determined by the reporting laboratory.  This laboratory is certified under the Clinical Laboratory Improvement Amendments CLIA as qualified to perform high complexity clinical laboratory testing.    CEFTRIAXONE  Value in next row Sensitive      <=1 SENSITIVEThis is a modified FDA-approved test that has been validated and its performance characteristics determined by the reporting laboratory.  This laboratory is certified under  the Clinical Laboratory Improvement Amendments CLIA as qualified to perform high complexity clinical laboratory testing.    CIPROFLOXACIN  Value in next row Sensitive      <=1 SENSITIVEThis is a modified FDA-approved test that has been validated and its performance characteristics determined by the reporting laboratory.  This laboratory is certified under the Clinical Laboratory Improvement Amendments CLIA as qualified to perform high complexity clinical laboratory testing.    GENTAMICIN  Value in next row Sensitive      <=1 SENSITIVEThis is a modified FDA-approved test that has been validated and its performance characteristics determined by the reporting laboratory.  This laboratory is certified under the Clinical Laboratory Improvement Amendments CLIA as qualified to perform high complexity clinical laboratory testing.    NITROFURANTOIN Value in next row Sensitive      <=1 SENSITIVEThis is a modified FDA-approved test that has been validated and its performance characteristics determined by the reporting laboratory.  This laboratory is certified under the Clinical Laboratory Improvement Amendments CLIA as qualified to perform high complexity clinical laboratory testing.    TRIMETH/SULFA Value in next row Sensitive      <=1 SENSITIVEThis is a modified FDA-approved test that has been validated and its performance characteristics determined by the reporting laboratory.  This laboratory is certified under the Clinical Laboratory Improvement Amendments CLIA as qualified to perform high complexity clinical laboratory testing.    AMPICILLIN/SULBACTAM Value in next row Sensitive      <=1 SENSITIVEThis is a modified FDA-approved test that has been validated and its performance characteristics determined by the reporting laboratory.  This laboratory is certified under the Clinical Laboratory Improvement Amendments CLIA as qualified to perform high complexity clinical laboratory testing.    PIP/TAZO Value in next  row Sensitive      <=4 SENSITIVEThis is a modified FDA-approved test that has been validated and its performance characteristics determined by the reporting laboratory.  This laboratory is certified under the Clinical Laboratory Improvement Amendments CLIA as qualified to perform high complexity clinical laboratory testing.    MEROPENEM Value in next row Sensitive      <=4 SENSITIVEThis is a modified FDA-approved test that has been validated and its performance characteristics determined by the reporting laboratory.  This laboratory is certified under the Clinical Laboratory Improvement Amendments CLIA as qualified to perform high complexity clinical laboratory  testing.    * >=100,000 COLONIES/mL ESCHERICHIA COLI    Labs: CBC: Recent Labs  Lab 04/01/24 2015 04/03/24 0455  WBC 10.8* 8.3  HGB 12.3 10.9*  HCT 37.4 33.5*  MCV 87.0 88.2  PLT 196 155   Basic Metabolic Panel: Recent Labs  Lab 04/01/24 1720 04/03/24 0455 04/04/24 0942  NA 141 147* 144  K 4.0 4.0 3.8  CL 102 108 105  CO2 25 29 30   GLUCOSE 88 113* 96  BUN 23 12 6*  CREATININE 0.84 0.82 0.79  CALCIUM  9.6 9.1 9.0  MG  --  1.8  --   PHOS  --  2.8  --    Liver Function Tests: Recent Labs  Lab 04/01/24 1720 04/03/24 0455  AST 15 11*  ALT 12 10  ALKPHOS 77 59  BILITOT 0.5 0.4  PROT 6.4* 4.9*  ALBUMIN 4.1 3.2*   CBG: No results for input(s): GLUCAP in the last 168 hours.  Discharge time spent: greater than 30 minutes.  Signed: Aimee Somerset, [facility] 04/06/2024

## 2024-04-06 NOTE — Plan of Care (Signed)
  Problem: Education: Goal: Knowledge of General Education information will improve Description: Including pain rating scale, medication(s)/side effects and non-pharmacologic comfort measures Outcome: Not Progressing   Problem: Health Behavior/Discharge Planning: Goal: Ability to manage health-related needs will improve Outcome: Not Progressing   Problem: Clinical Measurements: Goal: Ability to maintain clinical measurements within normal limits will improve Outcome: Not Progressing Goal: Will remain free from infection Outcome: Not Progressing Goal: Diagnostic test results will improve Outcome: Not Progressing Goal: Cardiovascular complication will be avoided Outcome: Not Progressing   Problem: Activity: Goal: Risk for activity intolerance will decrease Outcome: Not Progressing   Problem: Nutrition: Goal: Adequate nutrition will be maintained Outcome: Not Progressing   Problem: Coping: Goal: Level of anxiety will decrease Outcome: Not Progressing   Problem: Elimination: Goal: Will not experience complications related to bowel motility Outcome: Not Progressing Goal: Will not experience complications related to urinary retention Outcome: Not Progressing   Problem: Pain Managment: Goal: General experience of comfort will improve and/or be controlled Outcome: Not Progressing   Problem: Safety: Goal: Ability to remain free from injury will improve Outcome: Not Progressing   Problem: Skin Integrity: Goal: Risk for impaired skin integrity will decrease Outcome: Not Progressing

## 2024-04-06 NOTE — Progress Notes (Signed)
 The writer called the facility ,[facility] twice and a answering machine picked up and said the machine is full. The pt shows no s/s of pain or distress at this time. Will continue to monitor until pt is picked up.

## 2024-04-06 NOTE — TOC Transition Note (Signed)
 Transition of Care [facility]) - Discharge Note   Patient Details  Name: Stacy Murillo MRN: [id number] Date of Birth: [birth year]  [facility] CM/SW Contact:  Daved JONETTA Hamilton, RN Phone Number: 04/06/2024, 12:10 PM   Clinical Narrative:     Patient will DC to: [facility] Anticipated DC date: 04/06/2024 Family notified: LVMM for Joseph Leland Transport by: [place]  Per MD patient ready for DC to [facility]. RN, patient, patient's family, and facility notified of DC. Discharge Summary and FL2 printed to unit printer and placed with discharge packet. RN given number for report. DC packet on chart. Ambulance transport requested for patient.   TOC signing off.   Final next level of care: Assisted Living Barriers to Discharge: Barriers Resolved   Patient Goals and CMS Choice            Discharge Placement                Patient to be transferred to facility by: [facility] Name of family member notified: LVMM for Joseph Saiz Patient and family notified of of transfer: 04/06/24  Discharge Plan and Services Additional resources added to the After Visit Summary for   In-house Referral: Clinical Social Work                                   Social Drivers of Health (SDOH) Interventions SDOH Screenings   Food Insecurity: No Food Insecurity (04/02/2024)  Housing: Low Risk  (04/02/2024)  Transportation Needs: No Transportation Needs (04/02/2024)  Utilities: Not At Risk (04/02/2024)  Alcohol Screen: Low Risk  (07/08/2023)  Depression (PHQ2-9): Low Risk  (08/13/2023)  Financial Resource Strain: Low Risk  (01/17/2024)   Received from [facility]  Physical Activity: Inactive (07/08/2023)  Social Connections: Unknown (04/02/2024)  Stress: No Stress Concern Present (07/08/2023)  Tobacco Use: Low Risk  (04/01/2024)  Health Literacy: Inadequate Health Literacy (07/08/2023)     Readmission Risk Interventions     No data to display

## 2024-04-06 NOTE — [place] FL2 (Signed)
 [place]  MEDICAID FL2 LEVEL OF CARE FORM     IDENTIFICATION  Patient Name: Stacy Murillo Birthdate: 1955/12/11 Sex: female Admission Date (Current Location): 04/01/2024  [facility] and [place] Number:  Chiropodist and Address:  [facility], [address] [place], [place] [address]      Provider Number: [id number]  Attending Physician Name and Address:  Lanetta Lingo, MD  Relative Name and Phone Number:  Joseph Crooke [phone]    Current Level of Care: Hospital Recommended Level of Care: Assisted Living Facility Prior Approval Number:    Date Approved/Denied:   PASRR Number:    Discharge Plan: Other (Comment) [facility])    Current Diagnoses: Patient Active Problem List   Diagnosis Date Noted   DKA (diabetic ketoacidosis) (HCC) 04/02/2024   Elevated troponin 04/02/2024   UTI (urinary tract infection) 04/02/2024   History of pulmonary embolism 04/02/2024   Delirium with dementia (HCC) 04/02/2024   Sepsis (HCC) 12/18/2023   Heart block AV complete (HCC) 12/18/2023   Acute metabolic encephalopathy 12/17/2023   Acute hypoxic respiratory failure (HCC) 12/17/2023   PE (pulmonary thromboembolism) (HCC) 12/16/2023   Acute urinary retention 10/18/2023   Femur fracture (HCC) 10/15/2023   CKD (chronic kidney disease), stage III (HCC) 10/15/2023   Seborrheic eczema of scalp 08/13/2023   Port-A-Cath in place 05/17/2022   Generalized weakness    Altered mental status 02/25/2022   Fall at home, initial encounter 02/25/2022   Gait instability 02/25/2022   Dehydration 02/25/2022   COVID-19 05/09/2021   Hypotension, chronic 03/25/2021   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   Primary CNS lymphoma (HCC) 03/08/2019   Goals of care, counseling/discussion 03/08/2019   Lesion of frontal lobe of brain 02/19/2019   Weight gain, abnormal  12/23/2018   Generalized anxiety disorder 02/09/2018   History of autologous stem cell transplant (HCC) 01/15/2018   Immunization due 01/15/2018   Vaginal dryness, menopausal 12/18/2017   Stem cell transplant candidate 05/30/2017   Diverticulitis large intestine w/o perforation or abscess w/bleeding 02/17/2017   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   DVT (deep venous thrombosis) (HCC) 10/27/2015   Generalized headaches 10/27/2015   Oral mucositis due to antineoplastic therapy 10/14/2015   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   Left knee pain 07/20/2014   Esophageal reflux 04/06/2014   Special screening for malignant neoplasms, colon 04/06/2014   Screening for breast cancer 07/10/2011   Osteopenia 07/06/2011   Vitamin D  deficiency 07/06/2011   Hyperlipidemia 07/06/2011   Disorder of bone and cartilage 07/06/2011   Osteoporosis    Overweight 10/19/2008    Orientation RESPIRATION BLADDER Height & Weight     Self  Normal Incontinent Weight: 76.8 kg Height:  5' 3 (160 cm)  BEHAVIORAL SYMPTOMS/MOOD NEUROLOGICAL BOWEL NUTRITION STATUS      Continent Diet (Regular)  AMBULATORY STATUS COMMUNICATION OF NEEDS Skin   Extensive Assist Verbally Bruising (scattered)                       Personal Care Assistance Level of Assistance  Bathing, Feeding, Dressing Bathing Assistance: Maximum assistance Feeding assistance: Limited assistance Dressing Assistance: Maximum assistance     Functional Limitations Info             SPECIAL CARE FACTORS FREQUENCY  Contractures Contractures Info: Not present    Additional Factors Info  Code Status, Allergies Code Status Info: Full Allergies Info: Ondansetron  Hcl, Oxycodone , Rituximab                Discharge Medications:  Allergies as of 04/06/2024         Reactions    Ondansetron  Hcl Other (See Comments)    Severe constipation     Oxycodone        Biliary colic with opioids s/p cholecystectomy      Rituximab  Other (See Comments)    Pt reports throat tightness and facial swelling and redness during infusion of Rituxan             Medication List       PAUSE taking these medications     divalproex  125 MG DR tablet Wait to take this until your doctor or other care provider tells you to start again. Commonly known as: DEPAKOTE  Take 125 mg by mouth 2 (two) times daily.           STOP taking these medications     LORazepam  0.5 MG tablet Commonly known as: ATIVAN     traMADol  50 MG tablet Commonly known as: ULTRAM     triamcinolone  cream 0.1 % Commonly known as: KENALOG            TAKE these medications     acetaminophen  650 MG CR tablet Commonly known as: TYLENOL  Take 650 mg by mouth every 8 (eight) hours as needed for pain.    apixaban  5 MG Tabs tablet Commonly known as: ELIQUIS  Take 2 tablets (10 mg total) by mouth 2 (two) times daily for 6 days, THEN 1 tablet (5 mg total) 2 (two) times daily for 24 days. Start taking on: December 19, 2023    ARIPiprazole  5 MG tablet Commonly known as: ABILIFY  Take 5 mg by mouth daily.    busPIRone  15 MG tablet Commonly known as: BUSPAR  TAKE 1 TABLET BY MOUTH 3 TIMES DAILY    fesoterodine  4 MG Tb24 tablet Commonly known as: TOVIAZ  Take 1 tablet (4 mg total) by mouth daily.    Menthol -Zinc Oxide 0.44-20.625 % Oint Apply topically 2 (two) times daily.    senna-docusate 8.6-50 MG tablet Commonly known as: Senokot-S Take 2 tablets by mouth 2 (two) times daily.    Please see discharge summary for a list of discharge medications.  Relevant Imaging Results:  Relevant Lab Results:   Additional Information SS# [id number]  Daved JONETTA Hamilton, RN

## 2024-07-13 ENCOUNTER — Ambulatory Visit: Payer: BLUE CROSS/BLUE SHIELD
